# Patient Record
Sex: Male | Born: 1950 | Race: Black or African American | Hispanic: No | Marital: Married | Smoking: Never smoker
Health system: Southern US, Community
[De-identification: ages and names within clinical notes are randomized; demographics above are authoritative.]

## PROBLEM LIST (undated history)

## (undated) DIAGNOSIS — E079 Disorder of thyroid, unspecified: Secondary | ICD-10-CM

## (undated) DIAGNOSIS — E039 Hypothyroidism, unspecified: Secondary | ICD-10-CM

## (undated) DIAGNOSIS — H409 Unspecified glaucoma: Secondary | ICD-10-CM

## (undated) DIAGNOSIS — I499 Cardiac arrhythmia, unspecified: Secondary | ICD-10-CM

## (undated) DIAGNOSIS — H548 Legal blindness, as defined in USA: Secondary | ICD-10-CM

## (undated) DIAGNOSIS — I10 Essential (primary) hypertension: Secondary | ICD-10-CM

## (undated) DIAGNOSIS — H5410 Blindness, one eye, low vision other eye, unspecified eyes: Secondary | ICD-10-CM

## (undated) DIAGNOSIS — E119 Type 2 diabetes mellitus without complications: Secondary | ICD-10-CM

## (undated) DIAGNOSIS — E538 Deficiency of other specified B group vitamins: Secondary | ICD-10-CM

## (undated) DIAGNOSIS — K219 Gastro-esophageal reflux disease without esophagitis: Secondary | ICD-10-CM

## (undated) DIAGNOSIS — M1990 Unspecified osteoarthritis, unspecified site: Secondary | ICD-10-CM

## (undated) DIAGNOSIS — G4733 Obstructive sleep apnea (adult) (pediatric): Secondary | ICD-10-CM

## (undated) DIAGNOSIS — E7800 Pure hypercholesterolemia, unspecified: Secondary | ICD-10-CM

## (undated) HISTORY — PX: NOSE SURGERY: SHX723

---

## 2001-01-01 ENCOUNTER — Ambulatory Visit (HOSPITAL_BASED_OUTPATIENT_CLINIC_OR_DEPARTMENT_OTHER): Admission: RE | Admit: 2001-01-01 | Discharge: 2001-01-01 | Payer: Self-pay | Admitting: Pulmonary Disease

## 2001-08-21 ENCOUNTER — Encounter: Admission: RE | Admit: 2001-08-21 | Discharge: 2001-11-19 | Payer: Self-pay | Admitting: Internal Medicine

## 2004-03-28 ENCOUNTER — Ambulatory Visit: Payer: Self-pay | Admitting: Internal Medicine

## 2004-06-27 ENCOUNTER — Ambulatory Visit: Payer: Self-pay | Admitting: Internal Medicine

## 2004-07-08 ENCOUNTER — Ambulatory Visit: Payer: Self-pay | Admitting: Internal Medicine

## 2004-10-10 ENCOUNTER — Ambulatory Visit: Payer: Self-pay | Admitting: Internal Medicine

## 2004-10-12 ENCOUNTER — Ambulatory Visit: Payer: Self-pay | Admitting: Internal Medicine

## 2005-01-10 ENCOUNTER — Ambulatory Visit: Payer: Self-pay | Admitting: Internal Medicine

## 2005-01-17 ENCOUNTER — Ambulatory Visit: Payer: Self-pay | Admitting: Internal Medicine

## 2005-04-18 ENCOUNTER — Ambulatory Visit: Payer: Self-pay | Admitting: Internal Medicine

## 2005-08-03 ENCOUNTER — Ambulatory Visit: Payer: Self-pay | Admitting: Internal Medicine

## 2005-08-04 ENCOUNTER — Ambulatory Visit: Payer: Self-pay | Admitting: Internal Medicine

## 2005-10-31 ENCOUNTER — Ambulatory Visit: Payer: Self-pay | Admitting: Internal Medicine

## 2005-12-07 ENCOUNTER — Ambulatory Visit: Payer: Self-pay | Admitting: Pulmonary Disease

## 2006-02-05 ENCOUNTER — Emergency Department (HOSPITAL_COMMUNITY): Admission: EM | Admit: 2006-02-05 | Discharge: 2006-02-05 | Payer: Self-pay | Admitting: Emergency Medicine

## 2006-02-26 ENCOUNTER — Ambulatory Visit: Payer: Self-pay | Admitting: Internal Medicine

## 2006-05-28 ENCOUNTER — Ambulatory Visit (HOSPITAL_COMMUNITY): Admission: RE | Admit: 2006-05-28 | Discharge: 2006-05-29 | Payer: Self-pay | Admitting: Otolaryngology

## 2006-06-05 ENCOUNTER — Ambulatory Visit: Payer: Self-pay | Admitting: Internal Medicine

## 2006-06-05 LAB — CONVERTED CEMR LAB
ALT: 15 units/L (ref 0–40)
AST: 18 units/L (ref 0–37)
Albumin: 3.1 g/dL — ABNORMAL LOW (ref 3.5–5.2)
Alkaline Phosphatase: 48 units/L (ref 39–117)
GFR calc Af Amer: 81 mL/min
GFR calc non Af Amer: 67 mL/min
Glucose, Bld: 111 mg/dL — ABNORMAL HIGH (ref 70–99)
HDL: 35.2 mg/dL — ABNORMAL LOW (ref >39.0)
Sodium: 142 meq/L (ref 135–145)
Total Bilirubin: 0.4 mg/dL (ref 0.3–1.2)
Total CHOL/HDL Ratio: 3.9
Triglycerides: 82 mg/dL (ref 0–149)
VLDL: 16 mg/dL (ref 0–40)

## 2006-06-07 ENCOUNTER — Ambulatory Visit: Payer: Self-pay | Admitting: Internal Medicine

## 2006-07-19 ENCOUNTER — Ambulatory Visit: Payer: Self-pay | Admitting: Pulmonary Disease

## 2006-09-05 ENCOUNTER — Ambulatory Visit: Payer: Self-pay | Admitting: Internal Medicine

## 2006-09-05 LAB — CONVERTED CEMR LAB
Albumin: 3.1 g/dL — ABNORMAL LOW (ref 3.5–5.2)
Bilirubin, Direct: 0.1 mg/dL (ref 0.0–0.3)
CO2: 32 meq/L (ref 19–32)
Chloride: 106 meq/L (ref 96–112)
Cholesterol: 170 mg/dL (ref 0–200)
Creatinine, Ser: 1 mg/dL (ref 0.4–1.5)
GFR calc Af Amer: 100 mL/min
Glucose, Bld: 97 mg/dL (ref 70–99)
Hgb A1c MFr Bld: 5.9 % (ref 4.6–6.0)
LDL Cholesterol: 120 mg/dL — ABNORMAL HIGH (ref 0–99)
PSA: 1.34 ng/mL (ref 0.10–4.00)
Potassium: 3.8 meq/L (ref 3.5–5.1)
Sodium: 142 meq/L (ref 135–145)
Total Bilirubin: 0.9 mg/dL (ref 0.3–1.2)
Total CHOL/HDL Ratio: 4.6
Triglycerides: 67 mg/dL (ref 0–149)
VLDL: 13 mg/dL (ref 0–40)
Vit D, 1,25-Dihydroxy: 17 — ABNORMAL LOW (ref 20–57)

## 2006-09-18 ENCOUNTER — Ambulatory Visit: Payer: Self-pay | Admitting: Pulmonary Disease

## 2007-03-05 ENCOUNTER — Telehealth: Payer: Self-pay | Admitting: Internal Medicine

## 2007-03-06 ENCOUNTER — Encounter: Payer: Self-pay | Admitting: Internal Medicine

## 2007-03-28 ENCOUNTER — Encounter: Payer: Self-pay | Admitting: Internal Medicine

## 2007-03-28 DIAGNOSIS — E785 Hyperlipidemia, unspecified: Secondary | ICD-10-CM | POA: Insufficient documentation

## 2007-03-28 DIAGNOSIS — G4733 Obstructive sleep apnea (adult) (pediatric): Secondary | ICD-10-CM | POA: Insufficient documentation

## 2007-03-28 DIAGNOSIS — E039 Hypothyroidism, unspecified: Secondary | ICD-10-CM | POA: Insufficient documentation

## 2007-03-28 DIAGNOSIS — I10 Essential (primary) hypertension: Secondary | ICD-10-CM | POA: Insufficient documentation

## 2007-03-28 DIAGNOSIS — G4730 Sleep apnea, unspecified: Secondary | ICD-10-CM | POA: Insufficient documentation

## 2007-03-28 DIAGNOSIS — E119 Type 2 diabetes mellitus without complications: Secondary | ICD-10-CM | POA: Insufficient documentation

## 2007-09-20 ENCOUNTER — Telehealth: Payer: Self-pay | Admitting: Internal Medicine

## 2007-09-23 ENCOUNTER — Telehealth: Payer: Self-pay | Admitting: Internal Medicine

## 2007-11-14 ENCOUNTER — Encounter (INDEPENDENT_AMBULATORY_CARE_PROVIDER_SITE_OTHER): Payer: Self-pay | Admitting: *Deleted

## 2007-11-14 ENCOUNTER — Telehealth (INDEPENDENT_AMBULATORY_CARE_PROVIDER_SITE_OTHER): Payer: Self-pay | Admitting: *Deleted

## 2008-02-26 ENCOUNTER — Encounter: Admission: RE | Admit: 2008-02-26 | Discharge: 2008-02-26 | Payer: Self-pay | Admitting: Family Medicine

## 2008-02-27 ENCOUNTER — Encounter: Admission: RE | Admit: 2008-02-27 | Discharge: 2008-02-27 | Payer: Self-pay | Admitting: Family Medicine

## 2008-09-18 ENCOUNTER — Encounter: Payer: Self-pay | Admitting: Internal Medicine

## 2010-09-23 NOTE — Assessment & Plan Note (Signed)
[facility]                               PULMONARY OFFICE NOTE   NAME:Bass, Scott LEHNEN                      MRN:          [id number]  DATE:12/07/2005                            DOB:          May 28, 1950    SUBJECTIVE:  Scott Bass is sent back by Dr. Posey Rea for further  evaluation of his obstructive sleep apnea.  I initially saw him in 2002  after he was diagnosed with moderate to severe obstructive sleep apnea with  a respiratory disturbance index of 35 events per hour.  The patient was  started on CPAP and asked to return in four weeks for further evaluation.  The patient was never seen again, and now comes back with ongoing symptoms.  The patient was not able to tolerate CPAP secondary to the mask being on his  face.  He states that it was confining for him and interfered with his body  movement during sleep.  He also has been having a lot of nasal congestion  which prevented him from using the CPAP mask on a consistent basis.  This is  also a major problem during the day.  The patient continues to have  significant daytime sleepiness, and his weight has actually gone up since  the last visit.   PHYSICAL EXAMINATION:  GENERAL:  Obese, black male in no acute distress.  VITAL SIGNS:  Blood pressure 110/74, pulse 56, temperature 97.7, weight 233  pounds, O2 saturation on room air is 100%.  HEENT:  Nares reveal near total obstruction secondary to increased  turbinates and septal deviation.  Oropharynx shows elongation of soft palate  and uvula.   IMPRESSION:  Moderate to severe obstructive sleep apnea in 2002 which  currently is untreated.  The patient has gained weight, and therefore,  likely has worsening of his sleep apnea.  I tried to explain to him his best  treatment options are weight loss coupled with CPAP.  He does have  obstructive nares and does not want to use a full face mask which is too  confining for him.  Perhaps if we can  improve his nasal airway, he would do  well with nasal pillows and lower pressure requirements.  This would also  help his symptoms of chronic nasal congestion during the day.  The patient  is agreeable to this approach.   PLAN:  1.  Will refer to [facility] for consideration of nasal surgery.  2.  Once this heals, I would reinitiate CPAP with nasal pillows.  3.  Work on weight loss.                                   Barbaraann Share, MD, FCCP   [facility]/MedQ  DD:  12/07/2005  DT:  12/07/2005  Job #:  [id number]   cc:   Sonda Primes, MD  [facility]

## 2010-09-23 NOTE — Op Note (Signed)
Scott Bass, Scott Bass               ACCOUNT NO.:  [id number]   MEDICAL RECORD NO.:  [id number]          PATIENT TYPE:  OIB   LOCATION:  2550                         FACILITY:  [facility]   PHYSICIAN:  Antony Contras, MD     DATE OF BIRTH:  1950/09/05   DATE OF PROCEDURE:  05/28/2006  DATE OF DISCHARGE:                               OPERATIVE REPORT   PREOPERATIVE DIAGNOSIS:  1. Septal deviation.  2. Turbinate hypertrophy.  3. Obstructive sleep apnea.   POSTOPERATIVE DIAGNOSES:  1. Septal deviation.  2. Turbinate hypertrophy.  3. Obstructive sleep apnea.   PROCEDURE:  1. Septoplasty.  2. Bilateral turbinate submucous resection.   SURGEON:  Antony Contras, MD   ANESTHESIA:  General endotracheal anesthesia.   COMPLICATIONS:  None.   INDICATIONS:  The patient is a 60 year old African-American male with a  5-year history of obstructive sleep apnea.  He was tried on CPAP  initially, but had a difficult time tolerating the mask.  His nose  congests every night.  In the office, he is found to have turbinate  enlargement and deviation of his nasal septum significantly to the left  side.  In order to help him tolerate CPAP therapy, he presents to the  operating room for nasal surgery.   FINDINGS:  The septum deviates significantly to the left side,  obstructing the left nasal passage.  Turbinates are enlarged  bilaterally.   DESCRIPTION OF PROCEDURE:  The patient was identified in the holding  room and informed consent having been obtained, the patient was removed  to the operative suite and put on the operating table in the supine  position.  Anesthesia was induced and the patient was intubated by the  anesthesia team using a GlideScope.  The patient was given intravenous  antibiotics during the case.  The eyes were taped closed and the face  was prepped and draped in the sterile fashion.  Afrin-soaked pledgets  were placed on both sides of the nose for several minutes.  The  pledgets  were then removed.  Nasal hairs were cut.  The septum was then injected  with 1% lidocaine with 1:100,000 of epinephrine on both sides.  A  cheilion incision was then made on the left side using a 15 blade  scalpel through the perichondrium.  A subperichondrial flap was then  elevated using a caudal and then [employer].  This was elevated  back beyond the bony cartilaginous junction.  The cartilage was then  divided using a Freer elevator anteriorly, leaving about a 1.5-cm caudal  strut.  The dorsal strut was left at about 1.5 cm as well.  The Freer  elevator was then used to elevate the soft tissues off the opposite side  of the cartilage and the bony cartilaginous junction was then divided.  The portion of cartilage was then removed.  The subperiosteal flaps were  then elevated posteriorly on both sides and the posterior bone was then  removed in a piecemeal fashion.  Inferior cartilage was removed using a  Takahashi forceps and the inferior bony septum was also removed in  a  piecemeal fashion.  An additional segment of cartilage anteriorly was  removed due to continued deviation, leaving about a 1-cm strut caudally.  This allowed the septum to sit in the midline well.  A large hole was  made in the left septal flap during removal of cartilage and bone.  A  couple of small holes were made on the opposite side that were not  directly across from the large hole.  The inferior turbinates were then  injected with 1% lidocaine with 1:100,000 of epinephrine on both sides.  A 15-blade scalpel was used to make a stab incision at the anterior head  of the turbinate.  The soft tissues were then elevated off the  underlying bone using a Therapist, nutritional on both sides.  The 0-degree  sinus blade was then used on the microdebrider to remove submucosal  tissue on both sides, reducing the turbinates effectively.  The mucosa  was then laid back down on the turbinates.  The nose was then  suctioned  out and Doyle splints coated in bacitracin ointment were placed in both  nasal passages.  The cheilion incision was closed with 4-0 chromic  suture in a simple interrupted fashion.  The stents were then placed  into proper position and secured with a through-and-through 2-0 chromic  mattress stitch.  The nose and throat were then suctioned out and the  patient was then turned back to Anesthesia for wake-up.  He was  extubated and removed to the recovery room in stable condition.      Antony Contras, MD  Electronically Signed     DDB/MEDQ  D:  05/28/2006  T:  05/28/2006  Job:  [id number]

## 2011-01-10 ENCOUNTER — Encounter: Payer: Self-pay | Admitting: Internal Medicine

## 2011-01-13 ENCOUNTER — Ambulatory Visit: Payer: Self-pay | Admitting: Cardiovascular Disease

## 2011-02-02 ENCOUNTER — Ambulatory Visit: Payer: Self-pay | Admitting: Internal Medicine

## 2011-03-01 ENCOUNTER — Ambulatory Visit (HOSPITAL_COMMUNITY): Admission: RE | Admit: 2011-03-01 | Payer: Self-pay | Source: Ambulatory Visit | Admitting: Ophthalmology

## 2013-01-27 ENCOUNTER — Other Ambulatory Visit: Payer: Self-pay | Admitting: Family Medicine

## 2013-01-27 DIAGNOSIS — Z1211 Encounter for screening for malignant neoplasm of colon: Secondary | ICD-10-CM

## 2013-01-31 ENCOUNTER — Other Ambulatory Visit: Payer: Self-pay

## 2013-02-12 ENCOUNTER — Ambulatory Visit
Admission: RE | Admit: 2013-02-12 | Discharge: 2013-02-12 | Disposition: A | Discharge status: Home / Self Care | Payer: BC Managed Care – PPO | Source: Ambulatory Visit | Attending: Family Medicine | Admitting: Family Medicine

## 2013-02-12 ENCOUNTER — Other Ambulatory Visit: Payer: Self-pay | Admitting: Family Medicine

## 2013-02-12 DIAGNOSIS — Z1211 Encounter for screening for malignant neoplasm of colon: Secondary | ICD-10-CM

## 2015-02-16 ENCOUNTER — Emergency Department (HOSPITAL_COMMUNITY): Payer: BLUE CROSS/BLUE SHIELD

## 2015-02-16 ENCOUNTER — Encounter (HOSPITAL_COMMUNITY): Payer: Self-pay | Admitting: Emergency Medicine

## 2015-02-16 ENCOUNTER — Inpatient Hospital Stay (HOSPITAL_COMMUNITY)
Admission: EM | Admit: 2015-02-16 | Discharge: 2015-02-18 | DRG: 440 | Disposition: A | Discharge status: Home / Self Care | Payer: BLUE CROSS/BLUE SHIELD | Attending: Internal Medicine | Admitting: Internal Medicine

## 2015-02-16 DIAGNOSIS — H548 Legal blindness, as defined in USA: Secondary | ICD-10-CM | POA: Diagnosis present

## 2015-02-16 DIAGNOSIS — D649 Anemia, unspecified: Secondary | ICD-10-CM | POA: Diagnosis present

## 2015-02-16 DIAGNOSIS — K8500 Idiopathic acute pancreatitis without necrosis or infection: Secondary | ICD-10-CM | POA: Diagnosis not present

## 2015-02-16 DIAGNOSIS — I10 Essential (primary) hypertension: Secondary | ICD-10-CM | POA: Diagnosis present

## 2015-02-16 DIAGNOSIS — R1013 Epigastric pain: Secondary | ICD-10-CM | POA: Diagnosis not present

## 2015-02-16 DIAGNOSIS — Z9119 Patient's noncompliance with other medical treatment and regimen: Secondary | ICD-10-CM

## 2015-02-16 DIAGNOSIS — K859 Acute pancreatitis without necrosis or infection, unspecified: Secondary | ICD-10-CM | POA: Diagnosis present

## 2015-02-16 DIAGNOSIS — G4733 Obstructive sleep apnea (adult) (pediatric): Secondary | ICD-10-CM | POA: Diagnosis present

## 2015-02-16 DIAGNOSIS — E119 Type 2 diabetes mellitus without complications: Secondary | ICD-10-CM

## 2015-02-16 DIAGNOSIS — E039 Hypothyroidism, unspecified: Secondary | ICD-10-CM | POA: Diagnosis present

## 2015-02-16 HISTORY — DX: Cardiac arrhythmia, unspecified: I49.9

## 2015-02-16 HISTORY — DX: Essential (primary) hypertension: I10

## 2015-02-16 HISTORY — DX: Legal blindness, as defined in USA: H54.8

## 2015-02-16 HISTORY — DX: Disorder of thyroid, unspecified: E07.9

## 2015-02-16 LAB — CBC
HCT: 36.7 % — ABNORMAL LOW (ref 39.0–52.0)
HEMOGLOBIN: 12.2 g/dL — AB (ref 13.0–17.0)
MCH: 28.8 pg (ref 26.0–34.0)
MCHC: 33.2 g/dL (ref 30.0–36.0)
MCV: 86.8 fL (ref 78.0–100.0)
PLATELETS: 299 10*3/uL (ref 150–400)
RBC: 4.23 MIL/uL (ref 4.22–5.81)
RDW: 14.4 % (ref 11.5–15.5)
WBC: 12.6 10*3/uL — AB (ref 4.0–10.5)

## 2015-02-16 LAB — COMPREHENSIVE METABOLIC PANEL
ALK PHOS: 57 U/L (ref 38–126)
ALT: 20 U/L (ref 17–63)
ANION GAP: 11 (ref 5–15)
AST: 26 U/L (ref 15–41)
Albumin: 3.3 g/dL — ABNORMAL LOW (ref 3.5–5.0)
BILIRUBIN TOTAL: 0.9 mg/dL (ref 0.3–1.2)
BUN: 8 mg/dL (ref 6–20)
CALCIUM: 9 mg/dL (ref 8.9–10.3)
CO2: 25 mmol/L (ref 22–32)
Chloride: 99 mmol/L — ABNORMAL LOW (ref 101–111)
Creatinine, Ser: 1.06 mg/dL (ref 0.61–1.24)
Glucose, Bld: 138 mg/dL — ABNORMAL HIGH (ref 65–99)
Potassium: 3.2 mmol/L — ABNORMAL LOW (ref 3.5–5.1)
SODIUM: 135 mmol/L (ref 135–145)
TOTAL PROTEIN: 7.7 g/dL (ref 6.5–8.1)

## 2015-02-16 LAB — LIPASE, BLOOD: Lipase: 80 U/L — ABNORMAL HIGH (ref 22–51)

## 2015-02-16 MED ORDER — MORPHINE SULFATE (PF) 4 MG/ML IV SOLN
4.0000 mg | Freq: Once | INTRAVENOUS | Status: AC
  Administered 2015-02-17: 4 mg via INTRAVENOUS
  Filled 2015-02-16: qty 1

## 2015-02-16 MED ORDER — ONDANSETRON HCL 4 MG/2ML IJ SOLN
4.0000 mg | Freq: Once | INTRAMUSCULAR | Status: AC
  Administered 2015-02-17: 4 mg via INTRAVENOUS
  Filled 2015-02-16: qty 2

## 2015-02-16 MED ORDER — SODIUM CHLORIDE 0.9 % IV BOLUS (SEPSIS)
1000.0000 mL | Freq: Once | INTRAVENOUS | Status: AC
  Administered 2015-02-17: 1000 mL via INTRAVENOUS

## 2015-02-16 NOTE — ED Notes (Signed)
Pt reports mid abdominal pain x 3 days. Sent here to r/o pancreatitis. No bm in 3 days. Denies nv.

## 2015-02-16 NOTE — ED Provider Notes (Signed)
CSN: [id number]     Arrival date & time 02/16/15  1911 History   First MD Initiated Contact with Patient 02/16/15 2305     Chief Complaint  Patient presents with  . Abdominal Pain     (Consider location/radiation/quality/duration/timing/severity/associated sxs/prior Treatment) HPI Comments: Patient is a 64 year old male with a past medical history of thyroid disease, hypertension, and diabetes who presents with abdominal pain that started 3 days ago. The pain is located in the epigastrium and LLQ and does not radiate. The pain is described as aching and severe. The pain started gradually and progressively worsened since the onset. No alleviating/aggravating factors. The patient has tried nothing for symptoms without relief. Associated symptoms include constipation. He reports not having a bowel movement in 3 days. Patient denies fever, headache, NVD, chest pain, SOB, dysuria. No history of abdominal surgery.      Past Medical History  Diagnosis Date  . Thyroid disease   . Hypertension   . Sleep apnea   . Irregular heartbeat   . Diabetes mellitus without complication (HCC)   . Legally blind    Past Surgical History  Procedure Laterality Date  . Nose surgery     No family history on file. Social History  Substance Use Topics  . Smoking status: Never Smoker   . Smokeless tobacco: None  . Alcohol Use: No    Review of Systems  Gastrointestinal: Positive for abdominal pain and constipation.  All other systems reviewed and are negative.     Allergies  Review of patient's allergies indicates no known allergies.  Home Medications   Prior to Admission medications   Not on File   BP 163/88 mmHg  Pulse 96  Temp(Src) 99 F (37.2 C) (Oral)  Resp 18  Ht  (1.702 m)  Wt 220 lb 9 oz (100.046 kg)  BMI 34.54 kg/m2  SpO2 97% Physical Exam  Constitutional: He is oriented to person, place, and time. He appears well-developed and well-nourished. No distress.  HENT:  Head:  Normocephalic and atraumatic.  Eyes: Conjunctivae and EOM are normal.  Neck: Normal range of motion.  Cardiovascular: Normal rate and regular rhythm.  Exam reveals no gallop and no friction rub.   No murmur heard. Pulmonary/Chest: Effort normal and breath sounds normal. He has no wheezes. He has no rales. He exhibits no tenderness.  Abdominal: Soft. He exhibits no distension. There is tenderness. There is no rebound.  Epigastric and LLQ tenderness to palpation. No other focal tenderness or peritoneal signs.   Musculoskeletal: Normal range of motion.  Neurological: He is alert and oriented to person, place, and time. Coordination normal.  Speech is goal-oriented. Moves limbs without ataxia.   Skin: Skin is warm and dry.  Psychiatric: He has a normal mood and affect. His behavior is normal.  Nursing note and vitals reviewed.   ED Course  Procedures (including critical care time) Labs Review Labs Reviewed  LIPASE, BLOOD - Abnormal; Notable for the following:    Lipase 80 (*)    All other components within normal limits  COMPREHENSIVE METABOLIC PANEL - Abnormal; Notable for the following:    Potassium 3.2 (*)    Chloride 99 (*)    Glucose, Bld 138 (*)    Albumin 3.3 (*)    All other components within normal limits  CBC - Abnormal; Notable for the following:    WBC 12.6 (*)    Hemoglobin 12.2 (*)    HCT 36.7 (*)    All  other components within normal limits  URINALYSIS, ROUTINE W REFLEX MICROSCOPIC (NOT AT [facility]) - Abnormal; Notable for the following:    Ketones, ur 40 (*)    All other components within normal limits    Imaging Review Ct Abdomen Pelvis W Contrast  02/17/2015  CLINICAL DATA:  64 year old male with left lower quadrant and epigastric pain. Initial encounter. EXAM: CT ABDOMEN AND PELVIS WITH CONTRAST TECHNIQUE: Multidetector CT imaging of the abdomen and pelvis was performed using the standard protocol following bolus administration of intravenous contrast. CONTRAST:   100 mL Omnipaque 300 COMPARISON:  Barium enema 02/12/2013. FINDINGS: No pericardial or pleural effusion. Minor lower lobe atelectasis or scarring. Degenerative changes in the spine. No acute osseous abnormality identified. No pelvic free fluid. Negative rectum. Early IV contrast excretion at the ureterovesical junctions, otherwise unremarkable urinary bladder. Decompressed sigmoid and left colon. Inflammation from the lesser sac abutting the splenic flexure which otherwise appears negative. Mildly gas and stool distended more proximal colon. Negative appendix. No dilated small bowel. Small gastric hiatal hernia. Largely decompressed stomach and duodenum. Decreased density in the liver in keeping with steatosis. Otherwise negative liver, gallbladder, spleen, adrenal glands. Renal enhancement and contrast excretion within normal limits. Major arterial structures are patent. Minimal iliac artery atherosclerosis. Portal venous system including the splenic vein is patent. No abdominal free air or free fluid. Inflammation surrounding the pancreas, more so the tail and body. Underlying fatty infiltration of the pancreas. Thickening of the left para renal space Foss she a and lateral [employer] show. No fluid collection. IMPRESSION: 1. Acute pancreatitis primarily affecting the body and tail with no complicating features at this time. 2. No other acute process in the abdomen or pelvis. Hepatic steatosis. Electronically Signed   By: Odessa Fleming M.D.   On: 02/17/2015 02:27   I have personally reviewed and evaluated these images and lab results as part of my medical decision-making.   EKG Interpretation None      MDM   Final diagnoses:  Idiopathic acute pancreatitis    11:05 PM Patient's labs show elevated lipase. Remaining labs stable. Vitals stable and patient afebrile.   3:36 AM Patient's CT shows pancreatitis without other acute changes. Patient's pain controlled with morphine. Patient will be admitted for  bowel rest and observation.     Emilia Beck, PA-C 02/17/15 5462  Linwood Dibbles, MD 02/18/15 2207

## 2015-02-17 ENCOUNTER — Encounter (HOSPITAL_COMMUNITY): Payer: Self-pay | Admitting: Internal Medicine

## 2015-02-17 ENCOUNTER — Observation Stay (HOSPITAL_COMMUNITY): Payer: BLUE CROSS/BLUE SHIELD

## 2015-02-17 ENCOUNTER — Inpatient Hospital Stay (HOSPITAL_COMMUNITY): Payer: BLUE CROSS/BLUE SHIELD

## 2015-02-17 DIAGNOSIS — H548 Legal blindness, as defined in USA: Secondary | ICD-10-CM | POA: Diagnosis present

## 2015-02-17 DIAGNOSIS — K8500 Idiopathic acute pancreatitis without necrosis or infection: Secondary | ICD-10-CM | POA: Diagnosis not present

## 2015-02-17 DIAGNOSIS — E119 Type 2 diabetes mellitus without complications: Secondary | ICD-10-CM

## 2015-02-17 DIAGNOSIS — E039 Hypothyroidism, unspecified: Secondary | ICD-10-CM

## 2015-02-17 DIAGNOSIS — D649 Anemia, unspecified: Secondary | ICD-10-CM | POA: Diagnosis present

## 2015-02-17 DIAGNOSIS — R1013 Epigastric pain: Secondary | ICD-10-CM | POA: Diagnosis present

## 2015-02-17 DIAGNOSIS — I10 Essential (primary) hypertension: Secondary | ICD-10-CM | POA: Diagnosis present

## 2015-02-17 DIAGNOSIS — Z9119 Patient's noncompliance with other medical treatment and regimen: Secondary | ICD-10-CM | POA: Diagnosis not present

## 2015-02-17 DIAGNOSIS — G4733 Obstructive sleep apnea (adult) (pediatric): Secondary | ICD-10-CM | POA: Diagnosis present

## 2015-02-17 DIAGNOSIS — K859 Acute pancreatitis without necrosis or infection, unspecified: Secondary | ICD-10-CM | POA: Diagnosis present

## 2015-02-17 LAB — COMPREHENSIVE METABOLIC PANEL
ALK PHOS: 48 U/L (ref 38–126)
ALT: 18 U/L (ref 17–63)
ANION GAP: 14 (ref 5–15)
AST: 22 U/L (ref 15–41)
Albumin: 2.7 g/dL — ABNORMAL LOW (ref 3.5–5.0)
BILIRUBIN TOTAL: 0.8 mg/dL (ref 0.3–1.2)
BUN: 7 mg/dL (ref 6–20)
CALCIUM: 8.8 mg/dL — AB (ref 8.9–10.3)
CO2: 26 mmol/L (ref 22–32)
CREATININE: 1.07 mg/dL (ref 0.61–1.24)
Chloride: 99 mmol/L — ABNORMAL LOW (ref 101–111)
GFR calc non Af Amer: >60 mL/min (ref >60)
GLUCOSE: 132 mg/dL — AB (ref 65–99)
Potassium: 3.4 mmol/L — ABNORMAL LOW (ref 3.5–5.1)
Sodium: 139 mmol/L (ref 135–145)
TOTAL PROTEIN: 7 g/dL (ref 6.5–8.1)

## 2015-02-17 LAB — URINALYSIS, ROUTINE W REFLEX MICROSCOPIC
Bilirubin Urine: NEGATIVE
Glucose, UA: NEGATIVE mg/dL
Hgb urine dipstick: NEGATIVE
KETONES UR: 40 mg/dL — AB
LEUKOCYTES UA: NEGATIVE
Nitrite: NEGATIVE
PROTEIN: NEGATIVE mg/dL
Specific Gravity, Urine: 1.018 (ref 1.005–1.030)
UROBILINOGEN UA: 1 mg/dL (ref 0.0–1.0)
pH: 5.5 (ref 5.0–8.0)

## 2015-02-17 LAB — CBC WITH DIFFERENTIAL/PLATELET
BASOS ABS: 0 10*3/uL (ref 0.0–0.1)
BASOS PCT: 0 %
EOS ABS: 0.1 10*3/uL (ref 0.0–0.7)
Eosinophils Relative: 1 %
HEMATOCRIT: 33.5 % — AB (ref 39.0–52.0)
Hemoglobin: 11.4 g/dL — ABNORMAL LOW (ref 13.0–17.0)
Lymphocytes Relative: 13 %
Lymphs Abs: 1.6 10*3/uL (ref 0.7–4.0)
MCH: 29.8 pg (ref 26.0–34.0)
MCHC: 34 g/dL (ref 30.0–36.0)
MCV: 87.7 fL (ref 78.0–100.0)
MONO ABS: 1.6 10*3/uL — AB (ref 0.1–1.0)
Monocytes Relative: 12 %
NEUTROS ABS: 9.6 10*3/uL — AB (ref 1.7–7.7)
NEUTROS PCT: 74 %
PLATELETS: 248 10*3/uL (ref 150–400)
RBC: 3.82 MIL/uL — ABNORMAL LOW (ref 4.22–5.81)
RDW: 14.7 % (ref 11.5–15.5)
WBC: 12.9 10*3/uL — ABNORMAL HIGH (ref 4.0–10.5)

## 2015-02-17 LAB — GLUCOSE, CAPILLARY
Glucose-Capillary: 112 mg/dL — ABNORMAL HIGH (ref 65–99)
Glucose-Capillary: 125 mg/dL — ABNORMAL HIGH (ref 65–99)
Glucose-Capillary: 124 mg/dL — ABNORMAL HIGH (ref 65–99)
Glucose-Capillary: 116 mg/dL — ABNORMAL HIGH (ref 65–99)

## 2015-02-17 LAB — LIPASE, BLOOD: LIPASE: 53 U/L — AB (ref 22–51)

## 2015-02-17 LAB — LIPID PANEL
CHOLESTEROL: 171 mg/dL (ref 0–200)
HDL: 49 mg/dL (ref >40)
LDL Cholesterol: 109 mg/dL — ABNORMAL HIGH (ref 0–99)
TRIGLYCERIDES: 65 mg/dL (ref <150)
Total CHOL/HDL Ratio: 3.5 RATIO
VLDL: 13 mg/dL (ref 0–40)

## 2015-02-17 MED ORDER — LEVOTHYROXINE SODIUM 150 MCG PO TABS
150.0000 ug | ORAL_TABLET | Freq: Every day | ORAL | Status: DC
  Administered 2015-02-17 – 2015-02-18 (×2): 150 ug via ORAL
  Filled 2015-02-17 (×2): qty 1

## 2015-02-17 MED ORDER — MORPHINE SULFATE (PF) 2 MG/ML IV SOLN
1.0000 mg | INTRAVENOUS | Status: DC | PRN
  Administered 2015-02-17: 1 mg via INTRAVENOUS
  Filled 2015-02-17: qty 1

## 2015-02-17 MED ORDER — ASPIRIN EC 81 MG PO TBEC
81.0000 mg | DELAYED_RELEASE_TABLET | Freq: Every day | ORAL | Status: DC
  Administered 2015-02-17 – 2015-02-18 (×2): 81 mg via ORAL
  Filled 2015-02-17 (×3): qty 1

## 2015-02-17 MED ORDER — LATANOPROST 0.005 % OP SOLN
1.0000 [drp] | Freq: Every day | OPHTHALMIC | Status: DC
  Administered 2015-02-17: 1 [drp] via OPHTHALMIC
  Filled 2015-02-17: qty 2.5

## 2015-02-17 MED ORDER — PANTOPRAZOLE SODIUM 40 MG PO TBEC
40.0000 mg | DELAYED_RELEASE_TABLET | Freq: Every day | ORAL | Status: DC
  Administered 2015-02-17 – 2015-02-18 (×2): 40 mg via ORAL
  Filled 2015-02-17 (×2): qty 1

## 2015-02-17 MED ORDER — CETYLPYRIDINIUM CHLORIDE 0.05 % MT LIQD
7.0000 mL | Freq: Two times a day (BID) | OROMUCOSAL | Status: DC
  Administered 2015-02-17 – 2015-02-18 (×2): 7 mL via OROMUCOSAL

## 2015-02-17 MED ORDER — POTASSIUM CHLORIDE IN NACL 20-0.9 MEQ/L-% IV SOLN
INTRAVENOUS | Status: DC
  Administered 2015-02-17 – 2015-02-18 (×4): via INTRAVENOUS
  Filled 2015-02-17 (×6): qty 1000

## 2015-02-17 MED ORDER — ENOXAPARIN SODIUM 40 MG/0.4ML ~~LOC~~ SOLN
40.0000 mg | SUBCUTANEOUS | Status: DC
  Administered 2015-02-17 – 2015-02-18 (×2): 40 mg via SUBCUTANEOUS
  Filled 2015-02-17 (×2): qty 0.4

## 2015-02-17 MED ORDER — ONDANSETRON HCL 4 MG/2ML IJ SOLN: 4.0000 mg | Freq: Four times a day (QID) | INTRAMUSCULAR | Status: DC | PRN

## 2015-02-17 MED ORDER — ACETAMINOPHEN 325 MG PO TABS: 650.0000 mg | ORAL_TABLET | Freq: Four times a day (QID) | ORAL | Status: DC | PRN

## 2015-02-17 MED ORDER — ACETAMINOPHEN 650 MG RE SUPP: 650.0000 mg | Freq: Four times a day (QID) | RECTAL | Status: DC | PRN

## 2015-02-17 MED ORDER — BRIMONIDINE TARTRATE 0.2 % OP SOLN
1.0000 [drp] | Freq: Every day | OPHTHALMIC | Status: DC
  Administered 2015-02-17: 1 [drp] via OPHTHALMIC
  Filled 2015-02-17: qty 5

## 2015-02-17 MED ORDER — INSULIN ASPART 100 UNIT/ML ~~LOC~~ SOLN: 0.0000 [IU] | Freq: Three times a day (TID) | SUBCUTANEOUS | Status: DC

## 2015-02-17 MED ORDER — SODIUM CHLORIDE 0.9 % IV SOLN
INTRAVENOUS | Status: DC
  Administered 2015-02-17: 07:00:00 via INTRAVENOUS

## 2015-02-17 MED ORDER — LOSARTAN POTASSIUM 50 MG PO TABS
100.0000 mg | ORAL_TABLET | Freq: Every day | ORAL | Status: DC
Start: 2015-02-17 — End: 2015-02-18
  Administered 2015-02-17 – 2015-02-18 (×2): 100 mg via ORAL
  Filled 2015-02-17 (×2): qty 2

## 2015-02-17 MED ORDER — CHLORHEXIDINE GLUCONATE 0.12 % MT SOLN
15.0000 mL | Freq: Two times a day (BID) | OROMUCOSAL | Status: DC
  Administered 2015-02-17 – 2015-02-18 (×3): 15 mL via OROMUCOSAL
  Filled 2015-02-17 (×3): qty 15

## 2015-02-17 MED ORDER — ONDANSETRON HCL 4 MG PO TABS: 4.0000 mg | ORAL_TABLET | Freq: Four times a day (QID) | ORAL | Status: DC | PRN

## 2015-02-17 MED ORDER — IOHEXOL 300 MG/ML  SOLN
100.0000 mL | Freq: Once | INTRAMUSCULAR | Status: AC | PRN
  Administered 2015-02-17: 100 mL via INTRAVENOUS

## 2015-02-17 MED ORDER — METOPROLOL TARTRATE 25 MG PO TABS
25.0000 mg | ORAL_TABLET | Freq: Every day | ORAL | Status: DC
  Administered 2015-02-17 – 2015-02-18 (×2): 25 mg via ORAL
  Filled 2015-02-17 (×2): qty 1

## 2015-02-17 NOTE — ED Notes (Signed)
Patient states that he is still unable to provide sample for urinalysis.

## 2015-02-17 NOTE — Progress Notes (Signed)
Patient arrived on unit via stretcher with nurse tech. Patient alert and oriented x4. Patient oriented to unit, staff and room. Patient's wife at bedside. Skin assessment completed with two RN's, old scabs noted. Patient rates pain 5/10. MD notified. Safety Fall Prevention Plan was given, discussed and signed by patient. Orders have been reviewed and implemented. Call light has been placed within reach. RN will continue to monitor the patient .   Rivka Barbara BSN, RN  Phone Number: [phone]

## 2015-02-17 NOTE — H&P (Signed)
[facility] History and Physical  FABION JEMMOTT PPJ:[id number] DOB: 04-Jun-1950 DOA: 02/16/2015  Referring physician: Ms.Kaitlyn. PCP: Willow Ora, MD  Specialists: None.  Chief Complaint: Abdominal pain.  HPI: Scott Bass is a 64 y.o. male medical history of hypertension, diabetes mellitus, sleep apnea, hypothyroidism and legally blind presents to the ER because of abdominal pain. Patient has been experiencing abdominal pain over the last 3-4 days. Denies any associated nausea vomiting or diarrhea. Pain was initially mostly in the epigastric area but has become more diffuse. Yesterday patient did not eat anything because of the pain. Pain is more of a dull constant pain nonradiating. Labs in the ER shows mildly elevated lipase with CT abdomen and pelvis showing features concerning for pancreatitis. Patient denies any alcohol abuse and has not had any recent medication changes. She has been admitted for acute pancreatitis.   Review of Systems: As presented in the history of presenting illness, rest negative.  Past Medical History  Diagnosis Date  . Thyroid disease   . Hypertension   . Sleep apnea   . Irregular heartbeat   . Diabetes mellitus without complication (HCC)   . Legally blind    Past Surgical History  Procedure Laterality Date  . Nose surgery     Social History:  reports that he has never smoked. He does not have any smokeless tobacco history on file. He reports that he does not drink alcohol or use illicit drugs. Where does patient live home. Can patient participate in ADLs? Yes.  No Known Allergies  Family History:  Family History  Problem Relation Age of Onset  . Diabetes Mellitus II Sister       Prior to Admission medications   Medication Sig Start Date End Date Taking? Authorizing Provider  aspirin 81 MG tablet Take 81 mg by mouth daily.   Yes Historical Provider, MD  brimonidine (ALPHAGAN) 0.15 % ophthalmic solution Place 1 drop into both eyes  at bedtime. 01/17/15  Yes Historical Provider, MD  levothyroxine (SYNTHROID, LEVOTHROID) 150 MCG tablet Take 150 mcg by mouth daily. 12/10/14  Yes Historical Provider, MD  losartan-hydrochlorothiazide (HYZAAR) 100-12.5 MG tablet Take 1 tablet by mouth daily. 01/18/15  Yes Historical Provider, MD  LUMIGAN 0.01 % SOLN Place 1 drop into both eyes at bedtime. 01/18/15  Yes Historical Provider, MD  metoprolol tartrate (LOPRESSOR) 25 MG tablet Take 25 mg by mouth daily. 01/18/15  Yes Historical Provider, MD  omeprazole (PRILOSEC) 40 MG capsule Take 40 mg by mouth daily. 02/24/14  Yes Historical Provider, MD    Physical Exam: Filed Vitals:   02/16/15 1936 02/16/15 2325 02/17/15 0344 02/17/15 0451  BP: 163/88 149/79 152/76 158/73  Pulse: 96 86 81 96  Temp: 99 F (37.2 C) 99.3 F (37.4 C)  98 F (36.7 C)  TempSrc: Oral Oral  Oral  Resp: 18 16 16 18   Height: 5\' 7"  (1.702 m)   5\' 7"  (1.702 m)  Weight: 100.046 kg (220 lb 9 oz)   98.5 kg (217 lb 2.5 oz)  SpO2: 97% 99% 95% 94%     General:  Moderately built and nourished.  Eyes: Anicteric no pallor.  ENT: No discharge from the ears eyes nose and mouth.  Neck: No mass felt.  Cardiovascular: S1 and S2 heard.  Respiratory: No rhonchi or crepitations.  Abdomen: Soft nontender bowel sounds present. No guarding or rigidity.  Skin: No rash.  Musculoskeletal: No edema.  Psychiatric: Appears normal.  Neurologic: Alert awake oriented to time place  and person. Moves all extremities.  Labs on Admission:  Basic Metabolic Panel:  Recent Labs Lab 02/16/15 1938  NA 135  K 3.2*  CL 99*  CO2 25  GLUCOSE 138*  BUN 8  CREATININE 1.06  CALCIUM 9.0   Liver Function Tests:  Recent Labs Lab 02/16/15 1938  AST 26  ALT 20  ALKPHOS 57  BILITOT 0.9  PROT 7.7  ALBUMIN 3.3*    Recent Labs Lab 02/16/15 1938  LIPASE 80*   No results for input(s): AMMONIA in the last 168 hours. CBC:  Recent Labs Lab 02/16/15 1938  WBC 12.6*  HGB  12.2*  HCT 36.7*  MCV 86.8  PLT 299   Cardiac Enzymes: No results for input(s): CKTOTAL, CKMB, CKMBINDEX, TROPONINI in the last 168 hours.  BNP (last 3 results) No results for input(s): BNP in the last 8760 hours.  ProBNP (last 3 results) No results for input(s): PROBNP in the last 8760 hours.  CBG: No results for input(s): GLUCAP in the last 168 hours.  Radiological Exams on Admission: Dg Chest 2 View  02/17/2015  CLINICAL DATA:  Abdominal pain. History of hypertension and diabetes. EXAM: CHEST  2 VIEW COMPARISON:  05/22/2006 FINDINGS: Shallow inspiration with atelectasis in the lung bases. Normal heart size and pulmonary vascularity. No focal airspace disease or consolidation in the lungs. No blunting of costophrenic angles. No pneumothorax. Mediastinal contours appear intact. Degenerative changes in the spine and shoulders. IMPRESSION: Shallow inspiration with atelectasis in the lung bases. Electronically Signed   By: Burman Nieves M.D.   On: 02/17/2015 04:12   Ct Abdomen Pelvis W Contrast  02/17/2015  CLINICAL DATA:  64 year old male with left lower quadrant and epigastric pain. Initial encounter. EXAM: CT ABDOMEN AND PELVIS WITH CONTRAST TECHNIQUE: Multidetector CT imaging of the abdomen and pelvis was performed using the standard protocol following bolus administration of intravenous contrast. CONTRAST:  100 mL Omnipaque 300 COMPARISON:  Barium enema 02/12/2013. FINDINGS: No pericardial or pleural effusion. Minor lower lobe atelectasis or scarring. Degenerative changes in the spine. No acute osseous abnormality identified. No pelvic free fluid. Negative rectum. Early IV contrast excretion at the ureterovesical junctions, otherwise unremarkable urinary bladder. Decompressed sigmoid and left colon. Inflammation from the lesser sac abutting the splenic flexure which otherwise appears negative. Mildly gas and stool distended more proximal colon. Negative appendix. No dilated small bowel.  Small gastric hiatal hernia. Largely decompressed stomach and duodenum. Decreased density in the liver in keeping with steatosis. Otherwise negative liver, gallbladder, spleen, adrenal glands. Renal enhancement and contrast excretion within normal limits. Major arterial structures are patent. Minimal iliac artery atherosclerosis. Portal venous system including the splenic vein is patent. No abdominal free air or free fluid. Inflammation surrounding the pancreas, more so the tail and body. Underlying fatty infiltration of the pancreas. Thickening of the left para renal space Foss she a and lateral [employer] show. No fluid collection. IMPRESSION: 1. Acute pancreatitis primarily affecting the body and tail with no complicating features at this time. 2. No other acute process in the abdomen or pelvis. Hepatic steatosis. Electronically Signed   By: Odessa Fleming M.D.   On: 02/17/2015 02:27     Assessment/Plan Principal Problem:   Pancreatitis, acute Active Problems:   Hypothyroidism   Pancreatitis   Diabetes mellitus type 2, controlled (HCC)   Essential hypertension   Acute pancreatitis   1. Acute pancreatitis - cause not clear. Will check lipid panel for triglycerides. CT scan does not show any definite  gallstones. Patient does not drink alcohol. Will hold off hydrochlorothiazide for now as possible cause. I have place patient nothing by mouth except medications. Continue with hydration. Pain relieving medications. 2. Hypertension - continue ARB. Holding HCTZ due to possible cause for pancreatitis. 3. Diabetes mellitus type 2 - [facility]. Patient is on sliding scale coverage. 4. Hypothyroidism on Synthroid. 5. OSA - patient states he is noncompliant with his sleep apnea. 6. Normocytic normochromic anemia - will need further workup as outpatient.  I have personally reviewed patient's chest x-ray.   DVT Prophylaxis Lovenox.  Code Status: Full code.  Family Communication: Patient's  wife at the bedside.  Disposition Plan: Admit to inpatient.    Bonnell Placzek N. [facility] Pager [phone].  If 7PM-7AM, please contact night-coverage [email or web address] Password [email or web address] 02/17/2015, 6:11 AM

## 2015-02-17 NOTE — ED Notes (Signed)
Patient transported to X-ray 

## 2015-02-17 NOTE — Progress Notes (Signed)
Utilization review completed. Giabella Duhart, RN, BSN. 

## 2015-02-17 NOTE — Progress Notes (Signed)
Patient seen and examined  64 year old male with a history of hypertension, diabetes, sleep apnea, hypothyroidism admitted with abdominal pain CT scan confirms pancreatitis Minimal abdominal pain this morning will advance to clear liquids Right upper quadrant ultrasound to rule out gallstones Triglycerides okay Replete potassium

## 2015-02-18 LAB — CBC
HEMATOCRIT: 33.9 % — AB (ref 39.0–52.0)
Hemoglobin: 11.3 g/dL — ABNORMAL LOW (ref 13.0–17.0)
MCH: 29.5 pg (ref 26.0–34.0)
MCHC: 33.3 g/dL (ref 30.0–36.0)
MCV: 88.5 fL (ref 78.0–100.0)
PLATELETS: 246 10*3/uL (ref 150–400)
RBC: 3.83 MIL/uL — AB (ref 4.22–5.81)
RDW: 14.8 % (ref 11.5–15.5)
WBC: 10.3 10*3/uL (ref 4.0–10.5)

## 2015-02-18 LAB — COMPREHENSIVE METABOLIC PANEL
ALBUMIN: 2.5 g/dL — AB (ref 3.5–5.0)
ALK PHOS: 49 U/L (ref 38–126)
ALT: 15 U/L — ABNORMAL LOW (ref 17–63)
AST: 21 U/L (ref 15–41)
Anion gap: 12 (ref 5–15)
BILIRUBIN TOTAL: 1.1 mg/dL (ref 0.3–1.2)
BUN: 7 mg/dL (ref 6–20)
CO2: 24 mmol/L (ref 22–32)
CREATININE: 1.05 mg/dL (ref 0.61–1.24)
Calcium: 8.5 mg/dL — ABNORMAL LOW (ref 8.9–10.3)
Chloride: 104 mmol/L (ref 101–111)
GFR calc Af Amer: >60 mL/min (ref >60)
GFR calc non Af Amer: >60 mL/min (ref >60)
Glucose, Bld: 108 mg/dL — ABNORMAL HIGH (ref 65–99)
POTASSIUM: 3.6 mmol/L (ref 3.5–5.1)
Sodium: 140 mmol/L (ref 135–145)
Total Protein: 6.8 g/dL (ref 6.5–8.1)

## 2015-02-18 LAB — LIPASE, BLOOD: LIPASE: 30 U/L (ref 22–51)

## 2015-02-18 LAB — GLUCOSE, CAPILLARY
GLUCOSE-CAPILLARY: 101 mg/dL — AB (ref 65–99)
Glucose-Capillary: 112 mg/dL — ABNORMAL HIGH (ref 65–99)

## 2015-02-18 MED ORDER — LOSARTAN POTASSIUM 100 MG PO TABS: 100.0000 mg | ORAL_TABLET | Freq: Every day | ORAL | Status: DC

## 2015-02-18 NOTE — Care Management Note (Signed)
Case Management Note  Patient Details  Name: Scott Bass MRN: [id number] Date of Birth: 03/24/1951  Subjective/Objective:      CM following for progression and d/c planning.              Action/Plan: No d/c needs identified, pt will d/c to home.  Expected Discharge Date:      02/18/2015            Expected Discharge Plan:  Home/Self Care  In-House Referral:  NA  Discharge planning Services  NA  Post Acute Care Choice:  NA Choice offered to:  NA  DME Arranged:  N/A DME Agency:  NA  HH Arranged:  NA HH Agency:  NA  Status of Service:  Completed, signed off  Medicare Important Message Given:    Date Medicare IM Given:    Medicare IM give by:    Date Additional Medicare IM Given:    Additional Medicare Important Message give by:     If discussed at Long Length of Stay Meetings, dates discussed:    Additional Comments:  Starlyn Skeans, RN 02/18/2015, 11:54 AM

## 2015-02-18 NOTE — Progress Notes (Signed)
Patient Discharge:  Disposition: Pt discharged home to family  Education: Pt educated on medications and all discharge instructions. Pt verbalized understanding.   IV: Removed  Telemetry:  N/A  Follow-up appointments:N/A  Prescriptions: Scripts sent to pt's pharmacy  Transportation: Pt took taxi home  Belongings:All belongings taken with pt.

## 2015-02-18 NOTE — Discharge Summary (Signed)
Physician Discharge Summary  Scott Bass MRN: [id number] DOB/AGE: 12-25-1950 64 y.o.  PCP: Leamon Arnt, MD   Admit date: 02/16/2015 Discharge date: 02/18/2015  Discharge Diagnoses:   Principal Problem:   Pancreatitis, acute Active Problems:   Hypothyroidism   Pancreatitis   Diabetes mellitus type 2, controlled ([place])   Essential hypertension   Acute pancreatitis    Follow-up recommendations Follow-up with PCP in 3-5 days , including all  additional recommended appointments as below Follow-up CBC, CMP in 3-5 days      Medication List    STOP taking these medications        losartan-hydrochlorothiazide 100-12.5 MG tablet  Commonly known as:  HYZAAR      TAKE these medications        aspirin 81 MG tablet  Take 81 mg by mouth daily.     brimonidine 0.15 % ophthalmic solution  Commonly known as:  ALPHAGAN  Place 1 drop into both eyes at bedtime.     levothyroxine 150 MCG tablet  Commonly known as:  SYNTHROID, LEVOTHROID  Take 150 mcg by mouth daily.     losartan 100 MG tablet  Commonly known as:  COZAAR  Take 1 tablet (100 mg total) by mouth daily.     LUMIGAN 0.01 % Soln  Generic drug:  bimatoprost  Place 1 drop into both eyes at bedtime.     metoprolol tartrate 25 MG tablet  Commonly known as:  LOPRESSOR  Take 25 mg by mouth daily.     omeprazole 40 MG capsule  Commonly known as:  PRILOSEC  Take 40 mg by mouth daily.         Discharge Condition: Stable   Home Disposition:    Consults:  None    Significant Diagnostic Studies:  Dg Chest 2 View  02/17/2015  CLINICAL DATA:  Abdominal pain. History of hypertension and diabetes. EXAM: CHEST  2 VIEW COMPARISON:  05/22/2006 FINDINGS: Shallow inspiration with atelectasis in the lung bases. Normal heart size and pulmonary vascularity. No focal airspace disease or consolidation in the lungs. No blunting of costophrenic angles. No pneumothorax. Mediastinal contours appear intact.  Degenerative changes in the spine and shoulders. IMPRESSION: Shallow inspiration with atelectasis in the lung bases. Electronically Signed   By: Lucienne Capers M.D.   On: 02/17/2015 04:12   Ct Abdomen Pelvis W Contrast  02/17/2015  CLINICAL DATA:  64 year old male with left left lower quadrant and epigastric pain. Initial encounter. EXAM: CT ABDOMEN AND PELVIS WITH CONTRAST TECHNIQUE: Multidetector CT imaging of the abdomen and pelvis was performed using the standard protocol following bolus administration of intravenous contrast. CONTRAST:  100 mL Omnipaque 300 COMPARISON:  Barium enema 02/12/2013. FINDINGS: No pericardial or pleural effusion. Minor lower lobe atelectasis or scarring. Degenerative changes in the spine. No acute osseous abnormality identified. No pelvic free fluid. Negative rectum. Early IV contrast excretion at the ureterovesical junctions, otherwise unremarkable urinary bladder. Decompressed sigmoid and left colon. Inflammation from the lesser sac abutting the splenic flexure which otherwise appears negative. Mildly gas and stool distended more proximal colon. Negative appendix. No dilated small bowel. Small gastric hiatal hernia. Largely decompressed stomach and duodenum. Decreased density in the liver in keeping with steatosis. Otherwise negative liver, gallbladder, spleen, adrenal glands. Renal enhancement and contrast excretion within normal limits. Major arterial structures are patent. Minimal iliac artery atherosclerosis. Portal venous system including the splenic vein is patent. No abdominal free air or free fluid. Inflammation surrounding the pancreas, more so the tail and  body. Underlying fatty infiltration of the pancreas. Thickening of the left para renal space Foss she a and lateral [employer] show. No fluid collection. IMPRESSION: 1. Acute pancreatitis primarily affecting the body and tail with no complicating features at this time. 2. No other acute process in the abdomen or pelvis.  Hepatic steatosis. Electronically Signed   By: Genevie Ann M.D.   On: 02/17/2015 02:27   US Abdomen Limited Ruq  02/17/2015  CLINICAL DATA:  Acute pancreatitis.  Mid abdominal pain for 3 days. EXAM: US ABDOMEN LIMITED - RIGHT UPPER QUADRANT COMPARISON:  CT abdomen and pelvis 02/17/2015 FINDINGS: Gallbladder: Gallbladder is somewhat contracted. This is likely physiologic. No gallstones or wall thickening visualized. No sonographic Murphy sign noted. Common bile duct: Diameter: 5.6 mm, normal Liver: Incomplete visualization due to overlying rib shadowing. No focal lesion identified. Within normal limits in parenchymal echogenicity. IMPRESSION: No evidence of cholelithiasis or cholecystitis. No bile duct dilatation. Electronically Signed   By: Lucienne Capers M.D.   On: 02/17/2015 21:10        Filed Weights   02/16/15 1936 02/17/15 0451 02/17/15 2151  Weight: 100.046 kg (220 lb 9 oz) 98.5 kg (217 lb 2.5 oz) 99.9 kg (220 lb 3.8 oz)     Microbiology: No results found for this or any previous visit (from the past 240 hour(s)).     Blood Culture No results found for: SDES, SPECREQUEST, CULT, REPTSTATUS    Labs: Results for orders placed or performed during the hospital encounter of 02/16/15 (from the past 48 hour(s))  Lipase, blood     Status: Abnormal   Collection Time: 02/16/15  7:38 PM  Result Value Ref Range   Lipase 80 (H) 22 - 51 U/L  Comprehensive metabolic panel     Status: Abnormal   Collection Time: 02/16/15  7:38 PM  Result Value Ref Range   Sodium 135 135 - 145 mmol/L   Potassium 3.2 (L) 3.5 - 5.1 mmol/L   Chloride 99 (L) 101 - 111 mmol/L   CO2 25 22 - 32 mmol/L   Glucose, Bld 138 (H) 65 - 99 mg/dL   BUN 8 6 - 20 mg/dL   Creatinine, Ser 1.06 0.61 - 1.24 mg/dL   Calcium 9.0 8.9 - 10.3 mg/dL   Total Protein 7.7 6.5 - 8.1 g/dL   Albumin 3.3 (L) 3.5 - 5.0 g/dL   AST 26 15 - 41 U/L   ALT 20 17 - 63 U/L   Alkaline Phosphatase 57 38 - 126 U/L   Total Bilirubin 0.9 0.3 -  1.2 mg/dL   GFR calc non Af Amer >60 >60 mL/min   GFR calc Af Amer >60 >60 mL/min    Comment: (NOTE) The eGFR has been calculated using the CKD EPI equation. This calculation has not been validated in all clinical situations. eGFR's persistently <60 mL/min signify possible Chronic Kidney Disease.    Anion gap 11 5 - 15  CBC     Status: Abnormal   Collection Time: 02/16/15  7:38 PM  Result Value Ref Range   WBC 12.6 (H) 4.0 - 10.5 K/uL   RBC 4.23 4.22 - 5.81 MIL/uL   Hemoglobin 12.2 (L) 13.0 - 17.0 g/dL   HCT 36.7 (L) 39.0 - 52.0 %   MCV 86.8 78.0 - 100.0 fL   MCH 28.8 26.0 - 34.0 pg   MCHC 33.2 30.0 - 36.0 g/dL   RDW 14.4 11.5 - 15.5 %   Platelets 299 150 - 400  K/uL  Urinalysis, Routine w reflex microscopic (not at [facility])     Status: Abnormal   Collection Time: 02/17/15 12:21 AM  Result Value Ref Range   Color, Urine YELLOW YELLOW   APPearance CLEAR CLEAR   Specific Gravity, Urine 1.018 1.005 - 1.030   pH 5.5 5.0 - 8.0   Glucose, UA NEGATIVE NEGATIVE mg/dL   Hgb urine dipstick NEGATIVE NEGATIVE   Bilirubin Urine NEGATIVE NEGATIVE   Ketones, ur 40 (A) NEGATIVE mg/dL   Protein, ur NEGATIVE NEGATIVE mg/dL   Urobilinogen, UA 1.0 0.0 - 1.0 mg/dL   Nitrite NEGATIVE NEGATIVE   Leukocytes, UA NEGATIVE NEGATIVE    Comment: MICROSCOPIC NOT DONE ON URINES WITH NEGATIVE PROTEIN, BLOOD, LEUKOCYTES, NITRITE, OR GLUCOSE <1000 mg/dL.  Glucose, capillary     Status: Abnormal   Collection Time: 02/17/15  6:27 AM  Result Value Ref Range   Glucose-Capillary 112 (H) 65 - 99 mg/dL  Comprehensive metabolic panel     Status: Abnormal   Collection Time: 02/17/15  8:16 AM  Result Value Ref Range   Sodium 139 135 - 145 mmol/L   Potassium 3.4 (L) 3.5 - 5.1 mmol/L   Chloride 99 (L) 101 - 111 mmol/L   CO2 26 22 - 32 mmol/L   Glucose, Bld 132 (H) 65 - 99 mg/dL   BUN 7 6 - 20 mg/dL   Creatinine, Ser 1.07 0.61 - 1.24 mg/dL   Calcium 8.8 (L) 8.9 - 10.3 mg/dL   Total Protein 7.0 6.5 - 8.1 g/dL    Albumin 2.7 (L) 3.5 - 5.0 g/dL   AST 22 15 - 41 U/L   ALT 18 17 - 63 U/L   Alkaline Phosphatase 48 38 - 126 U/L   Total Bilirubin 0.8 0.3 - 1.2 mg/dL   GFR calc non Af Amer >60 >60 mL/min   GFR calc Af Amer >60 >60 mL/min    Comment: (NOTE) The eGFR has been calculated using the CKD EPI equation. This calculation has not been validated in all clinical situations. eGFR's persistently <60 mL/min signify possible Chronic Kidney Disease.    Anion gap 14 5 - 15  CBC WITH DIFFERENTIAL     Status: Abnormal   Collection Time: 02/17/15  8:16 AM  Result Value Ref Range   WBC 12.9 (H) 4.0 - 10.5 K/uL   RBC 3.82 (L) 4.22 - 5.81 MIL/uL   Hemoglobin 11.4 (L) 13.0 - 17.0 g/dL   HCT 33.5 (L) 39.0 - 52.0 %   MCV 87.7 78.0 - 100.0 fL   MCH 29.8 26.0 - 34.0 pg   MCHC 34.0 30.0 - 36.0 g/dL   RDW 14.7 11.5 - 15.5 %   Platelets 248 150 - 400 K/uL   Neutrophils Relative % 74 %   Neutro Abs 9.6 (H) 1.7 - 7.7 K/uL   Lymphocytes Relative 13 %   Lymphs Abs 1.6 0.7 - 4.0 K/uL   Monocytes Relative 12 %   Monocytes Absolute 1.6 (H) 0.1 - 1.0 K/uL   Eosinophils Relative 1 %   Eosinophils Absolute 0.1 0.0 - 0.7 K/uL   Basophils Relative 0 %   Basophils Absolute 0.0 0.0 - 0.1 K/uL  Lipase, blood     Status: Abnormal   Collection Time: 02/17/15  8:16 AM  Result Value Ref Range   Lipase 53 (H) 22 - 51 U/L  Lipid panel     Status: Abnormal   Collection Time: 02/17/15  8:16 AM  Result Value Ref Range  Cholesterol 171 0 - 200 mg/dL   Triglycerides 65 <150 mg/dL   HDL 49 >40 mg/dL   Total CHOL/HDL Ratio 3.5 RATIO   VLDL 13 0 - 40 mg/dL   LDL Cholesterol 109 (H) 0 - 99 mg/dL    Comment:        Total Cholesterol/HDL:CHD Risk Coronary Heart Disease Risk Table                     Men   Women  1/2 Average Risk   3.4   3.3  Average Risk       5.0   4.4  2 X Average Risk   9.6   7.1  3 X Average Risk  23.4   11.0        Use the calculated Patient Ratio above and the CHD Risk Table to determine the  patient's CHD Risk.        ATP III CLASSIFICATION (LDL):  <100     mg/dL   Optimal  100-129  mg/dL   Near or Above                    Optimal  130-159  mg/dL   Borderline  160-189  mg/dL   High  >190     mg/dL   Very High   Glucose, capillary     Status: Abnormal   Collection Time: 02/17/15 11:40 AM  Result Value Ref Range   Glucose-Capillary 125 (H) 65 - 99 mg/dL  Glucose, capillary     Status: Abnormal   Collection Time: 02/17/15  5:21 PM  Result Value Ref Range   Glucose-Capillary 124 (H) 65 - 99 mg/dL  Glucose, capillary     Status: Abnormal   Collection Time: 02/17/15  9:56 PM  Result Value Ref Range   Glucose-Capillary 116 (H) 65 - 99 mg/dL  Comprehensive metabolic panel     Status: Abnormal   Collection Time: 02/18/15  6:45 AM  Result Value Ref Range   Sodium 140 135 - 145 mmol/L   Potassium 3.6 3.5 - 5.1 mmol/L   Chloride 104 101 - 111 mmol/L   CO2 24 22 - 32 mmol/L   Glucose, Bld 108 (H) 65 - 99 mg/dL   BUN 7 6 - 20 mg/dL   Creatinine, Ser 1.05 0.61 - 1.24 mg/dL   Calcium 8.5 (L) 8.9 - 10.3 mg/dL   Total Protein 6.8 6.5 - 8.1 g/dL   Albumin 2.5 (L) 3.5 - 5.0 g/dL   AST 21 15 - 41 U/L   ALT 15 (L) 17 - 63 U/L   Alkaline Phosphatase 49 38 - 126 U/L   Total Bilirubin 1.1 0.3 - 1.2 mg/dL   GFR calc non Af Amer >60 >60 mL/min   GFR calc Af Amer >60 >60 mL/min    Comment: (NOTE) The eGFR has been calculated using the CKD EPI equation. This calculation has not been validated in all clinical situations. eGFR's persistently <60 mL/min signify possible Chronic Kidney Disease.    Anion gap 12 5 - 15  Lipase, blood     Status: None   Collection Time: 02/18/15  6:45 AM  Result Value Ref Range   Lipase 30 22 - 51 U/L  CBC     Status: Abnormal   Collection Time: 02/18/15  6:45 AM  Result Value Ref Range   WBC 10.3 4.0 - 10.5 K/uL   RBC 3.83 (L) 4.22 - 5.81 MIL/uL   Hemoglobin  11.3 (L) 13.0 - 17.0 g/dL   HCT 33.9 (L) 39.0 - 52.0 %   MCV 88.5 78.0 - 100.0 fL   MCH  29.5 26.0 - 34.0 pg   MCHC 33.3 30.0 - 36.0 g/dL   RDW 14.8 11.5 - 15.5 %   Platelets 246 150 - 400 K/uL  Glucose, capillary     Status: Abnormal   Collection Time: 02/18/15  8:07 AM  Result Value Ref Range   Glucose-Capillary 112 (H) 65 - 99 mg/dL  Glucose, capillary     Status: Abnormal   Collection Time: 02/18/15 12:07 PM  Result Value Ref Range   Glucose-Capillary 101 (H) 65 - 99 mg/dL     Lipid Panel     Component Value Date/Time   CHOL 171 02/17/2015 0816   TRIG 65 02/17/2015 0816   HDL 49 02/17/2015 0816   CHOLHDL 3.5 02/17/2015 0816   VLDL 13 02/17/2015 0816   LDLCALC 109* 02/17/2015 0816     Lab Results  Component Value Date   HGBA1C 5.9 09/05/2006   HGBA1C 5.9 06/05/2006     Lab Results  Component Value Date   LDLCALC 109* 02/17/2015   CREATININE 1.05 02/18/2015     HPI :Scott Bass is a 64 y.o. male medical history of hypertension, diabetes mellitus, sleep apnea, hypothyroidism and legally blind presents to the ER because of abdominal pain. Patient has been experiencing abdominal pain over the last 3-4 days. Denies any associated nausea vomiting or diarrhea. Pain was initially mostly in the epigastric area but has become more diffuse. Yesterday patient did not eat anything because of the pain. Pain is more of a dull constant pain nonradiating. Labs in the ER shows mildly elevated lipase with CT abdomen and pelvis showing features concerning for pancreatitis. Patient denies any alcohol abuse and has not had any recent medication changes. She has been admitted for acute pancreatitis.    HOSPITAL COURSE:    Acute pancreatitis - cause not clear. Could be secondary to hydrochlorothiazide, triglyceride level was 65, right upper quadrant ultrasound was negative. CT scan does not show any definite gallstones. Patient does not drink alcohol. Will hold off hydrochlorothiazide for now as possible cause. Repeat lipase is normal, patient denies any significant pain,  tolerating soft diet, status post aggressive IV fluids, now would like to go home. Patient advised to stop HCTZ  Hypertension - continue ARB. Stop HCTZ due to possible cause for pancreatitis.  Diabetes mellitus type 2 -   would advise PCP to check hemoglobin A1c, CBG control during this admission  Hypothyroidism on Synthroid.  OSA - patient states he is noncompliant with his sleep apnea.  Normocytic normochromic anemia - will need further workup as outpatient.   Discharge Exam:    Blood pressure 159/89, pulse 95, temperature 98.7 F (37.1 C), temperature source Oral, resp. rate 18, height _0  (1.702 m), weight 99.9 kg (220 lb 3.8 oz), SpO2 98 %.   General: Moderately built and nourished.  Eyes: Anicteric no pallor.  ENT: No discharge from the ears eyes nose and mouth.  Neck: No mass felt.  Cardiovascular: S1 and S2 heard.  Respiratory: No rhonchi or crepitations.  Abdomen: Soft nontender bowel sounds present. No guarding or rigidity.  Skin: No rash.  Musculoskeletal: No edema.  Psychiatric: Appears normal.  Neurologic: Alert awake oriented to time place and person. Moves all extremities.       Discharge Instructions    Diet - low sodium heart healthy  Complete by:  As directed      Increase activity slowly    Complete by:  As directed              Signed: Wylie Coon 02/18/2015, 12:31 PM        Time spent >45 mins

## 2015-04-24 ENCOUNTER — Emergency Department (HOSPITAL_COMMUNITY)
Admission: EM | Admit: 2015-04-24 | Discharge: 2015-04-24 | Disposition: A | Discharge status: Home / Self Care | Payer: BLUE CROSS/BLUE SHIELD | Attending: Emergency Medicine | Admitting: Emergency Medicine

## 2015-04-24 ENCOUNTER — Encounter (HOSPITAL_COMMUNITY): Payer: Self-pay | Admitting: Emergency Medicine

## 2015-04-24 DIAGNOSIS — K122 Cellulitis and abscess of mouth: Secondary | ICD-10-CM | POA: Diagnosis not present

## 2015-04-24 DIAGNOSIS — Z8669 Personal history of other diseases of the nervous system and sense organs: Secondary | ICD-10-CM | POA: Diagnosis not present

## 2015-04-24 DIAGNOSIS — E119 Type 2 diabetes mellitus without complications: Secondary | ICD-10-CM | POA: Insufficient documentation

## 2015-04-24 DIAGNOSIS — Z79899 Other long term (current) drug therapy: Secondary | ICD-10-CM | POA: Insufficient documentation

## 2015-04-24 DIAGNOSIS — H548 Legal blindness, as defined in USA: Secondary | ICD-10-CM | POA: Diagnosis not present

## 2015-04-24 DIAGNOSIS — E079 Disorder of thyroid, unspecified: Secondary | ICD-10-CM | POA: Insufficient documentation

## 2015-04-24 DIAGNOSIS — I10 Essential (primary) hypertension: Secondary | ICD-10-CM | POA: Diagnosis not present

## 2015-04-24 DIAGNOSIS — J029 Acute pharyngitis, unspecified: Secondary | ICD-10-CM | POA: Diagnosis present

## 2015-04-24 DIAGNOSIS — Z7982 Long term (current) use of aspirin: Secondary | ICD-10-CM | POA: Insufficient documentation

## 2015-04-24 MED ORDER — DEXAMETHASONE SODIUM PHOSPHATE 10 MG/ML IJ SOLN
10.0000 mg | Freq: Once | INTRAMUSCULAR | Status: AC
Start: 2015-04-24 — End: 2015-04-24
  Administered 2015-04-24: 10 mg via INTRAMUSCULAR
  Filled 2015-04-24: qty 1

## 2015-04-24 NOTE — ED Provider Notes (Signed)
CSN: [id number]     Arrival date & time 04/24/15  0114 History   By signing my name below, I, [name], attest that this documentation has been prepared under the direction and in the presence of Tomasita Crumble, MD . Electronically Signed: [name], Scribe. 04/24/2015. 3:26 AM.    Chief Complaint  Patient presents with  . Sore Throat     The history is provided by the patient. No language interpreter was used.    HPI Comments:  Scott Bass is a 64 y.o. male who presents to the Emergency Department complaining of a right sided lump in his throat with associated moderate pain, which he first noticed ~0030 this AM. He reports a h/o similar symptom when diagnosed with sinusitis. He states he feels as though something is stuck in his throat. Pt denies rhinorrhea, fever, and recent sick contacts. He has no cough or dysphagia, he denies feeling ill recently.  No alleviating factors noted.  Past Medical History  Diagnosis Date  . Thyroid disease   . Hypertension   . Sleep apnea   . Irregular heartbeat   . Diabetes mellitus without complication (HCC)   . Legally blind    Past Surgical History  Procedure Laterality Date  . Nose surgery     Family History  Problem Relation Age of Onset  . Diabetes Mellitus II Sister    Social History  Substance Use Topics  . Smoking status: Never Smoker   . Smokeless tobacco: None  . Alcohol Use: No    Review of Systems  10 systems reviewed and all are negative for acute change except as noted in the HPI.  Allergies  Review of patient's allergies indicates no known allergies.  Home Medications   Prior to Admission medications   Medication Sig Start Date End Date Taking? Authorizing Provider  aspirin 81 MG tablet Take 81 mg by mouth daily.   Yes Historical Provider, MD  brimonidine (ALPHAGAN) 0.15 % ophthalmic solution Place 1 drop into both eyes at bedtime. 01/17/15  Yes Historical Provider, MD  levothyroxine (SYNTHROID,  LEVOTHROID) 150 MCG tablet Take 150 mcg by mouth daily. 12/10/14  Yes Historical Provider, MD  losartan (COZAAR) 100 MG tablet Take 1 tablet (100 mg total) by mouth daily. 02/18/15  Yes Richarda Overlie, MD  LUMIGAN 0.01 % SOLN Place 1 drop into both eyes at bedtime. 01/18/15  Yes Historical Provider, MD  metoprolol tartrate (LOPRESSOR) 25 MG tablet Take 25 mg by mouth daily. 01/18/15  Yes Historical Provider, MD  omeprazole (PRILOSEC) 40 MG capsule Take 40 mg by mouth daily. 02/24/14  Yes Historical Provider, MD   BP 166/101 mmHg  Pulse 108  Temp(Src) 98.4 F (36.9 C) (Oral)  Resp 18  SpO2 100% Physical Exam  Constitutional: He is oriented to person, place, and time. Vital signs are normal. He appears well-developed and well-nourished.  Non-toxic appearance. He does not appear ill. No distress.  HENT:  Head: Normocephalic and atraumatic.  Nose: Nose normal.  Mouth/Throat: Oropharynx is clear and moist. No oropharyngeal exudate.  Swollen uvula  Eyes: Conjunctivae and EOM are normal. Pupils are equal, round, and reactive to light. No scleral icterus.  Neck: Normal range of motion. Neck supple. No tracheal deviation, no edema, no erythema and normal range of motion present. No thyroid mass and no thyromegaly present.  Cardiovascular: Normal rate, regular rhythm, S1 normal, S2 normal, normal heart sounds, intact distal pulses and normal pulses.  Exam reveals no gallop and no friction  rub.   No murmur heard. Pulmonary/Chest: Effort normal and breath sounds normal. No respiratory distress. He has no wheezes. He has no rhonchi. He has no rales.  Abdominal: Soft. Normal appearance and bowel sounds are normal. He exhibits no distension, no ascites and no mass. There is no hepatosplenomegaly. There is no tenderness. There is no rebound, no guarding and no CVA tenderness.  Musculoskeletal: Normal range of motion. He exhibits no edema or tenderness.  Lymphadenopathy:    He has no cervical adenopathy.   Neurological: He is alert and oriented to person, place, and time. He has normal strength. No cranial nerve deficit or sensory deficit.  Skin: Skin is warm, dry and intact. No petechiae and no rash noted. He is not diaphoretic. No erythema. No pallor.  Psychiatric: He has a normal mood and affect. His behavior is normal. Judgment normal.  Nursing note and vitals reviewed.   ED Course  Procedures   DIAGNOSTIC STUDIES:  Oxygen Saturation is 100% on RA, normal by my interpretation.    COORDINATION OF CARE:  3:17 AM Discussed treatment plan with pt at bedside and pt agreed to plan.   MDM   Final diagnoses:  None    Patient presents to the ED for feelings of a mass in his throat. He denies dysphagia.  On exam he has a swollen uvula, consistent with uvulitis.  He was treated with IM decadron and advised to fu with PCP within 3 days.  History is not consistent with bacterial source of the patients uvulitis.  He demonstrates good understanding.  He appears well and in NAD.  VS remain within his normal limits and he is safe for DC.  I personally performed the services described in this documentation, which was scribed in my presence. The recorded information has been reviewed and is accurate.      Tomasita Crumble, MD 04/24/15 [phone]

## 2015-04-24 NOTE — ED Notes (Signed)
Per EMS, pt from home with throat discomfort. Pt describes it as, "mucuos lump, that I can't cough up". Positive for nausea, denies vomiting.

## 2015-04-24 NOTE — ED Notes (Signed)
Pt stated he is calling a cab to pick him up; will discharge to the lobby

## 2015-04-24 NOTE — Discharge Instructions (Signed)
Uvulitis Mr. Braden, take motrin as needed for your sore throat.  See your primary care doctor within 3 days for close follow up.  If symptoms worsen, come back to the ED immediately.  Thank you. Uvulitis is infection or inflammation of the uvula. The uvula is the small, finger-like piece of tissue that hangs down at the back of your throat. CAUSES This condition may be caused by:  An infection in the mouth or throat. This is the most common cause.  Trauma to the uvula. Causes of trauma include burning your mouth and heavy snoring.  Fluid build-up (edema). Edema can be triggered be an allergic reaction. Uvulitis that is caused by edema is called Quincke disease.  Inhaling irritants, such as chemical agents, smoke, or steam. SYMPTOMS Symptoms of this condition depend on the cause.  Symptoms of uvulitis that is caused by infection include:  Red, swollen uvula.  Sore throat.  Fever.  Headache.  Swollen neck glands. Symptoms of uvulitis that is caused by trauma, edema, or irritation include:  Red, swollen uvula.  Sore throat.  Trouble swallowing.  Choking or gagging.  Trouble breathing. DIAGNOSIS This condition is diagnosed with a physical exam. You also may have tests, such as a throat culture and blood tests. TREATMENT Treatment for this condition depends on the cause. Treatment may involve:  Antibiotic medicine. Antibiotics may be prescribed if a bacterial infection is the cause.  Steroid medicine. Steroids may be given if edema is the cause.  Surgery to remove part of the uvula (partial uvulectomy). HOME CARE INSTRUCTIONS  Rest as much as possible until your condition improves.  Drink enough fluid to keep your urine clear or pale yellow.  Take over-the-counter and prescription medicines only as told by your health care provider.  If you were prescribed an antibiotic medicine, take it as told by your health care provider. Do not stop taking the antibiotic even  if you start to feel better.  Use a cool-mist humidifier to ease irritation in your throat.  While your throat is sore:  Eat soft foods or drink liquids, such as soup.  Gargle with a salt-water mixture 3-4 times per day or as needed. To make a salt-water mixture, completely dissolve -1 tsp of salt in 1 cup of warm water.  Keep all follow-up visits as told by your health care provider. This is important. SEEK MEDICAL CARE IF:  You have a fever.  You have trouble eating.  Your symptoms do not get better.  Your symptoms come back after treatment. SEEK IMMEDIATE MEDICAL CARE IF:  You have trouble breathing.  You have trouble swallowing.   This information is not intended to replace advice given to you by your health care provider. Make sure you discuss any questions you have with your health care provider.   Document Released: 12/03/2003 Document Revised: 01/13/2015 Document Reviewed: 07/15/2014 Elsevier Interactive Patient Education [employer]

## 2015-05-19 ENCOUNTER — Encounter (HOSPITAL_COMMUNITY): Payer: Self-pay | Admitting: *Deleted

## 2015-05-19 ENCOUNTER — Emergency Department (HOSPITAL_COMMUNITY)
Admission: EM | Admit: 2015-05-19 | Discharge: 2015-05-19 | Disposition: A | Discharge status: Home / Self Care | Payer: BLUE CROSS/BLUE SHIELD | Attending: Emergency Medicine | Admitting: Emergency Medicine

## 2015-05-19 DIAGNOSIS — I10 Essential (primary) hypertension: Secondary | ICD-10-CM | POA: Diagnosis not present

## 2015-05-19 DIAGNOSIS — E079 Disorder of thyroid, unspecified: Secondary | ICD-10-CM | POA: Insufficient documentation

## 2015-05-19 DIAGNOSIS — K625 Hemorrhage of anus and rectum: Secondary | ICD-10-CM | POA: Diagnosis present

## 2015-05-19 DIAGNOSIS — Z79899 Other long term (current) drug therapy: Secondary | ICD-10-CM | POA: Insufficient documentation

## 2015-05-19 DIAGNOSIS — Z8669 Personal history of other diseases of the nervous system and sense organs: Secondary | ICD-10-CM | POA: Insufficient documentation

## 2015-05-19 DIAGNOSIS — E119 Type 2 diabetes mellitus without complications: Secondary | ICD-10-CM | POA: Insufficient documentation

## 2015-05-19 DIAGNOSIS — Z7982 Long term (current) use of aspirin: Secondary | ICD-10-CM | POA: Diagnosis not present

## 2015-05-19 DIAGNOSIS — E669 Obesity, unspecified: Secondary | ICD-10-CM | POA: Insufficient documentation

## 2015-05-19 LAB — COMPREHENSIVE METABOLIC PANEL
ALBUMIN: 3 g/dL — AB (ref 3.5–5.0)
ALT: 19 U/L (ref 17–63)
ANION GAP: 8 (ref 5–15)
AST: 26 U/L (ref 15–41)
Alkaline Phosphatase: 54 U/L (ref 38–126)
BUN: 13 mg/dL (ref 6–20)
CHLORIDE: 108 mmol/L (ref 101–111)
CO2: 25 mmol/L (ref 22–32)
Calcium: 9 mg/dL (ref 8.9–10.3)
Creatinine, Ser: 1.13 mg/dL (ref 0.61–1.24)
GFR calc Af Amer: >60 mL/min (ref >60)
Glucose, Bld: 144 mg/dL — ABNORMAL HIGH (ref 65–99)
POTASSIUM: 4.1 mmol/L (ref 3.5–5.1)
Sodium: 141 mmol/L (ref 135–145)
TOTAL PROTEIN: 6.5 g/dL (ref 6.5–8.1)
Total Bilirubin: 0.3 mg/dL (ref 0.3–1.2)

## 2015-05-19 LAB — CBC WITH DIFFERENTIAL/PLATELET
BASOS ABS: 0 10*3/uL (ref 0.0–0.1)
Basophils Relative: 1 %
Eosinophils Absolute: 0.2 10*3/uL (ref 0.0–0.7)
Eosinophils Relative: 2 %
HEMATOCRIT: 34.6 % — AB (ref 39.0–52.0)
Hemoglobin: 11.2 g/dL — ABNORMAL LOW (ref 13.0–17.0)
LYMPHS PCT: 34 %
Lymphs Abs: 2.7 10*3/uL (ref 0.7–4.0)
MCH: 29.2 pg (ref 26.0–34.0)
MCHC: 32.4 g/dL (ref 30.0–36.0)
MCV: 90.3 fL (ref 78.0–100.0)
Monocytes Absolute: 0.6 10*3/uL (ref 0.1–1.0)
Monocytes Relative: 7 %
NEUTROS ABS: 4.6 10*3/uL (ref 1.7–7.7)
Neutrophils Relative %: 56 %
Platelets: 253 10*3/uL (ref 150–400)
RBC: 3.83 MIL/uL — AB (ref 4.22–5.81)
RDW: 15.8 % — ABNORMAL HIGH (ref 11.5–15.5)
WBC: 8.1 10*3/uL (ref 4.0–10.5)

## 2015-05-19 LAB — POC OCCULT BLOOD, ED: FECAL OCCULT BLD: POSITIVE — AB

## 2015-05-19 NOTE — ED Notes (Signed)
Pt arrives with c/o rectal bleeding and lower abdominal pain. Pt states the blood is dark red in nature and denies constipation.

## 2015-05-19 NOTE — ED Notes (Signed)
Pt is in stable condition upon d/c and ambulates from ED. 

## 2015-05-19 NOTE — ED Provider Notes (Addendum)
CSN: [id number]     Arrival date & time 05/19/15  0859 History   First MD Initiated Contact with Patient 05/19/15 [phone]     Chief Complaint  Patient presents with  . Rectal Bleeding     (Consider location/radiation/quality/duration/timing/severity/associated sxs/prior Treatment) HPI Patient reports 2 episodes of rectal bleeding within the past 3 days. He reports that with a bowel movement he saw dark red blood. This occurred again today. He reports some mild rectal discomfort but does not endorse significant pain. Eyes she's had constipation or straining. Patient reports he has had rectal bleeding in the past and was told it was hemorrhoids. He does report having had colonoscopy in the past although he does not think so within the past couple of years. He has not had colon cancer or required any interventional procedures. No lightheadedness dizziness or chest pain. Patient takes a daily aspirin but not other anticoagulants. Past Medical History  Diagnosis Date  . Thyroid disease   . Hypertension   . Sleep apnea   . Irregular heartbeat   . Diabetes mellitus without complication (HCC)   . Legally blind    Past Surgical History  Procedure Laterality Date  . Nose surgery     Family History  Problem Relation Age of Onset  . Diabetes Mellitus II Sister    Social History  Substance Use Topics  . Smoking status: Never Smoker   . Smokeless tobacco: None  . Alcohol Use: No    Review of Systems  10 Systems reviewed and are negative for acute change except as noted in the HPI.   Allergies  Review of patient's allergies indicates no known allergies.  Home Medications   Prior to Admission medications   Medication Sig Start Date End Date Taking? Authorizing Provider  aspirin 81 MG tablet Take 81 mg by mouth daily.   Yes Historical Provider, MD  brimonidine (ALPHAGAN) 0.15 % ophthalmic solution Place 1 drop into both eyes at bedtime. 01/17/15  Yes Historical Provider, MD   levothyroxine (SYNTHROID, LEVOTHROID) 150 MCG tablet Take 150 mcg by mouth daily. 12/10/14  Yes Historical Provider, MD  losartan (COZAAR) 100 MG tablet Take 1 tablet (100 mg total) by mouth daily. 02/18/15  Yes Richarda Overlie, MD  LUMIGAN 0.01 % SOLN Place 1 drop into both eyes at bedtime. 01/18/15  Yes Historical Provider, MD  metFORMIN (GLUCOPHAGE) 1000 MG tablet Take 1,000 mg by mouth 2 (two) times daily with a meal.   Yes Historical Provider, MD  metoprolol tartrate (LOPRESSOR) 25 MG tablet Take 25 mg by mouth daily. 01/18/15  Yes Historical Provider, MD  omeprazole (PRILOSEC) 40 MG capsule Take 40 mg by mouth daily. 02/24/14  Yes Historical Provider, MD  rosuvastatin (CRESTOR) 20 MG tablet Take 20 mg by mouth daily.   Yes Historical Provider, MD   BP 149/82 mmHg  Pulse 79  Temp(Src) 99.2 F (37.3 C) (Oral)  Resp 17  Ht 5\' 7"  (1.702 m)  Wt 227 lb (102.967 kg)  BMI 35.55 kg/m2  SpO2 100% Physical Exam  Constitutional: He is oriented to person, place, and time.  Patient is moderately obese and somewhat deconditioned. He is alert and nontoxic. No respiratory distress. He is otherwise well appearance.  HENT:  Head: Normocephalic and atraumatic.  Mouth/Throat: Oropharynx is clear and moist.  Eyes:  Patient appears to have some baseline disconjugate gaze and chronic nystagmus  Neck: Neck supple.  Cardiovascular: Normal rate, regular rhythm, normal heart sounds and intact distal pulses.   Pulmonary/Chest:  Effort normal and breath sounds normal.  Abdominal: Soft. Bowel sounds are normal. He exhibits no distension. There is no tenderness.  Genitourinary:  Perianal examination is normal. No large hemorrhoids or fissures present. No blood at the anal opening. Digital examination the vault is nearly clear with just trace brownish stool present. Prostate palpation is nontender but appears somewhat enlarged.  Musculoskeletal: Normal range of motion. He exhibits no edema.  Neurological: He is alert  and oriented to person, place, and time. He has normal strength. Coordination normal. GCS eye subscore is 4. GCS verbal subscore is 5. GCS motor subscore is 6.  Skin: Skin is warm, dry and intact.  Psychiatric: He has a normal mood and affect.    ED Course  Procedures (including critical care time) Labs Review Labs Reviewed  CBC WITH DIFFERENTIAL/PLATELET - Abnormal; Notable for the following:    RBC 3.83 (*)    Hemoglobin 11.2 (*)    HCT 34.6 (*)    RDW 15.8 (*)    All other components within normal limits  COMPREHENSIVE METABOLIC PANEL - Abnormal; Notable for the following:    Glucose, Bld 144 (*)    Albumin 3.0 (*)    All other components within normal limits  POC OCCULT BLOOD, ED - Abnormal; Notable for the following:    Fecal Occult Bld POSITIVE (*)    All other components within normal limits  POC OCCULT BLOOD, ED    Imaging Review No results found. I have personally reviewed and evaluated these images and lab results as part of my medical decision-making.   EKG Interpretation None      MDM   Final diagnoses:  Rectal bleeding   Patient presents with report of rectal bleeding with bowel movement. Did examination there is no melena. The stool is normal in appearance. It does test occult positive. Patient however has stable H&H. He also does not have elevated BUN to suggest chronic reabsorption. Patient is well in appearance with nonsurgical abdomen. At this time I feel he is safe to follow-up with his family physician for ongoing observation of periodic rectal bleeding. He is counseled on the necessity to schedule an appointment for reevaluation to determine if repeat colonoscopy is indicated at this time.    Arby Barrette, MD 05/19/15 1222  Arby Barrette, MD 05/19/15 1229

## 2015-05-19 NOTE — Discharge Instructions (Signed)
Precautionary instructions for Gastrointestinal Bleeding Gastrointestinal (GI) bleeding means there is bleeding somewhere along the digestive tract, between the mouth and anus. CAUSES  There are many different problems that can cause GI bleeding. Possible causes include:  Esophagitis. This is inflammation, irritation, or swelling of the esophagus.  Hemorrhoids.These are veins that are full of blood (engorged) in the rectum. They cause pain, inflammation, and may bleed.  Anal fissures.These are areas of painful tearing which may bleed. They are often caused by passing hard stool.  Diverticulosis.These are pouches that form on the colon over time, with age, and may bleed significantly.  Diverticulitis.This is inflammation in areas with diverticulosis. It can cause pain, fever, and bloody stools, although bleeding is rare.  Polyps and cancer. Colon cancer often starts out as precancerous polyps.  Gastritis and ulcers.Bleeding from the upper gastrointestinal tract (near the stomach) may travel through the intestines and produce black, sometimes tarry, often bad smelling stools. In certain cases, if the bleeding is fast enough, the stools may not be black, but red. This condition may be life-threatening. SYMPTOMS   Vomiting bright red blood or material that looks like coffee grounds.  Bloody, black, or tarry stools. DIAGNOSIS  Your caregiver may diagnose your condition by taking your history and performing a physical exam. More tests may be needed, including:  X-rays and other imaging tests.  Esophagogastroduodenoscopy (EGD). This test uses a flexible, lighted tube to look at your esophagus, stomach, and small intestine.  Colonoscopy. This test uses a flexible, lighted tube to look at your colon. TREATMENT  Treatment depends on the cause of your bleeding.   For bleeding from the esophagus, stomach, small intestine, or colon, the caregiver doing your EGD or colonoscopy may be able to  stop the bleeding as part of the procedure.  Inflammation or infection of the colon can be treated with medicines.  Many rectal problems can be treated with creams, suppositories, or warm baths.  Surgery is sometimes needed.  Blood transfusions are sometimes needed if you have lost a lot of blood. If bleeding is slow, you may be allowed to go home. If there is a lot of bleeding, you will need to stay in the hospital for observation. HOME CARE INSTRUCTIONS   Take any medicines exactly as prescribed.  Keep your stools soft by eating foods that are high in fiber. These foods include whole grains, legumes, fruits, and vegetables. Prunes (1 to 3 a day) work well for many people.  Drink enough fluids to keep your urine clear or pale yellow. SEEK IMMEDIATE MEDICAL CARE IF:   Your bleeding increases.  You feel lightheaded, weak, or you faint.  You have severe cramps in your back or abdomen.  You pass large blood clots in your stool.  Your problems are getting worse. MAKE SURE YOU:   Understand these instructions.  Will watch your condition.  Will get help right away if you are not doing well or get worse.   This information is not intended to replace advice given to you by your health care provider. Make sure you discuss any questions you have with your health care provider.   Document Released: 04/21/2000 Document Revised: 04/10/2012 Document Reviewed: 10/12/2014 Elsevier Interactive Patient Education [employer]

## 2015-05-19 NOTE — ED Notes (Signed)
Lab states cmp is hemolyzed, need redraw. CMP reordered.

## 2015-11-22 ENCOUNTER — Ambulatory Visit (INDEPENDENT_AMBULATORY_CARE_PROVIDER_SITE_OTHER): Payer: BLUE CROSS/BLUE SHIELD | Admitting: Physician Assistant

## 2015-11-22 ENCOUNTER — Ambulatory Visit (INDEPENDENT_AMBULATORY_CARE_PROVIDER_SITE_OTHER): Payer: BLUE CROSS/BLUE SHIELD

## 2015-11-22 VITALS — BP 132/80 | HR 88 | Temp 98.1°F | Resp 17 | Ht 67.5 in | Wt 226.0 lb

## 2015-11-22 DIAGNOSIS — R109 Unspecified abdominal pain: Secondary | ICD-10-CM | POA: Diagnosis not present

## 2015-11-22 DIAGNOSIS — M5136 Other intervertebral disc degeneration, lumbar region: Secondary | ICD-10-CM | POA: Diagnosis not present

## 2015-11-22 LAB — COMPREHENSIVE METABOLIC PANEL
ALBUMIN: 3.9 g/dL (ref 3.6–5.1)
ALT: 13 U/L (ref 9–46)
AST: 19 U/L (ref 10–35)
Alkaline Phosphatase: 70 U/L (ref 40–115)
BUN: 16 mg/dL (ref 7–25)
CALCIUM: 9 mg/dL (ref 8.6–10.3)
CHLORIDE: 106 mmol/L (ref 98–110)
CO2: 25 mmol/L (ref 20–31)
Creat: 1.21 mg/dL (ref 0.70–1.25)
Glucose, Bld: 172 mg/dL — ABNORMAL HIGH (ref 65–99)
Potassium: 4.2 mmol/L (ref 3.5–5.3)
Sodium: 140 mmol/L (ref 135–146)
Total Bilirubin: 0.4 mg/dL (ref 0.2–1.2)
Total Protein: 7.6 g/dL (ref 6.1–8.1)

## 2015-11-22 LAB — POC MICROSCOPIC URINALYSIS (UMFC): Mucus: ABSENT

## 2015-11-22 LAB — POCT CBC
GRANULOCYTE PERCENT: 63.2 % (ref 37–80)
HEMATOCRIT: 32.3 % — AB (ref 43.5–53.7)
Hemoglobin: 10.9 g/dL — AB (ref 14.1–18.1)
Lymph, poc: 2.5 (ref 0.6–3.4)
MCH, POC: 28.2 pg (ref 27–31.2)
MCHC: 33.7 g/dL (ref 31.8–35.4)
MCV: 83.7 fL (ref 80–97)
MID (CBC): 0.6 (ref 0–0.9)
MPV: 7.8 fL (ref 0–99.8)
POC GRANULOCYTE: 5.3 (ref 2–6.9)
POC LYMPH %: 29.7 % (ref 10–50)
POC MID %: 7.1 % (ref 0–12)
Platelet Count, POC: 271 10*3/uL (ref 142–424)
RBC: 3.86 M/uL — AB (ref 4.69–6.13)
RDW, POC: 16.7 %
WBC: 8.4 10*3/uL (ref 4.6–10.2)

## 2015-11-22 LAB — POCT URINALYSIS DIP (MANUAL ENTRY)
Blood, UA: NEGATIVE
Glucose, UA: NEGATIVE
LEUKOCYTES UA: NEGATIVE
NITRITE UA: NEGATIVE
PH UA: 5.5
Spec Grav, UA: >=1.03
Urobilinogen, UA: 0.2

## 2015-11-22 MED ORDER — PREDNISONE 10 MG (21) PO TBPK: ORAL_TABLET | ORAL | Status: DC

## 2015-11-22 MED ORDER — MELOXICAM 7.5 MG PO TABS: 7.5000 mg | ORAL_TABLET | Freq: Every day | ORAL | Status: DC

## 2015-11-22 NOTE — Progress Notes (Signed)
[facility] [address], [place] [address] [id number]  Date:  11/22/2015   Name:  Scott Bass   DOB:  20-Apr-1951   MRN:  [id number]  PCP:  Willow Ora, MD    Chief Complaint: Back Pain   History of Present Illness:  This is a 65 y.o. male with PMH HLD, HTN, DM2, OSA, hx pancreatitis, uveitis who is presenting with low back pain x 1 month. Pain is mostly located to right low back but radiates across back and bilaterally into groin. At one point he was having pain into left testicle but not having any longer. Pain is gradually getting worse. No radiation into legs but he does state that his left leg feels weak. Getting some leg cramping. No paresthesias. Denies dysuria, incontinence, hematuria. Pain is worse when he goes from sitting to standing position. Pt has chronic right sided lower back pain. 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 Never had an injury to his back. He does not usually have radiation of the pain. Has been occ taking tylenol extra strength for arthritis. Does not help so he has stopped taking. He has been using a cane occ for ambulation when the pain is bad. He has seen a spine specialist before, Dr. Newell Coral. He only saw once. He was told he was not a surgical candidate at that time and was given some prednisone. He has never done PT. Had MRI in 2009 -- showed multilevel degenerative disease and spinal stenosis at L3-L4 and L5-L6.    3 months ago A1C 6.7. PCP Dr. Mardelle Matte. No problems with kidneys. Normal CMP 6 months ago. Review of Systems:  Review of Systems See HPI  Patient Active Problem List   Diagnosis Date Noted  . Pancreatitis 02/17/2015  . Diabetes mellitus type 2, controlled (HCC) 02/17/2015  . Essential hypertension 02/17/2015  . Hypothyroidism 03/28/2007  . HYPERLIPIDEMIA 03/28/2007  . OBSTRUCTIVE SLEEP APNEA 03/28/2007    Prior to Admission medications   Medication Sig  Start Date End Date Taking? Authorizing Provider  aspirin 81 MG tablet Take 81 mg by mouth daily.   Yes Historical Provider, MD  brimonidine (ALPHAGAN) 0.15 % ophthalmic solution Place 1 drop into both eyes at bedtime. 01/17/15  Yes Historical Provider, MD  levothyroxine (SYNTHROID, LEVOTHROID) 150 MCG tablet Take 150 mcg by mouth daily. 12/10/14  Yes Historical Provider, MD  losartan (COZAAR) 100 MG tablet Take 1 tablet (100 mg total) by mouth daily. 02/18/15  Yes Richarda Overlie, MD  LUMIGAN 0.01 % SOLN Place 1 drop into both eyes at bedtime. 01/18/15  Yes Historical Provider, MD  metFORMIN (GLUCOPHAGE) 1000 MG tablet Take 1,000 mg by mouth 2 (two) times daily with a meal.   Yes Historical Provider, MD  metoprolol tartrate (LOPRESSOR) 25 MG tablet Take 25 mg by mouth daily. 01/18/15  Yes Historical Provider, MD  omeprazole (PRILOSEC) 40 MG capsule Take 40 mg by mouth daily. 02/24/14  Yes Historical Provider, MD  rosuvastatin (CRESTOR) 20 MG tablet Take 20 mg by mouth daily.   Yes Historical Provider, MD    No Known Allergies  Past Surgical History  Procedure Laterality Date  . Nose surgery      Social History  Substance Use Topics  . Smoking status: Never Smoker   . Smokeless tobacco: None  . Alcohol Use: No    Family History  Problem Relation Age of Onset  .  Diabetes Mellitus II Sister     Medication list has been reviewed and updated.  Physical Examination:  Physical Exam  Constitutional: He is oriented to person, place, and time. He appears well-developed and well-nourished. No distress.  HENT:  Head: Normocephalic and atraumatic.  Right Ear: Hearing normal.  Left Ear: Hearing normal.  Nose: Nose normal.  Eyes: Conjunctivae and lids are normal. Right eye exhibits no discharge. Left eye exhibits no discharge. No scleral icterus.  Cardiovascular: Normal rate, regular rhythm, normal heart sounds and normal pulses.   No murmur heard. Pulmonary/Chest: Effort normal and breath  sounds normal. No respiratory distress. He has no wheezes. He has no rhonchi. He has no rales.  Abdominal: Soft. Normal appearance. There is no tenderness. There is no CVA tenderness.  Musculoskeletal:       Lumbar back: He exhibits decreased range of motion (decreased in all planes) and tenderness (only tenderness mild, near right flank). He exhibits no bony tenderness and no swelling.  Straight leg raise negative bilaterally  Neurological: He is alert and oriented to person, place, and time. He has normal strength. No sensory deficit. Gait (antalgic, using cane) abnormal.  Reflex Scores:      Patellar reflexes are 1+ on the right side and 1+ on the left side. Skin: Skin is warm, dry and intact. No lesion and no rash noted.  Psychiatric: He has a normal mood and affect. His speech is normal and behavior is normal. Thought content normal.   BP 132/80 mmHg  Pulse 88  Temp(Src) 98.1 F (36.7 C) (Oral)  Resp 17  Ht 5' 7.5" (1.715 m)  Wt 226 lb (102.513 kg)  BMI 34.85 kg/m2  SpO2 92%  Results for orders placed or performed in visit on 11/22/15  POCT Microscopic Urinalysis ([facility])  Result Value Ref Range   WBC,UR,HPF,POC None None WBC/hpf   RBC,UR,HPF,POC None None RBC/hpf   Bacteria None None, Too numerous to count   Mucus Absent Absent   Epithelial Cells, UR Per Microscopy None None, Too numerous to count cells/hpf  POCT urinalysis dipstick  Result Value Ref Range   Color, UA yellow yellow   Clarity, UA clear clear   Glucose, UA negative negative   Bilirubin, UA small (A) negative   Ketones, POC UA trace (5) (A) negative   Spec Grav, UA >=1.030    Blood, UA negative negative   pH, UA 5.5    Protein Ur, POC =30 (A) negative   Urobilinogen, UA 0.2    Nitrite, UA Negative Negative   Leukocytes, UA Negative Negative  POCT CBC  Result Value Ref Range   WBC 8.4 4.6 - 10.2 K/uL   Lymph, poc 2.5 0.6 - 3.4   POC LYMPH PERCENT 29.7 10 - 50 %L   MID (cbc) 0.6 0 - 0.9   POC MID % 7.1  0 - 12 %M   POC Granulocyte 5.3 2 - 6.9   Granulocyte percent 63.2 37 - 80 %G   RBC 3.86 (A) 4.69 - 6.13 M/uL   Hemoglobin 10.9 (A) 14.1 - 18.1 g/dL   HCT, POC 16.1 (A) 09.6 - 53.7 %   MCV 83.7 80 - 97 fL   MCH, POC 28.2 27 - 31.2 pg   MCHC 33.7 31.8 - 35.4 g/dL   RDW, POC 04.5 %   Platelet Count, POC 271 142 - 424 K/uL   MPV 7.8 0 - 99.8 fL   Dg Lumbar Spine Complete  11/22/2015  CLINICAL DATA:  Back  pain.  Initial evaluation. EXAM: LUMBAR SPINE - COMPLETE 4+ VIEW COMPARISON:  CT 10/12/ 2016. FINDINGS: Degenerative changes lumbar spine. No acute bony abnormality identified. Normal alignment mineralization. IMPRESSION: Diffuse multilevel degenerative change. No acute abnormality identified. Electronically Signed   By: Maisie Fus  Register   On: 11/22/2015 09:56   Assessment and Plan:  1. Lumbar degenerative disc disease 2. Right flank pain Radiograph with multilevel degenerative changes. Spinal stenosis on MRI in 2009. UA negative, CBC wnl, CMP pending. Neuro exam normal. SLR negative. Treat with prednisone taper. Once taper complete, may take mobic daily for pain, tylenol prn. Referred to ortho -- possibly need another MRI or could consider PT if not a surgical candidate. Return precautions discussed. - predniSONE (STERAPRED UNI-PAK 21 TAB) 10 MG (21) TBPK tablet; Take prednisone taper by mouth as directed: 6 tabs x5x4x3x2x1  Dispense: 21 tablet; Refill: 0 - meloxicam (MOBIC) 7.5 MG tablet; Take 1 tablet (7.5 mg total) by mouth daily.  Dispense: 30 tablet; Refill: 0 - Ambulatory referral to Orthopedic Surgery - POCT Microscopic Urinalysis ([facility]) - POCT urinalysis dipstick - POCT CBC - Comprehensive metabolic panel - DG Lumbar Spine Complete; Future   [place] Miners. Dyke Brackett, MHS [facility]  11/22/2015

## 2015-11-22 NOTE — Patient Instructions (Signed)
Take prednisone as directed - 6 tabs, then 5, 4, 3, 2, 1 After finishing prednisone, can take mobic once a day in the morning. Do not use with other products containing ibuprofen, naprosyn or aspirin. You may use tylenol with this medication. You will get a phone call to make appt with ortho. They may order another MRI or get you set up with PT. Heat, gentle massage and gentle stretching can help. Remain active, as inactivity can cause more pain. Don't do anything too strenuous though. If you develop new numbness, weakness or incontinence of urine or bowel, return to clinic or go to the emergency room ASAP.

## 2016-02-11 DIAGNOSIS — Z0271 Encounter for disability determination: Secondary | ICD-10-CM

## 2016-06-30 ENCOUNTER — Ambulatory Visit (INDEPENDENT_AMBULATORY_CARE_PROVIDER_SITE_OTHER): Payer: Medicare HMO | Admitting: Pulmonary Disease

## 2016-06-30 ENCOUNTER — Encounter: Payer: Self-pay | Admitting: Pulmonary Disease

## 2016-06-30 ENCOUNTER — Institutional Professional Consult (permissible substitution): Payer: BC Managed Care – PPO | Admitting: Pulmonary Disease

## 2016-06-30 VITALS — BP 142/96 | HR 75 | Ht 67.5 in | Wt 224.4 lb

## 2016-06-30 DIAGNOSIS — G4733 Obstructive sleep apnea (adult) (pediatric): Secondary | ICD-10-CM | POA: Diagnosis not present

## 2016-06-30 NOTE — Patient Instructions (Signed)
Will arrange for home sleep study Will call to arrange for follow up after sleep study reviewed  

## 2016-06-30 NOTE — Progress Notes (Signed)
   Subjective:    Patient ID: Scott Bass, male    DOB: Aug 15, 1950, 66 y.o.   MRN: [id number]  HPI    Review of Systems  Constitutional: Negative for fever and unexpected weight change.  HENT: Positive for congestion, sore throat and trouble swallowing. Negative for dental problem, ear pain, nosebleeds, postnasal drip, rhinorrhea, sinus pressure and sneezing.   Eyes: Negative for redness and itching.  Respiratory: Negative for cough, chest tightness, shortness of breath and wheezing.   Cardiovascular: Positive for palpitations. Negative for leg swelling.  Gastrointestinal: Negative for nausea and vomiting.       Acid heartburn / Indigestion  Genitourinary: Negative for dysuria.  Musculoskeletal: Negative for joint swelling.  Skin: Positive for rash.  Neurological: Positive for headaches.  Hematological: Does not bruise/bleed easily.  Psychiatric/Behavioral: Negative for dysphoric mood. The patient is not nervous/anxious.        Objective:   Physical Exam        Assessment & Plan:

## 2016-06-30 NOTE — Progress Notes (Signed)
Past Surgical History He  has a past surgical history that includes Nose surgery.  No Known Allergies  Family History His family history includes Diabetes Mellitus II in his sister.  Social History He  reports that he has never smoked. He has never used smokeless tobacco. He reports that he does not drink alcohol or use drugs.  Review of systems Constitutional: Negative for fever and unexpected weight change.  HENT: Positive for congestion, sore throat and trouble swallowing. Negative for dental problem, ear pain, nosebleeds, postnasal drip, rhinorrhea, sinus pressure and sneezing.   Eyes: Negative for redness and itching.  Respiratory: Negative for cough, chest tightness, shortness of breath and wheezing.   Cardiovascular: Positive for palpitations. Negative for leg swelling.  Gastrointestinal: Negative for nausea and vomiting.       Acid heartburn / Indigestion  Genitourinary: Negative for dysuria.  Musculoskeletal: Negative for joint swelling.  Skin: Positive for rash.  Neurological: Positive for headaches.  Hematological: Does not bruise/bleed easily.  Psychiatric/Behavioral: Negative for dysphoric mood. The patient is not nervous/anxious.     Current Outpatient Prescriptions on File Prior to Visit  Medication Sig  . aspirin 81 MG tablet Take 81 mg by mouth daily.  . brimonidine (ALPHAGAN) 0.15 % ophthalmic solution Place 1 drop into both eyes at bedtime.  Marland Kitchen levothyroxine (SYNTHROID, LEVOTHROID) 150 MCG tablet Take 150 mcg by mouth daily.  Marland Kitchen losartan (COZAAR) 100 MG tablet Take 1 tablet (100 mg total) by mouth daily.  Marland Kitchen LUMIGAN 0.01 % SOLN Place 1 drop into both eyes at bedtime.  . metFORMIN (GLUCOPHAGE) 1000 MG tablet Take 1,000 mg by mouth 2 (two) times daily with a meal.  . rosuvastatin (CRESTOR) 20 MG tablet Take 20 mg by mouth daily.   No current facility-administered medications on file prior to visit.     Chief Complaint  Patient presents with  . SLEEP CONSULT   Referred by Arvilla Meres. Sleep study about 10+ years ago with [facility]. Previous CPAP , pressure 9. Will need new maching. Epworth Score: 11    Past medical history He  has a past medical history of Diabetes mellitus without complication (HCC); Hypertension; Irregular heartbeat; Legally blind; Sleep apnea; and Thyroid disease.  Vital signs BP (!) 142/96 (BP Location: Left Arm, Cuff Size: Normal)   Pulse 75   Ht 5' 7.5" (1.715 m)   Wt 224 lb 6.4 oz (101.8 kg)   SpO2 99%   BMI 34.63 kg/m   History of Present Illness Scott Bass is a 66 y.o. male for evaluation of sleep problems.  He had sleep study about 10 yrs ago and was found to have sleep apnea.  He was started on CPAP.  This helped initially, but then he didn't feel like the pressure setting was correct.  He stopped using CPAP several years ago.  His sleep has been getting progressively worse.  He is snoring, and wakes up hearing himself snore.  His mouth gets dry.  He is a restless sleeper.  He has trouble staying awake during the day when he is sitting quiet.  He gets leg cramps occasionally at night.  He goes to sleep at 10 pm.  He falls asleep after 25 minutes.  He wakes up some times to use the bathroom.  He gets out of bed at 7 am.  He feels tired in the morning.  He denies morning headache.  He does not use anything to help him fall sleep.  He drinks coffee in the morning.  He denies sleep walking, sleep talking, bruxism, or nightmares.  There is no history of restless legs.  He denies sleep hallucinations, sleep paralysis, or cataplexy.  The Epworth score is 11 out of 24.   Physical Exam:  General - No distress Eyes - nystagmus ENT - No sinus tenderness, no oral exudate, no LAN, no thyromegaly, TM clear, pupils equal/reactive, MP 3, enlarged tongue Cardiac - s1s2 regular, no murmur, pulses symmetric Chest - No wheeze/rales/dullness, good air entry, normal respiratory excursion Back - No focal tenderness Abd - Soft,  non-tender, no organomegaly, + bowel sounds Ext - No edema Neuro - Normal strength, cranial nerves intact Skin - No rashes Psych - Normal mood, and behavior  Discussion: He has snoring, sleep disruption, apnea, and daytime sleepiness.  He has hx of DM and HTN.  He has prior hx of OSA.  His BMI is > 35.  I am concerned he still has sleep apnea.  We discussed how sleep apnea can affect various health problems, including risks for hypertension, cardiovascular disease, and diabetes.  We also discussed how sleep disruption can increase risks for accidents, such as while driving.  Weight loss as a means of improving sleep apnea was also reviewed.  Additional treatment options discussed were CPAP therapy, oral appliance, and surgical intervention.  Assessment/plan:  Obstructive sleep apnea. - will arrange for home sleep study - advised him to keep open mind about different tx options, and that there have been significant improvements with CPAP machines and masks since his previous set up   Patient Instructions  Will arrange for home sleep study Will call to arrange for follow up after sleep study reviewed    Coralyn Helling, M.D. Pager [phone] 06/30/2016, 2:14 PM

## 2016-07-24 DIAGNOSIS — G4733 Obstructive sleep apnea (adult) (pediatric): Secondary | ICD-10-CM | POA: Diagnosis not present

## 2016-07-28 ENCOUNTER — Telehealth: Payer: Self-pay | Admitting: Pulmonary Disease

## 2016-07-28 DIAGNOSIS — G4733 Obstructive sleep apnea (adult) (pediatric): Secondary | ICD-10-CM

## 2016-07-28 NOTE — Telephone Encounter (Signed)
HST 07/24/16 >> AHI 32.2, SaO2 low 75%   Will have my nurse inform pt that sleep study shows severe sleep apnea.  Options are 1) CPAP now, 2) ROV first.  If He is agreeable to CPAP, then please send order for auto CPAP range 5 to 15 cm H2O with heated humidity and mask of choice.  Have download sent 1 month after starting CPAP and set up ROV 2 months after starting CPAP.  ROV can be with me or NP.

## 2016-07-31 DIAGNOSIS — G4733 Obstructive sleep apnea (adult) (pediatric): Secondary | ICD-10-CM | POA: Diagnosis not present

## 2016-08-02 ENCOUNTER — Other Ambulatory Visit: Payer: Self-pay | Admitting: *Deleted

## 2016-08-02 DIAGNOSIS — G4733 Obstructive sleep apnea (adult) (pediatric): Secondary | ICD-10-CM

## 2016-08-08 NOTE — Telephone Encounter (Signed)
Left message for pt to call back  °

## 2016-08-08 NOTE — Telephone Encounter (Signed)
Patient returning call - he can be reached at [phone] -pr

## 2016-08-08 NOTE — Telephone Encounter (Signed)
Spoke with pt and informed him of his sleep study results per VS.  Pt agreed to the order being placed for the cpap machine. The order was placed as well as a follow up  Appointment. Nothing further is needed at this time.

## 2016-10-25 ENCOUNTER — Ambulatory Visit: Payer: Medicare HMO | Admitting: Pulmonary Disease

## 2016-12-13 IMAGING — CT CT ABD-PELV W/ CM
2 of 5 series · 16 of 46 positions shown, 18 images · IV contrast (omnipaque)
Comparison: Barium enema 02/12/2013.

CLINICAL DATA: 63-year-old male with left lower quadrant and
epigastric pain. Initial encounter.

EXAM:
CT ABDOMEN AND PELVIS WITH CONTRAST
TECHNIQUE: Multidetector CT imaging of the abdomen and pelvis was performed
using the standard protocol following bolus administration of
intravenous contrast.
CONTRAST:  100 mL Omnipaque 300

[Series 3: a/p w/ 5mm · axial · 0.88mm/px · z∈[-549,-124]mm · 13 of 97 slices shown, 15 images]
[im 6/97  soft-tissue]
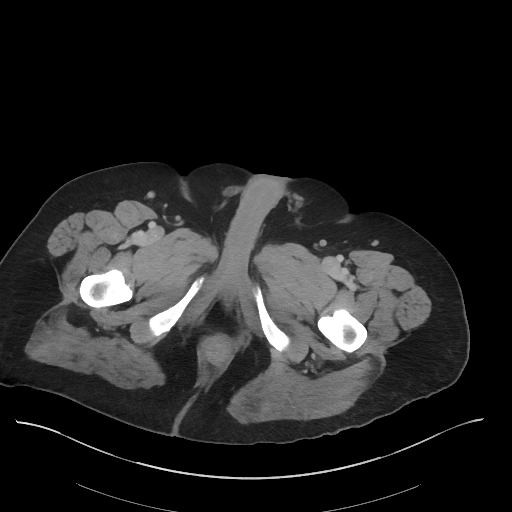
[im 6/97  bone]
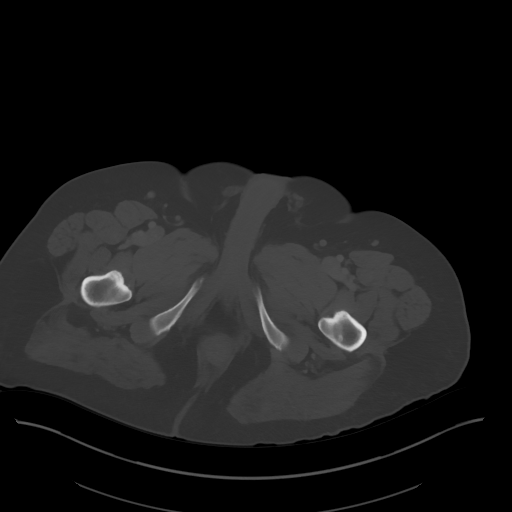
[im 16/97  soft-tissue]
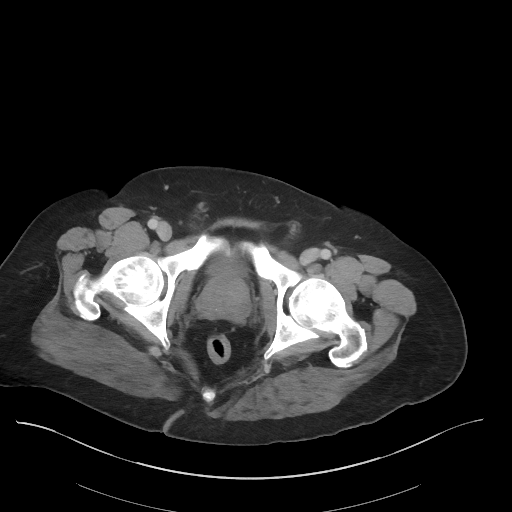
[im 21/97  soft-tissue]
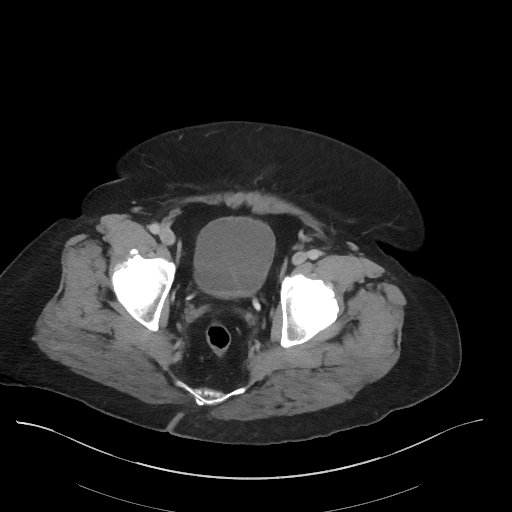
[im 26/97  soft-tissue]
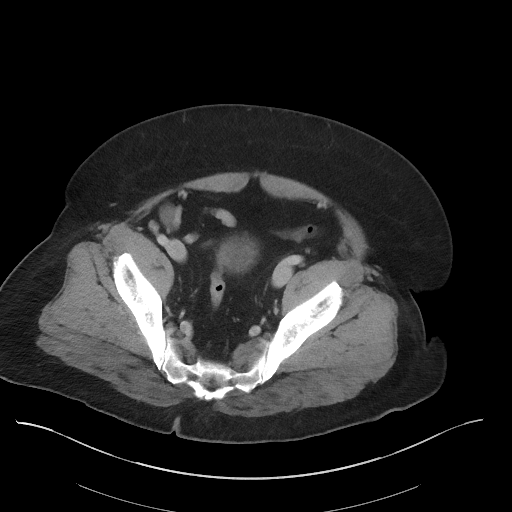
[im 36/97  soft-tissue]
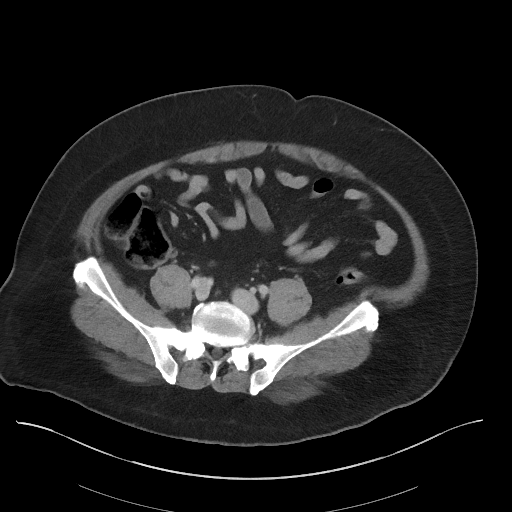
[im 41/97  soft-tissue]
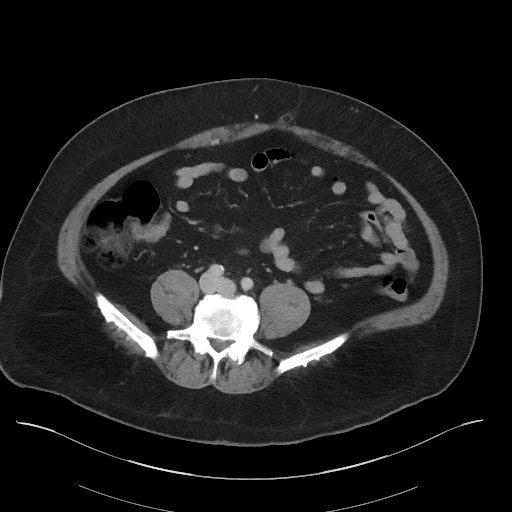
[im 51/97  soft-tissue]
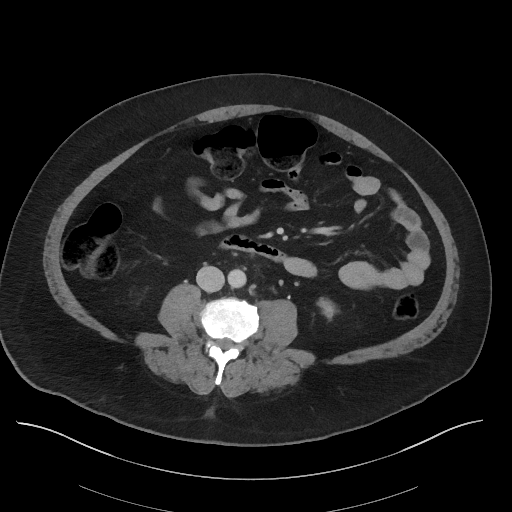
[im 56/97  soft-tissue]
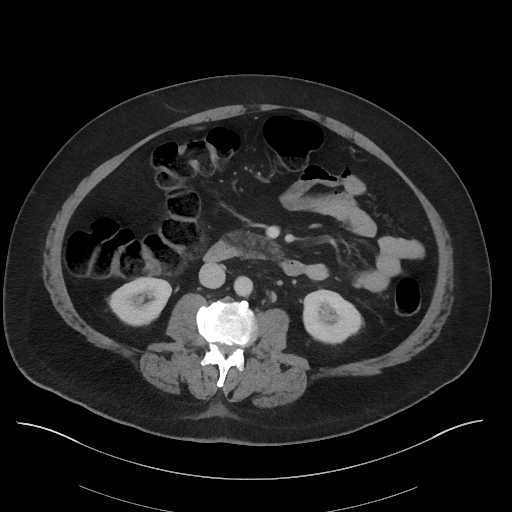
[im 61/97  soft-tissue]
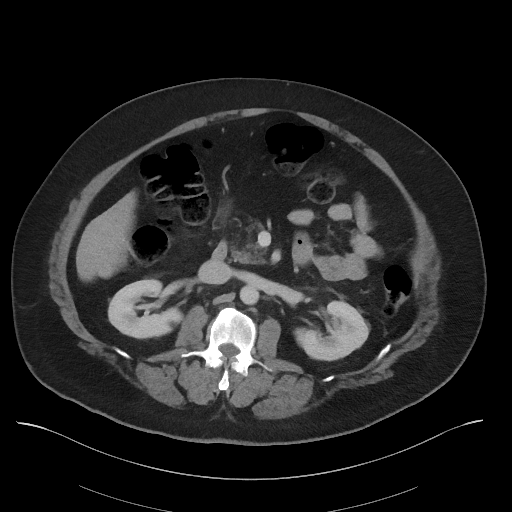
[im 61/97  bone]
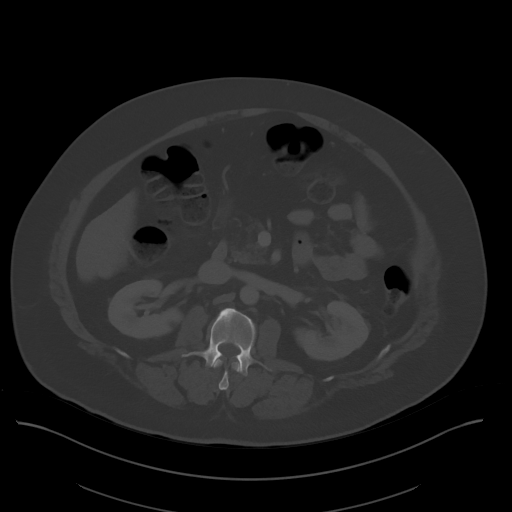
[im 71/97  soft-tissue]
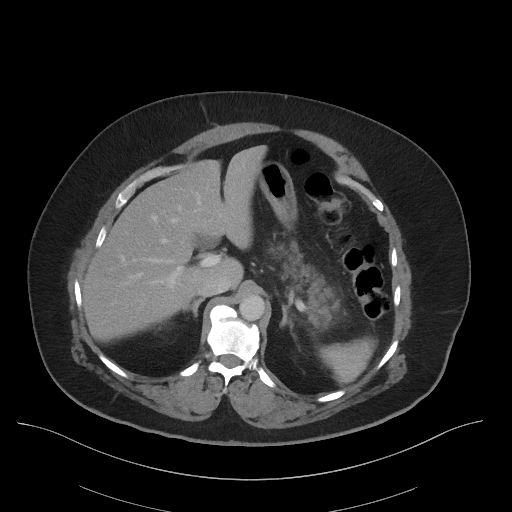
[im 76/97  soft-tissue]
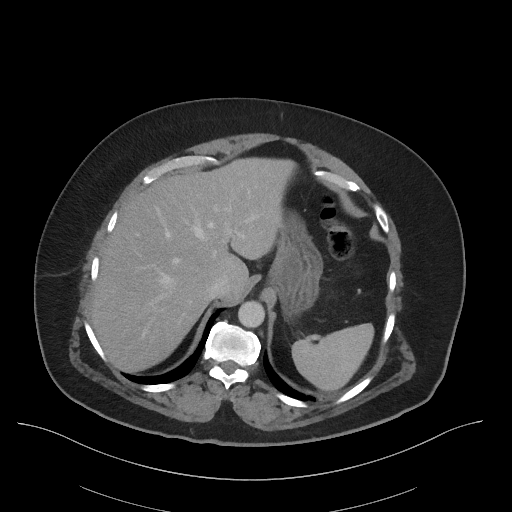
[im 81/97  soft-tissue]
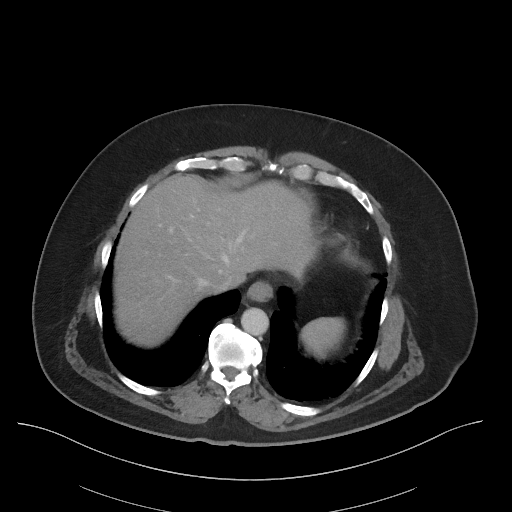
[im 91/97  soft-tissue]
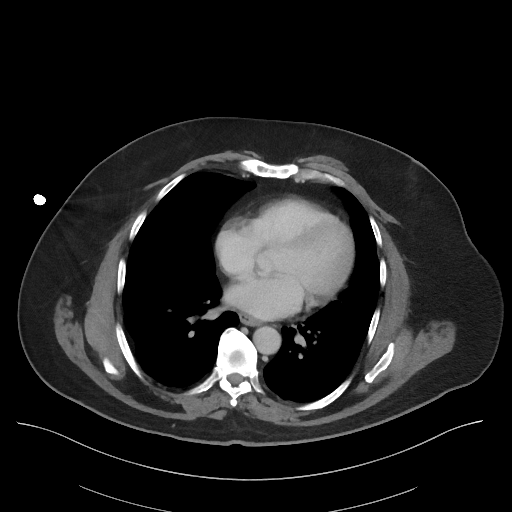

[Series 6: a/p w/ cor · coronal · 0.86mm/px · 3 of 151 slices shown]
[im 51/151  soft-tissue]
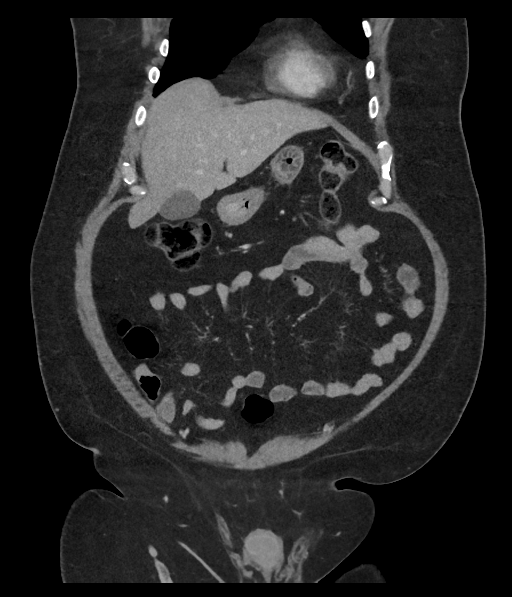
[im 67/151  soft-tissue]
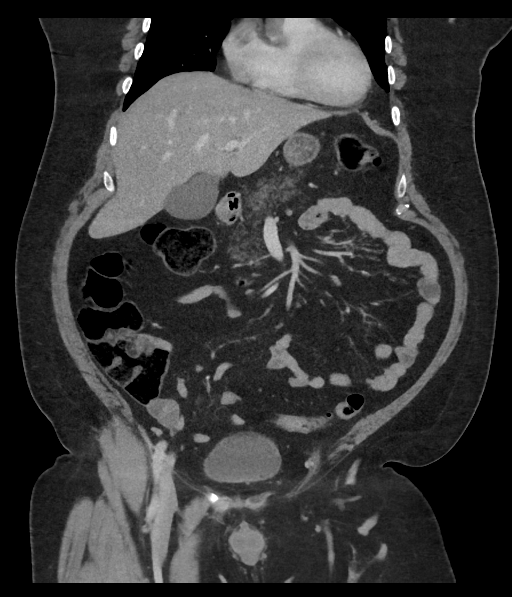
[im 84/151  soft-tissue]
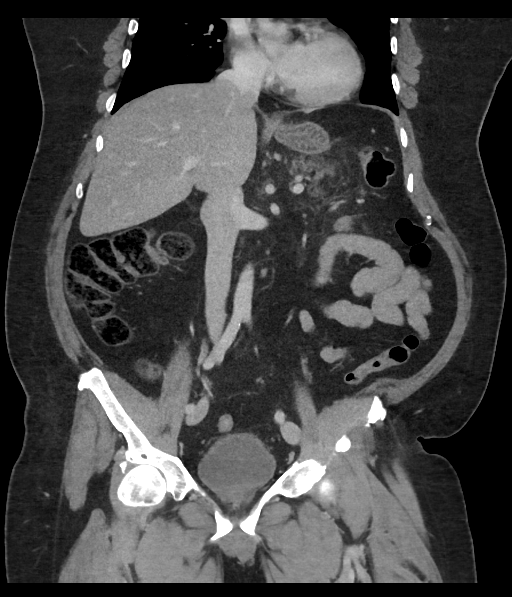

[16 of 46 positions shown; findings below may reference images not displayed]

FINDINGS: No pericardial or pleural effusion. Minor lower lobe atelectasis or
scarring.

Degenerative changes in the spine. No acute osseous abnormality
identified.

No pelvic free fluid. Negative rectum. Early IV contrast excretion
at the ureterovesical junctions, otherwise unremarkable urinary
bladder.

Decompressed sigmoid and left colon. Inflammation from the lesser
sac abutting the splenic flexure which otherwise appears negative.
Mildly gas and stool distended more proximal colon. Negative
appendix. No dilated small bowel. Small gastric hiatal hernia.
Largely decompressed stomach and duodenum.

Decreased density in the liver in keeping with steatosis. Otherwise
negative liver, gallbladder, spleen, adrenal glands. Renal
enhancement and contrast excretion within normal limits. Major
arterial structures are patent. Minimal iliac artery
atherosclerosis. Portal venous system including the splenic vein is
patent.

No abdominal free air or free fluid. Inflammation surrounding the
pancreas, more so the tail and body. Underlying fatty infiltration
of the pancreas. Thickening of the left para renal space Foss she a
and lateral Conal Foss show. No fluid collection.
IMPRESSION: 1. Acute pancreatitis primarily affecting the body and tail with no
complicating features at this time.
2. No other acute process in the abdomen or pelvis. Hepatic
steatosis.

## 2016-12-13 IMAGING — CR DG CHEST 2V
2 series · 2 of 2 positions shown · non-contrast
Comparison: 05/22/2006

CLINICAL DATA: Abdominal pain. History of hypertension and
diabetes.

EXAM:
CHEST  2 VIEW

[chest pa]
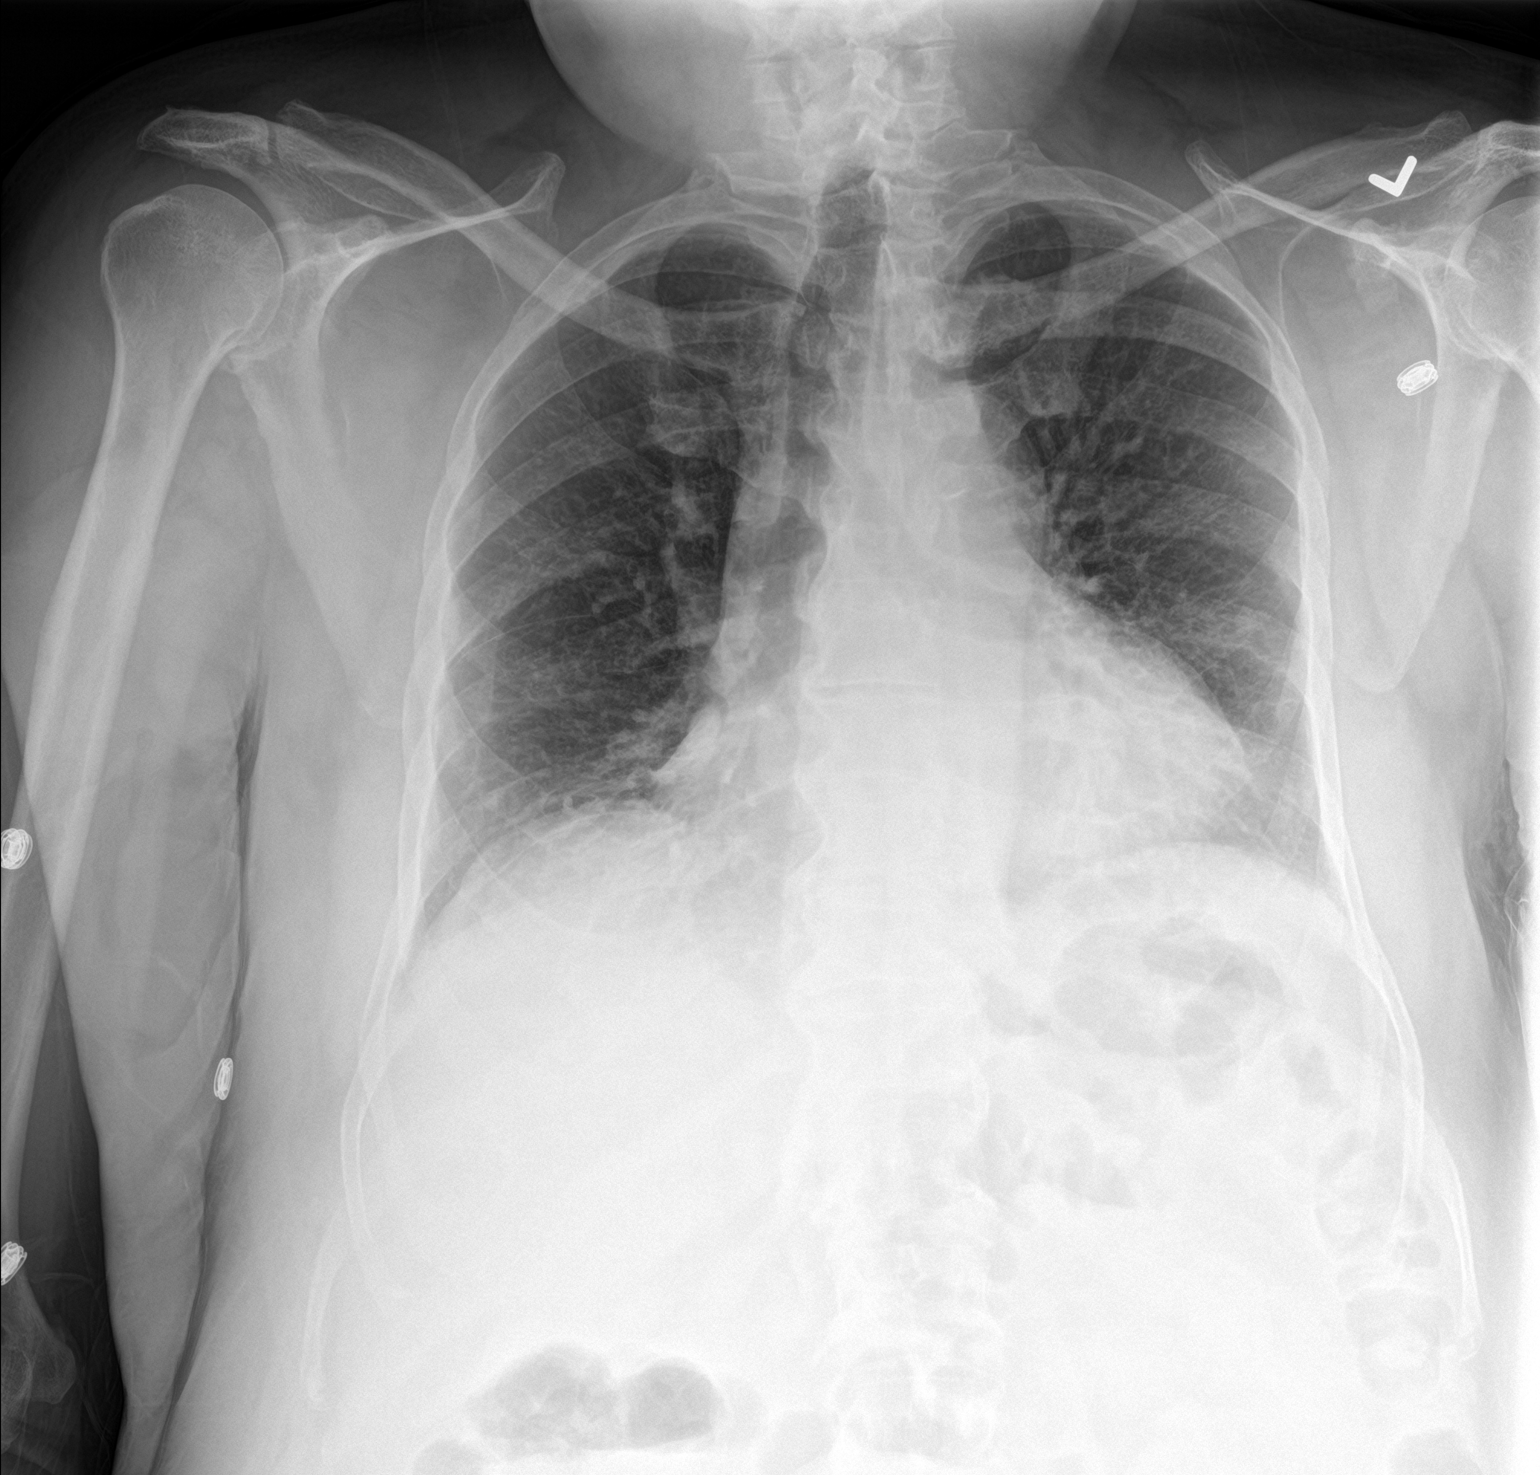

[chest lat]
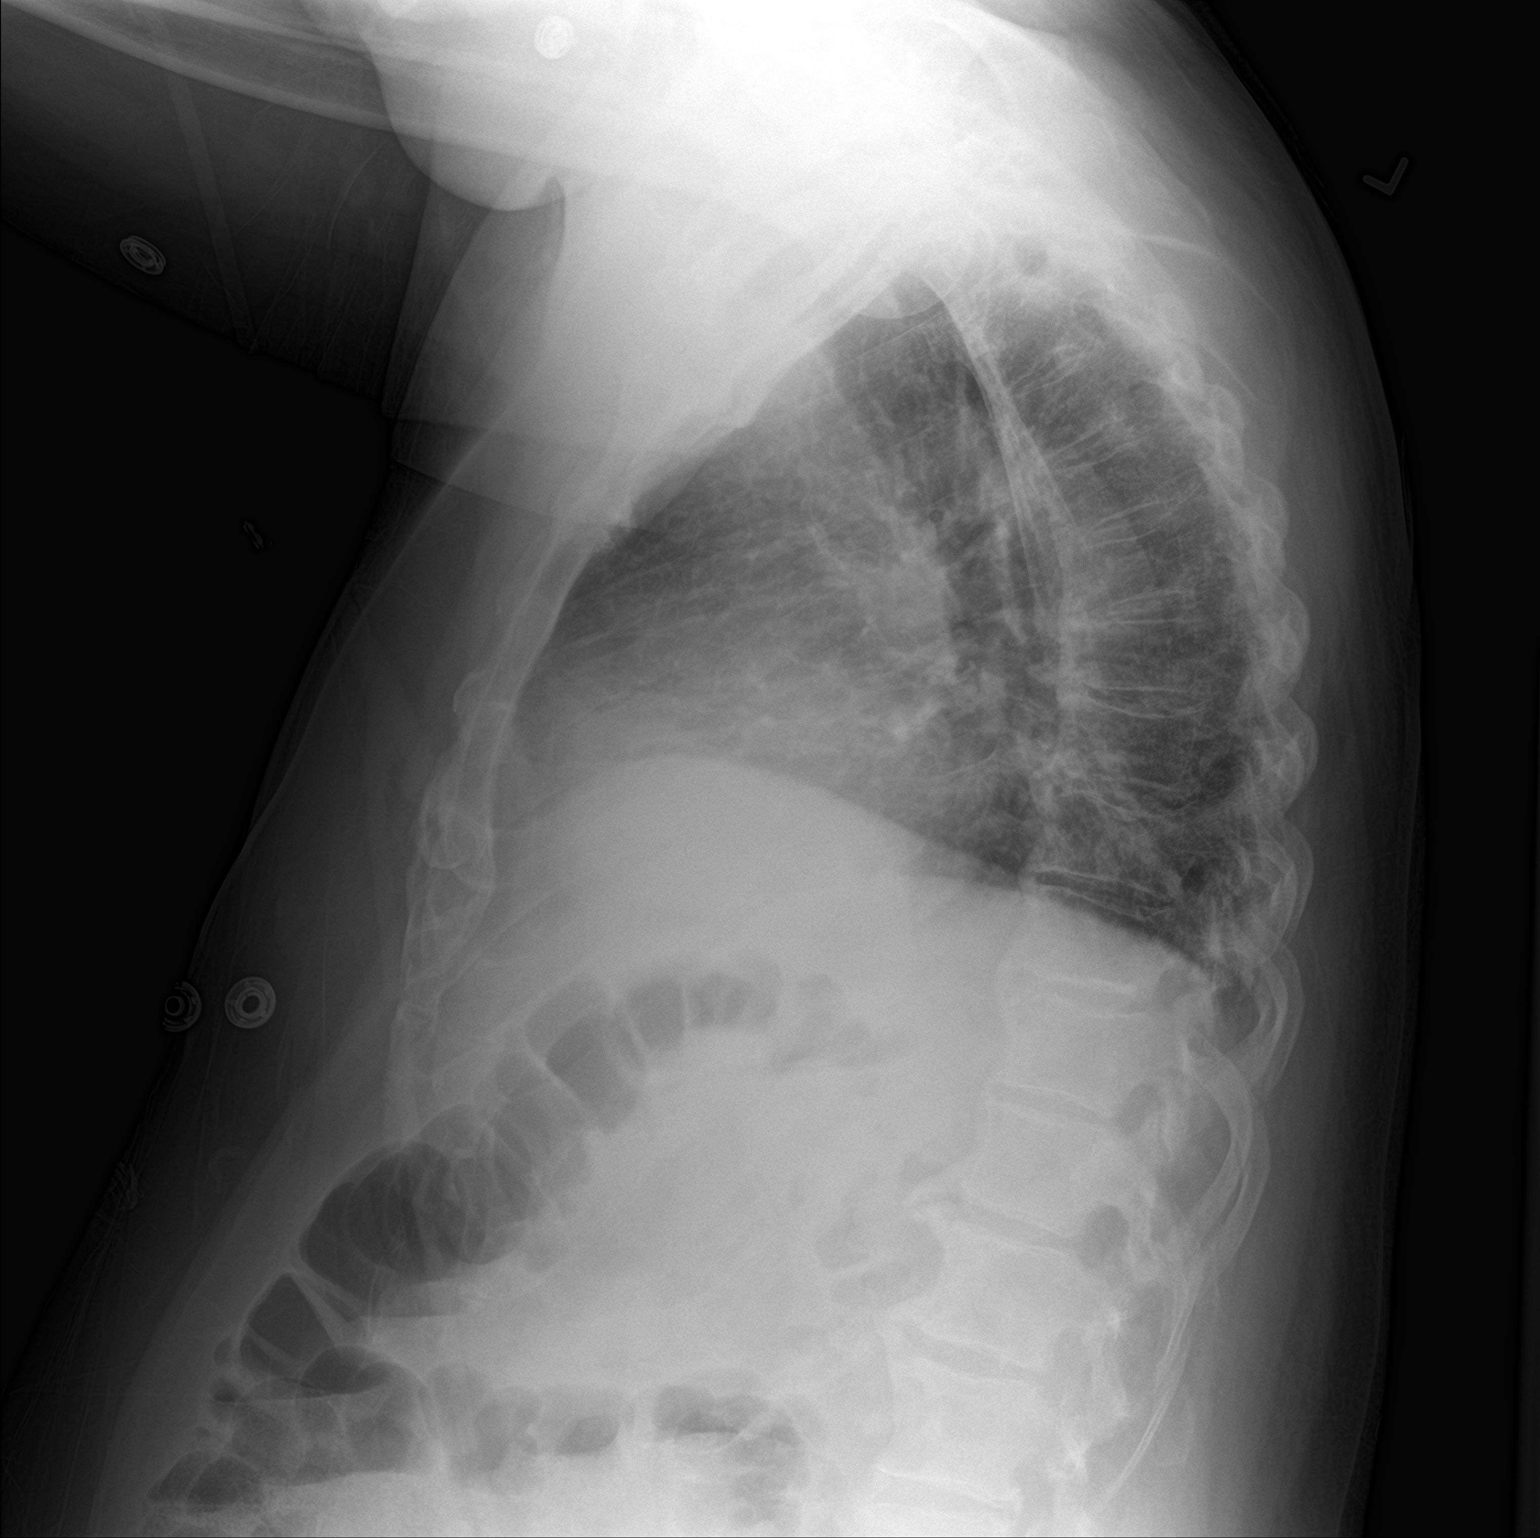

[2 of 2 positions shown; findings below may reference images not displayed]

FINDINGS: Shallow inspiration with atelectasis in the lung bases. Normal heart
size and pulmonary vascularity. No focal airspace disease or
consolidation in the lungs. No blunting of costophrenic angles. No
pneumothorax. Mediastinal contours appear intact. Degenerative
changes in the spine and shoulders.
IMPRESSION: Shallow inspiration with atelectasis in the lung bases.

## 2017-01-19 ENCOUNTER — Encounter: Payer: Self-pay | Admitting: Family Medicine

## 2017-04-12 ENCOUNTER — Encounter: Payer: Self-pay | Admitting: Pulmonary Disease

## 2017-04-12 ENCOUNTER — Ambulatory Visit: Payer: Medicare HMO | Admitting: Pulmonary Disease

## 2017-04-12 VITALS — BP 128/62 | HR 64 | Ht 67.0 in | Wt 228.6 lb

## 2017-04-12 DIAGNOSIS — Z9989 Dependence on other enabling machines and devices: Secondary | ICD-10-CM

## 2017-04-12 DIAGNOSIS — G4733 Obstructive sleep apnea (adult) (pediatric): Secondary | ICD-10-CM | POA: Diagnosis not present

## 2017-04-12 NOTE — Patient Instructions (Signed)
Follow up in 1 year.

## 2017-04-12 NOTE — Progress Notes (Signed)
Current Outpatient Medications on File Prior to Visit  Medication Sig  . aspirin 81 MG tablet Take 81 mg by mouth daily.  . brimonidine (ALPHAGAN) 0.15 % ophthalmic solution Place 1 drop into both eyes at bedtime.  Marland Kitchen levothyroxine (SYNTHROID, LEVOTHROID) 150 MCG tablet Take 150 mcg by mouth daily.  Marland Kitchen losartan (COZAAR) 100 MG tablet Take 1 tablet (100 mg total) by mouth daily.  Marland Kitchen LUMIGAN 0.01 % SOLN Place 1 drop into both eyes at bedtime.  . metFORMIN (GLUCOPHAGE) 1000 MG tablet Take 1,000 mg by mouth 2 (two) times daily with a meal.  . metoprolol (LOPRESSOR) 50 MG tablet Take 50 mg by mouth 2 (two) times daily.  . rosuvastatin (CRESTOR) 20 MG tablet Take 20 mg by mouth daily.   No current facility-administered medications on file prior to visit.      Chief Complaint  Patient presents with  . Follow-up    cpap follow up, no complaints     Sleep tests HST 07/24/16 >> AHI 32.2, SaO2 low 75% Auto CPAP 03/13/17 to 04/11/17 >> used on 30 of 30 nights with average 7 hrs 49 min.  Average AHI 0.9 with median CPAP 10 and 95 th percentile CPAP 14 cm H2O.  Past medical history DM, HTN, Blind, Hypothyroidism, HLD  Past surgical history, Family history, Social history, Allergies all reviewed.  Vital Signs BP 128/62 (BP Location: Left Arm, Cuff Size: Normal)   Pulse 64   Ht 5\' 7"  (1.702 m)   Wt 228 lb 9.6 oz (103.7 kg)   SpO2 99%   BMI 35.80 kg/m   History of Present Illness Scott Bass is a 66 y.o. male with obstructive sleep apnea.   Since his last visit he had a home sleep study.  This showed severe sleep apnea.  He was started on CPAP.  No issues with mask fit.  He is sleeping better, and feels more alert during the day.   Physical Exam  General - pleasant Eyes - blind ENT - no sinus tenderness, no oral exudate, no LAN Cardiac - regular, no murmur Chest - no wheeze, rales Abd - soft, non tender Ext - no edema Skin - no rashes Neuro - normal strength Psych - normal  mood    Assessment/Plan  Obstructive sleep apnea. - sleep study reviewed with him - he is compliant with therapy and reports benefit from CPAP - continue auto CPAP   Patient Instructions  Follow up in 1 year   Coralyn Helling, MD [facility] Pager:  [phone] 04/12/2017, 3:16 PM

## 2017-08-11 ENCOUNTER — Other Ambulatory Visit: Payer: Self-pay | Admitting: Family Medicine

## 2017-12-17 DIAGNOSIS — R9439 Abnormal result of other cardiovascular function study: Secondary | ICD-10-CM

## 2017-12-17 DIAGNOSIS — I493 Ventricular premature depolarization: Secondary | ICD-10-CM

## 2017-12-17 NOTE — Progress Notes (Signed)
12/07/2017: Glucose 151, Creatinine 1.2, Potassium 4.4, BMP otherwise normal. RBC 3.72, hgb 10, Hct 32.3, MCHC 31, CBC othewise normal.

## 2017-12-17 NOTE — H&P (Signed)
Scott Bass 12/07/2017 10:00 AM Location: [facility] Patient #: [id number] DOB: 06/18/1950 Married / Language: English / Race: Black or African American Male   History of Present Illness (Ashton H. Kelley FNP-C; 12/07/2017 10:45 AM) Patient words: Last O/V 11/27/2017; F/U Nuc results.  The patient is a 67 year old male who presents for a follow-up for Chest pain. Patient has a past medical history of hypertension, hyperlipidemia, OSA on CPAP, and type 2 diabetes. He was last seen by us in 2013 with normal nuclear stress test. 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  Underwent echocardiogram on 11/20/2017 revealing mild decrease in LVEF at 49%, otherwise no significant changes compared to 2012. 24 hour Holter monitor revealed PVC burden of 16%. Due to increased frequency of PVCs and symptoms underwent Lexiscan nuclear stress testing and now presents to discuss results.  Symptoms remain unchanged. He is on high-dose beta blocker.  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 No family history of heart disease.   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 Stress EKG is non diagnostic due to pharmacologic stress with Lexiscan. PVC persisted. Stress symptoms included chest pressure. 2. Left ventricular cavity is noted to be enlarged on the rest and stress studies at 172 mL. This is an abnormal myocardial perfusion imaging study demonstrating a mixture of scar plus ischemia in the basal inferior, mid inferior and apical inferior myocardial wall(s). Gated SPECT imaging demonstrates  hypokinesis of the basal inferior, mid inferior and apical inferior myocardial wall(s). The left ventricular ejection fraction was calculated to be 41%. 3. This is an intermediate risk study, clinical correlation recommended. Holter Monitor 24 hours 11/05/2017: Total PVC burden 16%. Total 17,000 PVC. 178 couplets, 13,000 V- Bigeminy. Rare PAC. No symptoms reported. Essential hypertension, benign (I10)  Type 2 diabetes mellitus with diabetic neuropathy, without long-term current use of insulin (E11.40)  Pure hypercholesterolemia (E78.00)  Atypical chest pain (R07.89)  EKG 11/02/2017: Sinus rhythm 65 bpm. Ventricular bigeminy. No ischemic changes. Dyspnea on exertion (R06.09)  Echocardiogram 11/20/2017: Frequent PVC's seen. Left ventricle cavity is normal in size. Mild concentric hypertrophy of the left ventricle. Mild decrease in global wall motion. Normal diastolic filling pattern. Calculated EF 49%. Left atrial cavity is mildly dilated. Mild (Grade I) mitral regurgitation. Inadequate tricuspid regurgitation jet to estimate pulmonary artery pressure. IVC is dilated with blunted respiratory response. This may suggest elevated right heart pressure. Compared to previous study on 02/15/2011, LVEF is mildly reduced. Laboratory examination (Z01.89)   Allergies (April Garrison; 12/07/2017 9:09 AM) hydroCHLOROthiazide *DIURETICS*  pancreatitis  Family History (April Garrison; 12/07/2017 9:09 AM) Mother  Deceased. unsure; Maybe DM Father  unsure Sister 2  1 passed; 1 living has breast cancer  Social History (April Garrison; 12/07/2017 9:09 AM) Current tobacco use  Never smoker. Alcohol Use  Occasional alcohol use. Marital status  Married. Living Situation  Lives with spouse. Number of Children  0.  Past Surgical History (April Garrison; 12/07/2017 9:09 AM) Rhinoplasty 2006.   Medication History (April Garrison; 12/07/2017 9:18 AM) Metoprolol Tartrate (100MG  Tablet, 1 and 1/2 Oral daily)  Active. metFORMIN HCl (1000MG  Tablet, 1 Oral daily) Active. Levothyroxine Sodium (<MEASUREMGeralyn Corwin1 Oral daily) Active. Rosuvastatin Calcium (10MG  Tablet,  1 Oral daily) Active. Losartan Potassium (100MG  Tablet, 1 Oral daily) Active. Brimonidine Tartrate (0.2% Solution, Ophthalmic daily) Active. Latanoprost (0.005% Solution, Ophthalmic daily) Active. Aspirin EC (81MG  Tablet DR, 1 Oral daily) Active. Medications Reconciled (Pt brought medications)  Diagnostic Studies History (April Garrison; 17-Dec-2017 9:15 AM) Sleep study 03/22/11 Sleep apnea fitted with CPAP at 9 cm water.  Echocardiogram  11/20/2017: Frequent PVC's seen. Left ventricle cavity is normal in size. Mild concentric hypertrophy of the left ventricle. Mild decrease in global wall motion. Normal diastolic filling pattern. Calculated EF 49%. Left atrial cavity is mildly dilated. Mild (Grade I) mitral regurgitation. Inadequate tricuspid regurgitation jet to estimate pulmonary artery pressure. IVC is dilated with blunted respiratory response. This may suggest elevated right heart pressure. Compared to previous study on 02/15/2011, LVEF is mildly reduced. Holter Monitor  24 hours 11/05/2017: Total PVC burden 16%. Total 17,000 PVC. 178 couplets, 13,000 V- Bigeminy. Rare PAC. No symptoms reported. Nuclear stress test  11/30/2017: 1.The resting electrocardiogram demonstrated normal sinus rhythm, normal resting conduction and frequent PVC in bigeminal pattern. Stress EKG is non diagnostic due to pharmacologic stress with Lexiscan. PVC persisted. Stress symptoms included chest pressure. 2. Left ventricular cavity is noted to be enlarged on the rest and stress studies at 172 mL. This is an abnormal myocardial perfusion imaging study demonstrating a mixture of scar plus ischemia in the basal inferior, mid inferior and apical inferior myocardial wall(s). Gated SPECT imaging demonstrates hypokinesis of the basal inferior, mid inferior  and apical inferior myocardial wall(s). The left ventricular ejection fraction was calculated to be 41%. 3. This is an intermediate risk study, clinical correlation recommended.    Review of Systems Suzy Bouchard, FNP-C; 12-17-17 10:45 AM) General Not Present- Appetite Loss and Weight Gain. HEENT Present- Visual Loss (chronic). Respiratory Not Present- Chronic Cough and Wakes up from Sleep Wheezing or Short of Breath. Cardiovascular Present- Chest Pain, Claudications, Difficulty Breathing On Exertion and Paroxysmal Nocturnal Dyspnea. Not Present- Difficulty Breathing Lying Down. Gastrointestinal Not Present- Black, Tarry Stool and Difficulty Swallowing. Musculoskeletal Not Present- Decreased Range of Motion and Muscle Atrophy. Neurological Not Present- Attention Deficit. Psychiatric Not Present- Personality Changes and Suicidal Ideation. Endocrine Not Present- Cold Intolerance and Heat Intolerance. Hematology Not Present- Abnormal Bleeding. All other systems negative  Vitals (April Garrison; 12-17-17 9:16 AM) 17-Dec-2017 9:13 AM Weight: 222 lb Height: 67in Body Surface Area: 2.11 m Body Mass Index: 34.77 kg/m  Pulse: 71 (Regular)  P.OX: 98% (Room air) BP: 157/75 (Sitting, Left Arm, Standard)       Physical Exam Suzy Bouchard, FNP-C; December 17, 2017 10:45 AM) General Mental Status-Alert. General Appearance-Cooperative, Appears stated age, Not in acute distress. Build & Nutrition-Well built and Morbidly obese.  Head and Neck Neck -Note: Short neck and difficult to evaluate JVD.  Thyroid Gland Characteristics - no palpable nodules, no palpable enlargement.  Cardiovascular Cardiovascular examination reveals -normal heart sounds, regular rate and rhythm with no murmurs. Inspection Jugular vein - Right - No Distention.  Abdomen Inspection Contour - Obese and Pannus present. Palpation/Percussion Normal exam - Non Tender and No  hepatosplenomegaly. Auscultation Normal exam - Bowel sounds normal.  Peripheral Vascular Lower Extremity Inspection - Bilateral - Inspection Normal. Palpation - Edema - Bilateral - 1+ Pitting edema. Femoral pulse - Bilateral - 2+, No Bruits. Popliteal pulse - Left - 1+. Right - 2+. Bilateral - Feeble(Pulsus difficult to feel due to patient's bodily habitus.). Dorsalis pedis pulse - Bilateral - Normal. Posterior tibial pulse - Bilateral - Normal. Carotid  arteries - Bilateral-No Carotid bruit.  Neurologic Neurologic evaluation reveals -alert and oriented x 3 with no impairment of recent or remote memory. Motor-Grossly intact without any focal deficits.  Musculoskeletal Global Assessment Left Lower Extremity - normal range of motion without pain. Right Lower Extremity - normal range of motion without pain.   Results Suzy Bouchard FNP-C; 12/07/2017 10:40 AM) Procedures  Name Value Date Myocardial perfusion imaging, tomographic (SPECT) (including attenuation correction, qualitative or quantitative wall motion, ejection fraction by first pass or gated technique, additional quantification, when performed); multiple studies, Comments: Lexiscan myoview stress test 11/30/2017: 1.The resting electrocardiogram demonstrated normal sinus rhythm, normal resting conduction and frequent PVC in bigeminal pattern. Stress EKG is non diagnostic due to pharmacologic stress with Lexiscan. PVC persisted. Stress symptoms included chest pressure. 2. Left ventricular cavity is noted to be enlarged on the rest and stress studies at 172 mL. This is an abnormal myocardial perfusion imaging study demonstrating a mixture of scar plus ischemia in the basal inferior, mid inferior and apical inferior myocardial wall(s).  Gated SPECT imaging demonstrates hypokinesis of the basal inferior, mid inferior and apical inferior myocardial wall(s). The left ventricular ejection fraction was calculated to be 41%.  3.  This is an intermediate risk study, clinical correlation recommended.  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 Stress EKG is non diagnostic due to pharmacologic stress with Lexiscan. PVC persisted. Stress symptoms included chest pressure. 2. Left ventricular cavity is noted to be enlarged on the rest and stress studies at 172 mL. This is an abnormal myocardial perfusion imaging study demonstrating a mixture of scar plus ischemia in the basal inferior, mid inferior and apical inferior myocardial wall(s). Gated SPECT imaging demonstrates hypokinesis of the basal inferior, mid inferior and apical inferior myocardial wall(s). The left ventricular ejection fraction was calculated to be 41%. 3. This is an intermediate risk study, clinical correlation recommended.  Holter Monitor 24 hours 11/05/2017: Total PVC burden 16%. Total 17,000 PVC. 178 couplets, 13,000 V- Bigeminy. Rare PAC. No symptoms reported. Impression: EKG 04/08/2012 NSR @ 60/min. Normal intervals. No ischemia. normal EKG. Atypical chest pain (R07.89) Story: EKG 11/02/2017: Sinus rhythm 65 bpm. Ventricular bigeminy. No ischemic changes. Current Plans METABOLIC PANEL, BASIC (81191) CBC & PLATELETS (AUTO) (47829) Dyspnea on exertion (R06.09) Story: Echocardiogram 11/20/2017: Frequent PVC's seen. Left ventricle cavity is normal in size. Mild concentric hypertrophy of the left ventricle. Mild decrease in global wall motion. Normal diastolic filling pattern. Calculated EF 49%. Left atrial cavity is mildly dilated. Mild (Grade I) mitral regurgitation. Inadequate tricuspid regurgitation jet to estimate pulmonary artery pressure. IVC is dilated with blunted  respiratory response. This may suggest elevated right heart pressure. Compared to previous study on 02/15/2011, LVEF is mildly reduced. Type 2 diabetes mellitus with diabetic neuropathy, without long-term current use of insulin (E11.40) Laboratory examination (Z01.89)  Note:. Recommendation:  Patient presents for follow-up after testing. Due to frequent PVCs and systolic dysfunction noted on recent echocardiogram, underwent Lexiscan nuclear stress testing that was considered intermediate risk. Continues to be symptomatic. I'm concerned about these findings and feel he needs further evaluation with coronary angiogram. Schedule for cardiac catheterization, and possible angioplasty. We discussed regarding risks, benefits, alternatives to this including stress testing, CTA and continued medical therapy. Patient wants to proceed. Understands <  1-2% risk of death, stroke, MI, urgent CABG, bleeding, infection, renal failure but not limited to these. If coronary angiogram as unyielding, may need EP evaluation for PVC ablation. He is on appropriate medical therapy, no changes were made to medications today. We'll see him back after the procedure for follow-up.  *I have discussed this case with Dr. Jacinto Halim and he personally examined the patient and participated in formulating the plan.*  CC: Dr. Tracey Harries (PCP)    Signed by Suzy Bouchard, FNP-C (12/07/2017 10:46 AM)

## 2017-12-18 ENCOUNTER — Encounter (HOSPITAL_COMMUNITY)
Admission: RE | Disposition: A | Discharge status: Home / Self Care | Payer: Self-pay | Source: Ambulatory Visit | Attending: Cardiology

## 2017-12-18 ENCOUNTER — Encounter (HOSPITAL_COMMUNITY): Payer: Self-pay | Admitting: Cardiology

## 2017-12-18 ENCOUNTER — Ambulatory Visit (HOSPITAL_COMMUNITY)
Admission: RE | Admit: 2017-12-18 | Discharge: 2017-12-19 | Disposition: A | Discharge status: Home / Self Care | Payer: Medicare HMO | Source: Ambulatory Visit | Attending: Cardiology | Admitting: Cardiology

## 2017-12-18 ENCOUNTER — Other Ambulatory Visit: Payer: Self-pay

## 2017-12-18 DIAGNOSIS — R0609 Other forms of dyspnea: Secondary | ICD-10-CM | POA: Insufficient documentation

## 2017-12-18 DIAGNOSIS — I10 Essential (primary) hypertension: Secondary | ICD-10-CM | POA: Diagnosis not present

## 2017-12-18 DIAGNOSIS — E039 Hypothyroidism, unspecified: Secondary | ICD-10-CM | POA: Diagnosis not present

## 2017-12-18 DIAGNOSIS — R0789 Other chest pain: Secondary | ICD-10-CM | POA: Insufficient documentation

## 2017-12-18 DIAGNOSIS — Z79890 Hormone replacement therapy (postmenopausal): Secondary | ICD-10-CM | POA: Insufficient documentation

## 2017-12-18 DIAGNOSIS — I493 Ventricular premature depolarization: Secondary | ICD-10-CM | POA: Diagnosis not present

## 2017-12-18 DIAGNOSIS — Z7984 Long term (current) use of oral hypoglycemic drugs: Secondary | ICD-10-CM | POA: Insufficient documentation

## 2017-12-18 DIAGNOSIS — I340 Nonrheumatic mitral (valve) insufficiency: Secondary | ICD-10-CM | POA: Insufficient documentation

## 2017-12-18 DIAGNOSIS — Z6834 Body mass index (BMI) 34.0-34.9, adult: Secondary | ICD-10-CM | POA: Diagnosis not present

## 2017-12-18 DIAGNOSIS — G4733 Obstructive sleep apnea (adult) (pediatric): Secondary | ICD-10-CM | POA: Diagnosis not present

## 2017-12-18 DIAGNOSIS — R9439 Abnormal result of other cardiovascular function study: Secondary | ICD-10-CM | POA: Diagnosis present

## 2017-12-18 DIAGNOSIS — Z9889 Other specified postprocedural states: Secondary | ICD-10-CM | POA: Insufficient documentation

## 2017-12-18 DIAGNOSIS — E1140 Type 2 diabetes mellitus with diabetic neuropathy, unspecified: Secondary | ICD-10-CM | POA: Insufficient documentation

## 2017-12-18 DIAGNOSIS — E785 Hyperlipidemia, unspecified: Secondary | ICD-10-CM | POA: Insufficient documentation

## 2017-12-18 DIAGNOSIS — Z888 Allergy status to other drugs, medicaments and biological substances status: Secondary | ICD-10-CM | POA: Diagnosis not present

## 2017-12-18 DIAGNOSIS — Z7982 Long term (current) use of aspirin: Secondary | ICD-10-CM | POA: Insufficient documentation

## 2017-12-18 HISTORY — DX: Obstructive sleep apnea (adult) (pediatric): G47.33

## 2017-12-18 HISTORY — DX: Unspecified glaucoma: H40.9

## 2017-12-18 HISTORY — DX: Hypothyroidism, unspecified: E03.9

## 2017-12-18 HISTORY — DX: Pure hypercholesterolemia, unspecified: E78.00

## 2017-12-18 HISTORY — DX: Unspecified osteoarthritis, unspecified site: M19.90

## 2017-12-18 HISTORY — DX: Type 2 diabetes mellitus without complications: E11.9

## 2017-12-18 HISTORY — PX: LEFT HEART CATH AND CORONARY ANGIOGRAPHY: CATH118249

## 2017-12-18 HISTORY — DX: Gastro-esophageal reflux disease without esophagitis: K21.9

## 2017-12-18 HISTORY — DX: Deficiency of other specified B group vitamins: E53.8

## 2017-12-18 LAB — GLUCOSE, CAPILLARY
GLUCOSE-CAPILLARY: 106 mg/dL — AB (ref 70–99)
GLUCOSE-CAPILLARY: 82 mg/dL (ref 70–99)
Glucose-Capillary: 112 mg/dL — ABNORMAL HIGH (ref 70–99)
Glucose-Capillary: 126 mg/dL — ABNORMAL HIGH (ref 70–99)

## 2017-12-18 SURGERY — LEFT HEART CATH AND CORONARY ANGIOGRAPHY
Anesthesia: LOCAL

## 2017-12-18 MED ORDER — SODIUM CHLORIDE 0.9% FLUSH: 3.0000 mL | Freq: Two times a day (BID) | INTRAVENOUS | Status: DC

## 2017-12-18 MED ORDER — LIDOCAINE HCL (PF) 1 % IJ SOLN
INTRAMUSCULAR | Status: DC | PRN
  Administered 2017-12-18: 2 mL

## 2017-12-18 MED ORDER — FENTANYL CITRATE (PF) 100 MCG/2ML IJ SOLN
INTRAMUSCULAR | Status: DC | PRN
  Administered 2017-12-18: 50 ug via INTRAVENOUS

## 2017-12-18 MED ORDER — ROSUVASTATIN CALCIUM 10 MG PO TABS
10.0000 mg | ORAL_TABLET | Freq: Every day | ORAL | Status: DC
  Administered 2017-12-18: 17:00:00 10 mg via ORAL
  Filled 2017-12-18: qty 1

## 2017-12-18 MED ORDER — BRIMONIDINE TARTRATE 0.2 % OP SOLN
1.0000 [drp] | Freq: Two times a day (BID) | OPHTHALMIC | Status: DC
  Administered 2017-12-18: 20:00:00 1 [drp] via OPHTHALMIC
  Filled 2017-12-18: qty 5

## 2017-12-18 MED ORDER — LABETALOL HCL 5 MG/ML IV SOLN
10.0000 mg | Freq: Four times a day (QID) | INTRAVENOUS | Status: DC | PRN
  Administered 2017-12-18: 17:00:00 10 mg via INTRAVENOUS
  Filled 2017-12-18: qty 4

## 2017-12-18 MED ORDER — ASPIRIN 81 MG PO CHEW: 81.0000 mg | CHEWABLE_TABLET | ORAL | Status: DC

## 2017-12-18 MED ORDER — SODIUM CHLORIDE 0.9 % IV SOLN
INTRAVENOUS | Status: DC
  Administered 2017-12-18: 11:00:00 via INTRAVENOUS

## 2017-12-18 MED ORDER — SODIUM CHLORIDE 0.9 % IV SOLN: 250.0000 mL | INTRAVENOUS | Status: DC | PRN

## 2017-12-18 MED ORDER — MIDAZOLAM HCL 2 MG/2ML IJ SOLN
INTRAMUSCULAR | Status: DC | PRN
  Administered 2017-12-18: 1 mg via INTRAVENOUS

## 2017-12-18 MED ORDER — HEPARIN (PORCINE) IN NACL 1000-0.9 UT/500ML-% IV SOLN
INTRAVENOUS | Status: AC
  Filled 2017-12-18: qty 500

## 2017-12-18 MED ORDER — ONDANSETRON HCL 4 MG/2ML IJ SOLN: 4.0000 mg | Freq: Four times a day (QID) | INTRAMUSCULAR | Status: DC | PRN

## 2017-12-18 MED ORDER — HEPARIN (PORCINE) IN NACL 1000-0.9 UT/500ML-% IV SOLN
INTRAVENOUS | Status: DC | PRN
  Administered 2017-12-18 (×2): 500 mL

## 2017-12-18 MED ORDER — INSULIN ASPART 100 UNIT/ML ~~LOC~~ SOLN
0.0000 [IU] | Freq: Three times a day (TID) | SUBCUTANEOUS | Status: DC
  Administered 2017-12-19: 07:00:00 2 [IU] via SUBCUTANEOUS

## 2017-12-18 MED ORDER — ROSUVASTATIN CALCIUM 20 MG PO CPSP: 10.0000 mg | ORAL_CAPSULE | Freq: Every day | ORAL | Status: DC

## 2017-12-18 MED ORDER — LOSARTAN POTASSIUM 50 MG PO TABS
100.0000 mg | ORAL_TABLET | Freq: Every day | ORAL | Status: DC
  Filled 2017-12-18: qty 2

## 2017-12-18 MED ORDER — LATANOPROST 0.005 % OP SOLN
1.0000 [drp] | Freq: Every day | OPHTHALMIC | Status: DC
  Administered 2017-12-18: 1 [drp] via OPHTHALMIC
  Filled 2017-12-18: qty 2.5

## 2017-12-18 MED ORDER — IOPAMIDOL (ISOVUE-370) INJECTION 76%
INTRAVENOUS | Status: AC
  Filled 2017-12-18: qty 100

## 2017-12-18 MED ORDER — FENTANYL CITRATE (PF) 100 MCG/2ML IJ SOLN
INTRAMUSCULAR | Status: AC
  Filled 2017-12-18: qty 2

## 2017-12-18 MED ORDER — METOPROLOL TARTRATE 50 MG PO TABS
100.0000 mg | ORAL_TABLET | Freq: Every day | ORAL | Status: DC
  Filled 2017-12-18: qty 2

## 2017-12-18 MED ORDER — HEPARIN SODIUM (PORCINE) 1000 UNIT/ML IJ SOLN
INTRAMUSCULAR | Status: DC | PRN
  Administered 2017-12-18: 5000 [IU] via INTRAVENOUS

## 2017-12-18 MED ORDER — VERAPAMIL HCL 2.5 MG/ML IV SOLN
INTRAVENOUS | Status: AC
  Filled 2017-12-18: qty 2

## 2017-12-18 MED ORDER — LEVOTHYROXINE SODIUM 150 MCG PO TABS
150.0000 ug | ORAL_TABLET | Freq: Every day | ORAL | Status: DC
  Administered 2017-12-19: 150 ug via ORAL
  Filled 2017-12-18: qty 1
  Filled 2017-12-18: qty 2

## 2017-12-18 MED ORDER — HEPARIN SODIUM (PORCINE) 1000 UNIT/ML IJ SOLN
INTRAMUSCULAR | Status: AC
  Filled 2017-12-18: qty 1

## 2017-12-18 MED ORDER — LABETALOL HCL 5 MG/ML IV SOLN: 10.0000 mg | Freq: Four times a day (QID) | INTRAVENOUS | Status: DC | PRN

## 2017-12-18 MED ORDER — SODIUM CHLORIDE 0.9% FLUSH: 3.0000 mL | INTRAVENOUS | Status: DC | PRN

## 2017-12-18 MED ORDER — MIDAZOLAM HCL 2 MG/2ML IJ SOLN
INTRAMUSCULAR | Status: AC
  Filled 2017-12-18: qty 2

## 2017-12-18 MED ORDER — ACETAMINOPHEN 325 MG PO TABS: 650.0000 mg | ORAL_TABLET | ORAL | Status: DC | PRN

## 2017-12-18 MED ORDER — SODIUM CHLORIDE 0.9 % IV SOLN
INTRAVENOUS | Status: AC
  Administered 2017-12-18: 13:00:00 via INTRAVENOUS

## 2017-12-18 MED ORDER — VERAPAMIL HCL 2.5 MG/ML IV SOLN
INTRAVENOUS | Status: DC | PRN
  Administered 2017-12-18: 5 mL via INTRA_ARTERIAL

## 2017-12-18 MED ORDER — METOPROLOL TARTRATE 25 MG PO TABS
50.0000 mg | ORAL_TABLET | Freq: Every day | ORAL | Status: DC
  Administered 2017-12-18: 19:00:00 50 mg via ORAL
  Filled 2017-12-18: qty 2

## 2017-12-18 MED ORDER — LIDOCAINE HCL (PF) 1 % IJ SOLN
INTRAMUSCULAR | Status: AC
  Filled 2017-12-18: qty 30

## 2017-12-18 MED ORDER — IOPAMIDOL (ISOVUE-370) INJECTION 76%
INTRAVENOUS | Status: DC | PRN
  Administered 2017-12-18: 55 mL via INTRA_ARTERIAL

## 2017-12-18 SURGICAL SUPPLY — 10 items
CATH 5FR JL3.5 JR4 ANG PIG MP (CATHETERS) ×2 IMPLANT
CATH LAUNCHER 6FR EBU 3 (CATHETERS) ×2 IMPLANT
DEVICE RAD COMP TR BAND LRG (VASCULAR PRODUCTS) ×2 IMPLANT
GLIDESHEATH SLEND A-KIT 6F 22G (SHEATH) ×2 IMPLANT
GUIDEWIRE INQWIRE 1.5J.035X260 (WIRE) ×1 IMPLANT
INQWIRE 1.5J .035X260CM (WIRE) ×2
KIT HEART LEFT (KITS) ×2 IMPLANT
PACK CARDIAC CATHETERIZATION (CUSTOM PROCEDURE TRAY) ×2 IMPLANT
TRANSDUCER W/STOPCOCK (MISCELLANEOUS) ×2 IMPLANT
TUBING CIL FLEX 10 FLL-RA (TUBING) ×2 IMPLANT

## 2017-12-18 NOTE — Progress Notes (Signed)
Patient setup on CPAP and tolerating well at this time. Will call if any assistance needed

## 2017-12-18 NOTE — Progress Notes (Signed)
Patient is visually impaired and uses public transport "[employer]" for commuting. Patient's wife Bonita Quin is also visually impaired and is not here with him today. I do not think it is safe for patient to use public transport to go home today. We will keep the patient here overnight, and discharge him tomorrow through [facility] service. I have educated patient and his wife regarding early signs of bleeding. I have asked them to look for warning signs that may prompt emergency call.  Elder Negus, MD [facility] Pager: [phone] Office: [phone] If no answer Cell [phone]

## 2017-12-18 NOTE — Interval H&P Note (Signed)
History and Physical Interval Note:  12/18/2017 11:19 AM  Scott Bass  has presented today for surgery, with the diagnosis of AST PVC  The various methods of treatment have been discussed with the patient and family. After consideration of risks, benefits and other options for treatment, the patient has consented to  Procedure(s): LEFT HEART CATH AND CORONARY ANGIOGRAPHY (N/A) as a surgical intervention .  The patient's history has been reviewed, patient examined, no change in status, stable for surgery.  I have reviewed the patient's chart and labs.  Questions were answered to the patient's satisfaction.    2016/2017 Appropriate Use Criteria for Coronary Revascularization Clinical Presentation: Diabetes Mellitus? Symptom Status? S/P CABG? Antianginal Therapy (# of long-acting drugs)? Results of Non-invasive testing? FFR/iFR results in all diseased vessels? Patient undergoing renal transplant? Patient undergoing percutaneous valve procedure (TAVR, MitraClip, Others)? Symptom Status:  Ischemic Symptoms  Non-invasive Testing:  Intermediate Risk  If no or indeterminate stress test, FFR/iFR results in all diseased vessels:  N/A  Diabetes Mellitus:  Yes  S/P CABG:  No  Antianginal therapy (number of long-acting drugs):  >=2  Patient undergoing renal transplant:  No  Patient undergoing percutaneous valve procedure:  No    newline 1 Vessel Disease PCI CABG  No proximal LAD involvement, No proximal left dominant LCX involvement A (8); Indication 2 M (6); Indication 2   Proximal left dominant LCX involvement A (8); Indication 5 A (8); Indication 5   Proximal LAD involvement A (8); Indication 5 A (8); Indication 5   newline 2 Vessel Disease  No proximal LAD involvement A (8); Indication 8 A (7); Indication 8   Proximal LAD involvement A (8); Indication 14 A (9); Indication 14   newline 3 Vessel Disease  Low disease complexity (e.g., focal stenoses, SYNTAX <=22) A (7); Indication 19 A (9);  Indication 19   Intermediate or high disease complexity (e.g., SYNTAX >=23) M (6); Indication 23 A (9); Indication 23   newline Left Main Disease  Isolated LMCA disease: ostial or midshaft A (7); Indication 24 A (9); Indication 24   Isolated LMCA disease: bifurcation involvement M (6); Indication 25 A (9); Indication 25   LMCA ostial or midshaft, concurrent low disease burden multivessel disease (e.g., 1-2 additional focal stenoses, SYNTAX <=22) A (7); Indication 26 A (9); Indication 26   LMCA ostial or midshaft, concurrent intermediate or high disease burden multivessel disease (e.g., 1-2 additional bifurcation stenoses, long stenoses, SYNTAX >=23) M (4); Indication 27 A (9); Indication 27   LMCA bifurcation involvement, concurrent low disease burden multivessel disease (e.g., 1-2 additional focal stenoses, SYNTAX <=22) M (6); Indication 28 A (9); Indication 28   LMCA bifurcation involvement, concurrent intermediate or high disease burden multivessel disease (e.g., 1-2 additional bifurcation stenoses, long stenoses, SYNTAX >=23) R (3); Indication 29 A (9); Indication 29      Shaia Porath J Zhoey Blackstock

## 2017-12-18 NOTE — Progress Notes (Signed)
[employer] BAND REMOVAL  LOCATION:    right radial  DEFLATED PER PROTOCOL:    Yes.    TIME BAND OFF / DRESSING APPLIED:    1600   SITE UPON ARRIVAL:    Level 0  SITE AFTER BAND REMOVAL:    Level 0  CIRCULATION SENSATION AND MOVEMENT:    Within Normal Limits   Yes.     COMMENTS:

## 2017-12-19 DIAGNOSIS — R9439 Abnormal result of other cardiovascular function study: Secondary | ICD-10-CM | POA: Diagnosis not present

## 2017-12-19 LAB — GLUCOSE, CAPILLARY: Glucose-Capillary: 128 mg/dL — ABNORMAL HIGH (ref 70–99)

## 2017-12-19 NOTE — Care Management Note (Signed)
Case Management Note  Patient Details  Name: Scott Bass MRN: [id number] Date of Birth: 08-15-1950  Subjective/Objective:   From home with wife, both are visually impaired, he had a normal cath.  He is for dc today and he states he rides the [employer]  Which he can call and activate himself, he does not need NCM to do this for him.  He will activate his transport when he is ready to go.                 Action/Plan: DC when ready.  Expected Discharge Date:  12/19/17               Expected Discharge Plan:  Home/Self Care  In-House Referral:     Discharge planning Services  CM Consult  Post Acute Care Choice:    Choice offered to:     DME Arranged:    DME Agency:     HH Arranged:    HH Agency:     Status of Service:  Completed, signed off  If discussed at [employer] of Stay Meetings, dates discussed:    Additional Comments:  Leone Haven, RN 12/19/2017, 8:31 AM

## 2018-01-23 ENCOUNTER — Institutional Professional Consult (permissible substitution): Payer: Medicare HMO | Admitting: Internal Medicine

## 2018-04-24 ENCOUNTER — Ambulatory Visit: Payer: Medicare HMO | Admitting: Pulmonary Disease

## 2018-05-17 ENCOUNTER — Ambulatory Visit: Payer: Medicare HMO | Admitting: Pulmonary Disease

## 2018-05-17 ENCOUNTER — Encounter: Payer: Self-pay | Admitting: Pulmonary Disease

## 2018-05-17 VITALS — BP 138/72 | HR 75 | Ht 65.0 in | Wt 229.0 lb

## 2018-05-17 DIAGNOSIS — G4733 Obstructive sleep apnea (adult) (pediatric): Secondary | ICD-10-CM

## 2018-05-17 DIAGNOSIS — E669 Obesity, unspecified: Secondary | ICD-10-CM

## 2018-05-17 DIAGNOSIS — F458 Other somatoform disorders: Secondary | ICD-10-CM

## 2018-05-17 DIAGNOSIS — G4730 Sleep apnea, unspecified: Secondary | ICD-10-CM | POA: Diagnosis not present

## 2018-05-17 DIAGNOSIS — Z9989 Dependence on other enabling machines and devices: Secondary | ICD-10-CM

## 2018-05-17 NOTE — Progress Notes (Signed)
[facility]  Chief Complaint  Patient presents with  . Follow-up    Pt is doing well overall with cpap machine.    Constitutional:  BP 138/72 (BP Location: Left Arm, Cuff Size: Normal)   Pulse 75   Ht 5\' 5"  (1.651 m)   Wt 229 lb (103.9 kg)   SpO2 98%   BMI 38.11 kg/m   Past Medical History:  DM, HTN, Blind, Hypothyroidism, HLD  Brief Summary:  Scott Bass is a 68 y.o. male with obstructive sleep apnea.  He has been doing well with CPAP.  Has full face mask.  No issues with mask fit.  Denies sinus congestion, sore throat, or dry mouth.  He does get bloating and belching in the morning.  Physical Exam:   Appearance - legally blind  ENMT - clear nasal mucosa, midline nasal  septum, no oral exudates, no LAN, trachea midline  Respiratory - normal chest wall, normal respiratory effort, no accessory muscle use, no wheeze/rales  CV - s1s2 regular rate and rhythm, no murmurs, no peripheral edema, radial pulses symmetric  GI - soft, non tender, no masses  Lymph - no adenopathy noted in neck and axillary areas  MSK - normal gait  Ext - no cyanosis, clubbing, or joint inflammation noted  Skin - no rashes, lesions, or ulcers  Neuro - normal strength, oriented x 3  Psych - normal mood and affect   Assessment/Plan:   Obstructive sleep apnea. - he is having aerophagia - will change to auto CPAP 5 - 12 cm H2O - he is compliant with CPAP and reports benefit  Obesity. - discussed importance of weight loss   Patient Instructions  Will change your auto CPAP to 5-12 cm H2O  Follow up in 1 year    Coralyn Helling, MD [facility] Pager: [phone] 05/17/2018, 10:19 AM  Flow Sheet    Sleep tests:  HST 07/24/16 >> AHI 32.2, SaO2 low 75% Auto 04/16/18 to 05/15/18 >> used on 30 of 30 nights with average 7 hrs 19 min.  Average AHI 0.6 with median CPAP 11 and 95 th percentile CPAP 14 cm H2O  Cardiac tests:    LHC 12/18/17 >> EF 49%, no significant CAD, frequent PVCs  Medications:   Allergies as of 05/17/2018      Reactions   Hctz [hydrochlorothiazide] Other (See Comments)   Pancreatitis       Medication List       Accurate as of May 17, 2018 10:19 AM. Always use your most recent med list.        aspirin 81 MG tablet Take 81 mg by mouth daily.   brimonidine 0.2 % ophthalmic solution Commonly known as:  ALPHAGAN Place 1 drop into both eyes 2 (two) times daily.   latanoprost 0.005 % ophthalmic solution Commonly known as:  XALATAN Place 1 drop into both eyes at bedtime.   levothyroxine 150 MCG tablet Commonly known as:  SYNTHROID, LEVOTHROID Take 150 mcg by mouth daily.   losartan 100 MG tablet Commonly known as:  COZAAR Take 1 tablet (100 mg total) by mouth daily.   metFORMIN 1000 MG tablet Commonly known as:  GLUCOPHAGE Take 1,000 mg by mouth daily with breakfast.   metoprolol tartrate 100 MG tablet Commonly known as:  LOPRESSOR Take 50-100 mg by mouth See admin instructions. Takes 100 mg in the morning, and 50 mg in the evening   Rosuvastatin Calcium 20 MG Cpsp Take 10 mg  by mouth daily.   spironolactone 12.5 mg Tabs tablet Commonly known as:  ALDACTONE Take 12.5 mg by mouth daily.       Past Surgical History:  He  has a past surgical history that includes Nose surgery and LEFT HEART CATH AND CORONARY ANGIOGRAPHY (N/A, 12/18/2017).  Family History:  His family history includes Diabetes Mellitus II in his sister.  Social History:  He  reports that he has never smoked. He has never used smokeless tobacco. He reports current alcohol use. He reports that he does not use drugs.

## 2018-05-17 NOTE — Patient Instructions (Signed)
Will change your auto CPAP to 5-12 cm H2O  Follow up in 1 year

## 2018-07-15 ENCOUNTER — Encounter: Payer: Self-pay | Admitting: Cardiology

## 2018-07-15 ENCOUNTER — Ambulatory Visit: Payer: Medicare HMO | Admitting: Cardiology

## 2018-07-15 VITALS — BP 161/74 | HR 48 | Ht 66.0 in | Wt 227.4 lb

## 2018-07-15 DIAGNOSIS — I428 Other cardiomyopathies: Secondary | ICD-10-CM | POA: Insufficient documentation

## 2018-07-15 DIAGNOSIS — I739 Peripheral vascular disease, unspecified: Secondary | ICD-10-CM | POA: Diagnosis not present

## 2018-07-15 DIAGNOSIS — I493 Ventricular premature depolarization: Secondary | ICD-10-CM

## 2018-07-15 DIAGNOSIS — I10 Essential (primary) hypertension: Secondary | ICD-10-CM | POA: Diagnosis not present

## 2018-07-15 DIAGNOSIS — J8410 Pulmonary fibrosis, unspecified: Secondary | ICD-10-CM | POA: Insufficient documentation

## 2018-07-15 MED ORDER — HYDRALAZINE HCL 25 MG PO TABS: 25.0000 mg | ORAL_TABLET | Freq: Three times a day (TID) | ORAL | 2 refills | Status: DC

## 2018-07-15 NOTE — Progress Notes (Signed)
Subjective:   Scott Bass, male    DOB: [birth year], 68 y.o.   MRN: [id number]  Tracey Harries, MD:  Chief Complaint  Patient presents with  . Hypertension  . Follow-up    HPI: Scott Bass  is a 68 y.o. male  With  hypertension, hyperlipidemia, OSA on CPAP, and type 2 diabetes. 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 Underwent echocardiogram on 11/20/2017 revealing mild decrease in LVEF at 49%, otherwise no significant changes compared to 2012. 24 hour Holter monitor revealed PVC burden of 16%. Lexiscan nuclear stress test was considered high risk; therefore, underwent coronary angiogram on 12/18/2017 revealing normal coronary arteries and mildly reduced LVEF.  Patient was last seen 8 weeks ago and due to worsening claudication symptoms, lower extremity arterial duplex was ordered; however, was not performed as he is concerned about cost. EP referral was also placed for continued frequent PVC's; however, he has not done this either.   Tolerating medications well. Symptoms of chest pain have improved, his main complaint is pain in his legs with walking that he describes as heaviness. Leg swelling has improved. Dyspnea on exertion is stable.   Continues to work for [employer] in [employer] and [employer] and [employer] supplies. No family history of heart disease.   Past Medical History:  Diagnosis Date  . Arthritis    "lower back" (12/18/2017)  . GERD (gastroesophageal reflux disease)   . Glaucoma, both eyes   . High cholesterol   . Hypertension   . Hypothyroidism   . Irregular heartbeat   . Legally blind    "both eyes; can see some out of left eye"  . OSA (obstructive sleep apnea)   . Thyroid disease   . Type II diabetes mellitus (HCC)   . Vitamin B12 deficiency     Past Surgical History:  Procedure Laterality Date  . LEFT HEART CATH AND CORONARY ANGIOGRAPHY N/A 12/18/2017   Procedure: LEFT HEART CATH  AND CORONARY ANGIOGRAPHY;  Surgeon: Elder Negus, MD;  Location: [facility];  Service: Cardiovascular;  Laterality: N/A;  . NOSE SURGERY     "was crooked; they straightened it out" (12/18/2017)    Family History  Problem Relation Age of Onset  . Diabetes Mellitus II Sister   . Breast cancer Sister     Social History   Socioeconomic History  . Marital status: Married    Spouse name: Not on file  . Number of children: 0  . Years of education: Not on file  . Highest education level: Not on file  Occupational History  . Occupation: retired  Engineer, production  . Financial resource strain: Not on file  . Food insecurity:    Worry: Not on file    Inability: Not on file  . Transportation needs:    Medical: Not on file    Non-medical: Not on file  Tobacco Use  . Smoking status: Never Smoker  . Smokeless tobacco: Never Used  Substance and Sexual Activity  . Alcohol use: Yes    Comment: 12/18/2017 "might have a 6 pack of beer/year; if that"  . Drug use: Never  . Sexual activity: Not Currently  Lifestyle  . Physical activity:    Days per week: Not on file    Minutes per session: Not on file  . Stress: Not on file  Relationships  . Social connections:    Talks on phone: Not  on file    Gets together: Not on file    Attends religious service: Not on file    Active member of club or organization: Not on file    Attends meetings of clubs or organizations: Not on file    Relationship status: Not on file  . Intimate partner violence:    Fear of current or ex partner: Not on file    Emotionally abused: Not on file    Physically abused: Not on file    Forced sexual activity: Not on file  Other Topics Concern  . Not on file  Social History Narrative  . Not on file    Current Meds  Medication Sig  . aspirin 81 MG tablet Take 81 mg by mouth daily.  . brimonidine (ALPHAGAN) 0.2 % ophthalmic solution Place 1 drop into both eyes 2 (two) times daily.   Marland Kitchen latanoprost  (XALATAN) 0.005 % ophthalmic solution Place 1 drop into both eyes at bedtime.  Marland Kitchen levothyroxine (SYNTHROID, LEVOTHROID) 150 MCG tablet Take 150 mcg by mouth daily.  Marland Kitchen losartan (COZAAR) 100 MG tablet Take 1 tablet (100 mg total) by mouth daily.  . metFORMIN (GLUCOPHAGE) 1000 MG tablet Take 1,500 mg by mouth daily with breakfast.   . metoprolol tartrate (LOPRESSOR) 100 MG tablet Take 75 mg by mouth 2 (two) times daily.   . Rosuvastatin Calcium 20 MG CPSP Take 10 mg by mouth daily.   Marland Kitchen spironolactone (ALDACTONE) 12.5 mg TABS tablet Take 12.5 mg by mouth daily.     Review of Systems  Constitution: Negative for decreased appetite, malaise/fatigue, weight gain and weight loss.  Eyes: Negative for visual disturbance.  Cardiovascular: Positive for claudication, dyspnea on exertion and leg swelling. Negative for chest pain, orthopnea, palpitations and syncope.  Respiratory: Negative for hemoptysis and wheezing.   Endocrine: Negative for cold intolerance and heat intolerance.  Hematologic/Lymphatic: Does not bruise/bleed easily.  Skin: Negative for nail changes.  Musculoskeletal: Negative for muscle weakness and myalgias.  Gastrointestinal: Negative for abdominal pain, change in bowel habit, nausea and vomiting.  Neurological: Negative for difficulty with concentration, dizziness, focal weakness and headaches.  Psychiatric/Behavioral: Negative for altered mental status and suicidal ideas.  All other systems reviewed and are negative.      Objective:     Blood pressure (!) 161/74, pulse (!) 48, height 5\' 6"  (1.676 m), weight 227 lb 6.4 oz (103.1 kg), SpO2 100 %.  Cardiac studies:  EKG 07/15/2018: Normal sinus rhythm at 76 bpm, left atrial enlargement, normal axis, LVH. No evidence of ischemia.   Lexiscan myoview stress test 11/30/2017: 1.The resting electrocardiogram demonstrated normal sinus rhythm, normal resting conduction and frequent PVC in bigeminal pattern. Stress EKG is non diagnostic  due to pharmacologic stress with Lexiscan. PVC persisted. Stress symptoms included chest pressure. 2. Left ventricular cavity is noted to be enlarged on the rest and stress studies at 172 mL. This is an abnormal myocardial perfusion imaging study demonstrating a mixture of scar plus ischemia in the basal inferior, mid inferior and apical inferior myocardial wall(s). Gated SPECT imaging demonstrates hypokinesis of the basal inferior, mid inferior and apical inferior myocardial wall(s). The left ventricular ejection fraction was calculated to be 41%. 3. This is an intermediate risk study, clinical correlation recommended. Holter Monitor 24 hours 11/05/2017: Total PVC burden 16%. Total 17,000 PVC. 178 couplets, 13,000 V- Bigeminy. Rare PAC. No symptoms reported.  Echocardiogram 11/20/2017: Frequent PVC's seen. Left ventricle cavity is normal in size. Mild concentric hypertrophy of the left  ventricle. Mild decrease in global wall motion. Normal diastolic filling pattern. Calculated EF 49%. Left atrial cavity is mildly dilated. Mild (Grade I) mitral regurgitation. Inadequate tricuspid regurgitation jet to estimate pulmonary artery pressure. IVC is dilated with blunted respiratory response. This may suggest elevated right heart pressure. Compared to previous study on 02/15/2011, LVEF is mildly reduced.  Cath 12/18/17: Codominant circulation with no significant coronary artery disease. Mildly elevated LVEDP.  Frequent symptomatic PVC's of nonischemic etiology. Consider EP referral for further management given PVC burden 16% on high dose beta blocker (metoprolol tartarate 100-50 mg bid) and mildly reduced LVEF 49%  Physical Exam  Constitutional: He is oriented to person, place, and time. Vital signs are normal. He appears well-developed and well-nourished.  HENT:  Head: Normocephalic and atraumatic.  Neck: Normal range of motion.  Cardiovascular: Normal rate, regular rhythm, normal heart sounds and intact distal  pulses. Frequent extrasystoles are present.  Pulmonary/Chest: Effort normal and breath sounds normal. No accessory muscle usage. No respiratory distress.  Abdominal: Soft. Bowel sounds are normal.  Musculoskeletal: Normal range of motion.  Neurological: He is alert and oriented to person, place, and time.  Skin: Skin is warm and dry.  Vitals reviewed.       Assessment & Recommendations:   1. Frequent PVCs Improved on EKG today. Options include antiarrhythmic therapy vs. PVC ablation; however, patient does not wish to have either done at this time. Will continue with aggressive blood pressure control and high dose beta blocker. Could also consider addition of diltiazem if needed.   2. Nonischemic cardiomyopathy (HCC) Likely related to frequent PVC's.  On losartan. He would benefit from Bidil. Symptoms have been stable.   3. Essential hypertension Continues to be uncontrolled, will start him on hydralazine 25mg  daily.   4. Claudication (HCC) Does not want to have lower extremity duplex due to concern on cost; although, I have discussed that insurance will likely cover this. He also has uncontrolled diabetes. Has normal pulses. Question if related to possible diabetic neuropathy or neurogenic claudication. Encouraged to discuss with is PCP. Will continue with close monitoring.   Plan: I will see him back in 6-8 weeks for follow up on PVC's and HTN.    Altamese Clare, MSN, APRN, FNP-C [facility] Office: [phone] Fax: [phone]

## 2018-07-17 NOTE — Progress Notes (Signed)
Yours

## 2018-09-10 ENCOUNTER — Ambulatory Visit: Payer: Medicare HMO | Admitting: Cardiology

## 2018-09-23 ENCOUNTER — Other Ambulatory Visit: Payer: Self-pay | Admitting: Cardiology

## 2018-09-25 ENCOUNTER — Other Ambulatory Visit: Payer: Self-pay | Admitting: Cardiology

## 2018-09-25 NOTE — Telephone Encounter (Signed)
Please fill

## 2018-10-07 ENCOUNTER — Ambulatory Visit: Payer: Medicare HMO | Admitting: Cardiology

## 2018-10-07 ENCOUNTER — Telehealth: Payer: Self-pay

## 2018-10-07 ENCOUNTER — Encounter: Payer: Self-pay | Admitting: Cardiology

## 2018-10-07 ENCOUNTER — Other Ambulatory Visit: Payer: Self-pay

## 2018-10-07 VITALS — BP 144/83 | HR 50 | Ht 67.0 in | Wt 224.0 lb

## 2018-10-07 DIAGNOSIS — I10 Essential (primary) hypertension: Secondary | ICD-10-CM

## 2018-10-07 DIAGNOSIS — I493 Ventricular premature depolarization: Secondary | ICD-10-CM | POA: Diagnosis not present

## 2018-10-07 DIAGNOSIS — I739 Peripheral vascular disease, unspecified: Secondary | ICD-10-CM | POA: Diagnosis not present

## 2018-10-07 DIAGNOSIS — I428 Other cardiomyopathies: Secondary | ICD-10-CM

## 2018-10-07 NOTE — Progress Notes (Signed)
Primary Physician:  Tracey Harries, MD   Patient ID: Scott Bass, male    DOB: April 11, 1951, 68 y.o.   MRN: [id number]  Subjective:    Chief Complaint  Patient presents with  . Hypertension  . Cardiomyopathy  . Follow-up    HPI: Scott Bass  is a 68 y.o. male  with hypertension, hyperlipidemia, OSA on CPAP, and type 2 diabetes. 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 Underwent echocardiogram on 11/20/2017 revealing mild decrease in LVEF at 49%, otherwise no significant changes compared to 2012. 24 hour Holter monitor revealed PVC burden of 16%. Lexiscan nuclear stress test was considered high risk; therefore, underwent coronary angiogram on 12/18/2017 revealing normal coronary arteries and mildly reduced LVEF.  Patient was last seen 2 months ago and due to worsening claudication symptoms, lower extremity arterial duplex was ordered; however, was not performed as he is concerned about cost. I had also previously referred to EP for frequent PVC's; however, patient did not wish to see them or to also try antiarrhythmic therapy. Aggressive medical management was recommended.   Tolerating medications well. Symptoms of claudication have improved and seems to attribute this to increased hydration; however, he has also been off of work recently in view of COVID restrictions. He denies any chest pain. Has chronic dyspnea on exertion that is unchanged and minimal leg edema. States that blood pressure has improved by recent check at PCP office.  Continues to work for [employer] in [employer] and [employer] supplies. No family history of heart disease.  Past Medical History:  Diagnosis Date  . Arthritis    "lower back" (12/18/2017)  . GERD (gastroesophageal reflux disease)   . Glaucoma, both eyes   . High cholesterol   . Hypertension   . Hypothyroidism   . Irregular heartbeat   . Legally blind    "both eyes;  can see some out of left eye"  . OSA (obstructive sleep apnea)   . Thyroid disease   . Type II diabetes mellitus (HCC)   . Vitamin B12 deficiency     Past Surgical History:  Procedure Laterality Date  . LEFT HEART CATH AND CORONARY ANGIOGRAPHY N/A 12/18/2017   Procedure: LEFT HEART CATH AND CORONARY ANGIOGRAPHY;  Surgeon: Elder Negus, MD;  Location: [facility];  Service: Cardiovascular;  Laterality: N/A;  . NOSE SURGERY     "was crooked; they straightened it out" (12/18/2017)    Social History   Socioeconomic History  . Marital status: Married    Spouse name: Not on file  . Number of children: 0  . Years of education: Not on file  . Highest education level: Not on file  Occupational History  . Occupation: retired  Engineer, production  . Financial resource strain: Not on file  . Food insecurity:    Worry: Not on file    Inability: Not on file  . Transportation needs:    Medical: Not on file    Non-medical: Not on file  Tobacco Use  . Smoking status: Never Smoker  . Smokeless tobacco: Never Used  Substance and Sexual Activity  . Alcohol use: Yes    Comment: occ  . Drug use: Never  . Sexual activity: Not Currently  Lifestyle  . Physical activity:    Days per week: Not on file    Minutes per session: Not on file  . Stress: Not on file  Relationships  .  Social connections:    Talks on phone: Not on file    Gets together: Not on file    Attends religious service: Not on file    Active member of club or organization: Not on file    Attends meetings of clubs or organizations: Not on file    Relationship status: Not on file  . Intimate partner violence:    Fear of current or ex partner: Not on file    Emotionally abused: Not on file    Physically abused: Not on file    Forced sexual activity: Not on file  Other Topics Concern  . Not on file  Social History Narrative  . Not on file    Review of Systems  Constitution: Negative for decreased appetite,  malaise/fatigue, weight gain and weight loss.  Eyes: Negative for visual disturbance.  Cardiovascular: Positive for claudication (improved), dyspnea on exertion and leg swelling. Negative for chest pain, orthopnea, palpitations and syncope.  Respiratory: Negative for hemoptysis and wheezing.   Endocrine: Negative for cold intolerance and heat intolerance.  Hematologic/Lymphatic: Does not bruise/bleed easily.  Skin: Negative for nail changes.  Musculoskeletal: Negative for muscle weakness and myalgias.  Gastrointestinal: Negative for abdominal pain, change in bowel habit, nausea and vomiting.  Neurological: Negative for difficulty with concentration, dizziness, focal weakness and headaches.  Psychiatric/Behavioral: Negative for altered mental status and suicidal ideas.  All other systems reviewed and are negative.     Objective:  Blood pressure (!) 144/83, pulse (!) 50, height  (1.702 m), weight 224 lb (101.6 kg), SpO2 97 %. Body mass index is 35.08 kg/m.    Physical Exam  Constitutional: He is oriented to person, place, and time. Vital signs are normal. He appears well-developed and well-nourished.  Moderately obese  HENT:  Head: Normocephalic and atraumatic.  Neck: Normal range of motion.  Cardiovascular: Normal rate, regular rhythm, normal heart sounds and intact distal pulses. Frequent extrasystoles are present.  1+ bilateral pitting edema  Pulmonary/Chest: Effort normal and breath sounds normal. No accessory muscle usage. No respiratory distress.  Abdominal: Soft. Bowel sounds are normal.  Musculoskeletal: Normal range of motion.  Neurological: He is alert and oriented to person, place, and time.  Skin: Skin is warm and dry.  Vitals reviewed.  Radiology: No results found.  Laboratory examination:    CMP Latest Ref Rng & Units 11/22/2015 05/19/2015 02/18/2015  Glucose 65 - 99 mg/dL 161(W) 960(A) 540(J)  BUN 7 - 25 mg/dL Creatinine 0.70 - 1.25 mg/dL 8.11 9.14  7.82  Sodium 135 - 146 mmol/L 140 141 140  Potassium 3.5 - 5.3 mmol/L 4.2 4.1 3.6  Chloride 98 - 110 mmol/L 106 108 104  CO2 20 - 31 mmol/L Calcium 8.6 - 10.3 mg/dL 9.0 9.0 9.5(A)  Total Protein 6.1 - 8.1 g/dL 7.6 6.5 6.8  Total Bilirubin 0.2 - 1.2 mg/dL 0.4 0.3 1.1  Alkaline Phos 40 - 115 U/L 70 54 49  AST 10 - 35 U/L ALT 9 - 46 U/L 13 19 15(L)   CBC Latest Ref Rng & Units 11/22/2015 05/19/2015 02/18/2015  WBC 4.6 - 10.2 K/uL 8.4 8.1 10.3  Hemoglobin 14.1 - 18.1 g/dL 10.9(A) 11.2(L) 11.3(L)  Hematocrit 43.5 - 53.7 % 32.3(A) 34.6(L) 33.9(L)  Platelets 150 - 400 K/uL - 253 246   Lipid Panel     Component Value Date/Time   CHOL 171 02/17/2015 0816   TRIG 65 02/17/2015 0816  HDL 49 02/17/2015 0816   CHOLHDL 3.5 02/17/2015 0816   VLDL 13 02/17/2015 0816   LDLCALC 109 (H) 02/17/2015 0816   HEMOGLOBIN A1C Lab Results  Component Value Date   HGBA1C 5.9 09/05/2006   TSH No results for input(s): TSH in the last 8760 hours.  PRN Meds:. There are no discontinued medications. Current Meds  Medication Sig  . aspirin 81 MG tablet Take 81 mg by mouth daily.  . brimonidine (ALPHAGAN) 0.2 % ophthalmic solution Place 1 drop into both eyes 2 (two) times daily.   . hydrALAZINE (APRESOLINE) 25 MG tablet Take 1 tablet (25 mg total) by mouth 3 (three) times daily.  Marland Kitchen latanoprost (XALATAN) 0.005 % ophthalmic solution Place 1 drop into both eyes at bedtime.  Marland Kitchen levothyroxine (SYNTHROID, LEVOTHROID) 150 MCG tablet Take 150 mcg by mouth daily.  Marland Kitchen losartan (COZAAR) 100 MG tablet Take 1 tablet (100 mg total) by mouth daily.  . metFORMIN (GLUCOPHAGE) 1000 MG tablet Take 1,000 mg by mouth 2 (two) times a day.   . metoprolol tartrate (LOPRESSOR) 100 MG tablet Take 75 mg by mouth 2 (two) times daily.   . Rosuvastatin Calcium 20 MG CPSP Take 20 mg by mouth daily.   Marland Kitchen spironolactone (ALDACTONE) 25 MG tablet TAKE 1 TABLET BY MOUTH DAILY IN THE MORNING    Cardiac Studies:    Lexiscan myoview stress test 11/30/2017: 1.The resting electrocardiogram demonstrated normal sinus rhythm, normal resting conduction and frequent PVC in bigeminal pattern. Stress EKG is non diagnostic due to pharmacologic stress with Lexiscan. PVC persisted. Stress symptoms included chest pressure. 2. Left ventricular cavity is noted to be enlarged on the rest and stress studies at 172 mL. This is an abnormal myocardial perfusion imaging study demonstrating a mixture of scar plus ischemia in the basal inferior, mid inferior and apical inferior myocardial wall(s). Gated SPECT imaging demonstrates hypokinesis of the basal inferior, mid inferior and apical inferior myocardial wall(s). The left ventricular ejection fraction was calculated to be 41%. 3. This is an intermediate risk study, clinical correlation recommended.   Holter Monitor 24 hours 11/05/2017: Total PVC burden 16%. Total 17,000 PVC. 178 couplets, 13,000 V- Bigeminy. Rare PAC. No symptoms reported.  Echocardiogram 11/20/2017: Frequent PVC's seen. Left ventricle cavity is normal in size. Mild concentric hypertrophy of the left ventricle. Mild decrease in global wall motion. Normal diastolic filling pattern. Calculated EF 49%. Left atrial cavity is mildly dilated. Mild (Grade I) mitral regurgitation. Inadequate tricuspid regurgitation jet to estimate pulmonary artery pressure. IVC is dilated with blunted respiratory response. This may suggest elevated right heart pressure. Compared to previous study on 02/15/2011, LVEF is mildly reduced.  Cath 12/18/17: Codominant circulation with no significant coronary artery disease. Mildly elevated LVEDP.  Frequent symptomatic PVC's of nonischemic etiology. Consider EP referral for further management given PVC burden 16% on high dose beta blocker (metoprolol tartarate 100-50 mg bid) and mildly reduced LVEF 49%  Assessment:   Frequent PVCs - Plan: Ambulatory referral to Cardiac Electrophysiology   Nonischemic cardiomyopathy [facility])  Essential hypertension  Claudication (HCC)  EKG 07/15/2018: Normal sinus rhythm at 76 bpm, left atrial enlargement, normal axis, LVH. No evidence of ischemia.   Recommendations:   Despite aggressive medical management and better control of his blood pressure, he continues to have frequent PVC's. I had previously place referral to EP; however, patient did not go. I would like for patient to be seen by them for recommendations on antiarrhythmic therapy vs. PVC ablation vs. Monitoring. He does have  mildly depressed LVEF with normal coronary angiogram that is likely attributed by his frequent PVC's. Had slightly elevated PVC burden on previous holter monitor. I have discussed this with the patient, and he is now willing to be evaluated by them. Will again place referral.  Hypertension has improved, will continue with current medications for now. No evidence of acute decompensated heart failure. Previous complaints of claudication have improved recently. Potentially also related to diabetic neuropathy. Will hold off on lower extremity duplex for now. No evidence of ischemic limb. I will see him back in 3 months for follow up.   Toniann Fail, MSN, APRN, FNP-C [facility] [place] Office: [phone] Fax: [phone]

## 2018-10-13 ENCOUNTER — Other Ambulatory Visit: Payer: Self-pay | Admitting: Cardiology

## 2018-10-13 DIAGNOSIS — I10 Essential (primary) hypertension: Secondary | ICD-10-CM

## 2018-10-14 ENCOUNTER — Telehealth: Payer: Self-pay | Admitting: *Deleted

## 2018-10-14 NOTE — Telephone Encounter (Signed)
Calling patient today to discuss upcoming appointment.  We are currently trying to limit exposure to the virus that causes COVID-19 by seeing patients at home rather than in the office. We would like to schedule this appointment as a Virtual Appointment VIA Smartphone or Laptop. Unable to reach patient.  LVMTCB  

## 2018-10-15 NOTE — Telephone Encounter (Signed)

## 2018-10-17 ENCOUNTER — Encounter: Payer: Self-pay | Admitting: Cardiology

## 2018-10-17 ENCOUNTER — Telehealth (INDEPENDENT_AMBULATORY_CARE_PROVIDER_SITE_OTHER): Payer: Medicare HMO | Admitting: Cardiology

## 2018-10-17 ENCOUNTER — Other Ambulatory Visit: Payer: Self-pay

## 2018-10-17 DIAGNOSIS — I493 Ventricular premature depolarization: Secondary | ICD-10-CM | POA: Diagnosis not present

## 2018-10-17 NOTE — Progress Notes (Signed)
Virtual Visit via Telephone Note   This visit type was conducted due to national recommendations for restrictions regarding the COVID-19 Pandemic (e.g. social distancing) in an effort to limit this patient's exposure and mitigate transmission in our community.  Due to his co-morbid illnesses, this patient is at least at moderate risk for complications without adequate follow up.  This format is felt to be most appropriate for this patient at this time.  The patient did not have access to video technology/had technical difficulties with video requiring transitioning to audio format only (telephone).  All issues noted in this document were discussed and addressed.  No physical exam could be performed with this format.  Please refer to the patient's chart for his  consent to telehealth for [facility].   Date:  10/17/2018   ID:  Scott Bass, DOB 17-Nov-1950, MRN [id number]  Patient Location: Home Provider Location: Home  PCP:  Tracey Harries, MD  Cardiologist:  No primary care provider on file.  Electrophysiologist:  None   Evaluation Performed:  Consultation - Scott Bass was referred by Donnelly Stager for the evaluation of PVCs.  Chief Complaint:  PVCs  History of Present Illness:    Scott Bass is a 68 y.o. male with he has a history significant for hypertension, hyperlipidemia, OSA on CPAP, and type 2 diabetes.  He has a vision issue from his diabetes.  He underwent echo 11/20/2017 that showed an ejection fraction of 49%.  He wore a 24-hour Holter monitor that showed a PVC burden of 16%.  Coronary angiography showed normal coronary arteries.  He was having claudication issues but did not receive an arterial duplex due to his concern over cost.  Today, denies symptoms of chest pain, shortness of breath, orthopnea, PND, lower extremity edema, claudication, dizziness, presyncope, syncope, bleeding, or neurologic sequela. The patient is tolerating medications without difficulties.  He  is continued to have palpitations despite his current medications.  The patient does not have symptoms concerning for COVID-19 infection (fever, chills, cough, or new shortness of breath).    Past Medical History:  Diagnosis Date  . Arthritis    "lower back" (12/18/2017)  . GERD (gastroesophageal reflux disease)   . Glaucoma, both eyes   . High cholesterol   . Hypertension   . Hypothyroidism   . Irregular heartbeat   . Legally blind    "both eyes; can see some out of left eye"  . OSA (obstructive sleep apnea)   . Thyroid disease   . Type II diabetes mellitus (HCC)   . Vitamin B12 deficiency    Past Surgical History:  Procedure Laterality Date  . LEFT HEART CATH AND CORONARY ANGIOGRAPHY N/A 12/18/2017   Procedure: LEFT HEART CATH AND CORONARY ANGIOGRAPHY;  Surgeon: Elder Negus, MD;  Location: [facility];  Service: Cardiovascular;  Laterality: N/A;  . NOSE SURGERY     "was crooked; they straightened it out" (12/18/2017)     Current Meds  Medication Sig  . aspirin 81 MG tablet Take 81 mg by mouth daily.  . brimonidine (ALPHAGAN) 0.2 % ophthalmic solution Place 1 drop into both eyes 2 (two) times daily.   . hydrALAZINE (APRESOLINE) 25 MG tablet TAKE 1 TABLET(25 MG) BY MOUTH THREE TIMES DAILY  . latanoprost (XALATAN) 0.005 % ophthalmic solution Place 1 drop into both eyes at bedtime.  Marland Kitchen levothyroxine (SYNTHROID, LEVOTHROID) 150 MCG tablet Take 150 mcg by mouth daily.  Marland Kitchen losartan (COZAAR) 100 MG tablet Take  1 tablet (100 mg total) by mouth daily.  . metFORMIN (GLUCOPHAGE) 1000 MG tablet Take 1,000 mg by mouth 2 (two) times a day.   . metoprolol tartrate (LOPRESSOR) 100 MG tablet Take 75 mg by mouth 2 (two) times daily.   . Rosuvastatin Calcium 20 MG CPSP Take 20 mg by mouth daily.   Marland Kitchen spironolactone (ALDACTONE) 25 MG tablet TAKE 1 TABLET BY MOUTH DAILY IN THE MORNING     Allergies:   Hctz [hydrochlorothiazide]   Social History   Tobacco Use  . Smoking status:  Never Smoker  . Smokeless tobacco: Never Used  Substance Use Topics  . Alcohol use: Yes    Comment: occ  . Drug use: Never     Family Hx: The patient's family history includes Breast cancer in his sister; Diabetes Mellitus II in his sister.  ROS:   Please see the history of present illness.     All other systems reviewed and are negative.   Prior CV studies:   The following studies were reviewed today:  LHC 12/18/17 Codominant circulation with no significant coronary artery disease. Mildly elevated LVEDP.  Labs/Other Tests and Data Reviewed:    EKG:  An ECG dated 07/15/2018 was personally reviewed today and demonstrated:  Sinus rhythm  Recent Labs: No results found for requested labs within last 8760 hours.   Recent Lipid Panel Lab Results  Component Value Date/Time   CHOL 171 02/17/2015 08:16 AM   TRIG 65 02/17/2015 08:16 AM   HDL 49 02/17/2015 08:16 AM   CHOLHDL 3.5 02/17/2015 08:16 AM   LDLCALC 109 (H) 02/17/2015 08:16 AM    Wt Readings from Last 3 Encounters:  10/07/18 224 lb (101.6 kg)  07/15/18 227 lb 6.4 oz (103.1 kg)  03/12/18 228 lb 5 oz (103.6 kg)     Objective:    Vital Signs:  There were no vitals taken for this visit.   GEN:  no acute distress PSYCH:  normal affect  ASSESSMENT & PLAN:    1. PVCs: Cardiac monitor shows a burden of 16% per report.  Unfortunately, I do not have access to this report.  Ronita Hargreaves work to get ECGs that showed PVCs as well as the Holter monitor report from Dr. Glennis Brink office.  The patient would be amenable to either medications or ablation. 2. Nonischemic cardiomyopathy: Normal coronary arteries.  Could be a PVC induced cardiomyopathy based on his elevated burden.  Maleka Contino potentially improve with therapy of PVCs.  COVID-19 Education: The signs and symptoms of COVID-19 were discussed with the patient and how to seek care for testing (follow up with PCP or arrange E-visit).  The importance of social distancing was discussed  today.  Time:   Today, I have spent 11 minutes with the patient with telehealth technology discussing the above problems.     Medication Adjustments/Labs and Tests Ordered: Current medicines are reviewed at length with the patient today.  Concerns regarding medicines are outlined above.   Tests Ordered: No orders of the defined types were placed in this encounter.   Medication Changes: No orders of the defined types were placed in this encounter.   Disposition:  Follow up Upon receipt of outside records  Signed, Siegfried Vieth Meredith Leeds, MD  10/17/2018 11:37 AM    [place]

## 2018-10-28 ENCOUNTER — Telehealth: Payer: Self-pay | Admitting: Cardiology

## 2018-10-28 NOTE — Telephone Encounter (Signed)
-----   Message from Adrian Prows, MD sent at 10/28/2018  4:43 AM EDT ----- Regarding: RE: EP referral I would not send the referral. I would probably repeat echo and Holter again ----- Message ----- From: Miquel Dunn, NP Sent: 10/23/2018   6:30 PM EDT To: Adrian Prows, MD Subject: RE: EP referral                                I have already had [facility] send them to him. Patient kept canceling his appt and just now went to see them ----- Message ----- From: Adrian Prows, MD Sent: 10/23/2018   6:27 PM EDT To: Constance Haw, MD, # Subject: EP referral                                    The studies are very old, I would repeat his echo and 24 hour Holter prior to considering EP eval. I will inform Dr. Melodye Ped who was requesting strips from Holter and EKG for PVC burden.   I can upload into Epic prior Holter from 2019, but probably more appropriate to repeat  [place]

## 2018-11-07 NOTE — Telephone Encounter (Signed)
Called pt left vm awaiting call back

## 2018-11-12 ENCOUNTER — Other Ambulatory Visit: Payer: Self-pay | Admitting: Cardiology

## 2018-11-12 DIAGNOSIS — I493 Ventricular premature depolarization: Secondary | ICD-10-CM

## 2018-11-12 DIAGNOSIS — I428 Other cardiomyopathies: Secondary | ICD-10-CM

## 2018-11-12 NOTE — Progress Notes (Signed)
Orders placed for echo and 48 hour holter monitor

## 2018-11-22 ENCOUNTER — Ambulatory Visit (INDEPENDENT_AMBULATORY_CARE_PROVIDER_SITE_OTHER): Payer: Medicare HMO

## 2018-11-22 ENCOUNTER — Ambulatory Visit: Payer: Medicare HMO

## 2018-11-22 ENCOUNTER — Other Ambulatory Visit: Payer: Self-pay

## 2018-11-22 DIAGNOSIS — I493 Ventricular premature depolarization: Secondary | ICD-10-CM

## 2018-11-22 DIAGNOSIS — I428 Other cardiomyopathies: Secondary | ICD-10-CM | POA: Diagnosis not present

## 2018-11-25 DIAGNOSIS — I493 Ventricular premature depolarization: Secondary | ICD-10-CM | POA: Diagnosis not present

## 2018-12-05 ENCOUNTER — Other Ambulatory Visit: Payer: Self-pay

## 2018-12-25 ENCOUNTER — Other Ambulatory Visit: Payer: Self-pay | Admitting: Cardiology

## 2019-01-08 ENCOUNTER — Ambulatory Visit: Payer: Medicare HMO | Admitting: Cardiology

## 2019-01-14 ENCOUNTER — Ambulatory Visit: Payer: Medicare HMO | Admitting: Cardiology

## 2019-01-20 ENCOUNTER — Ambulatory Visit: Payer: Medicare HMO | Admitting: Cardiology

## 2019-01-22 ENCOUNTER — Other Ambulatory Visit: Payer: Self-pay

## 2019-01-22 DIAGNOSIS — I10 Essential (primary) hypertension: Secondary | ICD-10-CM

## 2019-01-22 MED ORDER — HYDRALAZINE HCL 25 MG PO TABS: ORAL_TABLET | ORAL | 2 refills | Status: DC

## 2019-01-28 ENCOUNTER — Telehealth: Payer: Self-pay

## 2019-02-03 ENCOUNTER — Other Ambulatory Visit: Payer: Self-pay

## 2019-02-03 ENCOUNTER — Telehealth (INDEPENDENT_AMBULATORY_CARE_PROVIDER_SITE_OTHER): Payer: Medicare HMO | Admitting: Cardiology

## 2019-02-03 ENCOUNTER — Encounter: Payer: Self-pay | Admitting: Cardiology

## 2019-02-03 VITALS — Ht 67.0 in | Wt 225.0 lb

## 2019-02-03 DIAGNOSIS — I493 Ventricular premature depolarization: Secondary | ICD-10-CM | POA: Diagnosis not present

## 2019-02-03 DIAGNOSIS — I428 Other cardiomyopathies: Secondary | ICD-10-CM

## 2019-02-03 DIAGNOSIS — I739 Peripheral vascular disease, unspecified: Secondary | ICD-10-CM | POA: Diagnosis not present

## 2019-02-03 DIAGNOSIS — I10 Essential (primary) hypertension: Secondary | ICD-10-CM | POA: Diagnosis not present

## 2019-02-03 NOTE — Progress Notes (Signed)
Primary Physician:  Bernerd Limbo, MD   Patient ID: Scott Bass, male    DOB: [birth year], 68 y.o.   MRN: [id number]  Subjective:   This visit type was conducted due to national recommendations for restrictions regarding the COVID-19 Pandemic (e.g. social distancing).  This format is felt to be most appropriate for this patient at this time.  All issues noted in this document were discussed and addressed.  No physical exam was performed (except for noted visual exam findings with Telehealth visits).  The patient has consented to conduct a Telehealth visit and understands insurance will be billed.   I discussed the limitations of evaluation and management by telemedicine and the availability of in person appointments. The patient expressed understanding and agreed to proceed.  Virtual Visit via Video Note is as below  I connected with Scott Bass, on 02/04/19 at 1430 by telephone and verified that I am speaking with the correct person using two identifiers. Unable to perform video visit as patient did not have equipment.    I have discussed with the patient regarding the safety during COVID Pandemic and steps and precautions including social distancing with the patient.    Chief Complaint  Patient presents with  . Other    PVC's  . Results    HPI: Scott Bass  is a 68 y.o. male  with hypertension, hyperlipidemia, OSA on CPAP, and type 2 diabetes. 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 Underwent echocardiogram on 11/20/2017 revealing mild decrease in LVEF at 49%, otherwise no significant changes compared to 2012. 24 hour Holter monitor revealed PVC burden of 16%. Lexiscan nuclear stress test was considered high risk; therefore, underwent coronary angiogram on 12/18/2017 revealing normal coronary arteries and mildly reduced LVEF.  Patient was last seen 3 months ago and due to continued PVC's, was referred to EP. Repeating holter monitor and  echocardiogram was recommended, in which he recently had performed and is here to discuss results.  Patient reports that he is doing well. He denies any chest pain. Has chronic dyspnea on exertion that is unchanged and minimal leg edema. He does continue to report pain in his legs with walking. I had previously ordered LE arterial duplex, but this was not performed as patient was concerned about the cost.  He has not checked his blood pressure recently and did not have a way to check today. States that he is due for follow up with PCP.  Continues to work for [employer] in [employer] and [employer] supplies. No family history of heart disease.  Past Medical History:  Diagnosis Date  . Arthritis    "lower back" (12/18/2017)  . GERD (gastroesophageal reflux disease)   . Glaucoma, both eyes   . High cholesterol   . Hypertension   . Hypothyroidism   . Irregular heartbeat   . Legally blind    "both eyes; can see some out of left eye"  . OSA (obstructive sleep apnea)   . Thyroid disease   . Type II diabetes mellitus ([place])   . Vitamin B12 deficiency     Past Surgical History:  Procedure Laterality Date  . LEFT HEART CATH AND CORONARY ANGIOGRAPHY N/A 12/18/2017   Procedure: LEFT HEART CATH AND CORONARY ANGIOGRAPHY;  Surgeon: Nigel Mormon, MD;  Location: [facility];  Service: Cardiovascular;  Laterality: N/A;  . NOSE SURGERY     "was crooked; they straightened it  out" (12/18/2017)    Social History   Socioeconomic History  . Marital status: Married    Spouse name: Not on file  . Number of children: 0  . Years of education: Not on file  . Highest education level: Not on file  Occupational History  . Occupation: retired  Engineer, production  . Financial resource strain: Not on file  . Food insecurity    Worry: Not on file    Inability: Not on file  . Transportation needs    Medical: Not on file    Non-medical: Not on file  Tobacco  Use  . Smoking status: Never Smoker  . Smokeless tobacco: Never Used  Substance and Sexual Activity  . Alcohol use: Yes    Comment: occ  . Drug use: Never  . Sexual activity: Not Currently  Lifestyle  . Physical activity    Days per week: Not on file    Minutes per session: Not on file  . Stress: Not on file  Relationships  . Social Musician on phone: Not on file    Gets together: Not on file    Attends religious service: Not on file    Active member of club or organization: Not on file    Attends meetings of clubs or organizations: Not on file    Relationship status: Not on file  . Intimate partner violence    Fear of current or ex partner: Not on file    Emotionally abused: Not on file    Physically abused: Not on file    Forced sexual activity: Not on file  Other Topics Concern  . Not on file  Social History Narrative  . Not on file    Review of Systems  Constitution: Negative for decreased appetite, malaise/fatigue, weight gain and weight loss.  Eyes: Negative for visual disturbance.  Cardiovascular: Positive for chest pain (on occasion), claudication (improved), dyspnea on exertion and leg swelling. Negative for orthopnea, palpitations and syncope.  Respiratory: Negative for hemoptysis and wheezing.   Endocrine: Negative for cold intolerance and heat intolerance.  Hematologic/Lymphatic: Does not bruise/bleed easily.  Skin: Negative for nail changes.  Musculoskeletal: Negative for muscle weakness and myalgias.  Gastrointestinal: Negative for abdominal pain, change in bowel habit, nausea and vomiting.  Neurological: Negative for difficulty with concentration, dizziness, focal weakness and headaches.  Psychiatric/Behavioral: Negative for altered mental status and suicidal ideas.  All other systems reviewed and are negative.     Objective:  Height 5\' 7"  (1.702 m), weight 225 lb (102.1 kg). Body mass index is 35.24 kg/m.    Physical exam not performed or  limited due to virtual visit.   Please see exam details from prior visit is as below.   Physical Exam  Constitutional: He is oriented to person, place, and time. Vital signs are normal. He appears well-developed and well-nourished.  Moderately obese  HENT:  Head: Normocephalic and atraumatic.  Neck: Normal range of motion.  Cardiovascular: Normal rate, regular rhythm, normal heart sounds and intact distal pulses. Frequent extrasystoles are present.  1+ bilateral pitting edema  Pulmonary/Chest: Effort normal and breath sounds normal. No accessory muscle usage. No respiratory distress.  Abdominal: Soft. Bowel sounds are normal.  Musculoskeletal: Normal range of motion.  Neurological: He is alert and oriented to person, place, and time.  Skin: Skin is warm and dry.  Vitals reviewed.  Radiology: No results found.  Laboratory examination:    CMP Latest Ref Rng & Units 11/22/2015 05/19/2015  02/18/2015  Glucose 65 - 99 mg/dL 025(E) 527(P) 824(M)  BUN 7 - 25 mg/dL 16 13 7   Creatinine 0.70 - 1.25 mg/dL 3.53 6.14  Sodium 135 - 146 mmol/L 140 141 140  Potassium 3.5 - 5.3 mmol/L 4.2 4.1 3.6  Chloride 98 - 110 mmol/L 106 108 104  CO2 20 - 31 mmol/L 25 25 24   Calcium 8.6 - 10.3 mg/dL 9.0 9.0 4.31)  Total Protein 6.1 - 8.1 g/dL 7.6 6.5 6.8  Total Bilirubin 0.2 - 1.2 mg/dL 0.4 0.3 1.1  Alkaline Phos 40 - 115 U/L 70 54 49  AST 10 - 35 U/L 19 26 21   ALT 9 - 46 U/L 13 19 15(L)   CBC Latest Ref Rng & Units 11/22/2015 05/19/2015 02/18/2015  WBC 4.6 - 10.2 K/uL 8.4 8.1 10.3  Hemoglobin 14.1 - 18.1 g/dL 10.9(A) 11.2(L) 11.3(L)  Hematocrit 43.5 - 53.7 % 32.3(A) 34.6(L) 33.9(L)  Platelets 150 - 400 K/uL - 253 246   Lipid Panel     Component Value Date/Time   CHOL 171 02/17/2015 0816   TRIG 65 02/17/2015 0816   HDL 49 02/17/2015 0816   CHOLHDL 3.5 02/17/2015 0816   VLDL 13 02/17/2015 0816   LDLCALC 109 (H) 02/17/2015 0816   HEMOGLOBIN A1C Lab Results  Component Value Date   HGBA1C  5.9 09/05/2006   TSH No results for input(s): TSH in the last 8760 hours.  PRN Meds:. There are no discontinued medications. Current Meds  Medication Sig  . aspirin 81 MG tablet Take 81 mg by mouth daily.  . brimonidine (ALPHAGAN) 0.2 % ophthalmic solution Place 1 drop into both eyes 2 (two) times daily.   . hydrALAZINE (APRESOLINE) 25 MG tablet TAKE 1 TABLET(25 MG) BY MOUTH THREE TIMES DAILY  . latanoprost (XALATAN) 0.005 % ophthalmic solution Place 1 drop into both eyes at bedtime.  04/19/2015 levothyroxine (SYNTHROID, LEVOTHROID) 150 MCG tablet Take 150 mcg by mouth daily.  04/19/2015 losartan (COZAAR) 100 MG tablet Take 1 tablet (100 mg total) by mouth daily.  . metFORMIN (GLUCOPHAGE) 1000 MG tablet Take 1,000 mg by mouth 2 (two) times a day.   . Metoprolol Tartrate 75 MG TABS Take 75 mg by mouth 2 (two) times daily.   . Rosuvastatin Calcium 20 MG CPSP Take 20 mg by mouth daily.   09/07/2006 spironolactone (ALDACTONE) 25 MG tablet TAKE 1 TABLET BY MOUTH DAILY IN THE MORNING    Cardiac Studies:   48 hour holter 11/22/2018: Normal sinus rhythm with frequent PVC's (19.2%) including occasional PVC couplets and occasional episodes of PVC's in bigeminal and trigeminal pattern. No symptoms reported. No A fib.   Echocardiogram 11/22/2018: Left ventricle cavity is normal in size. Moderate concentric hypertrophy of the left ventricle. Mildly depressed LV systolic function with EF 40-45%. Mild global hypokinesis.  Indeterminate diastolic filling pattern.  Left atrial cavity is mildly dilated. Trileaflet aortic valve. Trace aortic regurgitation. Mild (Grade I) mitral regurgitation. Mild pulmonic regurgitation. IVC is dilated with a respiratory response of >50%. This suggests elevated right heart pressure. Compared to previous study on 11/20/2017, EF is mildly reduced.   Lexiscan myoview stress test 11/30/2017: 1.The resting electrocardiogram demonstrated normal sinus rhythm, normal resting conduction and frequent PVC  in bigeminal pattern. Stress EKG is non diagnostic due to pharmacologic stress with Lexiscan. PVC persisted. Stress symptoms included chest pressure. 2. Left ventricular cavity is noted to be enlarged on the rest and stress studies at 172 mL. This is an abnormal myocardial perfusion  imaging study demonstrating a mixture of scar plus ischemia in the basal inferior, mid inferior and apical inferior myocardial wall(s). Gated SPECT imaging demonstrates hypokinesis of the basal inferior, mid inferior and apical inferior myocardial wall(s). The left ventricular ejection fraction was calculated to be 41%. 3. This is an intermediate risk study, clinical correlation recommended.   Cath 12/18/17: Codominant circulation with no significant coronary artery disease. Mildly elevated LVEDP.  Frequent symptomatic PVC's of nonischemic etiology. Consider EP referral for further management given PVC burden 16% on high dose beta blocker (metoprolol tartarate 100-50 mg bid) and mildly reduced LVEF 49%  Assessment:   Frequent PVCs - Plan: Ambulatory referral to Cardiac Electrophysiology  Nonischemic cardiomyopathy (HCC) - Plan: Ambulatory referral to Cardiac Electrophysiology  Essential hypertension  Claudication [facility](HCC)  EKG 07/15/2018: Normal sinus rhythm at 76 bpm, left atrial enlargement, normal axis, LVH. No evidence of ischemia.   Recommendations:   I have discussed recently obtained echocardiogram as well as Holter monitor results.  He continues to have depressed LVEF 40 to 45%.  Holter monitor reveals 19% PVC burden, increased from previous Holter monitor showing 16% PVC burden.  He was previously evaluated by Dr. Elberta Fortisamnitz, will place referral back to him for reevaluation and further discussion of antiarrhythmic therapy versus PVC ablation.  Suspect his EF may improve with therapy of PVCs.  Symptomatically he has remained stable.    He does continue to have claudication symptoms, I had previously ordered lower  extremity arterial duplex, but this was not performed.  He has difficulty with making appointments due to strenuous work schedule.  I will plan to reevaluate his claudication symptoms at his next office visit.  No changes were made to medications today.  He did not have a way to check his blood pressure as he was at work.  He is also due for follow-up with his PCP, encouraged him to do this for follow-up on his diabetes.  I will see him back in 3 months for follow-up.  Toniann FailAshton Haynes Kelley, MSN, APRN, FNP-C [facility] [place] Office: [phone] Fax: [phone]

## 2019-02-05 ENCOUNTER — Other Ambulatory Visit: Payer: Self-pay

## 2019-02-05 DIAGNOSIS — I10 Essential (primary) hypertension: Secondary | ICD-10-CM

## 2019-02-05 MED ORDER — HYDRALAZINE HCL 25 MG PO TABS: ORAL_TABLET | ORAL | 2 refills | Status: DC

## 2019-02-05 MED ORDER — SPIRONOLACTONE 25 MG PO TABS: 25.0000 mg | ORAL_TABLET | Freq: Once | ORAL | 3 refills | Status: DC

## 2019-02-06 ENCOUNTER — Other Ambulatory Visit: Payer: Self-pay

## 2019-02-06 MED ORDER — SPIRONOLACTONE 25 MG PO TABS: 25.0000 mg | ORAL_TABLET | Freq: Every day | ORAL | 1 refills | Status: DC

## 2019-02-11 ENCOUNTER — Other Ambulatory Visit: Payer: Self-pay

## 2019-02-13 ENCOUNTER — Ambulatory Visit: Payer: Medicare HMO | Admitting: Cardiology

## 2019-03-20 DIAGNOSIS — E1159 Type 2 diabetes mellitus with other circulatory complications: Secondary | ICD-10-CM | POA: Diagnosis not present

## 2019-03-20 DIAGNOSIS — E1169 Type 2 diabetes mellitus with other specified complication: Secondary | ICD-10-CM | POA: Diagnosis not present

## 2019-03-20 DIAGNOSIS — E039 Hypothyroidism, unspecified: Secondary | ICD-10-CM | POA: Diagnosis not present

## 2019-03-20 DIAGNOSIS — Z23 Encounter for immunization: Secondary | ICD-10-CM | POA: Diagnosis not present

## 2019-04-10 ENCOUNTER — Other Ambulatory Visit: Payer: Self-pay

## 2019-04-10 DIAGNOSIS — I10 Essential (primary) hypertension: Secondary | ICD-10-CM

## 2019-04-10 MED ORDER — HYDRALAZINE HCL 25 MG PO TABS: ORAL_TABLET | ORAL | 2 refills | Status: DC

## 2019-04-17 ENCOUNTER — Other Ambulatory Visit: Payer: Self-pay

## 2019-04-17 NOTE — Patient Outreach (Signed)
  [facility]) Care Management Chronic Special Needs Program    04/17/2019  Name: Scott Bass, DOB: 07-Jun-1950  MRN: [id number]   [name] is enrolled in a chronic special needs plan for Diabetes. Telephone call to client to complete health risk assessment / initial assessment.  Unable to reach patient. HIPAA compliant voice message left with call back phone number.   PLAN:  RNCM will attempt 2nd telephone outreach call to client within 1 month.   Quinn Plowman RN,BSN,CCM [facility] [phone] '

## 2019-04-21 ENCOUNTER — Ambulatory Visit: Payer: Self-pay

## 2019-04-21 ENCOUNTER — Other Ambulatory Visit: Payer: Self-pay

## 2019-04-21 NOTE — Patient Outreach (Signed)
  [facility]) Care Management Chronic Special Needs Program    04/21/2019  Name: Scott Bass, DOB: July 06, 1950  MRN: [id number]  Return call received from client stating he does not have the Health team advantage CSNP health plan.  Client reports there was a mistake made with his broker and he does not have the CSNP health plan.  RNCM contacted HTA conceirge to verify client's coverage.  Received update from HTA conceirge, Hyman Bower that clients coverage ends 05/08/19. Initial assessment/ Health risk assessment not completed as clients Health team advantage/CSNP coverage ending.   PLAN:  No further outreach needed to  Client by this RNCM at this time.   Quinn Plowman RN,BSN,CCM [facility] [phone]

## 2019-04-30 ENCOUNTER — Encounter: Payer: Self-pay | Admitting: Cardiology

## 2019-05-14 ENCOUNTER — Other Ambulatory Visit: Payer: Self-pay

## 2019-05-14 NOTE — Patient Outreach (Signed)
  [facility]) Care Management Chronic Special Needs Program    05/14/2019  Name: Scott Bass, DOB: 1950-07-26  MRN: [id number]   RNCM received notification that client has dis-enrolled from the [employer] C-SNP plan therefore RNCM will no longer be able to follow as client's C-SNP Chronic Care Management Coordinator.   PLAN;  Close case and send case closure letter to primary care provider.   George Ina RN,BSN,CCM Chronic Care Management Coordinator [facility] Care Management [phone]

## 2019-05-28 ENCOUNTER — Other Ambulatory Visit: Payer: Self-pay | Admitting: Cardiology

## 2019-05-28 DIAGNOSIS — Q1381 Rieger's anomaly: Secondary | ICD-10-CM | POA: Diagnosis not present

## 2019-05-28 DIAGNOSIS — H25813 Combined forms of age-related cataract, bilateral: Secondary | ICD-10-CM | POA: Diagnosis not present

## 2019-05-28 DIAGNOSIS — H4089 Other specified glaucoma: Secondary | ICD-10-CM | POA: Diagnosis not present

## 2019-05-30 ENCOUNTER — Ambulatory Visit: Payer: Medicare HMO | Admitting: Cardiology

## 2019-06-03 ENCOUNTER — Encounter: Payer: Self-pay | Admitting: Cardiology

## 2019-06-03 ENCOUNTER — Other Ambulatory Visit: Payer: Self-pay

## 2019-06-03 ENCOUNTER — Ambulatory Visit (INDEPENDENT_AMBULATORY_CARE_PROVIDER_SITE_OTHER): Payer: Medicare HMO | Admitting: Cardiology

## 2019-06-03 VITALS — BP 144/82 | HR 82 | Temp 98.7°F | Ht 67.0 in | Wt 238.0 lb

## 2019-06-03 DIAGNOSIS — I493 Ventricular premature depolarization: Secondary | ICD-10-CM

## 2019-06-03 DIAGNOSIS — I10 Essential (primary) hypertension: Secondary | ICD-10-CM | POA: Diagnosis not present

## 2019-06-03 DIAGNOSIS — I739 Peripheral vascular disease, unspecified: Secondary | ICD-10-CM | POA: Diagnosis not present

## 2019-06-03 DIAGNOSIS — I428 Other cardiomyopathies: Secondary | ICD-10-CM | POA: Diagnosis not present

## 2019-06-03 MED ORDER — HYDRALAZINE HCL 50 MG PO TABS: 50.0000 mg | ORAL_TABLET | Freq: Three times a day (TID) | ORAL | 2 refills | Status: DC

## 2019-06-03 NOTE — Progress Notes (Signed)
Primary Physician:  Tracey Harries, MD   Patient ID: Scott Bass, male    DOB: [birth year], 69 y.o.   MRN: [id number]  Subjective:     Chief Complaint  Patient presents with  . Cardiomyopathy  . PVC  . Follow-up    43mo    HPI: Scott Bass  is a 69 y.o. male  with hypertension, hyperlipidemia, OSA on CPAP,  type 2 diabetes, frequent PVC's, and nonischemic cardiomyopathy. 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  Lexiscan nuclear stress test was considered high risk; therefore, underwent coronary angiogram on 12/18/2017 revealing normal coronary arteries and mildly reduced LVEF.  Patient was last seen in Sept 2020. Holter monitor was repeated at that time  revealed increased PVC burden of 19% despite being on high dose beta blocker. Echocardiogram also showed worsening LVEF of 40-45%. I had again referred him back to EP; however, patient later cancelled his appointment as he did not feel that he needed to go.   Patient reports that he is doing well. He denies any chest pain. Has chronic dyspnea on exertion that is unchanged and minimal leg edema. He does continue to report pain and fatigue in his legs with walking. I had previously ordered LE arterial duplex, but this was not performed as patient was concerned about the cost. He does not feel that his symptoms are severe enough to warrant evaluation.  Continues to work for [employer] in [employer] and [employer] and [employer] supplies. No family history of heart disease.  Past Medical History:  Diagnosis Date  . Arthritis    "lower back" (12/18/2017)  . GERD (gastroesophageal reflux disease)   . Glaucoma, both eyes   . High cholesterol   . Hypertension   . Hypothyroidism   . Irregular heartbeat   . Legally blind    "both eyes; can see some out of left eye"  . OSA (obstructive sleep apnea)   . Thyroid disease   . Type II diabetes mellitus (HCC)   . Vitamin  B12 deficiency     Past Surgical History:  Procedure Laterality Date  . LEFT HEART CATH AND CORONARY ANGIOGRAPHY N/A 12/18/2017   Procedure: LEFT HEART CATH AND CORONARY ANGIOGRAPHY;  Surgeon: Elder Negus, MD;  Location: [facility];  Service: Cardiovascular;  Laterality: N/A;  . NOSE SURGERY     "was crooked; they straightened it out" (12/18/2017)    Social History   Socioeconomic History  . Marital status: Married    Spouse name: Not on file  . Number of children: 0  . Years of education: Not on file  . Highest education level: Not on file  Occupational History  . Occupation: retired  Tobacco Use  . Smoking status: Never Smoker  . Smokeless tobacco: Never Used  Substance and Sexual Activity  . Alcohol use: Yes    Comment: occ  . Drug use: Never  . Sexual activity: Not Currently  Other Topics Concern  . Not on file  Social History Narrative  . Not on file   Social Determinants of Health   Financial Resource Strain:   . Difficulty of Paying Living Expenses: Not on file  Food Insecurity:   . Worried About Programme researcher, broadcasting/film/video in the Last Year: Not on file  . Ran Out of Food in the Last Year: Not on file  Transportation Needs:   . Lack of Transportation (Medical): Not on file  .  Lack of Transportation (Non-Medical): Not on file  Physical Activity:   . Days of Exercise per Week: Not on file  . Minutes of Exercise per Session: Not on file  Stress:   . Feeling of Stress : Not on file  Social Connections:   . Frequency of Communication with Friends and Family: Not on file  . Frequency of Social Gatherings with Friends and Family: Not on file  . Attends Religious Services: Not on file  . Active Member of Clubs or Organizations: Not on file  . Attends Archivist Meetings: Not on file  . Marital Status: Not on file  Intimate Partner Violence:   . Fear of Current or Ex-Partner: Not on file  . Emotionally Abused: Not on file  . Physically Abused:  Not on file  . Sexually Abused: Not on file    Review of Systems  Constitution: Negative for decreased appetite, malaise/fatigue, weight gain and weight loss.  Eyes: Negative for visual disturbance.  Cardiovascular: Positive for claudication (improved), dyspnea on exertion and leg swelling. Negative for chest pain, orthopnea, palpitations and syncope.  Respiratory: Negative for hemoptysis and wheezing.   Endocrine: Negative for cold intolerance and heat intolerance.  Hematologic/Lymphatic: Does not bruise/bleed easily.  Skin: Negative for nail changes.  Musculoskeletal: Negative for muscle weakness and myalgias.  Gastrointestinal: Negative for abdominal pain, change in bowel habit, nausea and vomiting.  Neurological: Negative for difficulty with concentration, dizziness, focal weakness and headaches.  Psychiatric/Behavioral: Negative for altered mental status and suicidal ideas.  All other systems reviewed and are negative.     Objective:  Blood pressure (!) 144/82, pulse 82, temperature 98.7 F (37.1 C), height 5\' 7"  (1.702 m), weight 238 lb (108 kg), SpO2 100 %. Body mass index is 37.28 kg/m.     Physical Exam  Constitutional: He is oriented to person, place, and time. Vital signs are normal. He appears well-developed and well-nourished.  Moderately obese  HENT:  Head: Normocephalic and atraumatic.  Cardiovascular: Normal rate, regular rhythm, normal heart sounds and intact distal pulses. Frequent extrasystoles are present.  1+ bilateral pitting edema  Pulmonary/Chest: Effort normal and breath sounds normal. No accessory muscle usage. No respiratory distress.  Abdominal: Soft. Bowel sounds are normal.  Musculoskeletal:        General: Normal range of motion.     Cervical back: Normal range of motion.  Neurological: He is alert and oriented to person, place, and time.  Skin: Skin is warm and dry.  Vitals reviewed.  Radiology: No results found.  Laboratory examination:     CMP Latest Ref Rng & Units 11/22/2015 05/19/2015 02/18/2015  Glucose 65 - 99 mg/dL 172(H) 144(H) 108(H)  BUN 7 - 25 mg/dL 16 13 7   Creatinine 0.70 - 1.25 mg/dL 1.21 1.13 1.05  Sodium 135 - 146 mmol/L 140 141 140  Potassium 3.5 - 5.3 mmol/L 4.2 4.1 3.6  Chloride 98 - 110 mmol/L 106 108 104  CO2 20 - 31 mmol/L 25 25 24   Calcium 8.6 - 10.3 mg/dL 9.0 9.0 8.5(L)  Total Protein 6.1 - 8.1 g/dL 7.6 6.5 6.8  Total Bilirubin 0.2 - 1.2 mg/dL 0.4 0.3 1.1  Alkaline Phos 40 - 115 U/L 70 54 49  AST 10 - 35 U/L 19 26 21   ALT 9 - 46 U/L 13 19 15(L)   CBC Latest Ref Rng & Units 11/22/2015 05/19/2015 02/18/2015  WBC 4.6 - 10.2 K/uL 8.4 8.1 10.3  Hemoglobin 14.1 - 18.1 g/dL 10.9(A) 11.2(L) 11.3(L)  Hematocrit 43.5 - 53.7 % 32.3(A) 34.6(L) 33.9(L)  Platelets 150 - 400 K/uL - 253 246   Lipid Panel     Component Value Date/Time   CHOL 171 02/17/2015 0816   TRIG 65 02/17/2015 0816   HDL 49 02/17/2015 0816   CHOLHDL 3.5 02/17/2015 0816   VLDL 13 02/17/2015 0816   LDLCALC 109 (H) 02/17/2015 0816   HEMOGLOBIN A1C Lab Results  Component Value Date   HGBA1C 5.9 09/05/2006   TSH No results for input(s): TSH in the last 8760 hours.  PRN Meds:. Medications Discontinued During This Encounter  Medication Reason  . hydrALAZINE (APRESOLINE) 25 MG tablet Dose change   Current Meds  Medication Sig  . aspirin 81 MG tablet Take 81 mg by mouth daily.  . brimonidine (ALPHAGAN) 0.2 % ophthalmic solution Place 1 drop into both eyes 2 (two) times daily.   Marland Kitchen latanoprost (XALATAN) 0.005 % ophthalmic solution Place 1 drop into both eyes at bedtime.  Marland Kitchen levothyroxine (SYNTHROID, LEVOTHROID) 150 MCG tablet Take 150 mcg by mouth daily.  Marland Kitchen losartan (COZAAR) 100 MG tablet Take 1 tablet (100 mg total) by mouth daily.  . metFORMIN (GLUCOPHAGE) 1000 MG tablet Take 1,000 mg by mouth 2 (two) times a day.   . Metoprolol Tartrate 75 MG TABS Take 75 mg by mouth 2 (two) times daily.   . Rosuvastatin Calcium 20 MG CPSP Take  20 mg by mouth daily.   Marland Kitchen spironolactone (ALDACTONE) 25 MG tablet TAKE 1 TABLET BY MOUTH DAILY IN THE MORNING  . [DISCONTINUED] hydrALAZINE (APRESOLINE) 25 MG tablet TAKE 1 TABLET(25 MG) BY MOUTH THREE TIMES DAILY    Cardiac Studies:   48 hour holter 11/22/2018: Normal sinus rhythm with frequent PVC's (19.2%) including occasional PVC couplets and occasional episodes of PVC's in bigeminal and trigeminal pattern. No symptoms reported. No A fib.   Echocardiogram 11/22/2018: Left ventricle cavity is normal in size. Moderate concentric hypertrophy of the left ventricle. Mildly depressed LV systolic function with EF 40-45%. Mild global hypokinesis.  Indeterminate diastolic filling pattern.  Left atrial cavity is mildly dilated. Trileaflet aortic valve. Trace aortic regurgitation. Mild (Grade I) mitral regurgitation. Mild pulmonic regurgitation. IVC is dilated with a respiratory response of >50%. This suggests elevated right heart pressure. Compared to previous study on 11/20/2017, EF is mildly reduced.   Lexiscan myoview stress test 11/30/2017: 1.The resting electrocardiogram demonstrated normal sinus rhythm, normal resting conduction and frequent PVC in bigeminal pattern. Stress EKG is non diagnostic due to pharmacologic stress with Lexiscan. PVC persisted. Stress symptoms included chest pressure. 2. Left ventricular cavity is noted to be enlarged on the rest and stress studies at 172 mL. This is an abnormal myocardial perfusion imaging study demonstrating a mixture of scar plus ischemia in the basal inferior, mid inferior and apical inferior myocardial wall(s). Gated SPECT imaging demonstrates hypokinesis of the basal inferior, mid inferior and apical inferior myocardial wall(s). The left ventricular ejection fraction was calculated to be 41%. 3. This is an intermediate risk study, clinical correlation recommended.   Cath 12/18/17: Codominant circulation with no significant coronary artery disease.  Mildly elevated LVEDP.  Frequent symptomatic PVC's of nonischemic etiology. Consider EP referral for further management given PVC burden 16% on high dose beta blocker (metoprolol tartarate 100-50 mg bid) and mildly reduced LVEF 49%  Assessment:   Frequent PVCs - Plan: EKG 12-Lead  Nonischemic cardiomyopathy (HCC)  Essential hypertension  Claudication (HCC)  EKG 06/03/2019: Normal sinus rhythm at 84 bpm, normal axis, LVH. Diffuse  nonspecific T wave abnormality.    Recommendations:   Patient is doing well since last seen by me in September 2020.  He unfortunately did not keep his appointment with Dr. Elberta Fortis for evaluation of frequent PVCs.  I was concerned about his elevated PVC burden and cardiomyopathy.  Patient does not feel like he needs to be evaluated by them as he is symptomatically doing well.  I have again stressed to him that his cardiomyopathy will likely not improve without treatment for PVCs.  He is on high-dose beta-blocker and has continued to have PVCs; however, does not have PVCs noted on EKG today. No clinical evidence of heart failure.  His dyspnea on exertion has been stable.  As he is not willing to be reevaluated by EP at this point, will need to closely monitor his LVEF.  We will plan to repeat echocardiogram after his next office visit.  His blood pressure is uncontrolled which could also be contributing to depressed EF.  I have increased his hydralazine to 50 mg 3 times daily.  He is on losartan.  Could consider Entresto therapy; however, cost has been an issue in the past.  Could also consider BiDil.  I have reviewed his labs that were performed in November with PCP, kidney function is stable.  Cholesterol has improved since last check in January 2020.  Continue with statin therapy.  This is being managed by his PCP.  He has mentioned in the past some leg fatigue with walking, suggestive of symptoms of claudication.  I recommended lower extremity duplex several times in  the past for further evaluation; however, patient did not have this performed on multiple occasions.  I have again discussed indications for having this done given his symptoms, the patient continues to defer for now as he states he does not feel that his symptoms are bad enough to have further testing done.  No evidence of ischemic limb or ulcerations.  We will continue to monitor.  We will have him follow-up in 2 months follow-up on hypertension, but encouraged him to contact me sooner if needed.   Toniann Fail, MSN, APRN, FNP-C [facility] Office: [phone] Fax: [phone]

## 2019-06-29 ENCOUNTER — Other Ambulatory Visit: Payer: Self-pay | Admitting: Cardiology

## 2019-06-29 DIAGNOSIS — I10 Essential (primary) hypertension: Secondary | ICD-10-CM

## 2019-07-01 ENCOUNTER — Other Ambulatory Visit: Payer: Self-pay | Admitting: Cardiology

## 2019-07-01 ENCOUNTER — Telehealth: Payer: Self-pay

## 2019-07-01 DIAGNOSIS — I428 Other cardiomyopathies: Secondary | ICD-10-CM

## 2019-07-01 MED ORDER — HYDRALAZINE HCL 50 MG PO TABS: 50.0000 mg | ORAL_TABLET | Freq: Three times a day (TID) | ORAL | 2 refills | Status: DC

## 2019-07-01 NOTE — Telephone Encounter (Signed)
Patient called and is requesting a refill on Metoprolol, but he couldn't remember if you said decreased or if it was increased to 2 tablets daily and is not sure. Is Metoprolol 75mg  BID the correct dosage.

## 2019-07-02 NOTE — Telephone Encounter (Signed)
Metoprolol 75 mg BID is correct

## 2019-07-02 NOTE — Telephone Encounter (Signed)
Called and left a message on VM about directions on how to take Metoprolol.

## 2019-07-04 NOTE — Telephone Encounter (Signed)
2nd attempt to contact patient : LMAM.

## 2019-08-11 ENCOUNTER — Ambulatory Visit: Payer: Medicare HMO | Admitting: Cardiology

## 2019-08-28 ENCOUNTER — Other Ambulatory Visit: Payer: Self-pay | Admitting: Cardiology

## 2019-09-08 NOTE — Progress Notes (Signed)
Primary Physician/Referring:  Bernerd Limbo, MD  Patient ID: Scott Bass, male    DOB: [birth year], 69 y.o.   MRN: [id number]  Chief Complaint  Patient presents with  . Hypertension  . Cardiomyopathy  . PVC's  . Follow-up    2 month   HPI:    Scott Bass  is a 69 y.o. 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 I had again referred him to EP; however, patient later cancelled his appointment as he did not feel that he needed to go.   Patient reports that he is doing well. He denies any chest pain. Has chronic dyspnea on exertion that is unchanged and minimal leg edema.   Continues to work for [employer] in [employer] and [employer] and [employer] supplies. No family history of heart disease.   Past Medical History:  Diagnosis Date  . Arthritis    "lower back" (12/18/2017)  . GERD (gastroesophageal reflux disease)   . Glaucoma, both eyes   . High cholesterol   . Hypertension   . Hypothyroidism   . Irregular heartbeat   . Legally blind    "both eyes; can see some out of left eye"  . OSA (obstructive sleep apnea)   . Thyroid disease   . Type II diabetes mellitus ([place])   . Vitamin B12 deficiency    Past Surgical History:  Procedure Laterality Date  . LEFT HEART CATH AND CORONARY ANGIOGRAPHY N/A 12/18/2017   Procedure: LEFT HEART CATH AND CORONARY ANGIOGRAPHY;  Surgeon: Nigel Mormon, MD;  Location: [place] [facility];  Service: Cardiovascular;  Laterality: N/A;  . NOSE SURGERY     "was crooked; they straightened it out" (12/18/2017)   Family History  Problem Relation Age of Onset  . Diabetes Mellitus II Sister   . Breast cancer Sister     Social History   Tobacco Use  . Smoking status: Never Smoker  . Smokeless tobacco: Never Used  Substance  Use Topics  . Alcohol use: Yes    Comment: occ   Marital Status: Married  ROS  Review of Systems  Eyes: Positive for vision loss in left eye and vision loss in right eye.  Cardiovascular: Positive for dyspnea on exertion (mild and stable). Negative for chest pain and leg swelling.  Gastrointestinal: Negative for melena.   Objective  Blood pressure (!) 143/82, pulse 84, temperature 98.4 F (36.9 C), height 5' 6.5" (1.689 m), weight 234 lb 4.8 oz (106.3 kg), SpO2 96 %.  Vitals with BMI 09/10/2019 06/03/2019 06/03/2019  Height 5' 6.5" - -  Weight 234 lbs 5 oz - -  BMI 50.03 - -  Systolic 704 888 916  Diastolic 82 82 85  Pulse 84 - 82     Physical Exam  Constitutional: He appears well-developed and well-nourished. No distress.  Moderately obese   Neck: No JVD present.  Cardiovascular: Normal rate, regular rhythm and intact distal pulses. Exam reveals no gallop.  No murmur heard. Frequent extrasystoles are present.  1+ bilateral pitting edema   Pulmonary/Chest: Effort normal and breath sounds normal. No accessory muscle usage. No respiratory distress.  Abdominal: Soft. Bowel sounds are normal.   Laboratory examination:   External labs:   03/20/2019: Glucose 97, BUN 14, Creatinine, Serum 1.02, eGFR If African American 88, BUN/Creatinine Ratio 14,  Sodium 139, Potassium 4.2, Alkaline Phosphatase 65, AST 30, ALT 23, Rest of CMP normal.  Lipids: Cholesterol, Total 159, Triglycerides 69, HDL 4, VLDL 13, LDL 97.  HBA1C 6.6%.  TSH 3.120  Vitamin B-12 253   09/10/2017: Hematocrit 32.7, WBC 7.0, RBC 3.92, hemoglobin 10.7, platelets 314, MCV 83.5, RDW 18.2. Rest of CBC normal  Medications and allergies   Allergies  Allergen Reactions  . Hctz [Hydrochlorothiazide] Other (See Comments)    Pancreatitis      Current Outpatient Medications  Medication Instructions  . aspirin 81 mg, Oral, Daily  . brimonidine (ALPHAGAN) 0.2 % ophthalmic solution 1 drop, Both Eyes, 3 times daily  .  hydrALAZINE (APRESOLINE) 50 mg, Oral, 3 times daily  . latanoprost (XALATAN) 0.005 % ophthalmic solution 1 drop, Both Eyes, Daily at bedtime  . levothyroxine (SYNTHROID) 150 mcg, Oral, Daily  . losartan (COZAAR) 100 mg, Oral, Daily  . metFORMIN (GLUCOPHAGE) 500 mg, Oral, 2 times daily with meals  . Metoprolol Tartrate 75 mg, Oral, 2 times daily  . Rosuvastatin Calcium 20 mg, Oral, Daily  . spironolactone (ALDACTONE) 25 MG tablet TAKE 1 TABLET BY MOUTH DAILY IN THE MORNING   Radiology:   No results found.  Cardiac Studies:   Home sleep study 07/24/2016: Severe obstructive sleep apnea with an AHI of 32.2 and SA 02 low of 75%.  Lexiscan myoview stress test 11/30/2017: 1.The resting electrocardiogram demonstrated normal sinus rhythm, normal resting conduction and frequent PVC in bigeminal pattern. Stress EKG is non diagnostic due to pharmacologic stress with Lexiscan. PVC persisted. Stress symptoms included chest pressure. 2. Left ventricular cavity is noted to be enlarged on the rest and stress studies at 172 mL. This is an abnormal myocardial perfusion imaging study demonstrating a mixture of scar plus ischemia in the basal inferior, mid inferior and apical inferior myocardial wall(s). Gated SPECT imaging demonstrates hypokinesis of the basal inferior, mid inferior and apical inferior myocardial wall(s). The left ventricular ejection fraction was calculated to be 41%. 3. This is an intermediate risk study, clinical correlation recommended.   Coronary angiogram 12/18/17: Codominant circulation with no significant coronary artery disease. Mildly elevated LVEDP.  Frequent symptomatic PVC's of nonischemic etiology. Consider EP referral for further management given PVC burden 16% on high dose beta blocker (metoprolol tartarate 100-50 mg bid) and mildly reduced LVEF 49%  Echocardiogram 11/22/2018: Left ventricle cavity is normal in size. Moderate concentric hypertrophy of the left ventricle. Mildly  depressed LV systolic function with EF 40-45%. Mild global hypokinesis.  Indeterminate diastolic filling pattern.  Left atrial cavity is mildly dilated. Trileaflet aortic valve. Trace aortic regurgitation. Mild (Grade I) mitral regurgitation. Mild pulmonic regurgitation. IVC is dilated with a respiratory response of >50%. This suggests elevated right heart pressure. Compared to previous study on 11/20/2017, EF is mildly reduced.   48 hour holter 11/22/2018: Normal sinus rhythm with frequent PVC's (19.2%) including occasional PVC couplets and occasional episodes of PVC's in bigeminal and trigeminal pattern. No symptoms reported. No A fib.   EKG    06/03/2019: Normal sinus rhythm at 84 bpm, normal axis, LVH. Diffuse nonspecific T wave abnormality.    Assessment     ICD-10-CM   1. Frequent PVCs  I49.3   2. Nonischemic cardiomyopathy (HCC)  I42.8   3. Essential hypertension  I10   4. Claudication ([place])  I73.9      No orders of the defined types were placed in this encounter.   There are no discontinued medications.  Recommendations:  Scott Bass  is a 69 y.o.  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 I had again referred him to EP; however, patient later cancelled his appointment as he did not feel that he needed to go.  His blood pressure is elevated today, I have added amlodipine 5 mg daily.  He is intolerant to HCTZ and currently on Aldactone, had developed pancreatitis on HCTZ.  Lipids are well controlled, today there is no clinical evidence of heart failure, he is tolerating relatively high dose of beta-blocker, he did not want to increase metoprolol 200 mg twice daily due to potential for low heart rate.  During exam he did not have any ectopics.  I would like to repeat echocardiogram to  see if his LVEF is improved further.  He is now on appropriate medical therapy.  I will see him back in 2 months for follow-up of hypertension and cardiomyopathy.  Weight loss was discussed with the patient.  Essentially eats 3 meals a day outside as his wife is also blind.  Advised him at the cafeteria to eat half his food.   Adrian Prows, MD, [facility] 09/10/2019, 1:20 PM [facility] Pager: [phone] Office: [phone]

## 2019-09-10 ENCOUNTER — Ambulatory Visit: Payer: Medicare HMO | Admitting: Cardiology

## 2019-09-10 ENCOUNTER — Encounter: Payer: Self-pay | Admitting: Cardiology

## 2019-09-10 ENCOUNTER — Other Ambulatory Visit: Payer: Self-pay

## 2019-09-10 VITALS — BP 143/82 | HR 84 | Temp 98.4°F | Ht 66.5 in | Wt 234.3 lb

## 2019-09-10 DIAGNOSIS — I493 Ventricular premature depolarization: Secondary | ICD-10-CM

## 2019-09-10 DIAGNOSIS — I739 Peripheral vascular disease, unspecified: Secondary | ICD-10-CM | POA: Diagnosis not present

## 2019-09-10 DIAGNOSIS — I428 Other cardiomyopathies: Secondary | ICD-10-CM

## 2019-09-10 DIAGNOSIS — I10 Essential (primary) hypertension: Secondary | ICD-10-CM

## 2019-09-10 MED ORDER — AMLODIPINE BESYLATE 5 MG PO TABS: 5.0000 mg | ORAL_TABLET | Freq: Every day | ORAL | 2 refills | Status: DC

## 2019-09-17 ENCOUNTER — Other Ambulatory Visit: Payer: Medicare HMO

## 2019-09-17 DIAGNOSIS — E1159 Type 2 diabetes mellitus with other circulatory complications: Secondary | ICD-10-CM | POA: Diagnosis not present

## 2019-09-17 DIAGNOSIS — I152 Hypertension secondary to endocrine disorders: Secondary | ICD-10-CM | POA: Diagnosis not present

## 2019-09-17 DIAGNOSIS — Z0000 Encounter for general adult medical examination without abnormal findings: Secondary | ICD-10-CM | POA: Diagnosis not present

## 2019-09-17 DIAGNOSIS — E1169 Type 2 diabetes mellitus with other specified complication: Secondary | ICD-10-CM | POA: Diagnosis not present

## 2019-09-17 DIAGNOSIS — Z6835 Body mass index (BMI) 35.0-35.9, adult: Secondary | ICD-10-CM | POA: Diagnosis not present

## 2019-09-17 DIAGNOSIS — E782 Mixed hyperlipidemia: Secondary | ICD-10-CM | POA: Diagnosis not present

## 2019-09-18 ENCOUNTER — Other Ambulatory Visit: Payer: Medicare HMO

## 2019-09-23 ENCOUNTER — Ambulatory Visit: Payer: Medicare HMO

## 2019-09-23 ENCOUNTER — Other Ambulatory Visit: Payer: Self-pay

## 2019-09-23 DIAGNOSIS — I10 Essential (primary) hypertension: Secondary | ICD-10-CM | POA: Diagnosis not present

## 2019-09-23 DIAGNOSIS — I428 Other cardiomyopathies: Secondary | ICD-10-CM | POA: Diagnosis not present

## 2019-10-07 NOTE — Progress Notes (Signed)
LVEF improved to normal from mild LV dysfunction.  Discuss on OV

## 2019-11-13 ENCOUNTER — Ambulatory Visit: Payer: Medicare HMO | Admitting: Cardiology

## 2019-11-13 ENCOUNTER — Encounter: Payer: Self-pay | Admitting: Cardiology

## 2019-11-13 ENCOUNTER — Other Ambulatory Visit: Payer: Self-pay

## 2019-11-13 VITALS — BP 130/73 | HR 74 | Resp 14 | Ht 66.5 in | Wt 234.0 lb

## 2019-11-13 DIAGNOSIS — I428 Other cardiomyopathies: Secondary | ICD-10-CM

## 2019-11-13 DIAGNOSIS — I493 Ventricular premature depolarization: Secondary | ICD-10-CM

## 2019-11-13 DIAGNOSIS — I10 Essential (primary) hypertension: Secondary | ICD-10-CM

## 2019-11-13 NOTE — Progress Notes (Signed)
Primary Physician/Referring:  Bernerd Limbo, MD  Patient ID: Scott Bass, male    DOB: 1951/01/28, 69 y.o.   MRN: [id number]  Chief Complaint  Patient presents with  . Cardiomyopathy  . Hypertension   HPI:    Scott Bass  is a 69 y.o. 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 I had again referred him to EP; however, patient later cancelled his appointment as he did not feel that he needed to go.   Patient reports that he is doing well. He denies any chest pain. Has chronic dyspnea on exertion that is unchanged and minimal leg edema.  He continues to have palpitations frequently.  Past Medical History:  Diagnosis Date  . Arthritis    "lower back" (12/18/2017)  . GERD (gastroesophageal reflux disease)   . Glaucoma, both eyes   . High cholesterol   . Hypertension   . Hypothyroidism   . Irregular heartbeat   . Legally blind    "both eyes; can see some out of left eye"  . OSA (obstructive sleep apnea)   . Thyroid disease   . Type II diabetes mellitus ([place])   . Vitamin B12 deficiency    Past Surgical History:  Procedure Laterality Date  . LEFT HEART CATH AND CORONARY ANGIOGRAPHY N/A 12/18/2017   Procedure: LEFT HEART CATH AND CORONARY ANGIOGRAPHY;  Surgeon: Nigel Mormon, MD;  Location: [facility];  Service: Cardiovascular;  Laterality: N/A;  . NOSE SURGERY     "was crooked; they straightened it out" (12/18/2017)   Family History  Problem Relation Age of Onset  . Diabetes Mellitus II Sister   . Breast cancer Sister     Social History   Tobacco Use  . Smoking status: Never Smoker  . Smokeless tobacco: Never Used  Substance Use Topics  . Alcohol use: Yes    Comment: occ   Marital Status: Married  ROS  Review of Systems  Eyes: Positive for vision loss in  left eye and vision loss in right eye.  Cardiovascular: Positive for dyspnea on exertion (mild and stable) and palpitations. Negative for chest pain and leg swelling.  Gastrointestinal: Negative for melena.   Objective  Blood pressure 130/73, pulse 74, resp. rate 14, height 5' 6.5" (1.689 m), weight 234 lb (106.1 kg), SpO2 100 %.  Vitals with BMI 11/13/2019 09/10/2019 06/03/2019  Height 5' 6.5" 5' 6.5" -  Weight 234 lbs 234 lbs 5 oz -  BMI 25.75 05.18 -  Systolic 335 825 189  Diastolic 73 82 82  Pulse 74 84 -     Physical Exam Constitutional:      General: He is not in acute distress.    Appearance: He is well-developed.     Comments: Moderately obese   Neck:     Vascular: No JVD.  Cardiovascular:     Rate and Rhythm: Normal rate and regular rhythm.     Pulses: Normal pulses and intact distal pulses.     Heart sounds: No murmur heard.  No gallop.      Comments: 1+ bilateral pitting edema  Pulmonary:     Effort: Pulmonary effort is normal. No accessory muscle usage or respiratory distress.     Breath sounds: Normal breath sounds.  Abdominal:     General: Bowel sounds are normal.  Palpations: Abdomen is soft.    Laboratory examination:   External labs:   03/20/2019: Glucose 97, BUN 14, Creatinine, Serum 1.02, eGFR If African American 88, BUN/Creatinine Ratio 14, Sodium 139, Potassium 4.2, Alkaline Phosphatase 65, AST 30, ALT 23, Rest of CMP normal.  Lipids: Cholesterol, Total 159, Triglycerides 69, HDL 4, VLDL 13, LDL 97.  HBA1C 6.6%.  TSH 3.120  Vitamin B-12 253   09/10/2017: Hematocrit 32.7, WBC 7.0, RBC 3.92, hemoglobin 10.7, platelets 314, MCV 83.5, RDW 18.2. Rest of CBC normal  Medications and allergies   Allergies  Allergen Reactions  . Hctz [Hydrochlorothiazide] Other (See Comments)    Pancreatitis      Current Outpatient Medications  Medication Instructions  . amLODipine (NORVASC) 5 mg, Oral, Daily  . aspirin 81 mg, Oral, Daily  . brimonidine  (ALPHAGAN) 0.2 % ophthalmic solution 1 drop, Both Eyes, 3 times daily  . hydrALAZINE (APRESOLINE) 50 mg, Oral, 3 times daily  . latanoprost (XALATAN) 0.005 % ophthalmic solution 1 drop, Both Eyes, Daily at bedtime  . levothyroxine (SYNTHROID) 150 mcg, Oral, Daily  . metFORMIN (GLUCOPHAGE) 500 mg, Oral, 2 times daily with meals  . Metoprolol Tartrate 75 mg, Oral, 2 times daily  . Rosuvastatin Calcium 20 mg, Oral, Daily  . spironolactone (ALDACTONE) 25 MG tablet TAKE 1 TABLET BY MOUTH DAILY IN THE MORNING   Radiology:   No results found.  Cardiac Studies:   Home sleep study 07/24/2016: Severe obstructive sleep apnea with an AHI of 32.2 and SA 02 low of 75%.  Lexiscan myoview stress test 11/30/2017: 1.The resting electrocardiogram demonstrated normal sinus rhythm, normal resting conduction and frequent PVC in bigeminal pattern. Stress EKG is non diagnostic due to pharmacologic stress with Lexiscan. PVC persisted. Stress symptoms included chest pressure. 2. Left ventricular cavity is noted to be enlarged on the rest and stress studies at 172 mL. This is an abnormal myocardial perfusion imaging study demonstrating a mixture of scar plus ischemia in the basal inferior, mid inferior and apical inferior myocardial wall(s). Gated SPECT imaging demonstrates hypokinesis of the basal inferior, mid inferior and apical inferior myocardial wall(s). The left ventricular ejection fraction was calculated to be 41%. 3. This is an intermediate risk study, clinical correlation recommended.   Coronary angiogram 12/18/17: Codominant circulation with no significant coronary artery disease. Mildly elevated LVEDP.  Frequent symptomatic PVC's of nonischemic etiology. Consider EP referral for further management given PVC burden 16% on high dose beta blocker (metoprolol tartarate 100-50 mg bid) and mildly reduced LVEF 49%  Echocardiogram 11/22/2018: Left ventricle cavity is normal in size. Moderate concentric hypertrophy  of the left ventricle. Mildly depressed LV systolic function with EF 40-45%. Mild global hypokinesis.  Indeterminate diastolic filling pattern.  Left atrial cavity is mildly dilated. Trileaflet aortic valve. Trace aortic regurgitation. Mild (Grade I) mitral regurgitation. Mild pulmonic regurgitation. IVC is dilated with a respiratory response of >50%. This suggests elevated right heart pressure. Compared to previous study on 11/20/2017, EF is mildly reduced.   48 hour holter 11/22/2018: Normal sinus rhythm with frequent PVC's (19.2%) including occasional PVC couplets and occasional episodes of PVC's in bigeminal and trigeminal pattern. No symptoms reported. No A fib.   Echocardiogram 11/05/2019: 1. Left ventricle cavity is normal in size. Moderate concentric hypertrophy of the left ventricle. Normal global wall motion. Normal LV systolic function with EF 55-60%. Doppler evidence of grade I (impaired) diastolic dysfunction, normal LAP. 2. No significant valvular abnormalities. Normal right atrial pressure. 3. Compared to previous study on  11/22/2018, LVEF has improved from 40-45% to  55-60%.  EKG  EKG 11/13/2019: Normal sinus rhythm with rate of 72 bpm, normal axis.  No evidence of ischemia, normal EKG. compared to 06/03/2019, diffuse nonspecific T abnormality not present, LVH not present.   Assessment     ICD-10-CM   1. Nonischemic cardiomyopathy (HCC)  I42.8 EKG 12-Lead  2. Frequent PVCs  I49.3   3. Essential hypertension  I10      No orders of the defined types were placed in this encounter.   There are no discontinued medications.  Recommendations:   FREDDY KINNE  is a 69 y.o.  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 I had again referred him to EP; however, patient later cancelled his  appointment as he did not feel that he needed to go.  I reviewed the results of the echocardiogram, LVEF improved.  His blood pressure is also now well controlled.  Diabetes mellitus.  Today the EKG reveals normal sinus rhythm without ST-T abnormality.  Continue present medications, I will see him back in 1 year or sooner if problems.   Adrian Prows, MD, [facility] 11/13/2019, 2:35 PM Office: [phone]

## 2019-11-26 DIAGNOSIS — H25813 Combined forms of age-related cataract, bilateral: Secondary | ICD-10-CM | POA: Diagnosis not present

## 2019-11-26 DIAGNOSIS — H4089 Other specified glaucoma: Secondary | ICD-10-CM | POA: Diagnosis not present

## 2019-11-26 DIAGNOSIS — Q1381 Rieger's anomaly: Secondary | ICD-10-CM | POA: Diagnosis not present

## 2019-12-05 DIAGNOSIS — I152 Hypertension secondary to endocrine disorders: Secondary | ICD-10-CM | POA: Diagnosis not present

## 2019-12-05 DIAGNOSIS — E1159 Type 2 diabetes mellitus with other circulatory complications: Secondary | ICD-10-CM | POA: Diagnosis not present

## 2019-12-05 DIAGNOSIS — M5442 Lumbago with sciatica, left side: Secondary | ICD-10-CM | POA: Diagnosis not present

## 2019-12-05 DIAGNOSIS — M25562 Pain in left knee: Secondary | ICD-10-CM | POA: Diagnosis not present

## 2019-12-05 DIAGNOSIS — E119 Type 2 diabetes mellitus without complications: Secondary | ICD-10-CM | POA: Diagnosis not present

## 2019-12-08 DIAGNOSIS — M25562 Pain in left knee: Secondary | ICD-10-CM | POA: Diagnosis not present

## 2019-12-08 DIAGNOSIS — M4720 Other spondylosis with radiculopathy, site unspecified: Secondary | ICD-10-CM | POA: Diagnosis not present

## 2019-12-12 ENCOUNTER — Other Ambulatory Visit: Payer: Self-pay | Admitting: Cardiology

## 2019-12-12 DIAGNOSIS — I10 Essential (primary) hypertension: Secondary | ICD-10-CM

## 2019-12-12 DIAGNOSIS — L84 Corns and callosities: Secondary | ICD-10-CM | POA: Diagnosis not present

## 2019-12-12 DIAGNOSIS — E1151 Type 2 diabetes mellitus with diabetic peripheral angiopathy without gangrene: Secondary | ICD-10-CM | POA: Diagnosis not present

## 2019-12-12 DIAGNOSIS — I739 Peripheral vascular disease, unspecified: Secondary | ICD-10-CM | POA: Diagnosis not present

## 2019-12-12 DIAGNOSIS — L603 Nail dystrophy: Secondary | ICD-10-CM | POA: Diagnosis not present

## 2020-01-25 ENCOUNTER — Other Ambulatory Visit: Payer: Self-pay | Admitting: Cardiology

## 2020-02-24 ENCOUNTER — Other Ambulatory Visit: Payer: Self-pay | Admitting: Cardiology

## 2020-02-24 DIAGNOSIS — I428 Other cardiomyopathies: Secondary | ICD-10-CM

## 2020-04-24 ENCOUNTER — Other Ambulatory Visit: Payer: Self-pay | Admitting: Cardiology

## 2020-11-08 ENCOUNTER — Other Ambulatory Visit: Payer: Self-pay | Admitting: Cardiology

## 2020-11-08 DIAGNOSIS — I428 Other cardiomyopathies: Secondary | ICD-10-CM

## 2020-11-12 ENCOUNTER — Ambulatory Visit: Payer: Medicare HMO | Admitting: Cardiology

## 2020-11-27 ENCOUNTER — Other Ambulatory Visit: Payer: Self-pay | Admitting: Cardiology

## 2020-12-17 ENCOUNTER — Other Ambulatory Visit: Payer: Self-pay

## 2020-12-17 ENCOUNTER — Encounter: Payer: Self-pay | Admitting: Cardiology

## 2020-12-17 ENCOUNTER — Ambulatory Visit: Payer: Medicare HMO | Admitting: Cardiology

## 2020-12-17 VITALS — BP 127/74 | HR 83 | Temp 97.9°F | Resp 17 | Ht 66.5 in | Wt 233.6 lb

## 2020-12-17 DIAGNOSIS — I10 Essential (primary) hypertension: Secondary | ICD-10-CM

## 2020-12-17 DIAGNOSIS — E7800 Pure hypercholesterolemia, unspecified: Secondary | ICD-10-CM

## 2020-12-17 DIAGNOSIS — I493 Ventricular premature depolarization: Secondary | ICD-10-CM

## 2020-12-17 DIAGNOSIS — E1165 Type 2 diabetes mellitus with hyperglycemia: Secondary | ICD-10-CM

## 2020-12-17 MED ORDER — EMPAGLIFLOZIN 25 MG PO TABS: 25.0000 mg | ORAL_TABLET | Freq: Every day | ORAL | 0 refills | Status: DC

## 2020-12-17 NOTE — Progress Notes (Signed)
Primary Physician/Referring:  Bernerd Limbo, MD  Patient ID: Scott Bass, male    DOB: April 03, 1951, 70 y.o.   MRN: [id number]  Chief Complaint  Patient presents with   Cardiomyopathy   Hypertension    1 year   HPI:    Scott Bass  is a 70 y.o. 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  However since being on the metoprolol, LVEF has normalized in 2021.  No further PVCs.  He now presents for annual visit.   Patient reports that he is doing well. He denies any chest pain. Has chronic dyspnea on exertion that is unchanged and minimal leg edema.  He continues to have palpitations rarely  Past Medical History:  Diagnosis Date   Arthritis    "lower back" (12/18/2017)   GERD (gastroesophageal reflux disease)    Glaucoma, both eyes    High cholesterol    Hypertension    Hypothyroidism    Irregular heartbeat    Legally blind    "both eyes; can see some out of left eye"   OSA (obstructive sleep apnea)    Thyroid disease    Type II diabetes mellitus ([place])    Vitamin B12 deficiency    Past Surgical History:  Procedure Laterality Date   LEFT HEART CATH AND CORONARY ANGIOGRAPHY N/A 12/18/2017   Procedure: LEFT HEART CATH AND CORONARY ANGIOGRAPHY;  Surgeon: Nigel Mormon, MD;  Location: [facility];  Service: Cardiovascular;  Laterality: N/A;   NOSE SURGERY     "was crooked; they straightened it out" (12/18/2017)   Family History  Problem Relation Age of Onset   Diabetes Mellitus II Mother 23   Diabetes Mellitus II Sister    Breast cancer Sister    Heart failure Sister 104    Social History   Tobacco Use   Smoking status: Never   Smokeless tobacco: Never  Substance Use Topics   Alcohol use: Yes    Comment: occ   Marital Status: Married  ROS  Review of Systems   Constitutional: Positive for weight gain.  Eyes:  Positive for vision loss in left eye and vision loss in right eye.  Cardiovascular:  Positive for dyspnea on exertion (mild and stable) and palpitations (rare). Negative for chest pain and leg swelling.  Gastrointestinal:  Negative for melena.  Objective  Blood pressure 127/74, pulse 83, temperature 97.9 F (36.6 C), temperature source Temporal, resp. rate 17, height 5' 6.5" (1.689 m), weight 233 lb 9.6 oz (106 kg), SpO2 100 %.  Vitals with BMI 12/17/2020 11/13/2019 09/10/2019  Height 5' 6.5" 5' 6.5" 5' 6.5"  Weight 233 lbs 10 oz 234 lbs 234 lbs 5 oz  BMI 37.14 29.19 16.60  Systolic 600 459 977  Diastolic 74 73 82  Pulse 83 74 84     Physical Exam Constitutional:      General: He is not in acute distress.    Appearance: He is well-developed.     Comments: Moderately obese   Neck:     Vascular: No carotid bruit or JVD.  Cardiovascular:     Rate and Rhythm: Normal rate and regular rhythm.     Pulses: Normal pulses and intact distal pulses.     Heart sounds: No murmur heard.   No gallop.  Pulmonary:     Effort: Pulmonary  effort is normal. No accessory muscle usage or respiratory distress.     Breath sounds: Normal breath sounds.  Abdominal:     General: Bowel sounds are normal.     Palpations: Abdomen is soft.  Musculoskeletal:        General: Normal range of motion.     Right lower leg: No edema.     Left lower leg: No edema.   Laboratory examination:   External labs:    Labs 05/04/2020:  TSH normal at 3.120.  A1c 9.2%.  Hb 11.2/HCT 35.5, platelets 305.  Total cholesterol 204, triglycerides 225, HDL 42, LDL 122.  Serum glucose 3 1 6  mg, BUN 15, creatinine 1.12, EGFR 67/77 mL, potassium 4.3, CMP otherwise normal.  03/20/2019: Glucose 97, BUN 14, Creatinine, Serum 1.02, eGFR If African American 88, BUN/Creatinine Ratio 14, Sodium 139, Potassium 4.2, Alkaline Phosphatase 65, AST 30, ALT 23, Rest of CMP normal.  Lipids:  Cholesterol, Total 159, Triglycerides 69, HDL 4, VLDL 13, LDL 97.  HBA1C 6.6%.  TSH 3.120  Vitamin B-12 253   09/10/2017: Hematocrit 32.7, WBC 7.0, RBC 3.92, hemoglobin 10.7, platelets 314, MCV 83.5, RDW 18.2. Rest of CBC normal  Medications and allergies   Allergies  Allergen Reactions   Hctz [Hydrochlorothiazide] Other (See Comments)    Pancreatitis      Current Outpatient Medications  Medication Instructions   amLODipine (NORVASC) 5 MG tablet TAKE 1 TABLET(5 MG) BY MOUTH DAILY   aspirin 81 mg, Oral, Daily   brimonidine (ALPHAGAN) 0.2 % ophthalmic solution 1 drop, Both Eyes, 3 times daily   hydrALAZINE (APRESOLINE) 50 MG tablet TAKE 1 TABLET(50 MG) BY MOUTH THREE TIMES DAILY   latanoprost (XALATAN) 0.005 % ophthalmic solution 1 drop, Both Eyes, Daily at bedtime   levothyroxine (SYNTHROID) 150 mcg, Oral, Daily   losartan (COZAAR) 100 MG tablet 1 tablet, Oral, Daily   metFORMIN (GLUCOPHAGE) 500 mg, Oral, 2 times daily with meals   Metoprolol Tartrate 75 mg, Oral, 2 times daily   Rosuvastatin Calcium 20 mg, Oral, Daily   spironolactone (ALDACTONE) 25 MG tablet TAKE 1 TABLET BY MOUTH DAILY IN THE MORNING   Radiology:   No results found.  Cardiac Studies:   Home sleep study 07/24/2016: Severe obstructive sleep apnea with an AHI of 32.2 and SA 02 low of 75%.  Lexiscan myoview stress test 11/30/2017: 1.The resting electrocardiogram demonstrated normal sinus rhythm, normal resting conduction and frequent PVC in bigeminal pattern. Stress EKG is non diagnostic due to pharmacologic stress with Lexiscan. PVC persisted. Stress symptoms included chest pressure. 2. Left ventricular cavity is noted to be enlarged on the rest and stress studies at 172 mL. This is an abnormal myocardial perfusion imaging study demonstrating a mixture of scar plus ischemia in the basal inferior, mid inferior and apical inferior myocardial wall(s). Gated SPECT imaging demonstrates hypokinesis of the basal  inferior, mid inferior and apical inferior myocardial wall(s). The left ventricular ejection fraction was calculated to be 41%. 3. This is an intermediate risk study, clinical correlation recommended.   Coronary angiogram 12/18/17: Codominant circulation with no significant coronary artery disease. Mildly elevated LVEDP.   Frequent symptomatic PVC's of nonischemic etiology. Consider EP referral for further management given PVC burden 16% on high dose beta blocker (metoprolol tartarate 100-50 mg bid) and mildly reduced LVEF 49%  Echocardiogram 11/22/2018: Left ventricle cavity is normal in size. Moderate concentric hypertrophy of the left ventricle. Mildly depressed LV systolic function with EF 40-45%. Mild global hypokinesis.  Indeterminate diastolic  filling pattern.  Left atrial cavity is mildly dilated. Trileaflet aortic valve. Trace aortic regurgitation. Mild (Grade I) mitral regurgitation. Mild pulmonic regurgitation. IVC is dilated with a respiratory response of >50%. This suggests elevated right heart pressure. Compared to previous study on 11/20/2017, EF is mildly reduced.   48 hour holter 11/22/2018: Normal sinus rhythm with frequent PVC's (19.2%) including occasional PVC couplets and occasional episodes of PVC's in bigeminal and trigeminal pattern. No symptoms reported. No A fib.   Echocardiogram 11/05/2019: 1. Left ventricle cavity is normal in size. Moderate concentric hypertrophy of the left ventricle. Normal global wall motion. Normal LV systolic function with EF 55-60%. Doppler evidence of grade I (impaired) diastolic dysfunction, normal LAP. 2. No significant valvular abnormalities. Normal right atrial pressure. 3. Compared to previous study on 11/22/2018, LVEF has improved from 40-45% to  55-60%.  EKG  EKG 12/09/2020: Normal sinus rhythm at rate of 79 bpm, borderline left atrial abnormality, normal axis.  LVH.  Nonspecific diffuse T abnormality, consider LVH with repolarization  abnormality.  No change from 06/03/2019, however T waves are upright on 11/13/2019.   Assessment     ICD-10-CM   1. Essential hypertension  I10 EKG 12-Lead    2. Frequent PVCs  I49.3     3. Hypercholesteremia  E78.00     4. Type 2 diabetes mellitus with hyperglycemia, without long-term current use of insulin (HCC)  E11.65        No orders of the defined types were placed in this encounter.   Medications Discontinued During This Encounter  Medication Reason   spironolactone (ALDACTONE) 25 MG tablet Error    Recommendations:   Scott Bass  is a 70 y.o.  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  However since being on the metoprolol, LVEF has normalized in 2021.  No further PVCs.  He now presents for annual visit.  From cardiac standpoint he remains stable without any clinical evidence of heart failure, blood pressure is also well controlled.  I am very concerned about his uncontrolled diabetes and potential for progression of complications from the same.  I reviewed his external labs, patient has gained weight and has been eating out mostly and also his lipids are now uncontrolled.  I will start the patient on Jardiance 25 mg daily, 3 weeks worth of samples and 2 weeks worth of coupon given to the patient and have advised him strongly to make an appointment to see Dr. Netta Corrigan for management of his diabetes.  Otherwise stable from cardiac standpoint, I do not need to see him back unless cardiac issues were to arise.    Scott Prows, MD, [facility] 12/17/2020, 11:05 AM Office: [phone]

## 2020-12-28 ENCOUNTER — Other Ambulatory Visit: Payer: Self-pay | Admitting: Cardiology

## 2020-12-28 DIAGNOSIS — I10 Essential (primary) hypertension: Secondary | ICD-10-CM

## 2021-01-31 ENCOUNTER — Other Ambulatory Visit: Payer: Self-pay | Admitting: Cardiology

## 2021-12-09 ENCOUNTER — Other Ambulatory Visit: Payer: Self-pay | Admitting: Cardiology

## 2021-12-09 DIAGNOSIS — I428 Other cardiomyopathies: Secondary | ICD-10-CM

## 2022-05-11 ENCOUNTER — Ambulatory Visit: Payer: HMO | Admitting: Gastroenterology

## 2022-06-15 ENCOUNTER — Institutional Professional Consult (permissible substitution) (HOSPITAL_BASED_OUTPATIENT_CLINIC_OR_DEPARTMENT_OTHER): Payer: HMO | Admitting: Pulmonary Disease

## 2022-09-01 ENCOUNTER — Encounter: Payer: Self-pay | Admitting: Gastroenterology

## 2022-09-08 ENCOUNTER — Emergency Department (HOSPITAL_COMMUNITY)
Admission: EM | Admit: 2022-09-08 | Discharge: 2022-09-08 | Disposition: A | Discharge status: Home / Self Care | Payer: Medicare PPO | Attending: Emergency Medicine | Admitting: Emergency Medicine

## 2022-09-08 ENCOUNTER — Emergency Department (HOSPITAL_COMMUNITY): Payer: Medicare PPO

## 2022-09-08 ENCOUNTER — Encounter (HOSPITAL_COMMUNITY): Payer: Self-pay | Admitting: Emergency Medicine

## 2022-09-08 ENCOUNTER — Other Ambulatory Visit: Payer: Self-pay

## 2022-09-08 DIAGNOSIS — E039 Hypothyroidism, unspecified: Secondary | ICD-10-CM | POA: Diagnosis not present

## 2022-09-08 DIAGNOSIS — D649 Anemia, unspecified: Secondary | ICD-10-CM | POA: Diagnosis not present

## 2022-09-08 DIAGNOSIS — R0602 Shortness of breath: Secondary | ICD-10-CM | POA: Diagnosis not present

## 2022-09-08 DIAGNOSIS — R110 Nausea: Secondary | ICD-10-CM | POA: Diagnosis not present

## 2022-09-08 DIAGNOSIS — R7989 Other specified abnormal findings of blood chemistry: Secondary | ICD-10-CM | POA: Diagnosis not present

## 2022-09-08 DIAGNOSIS — Z79899 Other long term (current) drug therapy: Secondary | ICD-10-CM | POA: Diagnosis not present

## 2022-09-08 DIAGNOSIS — R109 Unspecified abdominal pain: Secondary | ICD-10-CM | POA: Diagnosis not present

## 2022-09-08 DIAGNOSIS — R079 Chest pain, unspecified: Secondary | ICD-10-CM | POA: Diagnosis present

## 2022-09-08 DIAGNOSIS — Z7982 Long term (current) use of aspirin: Secondary | ICD-10-CM | POA: Insufficient documentation

## 2022-09-08 HISTORY — DX: Blindness, one eye, low vision other eye, unspecified eyes: H54.10

## 2022-09-08 LAB — CBC
HCT: 22.9 % — ABNORMAL LOW (ref 39.0–52.0)
Hemoglobin: 7.2 g/dL — ABNORMAL LOW (ref 13.0–17.0)
MCH: 28 pg (ref 26.0–34.0)
MCHC: 31.4 g/dL (ref 30.0–36.0)
MCV: 89.1 fL (ref 80.0–100.0)
Platelets: 234 10*3/uL (ref 150–400)
RBC: 2.57 MIL/uL — ABNORMAL LOW (ref 4.22–5.81)
RDW: 18.5 % — ABNORMAL HIGH (ref 11.5–15.5)
WBC: 6 10*3/uL (ref 4.0–10.5)
nRBC: 0.3 % — ABNORMAL HIGH (ref 0.0–0.2)

## 2022-09-08 LAB — PROTIME-INR
INR: 1.1 (ref 0.8–1.2)
Prothrombin Time: 14.4 seconds (ref 11.4–15.2)

## 2022-09-08 LAB — BASIC METABOLIC PANEL
Anion gap: 10 (ref 5–15)
BUN: 13 mg/dL (ref 8–23)
CO2: 21 mmol/L — ABNORMAL LOW (ref 22–32)
Calcium: 11 mg/dL — ABNORMAL HIGH (ref 8.9–10.3)
Chloride: 105 mmol/L (ref 98–111)
Creatinine, Ser: 1.46 mg/dL — ABNORMAL HIGH (ref 0.61–1.24)
GFR, Estimated: 51 mL/min — ABNORMAL LOW (ref >60)
Glucose, Bld: 196 mg/dL — ABNORMAL HIGH (ref 70–99)
Potassium: 3.9 mmol/L (ref 3.5–5.1)
Sodium: 136 mmol/L (ref 135–145)

## 2022-09-08 LAB — ABO/RH: ABO/RH(D): B POS

## 2022-09-08 LAB — TYPE AND SCREEN
ABO/RH(D): B POS
Antibody Screen: NEGATIVE
Unit division: 0

## 2022-09-08 LAB — BPAM RBC
Blood Product Expiration Date: 202405312359
ISSUE DATE / TIME: 202405031426

## 2022-09-08 LAB — TROPONIN I (HIGH SENSITIVITY)
Troponin I (High Sensitivity): 17 ng/L (ref <18)
Troponin I (High Sensitivity): 19 ng/L — ABNORMAL HIGH (ref <18)

## 2022-09-08 LAB — MAGNESIUM: Magnesium: 1.2 mg/dL — ABNORMAL LOW (ref 1.7–2.4)

## 2022-09-08 LAB — PREPARE RBC (CROSSMATCH)

## 2022-09-08 LAB — POC OCCULT BLOOD, ED: Fecal Occult Bld: NEGATIVE

## 2022-09-08 MED ORDER — SODIUM CHLORIDE 0.9% IV SOLUTION: Freq: Once | INTRAVENOUS | Status: DC

## 2022-09-08 MED ORDER — SODIUM CHLORIDE 0.9 % IV BOLUS
500.0000 mL | Freq: Once | INTRAVENOUS | Status: AC
  Administered 2022-09-08: 500 mL via INTRAVENOUS

## 2022-09-08 MED ORDER — MAGNESIUM SULFATE 2 GM/50ML IV SOLN
2.0000 g | Freq: Once | INTRAVENOUS | Status: AC
  Administered 2022-09-08: 2 g via INTRAVENOUS
  Filled 2022-09-08: qty 50

## 2022-09-08 NOTE — ED Triage Notes (Signed)
Pt. Stated, They called me to tell me to come here cause my hemoglobin was low. [facility] called me to tell me to come.

## 2022-09-08 NOTE — ED Notes (Addendum)
RN to bedside. MD notified this RN that he saw a bug on the patient. This RN captured a bedbug and notified the charge RN. Pt admitted to this RN that he has been dealing with the situation at home. RN educated patient to notify staff the next time when he first arrives to the ED d/t decon procedures that need to take place. RN spoke to charge RN d/t patient having a visitor currently at the bedside and charge RN stated that the visitor needs to leave, shower at home, and then may return later (at which point the patient will have been decontaminated as well). Pt is not wanting his significant other to leave and is stating he will have to leave himself as well to get her situated and then return. Pt is thinking over the situation at this time. MD Charm Barges notified, currently awaiting cbc results to confirm patient's H/H levels.   Pt currently refusing more blood work at this time.

## 2022-09-08 NOTE — ED Notes (Signed)
Patient showered and changed into a clean gown and hospital socks. Clothing double bagged and placed at bedside.

## 2022-09-08 NOTE — ED Provider Notes (Signed)
[place] EMERGENCY DEPARTMENT AT [facility] Provider Note   CSN: [id number] Arrival date & time: 09/08/22  1018     History {Add pertinent medical, surgical, social history, OB history to HPI:1} Chief Complaint  Patient presents with   abnormal labs   Chest Pain    GAVRIL RADICH is a 72 y.o. male.  He has a history of blindness, irregular heartbeat, cardiomyopathy, hypothyroidism.  He takes a daily aspirin.  He is here with a complaint of having some blood work done by his PCP and was called that his hemoglobin is low.  He has not seen any bleeding.  He has had some dark stools on and off but he takes iron and thought it might be related to that.  He does have some chest discomfort and shortness of breath especially with exertion.  Also complains of abdominal pain.  Denies any vomiting or diarrhea but has been nauseous.  No fevers or chills.  The history is provided by the patient.  Chest Pain Pain location:  Substernal area Pain quality: pressure   Pain radiates to:  Does not radiate Pain severity:  Mild Onset quality:  Gradual Timing:  Intermittent Progression:  Unchanged Relieved by:  None tried Worsened by:  Nothing Ineffective treatments:  None tried Associated symptoms: abdominal pain, nausea and shortness of breath   Associated symptoms: no cough, no diaphoresis, no dysphagia, no fever and no vomiting   Risk factors: high cholesterol and hypertension   Risk factors: no smoking        Home Medications Prior to Admission medications   Medication Sig Start Date End Date Taking? Authorizing Provider  amLODipine (NORVASC) 5 MG tablet TAKE 1 TABLET(5 MG) BY MOUTH DAILY 12/28/20   Yates Decamp, MD  aspirin 81 MG tablet Take 81 mg by mouth daily.    [provider]  brimonidine (ALPHAGAN) 0.2 % ophthalmic solution Place 1 drop into both eyes 3 (three) times daily.  01/17/15   [provider]  empagliflozin (JARDIANCE) 25 MG TABS tablet Take 1  tablet (25 mg total) by mouth daily before breakfast. 12/17/20   Yates Decamp, MD  hydrALAZINE (APRESOLINE) 50 MG tablet TAKE 1 TABLET(50 MG) BY MOUTH THREE TIMES DAILY 12/09/21   Yates Decamp, MD  latanoprost (XALATAN) 0.005 % ophthalmic solution Place 1 drop into both eyes at bedtime.    [provider]  levothyroxine (SYNTHROID, LEVOTHROID) 150 MCG tablet Take 150 mcg by mouth daily. 12/10/14   [provider]  losartan (COZAAR) 100 MG tablet Take 1 tablet by mouth daily. 09/14/20   [provider]  metFORMIN (GLUCOPHAGE) 1000 MG tablet Take 500 mg by mouth 2 (two) times daily with a meal.     [provider]  Metoprolol Tartrate 75 MG TABS Take 75 mg by mouth 2 (two) times daily.     [provider]  Rosuvastatin Calcium 20 MG CPSP Take 20 mg by mouth daily.     [provider]  spironolactone (ALDACTONE) 25 MG tablet TAKE 1 TABLET BY MOUTH DAILY IN THE MORNING 01/31/21   Yates Decamp, MD      Allergies    Hctz [hydrochlorothiazide]    Review of Systems   Review of Systems  Constitutional:  Negative for diaphoresis and fever.  HENT:  Negative for trouble swallowing.   Eyes:  Positive for visual disturbance.  Respiratory:  Positive for shortness of breath. Negative for cough.   Cardiovascular:  Positive for chest pain.  Gastrointestinal:  Positive for abdominal pain and nausea. Negative for vomiting.    Physical Exam Updated Vital Signs BP (!) 156/86 (BP Location: Right Arm)   Pulse (!) 131   Temp 99.2 F (37.3 C) (Oral)   Resp 20   Ht 5' 6.5" (1.689 m)   Wt 96.2 kg   SpO2 96%   BMI 33.71 kg/m  Physical Exam Vitals and nursing note reviewed.  Constitutional:      General: He is not in acute distress.    Appearance: He is well-developed.  HENT:     Head: Normocephalic and atraumatic.  Eyes:     Conjunctiva/sclera: Conjunctivae normal.  Cardiovascular:     Rate and Rhythm: Tachycardia present. Rhythm irregular.     Heart sounds:  Normal heart sounds. No murmur heard. Pulmonary:     Effort: Pulmonary effort is normal. No respiratory distress.     Breath sounds: Normal breath sounds.  Abdominal:     Palpations: Abdomen is soft.     Tenderness: There is no abdominal tenderness.  Musculoskeletal:        General: No swelling. Normal range of motion.     Cervical back: Neck supple.     Right lower leg: No tenderness. No edema.     Left lower leg: No tenderness. No edema.  Skin:    General: Skin is warm and dry.     Capillary Refill: Capillary refill takes less than 2 seconds.  Neurological:     General: No focal deficit present.     Mental Status: He is alert.     ED Results / Procedures / Treatments   Labs (all labs ordered are listed, but only abnormal results are displayed) Labs Reviewed  BASIC METABOLIC PANEL  CBC  TROPONIN I (HIGH SENSITIVITY)    EKG EKG Interpretation  Date/Time:  Friday Sep 08 2022 10:38:09 EDT Ventricular Rate:  116 PR Interval:  200 QRS Duration: 106 QT Interval:  342 QTC Calculation: 475 R Axis:   -18 Text Interpretation: Sinus tachycardia with Premature atrial complexes Left ventricular hypertrophy with repolarization abnormality ( R in aVL ) Abnormal ECG When compared with ECG of 22-May-2006 10:51, PREVIOUS ECG IS PRESENT Rate faster no STEMI Confirmed by Glynn Octave [phone]) on 09/08/2022 11:11:29 AM  Radiology DG Chest 2 View  Result Date: 09/08/2022 CLINICAL DATA:  Left-sided chest pain. EXAM: CHEST - 2 VIEW COMPARISON:  Chest radiograph 02/17/2015 FINDINGS: The heart is mildly enlarged. The upper mediastinal contours are normal. There is ill-defined opacity in the right upper lobe measuring up to approximately 2.5 cm, new since the prior study. There is no other focal airspace opacity. There is no pulmonary edema. There is no pleural effusion or pneumothorax There is no acute osseous abnormality. IMPRESSION: 1. 2.5 cm opacity in the right upper lobe may be artifactual;  however, recommend CT of the chest to exclude a parenchymal mass. 2. Mild cardiomegaly. Electronically Signed   By: Lesia Hausen M.D.   On: 09/08/2022 11:18    Procedures Procedures  {Document cardiac monitor, telemetry assessment procedure when appropriate:1}  Medications Ordered in ED Medications  sodium chloride 0.9 % bolus 500 mL (has no administration in time range)    ED Course/ Medical Decision Making/ A&P Clinical Course as of 09/08/22 1127  Fri Sep 08, 2022  1118 Chest x-ray interpreted by me as no acute infiltrate.  Does have possible mass right upper lobe awaiting radiology reading. [MB]    Clinical Course User Index [MB]  Terrilee Files, MD   {   Click here for ABCD2, HEART and other calculatorsREFRESH Note before signing :1}                          Medical Decision Making Amount and/or Complexity of Data Reviewed Labs: ordered. Radiology: ordered.   This patient complains of ***; this involves an extensive number of treatment Options and is a complaint that carries with it a high risk of complications and morbidity. The differential includes ***  I ordered, reviewed and interpreted labs, which included *** I ordered medication *** and reviewed PMP when indicated. I ordered imaging studies which included *** and I independently    visualized and interpreted imaging which showed *** Additional history obtained from *** Previous records obtained and reviewed *** I consulted *** and discussed lab and imaging findings and discussed disposition.  Cardiac monitoring reviewed, *** Social determinants considered, *** Critical Interventions: ***  After the interventions stated above, I reevaluated the patient and found *** Admission and further testing considered, ***   {Document critical care time when appropriate:1} {Document review of labs and clinical decision tools ie heart score, Chads2Vasc2 etc:1}  {Document your independent review of radiology images,  and any outside records:1} {Document your discussion with family members, caretakers, and with consultants:1} {Document social determinants of health affecting pt's care:1} {Document your decision making why or why not admission, treatments were needed:1} Final Clinical Impression(s) / ED Diagnoses Final diagnoses:  None    Rx / DC Orders ED Discharge Orders     None

## 2022-09-08 NOTE — ED Notes (Signed)
Patient updated on his H/H results and educated that staying and getting a work-up is highly recommended. Pt is agreeable and patient's significant other was able to find a ride home and is leaving now. Will start the decontamination process now and then continue with patient's medical care. MD notified

## 2022-09-08 NOTE — ED Notes (Signed)
Pt given paper scrubs, sandals and sent home with his belongings.

## 2022-09-08 NOTE — ED Provider Notes (Signed)
Blood pressure (!) 171/84, pulse 95, temperature 98.2 F (36.8 C), temperature source Oral, resp. rate 18, height 5' 6.5" (1.689 m), weight 96.2 kg, SpO2 100 %.  Assuming care from Dr. Charm Barges.  In short, Scott Bass is a 72 y.o. male with a chief complaint of abnormal labs and Chest Pain .  Refer to the original H&P for additional details.  The current plan of care is to follow up after PRBC transfusion and d/c.  PRBC completed. Plan for d/c.     Maia Plan, MD 09/10/22 1126

## 2022-09-08 NOTE — Discharge Instructions (Signed)
You were seen in the emergency department for low blood count shortness of breath chest pain.  Your red blood cell count was low and you were given a transfusion of 1 unit of red blood cells.  There was no evidence of heart attack or pneumonia.  Please continue regular medications and follow-up with your primary care doctor.  Return to the emergency department if any worsening or concerning symptoms.

## 2022-09-09 LAB — BPAM RBC: Unit Type and Rh: 7300

## 2022-09-09 LAB — TYPE AND SCREEN

## 2022-09-22 ENCOUNTER — Ambulatory Visit
Admission: RE | Admit: 2022-09-22 | Discharge: 2022-09-22 | Disposition: A | Discharge status: Home / Self Care | Payer: Medicare PPO | Source: Ambulatory Visit | Attending: Geriatric Medicine | Admitting: Geriatric Medicine

## 2022-09-22 ENCOUNTER — Other Ambulatory Visit: Payer: Self-pay | Admitting: Geriatric Medicine

## 2022-09-22 DIAGNOSIS — M25552 Pain in left hip: Secondary | ICD-10-CM

## 2022-09-25 ENCOUNTER — Other Ambulatory Visit: Payer: Self-pay

## 2022-09-25 ENCOUNTER — Emergency Department (HOSPITAL_COMMUNITY): Payer: Medicare PPO

## 2022-09-25 ENCOUNTER — Encounter (HOSPITAL_COMMUNITY): Payer: Self-pay

## 2022-09-25 ENCOUNTER — Inpatient Hospital Stay (HOSPITAL_COMMUNITY)
Admission: EM | Admit: 2022-09-25 | Discharge: 2022-10-01 | DRG: 841 | Disposition: A | Discharge status: Home Health | Payer: Medicare PPO | Attending: Internal Medicine | Admitting: Internal Medicine

## 2022-09-25 ENCOUNTER — Ambulatory Visit: Payer: Medicare PPO | Admitting: Physician Assistant

## 2022-09-25 DIAGNOSIS — H409 Unspecified glaucoma: Secondary | ICD-10-CM | POA: Diagnosis present

## 2022-09-25 DIAGNOSIS — I129 Hypertensive chronic kidney disease with stage 1 through stage 4 chronic kidney disease, or unspecified chronic kidney disease: Secondary | ICD-10-CM | POA: Diagnosis present

## 2022-09-25 DIAGNOSIS — D649 Anemia, unspecified: Secondary | ICD-10-CM | POA: Diagnosis not present

## 2022-09-25 DIAGNOSIS — K219 Gastro-esophageal reflux disease without esophagitis: Secondary | ICD-10-CM | POA: Diagnosis present

## 2022-09-25 DIAGNOSIS — N179 Acute kidney failure, unspecified: Principal | ICD-10-CM | POA: Diagnosis present

## 2022-09-25 DIAGNOSIS — E039 Hypothyroidism, unspecified: Secondary | ICD-10-CM | POA: Diagnosis present

## 2022-09-25 DIAGNOSIS — Z833 Family history of diabetes mellitus: Secondary | ICD-10-CM

## 2022-09-25 DIAGNOSIS — Z79890 Hormone replacement therapy (postmenopausal): Secondary | ICD-10-CM | POA: Diagnosis not present

## 2022-09-25 DIAGNOSIS — E669 Obesity, unspecified: Secondary | ICD-10-CM | POA: Diagnosis present

## 2022-09-25 DIAGNOSIS — E872 Acidosis, unspecified: Secondary | ICD-10-CM | POA: Diagnosis present

## 2022-09-25 DIAGNOSIS — Z888 Allergy status to other drugs, medicaments and biological substances status: Secondary | ICD-10-CM | POA: Diagnosis not present

## 2022-09-25 DIAGNOSIS — Z7984 Long term (current) use of oral hypoglycemic drugs: Secondary | ICD-10-CM

## 2022-09-25 DIAGNOSIS — D509 Iron deficiency anemia, unspecified: Secondary | ICD-10-CM | POA: Diagnosis present

## 2022-09-25 DIAGNOSIS — H548 Legal blindness, as defined in USA: Secondary | ICD-10-CM | POA: Diagnosis present

## 2022-09-25 DIAGNOSIS — Z79899 Other long term (current) drug therapy: Secondary | ICD-10-CM

## 2022-09-25 DIAGNOSIS — E118 Type 2 diabetes mellitus with unspecified complications: Secondary | ICD-10-CM

## 2022-09-25 DIAGNOSIS — G8929 Other chronic pain: Secondary | ICD-10-CM | POA: Diagnosis present

## 2022-09-25 DIAGNOSIS — E7800 Pure hypercholesterolemia, unspecified: Secondary | ICD-10-CM | POA: Diagnosis present

## 2022-09-25 DIAGNOSIS — Z8249 Family history of ischemic heart disease and other diseases of the circulatory system: Secondary | ICD-10-CM

## 2022-09-25 DIAGNOSIS — Z7982 Long term (current) use of aspirin: Secondary | ICD-10-CM

## 2022-09-25 DIAGNOSIS — Z803 Family history of malignant neoplasm of breast: Secondary | ICD-10-CM

## 2022-09-25 DIAGNOSIS — R079 Chest pain, unspecified: Secondary | ICD-10-CM | POA: Diagnosis not present

## 2022-09-25 DIAGNOSIS — C9000 Multiple myeloma not having achieved remission: Principal | ICD-10-CM | POA: Diagnosis present

## 2022-09-25 DIAGNOSIS — E1122 Type 2 diabetes mellitus with diabetic chronic kidney disease: Secondary | ICD-10-CM | POA: Diagnosis present

## 2022-09-25 DIAGNOSIS — E860 Dehydration: Secondary | ICD-10-CM | POA: Diagnosis present

## 2022-09-25 DIAGNOSIS — E876 Hypokalemia: Secondary | ICD-10-CM | POA: Diagnosis present

## 2022-09-25 DIAGNOSIS — N1832 Chronic kidney disease, stage 3b: Secondary | ICD-10-CM | POA: Diagnosis present

## 2022-09-25 DIAGNOSIS — Z6832 Body mass index (BMI) 32.0-32.9, adult: Secondary | ICD-10-CM | POA: Diagnosis not present

## 2022-09-25 DIAGNOSIS — G4733 Obstructive sleep apnea (adult) (pediatric): Secondary | ICD-10-CM | POA: Diagnosis present

## 2022-09-25 DIAGNOSIS — I10 Essential (primary) hypertension: Secondary | ICD-10-CM | POA: Diagnosis not present

## 2022-09-25 LAB — BASIC METABOLIC PANEL
Anion gap: 11 (ref 5–15)
Anion gap: 11 (ref 5–15)
BUN: 41 mg/dL — ABNORMAL HIGH (ref 8–23)
BUN: 37 mg/dL — ABNORMAL HIGH (ref 8–23)
CO2: 21 mmol/L — ABNORMAL LOW (ref 22–32)
CO2: 20 mmol/L — ABNORMAL LOW (ref 22–32)
Calcium: >15 mg/dL (ref 8.9–10.3)
Calcium: 14.2 mg/dL (ref 8.9–10.3)
Chloride: 105 mmol/L (ref 98–111)
Chloride: 108 mmol/L (ref 98–111)
Creatinine, Ser: 4.14 mg/dL — ABNORMAL HIGH (ref 0.61–1.24)
Creatinine, Ser: 3.42 mg/dL — ABNORMAL HIGH (ref 0.61–1.24)
GFR, Estimated: 15 mL/min — ABNORMAL LOW (ref >60)
GFR, Estimated: 18 mL/min — ABNORMAL LOW (ref >60)
Glucose, Bld: 148 mg/dL — ABNORMAL HIGH (ref 70–99)
Glucose, Bld: 129 mg/dL — ABNORMAL HIGH (ref 70–99)
Potassium: 3.4 mmol/L — ABNORMAL LOW (ref 3.5–5.1)
Potassium: 3.9 mmol/L (ref 3.5–5.1)
Sodium: 137 mmol/L (ref 135–145)
Sodium: 139 mmol/L (ref 135–145)

## 2022-09-25 LAB — URINALYSIS, ROUTINE W REFLEX MICROSCOPIC
Bilirubin Urine: NEGATIVE
Glucose, UA: NEGATIVE mg/dL
Ketones, ur: NEGATIVE mg/dL
Leukocytes,Ua: NEGATIVE
Nitrite: NEGATIVE
Protein, ur: NEGATIVE mg/dL
Specific Gravity, Urine: 1.005 (ref 1.005–1.030)
pH: 5 (ref 5.0–8.0)

## 2022-09-25 LAB — CBC WITH DIFFERENTIAL/PLATELET
Abs Immature Granulocytes: 0.08 10*3/uL — ABNORMAL HIGH (ref 0.00–0.07)
Basophils Absolute: 0 10*3/uL (ref 0.0–0.1)
Basophils Relative: 1 %
Eosinophils Absolute: 0.1 10*3/uL (ref 0.0–0.5)
Eosinophils Relative: 1 %
HCT: 25.1 % — ABNORMAL LOW (ref 39.0–52.0)
Hemoglobin: 7.8 g/dL — ABNORMAL LOW (ref 13.0–17.0)
Immature Granulocytes: 1 %
Lymphocytes Relative: 38 %
Lymphs Abs: 2.1 10*3/uL (ref 0.7–4.0)
MCH: 27.8 pg (ref 26.0–34.0)
MCHC: 31.1 g/dL (ref 30.0–36.0)
MCV: 89.3 fL (ref 80.0–100.0)
Monocytes Absolute: 0.8 10*3/uL (ref 0.1–1.0)
Monocytes Relative: 14 %
Neutro Abs: 2.6 10*3/uL (ref 1.7–7.7)
Neutrophils Relative %: 45 %
Platelets: 208 10*3/uL (ref 150–400)
RBC: 2.81 MIL/uL — ABNORMAL LOW (ref 4.22–5.81)
RDW: 17.5 % — ABNORMAL HIGH (ref 11.5–15.5)
WBC: 5.6 10*3/uL (ref 4.0–10.5)
nRBC: 0.5 % — ABNORMAL HIGH (ref 0.0–0.2)

## 2022-09-25 LAB — MAGNESIUM: Magnesium: 1.4 mg/dL — ABNORMAL LOW (ref 1.7–2.4)

## 2022-09-25 LAB — CBG MONITORING, ED: Glucose-Capillary: 125 mg/dL — ABNORMAL HIGH (ref 70–99)

## 2022-09-25 LAB — PHOSPHORUS: Phosphorus: >30 mg/dL — ABNORMAL HIGH (ref 2.5–4.6)

## 2022-09-25 LAB — VITAMIN D 25 HYDROXY (VIT D DEFICIENCY, FRACTURES): Vit D, 25-Hydroxy: 54.04 ng/mL (ref 30–100)

## 2022-09-25 LAB — TSH: TSH: 0.619 u[IU]/mL (ref 0.350–4.500)

## 2022-09-25 MED ORDER — SODIUM CHLORIDE 0.9 % IV BOLUS
1000.0000 mL | Freq: Once | INTRAVENOUS | Status: AC
  Administered 2022-09-25: 1000 mL via INTRAVENOUS

## 2022-09-25 MED ORDER — LEVOTHYROXINE SODIUM 75 MCG PO TABS
150.0000 ug | ORAL_TABLET | Freq: Every day | ORAL | Status: DC
  Administered 2022-09-26 – 2022-10-01 (×7): 150 ug via ORAL
  Filled 2022-09-25 (×7): qty 2

## 2022-09-25 MED ORDER — ASPIRIN 81 MG PO TBEC
81.0000 mg | DELAYED_RELEASE_TABLET | Freq: Every day | ORAL | Status: DC
  Administered 2022-09-26 – 2022-10-01 (×6): 81 mg via ORAL
  Filled 2022-09-25 (×6): qty 1

## 2022-09-25 MED ORDER — ROSUVASTATIN CALCIUM 5 MG PO TABS
10.0000 mg | ORAL_TABLET | Freq: Every day | ORAL | Status: DC
  Administered 2022-09-26 – 2022-10-01 (×6): 10 mg via ORAL
  Filled 2022-09-25 (×6): qty 2

## 2022-09-25 MED ORDER — SODIUM CHLORIDE 0.9 % IV SOLN: INTRAVENOUS | Status: DC

## 2022-09-25 MED ORDER — ACETAMINOPHEN 325 MG PO TABS
650.0000 mg | ORAL_TABLET | Freq: Four times a day (QID) | ORAL | Status: DC | PRN
  Administered 2022-09-28 – 2022-09-30 (×3): 650 mg via ORAL
  Filled 2022-09-25 (×2): qty 2

## 2022-09-25 MED ORDER — OXYCODONE HCL 5 MG PO TABS
5.0000 mg | ORAL_TABLET | Freq: Four times a day (QID) | ORAL | Status: AC | PRN
  Administered 2022-09-29 – 2022-09-30 (×2): 5 mg via ORAL
  Filled 2022-09-25 (×2): qty 1

## 2022-09-25 MED ORDER — INSULIN ASPART 100 UNIT/ML IJ SOLN
0.0000 [IU] | Freq: Three times a day (TID) | INTRAMUSCULAR | Status: DC
  Administered 2022-09-26 (×3): 1 [IU] via SUBCUTANEOUS
  Administered 2022-09-29 – 2022-09-30 (×2): 2 [IU] via SUBCUTANEOUS
  Administered 2022-09-30: 1 [IU] via SUBCUTANEOUS
  Administered 2022-09-30: 2 [IU] via SUBCUTANEOUS

## 2022-09-25 MED ORDER — PROCHLORPERAZINE EDISYLATE 10 MG/2ML IJ SOLN
5.0000 mg | Freq: Four times a day (QID) | INTRAMUSCULAR | Status: DC | PRN
  Administered 2022-09-25: 5 mg via INTRAVENOUS
  Filled 2022-09-25: qty 2

## 2022-09-25 MED ORDER — HYDROMORPHONE HCL 1 MG/ML IJ SOLN
0.5000 mg | INTRAMUSCULAR | Status: DC | PRN
  Administered 2022-10-01: 0.5 mg via INTRAVENOUS
  Filled 2022-09-25: qty 0.5

## 2022-09-25 MED ORDER — CALCITONIN (SALMON) 200 UNIT/ML IJ SOLN
4.0000 [IU]/kg | Freq: Two times a day (BID) | INTRAMUSCULAR | Status: AC
  Administered 2022-09-25 – 2022-09-26 (×2): 350 [IU] via SUBCUTANEOUS
  Filled 2022-09-25 (×2): qty 1.75

## 2022-09-25 MED ORDER — MAGNESIUM SULFATE 2 GM/50ML IV SOLN
2.0000 g | Freq: Once | INTRAVENOUS | Status: AC
  Administered 2022-09-25: 2 g via INTRAVENOUS
  Filled 2022-09-25: qty 50

## 2022-09-25 MED ORDER — INSULIN ASPART 100 UNIT/ML IJ SOLN: 0.0000 [IU] | Freq: Every day | INTRAMUSCULAR | Status: DC

## 2022-09-25 MED ORDER — POTASSIUM CHLORIDE 10 MEQ/100ML IV SOLN
10.0000 meq | INTRAVENOUS | Status: AC
  Administered 2022-09-25 (×3): 10 meq via INTRAVENOUS
  Filled 2022-09-25 (×3): qty 100

## 2022-09-25 MED ORDER — HEPARIN SODIUM (PORCINE) 5000 UNIT/ML IJ SOLN
5000.0000 [IU] | Freq: Three times a day (TID) | INTRAMUSCULAR | Status: DC
  Administered 2022-09-25 – 2022-10-01 (×16): 5000 [IU] via SUBCUTANEOUS
  Filled 2022-09-25 (×17): qty 1

## 2022-09-25 MED ORDER — FUROSEMIDE 10 MG/ML IJ SOLN
40.0000 mg | Freq: Once | INTRAMUSCULAR | Status: AC
  Administered 2022-09-25: 40 mg via INTRAVENOUS
  Filled 2022-09-25: qty 4

## 2022-09-25 MED ORDER — POLYETHYLENE GLYCOL 3350 17 G PO PACK: 17.0000 g | PACK | Freq: Every day | ORAL | Status: DC | PRN

## 2022-09-25 NOTE — H&P (Signed)
History and Physical  Scott Bass ZOX:[id number] DOB: 1950/10/08 DOA: 09/25/2022  Referring physician: Dr. Earlene Plater, resident, EDP  PCP: Tracey Harries, MD  Outpatient Specialists: Cardiology Patient coming from: Home.  Chief Complaint: Abnormal lab results.  HPI: Scott Bass is a 72 y.o. male with medical history significant for type 2 diabetes, hypertension, hyperlipidemia, hypothyroidism, obesity, OSA, who presented to [facility] ED from home with complaints of abnormal lab results.  Sent over by his primary care provider.  Endorses generalized body pain including hip pain and chronic back pain.  Also reports nausea and vomiting.  In the ED, workup revealed hypercalcemia greater than 15 for which nephrology was consulted.  Chest x-ray revealed 2.5 cm opacity in the right upper lobe lung and mild cardiomegaly.    The patient received IV fluid hydration with NS 1 L x 2, 1 dose of IV Lasix 40 mg x 1, subcu calcitonin twice daily TRH, hospitalist service, was asked to admit.  ED Course: Temperature 98.2.  BP 141/71, pulse 96, respiration rate 18, O2 saturation 96% on room air.  Lab studies notable for history of potassium 3.4, serum bicarb 21, BUN 41, creatinine 4.1, calcium 15, magnesium 1.4 GFR 15.  Repeat calcium 13.5.  Phosphorus greater than 30.  Hemoglobin 7.6.  Review of Systems: Review of systems as noted in the HPI. All other systems reviewed and are negative.   Past Medical History:  Diagnosis Date   Arthritis    "lower back" (12/18/2017)   Better eye: moderate vision impairment; lesser eye: blind    GERD (gastroesophageal reflux disease)    Glaucoma, both eyes    High cholesterol    Hypertension    Hypothyroidism    Irregular heartbeat    Legally blind    "both eyes; can see some out of left eye"   OSA (obstructive sleep apnea)    Thyroid disease    Type II diabetes mellitus (HCC)    Vitamin B12 deficiency    Past Surgical History:  Procedure Laterality Date   LEFT  HEART CATH AND CORONARY ANGIOGRAPHY N/A 12/18/2017   Procedure: LEFT HEART CATH AND CORONARY ANGIOGRAPHY;  Surgeon: Elder Negus, MD;  Location: [facility] INVASIVE CV LAB;  Service: Cardiovascular;  Laterality: N/A;   NOSE SURGERY     "was crooked; they straightened it out" (12/18/2017)    Social History:  reports that he has never smoked. He has never used smokeless tobacco. He reports current alcohol use. He reports that he does not use drugs.   Allergies  Allergen Reactions   Hctz [Hydrochlorothiazide] Other (See Comments)    Pancreatitis     Family History  Problem Relation Age of Onset   Diabetes Mellitus II Mother 26   Diabetes Mellitus II Sister    Breast cancer Sister    Heart failure Sister 14      Prior to Admission medications   Medication Sig Start Date End Date Taking? Authorizing Provider  amLODipine (NORVASC) 5 MG tablet TAKE 1 TABLET(5 MG) BY MOUTH DAILY 12/28/20   Yates Decamp, MD  aspirin 81 MG tablet Take 81 mg by mouth daily.    [provider]  brimonidine (ALPHAGAN) 0.2 % ophthalmic solution Place 1 drop into both eyes 3 (three) times daily.  01/17/15   [provider]  empagliflozin (JARDIANCE) 25 MG TABS tablet Take 1 tablet (25 mg total) by mouth daily before breakfast. 12/17/20   Yates Decamp, MD  hydrALAZINE (APRESOLINE) 50 MG tablet TAKE 1 TABLET(50  MG) BY MOUTH THREE TIMES DAILY 12/09/21   Yates Decamp, MD  latanoprost (XALATAN) 0.005 % ophthalmic solution Place 1 drop into both eyes at bedtime.    [provider]  levothyroxine (SYNTHROID, LEVOTHROID) 150 MCG tablet Take 150 mcg by mouth daily. 12/10/14   [provider]  losartan (COZAAR) 100 MG tablet Take 1 tablet by mouth daily. 09/14/20   [provider]  metFORMIN (GLUCOPHAGE) 1000 MG tablet Take 500 mg by mouth 2 (two) times daily with a meal.     [provider]  Metoprolol Tartrate 75 MG TABS Take 75 mg by mouth 2 (two) times daily.     [provider]  Rosuvastatin Calcium 20 MG CPSP Take 20 mg by mouth daily.     [provider]  spironolactone (ALDACTONE) 25 MG tablet TAKE 1 TABLET BY MOUTH DAILY IN THE MORNING 01/31/21   Yates Decamp, MD    Physical Exam: BP (!) 158/84   Pulse (!) 101   Temp 98 F (36.7 C) (Oral)   Resp 12   Ht 5\' 6"  (1.676 m)   Wt 87.6 kg   SpO2 97%   BMI 31.17 kg/m   General: 72 y.o. year-old male well developed well nourished in no acute distress.  Alert and oriented x3. Cardiovascular: Regular rate and rhythm with no rubs or gallops.  No thyromegaly or JVD noted.  No lower extremity edema. 2/4 pulses in all 4 extremities. Respiratory: Clear to auscultation with no wheezes or rales. Good inspiratory effort. Abdomen: Soft nontender nondistended with normal bowel sounds x4 quadrants. Muskuloskeletal: No cyanosis, clubbing or edema noted bilaterally Neuro: CN II-XII intact, strength, sensation, reflexes Skin: No ulcerative lesions noted or rashes Psychiatry: Judgement and insight appear normal. Mood is appropriate for condition and setting          Labs on Admission:  Basic Metabolic Panel: Recent Labs  Lab 09/25/22 1615  NA 137  K 3.4*  CL 105  CO2 21*  GLUCOSE 148*  BUN 41*  CREATININE 4.14*  CALCIUM >15.0*  MG 1.4*   Liver Function Tests: No results for input(s): "AST", "ALT", "ALKPHOS", "BILITOT", "PROT", "ALBUMIN" in the last 168 hours. No results for input(s): "LIPASE", "AMYLASE" in the last 168 hours. No results for input(s): "AMMONIA" in the last 168 hours. CBC: Recent Labs  Lab 09/25/22 1615  WBC 5.6  NEUTROABS 2.6  HGB 7.8*  HCT 25.1*  MCV 89.3  PLT 208   Cardiac Enzymes: No results for input(s): "CKTOTAL", "CKMB", "CKMBINDEX", "TROPONINI" in the last 168 hours.  BNP (last 3 results) No results for input(s): "BNP" in the last 8760 hours.  ProBNP (last 3 results) No results for input(s): "PROBNP" in the last 8760 hours.  CBG: No results for  input(s): "GLUCAP" in the last 168 hours.  Radiological Exams on Admission: No results found.  EKG: I independently viewed the EKG done and my findings are as followed: Sinus tachycardia rate of 121.  Nonspecific ST-T changes.  QTc 426.  Assessment/Plan Present on Admission:  Hypercalcemia  Principal Problem:   Hypercalcemia  Severe hypercalcemia, unclear etiology. Presented with serum calcium greater than 15 Seen by nephrology, appreciate assistance Received 2 L IV fluid bolus in the ED. Started on IV fluid maintenance NS at 150 cc/h by nephrology. Calcitonin and diuretics as needed Check calcium level, PTH, PTH RP, vitamin D level, calcitriol level, urine calcium and creatinine level, SPEP and free light chain.  AKI on CKD 3 A likely  prerenal in the setting of dehydration from severe hypercalcemia Continue to treat underlying conditions Continue IV fluid maintenance Monitor urine output Repeat chemistry panel in the morning  Non anion gap metabolic acidosis AG 12 and serum Bicarb 19 Management per nephrology  Hypokalemia Hypomagnesemia Repleted Repeat levels in the morning  Chronic normocytic anemia Obtain Iron studies  Hyperphosphatemia Management per nephrology   DVT prophylaxis: SQ Hep TID   Code Status: Full   Family Communication: Updated his wife at bedside   Disposition Plan: Admitted to progressive unit   Consults called: Nephrology   Admission status: Inpatient status    Status is: Inpatient The patient requires at least 2 midnights for further evaluation and management of his present condition.   Darlin Drop MD [facility] Pager [phone]  If 7PM-7AM, please contact night-coverage [email or web address] Password [email or web address]  09/25/2022, 11:02 PM

## 2022-09-25 NOTE — Consult Note (Addendum)
[facility] Nephrology Consultation Note  Requesting MD: Dr. Melene Plan Reason for consult: Hypercalcemia.  HPI:  Scott Bass is a 72 y.o. male with past medical history significant for hypertension, hypothyroidism, OSA, type 2 diabetes, chronic back pain who was sent from doctor's office for abnormal lab results seen as a consultation for the evaluation and management of hypercalcemia.  Apparently the patient went to see his doctor when the labs showed hypomagnesemia and hypercalcemia therefore he was sent to the ER.  The patient stated he has generalized body pain including hip pain, chronic back pain and gradual slowing down.  In the ER, the vitals showed blood pressure of 123/71, temperature 98.5, in room air.  The lab consistent with potassium level of 3.4, creatinine level 4.14, BUN 41, calcium level more than 15, magnesium level 1.4, hemoglobin 7.8.  The previous labs from 09/08/2022 with creatinine level of 1.46 and a calcium level was 11 at that time. The home medication include amlodipine, Jardiance, hydralazine, Synthroid, losartan, metformin, metoprolol, statin and Aldactone.  Patient denies intake of vitamin D, Tums or any calcium supplement. After discussion with the ER physician patient received about 2 L of IV fluid, furosemide and magnesium sulfate. The chest x-ray showed 2.5 cm opacity in the right upper lobe lung and mild cardiomegaly.  The patient is complaining of urgency to pass urine.  He is legally blind.  He thinks that he is confused recently.  His wife was at the bedside.  He denies headache, dizziness, nausea, vomiting, chest pain, shortness of breath. He notes that he is at [facility] and this is May 2024.  PMHx:   Past Medical History:  Diagnosis Date   Arthritis    "lower back" (12/18/2017)   Better eye: moderate vision impairment; lesser eye: blind    GERD (gastroesophageal reflux disease)    Glaucoma, both eyes    High cholesterol     Hypertension    Hypothyroidism    Irregular heartbeat    Legally blind    "both eyes; can see some out of left eye"   OSA (obstructive sleep apnea)    Thyroid disease    Type II diabetes mellitus (HCC)    Vitamin B12 deficiency     Past Surgical History:  Procedure Laterality Date   LEFT HEART CATH AND CORONARY ANGIOGRAPHY N/A 12/18/2017   Procedure: LEFT HEART CATH AND CORONARY ANGIOGRAPHY;  Surgeon: Elder Negus, MD;  Location: [facility] INVASIVE CV LAB;  Service: Cardiovascular;  Laterality: N/A;   NOSE SURGERY     "was crooked; they straightened it out" (12/18/2017)    Family Hx:  Family History  Problem Relation Age of Onset   Diabetes Mellitus II Mother 94   Diabetes Mellitus II Sister    Breast cancer Sister    Heart failure Sister 49    Social History:  reports that he has never smoked. He has never used smokeless tobacco. He reports current alcohol use. He reports that he does not use drugs.  Allergies:  Allergies  Allergen Reactions   Hctz [Hydrochlorothiazide] Other (See Comments)    Pancreatitis     Medications: Prior to Admission medications   Medication Sig Start Date End Date Taking? Authorizing Provider  amLODipine (NORVASC) 5 MG tablet TAKE 1 TABLET(5 MG) BY MOUTH DAILY 12/28/20   Yates Decamp, MD  aspirin 81 MG tablet Take 81 mg by mouth daily.    [provider]  brimonidine (ALPHAGAN) 0.2 % ophthalmic solution Place 1  drop into both eyes 3 (three) times daily.  01/17/15   [provider]  empagliflozin (JARDIANCE) 25 MG TABS tablet Take 1 tablet (25 mg total) by mouth daily before breakfast. 12/17/20   Yates Decamp, MD  hydrALAZINE (APRESOLINE) 50 MG tablet TAKE 1 TABLET(50 MG) BY MOUTH THREE TIMES DAILY 12/09/21   Yates Decamp, MD  latanoprost (XALATAN) 0.005 % ophthalmic solution Place 1 drop into both eyes at bedtime.    [provider]  levothyroxine (SYNTHROID, LEVOTHROID) 150 MCG tablet Take 150 mcg by mouth daily. 12/10/14    [provider]  losartan (COZAAR) 100 MG tablet Take 1 tablet by mouth daily. 09/14/20   [provider]  metFORMIN (GLUCOPHAGE) 1000 MG tablet Take 500 mg by mouth 2 (two) times daily with a meal.     [provider]  Metoprolol Tartrate 75 MG TABS Take 75 mg by mouth 2 (two) times daily.     [provider]  Rosuvastatin Calcium 20 MG CPSP Take 20 mg by mouth daily.     [provider]  spironolactone (ALDACTONE) 25 MG tablet TAKE 1 TABLET BY MOUTH DAILY IN THE MORNING 01/31/21   Yates Decamp, MD    I have reviewed the patient's current medications.  Labs: Renal Panel: Recent Labs  Lab 09/25/22 1615  NA 137  K 3.4*  CL 105  CO2 21*  GLUCOSE 148*  BUN 41*  CREATININE 4.14*  CALCIUM >15.0*  MG 1.4*     CBC:    Latest Ref Rng & Units 09/25/2022    4:15 PM 09/08/2022   10:40 AM 11/22/2015    9:27 AM  CBC  WBC 4.0 - 10.5 K/uL 5.6  6.0  8.4   Hemoglobin 13.0 - 17.0 g/dL 7.8  7.2  82.9   Hematocrit 39.0 - 52.0 % 25.1  22.9  32.3   Platelets 150 - 400 K/uL 208  234       Anemia Panel:  Recent Labs    09/08/22 1040 09/25/22 1615  HGB 7.2* 7.8*  MCV 89.1 89.3    No results for input(s): "AST", "ALT", "ALKPHOS", "BILITOT", "PROT", "ALBUMIN" in the last 168 hours.  Lab Results  Component Value Date   HGBA1C 5.9 09/05/2006    ROS:  Pertinent items noted in HPI and remainder of comprehensive ROS otherwise negative.  Physical Exam: Vitals:   09/25/22 1834 09/25/22 2001  BP: (!) 159/84 (!) 163/79  Pulse: (!) 110 (!) 107  Resp: 18 16  Temp: 98.1 F (36.7 C) 98 F (36.7 C)  SpO2: 100% 95%     General exam: Appears calm and comfortable  Respiratory system: Clear to auscultation. Respiratory effort normal. No wheezing or crackle Cardiovascular system: S1 & S2 heard, RRR.  No pedal edema. Gastrointestinal system: Abdomen is nondistended, soft and nontender. Normal bowel sounds heard. Central nervous system: Alert and  oriented to hospital, May 2024 and name. No focal neurological deficits. Extremities: No edema. Skin: No rashes, lesions or ulcers Psychiatry: Judgement and insight appear normal. Mood & affect appropriate.   Assessment/Plan:  # Severe hypercalcemia: The calcium level is >15.  From the history and the medication list no intake of vitamin D or oral calcium supplement noted.  He already received 2 L of IV fluid in the ER, I will start maintenance IV fluid NS at 150 cc an hour. We will try to manage hypercalcemia medically with IV fluid, calcitonin and diuretics if needed.  Try to avoid dialysis if  possible in this elderly male. We will start the workup of hypercalcemia, may need to rule out underlying malignancy because the chest x-ray showing right lung opacity. Check labs including PTH, PTH RP, vitamin D level, calcitriol level, urine calcium and creatinine level, SPEP and free light chain.  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  Checking urine, bladder scan.  I will insert Foley catheter for strict ins and out.  He is having difficulty urinating in the urinal.  Order kidney ultrasound to rule out obstruction. Recommend to hold Jardiance, losartan, Aldactone and metformin. Continue fluid and management of hypercalcemia as above.  Repeat lab in the morning.  # Hypokalemia: Repleted potassium chloride.  Monitor lab.  # Hypomagnesemia: Got magnesium, follow labs.  # Anemia: Further workup per primary team, recommend checking iron, stool blood test etc.  # Hypertension: Blood pressure acceptable on arrival.  Can resume Norvasc, hydralazine, metoprolol and monitor BP.  Thank you for the consult.  We will follow with you.   Chelsey Kimberley Jaynie Collins 09/25/2022, 8:26 PM  [facility].

## 2022-09-25 NOTE — ED Notes (Signed)
Shift report received, assumed care of patient at this time.  

## 2022-09-25 NOTE — ED Provider Notes (Signed)
[place] EMERGENCY DEPARTMENT AT [facility] Provider Note   CSN: [id number] Arrival date & time: 09/25/22  1534     History  Chief Complaint  Patient presents with   Diarrhea   Urinary Frequency    Scott Bass is a 72 y.o. male.  HPI 72 year old male history of hypertension, hypothyroidism, OSA, type 2 diabetes presenting for hypercalcemia.  Patient states he went to see his doctor and had hypomagnesemia and hypercalcemia.  Patient states over the last week or so he has been confused, has chronic back and left hip pain.  He has not hit his head or fallen.  He reports no change in urine output but has had some incontinence.  He does feel that he empties his bladder.  Has not had any fevers or chills.  No chest pain or difficulty breathing.  He denies any history of prior hypercalcemia.  Denies any change in medications.  Per chart review he appears to be taking amlodipine, Jardiance, hydralazine, losartan [place], Synthroid, metformin, metoprolol, spironolactone.     Home Medications Prior to Admission medications   Medication Sig Start Date End Date Taking? Authorizing Provider  amLODipine (NORVASC) 5 MG tablet TAKE 1 TABLET(5 MG) BY MOUTH DAILY 12/28/20   Yates Decamp, MD  aspirin 81 MG tablet Take 81 mg by mouth daily.    [provider]  brimonidine (ALPHAGAN) 0.2 % ophthalmic solution Place 1 drop into both eyes 3 (three) times daily.  01/17/15   [provider]  empagliflozin (JARDIANCE) 25 MG TABS tablet Take 1 tablet (25 mg total) by mouth daily before breakfast. 12/17/20   Yates Decamp, MD  hydrALAZINE (APRESOLINE) 50 MG tablet TAKE 1 TABLET(50 MG) BY MOUTH THREE TIMES DAILY 12/09/21   Yates Decamp, MD  latanoprost (XALATAN) 0.005 % ophthalmic solution Place 1 drop into both eyes at bedtime.    [provider]  levothyroxine (SYNTHROID, LEVOTHROID) 150 MCG tablet Take 150 mcg by mouth daily. 12/10/14   [provider]  losartan (COZAAR)  100 MG tablet Take 1 tablet by mouth daily. 09/14/20   [provider]  metFORMIN (GLUCOPHAGE) 1000 MG tablet Take 500 mg by mouth 2 (two) times daily with a meal.     [provider]  Metoprolol Tartrate 75 MG TABS Take 75 mg by mouth 2 (two) times daily.     [provider]  Rosuvastatin Calcium 20 MG CPSP Take 20 mg by mouth daily.     [provider]  spironolactone (ALDACTONE) 25 MG tablet TAKE 1 TABLET BY MOUTH DAILY IN THE MORNING 01/31/21   Yates Decamp, MD      Allergies    Hctz [hydrochlorothiazide]    Review of Systems   Review of Systems  Musculoskeletal:  Positive for back pain.  Psychiatric/Behavioral:  Positive for confusion.   All other systems reviewed and are negative.   Physical Exam Updated Vital Signs BP (!) 158/84   Pulse (!) 101   Temp 98 F (36.7 C) (Oral)   Resp 12   Ht 5\' 6"  (1.676 m)   Wt 87.6 kg   SpO2 97%   BMI 31.17 kg/m  Physical Exam Vitals and nursing note reviewed.  Constitutional:      General: He is not in acute distress.    Appearance: He is well-developed.  HENT:     Head: Normocephalic and atraumatic.     Nose: Nose normal.     Mouth/Throat:     Mouth: Mucous membranes  are moist.     Pharynx: Oropharynx is clear.  Eyes:     Extraocular Movements: Extraocular movements intact.     Conjunctiva/sclera: Conjunctivae normal.  Cardiovascular:     Rate and Rhythm: Normal rate and regular rhythm.     Heart sounds: No murmur heard. Pulmonary:     Effort: Pulmonary effort is normal. No respiratory distress.     Breath sounds: Normal breath sounds.  Abdominal:     Palpations: Abdomen is soft.     Tenderness: There is no abdominal tenderness. There is no guarding or rebound.  Musculoskeletal:        General: No swelling.     Cervical back: Neck supple.     Right lower leg: No edema.     Left lower leg: No edema.  Skin:    General: Skin is warm and dry.     Capillary Refill: Capillary refill takes  less than 2 seconds.  Neurological:     General: No focal deficit present.     Mental Status: He is alert and oriented to person, place, and time.     Cranial Nerves: No cranial nerve deficit.     Sensory: No sensory deficit.     Motor: No weakness.     Comments: Patient awake and alert.  He is somewhat confused on questioning but is oriented to person, place, time, situation.  Psychiatric:        Mood and Affect: Mood normal.     ED Results / Procedures / Treatments   Labs (all labs ordered are listed, but only abnormal results are displayed) Labs Reviewed  BASIC METABOLIC PANEL - Abnormal; Notable for the following components:      Result Value   Potassium 3.4 (*)    CO2 21 (*)    Glucose, Bld 148 (*)    BUN 41 (*)    Creatinine, Ser 4.14 (*)    Calcium >15.0 (*)    GFR, Estimated 15 (*)    All other components within normal limits  CBC WITH DIFFERENTIAL/PLATELET - Abnormal; Notable for the following components:   RBC 2.81 (*)    Hemoglobin 7.8 (*)    HCT 25.1 (*)    RDW 17.5 (*)    nRBC 0.5 (*)    Abs Immature Granulocytes 0.08 (*)    All other components within normal limits  MAGNESIUM - Abnormal; Notable for the following components:   Magnesium 1.4 (*)    All other components within normal limits  URINALYSIS, ROUTINE W REFLEX MICROSCOPIC - Abnormal; Notable for the following components:   Color, Urine STRAW (*)    Hgb urine dipstick MODERATE (*)    Bacteria, UA RARE (*)    All other components within normal limits  TSH  CALCIUM, IONIZED  CALCIUM, URINE, RANDOM  CREATININE, URINE, RANDOM  VITAMIN D 25 HYDROXY (VIT D DEFICIENCY, FRACTURES)  PARATHYROID HORMONE, INTACT (NO CA)  CALCITRIOL (1,25 DI-OH VIT D)  PROTEIN ELECTROPHORESIS, SERUM  KAPPA/LAMBDA LIGHT CHAINS  RENAL FUNCTION PANEL  BASIC METABOLIC PANEL  PHOSPHORUS  PTH-RELATED PEPTIDE  CBC  MAGNESIUM    EKG EKG Interpretation  Date/Time:  Monday Sep 25 2022 16:00:07 EDT Ventricular Rate:   121 PR Interval:  184 QRS Duration: 102 QT Interval:  300 QTC Calculation: 426 R Axis:   -3 Text Interpretation: Sinus tachycardia with Premature atrial complexes Left ventricular hypertrophy with repolarization abnormality ( R in aVL ) Abnormal ECG No significant change since last tracing Confirmed by Adela Lank,  Jesusita Oka [phone]) on 09/25/2022 6:51:20 PM  Radiology No results found.  Procedures Procedures    Medications Ordered in ED Medications  potassium chloride 10 mEq in 100 mL IVPB (10 mEq Intravenous New Bag/Given 09/25/22 2214)  0.9 %  sodium chloride infusion ( Intravenous New Bag/Given 09/25/22 2110)  calcitonin (MIACALCIN) injection 350 Units (350 Units Subcutaneous Given 09/25/22 2215)  heparin injection 5,000 Units (has no administration in time range)  sodium chloride 0.9 % bolus 1,000 mL (0 mLs Intravenous Stopped 09/25/22 2227)  magnesium sulfate IVPB 2 g 50 mL (0 g Intravenous Stopped 09/25/22 2219)  sodium chloride 0.9 % bolus 1,000 mL (0 mLs Intravenous Stopped 09/25/22 2227)  furosemide (LASIX) injection 40 mg (40 mg Intravenous Given 09/25/22 1956)    ED Course/ Medical Decision Making/ A&P Clinical Course as of 09/25/22 2257  Mon Sep 25, 2022  1955 EKG sinus tach rate 121, 2 inversions in high lateral leads, nonspecific ST/T wave abnormality in the precordial leads. [JD]    Clinical Course User Index [JD] Fulton Reek, MD                             Medical Decision Making Amount and/or Complexity of Data Reviewed Labs: ordered.  Risk Prescription drug management. Decision regarding hospitalization.   72 year old male presenting for hypercalcemia.  Vital signs reviewed.  On exam patient is alert and oriented but does get easily confused on questioning.  Has had chronic back pain recently no other new symptoms.  No medication changes or obvious cause for hypercalcemia.  His lab work is notable for hypomagnesemia, hypokalemia, severe hypercalcemia, AKI with  creatinine of 4.1.  I suspect the hypercalcemia is causing his mental status changes.  He is no focal deficits or signs of CVA.  CBC notable for significant anemia.  Patient ordered IV fluids, magnesium.  Nephrology was consulted for evaluation.  They evaluated the patient.  They recommend Lasix, calcitonin.  They do not recommend dialysis at this time and plan for medical management of his hypercalcemia.  I discussed the patient with the medicine service and he was admitted for further management.        Final Clinical Impression(s) / ED Diagnoses Final diagnoses:  AKI (acute kidney injury) (HCC)  Hypercalcemia    Rx / DC Orders ED Discharge Orders     None         Fulton Reek, MD 09/25/22 2257    Melene Plan, DO 09/25/22 2304

## 2022-09-25 NOTE — ED Notes (Signed)
EDP and CN notified of hypercalcemia

## 2022-09-25 NOTE — ED Provider Triage Note (Signed)
Emergency Medicine Provider Triage Evaluation Note  DEMETRIUS BRANDS , a 72 y.o. male  was evaluated in triage.  Patient complains of L calcium?  He reports that he was seen by primary and had x-rays of his back, hip and chest and was told that his calcium levels were too high.  He was sent here for further evaluation.  He does not believe they took labs.   Patient potentially had x-rays that showed calcium deposits and no hypercalcemia?  He says labs were not drawn.  Will order basic labs.  Heart rate 129 so we will also add EKG  Review of Systems  Positive:  Negative:   Physical Exam  BP 123/71 (BP Location: Right Arm)   Pulse (!) 129   Temp 98.5 F (36.9 C) (Oral)   Resp 16   SpO2 98%  Gen:   Awake, no distress   Resp:  Normal effort  MSK:   Moves extremities without difficulty  Other:  RRR  Medical Decision Making  Medically screening exam initiated at 4:09 PM.  Appropriate orders placed.  JEVEN MINERD was informed that the remainder of the evaluation will be completed by another provider, this initial triage assessment does not replace that evaluation, and the importance of remaining in the ED until their evaluation is complete.     Saddie Benders, PA-C 09/25/22 1610

## 2022-09-25 NOTE — ED Notes (Signed)
Patient's wife in room with patient, both patient and wife are visually impaired. Provided patient and wife with meal and beverage.

## 2022-09-25 NOTE — ED Triage Notes (Signed)
Pt reports he was Sent by dr, did x rays of hips, back, chest; pt reports we was told calcium was too low, sent to ed for further eval

## 2022-09-26 ENCOUNTER — Inpatient Hospital Stay (HOSPITAL_COMMUNITY): Payer: Medicare PPO

## 2022-09-26 LAB — GLUCOSE, CAPILLARY
Glucose-Capillary: 127 mg/dL — ABNORMAL HIGH (ref 70–99)
Glucose-Capillary: 141 mg/dL — ABNORMAL HIGH (ref 70–99)

## 2022-09-26 LAB — RENAL FUNCTION PANEL
Albumin: 3 g/dL — ABNORMAL LOW (ref 3.5–5.0)
Anion gap: 12 (ref 5–15)
BUN: 34 mg/dL — ABNORMAL HIGH (ref 8–23)
CO2: 19 mmol/L — ABNORMAL LOW (ref 22–32)
Calcium: 13.5 mg/dL (ref 8.9–10.3)
Chloride: 107 mmol/L (ref 98–111)
Creatinine, Ser: 3.24 mg/dL — ABNORMAL HIGH (ref 0.61–1.24)
GFR, Estimated: 20 mL/min — ABNORMAL LOW (ref >60)
Glucose, Bld: 154 mg/dL — ABNORMAL HIGH (ref 70–99)
Phosphorus: >30 mg/dL — ABNORMAL HIGH (ref 2.5–4.6)
Potassium: 3.5 mmol/L (ref 3.5–5.1)
Sodium: 138 mmol/L (ref 135–145)

## 2022-09-26 LAB — CBC
HCT: 23.9 % — ABNORMAL LOW (ref 39.0–52.0)
Hemoglobin: 7.6 g/dL — ABNORMAL LOW (ref 13.0–17.0)
MCH: 27.9 pg (ref 26.0–34.0)
MCHC: 31.8 g/dL (ref 30.0–36.0)
MCV: 87.9 fL (ref 80.0–100.0)
Platelets: 192 10*3/uL (ref 150–400)
RBC: 2.72 MIL/uL — ABNORMAL LOW (ref 4.22–5.81)
RDW: 17.6 % — ABNORMAL HIGH (ref 11.5–15.5)
WBC: 5.8 10*3/uL (ref 4.0–10.5)
nRBC: 0.5 % — ABNORMAL HIGH (ref 0.0–0.2)

## 2022-09-26 LAB — CBG MONITORING, ED
Glucose-Capillary: 150 mg/dL — ABNORMAL HIGH (ref 70–99)
Glucose-Capillary: 136 mg/dL — ABNORMAL HIGH (ref 70–99)

## 2022-09-26 LAB — IRON AND TIBC
Iron: 50 ug/dL (ref 45–182)
Saturation Ratios: 14 % — ABNORMAL LOW (ref 17.9–39.5)
TIBC: 357 ug/dL (ref 250–450)
UIBC: 307 ug/dL

## 2022-09-26 LAB — MAGNESIUM: Magnesium: 1.7 mg/dL (ref 1.7–2.4)

## 2022-09-26 LAB — HEMOGLOBIN A1C
Hgb A1c MFr Bld: 7.3 % — ABNORMAL HIGH (ref 4.8–5.6)
Mean Plasma Glucose: 162.81 mg/dL

## 2022-09-26 LAB — FERRITIN: Ferritin: 287 ng/mL (ref 24–336)

## 2022-09-26 MED ORDER — LATANOPROST 0.005 % OP SOLN
1.0000 [drp] | Freq: Every day | OPHTHALMIC | Status: DC
  Administered 2022-09-26 – 2022-09-30 (×5): 1 [drp] via OPHTHALMIC
  Filled 2022-09-26: qty 2.5

## 2022-09-26 MED ORDER — CALCITONIN (SALMON) 200 UNIT/ML IJ SOLN
4.0000 [IU]/kg | Freq: Two times a day (BID) | INTRAMUSCULAR | Status: AC
  Administered 2022-09-27 (×2): 350 [IU] via SUBCUTANEOUS
  Filled 2022-09-26 (×2): qty 1.75

## 2022-09-26 MED ORDER — SODIUM CHLORIDE 0.9 % IV SOLN
250.0000 mg | Freq: Once | INTRAVENOUS | Status: AC
  Administered 2022-09-26: 250 mg via INTRAVENOUS
  Filled 2022-09-26: qty 20

## 2022-09-26 MED ORDER — CHLORHEXIDINE GLUCONATE CLOTH 2 % EX PADS
6.0000 | MEDICATED_PAD | Freq: Every day | CUTANEOUS | Status: DC
  Administered 2022-09-26 – 2022-10-01 (×5): 6 via TOPICAL

## 2022-09-26 MED ORDER — BRIMONIDINE TARTRATE 0.15 % OP SOLN
1.0000 [drp] | Freq: Three times a day (TID) | OPHTHALMIC | Status: DC
  Administered 2022-09-26 – 2022-10-01 (×15): 1 [drp] via OPHTHALMIC
  Filled 2022-09-26: qty 5

## 2022-09-26 NOTE — Progress Notes (Signed)
PROGRESS NOTE    Scott Bass  GNF:[id number] DOB: Dec 05, 1950 DOA: 09/25/2022 PCP: Tracey Harries, MD    Brief Narrative:  72 year old with history of hypertension, hypothyroidism, sleep apnea, diabetes type 2, chronic back pain who was sent from doctor's office for abnormal lab mainly hypercalcemia.  Patient does complain of generalized body pain, hip pain and chronic back pain.  On arrival, creatinine level of 4.14 with recent known creatinine of 1.4, calcium more than 15.  Magnesium 1.4.  Hemoglobin 7.8.  Admitted with nephrology consultation.   Assessment & Plan:   Severe hypercalcemia with calcium more than 15.  Hypercalcemia of malignancy. No particular offending medications found. Resuscitative aggressively, continue on maintenance IV fluid with normal saline 150 mill per hour.  Vitamin D level is normal. PTH, PTH related protein, urine calcium and creatinine level, SPEP, free light chain pending. Bisphosphonates, contraindicated Currently receiving calcitonin.  Followed by nephrology. Patient probably has been bony metastatic disease or multiple myeloma causing hypercalcemia.  AKI on CKD stage IIIa: Due to above.  Ongoing use of ARB and Jardiance. Foley catheter for in and out monitoring. Renal ultrasound without hydronephrosis.  Continue fluid management.  Hypokalemia, hypomagnesemia: Replaced.  Monitor.  Essential hypertension: Blood pressure stable on Norvasc and hydralazine.  Abnormal chest x-ray: CT scan of the chest without contrast was done that shows bony destructive lesion on the second rib and innumerable lytic lesions on other ribs.  Radiologically consistent with multiple myeloma. May benefit with biopsies, will allow medical stabilization before proceeding for biopsy. Will send for skeletal survey.   DVT prophylaxis: heparin injection 5,000 Units Start: 09/25/22 2300   Code Status: Full code Family Communication: Wife at the bedside Disposition Plan:  Status is: Inpatient Remains inpatient appropriate because: Severe electrolyte abnormalities     Consultants:  Nephrology  Procedures:  None  Antimicrobials:  None   Subjective: Patient seen in the morning rounds.  He was in the emergency room.  Wife was at the bedside.  Both of them are visually impaired.  Patient does appreciate webbing of fingers.  Denies any complaints except uncomfortable night.  Denies any chest pain or pain in the localized area of the bones.  Objective: Vitals:   09/26/22 1145 09/26/22 1208 09/26/22 1239 09/26/22 1551  BP: (!) 158/79  (!) 161/81 (!) 154/86  Pulse: 95  (!) 102 98  Resp: 17  18 20   Temp:  97.7 F (36.5 C) 97.7 F (36.5 C) 98 F (36.7 C)  TempSrc:  Oral Oral Oral  SpO2: 99%  97% 97%  Weight:      Height:        Intake/Output Summary (Last 24 hours) at 09/26/2022 1621 Last data filed at 09/26/2022 1035 Gross per 24 hour  Intake 2520 ml  Output 3650 ml  Net -1130 ml   Filed Weights   09/25/22 2100 09/25/22 2114  Weight: 87.6 kg 87.6 kg    Examination:  General exam: Appears calm and comfortable.  Not in any obvious distress. Patient is alert oriented.  He does not have any focal neurological deficits. Respiratory system: Clear to auscultation. Respiratory effort normal. Cardiovascular system: S1 & S2 heard, RRR.  Gastrointestinal system: Soft.  Nontender.  Bowel sound present. Central nervous system: Alert and oriented. No focal neurological deficits. Extremities: Symmetric 5 x 5 power.  Generalized weakness.    Data Reviewed: I have personally reviewed following labs and imaging studies  CBC: Recent Labs  Lab 09/25/22 1615 09/26/22 0217  WBC 5.6  5.8  NEUTROABS 2.6  --   HGB 7.8* 7.6*  HCT 25.1* 23.9*  MCV 89.3 87.9  PLT 208 192   Basic Metabolic Panel: Recent Labs  Lab 09/25/22 1615 09/25/22 2204 09/26/22 0217  NA 137 139 138  K 3.4* 3.9 3.5  CL 105 108 107  CO2 21* 20* 19*  GLUCOSE 148* 129* 154*   BUN 41* 37* 34*  CREATININE 4.14* 3.42* 3.24*  CALCIUM >15.0* 14.2* 13.5*  MG 1.4*  --  1.7  PHOS  --  >30.0* >30.0*   GFR: Estimated Creatinine Clearance: 21.7 mL/min (A) (by C-G formula based on SCr of 3.24 mg/dL (H)). Liver Function Tests: Recent Labs  Lab 09/26/22 0217  ALBUMIN 3.0*   No results for input(s): "LIPASE", "AMYLASE" in the last 168 hours. No results for input(s): "AMMONIA" in the last 168 hours. Coagulation Profile: No results for input(s): "INR", "PROTIME" in the last 168 hours. Cardiac Enzymes: No results for input(s): "CKTOTAL", "CKMB", "CKMBINDEX", "TROPONINI" in the last 168 hours. BNP (last 3 results) No results for input(s): "PROBNP" in the last 8760 hours. HbA1C: Recent Labs    09/26/22 0217  HGBA1C 7.3*   CBG: Recent Labs  Lab 09/25/22 2319 09/26/22 0719 09/26/22 1203 09/26/22 1550  GLUCAP 125* 150* 136* 127*   Lipid Profile: No results for input(s): "CHOL", "HDL", "LDLCALC", "TRIG", "CHOLHDL", "LDLDIRECT" in the last 72 hours. Thyroid Function Tests: Recent Labs    09/25/22 1954  TSH 0.619   Anemia Panel: Recent Labs    09/26/22 0524  FERRITIN 287  TIBC 357  IRON 50   Sepsis Labs: No results for input(s): "PROCALCITON", "LATICACIDVEN" in the last 168 hours.  No results found for this or any previous visit (from the past 240 hour(s)).       Radiology Studies: CT CHEST WO CONTRAST  Result Date: 09/26/2022 CLINICAL DATA:  Abnormal xray - lung opacity/opacities EXAM: CT CHEST WITHOUT CONTRAST TECHNIQUE: Multidetector CT imaging of the chest was performed following the standard protocol without IV contrast. RADIATION DOSE REDUCTION: This exam was performed according to the departmental dose-optimization program which includes automated exposure control, adjustment of the mA and/or kV according to patient size and/or use of iterative reconstruction technique. COMPARISON:  Chest x-ray 09/08/2022 FINDINGS: Cardiovascular: Heart is  normal size. Aorta is normal caliber. Aortic atherosclerosis. Mediastinum/Nodes: No mediastinal, hilar, or axillary adenopathy. Trachea and esophagus are unremarkable. Thyroid unremarkable. Lungs/Pleura: No confluent airspace opacities or effusions. No pulmonary nodules or masses. Upper Abdomen: No acute findings Musculoskeletal: Chest wall soft tissues are unremarkable. The density seen on prior chest x-ray reflects and anterior right 2nd rib lesion with extension into the adjacent soft tissues and protrusion into the right upper lung. This measures 4 cm in greatest diameter. There are innumerable lytic lesions throughout the entire visible osseous structures occluding thoracic spine, all ribs, scapulae bilaterally, proximal humeri, clavicles and sternum. IMPRESSION: Abnormality seen on chest x-ray reflects bone destruction and soft tissue mass extending from the anterior right 2nd rib. Innumerable diffuse lytic lesions throughout the visualized bony skeleton. Findings are compatible with metastases or myeloma. Recommend clinical correlation. Aortic Atherosclerosis (ICD10-I70.0). Electronically Signed   By: Charlett Nose M.D.   On: 09/26/2022 09:15   US RENAL  Result Date: 09/26/2022 CLINICAL DATA:  Acute kidney injury EXAM: RENAL / URINARY TRACT ULTRASOUND COMPLETE COMPARISON:  None Available. FINDINGS: Right Kidney: Renal measurements: 10.6 x 5.1 x 5.8 cm = volume: 162 mL. Echogenicity within normal limits. No mass or hydronephrosis  visualized. Left Kidney: Renal measurements: 10.4 x 6.3 x 6.7 cm = volume: 228 mL. Echogenicity within normal limits. No mass or hydronephrosis visualized. Bladder: Foley catheter balloon is seen within a decompressed bladder lumen. Other: None. IMPRESSION: 1. Normal renal sonogram. Electronically Signed   By: Helyn Numbers M.D.   On: 09/26/2022 00:07        Scheduled Meds:  aspirin EC  81 mg Oral Daily   brimonidine  1 drop Both Eyes TID   [START ON 09/27/2022] calcitonin   4 Units/kg Subcutaneous BID   Chlorhexidine Gluconate Cloth  6 each Topical Daily   heparin  5,000 Units Subcutaneous Q8H   insulin aspart  0-5 Units Subcutaneous QHS   insulin aspart  0-9 Units Subcutaneous TID WC   latanoprost  1 drop Both Eyes QHS   levothyroxine  150 mcg Oral Daily   rosuvastatin  10 mg Oral Daily   Continuous Infusions:  sodium chloride 150 mL/hr at 09/26/22 1304   ferric gluconate (FERRLECIT) IVPB 250 mg (09/26/22 1524)     LOS: 1 day    Time spent: 40 minutes    Dorcas Carrow, MD [facility] Pager [phone]

## 2022-09-26 NOTE — Plan of Care (Signed)

## 2022-09-26 NOTE — TOC Initial Note (Signed)
Transition of Care [facility]) - Initial/Assessment Note    Patient Details  Name: Scott Bass MRN: [id number] Date of Birth: Jun 25, 1950  [facility] of [facility]) CM/SW Contact:    Gordy Clement, RN Phone Number: 09/26/2022, 2:48 PM  Clinical Narrative:     Patient presented to ED from Providers office for hypomagnesemia and  hypercalcemia. Following fluids/electrolytes/ labs. Incidental finding of lung opacities.   From Home with Wife.   TOC will continue to follow patient for any additional discharge needs                  Expected Discharge Plan: Home/Self Care Barriers to Discharge: Continued Medical Work up   Patient Goals and CMS Choice            Expected Discharge Plan and Services       Living arrangements for the past 2 months: Single Family Home                 DME Arranged: N/A DME Agency: NA       HH Arranged: NA HH Agency: NA        Prior Living Arrangements/Services Living arrangements for the past 2 months: Single Family Home Lives with:: Spouse            Care giver support system in place?: Yes (comment)      Activities of Daily Living      Permission Sought/Granted                  Emotional Assessment           Psych Involvement: No (comment)  Admission diagnosis:  Hypercalcemia [E83.52] AKI (acute kidney injury) (HCC) [N17.9] Patient Active Problem List   Diagnosis Date Noted   Hypercalcemia 09/25/2022   Claudication (HCC) 07/15/2018   Nonischemic cardiomyopathy (HCC) 07/15/2018   Abnormal stress test 12/17/2017   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 [place] [address] Phone: [phone] Fax: [phone]  [facility] - [place], [place] - [address] [place] [address] Phone: [phone] Fax: [phone]     Social Determinants of Health (SDOH) Social History: SDOH Screenings   Tobacco Use: Low Risk  (09/25/2022)   SDOH Interventions:     Readmission Risk Interventions     No data to display

## 2022-09-26 NOTE — ED Notes (Signed)
..ED TO INPATIENT HANDOFF REPORT  ED Nurse Name and Phone #: [phone]  S Name/Age/Gender Scott Bass 72 y.o. male Room/Bed: 002C/002C  Code Status   Code Status: Full Code  Home/SNF/Other Home Patient oriented to: self, place, time, and situation Is this baseline? Yes   Triage Complete: Triage complete  Chief Complaint Hypercalcemia [E83.52]  Triage Note Pt reports he was Sent by dr, did x rays of hips, back, chest; pt reports we was told calcium was too low, sent to ed for further eval   Allergies Allergies  Allergen Reactions   Hctz [Hydrochlorothiazide] Other (See Comments)    Pancreatitis     Level of Care/Admitting Diagnosis ED Disposition     ED Disposition  Admit   Condition  --   Comment  Hospital Area: [facility] [[id number]]  Level of Care: Progressive [102]  Admit to Progressive based on following criteria: NEUROLOGICAL AND NEUROSURGICAL complex patients with significant risk of instability, who do not meet ICU criteria, yet require close observation or frequent assessment (< / = every 2 - 4 hours) with medical / nursing intervention.  Admit to Progressive based on following criteria: NEPHROLOGY stable condition requiring close monitoring for AKI, requiring Hemodialysis or Peritoneal Dialysis either from expected electrolyte imbalance, acidosis, or fluid overload that can be managed by NIPPV or high flow oxygen.  May admit patient to [facility] or [facility] if equivalent level of care is available:: No  Covid Evaluation: Asymptomatic - no recent exposure (last 10 days) testing not required  Diagnosis: Hypercalcemia [275.42.ICD-9-CM]  Admitting Physician: Darlin Drop [[id number]]  Attending Physician: Darlin Drop [[id number]]  Certification:: I certify this patient will need inpatient services for at least 2 midnights  Estimated Length of Stay: 2          B Medical/Surgery History Past Medical History:  Diagnosis Date   Arthritis     "lower back" (12/18/2017)   Better eye: moderate vision impairment; lesser eye: blind    GERD (gastroesophageal reflux disease)    Glaucoma, both eyes    High cholesterol    Hypertension    Hypothyroidism    Irregular heartbeat    Legally blind    "both eyes; can see some out of left eye"   OSA (obstructive sleep apnea)    Thyroid disease    Type II diabetes mellitus (HCC)    Vitamin B12 deficiency    Past Surgical History:  Procedure Laterality Date   LEFT HEART CATH AND CORONARY ANGIOGRAPHY N/A 12/18/2017   Procedure: LEFT HEART CATH AND CORONARY ANGIOGRAPHY;  Surgeon: Elder Negus, MD;  Location: [facility] INVASIVE [facility];  Service: Cardiovascular;  Laterality: N/A;   NOSE SURGERY     "was crooked; they straightened it out" (12/18/2017)     A IV Location/Drains/Wounds Patient Lines/Drains/Airways Status     Active Line/Drains/Airways     Name Placement date Placement time Site Days   Peripheral IV 09/25/22 20 G Anterior;Right Forearm 09/25/22  1954  Forearm  1   Urethral Catheter msw 16 Fr. 09/25/22  2035  --  1            Intake/Output Last 24 hours  Intake/Output Summary (Last 24 hours) at 09/26/2022 1139 Last data filed at 09/26/2022 1035 Gross per 24 hour  Intake 2520 ml  Output 3650 ml  Net -1130 ml    Labs/Imaging Results for orders placed or performed during the hospital encounter of 09/25/22 (from the past 48  hour(s))  Basic metabolic panel     Status: Abnormal   Collection Time: 09/25/22  4:15 PM  Result Value Ref Range   Sodium 137 135 - 145 mmol/L   Potassium 3.4 (L) 3.5 - 5.1 mmol/L   Chloride 105 98 - 111 mmol/L   CO2 21 (L) 22 - 32 mmol/L   Glucose, Bld 148 (H) 70 - 99 mg/dL    Comment: Glucose reference range applies only to samples taken after fasting for at least 8 hours.   BUN 41 (H) 8 - 23 mg/dL   Creatinine, Ser 4.09 (H) 0.61 - 1.24 mg/dL   Calcium >81.1 (HH) 8.9 - 10.3 mg/dL    Comment: CRITICAL RESULT CALLED TO, READ BACK BY AND  VERIFIED WITH HANNIE VAN, RN @ 1815 09/25/22 BY SEKDAHL   GFR, Estimated 15 (L) >60 mL/min    Comment: (NOTE) Calculated using the CKD-EPI Creatinine Equation (2021)    Anion gap 11 5 - 15    Comment: Performed at [facility], [address] [place], [place] [address]  CBC with Differential     Status: Abnormal   Collection Time: 09/25/22  4:15 PM  Result Value Ref Range   WBC 5.6 4.0 - 10.5 K/uL   RBC 2.81 (L) 4.22 - 5.81 MIL/uL   Hemoglobin 7.8 (L) 13.0 - 17.0 g/dL   HCT 29.5 (L) 62.1 - 30.8 %   MCV 89.3 80.0 - 100.0 fL   MCH 27.8 26.0 - 34.0 pg   MCHC 31.1 30.0 - 36.0 g/dL   RDW 65.7 (H) 84.6 - 96.2 %   Platelets 208 150 - 400 K/uL   nRBC 0.5 (H) 0.0 - 0.2 %   Neutrophils Relative % 45 %   Neutro Abs 2.6 1.7 - 7.7 K/uL   Lymphocytes Relative 38 %   Lymphs Abs 2.1 0.7 - 4.0 K/uL   Monocytes Relative 14 %   Monocytes Absolute 0.8 0.1 - 1.0 K/uL   Eosinophils Relative 1 %   Eosinophils Absolute 0.1 0.0 - 0.5 K/uL   Basophils Relative 1 %   Basophils Absolute 0.0 0.0 - 0.1 K/uL   Immature Granulocytes 1 %   Abs Immature Granulocytes 0.08 (H) 0.00 - 0.07 K/uL    Comment: Performed at [facility], [address], [place], [place] [address]  Magnesium     Status: Abnormal   Collection Time: 09/25/22  4:15 PM  Result Value Ref Range   Magnesium 1.4 (L) 1.7 - 2.4 mg/dL    Comment: Performed at [facility], [address] [place], [place] [address]  TSH     Status: None   Collection Time: 09/25/22  7:54 PM  Result Value Ref Range   TSH 0.619 0.350 - 4.500 uIU/mL    Comment: Performed by a 3rd Generation assay with a functional sensitivity of <=0.01 uIU/mL. Performed at [facility], [address] [place], [place] [address]   Urinalysis, Routine w reflex microscopic -Urine, Clean Catch     Status: Abnormal   Collection Time: 09/25/22  8:39 PM  Result Value Ref Range   Color, Urine STRAW (A) YELLOW   APPearance CLEAR CLEAR   Specific Gravity,  Urine 1.005 1.005 - 1.030   pH 5.0 5.0 - 8.0   Glucose, UA NEGATIVE NEGATIVE mg/dL   Hgb urine dipstick MODERATE (A) NEGATIVE   Bilirubin Urine NEGATIVE NEGATIVE   Ketones, ur NEGATIVE NEGATIVE mg/dL   Protein, ur NEGATIVE NEGATIVE  mg/dL   Nitrite NEGATIVE NEGATIVE   Leukocytes,Ua NEGATIVE NEGATIVE   RBC / HPF 0-5 0 - 5 RBC/hpf   WBC, UA 0-5 0 - 5 WBC/hpf   Bacteria, UA RARE (A) NONE SEEN   Squamous Epithelial / HPF 0-5 0 - 5 /HPF   Mucus PRESENT     Comment: Performed at [facility], [address] [place], [place] [address]  VITAMIN D 25 Hydroxy (Vit-D Deficiency, Fractures)     Status: None   Collection Time: 09/25/22 10:04 PM  Result Value Ref Range   Vit D, 25-Hydroxy 54.04 30 - 100 ng/mL    Comment: (NOTE) Vitamin D deficiency has been defined by the Institute of Medicine  and an Endocrine Society practice guideline as a level of serum 25-OH  vitamin D less than 20 ng/mL (1,2). The Endocrine Society went on to  further define vitamin D insufficiency as a level between 21 and 29  ng/mL (2).  1. IOM (Institute of Medicine). 2010. Dietary reference intakes for  calcium and D. [place]: [employer]. 2. Holick MF, Binkley , Bischoff-Ferrari HA, et al. Evaluation,  treatment, and prevention of vitamin D deficiency: an Endocrine  Society clinical practice guideline, JCEM. 2011 Jul; 96(7): 1911-30.  Performed at [facility], [address], [place], [place] [address]   Basic metabolic panel     Status: Abnormal   Collection Time: 09/25/22 10:04 PM  Result Value Ref Range   Sodium 139 135 - 145 mmol/L   Potassium 3.9 3.5 - 5.1 mmol/L   Chloride 108 98 - 111 mmol/L   CO2 20 (L) 22 - 32 mmol/L   Glucose, Bld 129 (H) 70 - 99 mg/dL    Comment: Glucose reference range applies only to samples taken after fasting for at least 8 hours.   BUN 37 (H) 8 - 23 mg/dL   Creatinine, Ser 0.98 (H) 0.61 - 1.24 mg/dL   Calcium 11.9 (HH) 8.9 - 10.3 mg/dL     Comment: CRITICAL RESULT CALLED TO, READ BACK BY AND VERIFIED WITH SHANNON WALKER RN 09/25/22 2314 M KOROLESKI   GFR, Estimated 18 (L) >60 mL/min    Comment: (NOTE) Calculated using the CKD-EPI Creatinine Equation (2021)    Anion gap 11 5 - 15    Comment: Performed at [facility], [address], [place], [place] [address]  Phosphorus     Status: Abnormal   Collection Time: 09/25/22 10:04 PM  Result Value Ref Range   Phosphorus >30.0 (H) 2.5 - 4.6 mg/dL    Comment: RESULTS CONFIRMED BY MANUAL DILUTION Performed at [facility], [address] [place], [place] [address]   CBG monitoring, ED     Status: Abnormal   Collection Time: 09/25/22 11:19 PM  Result Value Ref Range   Glucose-Capillary 125 (H) 70 - 99 mg/dL    Comment: Glucose reference range applies only to samples taken after fasting for at least 8 hours.   Comment 1 Notify RN    Comment 2 Document in Chart   Renal function panel     Status: Abnormal   Collection Time: 09/26/22  2:17 AM  Result Value Ref Range   Sodium 138 135 - 145 mmol/L   Potassium 3.5 3.5 - 5.1 mmol/L   Chloride 107 98 - 111 mmol/L   CO2 19 (L) 22 - 32 mmol/L   Glucose, Bld 154 (H) 70 - 99 mg/dL    Comment: Glucose  reference range applies only to samples taken after fasting for at least 8 hours.   BUN 34 (H) 8 - 23 mg/dL   Creatinine, Ser 1.61 (H) 0.61 - 1.24 mg/dL   Calcium 09.6 (HH) 8.9 - 10.3 mg/dL    Comment: CRITICAL RESULT CALLED TO, READ BACK BY AND VERIFIED WITH SHANNON WALKER RN 09/26/22 0301 M KOROLESKI   Phosphorus >30.0 (H) 2.5 - 4.6 mg/dL    Comment: RESULT CONFIRMED BY MANUAL DILUTION   Albumin 3.0 (L) 3.5 - 5.0 g/dL   GFR, Estimated 20 (L) >60 mL/min    Comment: (NOTE) Calculated using the CKD-EPI Creatinine Equation (2021)    Anion gap 12 5 - 15    Comment: Performed at [facility], [address], [place], [place] [address]  CBC     Status: Abnormal   Collection Time: 09/26/22  2:17 AM  Result Value Ref  Range   WBC 5.8 4.0 - 10.5 K/uL   RBC 2.72 (L) 4.22 - 5.81 MIL/uL   Hemoglobin 7.6 (L) 13.0 - 17.0 g/dL   HCT 98.1 (L) 19.1 - 47.8 %   MCV 87.9 80.0 - 100.0 fL   MCH 27.9 26.0 - 34.0 pg   MCHC 31.8 30.0 - 36.0 g/dL   RDW 29.5 (H) 62.1 - 30.8 %   Platelets 192 150 - 400 K/uL   nRBC 0.5 (H) 0.0 - 0.2 %    Comment: Performed at [facility], [address] [place], [place] [address]  Magnesium     Status: None   Collection Time: 09/26/22  2:17 AM  Result Value Ref Range   Magnesium 1.7 1.7 - 2.4 mg/dL    Comment: Performed at [facility], [address], [place], [place] [address]  Hemoglobin A1c     Status: Abnormal   Collection Time: 09/26/22  2:17 AM  Result Value Ref Range   Hgb A1c MFr Bld 7.3 (H) 4.8 - 5.6 %    Comment: (NOTE) Pre diabetes:          5.7%-6.4%  Diabetes:              >6.4%  Glycemic control for   <7.0% adults with diabetes    Mean Plasma Glucose 162.81 mg/dL    Comment: Performed at [facility], [address], [place], [place] [address]  Iron and TIBC     Status: Abnormal   Collection Time: 09/26/22  5:24 AM  Result Value Ref Range   Iron 50 45 - 182 ug/dL   TIBC 324 401 - 027 ug/dL   Saturation Ratios 14 (L) 17.9 - 39.5 %   UIBC 307 ug/dL    Comment: Performed at [facility], [address], [place], [place] [address]  Ferritin     Status: None   Collection Time: 09/26/22  5:24 AM  Result Value Ref Range   Ferritin 287 24 - 336 ng/mL    Comment: Performed at [facility], [address] [place], [place] [address]  CBG monitoring, ED     Status: Abnormal   Collection Time: 09/26/22  7:19 AM  Result Value Ref Range   Glucose-Capillary 150 (H) 70 - 99 mg/dL    Comment: Glucose reference range applies only to samples taken after fasting for at least 8 hours.   CT CHEST WO CONTRAST  Result Date: 09/26/2022 CLINICAL DATA:  Abnormal xray - lung opacity/opacities EXAM: CT CHEST WITHOUT CONTRAST TECHNIQUE:  Multidetector CT imaging of the chest was performed following the standard protocol without IV contrast. RADIATION DOSE REDUCTION: This exam was performed according to the departmental dose-optimization program which includes automated exposure control, adjustment of the mA and/or kV according to patient size and/or use of iterative reconstruction technique. COMPARISON:  Chest x-ray 09/08/2022 FINDINGS: Cardiovascular: Heart is normal size. Aorta is normal caliber. Aortic atherosclerosis. Mediastinum/Nodes: No mediastinal, hilar, or axillary adenopathy. Trachea and esophagus are unremarkable. Thyroid unremarkable. Lungs/Pleura: No confluent airspace opacities or effusions. No pulmonary nodules or masses. Upper Abdomen: No acute findings Musculoskeletal: Chest wall soft tissues are unremarkable. The density seen on prior chest x-ray reflects and anterior right 2nd rib lesion with extension into the adjacent soft tissues and protrusion into the right upper lung. This measures 4 cm in greatest diameter. There are innumerable lytic lesions throughout the entire visible osseous structures occluding thoracic spine, all ribs, scapulae bilaterally, proximal humeri, clavicles and sternum. IMPRESSION: Abnormality seen on chest x-ray reflects bone destruction and soft tissue mass extending from the anterior right 2nd rib. Innumerable diffuse lytic lesions throughout the visualized bony skeleton. Findings are compatible with metastases or myeloma. Recommend clinical correlation. Aortic Atherosclerosis (ICD10-I70.0). Electronically Signed   By: Charlett Nose M.D.   On: 09/26/2022 09:15   US RENAL  Result Date: 09/26/2022 CLINICAL DATA:  Acute kidney injury EXAM: RENAL / URINARY TRACT ULTRASOUND COMPLETE COMPARISON:  None Available. FINDINGS: Right Kidney: Renal measurements: 10.6 x 5.1 x 5.8 cm = volume: 162 mL. Echogenicity within normal limits. No mass or hydronephrosis visualized. Left Kidney: Renal measurements: 10.4 x  6.3 x 6.7 cm = volume: 228 mL. Echogenicity within normal limits. No mass or hydronephrosis visualized. Bladder: Foley catheter balloon is seen within a decompressed bladder lumen. Other: None. IMPRESSION: 1. Normal renal sonogram. Electronically Signed   By: Helyn Numbers M.D.   On: 09/26/2022 00:07    Pending Labs Unresulted Labs (From admission, onward)     Start     Ordered   09/25/22 2230  PTH-related peptide  Once,   R        09/25/22 2230   09/25/22 2053  Protein electrophoresis, serum  Once,   URGENT        09/25/22 2052   09/25/22 2053  Kappa/lambda light chains  Once,   URGENT        09/25/22 2052   09/25/22 2052  Parathyroid hormone, intact (no Ca)  Once,   URGENT        09/25/22 2052   09/25/22 2052  Calcitriol (1,25 di-OH Vit D)  Once,   URGENT        09/25/22 2052   09/25/22 2051  Calcium, urine, random  Once,   URGENT        09/25/22 2052   09/25/22 2051  Creatinine, urine, random  Once,   URGENT        09/25/22 2052   09/25/22 1907  Calcium, ionized  ONCE - STAT,   STAT        09/25/22 1906            Vitals/Pain Today's Vitals   09/26/22 0630 09/26/22 0700 09/26/22 0721 09/26/22 1035  BP: 135/70 132/69  (!) 150/88  Pulse: 94 88  (!) 108  Resp: 17 16  17   Temp:   98.1 F (36.7 C)   TempSrc:   Oral   SpO2: 93% 95%  98%  Weight:      Height:  PainSc:        Isolation Precautions No active isolations  Medications Medications  0.9 %  sodium chloride infusion ( Intravenous New Bag/Given 09/26/22 0400)  calcitonin (MIACALCIN) injection 350 Units (350 Units Subcutaneous Given 09/25/22 2215)  heparin injection 5,000 Units (5,000 Units Subcutaneous Given 09/26/22 0539)  acetaminophen (TYLENOL) tablet 650 mg (has no administration in time range)  oxyCODONE (Oxy IR/ROXICODONE) immediate release tablet 5 mg (has no administration in time range)  HYDROmorphone (DILAUDID) injection 0.5 mg (has no administration in time range)  polyethylene glycol (MIRALAX /  GLYCOLAX) packet 17 g (has no administration in time range)  aspirin EC tablet 81 mg (has no administration in time range)  levothyroxine (SYNTHROID) tablet 150 mcg (150 mcg Oral Given 09/26/22 0539)  rosuvastatin (CRESTOR) tablet 10 mg (has no administration in time range)  insulin aspart (novoLOG) injection 0-9 Units (1 Units Subcutaneous Given 09/26/22 0724)  insulin aspart (novoLOG) injection 0-5 Units ( Subcutaneous Not Given 09/25/22 2320)  prochlorperazine (COMPAZINE) injection 5 mg (5 mg Intravenous Given 09/25/22 2336)  latanoprost (XALATAN) 0.005 % ophthalmic solution 1 drop (has no administration in time range)  brimonidine (ALPHAGAN) 0.15 % ophthalmic solution 1 drop (has no administration in time range)  sodium chloride 0.9 % bolus 1,000 mL (0 mLs Intravenous Stopped 09/25/22 2227)  magnesium sulfate IVPB 2 g 50 mL (0 g Intravenous Stopped 09/25/22 2219)  sodium chloride 0.9 % bolus 1,000 mL (0 mLs Intravenous Stopped 09/25/22 2227)  furosemide (LASIX) injection 40 mg (40 mg Intravenous Given 09/25/22 1956)  potassium chloride 10 mEq in 100 mL IVPB (0 mEq Intravenous Stopped 09/26/22 0534)    Mobility walks     Focused Assessments Cardiac Assessment Handoff:  Cardiac Rhythm: Sinus tachycardia No results found for: "CKTOTAL", "CKMB", "CKMBINDEX", "TROPONINI" No results found for: "DDIMER" Does the Patient currently have chest pain? No   , Neuro Assessment Handoff:  Swallow screen pass?  Na Cardiac Rhythm: Sinus tachycardia       Neuro Assessment:   Neuro Checks:      Has TPA been given? No If patient is a Neuro Trauma and patient is going to OR before floor call report to 4N Charge nurse: [phone] or [phone]   R Recommendations: See Admitting Provider Note  Report given to:   Additional Notes: na

## 2022-09-26 NOTE — Progress Notes (Signed)
[facility] Progress Note   72 y.o. male with HTN, hypothyroidism, OSA, DM, chronic back pain who was sent from doctor's office for abnormal lab results seen as a consultation for the evaluation and management of hypercalcemia.  The patient stated he has generalized body pain including hip pain, chronic back pain. Cr level 4.14, BUN 41, calcium level more than 15, magnesium level 1.4, hemoglobin 7.8.  The previous labs from 09/08/2022 with creatinine level of 1.46 and a calcium level was 11 at that time.  The home medication include amlodipine, Jardiance, hydralazine, Synthroid, losartan, metformin, metoprolol, statin and Aldactone.  Patient denies intake of vitamin D, Tums or any calcium supplement.  The chest x-ray showed 2.5 cm opacity in the right upper lobe lung and mild cardiomegaly.  He is legally blind; wife was at the bedside.      Assessment/ Plan:   1) Severe hypercalcemia: The calcium level is >15.  From the history and the medication list no intake of vitamin D or oral calcium supplement noted.  He already received 2 L of IV fluid in the ER, Continue maintenance IV fluid NS at 150 cc an hour -> he has good U OP. Vit D level NL.  We will try to manage hypercalcemia medically with IV fluid, calcitonin and diuretics if needed.  Try to avoid dialysis if possible in this elderly male.  We will start the workup of hypercalcemia, may need to rule out underlying malignancy because the chest x-ray showing right lung opacity.  Check labs including PTH, PTH RP, urine calcium and creatinine level, SPEP and free light chain.   Will given calcitonin again tomorrow; if Ca not decreasing trend tomorrow then will consider zometa in the next 24-48hrs.  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  Checking urine, bladder scan.  I will insert Foley catheter for strict ins and out.  He is having difficulty urinating in the  urinal.  Order kidney ultrasound to rule out obstruction. Recommend to hold Jardiance, losartan, Aldactone and metformin. Continue fluid and management of hypercalcemia as above.  Slight improvement and will trend.  3) Hypokalemia: Repleted potassium chloride.  Monitor lab.   4) Hypomagnesemia: Got magnesium, follow labs.   5) Anemia: Further workup per primary team, recommend checking stool blood test etc. Iron deficient and will replete.   6) Hypertension: Blood pressure acceptable on arrival.  Can resume Norvasc, hydralazine, metoprolol and monitor BP. Subjective:   C/o pain in the left leg; denies f/c/n/v/sob/cp   Objective:   BP (!) 161/81 (BP Location: Left Arm)   Pulse (!) 102   Temp 97.7 F (36.5 C) (Oral)   Resp 18   Ht 5\' 6"  (1.676 m)   Wt 87.6 kg   SpO2 97%   BMI 31.17 kg/m   Intake/Output Summary (Last 24 hours) at 09/26/2022 1420 Last data filed at 09/26/2022 1035 Gross per 24 hour  Intake 2520 ml  Output 3650 ml  Net -1130 ml   Weight change:   Physical Exam: General exam: Appears comfortable in NAD Respiratory system: Clear to auscultation Cardiovascular system: S1 & S2 heard, RRR.  No pedal edema. Gastrointestinal system: Abdomen is SNDNT +BS Extremities: No edema. Skin: No rashes, lesions or ulcers  Imaging: CT CHEST WO CONTRAST  Result Date: 09/26/2022 CLINICAL DATA:  Abnormal xray - lung opacity/opacities EXAM: CT CHEST WITHOUT CONTRAST TECHNIQUE: Multidetector CT imaging of the chest was performed following the  standard protocol without IV contrast. RADIATION DOSE REDUCTION: This exam was performed according to the departmental dose-optimization program which includes automated exposure control, adjustment of the mA and/or kV according to patient size and/or use of iterative reconstruction technique. COMPARISON:  Chest x-ray 09/08/2022 FINDINGS: Cardiovascular: Heart is normal size. Aorta is normal caliber. Aortic atherosclerosis. Mediastinum/Nodes: No  mediastinal, hilar, or axillary adenopathy. Trachea and esophagus are unremarkable. Thyroid unremarkable. Lungs/Pleura: No confluent airspace opacities or effusions. No pulmonary nodules or masses. Upper Abdomen: No acute findings Musculoskeletal: Chest wall soft tissues are unremarkable. The density seen on prior chest x-ray reflects and anterior right 2nd rib lesion with extension into the adjacent soft tissues and protrusion into the right upper lung. This measures 4 cm in greatest diameter. There are innumerable lytic lesions throughout the entire visible osseous structures occluding thoracic spine, all ribs, scapulae bilaterally, proximal humeri, clavicles and sternum. IMPRESSION: Abnormality seen on chest x-ray reflects bone destruction and soft tissue mass extending from the anterior right 2nd rib. Innumerable diffuse lytic lesions throughout the visualized bony skeleton. Findings are compatible with metastases or myeloma. Recommend clinical correlation. Aortic Atherosclerosis (ICD10-I70.0). Electronically Signed   By: Charlett Nose M.D.   On: 09/26/2022 09:15   US RENAL  Result Date: 09/26/2022 CLINICAL DATA:  Acute kidney injury EXAM: RENAL / URINARY TRACT ULTRASOUND COMPLETE COMPARISON:  None Available. FINDINGS: Right Kidney: Renal measurements: 10.6 x 5.1 x 5.8 cm = volume: 162 mL. Echogenicity within normal limits. No mass or hydronephrosis visualized. Left Kidney: Renal measurements: 10.4 x 6.3 x 6.7 cm = volume: 228 mL. Echogenicity within normal limits. No mass or hydronephrosis visualized. Bladder: Foley catheter balloon is seen within a decompressed bladder lumen. Other: None. IMPRESSION: 1. Normal renal sonogram. Electronically Signed   By: Helyn Numbers M.D.   On: 09/26/2022 00:07    Labs: BMET Recent Labs  Lab 09/25/22 1615 09/25/22 2204 09/26/22 0217  NA 137 139 138  K 3.4* 3.9 3.5  CL 105 108 107  CO2 21* 20* 19*  GLUCOSE 148* 129* 154*  BUN 41* 37* 34*  CREATININE 4.14*  3.42* 3.24*  CALCIUM >15.0* 14.2* 13.5*  PHOS  --  >30.0* >30.0*   CBC Recent Labs  Lab 09/25/22 1615 09/26/22 0217  WBC 5.6 5.8  NEUTROABS 2.6  --   HGB 7.8* 7.6*  HCT 25.1* 23.9*  MCV 89.3 87.9  PLT 208 192    Medications:     aspirin EC  81 mg Oral Daily   brimonidine  1 drop Both Eyes TID   Chlorhexidine Gluconate Cloth  6 each Topical Daily   heparin  5,000 Units Subcutaneous Q8H   insulin aspart  0-5 Units Subcutaneous QHS   insulin aspart  0-9 Units Subcutaneous TID WC   latanoprost  1 drop Both Eyes QHS   levothyroxine  150 mcg Oral Daily   rosuvastatin  10 mg Oral Daily      Paulene Floor, MD 09/26/2022, 2:20 PM

## 2022-09-27 DIAGNOSIS — N179 Acute kidney failure, unspecified: Secondary | ICD-10-CM

## 2022-09-27 DIAGNOSIS — D649 Anemia, unspecified: Secondary | ICD-10-CM | POA: Diagnosis not present

## 2022-09-27 DIAGNOSIS — I10 Essential (primary) hypertension: Secondary | ICD-10-CM

## 2022-09-27 LAB — GLUCOSE, CAPILLARY
Glucose-Capillary: 98 mg/dL (ref 70–99)
Glucose-Capillary: 110 mg/dL — ABNORMAL HIGH (ref 70–99)
Glucose-Capillary: 113 mg/dL — ABNORMAL HIGH (ref 70–99)
Glucose-Capillary: 155 mg/dL — ABNORMAL HIGH (ref 70–99)

## 2022-09-27 LAB — CALCITRIOL (1,25 DI-OH VIT D): Vit D, 1,25-Dihydroxy: 9.1 pg/mL — ABNORMAL LOW (ref 24.8–81.5)

## 2022-09-27 LAB — CALCIUM, IONIZED: Calcium, Ionized, Serum: 8.1 mg/dL — ABNORMAL HIGH (ref 4.5–5.6)

## 2022-09-27 LAB — BASIC METABOLIC PANEL
Anion gap: 6 (ref 5–15)
BUN: 21 mg/dL (ref 8–23)
CO2: 18 mmol/L — ABNORMAL LOW (ref 22–32)
Calcium: 10.4 mg/dL — ABNORMAL HIGH (ref 8.9–10.3)
Chloride: 111 mmol/L (ref 98–111)
Creatinine, Ser: 2.24 mg/dL — ABNORMAL HIGH (ref 0.61–1.24)
GFR, Estimated: 31 mL/min — ABNORMAL LOW (ref >60)
Glucose, Bld: 110 mg/dL — ABNORMAL HIGH (ref 70–99)
Potassium: 3.1 mmol/L — ABNORMAL LOW (ref 3.5–5.1)
Sodium: 135 mmol/L (ref 135–145)

## 2022-09-27 LAB — KAPPA/LAMBDA LIGHT CHAINS
Kappa free light chain: 183.9 mg/L — ABNORMAL HIGH (ref 3.3–19.4)
Kappa, lambda light chain ratio: 20.43 — ABNORMAL HIGH (ref 0.26–1.65)
Lambda free light chains: 9 mg/L (ref 5.7–26.3)

## 2022-09-27 LAB — PARATHYROID HORMONE, INTACT (NO CA): PTH: 8 pg/mL — ABNORMAL LOW (ref 15–65)

## 2022-09-27 MED ORDER — POTASSIUM CHLORIDE CRYS ER 20 MEQ PO TBCR
40.0000 meq | EXTENDED_RELEASE_TABLET | Freq: Once | ORAL | Status: AC
  Administered 2022-09-27: 40 meq via ORAL
  Filled 2022-09-27: qty 2

## 2022-09-27 NOTE — Progress Notes (Addendum)
[facility] Progress Note   72 y.o. male with HTN, hypothyroidism, OSA, DM, chronic back pain who was sent from doctor's office for abnormal lab results seen as a consultation for the evaluation and management of hypercalcemia.  The patient stated he has generalized body pain including hip pain, chronic back pain. Cr level 4.14, BUN 41, calcium level more than 15, magnesium level 1.4, hemoglobin 7.8.  The previous labs from 09/08/2022 with creatinine level of 1.46 and a calcium level was 11 at that time.  The home medication include amlodipine, Jardiance, hydralazine, Synthroid, losartan, metformin, metoprolol, statin and Aldactone.  Patient denies intake of vitamin D, Tums or any calcium supplement.  The chest x-ray showed 2.5 cm opacity in the right upper lobe lung and mild cardiomegaly.  He is legally blind; wife was at the bedside.      Assessment/ Plan:   1) Severe hypercalcemia: The calcium level is >15.  From the history and the medication list no intake of vitamin D or oral calcium supplement noted.  He already received 2 L of IV fluid in the ER, Continue maintenance IV fluid NS at 150 cc an hour -> he has good U OP. Vit D level NL.  We will try to manage hypercalcemia medically with IV fluid, calcitonin and diuretics if needed.    Looking like malignancy as cause of the hypercalcemia which is usually the case when the Ca is this high. Chest x-ray showing right lung opacity. CT shows bone destruction and soft tissue mass extending from the anterior right 2nd rib. Innumerable diffuse lytic lesions throughout the visualized bony skeleton.    Check labs including PTH, PTHrP,  SPEP and free light chain.   Will add zometa tomorrow as he's getting last doses of calcitonin today.  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  Checking urine, bladder scan.  I will insert Foley catheter for strict ins and out.  He  is having difficulty urinating in the urinal.  Order kidney ultrasound to rule out obstruction.  Recommend holding Jardiance, losartan, Aldactone and metformin. Continue fluid and management of hypercalcemia as above.  Continued improvement and will trend.  3) Hypokalemia: Repleted potassium chloride.  Monitor lab.   4) Hypomagnesemia: Got magnesium, follow labs.   5) Anemia: Further workup per primary team, recommend checking stool blood test etc. Iron deficient and will replete.   6) Hypertension: Blood pressure acceptable on arrival.  Can resume Norvasc, hydralazine, metoprolol and monitor BP. Subjective:   C/o pain in the left leg; denies f/c/n/v/sob/cp   Objective:   BP (!) 144/80 (BP Location: Left Wrist)   Pulse 97   Temp 98.5 F (36.9 C) (Oral)   Resp 17   Ht 5\' 6"  (1.676 m)   Wt 87.6 kg   SpO2 96%   BMI 31.17 kg/m  No intake or output data in the 24 hours ending 09/27/22 1302  Weight change:   Physical Exam: General exam: Appears comfortable in NAD Respiratory system: Clear to auscultation Cardiovascular system: S1 & S2 heard, RRR.  No pedal edema. Gastrointestinal system: Abdomen is SNDNT +BS Extremities: No edema. Skin: No rashes, lesions or ulcers  Imaging: CT CHEST WO CONTRAST  Result Date: 09/26/2022 CLINICAL DATA:  Abnormal xray - lung opacity/opacities EXAM: CT CHEST WITHOUT CONTRAST TECHNIQUE: Multidetector CT imaging of the chest was performed following the standard protocol without IV contrast. RADIATION DOSE REDUCTION: This exam was performed according  to the departmental dose-optimization program which includes automated exposure control, adjustment of the mA and/or kV according to patient size and/or use of iterative reconstruction technique. COMPARISON:  Chest x-ray 09/08/2022 FINDINGS: Cardiovascular: Heart is normal size. Aorta is normal caliber. Aortic atherosclerosis. Mediastinum/Nodes: No mediastinal, hilar, or axillary adenopathy. Trachea and  esophagus are unremarkable. Thyroid unremarkable. Lungs/Pleura: No confluent airspace opacities or effusions. No pulmonary nodules or masses. Upper Abdomen: No acute findings Musculoskeletal: Chest wall soft tissues are unremarkable. The density seen on prior chest x-ray reflects and anterior right 2nd rib lesion with extension into the adjacent soft tissues and protrusion into the right upper lung. This measures 4 cm in greatest diameter. There are innumerable lytic lesions throughout the entire visible osseous structures occluding thoracic spine, all ribs, scapulae bilaterally, proximal humeri, clavicles and sternum. IMPRESSION: Abnormality seen on chest x-ray reflects bone destruction and soft tissue mass extending from the anterior right 2nd rib. Innumerable diffuse lytic lesions throughout the visualized bony skeleton. Findings are compatible with metastases or myeloma. Recommend clinical correlation. Aortic Atherosclerosis (ICD10-I70.0). Electronically Signed   By: Charlett Nose M.D.   On: 09/26/2022 09:15   US RENAL  Result Date: 09/26/2022 CLINICAL DATA:  Acute kidney injury EXAM: RENAL / URINARY TRACT ULTRASOUND COMPLETE COMPARISON:  None Available. FINDINGS: Right Kidney: Renal measurements: 10.6 x 5.1 x 5.8 cm = volume: 162 mL. Echogenicity within normal limits. No mass or hydronephrosis visualized. Left Kidney: Renal measurements: 10.4 x 6.3 x 6.7 cm = volume: 228 mL. Echogenicity within normal limits. No mass or hydronephrosis visualized. Bladder: Foley catheter balloon is seen within a decompressed bladder lumen. Other: None. IMPRESSION: 1. Normal renal sonogram. Electronically Signed   By: Helyn Numbers M.D.   On: 09/26/2022 00:07    Labs: BMET Recent Labs  Lab 09/25/22 1615 09/25/22 2204 09/26/22 0217 09/27/22 0358  NA 137 139 138 135  K 3.4* 3.9 3.5 3.1*  CL 105 108 107 111  CO2 21* 20* 19* 18*  GLUCOSE 148* 129* 154* 110*  BUN 41* 37* 34* 21  CREATININE 4.14* 3.42* 3.24* 2.24*   CALCIUM >15.0* 14.2* 13.5* 10.4*  PHOS  --  >30.0* >30.0*  --    CBC Recent Labs  Lab 09/25/22 1615 09/26/22 0217  WBC 5.6 5.8  NEUTROABS 2.6  --   HGB 7.8* 7.6*  HCT 25.1* 23.9*  MCV 89.3 87.9  PLT 208 192    Medications:     aspirin EC  81 mg Oral Daily   brimonidine  1 drop Both Eyes TID   calcitonin  4 Units/kg Subcutaneous BID   Chlorhexidine Gluconate Cloth  6 each Topical Daily   heparin  5,000 Units Subcutaneous Q8H   insulin aspart  0-5 Units Subcutaneous QHS   insulin aspart  0-9 Units Subcutaneous TID WC   latanoprost  1 drop Both Eyes QHS   levothyroxine  150 mcg Oral Daily   rosuvastatin  10 mg Oral Daily      Paulene Floor, MD 09/27/2022, 1:02 PM

## 2022-09-27 NOTE — Evaluation (Addendum)
Occupational Therapy Evaluation Patient Details Name: Scott Bass MRN: [id number] DOB: March 14, 1951 Today's Date: 09/27/2022   History of Present Illness Scott Bass is a 72 y.o. male who presents with hypercalcemia, generalized body pain including hip and chronic back pain and n/v. CHest x-ray revealed 2.5cm opacity in R upper LOB lung and mild cardiomeglaly. 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 Pt requires moderate assistance for LB dressing. He requires completion of ALD while seated 2/2 instability and loss of balance while standing. Pt currently requires moderate assistance with stand-pivot transfer from recliner to EOB. Pt noted to have 2x posterior loss of balance during transfer, HR up to 122bpm SpO2 >98% RA. Pt functioning below baseline secondary to instability, decreased activity tolerance, and decreased cardiopulmonary function. Patient will benefit from continued inpatient follow up therapy, <3 hours/day Will continue to follow acutely.       Recommendations for follow up therapy are one component of a multi-disciplinary discharge planning process, led by the attending physician.  Recommendations may be updated based on patient status, additional functional criteria and insurance authorization.   Assistance Recommended at Discharge Intermittent Supervision/Assistance  Patient can return home with the following A lot of help with walking and/or transfers;A lot of help with bathing/dressing/bathroom;Assistance with cooking/housework;Assistance with feeding    Functional Status Assessment  Patient has had a recent decline in their functional status and demonstrates the ability to make significant  improvements in function in a reasonable and predictable amount of time.  Equipment Recommendations  Tub/shower seat;BSC/3in1    Recommendations for Other Services       Precautions / Restrictions Precautions Precautions: Fall Precaution Comments: legally blind Restrictions Weight Bearing Restrictions: No      Mobility Bed Mobility Overal bed mobility: Needs Assistance Bed Mobility: Sit to Supine       Sit to supine: Min guard   General bed mobility comments: vc for directions, pt then able to self adjust    Transfers Overall transfer level: Needs assistance Equipment used: 1 person hand held assist Transfers: Sit to/from Stand, Bed to chair/wheelchair/BSC Sit to Stand: Mod assist Stand pivot transfers: Mod assist         General transfer comment: demonstrated safe use of RW, pt requiring min vc following for safe use of RW. During stand pivot transfer pt with 2x posterior loss of balance requiring assistance to correct. Pt with instability throughout and unable to safely progress away from the recliner/bed      Balance Overall balance assessment: Needs assistance Sitting-balance support: Feet supported, No upper extremity supported Sitting balance-Leahy Scale: Good Sitting balance - Comments: good statically, requires unilateral UE support for dynamic balance   Standing balance support: Reliant on assistive device for balance, During functional activity, Bilateral upper extremity supported Standing balance-Leahy Scale: Poor Standing balance comment: heavy reliance on BUE support                           ADL either performed or assessed with clinical judgement   ADL Overall ADL's : Needs assistance/impaired Eating/Feeding: Independent   Grooming: Set up;Sitting   Upper Body Bathing: Set up;Sitting   Lower Body Bathing: Minimal assistance;Sit to/from stand   Upper Body Dressing : Set  up;Sitting   Lower Body Dressing: Moderate assistance;Sit  to/from stand   Toilet Transfer: Moderate assistance;Stand-pivot Toilet Transfer Details (indicate cue type and reason): simulated from recliner to bed Toileting- Clothing Manipulation and Hygiene: Moderate assistance;Sit to/from stand       Functional mobility during ADLs: Moderate assistance;Rolling walker (2 wheels) General ADL Comments: pt limited by pain in LLE, instability, decreased activity tolerance, posterior loss of balance x2 with stand pivot transfer     Vision Baseline Vision/History: 2 Legally blind Ability to See in Adequate Light: 3 Highly impaired Patient Visual Report: No change from baseline Vision Assessment?:  (vision is at baseline, pt reports he can see up close, but distance is poor)     Perception     Praxis      Pertinent Vitals/Pain Pain Assessment Pain Assessment: 0-10 Pain Score: 5  Pain Location: LLE Pain Descriptors / Indicators: Sore, Constant, Grimacing Pain Intervention(s): Monitored during session     Hand Dominance Right   Extremity/Trunk Assessment Upper Extremity Assessment Upper Extremity Assessment: Overall WFL for tasks assessed   Lower Extremity Assessment Lower Extremity Assessment: Defer to PT evaluation LLE Deficits / Details: generalized weakness due to onset of L LE pain with manual resistance, pain also limited ability to WB on L LE   Cervical / Trunk Assessment Cervical / Trunk Assessment: Normal   Communication Communication Communication: No difficulties   Cognition Arousal/Alertness: Awake/alert Behavior During Therapy: WFL for tasks assessed/performed Overall Cognitive Status: Within Functional Limits for tasks assessed                                       General Comments  HR elevated with functional mobility HR 122bpm with stand-pivot transfer, SpO2 >98% on RA    Exercises     Shoulder Instructions      Home Living Family/patient expects to be discharged to:: Private residence Living  Arrangements: Spouse/significant other Available Help at Discharge: Family;Available 24 hours/day (wife and pt retired) Type of Home: House Home Access: Level entry     Home Layout: One level     Bathroom Shower/Tub: Chief Strategy Officer: Standard     Home Equipment: Medical laboratory scientific officer - quad          Prior Functioning/Environment Prior Level of Function : Independent/Modified Independent             Mobility Comments: indep, uses cane occasionally, reports difficulty getting down and up off commode due to low surface height ADLs Comments: pt reports independence, doesn't drive anymore and doesn't help as much with the cooking        OT Problem List: Decreased activity tolerance;Impaired balance (sitting and/or standing);Decreased safety awareness;Impaired vision/perception;Decreased knowledge of use of DME or AE      OT Treatment/Interventions: Self-care/ADL training;Therapeutic exercise;Energy conservation;DME and/or AE instruction;Therapeutic activities;Patient/family education;Balance training    OT Goals(Current goals can be found in the care plan section) Acute Rehab OT Goals Patient Stated Goal: to get back to baseline OT Goal Formulation: With patient Time For Goal Achievement: 10/11/22 Potential to Achieve Goals: Good ADL Goals Pt Will Perform Grooming: with modified independence;standing Pt Will Perform Lower Body Dressing: with modified independence;sit to/from stand Pt Will Transfer to Toilet: with modified independence;ambulating Additional ADL Goal #1: Pt will demonstrate independence with 3 fall prevention strategies.  OT Frequency: Min 2X/week    Co-evaluation  AM-PAC OT "6 Clicks" Daily Activity     Outcome Measure Help from another person eating meals?: A Little Help from another person taking care of personal grooming?: A Little Help from another person toileting, which includes using toliet, bedpan, or urinal?: A Lot Help  from another person bathing (including washing, rinsing, drying)?: A Little Help from another person to put on and taking off regular upper body clothing?: A Little Help from another person to put on and taking off regular lower body clothing?: A Lot 6 Click Score: 16   End of Session Equipment Utilized During Treatment: Gait belt;Rolling walker (2 wheels) Nurse Communication: Mobility status  Activity Tolerance: Patient tolerated treatment well Patient left: in bed;with call bell/phone within reach;with bed alarm set;with family/visitor present  OT Visit Diagnosis: Other abnormalities of gait and mobility (R26.89);Muscle weakness (generalized) (M62.81)                Time: 2440-1027 OT Time Calculation (min): 39 min Charges:  OT General Charges $OT Visit: 1 Visit OT Evaluation $OT Eval Moderate Complexity: 1 Mod OT Treatments $Self Care/Home Management : 23-37 mins  Rosey Bath OTR/L Acute Rehabilitation Services Office: [phone]   Rebeca Alert 09/27/2022, 3:32 PM

## 2022-09-27 NOTE — Evaluation (Signed)
Physical Therapy Evaluation Patient Details Name: Scott Bass MRN: [id number] DOB: Mar 02, 1951 Today's Date: 09/27/2022  History of Present Illness  Scott Bass is a 72 y.o. male who presents with hypercalcemia, generalized body pain including hip and chronic back pain and n/v. CHest x-ray revealed 2.5cm opacity in R upper LOB lung and mild cardiomeglaly. 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 Aware pt undergoing further medical test however pt mobility currently most limited by L LE pain. Pt is indep with use of SPC PTA. Pt now requiring modA and use of RW to stand up and transfer to chair due to intense L LE pain. Suspect once pain managed pt with progress well enough to be able to return home with spouse with use of RW and HH services. Acute PT to cont to follow and re-assess d/c recommendations as pt continues medical work up.       Recommendations for follow up therapy are one component of a multi-disciplinary discharge planning process, led by the attending physician.  Recommendations may be updated based on patient status, additional functional criteria and insurance authorization.  Follow Up Recommendations       Assistance Recommended at Discharge Frequent or constant Supervision/Assistance  Patient can return home with the following  A lot of help with walking and/or transfers;A lot of help with bathing/dressing/bathroom;Assist for transportation    Equipment Recommendations Rolling walker (2 wheels);BSC/3in1  Recommendations for Other Services       Functional Status Assessment Patient has had a recent decline in their functional status and demonstrates the ability to make significant improvements in function in a reasonable and predictable amount of time.     Precautions / Restrictions Precautions Precautions:  Fall Precaution Comments: legally blind Restrictions Weight Bearing Restrictions: No      Mobility  Bed Mobility Overal bed mobility: Needs Assistance Bed Mobility: Rolling, Sidelying to Sit Rolling: Min assist Sidelying to sit: Min assist, HOB elevated       General bed mobility comments: verbal directional cues, tactile cues to bring L UE across to use bedrail to complete rolling to the R. minA for trunk elevation due to onset of pain with movement    Transfers Overall transfer level: Needs assistance Equipment used: 1 person hand held assist Transfers: Sit to/from Stand, Bed to chair/wheelchair/BSC Sit to Stand: Mod assist   Step pivot transfers: Mod assist       General transfer comment: pt unable to use cane this date and no walker in room. Pt with limited abiltiy to WB on L LE. Pt provided with L HHA and modA to power up with increased weight bearing on R LE, pt completed step to gait pattern to take 5 steps to chair using R UE on arm rest of the chair. Pt wiht increased L LE pain. pt to benefit from walker and will ensure to use one next session    Ambulation/Gait               General Gait Details: unable due to L LE pain and inability to [employer]  Stairs            Wheelchair Mobility    Modified Rankin (Stroke Patients Only)       Balance Overall balance assessment: Needs assistance Sitting-balance support: Feet supported, No upper extremity supported Sitting balance-Leahy Scale: Good Sitting balance - Comments: good statically, requires unilateral  UE support for dynamic balance   Standing balance support: Reliant on assistive device for balance, During functional activity, Bilateral upper extremity supported Standing balance-Leahy Scale: Poor Standing balance comment: due to L LE pain dependent on external assist                             Pertinent Vitals/Pain Pain Assessment Pain Assessment: 0-10 Pain Score: 5  Pain Location:  L LE-calf, behind knee, up into thigh/hip Pain Descriptors / Indicators: Sore, Constant Pain Intervention(s): Limited activity within patient's tolerance    Home Living Family/patient expects to be discharged to:: Private residence Living Arrangements: Spouse/significant other Available Help at Discharge: Family;Available 24 hours/day (wife and pt retired) Type of Home: House Home Access: Level entry       Home Layout: One level Home Equipment: Cane - quad      Prior Function               Mobility Comments: indep, uses cane occasionally, reports difficulty getting down and up off commode due to low surface height ADLs Comments: pt reports independence, doesn't drive anymore and doesn't help as much with the cooking     Hand Dominance   Dominant Hand: Right    Extremity/Trunk Assessment   Upper Extremity Assessment Upper Extremity Assessment: Overall WFL for tasks assessed    Lower Extremity Assessment Lower Extremity Assessment: LLE deficits/detail LLE Deficits / Details: generalized weakness due to onset of L LE pain with manual resistance, pain also limited ability to WB on L LE    Cervical / Trunk Assessment Cervical / Trunk Assessment: Normal  Communication   Communication: No difficulties  Cognition Arousal/Alertness: Awake/alert Behavior During Therapy: WFL for tasks assessed/performed Overall Cognitive Status: Within Functional Limits for tasks assessed                                          General Comments General comments (skin integrity, edema, etc.): HR increased to 127bpm with transfer to chair, mild SOB noted however aware pt wiht increased pain. SpO2 >95% on RA    Exercises     Assessment/Plan    PT Assessment Patient needs continued PT services  PT Problem List Decreased strength;Decreased range of motion;Decreased activity tolerance;Decreased mobility;Decreased balance;Decreased coordination;Pain       PT Treatment  Interventions DME instruction;Gait training;Functional mobility training;Therapeutic activities;Therapeutic exercise;Balance training;Neuromuscular re-education    PT Goals (Current goals can be found in the Care Plan section)  Acute Rehab PT Goals Patient Stated Goal: stop the pain and go home PT Goal Formulation: With patient Time For Goal Achievement: 10/10/22 Potential to Achieve Goals: Good    Frequency Min 3X/week     Co-evaluation               AM-PAC PT "6 Clicks" Mobility  Outcome Measure Help needed turning from your back to your side while in a flat bed without using bedrails?: A Little Help needed moving from lying on your back to sitting on the side of a flat bed without using bedrails?: A Little Help needed moving to and from a bed to a chair (including a wheelchair)?: A Lot Help needed standing up from a chair using your arms (e.g., wheelchair or bedside chair)?: A Lot Help needed to walk in hospital room?: A Lot Help needed climbing 3-5 steps with  a railing? : Total 6 Click Score: 13    End of Session Equipment Utilized During Treatment: Gait belt Activity Tolerance: Patient limited by pain Patient left: in chair;with chair alarm set;with call bell/phone within reach;with nursing/sitter in room Nurse Communication: Mobility status PT Visit Diagnosis: Unsteadiness on feet (R26.81);Muscle weakness (generalized) (M62.81);Difficulty in walking, not elsewhere classified (R26.2);Pain Pain - Right/Left: Left Pain - part of body: Leg    Time: 8295-6213 PT Time Calculation (min) (ACUTE ONLY): 28 min   Charges:   PT Evaluation $PT Eval Moderate Complexity: 1 Mod PT Treatments $Therapeutic Activity: 8-22 mins        Lewis Shock, PT, DPT Acute Rehabilitation Services Secure chat preferred Office #: [phone]   Iona Hansen 09/27/2022, 10:48 AM

## 2022-09-27 NOTE — TOC CM/SW Note (Addendum)
Transition of Care [facility]) - Inpatient Brief Assessment   Patient Details  Name: KAMARII MONIGOLD MRN: [id number] Date of Birth: Oct 18, 1950  [facility]) CM/SW Contact:    Gordy Clement, RN Phone Number: 09/27/2022, 11:20 AM   Clinical Narrative:  CM met with patient. He will DC back to home with Wife  HH recommended and patient choice is for [facility]. Patient also recommended RW and BSC that has been ordered through [employer].   Patient states he will need a cab ride home (Both he and Wife are blind )  TOC will continue to follow patient for any additional discharge needs       Transition of Care Asessment: Insurance and Status: Insurance coverage has been reviewed Patient has primary care physician: Yes Home environment has been reviewed: Lives with Wife / Safe for DC back to home Prior level of function:: fairly independent Prior/Current Home Services: No current home services Social Determinants of Health Reivew: SDOH reviewed no interventions necessary Readmission risk has been reviewed: Yes (17%) Transition of care needs: transition of care needs identified, TOC will continue to follow (DME and HH to be ordered)

## 2022-09-27 NOTE — Progress Notes (Addendum)
PROGRESS NOTE        PATIENT DETAILS Name: Scott Bass Age: 72 y.o. Sex: male Date of Birth: [birth year] Admit Date: 09/25/2022 Admitting Physician Darlin Drop, DO ZOX:WRUEAV, Onalee Hua, MD  Brief Summary: Patient is a 72 y.o.  male with history of DM-2, HTN, hypothyroidism, chronic back pain who was sent from his PCPs office for evaluation of severe hypercalcemia/AKI.  Significant events: 5/20>> admit to [facility]  Significant studies: 5/20>> renal ultrasound: No hydronephrosis 5/21>> CT chest: Bony destruction/soft tissue mass extending from anterior second rib-innumerable diffuse lytic lesions throughout the visualized bony skeletons.  Significant microbiology data: None  Procedures: None  Consults: Nephrology  Subjective: Lying comfortably in bed-denies any chest pain or shortness of breath.  Objective: Vitals: Blood pressure (!) 155/69, pulse 97, temperature 98.5 F (36.9 C), temperature source Oral, resp. rate 17, height 5\' 6"  (1.676 m), weight 87.6 kg, SpO2 96 %.   Exam: Gen Exam:Alert awake-not in any distress HEENT:atraumatic, normocephalic Chest: B/L clear to auscultation anteriorly CVS:S1S2 regular Abdomen:soft non tender, non distended Extremities:no edema Neurology: Non focal Skin: no rash  Pertinent Labs/Radiology:    Latest Ref Rng & Units 09/26/2022    2:17 AM 09/25/2022    4:15 PM 09/08/2022   10:40 AM  CBC  WBC 4.0 - 10.5 K/uL 5.8  5.6  6.0   Hemoglobin 13.0 - 17.0 g/dL 7.6  7.8  7.2   Hematocrit 39.0 - 52.0 % 23.9  25.1  22.9   Platelets 150 - 400 K/uL 192  208  234     Lab Results  Component Value Date   NA 135 09/27/2022   K 3.1 (L) 09/27/2022   CL 111 09/27/2022   CO2 18 (L) 09/27/2022      Assessment/Plan: Severe hypercalcemia Likely hypercalcemia of malignancy-high suspicion for multiple myeloma Calcium levels improving with IVF/calcitonin PTH/PTHrP/1, 25 vitamin D3, SPEP levels all pending Suspect will  need dose of bisphosphonate-once renal function improves further  AKI on CKD stage IIIb Likely due to hypercalcemia-possible underlying multiple myeloma Avoid nephrotoxic agents Renal function gradually improving  Hypokalemia Replete/recheck  Normocytic anemia Probably due to combination of AKI/multiple myeloma  Presumed multiple myeloma Numerous lytic lesions on CT chest Awaiting workup Oncology consultation once workup resulted.    Addendum Discussed with oncology-Dr. Gudena-CT-guided bone marrow biopsy recommended.  HTN BP stable Norvasc/hydralazine  HLD Statin  DM-2 (A1c 7.3 on 5/21) CBGs able on SSI Resume oral hypoglycemics on discharge  Hypothyroidism Continue levothyroxine TSH stable  Obesity: Estimated body mass index is 31.17 kg/m as calculated from the following:   Height as of this encounter: 5\' 6"  (1.676 m).   Weight as of this encounter: 87.6 kg.   Code status:   Code Status: Full Code   DVT Prophylaxis: heparin injection 5,000 Units Start: 09/25/22 2300   Family Communication: None at bedside  Disposition Plan: Status is: Inpatient Remains inpatient appropriate because: Severity of illness   Planned Discharge Destination:Home   Diet: Diet Order             Diet heart healthy/carb modified Fluid consistency: Thin  Diet effective now                     Antimicrobial agents: Anti-infectives (From admission, onward)    None        MEDICATIONS: Scheduled Meds:  aspirin EC  81 mg Oral Daily   brimonidine  1 drop Both Eyes TID   calcitonin  4 Units/kg Subcutaneous BID   Chlorhexidine Gluconate Cloth  6 each Topical Daily   heparin  5,000 Units Subcutaneous Q8H   insulin aspart  0-5 Units Subcutaneous QHS   insulin aspart  0-9 Units Subcutaneous TID WC   latanoprost  1 drop Both Eyes QHS   levothyroxine  150 mcg Oral Daily   potassium chloride  40 mEq Oral Once   rosuvastatin  10 mg Oral Daily   Continuous  Infusions:  sodium chloride 150 mL/hr at 09/26/22 1304   PRN Meds:.acetaminophen, HYDROmorphone (DILAUDID) injection, oxyCODONE, polyethylene glycol, prochlorperazine   I have personally reviewed following labs and imaging studies  LABORATORY DATA: CBC: Recent Labs  Lab 09/25/22 1615 09/26/22 0217  WBC 5.6 5.8  NEUTROABS 2.6  --   HGB 7.8* 7.6*  HCT 25.1* 23.9*  MCV 89.3 87.9  PLT 208 192    Basic Metabolic Panel: Recent Labs  Lab 09/25/22 1615 09/25/22 2204 09/26/22 0217 09/27/22 0358  NA 137 139 138 135  K 3.4* 3.9 3.5 3.1*  CL 105 108 107 111  CO2 21* 20* 19* 18*  GLUCOSE 148* 129* 154* 110*  BUN 41* 37* 34* 21  CREATININE 4.14* 3.42* 3.24* 2.24*  CALCIUM >15.0* 14.2* 13.5* 10.4*  MG 1.4*  --  1.7  --   PHOS  --  >30.0* >30.0*  --     GFR: Estimated Creatinine Clearance: 31.4 mL/min (A) (by C-G formula based on SCr of 2.24 mg/dL (H)).  Liver Function Tests: Recent Labs  Lab 09/26/22 0217  ALBUMIN 3.0*   No results for input(s): "LIPASE", "AMYLASE" in the last 168 hours. No results for input(s): "AMMONIA" in the last 168 hours.  Coagulation Profile: No results for input(s): "INR", "PROTIME" in the last 168 hours.  Cardiac Enzymes: No results for input(s): "CKTOTAL", "CKMB", "CKMBINDEX", "TROPONINI" in the last 168 hours.  BNP (last 3 results) No results for input(s): "PROBNP" in the last 8760 hours.  Lipid Profile: No results for input(s): "CHOL", "HDL", "LDLCALC", "TRIG", "CHOLHDL", "LDLDIRECT" in the last 72 hours.  Thyroid Function Tests: Recent Labs    09/25/22 1954  TSH 0.619    Anemia Panel: Recent Labs    09/26/22 0524  FERRITIN 287  TIBC 357  IRON 50    Urine analysis:    Component Value Date/Time   COLORURINE STRAW (A) 09/25/2022 2039   APPEARANCEUR CLEAR 09/25/2022 2039   LABSPEC 1.005 09/25/2022 2039   PHURINE 5.0 09/25/2022 2039   GLUCOSEU NEGATIVE 09/25/2022 2039   HGBUR MODERATE (A) 09/25/2022 2039   BILIRUBINUR  NEGATIVE 09/25/2022 2039   BILIRUBINUR small (A) 11/22/2015 0929   KETONESUR NEGATIVE 09/25/2022 2039   PROTEINUR NEGATIVE 09/25/2022 2039   UROBILINOGEN 0.2 11/22/2015 0929   UROBILINOGEN 1.0 02/17/2015 0021   NITRITE NEGATIVE 09/25/2022 2039   LEUKOCYTESUR NEGATIVE 09/25/2022 2039    Sepsis Labs: Lactic Acid, Venous No results found for: "LATICACIDVEN"  MICROBIOLOGY: No results found for this or any previous visit (from the past 240 hour(s)).  RADIOLOGY STUDIES/RESULTS: CT CHEST WO CONTRAST  Result Date: 09/26/2022 CLINICAL DATA:  Abnormal xray - lung opacity/opacities EXAM: CT CHEST WITHOUT CONTRAST TECHNIQUE: Multidetector CT imaging of the chest was performed following the standard protocol without IV contrast. RADIATION DOSE REDUCTION: This exam was performed according to the departmental dose-optimization program which includes automated exposure control, adjustment of the mA and/or kV  according to patient size and/or use of iterative reconstruction technique. COMPARISON:  Chest x-ray 09/08/2022 FINDINGS: Cardiovascular: Heart is normal size. Aorta is normal caliber. Aortic atherosclerosis. Mediastinum/Nodes: No mediastinal, hilar, or axillary adenopathy. Trachea and esophagus are unremarkable. Thyroid unremarkable. Lungs/Pleura: No confluent airspace opacities or effusions. No pulmonary nodules or masses. Upper Abdomen: No acute findings Musculoskeletal: Chest wall soft tissues are unremarkable. The density seen on prior chest x-ray reflects and anterior right 2nd rib lesion with extension into the adjacent soft tissues and protrusion into the right upper lung. This measures 4 cm in greatest diameter. There are innumerable lytic lesions throughout the entire visible osseous structures occluding thoracic spine, all ribs, scapulae bilaterally, proximal humeri, clavicles and sternum. IMPRESSION: Abnormality seen on chest x-ray reflects bone destruction and soft tissue mass extending from the  anterior right 2nd rib. Innumerable diffuse lytic lesions throughout the visualized bony skeleton. Findings are compatible with metastases or myeloma. Recommend clinical correlation. Aortic Atherosclerosis (ICD10-I70.0). Electronically Signed   By: Charlett Nose M.D.   On: 09/26/2022 09:15   US RENAL  Result Date: 09/26/2022 CLINICAL DATA:  Acute kidney injury EXAM: RENAL / URINARY TRACT ULTRASOUND COMPLETE COMPARISON:  None Available. FINDINGS: Right Kidney: Renal measurements: 10.6 x 5.1 x 5.8 cm = volume: 162 mL. Echogenicity within normal limits. No mass or hydronephrosis visualized. Left Kidney: Renal measurements: 10.4 x 6.3 x 6.7 cm = volume: 228 mL. Echogenicity within normal limits. No mass or hydronephrosis visualized. Bladder: Foley catheter balloon is seen within a decompressed bladder lumen. Other: None. IMPRESSION: 1. Normal renal sonogram. Electronically Signed   By: Helyn Numbers M.D.   On: 09/26/2022 00:07     LOS: 2 days   Jeoffrey Massed, MD  [facility]    To contact the attending provider between 7A-7P or the covering provider during after hours 7P-7A, please log into the web site [email or web address] and access using universal [place] password for that web site. If you do not have the password, please call the hospital operator.  09/27/2022, 8:48 AM

## 2022-09-28 ENCOUNTER — Encounter (HOSPITAL_COMMUNITY): Payer: Self-pay | Admitting: Internal Medicine

## 2022-09-28 DIAGNOSIS — D649 Anemia, unspecified: Secondary | ICD-10-CM | POA: Diagnosis not present

## 2022-09-28 DIAGNOSIS — N179 Acute kidney failure, unspecified: Secondary | ICD-10-CM | POA: Diagnosis not present

## 2022-09-28 DIAGNOSIS — I10 Essential (primary) hypertension: Secondary | ICD-10-CM | POA: Diagnosis not present

## 2022-09-28 LAB — PROTEIN ELECTROPHORESIS, SERUM
A/G Ratio: 0.6 — ABNORMAL LOW (ref 0.7–1.7)
Albumin ELP: 3.6 g/dL (ref 2.9–4.4)
Alpha-1-Globulin: 0.4 g/dL (ref 0.0–0.4)
Alpha-2-Globulin: 1.1 g/dL — ABNORMAL HIGH (ref 0.4–1.0)
Beta Globulin: 1.3 g/dL (ref 0.7–1.3)
Gamma Globulin: 3.1 g/dL — ABNORMAL HIGH (ref 0.4–1.8)
Globulin, Total: 5.8 g/dL — ABNORMAL HIGH (ref 2.2–3.9)
M-Spike, %: 2.8 g/dL — ABNORMAL HIGH
Total Protein ELP: 9.4 g/dL — ABNORMAL HIGH (ref 6.0–8.5)

## 2022-09-28 LAB — BASIC METABOLIC PANEL
Anion gap: 10 (ref 5–15)
BUN: 15 mg/dL (ref 8–23)
CO2: 19 mmol/L — ABNORMAL LOW (ref 22–32)
Calcium: 10 mg/dL (ref 8.9–10.3)
Chloride: 112 mmol/L — ABNORMAL HIGH (ref 98–111)
Creatinine, Ser: 1.71 mg/dL — ABNORMAL HIGH (ref 0.61–1.24)
GFR, Estimated: 42 mL/min — ABNORMAL LOW (ref >60)
Glucose, Bld: 114 mg/dL — ABNORMAL HIGH (ref 70–99)
Potassium: 3.1 mmol/L — ABNORMAL LOW (ref 3.5–5.1)
Sodium: 141 mmol/L (ref 135–145)

## 2022-09-28 LAB — GLUCOSE, CAPILLARY
Glucose-Capillary: 109 mg/dL — ABNORMAL HIGH (ref 70–99)
Glucose-Capillary: 110 mg/dL — ABNORMAL HIGH (ref 70–99)
Glucose-Capillary: 120 mg/dL — ABNORMAL HIGH (ref 70–99)
Glucose-Capillary: 119 mg/dL — ABNORMAL HIGH (ref 70–99)

## 2022-09-28 LAB — CBC
HCT: 19.4 % — ABNORMAL LOW (ref 39.0–52.0)
Hemoglobin: 6.1 g/dL — CL (ref 13.0–17.0)
MCH: 27.1 pg (ref 26.0–34.0)
MCHC: 31.4 g/dL (ref 30.0–36.0)
MCV: 86.2 fL (ref 80.0–100.0)
Platelets: 149 10*3/uL — ABNORMAL LOW (ref 150–400)
RBC: 2.25 MIL/uL — ABNORMAL LOW (ref 4.22–5.81)
RDW: 17.3 % — ABNORMAL HIGH (ref 11.5–15.5)
WBC: 5.1 10*3/uL (ref 4.0–10.5)
nRBC: 1.4 % — ABNORMAL HIGH (ref 0.0–0.2)

## 2022-09-28 LAB — TYPE AND SCREEN

## 2022-09-28 LAB — BPAM RBC
Blood Product Expiration Date: 202405302359
ISSUE DATE / TIME: 202405230751

## 2022-09-28 LAB — PREPARE RBC (CROSSMATCH)

## 2022-09-28 MED ORDER — SODIUM CHLORIDE 0.9% IV SOLUTION: Freq: Once | INTRAVENOUS | Status: DC

## 2022-09-28 MED ORDER — FUROSEMIDE 10 MG/ML IJ SOLN
40.0000 mg | Freq: Once | INTRAMUSCULAR | Status: AC
  Administered 2022-09-28: 40 mg via INTRAVENOUS
  Filled 2022-09-28: qty 4

## 2022-09-28 MED ORDER — ZOLEDRONIC ACID 4 MG/100ML IV SOLN
4.0000 mg | Freq: Once | INTRAVENOUS | Status: AC
  Administered 2022-09-28: 4 mg via INTRAVENOUS
  Filled 2022-09-28: qty 100

## 2022-09-28 MED ORDER — ACETAMINOPHEN 325 MG PO TABS
650.0000 mg | ORAL_TABLET | Freq: Once | ORAL | Status: DC
  Filled 2022-09-28: qty 2

## 2022-09-28 MED ORDER — TAMSULOSIN HCL 0.4 MG PO CAPS
0.4000 mg | ORAL_CAPSULE | Freq: Every day | ORAL | Status: DC
  Administered 2022-09-28 – 2022-10-01 (×4): 0.4 mg via ORAL
  Filled 2022-09-28 (×4): qty 1

## 2022-09-28 MED ORDER — DIPHENHYDRAMINE HCL 25 MG PO CAPS
25.0000 mg | ORAL_CAPSULE | Freq: Once | ORAL | Status: AC
  Administered 2022-09-28: 25 mg via ORAL
  Filled 2022-09-28: qty 1

## 2022-09-28 NOTE — Progress Notes (Signed)
PROGRESS NOTE        PATIENT DETAILS Name: Scott Bass Age: 72 y.o. Sex: male Date of Birth: [birth year] Admit Date: 09/25/2022 Admitting Physician Darlin Drop, DO ZOX:WRUEAV, Onalee Hua, MD  Brief Summary: Patient is a 72 y.o.  male with history of DM-2, HTN, hypothyroidism, chronic back pain who was sent from his PCPs office for evaluation of severe hypercalcemia/AKI.  Significant events: 5/20>> admit to [facility]  Significant studies: 5/20>> renal ultrasound: No hydronephrosis 5/21>> CT chest: Bony destruction/soft tissue mass extending from anterior second rib-innumerable diffuse lytic lesions throughout the visualized bony skeletons.  Significant microbiology data: None  Procedures: None  Consults: Nephrology Oncology-Dr. Pamelia Hoit  Subjective: No major issues overnight-lying comfortably in bed.  Objective: Vitals: Blood pressure (!) 159/81, pulse 98, temperature 98.2 F (36.8 C), temperature source Oral, resp. rate (!) 22, height 5\' 6"  (1.676 m), weight 90.1 kg, SpO2 96 %.   Exam: Gen Exam:Alert awake-not in any distress HEENT:atraumatic, normocephalic Chest: B/L clear to auscultation anteriorly CVS:S1S2 regular Abdomen:soft non tender, non distended Extremities:no edema Neurology: Non focal Skin: no rash  Pertinent Labs/Radiology:    Latest Ref Rng & Units 09/28/2022    4:11 AM 09/26/2022    2:17 AM 09/25/2022    4:15 PM  CBC  WBC 4.0 - 10.5 K/uL 5.1  5.8  5.6   Hemoglobin 13.0 - 17.0 g/dL 6.1  7.6  7.8   Hematocrit 39.0 - 52.0 % 19.4  23.9  25.1   Platelets 150 - 400 K/uL 149  192  208     Lab Results  Component Value Date   NA 141 09/28/2022   K 3.1 (L) 09/28/2022   CL 112 (H) 09/28/2022   CO2 19 (L) 09/28/2022      Assessment/Plan: Severe hypercalcemia Likely hypercalcemia of malignancy-high suspicion for multiple myeloma Significant improvement in calcium levels with IVF/calcitonin Zometa x 1 today  PTH/1, 25 vitamin  D3 levels all appropriately suppressed PTH related peptide level still pending SPEP pending.   IR consulted for bone marrow biopsy  AKI on CKD stage IIIb Likely due to hypercalcemia-possible underlying multiple myeloma Avoid nephrotoxic agents Thankfully renal function improving with treatment of underlying hypercalcemia.  Hypokalemia Replete/recheck.  Normocytic anemia Probably due to combination of AKI/multiple myeloma-no evidence of blood loss Significant drop in hemoglobin overnight Transfused 2 units of PRBC today.  Presumed multiple myeloma Numerous lytic lesions on CT chest Hematology consulted 5/22-subsequently-IR consulted for bone marrow biopsy  HTN BP stable Norvasc/hydralazine  HLD Statin  DM-2 (A1c 7.3 on 5/21) CBGs able on SSI Resume oral hypoglycemics on discharge  Recent Labs    09/27/22 1606 09/27/22 1946 09/28/22 0833  GLUCAP 113* 155* 109*     Hypothyroidism Continue levothyroxine TSH stable  Obesity: Estimated body mass index is 32.06 kg/m as calculated from the following:   Height as of this encounter: 5\' 6"  (1.676 m).   Weight as of this encounter: 90.1 kg.   Code status:   Code Status: Full Code   DVT Prophylaxis: heparin injection 5,000 Units Start: 09/25/22 2300   Family Communication: Spouse at bedside on 5/22  Disposition Plan: Status is: Inpatient Remains inpatient appropriate because: Severity of illness   Planned Discharge Destination:Home   Diet: Diet Order             Diet NPO time specified  Diet  effective now                     Antimicrobial agents: Anti-infectives (From admission, onward)    None        MEDICATIONS: Scheduled Meds:  sodium chloride   Intravenous Once   acetaminophen  650 mg Oral Once   aspirin EC  81 mg Oral Daily   brimonidine  1 drop Both Eyes TID   Chlorhexidine Gluconate Cloth  6 each Topical Daily   heparin  5,000 Units Subcutaneous Q8H   insulin aspart  0-5 Units  Subcutaneous QHS   insulin aspart  0-9 Units Subcutaneous TID WC   latanoprost  1 drop Both Eyes QHS   levothyroxine  150 mcg Oral Daily   rosuvastatin  10 mg Oral Daily   zoledronic acid (ZOMETA) IVPB  4 mg Intravenous Once   Continuous Infusions:  sodium chloride 150 mL/hr at 09/26/22 1304   PRN Meds:.acetaminophen, HYDROmorphone (DILAUDID) injection, oxyCODONE, polyethylene glycol, prochlorperazine   I have personally reviewed following labs and imaging studies  LABORATORY DATA: CBC: Recent Labs  Lab 09/25/22 1615 09/26/22 0217 09/28/22 0411  WBC 5.6 5.8 5.1  NEUTROABS 2.6  --   --   HGB 7.8* 7.6* 6.1*  HCT 25.1* 23.9* 19.4*  MCV 89.3 87.9 86.2  PLT 208 192 149*     Basic Metabolic Panel: Recent Labs  Lab 09/25/22 1615 09/25/22 2204 09/26/22 0217 09/27/22 0358 09/28/22 0411  NA 137 139 138 135 141  K 3.4* 3.9 3.5 3.1* 3.1*  CL 105 108 107 111 112*  CO2 21* 20* 19* 18* 19*  GLUCOSE 148* 129* 154* 110* 114*  BUN 41* 37* 34* 21 15  CREATININE 4.14* 3.42* 3.24* 2.24* 1.71*  CALCIUM >15.0* 14.2* 13.5* 10.4* 10.0  MG 1.4*  --  1.7  --   --   PHOS  --  >30.0* >30.0*  --   --      GFR: Estimated Creatinine Clearance: 41.6 mL/min (A) (by C-G formula based on SCr of 1.71 mg/dL (H)).  Liver Function Tests: Recent Labs  Lab 09/26/22 0217  ALBUMIN 3.0*    No results for input(s): "LIPASE", "AMYLASE" in the last 168 hours. No results for input(s): "AMMONIA" in the last 168 hours.  Coagulation Profile: No results for input(s): "INR", "PROTIME" in the last 168 hours.  Cardiac Enzymes: No results for input(s): "CKTOTAL", "CKMB", "CKMBINDEX", "TROPONINI" in the last 168 hours.  BNP (last 3 results) No results for input(s): "PROBNP" in the last 8760 hours.  Lipid Profile: No results for input(s): "CHOL", "HDL", "LDLCALC", "TRIG", "CHOLHDL", "LDLDIRECT" in the last 72 hours.  Thyroid Function Tests: Recent Labs    09/25/22 1954  TSH 0.619     Anemia  Panel: Recent Labs    09/26/22 0524  FERRITIN 287  TIBC 357  IRON 50     Urine analysis:    Component Value Date/Time   COLORURINE STRAW (A) 09/25/2022 2039   APPEARANCEUR CLEAR 09/25/2022 2039   LABSPEC 1.005 09/25/2022 2039   PHURINE 5.0 09/25/2022 2039   GLUCOSEU NEGATIVE 09/25/2022 2039   HGBUR MODERATE (A) 09/25/2022 2039   BILIRUBINUR NEGATIVE 09/25/2022 2039   BILIRUBINUR small (A) 11/22/2015 0929   KETONESUR NEGATIVE 09/25/2022 2039   PROTEINUR NEGATIVE 09/25/2022 2039   UROBILINOGEN 0.2 11/22/2015 0929   UROBILINOGEN 1.0 02/17/2015 0021   NITRITE NEGATIVE 09/25/2022 2039   LEUKOCYTESUR NEGATIVE 09/25/2022 2039    Sepsis Labs: Lactic Acid, Venous No  results found for: "LATICACIDVEN"  MICROBIOLOGY: No results found for this or any previous visit (from the past 240 hour(s)).  RADIOLOGY STUDIES/RESULTS: No results found.   LOS: 3 days   Jeoffrey Massed, MD  [facility]    To contact the attending provider between 7A-7P or the covering provider during after hours 7P-7A, please log into the web site [email or web address] and access using universal [place] password for that web site. If you do not have the password, please call the hospital operator.  09/28/2022, 11:19 AM

## 2022-09-28 NOTE — Progress Notes (Signed)
Physical Therapy Treatment Patient Details Name: Scott Bass MRN: [id number] DOB: [birth year] Today's Date: 09/28/2022   History of Present Illness Scott Bass is a 72 y.o. male who presents with hypercalcemia, generalized body pain including hip and chronic back pain and n/v. CHest x-ray revealed 2.5cm opacity in R upper LOB lung and mild cardiomeglaly. 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 Pt was able to complete sit<>stand with cues for bil UE use to power up and min assist to steady rise. Assist to guide RW and pt able to sequence/initiate steps to move chair>EOB. Once seated and pt took therapeutic rest pt completed seated LE strengthening and functional repeat sit<>stands for strengthening. EOS pt returned to supine and repositioned in chair position. Will continue to progress pt as able during stay.    Recommendations for follow up therapy are one component of a multi-disciplinary discharge planning process, led by the attending physician.  Recommendations may be updated based on patient status, additional functional criteria and insurance authorization.  Follow Up Recommendations       Assistance Recommended at Discharge Frequent or constant Supervision/Assistance  Patient can return home with the following A lot of help with walking and/or transfers;A lot of help with bathing/dressing/bathroom;Assist for transportation   Equipment Recommendations  Rolling walker (2 wheels);BSC/3in1    Recommendations for Other Services       Precautions / Restrictions Precautions Precautions: Fall Precaution Comments: legally blind Restrictions Weight Bearing Restrictions: No     Mobility  Bed Mobility Overal bed mobility: Needs Assistance Bed Mobility: Sit to Supine        Sit to supine: Min assist   General bed mobility comments: Cues to initiate raising LE's into bed with assist to bring Lt fully onto bed.Pt using bed rail to control lowering trunk. Pt placed in trendelenburg and able to use bil UE's to boost superiorly in bed to reposition.    Transfers Overall transfer level: Needs assistance Equipment used: 1 person hand held assist Transfers: Sit to/from Stand, Bed to chair/wheelchair/BSC Sit to Stand: Min assist   Step pivot transfers: Min assist       General transfer comment: Sit>stand from recliner with min assist and cues for technique/hand placement. Min assist to guide RW with turn, pt able to initiate steps to move chair>EOB. repeated sit<>stands for LE strengthening completed from EOB wtih bil UE use and cues to reach back for controlled lowering.    Ambulation/Gait                   Stairs             Wheelchair Mobility    Modified Rankin (Stroke Patients Only)       Balance Overall balance assessment: Needs assistance Sitting-balance support: Feet supported, No upper extremity supported Sitting balance-Leahy Scale: Good Sitting balance - Comments: good statically, requires unilateral UE support for dynamic balance   Standing balance support: Reliant on assistive device for balance, During functional activity, Bilateral upper extremity supported Standing balance-Leahy Scale: Poor Standing balance comment: due to L LE pain dependent on external assist                            Cognition Arousal/Alertness: Awake/alert Behavior During Therapy: [facility] for  tasks assessed/performed Overall Cognitive Status: Within Functional Limits for tasks assessed                                          Exercises General Exercises - Lower Extremity Long Arc Quad: AROM, Both, 10 reps Hip Flexion/Marching: AROM, Both, 10 reps Other Exercises Other Exercises: 5 reps sit<>stand with bil UE use for  power up and cues for reach back to sit.    General Comments        Pertinent Vitals/Pain Pain Assessment Pain Assessment: Faces Faces Pain Scale: Hurts a little bit Pain Location: Lt groin Pain Descriptors / Indicators: Sore, Constant Pain Intervention(s): Limited activity within patient's tolerance, Monitored during session, Repositioned    Home Living                          Prior Function            PT Goals (current goals can now be found in the care plan section) Acute Rehab PT Goals Patient Stated Goal: stop the pain and go home PT Goal Formulation: With patient Time For Goal Achievement: 10/10/22 Potential to Achieve Goals: Good Progress towards PT goals: Progressing toward goals    Frequency    Min 3X/week      PT Plan Current plan remains appropriate    Co-evaluation              AM-PAC PT "6 Clicks" Mobility   Outcome Measure  Help needed turning from your back to your side while in a flat bed without using bedrails?: A Little Help needed moving from lying on your back to sitting on the side of a flat bed without using bedrails?: A Little Help needed moving to and from a bed to a chair (including a wheelchair)?: A Lot Help needed standing up from a chair using your arms (e.g., wheelchair or bedside chair)?: A Lot Help needed to walk in hospital room?: A Lot Help needed climbing 3-5 steps with a railing? : Total 6 Click Score: 13    End of Session Equipment Utilized During Treatment: Gait belt Activity Tolerance: Patient limited by pain Patient left: in chair;with chair alarm set;with call bell/phone within reach;with nursing/sitter in room Nurse Communication: Mobility status PT Visit Diagnosis: Unsteadiness on feet (R26.81);Muscle weakness (generalized) (M62.81);Difficulty in walking, not elsewhere classified (R26.2);Pain Pain - Right/Left: Left Pain - part of body: Leg     Time: 1404-1430 PT Time Calculation (min) (ACUTE  ONLY): 26 min  Charges:  $Therapeutic Exercise: 8-22 mins $Therapeutic Activity: 8-22 mins                     [name], DPT Acute Rehabilitation Services Office [phone]  09/28/22 4:37 PM

## 2022-09-28 NOTE — Consult Note (Signed)
Chief Complaint: Patient was seen in consultation today for lytic lesions, hypercalcemia  Referring Physician(s): Dr. Pamelia Hoit  Supervising Physician: Pernell Dupre  Patient Status: [facility] - In-pt  History of Present Illness: Scott Bass is a 72 y.o. legally blind male with HTN, hypothyroidism, OSA, DM, chronic back pain who was sent from doctor's office for hypercalcemia. The patient stated he has generalized body pain including hip pain, chronic back pain. His calcium level more than 15.  CT imaging of the chest shows multiple lytic lesions.  His kappa free light chain levels were significantly elevated.  IR consulted for bone marrow biopsy.   Past Medical History:  Diagnosis Date   Arthritis    "lower back" (12/18/2017)   Better eye: moderate vision impairment; lesser eye: blind    GERD (gastroesophageal reflux disease)    Glaucoma, both eyes    High cholesterol    Hypertension    Hypothyroidism    Irregular heartbeat    Legally blind    "both eyes; can see some out of left eye"   OSA (obstructive sleep apnea)    Thyroid disease    Type II diabetes mellitus (HCC)    Vitamin B12 deficiency     Past Surgical History:  Procedure Laterality Date   LEFT HEART CATH AND CORONARY ANGIOGRAPHY N/A 12/18/2017   Procedure: LEFT HEART CATH AND CORONARY ANGIOGRAPHY;  Surgeon: Elder Negus, MD;  Location: [facility] INVASIVE [facility];  Service: Cardiovascular;  Laterality: N/A;   NOSE SURGERY     "was crooked; they straightened it out" (12/18/2017)    Allergies: Hctz [hydrochlorothiazide]  Medications: Prior to Admission medications   Medication Sig Start Date End Date Taking? Authorizing Provider  amLODipine (NORVASC) 5 MG tablet TAKE 1 TABLET(5 MG) BY MOUTH DAILY Patient taking differently: Take 5 mg by mouth daily. 12/28/20  Yes Yates Decamp, MD  aspirin 81 MG tablet Take 81 mg by mouth daily.   Yes [provider]  brimonidine (ALPHAGAN) 0.2 % ophthalmic solution  Place 1 drop into both eyes 3 (three) times daily.  01/17/15  Yes [provider]  empagliflozin (JARDIANCE) 25 MG TABS tablet Take 1 tablet (25 mg total) by mouth daily before breakfast. 12/17/20  Yes Yates Decamp, MD  ferrous sulfate 325 (65 FE) MG tablet Take 325 mg by mouth every other day. 07/18/22  Yes [provider]  glimepiride (AMARYL) 1 MG tablet Take 1 mg by mouth every morning. 07/27/22  Yes [provider]  hydrALAZINE (APRESOLINE) 50 MG tablet TAKE 1 TABLET(50 MG) BY MOUTH THREE TIMES DAILY Patient taking differently: Take 50 mg by mouth 3 (three) times daily. 12/09/21  Yes Yates Decamp, MD  levothyroxine (SYNTHROID, LEVOTHROID) 150 MCG tablet Take 150 mcg by mouth daily. 12/10/14  Yes [provider]  losartan (COZAAR) 100 MG tablet Take 1 tablet by mouth daily. 09/14/20  Yes [provider]  Magnesium Oxide 400 MG CAPS Take 1 capsule by mouth daily. 09/19/22  Yes [provider]  metFORMIN (GLUCOPHAGE) 1000 MG tablet Take 500 mg by mouth 2 (two) times daily with a meal.    Yes [provider]  Metoprolol Tartrate 75 MG TABS Take 75 mg by mouth 2 (two) times daily.    Yes [provider]  oxyCODONE (OXY IR/ROXICODONE) 5 MG immediate release tablet Take 2.5 mg by mouth 4 (four) times daily as needed for moderate pain, severe pain or breakthrough pain. 09/20/22  Yes [provider]  Rosuvastatin  Calcium 20 MG CPSP Take 20 mg by mouth daily.    Yes [provider]  spironolactone (ALDACTONE) 25 MG tablet TAKE 1 TABLET BY MOUTH DAILY IN THE MORNING Patient taking differently: Take 25 mg by mouth in the morning. 01/31/21  Yes Yates Decamp, MD  tamsulosin (FLOMAX) 0.4 MG CAPS capsule Take 0.4 mg by mouth daily. 08/22/22  Yes [provider]  latanoprost (XALATAN) 0.005 % ophthalmic solution Place 1 drop into both eyes at bedtime. Patient not taking: Reported on 09/26/2022    [provider]      Family History  Problem Relation Age of Onset   Diabetes Mellitus II Mother 65   Diabetes Mellitus II Sister    Breast cancer Sister    Heart failure Sister 44    Social History   Socioeconomic History   Marital status: Married    Spouse name: Not on file   Number of children: 0   Years of education: Not on file   Highest education level: Not on file  Occupational History   Occupation: retired  Tobacco Use   Smoking status: Never   Smokeless tobacco: Never  Vaping Use   Vaping Use: Never used  Substance and Sexual Activity   Alcohol use: Yes    Comment: occ   Drug use: Never   Sexual activity: Not Currently  Other Topics Concern   Not on file  Social History Narrative   Not on file   Social Determinants of Health   Financial Resource Strain: Not on file  Food Insecurity: Not on file  Transportation Needs: Not on file  Physical Activity: Not on file  Stress: Not on file  Social Connections: Not on file     Review of Systems: A 12 point ROS discussed and pertinent positives are indicated in the HPI above.  All other systems are negative.  Review of Systems  Constitutional:  Positive for fatigue. Negative for fever.  Respiratory:  Negative for cough and shortness of breath.   Cardiovascular:  Negative for chest pain.  Gastrointestinal:  Negative for abdominal pain, nausea and vomiting.  Musculoskeletal:  Positive for arthralgias and back pain.  Psychiatric/Behavioral:  Negative for behavioral problems and confusion.     Vital Signs: BP (!) 149/67   Pulse 75   Temp 98.2 F (36.8 C) (Oral)   Resp 18   Ht 5\' 6"  (1.676 m)   Wt 198 lb 10.2 oz (90.1 kg)   SpO2 98%   BMI 32.06 kg/m   Physical Exam Vitals and nursing note reviewed.  Constitutional:      General: He is not in acute distress.    Appearance: Normal appearance. He is not ill-appearing.  HENT:     Mouth/Throat:     Mouth: Mucous membranes are moist.     Pharynx: Oropharynx is clear.   Cardiovascular:     Rate and Rhythm: Normal rate and regular rhythm.  Pulmonary:     Effort: Pulmonary effort is normal.     Breath sounds: Normal breath sounds.  Abdominal:     General: Abdomen is flat. There is no distension.     Palpations: Abdomen is soft.  Skin:    General: Skin is warm and dry.  Neurological:     General: No focal deficit present.     Mental Status: He is alert and oriented to person, place, and time. Mental status is at baseline.  Psychiatric:        Mood and Affect: Mood  normal.        Behavior: Behavior normal.        Thought Content: Thought content normal.        Judgment: Judgment normal.      MD Evaluation Airway: WNL Heart: WNL Abdomen: WNL Chest/ Lungs: WNL ASA  Classification: 3 Mallampati/Airway Score: Two   Imaging: CT CHEST WO CONTRAST  Result Date: 09/26/2022 CLINICAL DATA:  Abnormal xray - lung opacity/opacities EXAM: CT CHEST WITHOUT CONTRAST TECHNIQUE: Multidetector CT imaging of the chest was performed following the standard protocol without IV contrast. RADIATION DOSE REDUCTION: This exam was performed according to the departmental dose-optimization program which includes automated exposure control, adjustment of the mA and/or kV according to patient size and/or use of iterative reconstruction technique. COMPARISON:  Chest x-ray 09/08/2022 FINDINGS: Cardiovascular: Heart is normal size. Aorta is normal caliber. Aortic atherosclerosis. Mediastinum/Nodes: No mediastinal, hilar, or axillary adenopathy. Trachea and esophagus are unremarkable. Thyroid unremarkable. Lungs/Pleura: No confluent airspace opacities or effusions. No pulmonary nodules or masses. Upper Abdomen: No acute findings Musculoskeletal: Chest wall soft tissues are unremarkable. The density seen on prior chest x-ray reflects and anterior right 2nd rib lesion with extension into the adjacent soft tissues and protrusion into the right upper lung. This measures 4 cm in greatest  diameter. There are innumerable lytic lesions throughout the entire visible osseous structures occluding thoracic spine, all ribs, scapulae bilaterally, proximal humeri, clavicles and sternum. IMPRESSION: Abnormality seen on chest x-ray reflects bone destruction and soft tissue mass extending from the anterior right 2nd rib. Innumerable diffuse lytic lesions throughout the visualized bony skeleton. Findings are compatible with metastases or myeloma. Recommend clinical correlation. Aortic Atherosclerosis (ICD10-I70.0). Electronically Signed   By: Charlett Nose M.D.   On: 09/26/2022 09:15   DG HIP UNILAT WITH PELVIS 2-3 VIEWS LEFT  Result Date: 09/26/2022 CLINICAL DATA:  Left hip pain EXAM: DG HIP (WITH OR WITHOUT PELVIS) 2-3V LEFT COMPARISON:  None Available. FINDINGS: Normal alignment. No acute fracture or dislocation. Sacroiliac and hip joint spaces are preserved. Soft tissues are unremarkable. IMPRESSION: 1. Negative. Electronically Signed   By: Helyn Numbers M.D.   On: 09/26/2022 00:08   US RENAL  Result Date: 09/26/2022 CLINICAL DATA:  Acute kidney injury EXAM: RENAL / URINARY TRACT ULTRASOUND COMPLETE COMPARISON:  None Available. FINDINGS: Right Kidney: Renal measurements: 10.6 x 5.1 x 5.8 cm = volume: 162 mL. Echogenicity within normal limits. No mass or hydronephrosis visualized. Left Kidney: Renal measurements: 10.4 x 6.3 x 6.7 cm = volume: 228 mL. Echogenicity within normal limits. No mass or hydronephrosis visualized. Bladder: Foley catheter balloon is seen within a decompressed bladder lumen. Other: None. IMPRESSION: 1. Normal renal sonogram. Electronically Signed   By: Helyn Numbers M.D.   On: 09/26/2022 00:07   DG Chest 2 View  Result Date: 09/08/2022 CLINICAL DATA:  Left-sided chest pain. EXAM: CHEST - 2 VIEW COMPARISON:  Chest radiograph 02/17/2015 FINDINGS: The heart is mildly enlarged. The upper mediastinal contours are normal. There is ill-defined opacity in the right upper lobe  measuring up to approximately 2.5 cm, new since the prior study. There is no other focal airspace opacity. There is no pulmonary edema. There is no pleural effusion or pneumothorax There is no acute osseous abnormality. IMPRESSION: 1. 2.5 cm opacity in the right upper lobe may be artifactual; however, recommend CT of the chest to exclude a parenchymal mass. 2. Mild cardiomegaly. Electronically Signed   By: Lesia Hausen M.D.   On: 09/08/2022 11:18  Labs:  CBC: Recent Labs    09/08/22 1040 09/25/22 1615 09/26/22 0217 09/28/22 0411  WBC 6.0 5.6 5.8 5.1  HGB 7.2* 7.8* 7.6* 6.1*  HCT 22.9* 25.1* 23.9* 19.4*  PLT 234 208 192 149*    COAGS: Recent Labs    09/08/22 1310  INR 1.1    BMP: Recent Labs    09/25/22 2204 09/26/22 0217 09/27/22 0358 09/28/22 0411  NA 139 138 135 141  K 3.9 3.5 3.1* 3.1*  CL 108 107 111 112*  CO2 20* 19* 18* 19*  GLUCOSE 129* 154* 110* 114*  BUN 37* 34* 21 15  CALCIUM 14.2* 13.5* 10.4* 10.0  CREATININE 3.42* 3.24* 2.24* 1.71*  GFRNONAA 18* 20* 31* 42*    LIVER FUNCTION TESTS: Recent Labs    09/26/22 0217  ALBUMIN 3.0*    TUMOR MARKERS: No results for input(s): "AFPTM", "CEA", "CA199", "CHROMGRNA" in the last 8760 hours.  Assessment and Plan: Hypercalcemia Patient admitted with lab abnormalities also found to have lytic bone lesions.  IR consulted for bone marrow biopsy at the request for Dr. Pamelia Hoit.  NPO p MN for procedure in IR as schedule allows.   Risks and benefits was discussed with the patient and/or patient's family including, but not limited to bleeding, infection, damage to adjacent structures or low yield requiring additional tests.  All of the questions were answered and there is agreement to proceed.  Consent signed and in chart.   Thank you for this interesting consult.  I greatly enjoyed meeting DEWEL GAVILANES and look forward to participating in their care.  A copy of this report was sent to the requesting provider  on this date.  Electronically Signed: Hoyt Koch, PA 09/28/2022, 3:12 PM   I spent a total of 40 Minutes    in face to face in clinical consultation, greater than 50% of which was counseling/coordinating care for hypercalcemia, lytic bone lesions

## 2022-09-28 NOTE — Progress Notes (Addendum)
Interventional Radiology Brief Note:  IR consulted for lytic lesion biopsy.  Available imaging reviewed by Dr. Loreta Ave.  Patient has had a CT Chest showing lytic lesions.  Recommends CT Abdomen Pelvis to screen for additional targets. Ordering service made aware.   Addendum 9:23: Per Dr. Jerral Ralph and Dr. Pamelia Hoit, proceed with bone marrow biopsy/aspiration.  Will set patient up for this as schedule allows.  Formal consult to follow.   Loyce Dys, MS RD PA-C 8:59 AM

## 2022-09-28 NOTE — Progress Notes (Signed)
Mobility Specialist Progress Note   09/28/22 1104  Mobility  Activity Transferred from bed to chair  Level of Assistance Minimal assist, patient does 75% or more  Assistive Device Front wheel walker  Distance Ambulated (ft) 2 ft  Activity Response Tolerated well  Mobility Referral Yes  $Mobility charge 1 Mobility  Mobility Specialist Start Time (ACUTE ONLY) 1104  Mobility Specialist Stop Time (ACUTE ONLY) 1130  Mobility Specialist Time Calculation (min) (ACUTE ONLY) 26 min   Pre Mobility: 93 HR, 98% SpO2 During Mobility: 104 HR, 98% SpO2 Post Mobility: 101 HR, 151/69, 97% SpO2 on RA  Received in bed asleep but easily aroused, c/o LLE pain but stating it's tolerable. Session limited by pt's Hgb levels and only mobilized to chair today. Pt requiring minA to get EOB d/t trunk weakness. Able to stand w/ minG + increased time, then pivoted to chair w/o fault. Expressed pain in LLE was still present but no worse. Left w/ call bell in reach and chair alarm on.   Scott Bass Mobility Specialist Please contact via SecureChat or  Rehab office at [phone]

## 2022-09-28 NOTE — Progress Notes (Signed)
[facility] Progress Note   72 y.o. male with HTN, hypothyroidism, OSA, DM, chronic back pain who was sent from doctor's office for abnormal lab results seen as a consultation for the evaluation and management of hypercalcemia.  The patient stated he has generalized body pain including hip pain, chronic back pain. Cr level 4.14, BUN 41, calcium level more than 15, magnesium level 1.4, hemoglobin 7.8.  The previous labs from 09/08/2022 with creatinine level of 1.46 and a calcium level was 11 at that time.  The home medication include amlodipine, Jardiance, hydralazine, Synthroid, losartan, metformin, metoprolol, statin and Aldactone.  Patient denies intake of vitamin D, Tums or any calcium supplement.  The chest x-ray showed 2.5 cm opacity in the right upper lobe lung and mild cardiomegaly.  He is legally blind; wife was at the bedside.      Assessment/ Plan:   1) Severe hypercalcemia: The calcium level is >15.  From the history and the medication list no intake of vitamin D or oral calcium supplement noted.  He already received 2 L of IV fluid in the ER, Continue maintenance IV fluid NS at 150 cc an hour -> he has good U OP. Vit D level NL.  Patient has responded nicely to medical management of  hypercalcemia with IV fluid, calcitonin. Dr. Jerral Ralph to add Zometa which is reasonable.    Looking like malignancy as cause of the hypercalcemia which usually the case when the Ca is this high. Chest x-ray showing right lung opacity. CT shows bone destruction and soft tissue mass extending from the anterior right 2nd rib. Innumerable diffuse lytic lesions throughout the visualized bony skeleton.   PTH 8 which is nl response to hypercalcemia; PTHrP and SPEP pending. K light chain very high.   Signing off at this time; please reconsult as needed.  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  Checking urine, bladder scan.  I  will insert Foley catheter for strict ins and out.  He is having difficulty urinating in the urinal.  Order kidney ultrasound to rule out obstruction.  Initially holding Jardiance, losartan, Aldactone and metformin; can stagger and add back on as renal function is improving . Continue fluid and management of hypercalcemia as above.  Continued improvement and will trend. Renal function is improving making myeloma kidney unlikely.  3) Hypokalemia: Replete; certainly a component of intracellular shift as well with the hco3 therapy.   4) Hypomagnesemia: Got magnesium, follow labs.   5) Anemia: Further workup per primary team, recommend checking stool blood test etc. Iron deficient and will replete.   6) Hypertension: Blood pressure acceptable on arrival.  Can resume Norvasc, hydralazine, metoprolol and monitor BP. Subjective:   C/o pain in the left leg; denies f/c/n/v/sob/cp   Objective:   BP (!) 157/73 (BP Location: Left Arm)   Pulse 99   Temp 98.7 F (37.1 C) (Oral)   Resp 20   Ht 5\' 6"  (1.676 m)   Wt 90.1 kg   SpO2 94%   BMI 32.06 kg/m   Intake/Output Summary (Last 24 hours) at 09/28/2022 1610 Last data filed at 09/27/2022 2100 Gross per 24 hour  Intake --  Output 900 ml  Net -900 ml    Weight change:   Physical Exam: General exam: Appears comfortable in NAD Respiratory system: Clear to auscultation Cardiovascular system: S1 & S2 heard, RRR.  No pedal edema. Gastrointestinal system: Abdomen is SNDNT +BS Extremities:  No edema. Skin: No rashes, lesions or ulcers  Imaging: CT CHEST WO CONTRAST  Result Date: 09/26/2022 CLINICAL DATA:  Abnormal xray - lung opacity/opacities EXAM: CT CHEST WITHOUT CONTRAST TECHNIQUE: Multidetector CT imaging of the chest was performed following the standard protocol without IV contrast. RADIATION DOSE REDUCTION: This exam was performed according to the departmental dose-optimization program which includes automated exposure control, adjustment  of the mA and/or kV according to patient size and/or use of iterative reconstruction technique. COMPARISON:  Chest x-ray 09/08/2022 FINDINGS: Cardiovascular: Heart is normal size. Aorta is normal caliber. Aortic atherosclerosis. Mediastinum/Nodes: No mediastinal, hilar, or axillary adenopathy. Trachea and esophagus are unremarkable. Thyroid unremarkable. Lungs/Pleura: No confluent airspace opacities or effusions. No pulmonary nodules or masses. Upper Abdomen: No acute findings Musculoskeletal: Chest wall soft tissues are unremarkable. The density seen on prior chest x-ray reflects and anterior right 2nd rib lesion with extension into the adjacent soft tissues and protrusion into the right upper lung. This measures 4 cm in greatest diameter. There are innumerable lytic lesions throughout the entire visible osseous structures occluding thoracic spine, all ribs, scapulae bilaterally, proximal humeri, clavicles and sternum. IMPRESSION: Abnormality seen on chest x-ray reflects bone destruction and soft tissue mass extending from the anterior right 2nd rib. Innumerable diffuse lytic lesions throughout the visualized bony skeleton. Findings are compatible with metastases or myeloma. Recommend clinical correlation. Aortic Atherosclerosis (ICD10-I70.0). Electronically Signed   By: Charlett Nose M.D.   On: 09/26/2022 09:15    Labs: BMET Recent Labs  Lab 09/25/22 1615 09/25/22 2204 09/26/22 0217 09/27/22 0358 09/28/22 0411  NA 137 139 138 135 141  K 3.4* 3.9 3.5 3.1* 3.1*  CL 105 108 107 111 112*  CO2 21* 20* 19* 18* 19*  GLUCOSE 148* 129* 154* 110* 114*  BUN 41* 37* 34* 21 15  CREATININE 4.14* 3.42* 3.24* 2.24* 1.71*  CALCIUM >15.0* 14.2* 13.5* 10.4* 10.0  PHOS  --  >30.0* >30.0*  --   --    CBC Recent Labs  Lab 09/25/22 1615 09/26/22 0217 09/28/22 0411  WBC 5.6 5.8 5.1  NEUTROABS 2.6  --   --   HGB 7.8* 7.6* 6.1*  HCT 25.1* 23.9* 19.4*  MCV 89.3 87.9 86.2  PLT 208 192 149*    Medications:      sodium chloride   Intravenous Once   acetaminophen  650 mg Oral Once   aspirin EC  81 mg Oral Daily   brimonidine  1 drop Both Eyes TID   Chlorhexidine Gluconate Cloth  6 each Topical Daily   diphenhydrAMINE  25 mg Oral Once   furosemide  40 mg Intravenous Once   heparin  5,000 Units Subcutaneous Q8H   insulin aspart  0-5 Units Subcutaneous QHS   insulin aspart  0-9 Units Subcutaneous TID WC   latanoprost  1 drop Both Eyes QHS   levothyroxine  150 mcg Oral Daily   rosuvastatin  10 mg Oral Daily      Paulene Floor, MD 09/28/2022, 7:08 AM

## 2022-09-29 ENCOUNTER — Inpatient Hospital Stay (HOSPITAL_COMMUNITY): Payer: Medicare PPO

## 2022-09-29 DIAGNOSIS — N179 Acute kidney failure, unspecified: Secondary | ICD-10-CM | POA: Diagnosis not present

## 2022-09-29 DIAGNOSIS — C9000 Multiple myeloma not having achieved remission: Secondary | ICD-10-CM | POA: Diagnosis not present

## 2022-09-29 LAB — TYPE AND SCREEN
ABO/RH(D): B POS
Antibody Screen: NEGATIVE
Unit division: 0

## 2022-09-29 LAB — GLUCOSE, CAPILLARY
Glucose-Capillary: 125 mg/dL — ABNORMAL HIGH (ref 70–99)
Glucose-Capillary: 171 mg/dL — ABNORMAL HIGH (ref 70–99)
Glucose-Capillary: 121 mg/dL — ABNORMAL HIGH (ref 70–99)
Glucose-Capillary: 112 mg/dL — ABNORMAL HIGH (ref 70–99)

## 2022-09-29 LAB — CBC WITH DIFFERENTIAL/PLATELET
Abs Immature Granulocytes: 0.1 10*3/uL — ABNORMAL HIGH (ref 0.00–0.07)
Basophils Absolute: 0 10*3/uL (ref 0.0–0.1)
Basophils Relative: 0 %
Eosinophils Absolute: 0.1 10*3/uL (ref 0.0–0.5)
Eosinophils Relative: 1 %
HCT: 24.6 % — ABNORMAL LOW (ref 39.0–52.0)
Hemoglobin: 8.2 g/dL — ABNORMAL LOW (ref 13.0–17.0)
Lymphocytes Relative: 30 %
Lymphs Abs: 1.7 10*3/uL (ref 0.7–4.0)
MCH: 28.3 pg (ref 26.0–34.0)
MCHC: 33.3 g/dL (ref 30.0–36.0)
MCV: 84.8 fL (ref 80.0–100.0)
Monocytes Absolute: 0.5 10*3/uL (ref 0.1–1.0)
Monocytes Relative: 9 %
Myelocytes: 2 %
Neutro Abs: 3.4 10*3/uL (ref 1.7–7.7)
Neutrophils Relative %: 58 %
Platelets: 157 10*3/uL (ref 150–400)
RBC: 2.9 MIL/uL — ABNORMAL LOW (ref 4.22–5.81)
RDW: 16.5 % — ABNORMAL HIGH (ref 11.5–15.5)
WBC: 5.8 10*3/uL (ref 4.0–10.5)
nRBC: 1.9 % — ABNORMAL HIGH (ref 0.0–0.2)
nRBC: 3 /100 WBC — ABNORMAL HIGH

## 2022-09-29 LAB — BASIC METABOLIC PANEL
Anion gap: 11 (ref 5–15)
BUN: 15 mg/dL (ref 8–23)
CO2: 19 mmol/L — ABNORMAL LOW (ref 22–32)
Calcium: 10.5 mg/dL — ABNORMAL HIGH (ref 8.9–10.3)
Chloride: 107 mmol/L (ref 98–111)
Creatinine, Ser: 1.6 mg/dL — ABNORMAL HIGH (ref 0.61–1.24)
GFR, Estimated: 46 mL/min — ABNORMAL LOW (ref >60)
Glucose, Bld: 119 mg/dL — ABNORMAL HIGH (ref 70–99)
Potassium: 2.9 mmol/L — ABNORMAL LOW (ref 3.5–5.1)
Sodium: 137 mmol/L (ref 135–145)

## 2022-09-29 LAB — BPAM RBC: Unit Type and Rh: 1700

## 2022-09-29 MED ORDER — MIDAZOLAM HCL 2 MG/2ML IJ SOLN
INTRAMUSCULAR | Status: AC | PRN
  Administered 2022-09-29: 1 mg via INTRAVENOUS

## 2022-09-29 MED ORDER — FENTANYL CITRATE (PF) 100 MCG/2ML IJ SOLN
INTRAMUSCULAR | Status: AC | PRN
  Administered 2022-09-29: 25 ug via INTRAVENOUS

## 2022-09-29 MED ORDER — MIDAZOLAM HCL 2 MG/2ML IJ SOLN
INTRAMUSCULAR | Status: AC
  Filled 2022-09-29: qty 2

## 2022-09-29 MED ORDER — FENTANYL CITRATE (PF) 100 MCG/2ML IJ SOLN
INTRAMUSCULAR | Status: AC
  Filled 2022-09-29: qty 2

## 2022-09-29 MED ORDER — POTASSIUM CHLORIDE CRYS ER 20 MEQ PO TBCR
40.0000 meq | EXTENDED_RELEASE_TABLET | ORAL | Status: AC
  Administered 2022-09-29 (×2): 40 meq via ORAL
  Filled 2022-09-29 (×2): qty 2

## 2022-09-29 MED ORDER — LIDOCAINE HCL (PF) 1 % IJ SOLN
10.0000 mL | Freq: Once | INTRAMUSCULAR | Status: AC
  Administered 2022-09-29: 10 mL

## 2022-09-29 NOTE — Progress Notes (Signed)
Occupational Therapy Treatment Patient Details Name: Scott Bass MRN: [id number] DOB: 05-14-1950 Today's Date: 09/29/2022   History of present illness 72 y.o. male who presents with hypercalcemia, generalized body pain including hip and chronic back pain and n/v. CHest x-ray revealed 2.5cm opacity in R upper LOB lung and mild cardiomeglaly. 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 Pt reports Pain initially in chest from sternum to lateral under arm pit area. Pt reports its moves and it is constant. Pt with standing reports pain to LLE. Pt returned to supine due to increased pain. RN notified and providing medications. Recommendations remain appropriate for skilled inpatient follow up therapy, <3 hours/day.    Recommendations for follow up therapy are one component of a multi-disciplinary discharge planning process, led by the attending physician.  Recommendations may be updated based on patient status, additional functional criteria and insurance authorization.    Assistance Recommended at Discharge Intermittent Supervision/Assistance  Patient can return home with the following  A lot of help with walking and/or transfers;A lot of help with bathing/dressing/bathroom;Assistance with cooking/housework;Assistance with feeding   Equipment Recommendations  Tub/shower seat;BSC/3in1    Recommendations for Other Services      Precautions / Restrictions Precautions Precautions: Fall Precaution Comments: legally blind       Mobility Bed Mobility Overal bed mobility: Needs Assistance Bed Mobility: Rolling, Supine to Sit, Sit to Supine Rolling: Min assist Sidelying to sit: Min assist, HOB elevated   Sit to supine: Max assist   General  bed mobility comments: pt with 1 step commands provided prior to movement. pt with hand over hand guide initially. pt needs (A) to slide bil LE toward EOB. pt pushign up off R elbow. pt static sitting min guard. pt needs (A) to place bil LE back on bed surface and then to straighten trunk on the bed surface    Transfers Overall transfer level: Needs assistance Equipment used: Rolling walker (2 wheels) Transfers: Sit to/from Stand Sit to Stand: Min assist, From elevated surface           General transfer comment: pt was able to power up from bed surface with keeping weight off the LLE due to discomfort. pt asked to side step toward [facility]. pt with poor UB strength on RW and needing increased help of MOD to support in RW.     Balance Overall balance assessment: Needs assistance Sitting-balance support: Bilateral upper extremity supported, Feet supported Sitting balance-Leahy Scale: Good     Standing balance support: Bilateral upper extremity supported, During functional activity, Reliant on assistive device for balance Standing balance-Leahy Scale: Poor                             ADL either performed or assessed with clinical judgement   ADL Overall ADL's : Needs assistance/impaired Eating/Feeding: Set up Eating/Feeding Details (indicate cue type and reason): eating apple sauce with spillage noted on arrival                                        Extremity/Trunk Assessment Upper Extremity Assessment Upper Extremity Assessment: Overall WFL for tasks assessed   Lower  Extremity Assessment Lower Extremity Assessment: Generalized weakness        Vision   Additional Comments: pt sees light and shadows. pt could tell OT was wearing grey top and purple pants. wife present has complete blindness   Perception     Praxis      Cognition Arousal/Alertness: Awake/alert Behavior During Therapy: WFL for tasks assessed/performed Overall Cognitive Status:  Within Functional Limits for tasks assessed                                          Exercises      Shoulder Instructions       General Comments HR 122 with activity, RA    Pertinent Vitals/ Pain       Pain Assessment Pain Assessment: 0-10 Pain Score: 7  Pain Location: L side Pain Descriptors / Indicators: Sore, Constant Pain Intervention(s): Monitored during session, Repositioned, Patient requesting pain meds-RN notified, Limited activity within patient's tolerance  Home Living                                          Prior Functioning/Environment              Frequency  Min 2X/week        Progress Toward Goals  OT Goals(current goals can now be found in the care plan section)  Progress towards OT goals: Progressing toward goals  Acute Rehab OT Goals Patient Stated Goal: to get some pain medication. OT immediately notified RN OT Goal Formulation: With patient Time For Goal Achievement: 10/11/22 Potential to Achieve Goals: Good ADL Goals Pt Will Perform Grooming: with modified independence;standing Pt Will Perform Lower Body Dressing: with modified independence;sit to/from stand Pt Will Transfer to Toilet: with modified independence;ambulating Additional ADL Goal #1: Pt will demonstrate independence with 3 fall prevention strategies.  Plan Discharge plan remains appropriate    Co-evaluation                 AM-PAC OT "6 Clicks" Daily Activity     Outcome Measure   Help from another person eating meals?: A Little Help from another person taking care of personal grooming?: A Little Help from another person toileting, which includes using toliet, bedpan, or urinal?: A Lot Help from another person bathing (including washing, rinsing, drying)?: A Little Help from another person to put on and taking off regular upper body clothing?: A Little Help from another person to put on and taking off regular lower body  clothing?: A Lot 6 Click Score: 16    End of Session Equipment Utilized During Treatment: Gait belt;Rolling walker (2 wheels)  OT Visit Diagnosis: Other abnormalities of gait and mobility (R26.89);Muscle weakness (generalized) (M62.81)   Activity Tolerance Patient tolerated treatment well;Patient limited by pain   Patient Left in bed;with call bell/phone within reach;with bed alarm set;with family/visitor present   Nurse Communication Mobility status;Precautions        Time: 4098-1191 OT Time Calculation (min): 14 min  Charges: OT General Charges $OT Visit: 1 Visit OT Treatments $Self Care/Home Management : 8-22 mins   Brynn, OTR/L  Acute Rehabilitation Services Office: [phone] .   Mateo Flow 09/29/2022, 11:35 AM

## 2022-09-29 NOTE — TOC Progression Note (Signed)
Transition of Care [facility]) - Progression Note    Patient Details  Name: Scott Bass MRN: [id number] Date of Birth: 1951-01-15  Transition of Care [facility]) CM/SW Contact  Gordy Clement, RN Phone Number: 09/29/2022, 3:33 PM  Clinical Narrative:     Patient to return to home with Wife  RW and BSC have already been delivered by [employer].  Shower chair requested as well today. [facility] arranged for Home Health.  Patient MAY need cab voucher for ride home - TBD   TOC will continue to follow patient for any additional discharge needs      Expected Discharge Plan: Home/Self Care Barriers to Discharge: Continued Medical Work up  Expected Discharge Plan and Services       Living arrangements for the past 2 months: Single Family Home                 DME Arranged: N/A DME Agency: NA       HH Arranged: NA HH Agency: NA         Social Determinants of Health (SDOH) Interventions SDOH Screenings   Tobacco Use: Low Risk  (09/28/2022)    Readmission Risk Interventions     No data to display

## 2022-09-29 NOTE — Procedures (Signed)
Interventional Radiology Procedure Note  Procedure: CT guided aspirate and core biopsy of right iliac bone Complications: None Recommendations: - Bedrest supine x 1 hrs - Hydrocodone PRN  Pain - Follow biopsy results  Signed,  Kiyani Jernigan K. Ethelmae Ringel, MD   

## 2022-09-29 NOTE — Plan of Care (Signed)

## 2022-09-29 NOTE — Progress Notes (Addendum)
PROGRESS NOTE        PATIENT DETAILS Name: Scott Bass Age: 72 y.o. Sex: male Date of Birth: June 30, 1950 Admit Date: 09/25/2022 Admitting Physician Darlin Drop, DO GEX:[id number], Onalee Hua, MD  Brief Summary: Patient is a 72 y.o.  male with history of DM-2, HTN, hypothyroidism, chronic back pain who was sent from his PCPs office for evaluation of severe hypercalcemia/AKI.  Significant events: 5/20>> admit to [facility]  Significant studies: 5/20>> renal ultrasound: No hydronephrosis 5/21>> CT chest: Bony destruction/soft tissue mass extending from anterior second rib-innumerable diffuse lytic lesions throughout the visualized bony skeletons.  Significant microbiology data: None  Procedures: 5/24>> bone marrow biopsy  Consults: Nephrology Oncology-Dr. Pamelia Hoit  Subjective: Lying comfortably in bed-no major issues overnight.  Objective: Vitals: Blood pressure (!) 152/76, pulse 100, temperature 97.7 F (36.5 C), temperature source Oral, resp. rate 19, height 5\' 6"  (1.676 m), weight 90.1 kg, SpO2 94 %.   Exam: Gen Exam:Alert awake-not in any distress HEENT:atraumatic, normocephalic Chest: B/L clear to auscultation anteriorly CVS:S1S2 regular Abdomen:soft non tender, non distended Extremities:no edema Neurology: Non focal Skin: no rash  Pertinent Labs/Radiology:    Latest Ref Rng & Units 09/29/2022    4:12 AM 09/28/2022    4:11 AM 09/26/2022    2:17 AM  CBC  WBC 4.0 - 10.5 K/uL 5.8  5.1  5.8   Hemoglobin 13.0 - 17.0 g/dL 8.2  6.1  7.6   Hematocrit 39.0 - 52.0 % 24.6  19.4  23.9   Platelets 150 - 400 K/uL 157  149  192     Lab Results  Component Value Date   NA 137 09/29/2022   K 2.9 (L) 09/29/2022   CL 107 09/29/2022   CO2 19 (L) 09/29/2022      Assessment/Plan: Severe hypercalcemia Likely hypercalcemia of malignancy-high suspicion for multiple myeloma Significant improvement in calcium levels with IVF/calcitonin-and 1 dose of Zometa on  5/23 PTH/1, 25 vitamin D3 levels all appropriately suppressed PTH related peptide level still pending SPEP confirms M spike Underwent bone marrow biopsy today Continue to follow electrolytes.   AKI on CKD stage IIIb Likely due to hypercalcemia-possible underlying multiple myeloma Avoid nephrotoxic agents Thankfully renal function improving with treatment of underlying hypercalcemia.  Hypokalemia Replete/recheck.  Normocytic anemia Probably due to combination of AKI/multiple myeloma-no evidence of blood loss S/p 1 unit of PRBC yesterday-CBC now stable.  Presumed multiple myeloma Numerous lytic lesions on CT chest See above.  HTN BP stable Norvasc/hydralazine  HLD Statin  DM-2 (A1c 7.3 on 5/21) CBGs able on SSI Resume oral hypoglycemics on discharge  Recent Labs    09/28/22 1621 09/28/22 2205 09/29/22 0956  GLUCAP 120* 119* 125*      Hypothyroidism Continue levothyroxine TSH stable  Obesity: Estimated body mass index is 32.06 kg/m as calculated from the following:   Height as of this encounter: 5\' 6"  (1.676 m).   Weight as of this encounter: 90.1 kg.   Code status:   Code Status: Full Code   DVT Prophylaxis: heparin injection 5,000 Units Start: 09/25/22 2300   Family Communication: Spouse at bedside on 5/22  Disposition Plan: Status is: Inpatient Remains inpatient appropriate because: Severity of illness   Planned Discharge Destination:Home   Diet: Diet Order             Diet heart healthy/carb modified Room service appropriate? Yes  with Assist; Fluid consistency: Thin  Diet effective now                     Antimicrobial agents: Anti-infectives (From admission, onward)    None        MEDICATIONS: Scheduled Meds:  sodium chloride   Intravenous Once   acetaminophen  650 mg Oral Once   aspirin EC  81 mg Oral Daily   brimonidine  1 drop Both Eyes TID   Chlorhexidine Gluconate Cloth  6 each Topical Daily   heparin  5,000  Units Subcutaneous Q8H   insulin aspart  0-5 Units Subcutaneous QHS   insulin aspart  0-9 Units Subcutaneous TID WC   latanoprost  1 drop Both Eyes QHS   levothyroxine  150 mcg Oral Daily   potassium chloride  40 mEq Oral Q4H   rosuvastatin  10 mg Oral Daily   tamsulosin  0.4 mg Oral Daily   Continuous Infusions:  sodium chloride Stopped (09/29/22 0800)   PRN Meds:.acetaminophen, HYDROmorphone (DILAUDID) injection, oxyCODONE, polyethylene glycol, prochlorperazine   I have personally reviewed following labs and imaging studies  LABORATORY DATA: CBC: Recent Labs  Lab 09/25/22 1615 09/26/22 0217 09/28/22 0411 09/29/22 0412  WBC 5.6 5.8 5.1 5.8  NEUTROABS 2.6  --   --  3.4  HGB 7.8* 7.6* 6.1* 8.2*  HCT 25.1* 23.9* 19.4* 24.6*  MCV 89.3 87.9 86.2 84.8  PLT 208 192 149* 157     Basic Metabolic Panel: Recent Labs  Lab 09/25/22 1615 09/25/22 2204 09/26/22 0217 09/27/22 0358 09/28/22 0411 09/29/22 0412  NA 137 139 138 135 141 137  K 3.4* 3.9 3.5 3.1* 3.1* 2.9*  CL 105 108 107 111 112* 107  CO2 21* 20* 19* 18* 19* 19*  GLUCOSE 148* 129* 154* 110* 114* 119*  BUN 41* 37* 34* 21 15 15   CREATININE 4.14* 3.42* 3.24* 2.24* 1.71* 1.60*  CALCIUM >15.0* 14.2* 13.5* 10.4* 10.0 10.5*  MG 1.4*  --  1.7  --   --   --   PHOS  --  >30.0* >30.0*  --   --   --      GFR: Estimated Creatinine Clearance: 44.5 mL/min (A) (by C-G formula based on SCr of 1.6 mg/dL (H)).  Liver Function Tests: Recent Labs  Lab 09/26/22 0217  ALBUMIN 3.0*    No results for input(s): "LIPASE", "AMYLASE" in the last 168 hours. No results for input(s): "AMMONIA" in the last 168 hours.  Coagulation Profile: No results for input(s): "INR", "PROTIME" in the last 168 hours.  Cardiac Enzymes: No results for input(s): "CKTOTAL", "CKMB", "CKMBINDEX", "TROPONINI" in the last 168 hours.  BNP (last 3 results) No results for input(s): "PROBNP" in the last 8760 hours.  Lipid Profile: No results for  input(s): "CHOL", "HDL", "LDLCALC", "TRIG", "CHOLHDL", "LDLDIRECT" in the last 72 hours.  Thyroid Function Tests: No results for input(s): "TSH", "T4TOTAL", "FREET4", "T3FREE", "THYROIDAB" in the last 72 hours.   Anemia Panel: No results for input(s): "VITAMINB12", "FOLATE", "FERRITIN", "TIBC", "IRON", "RETICCTPCT" in the last 72 hours.   Urine analysis:    Component Value Date/Time   COLORURINE STRAW (A) 09/25/2022 2039   APPEARANCEUR CLEAR 09/25/2022 2039   LABSPEC 1.005 09/25/2022 2039   PHURINE 5.0 09/25/2022 2039   GLUCOSEU NEGATIVE 09/25/2022 2039   HGBUR MODERATE (A) 09/25/2022 2039   BILIRUBINUR NEGATIVE 09/25/2022 2039   BILIRUBINUR small (A) 11/22/2015 0929   KETONESUR NEGATIVE 09/25/2022 2039   PROTEINUR NEGATIVE 09/25/2022  2039   UROBILINOGEN 0.2 11/22/2015 0929   UROBILINOGEN 1.0 02/17/2015 0021   NITRITE NEGATIVE 09/25/2022 2039   LEUKOCYTESUR NEGATIVE 09/25/2022 2039    Sepsis Labs: Lactic Acid, Venous No results found for: "LATICACIDVEN"  MICROBIOLOGY: No results found for this or any previous visit (from the past 240 hour(s)).  RADIOLOGY STUDIES/RESULTS: CT BONE MARROW BIOPSY & ASPIRATION  Result Date: 09/29/2022 INDICATION: 72 year old male with multifocal lytic lesions and hypercalcemia as well as elevated kappa light change. He presents for bone marrow biopsy. EXAM: CT GUIDED BONE MARROW ASPIRATION AND CORE BIOPSY Interventional Radiologist:  Sterling Big, MD MEDICATIONS: None. ANESTHESIA/SEDATION: Moderate (conscious) sedation was employed during this procedure. A total of 1 milligrams versed and 25 micrograms fentanyl were administered intravenously. The patient's level of consciousness and vital signs were monitored continuously by radiology nursing throughout the procedure under my direct supervision. Total monitored sedation time: 14 minutes FLUOROSCOPY: None. COMPLICATIONS: None immediate. Estimated blood loss: <25 mL PROCEDURE: Informed written  consent was obtained from the patient after a thorough discussion of the procedural risks, benefits and alternatives. All questions were addressed. Maximal Sterile Barrier Technique was utilized including caps, mask, sterile gowns, sterile gloves, sterile drape, hand hygiene and skin antiseptic. A timeout was performed prior to the initiation of the procedure. The patient was positioned prone and non-contrast localization CT was performed of the pelvis to demonstrate the iliac marrow spaces. Maximal barrier sterile technique utilized including caps, mask, sterile gowns, sterile gloves, large sterile drape, hand hygiene, and betadine prep. Under sterile conditions and local anesthesia, an 11 gauge coaxial bone biopsy needle was advanced into the right iliac marrow space. Needle position was confirmed with CT imaging. Initially, bone marrow aspiration was performed. Next, the 11 gauge outer cannula was utilized to obtain a right iliac bone marrow core biopsy. Needle was removed. Hemostasis was obtained with compression. The patient tolerated the procedure well. Samples were prepared with the cytotechnologist. IMPRESSION: CT-guided bone marrow aspiration and core biopsy. Electronically Signed   By: Malachy Moan M.D.   On: 09/29/2022 10:49     LOS: 4 days   Jeoffrey Massed, MD  [facility]    To contact the attending provider between 7A-7P or the covering provider during after hours 7P-7A, please log into the web site [email or web address] and access using universal [place] password for that web site. If you do not have the password, please call the hospital operator.  09/29/2022, 11:44 AM

## 2022-09-29 NOTE — Care Management Important Message (Signed)
Important Message  Patient Details  Name: RIELEY JAECKS MRN: [id number] Date of Birth: [birth year]   Medicare Important Message Given:  Yes     Eller Sweis Stefan Church 09/29/2022, 3:41 PM

## 2022-09-30 ENCOUNTER — Inpatient Hospital Stay (HOSPITAL_COMMUNITY): Payer: Medicare PPO

## 2022-09-30 DIAGNOSIS — C9000 Multiple myeloma not having achieved remission: Secondary | ICD-10-CM | POA: Diagnosis not present

## 2022-09-30 DIAGNOSIS — R079 Chest pain, unspecified: Secondary | ICD-10-CM

## 2022-09-30 DIAGNOSIS — N179 Acute kidney failure, unspecified: Secondary | ICD-10-CM | POA: Diagnosis not present

## 2022-09-30 LAB — ECHOCARDIOGRAM COMPLETE
Height: 66 in
S' Lateral: 3.2 cm
Weight: 3178.15 oz

## 2022-09-30 LAB — GLUCOSE, CAPILLARY
Glucose-Capillary: 121 mg/dL — ABNORMAL HIGH (ref 70–99)
Glucose-Capillary: 173 mg/dL — ABNORMAL HIGH (ref 70–99)
Glucose-Capillary: 145 mg/dL — ABNORMAL HIGH (ref 70–99)
Glucose-Capillary: 144 mg/dL — ABNORMAL HIGH (ref 70–99)

## 2022-09-30 LAB — BASIC METABOLIC PANEL
Anion gap: 11 (ref 5–15)
BUN: 16 mg/dL (ref 8–23)
CO2: 19 mmol/L — ABNORMAL LOW (ref 22–32)
Calcium: 9.8 mg/dL (ref 8.9–10.3)
Chloride: 102 mmol/L (ref 98–111)
Creatinine, Ser: 1.55 mg/dL — ABNORMAL HIGH (ref 0.61–1.24)
GFR, Estimated: 48 mL/min — ABNORMAL LOW (ref >60)
Glucose, Bld: 116 mg/dL — ABNORMAL HIGH (ref 70–99)
Potassium: 2.9 mmol/L — ABNORMAL LOW (ref 3.5–5.1)
Sodium: 132 mmol/L — ABNORMAL LOW (ref 135–145)

## 2022-09-30 LAB — PTH-RELATED PEPTIDE: PTH-related peptide: <2 pmol/L

## 2022-09-30 LAB — TROPONIN I (HIGH SENSITIVITY)
Troponin I (High Sensitivity): 76 ng/L — ABNORMAL HIGH (ref <18)
Troponin I (High Sensitivity): 80 ng/L — ABNORMAL HIGH (ref <18)

## 2022-09-30 LAB — MAGNESIUM: Magnesium: 1.1 mg/dL — ABNORMAL LOW (ref 1.7–2.4)

## 2022-09-30 MED ORDER — POTASSIUM CHLORIDE CRYS ER 20 MEQ PO TBCR
40.0000 meq | EXTENDED_RELEASE_TABLET | Freq: Four times a day (QID) | ORAL | Status: AC
  Administered 2022-09-30 (×3): 40 meq via ORAL
  Filled 2022-09-30 (×3): qty 2

## 2022-09-30 MED ORDER — MAGNESIUM SULFATE 4 GM/100ML IV SOLN
4.0000 g | Freq: Once | INTRAVENOUS | Status: AC
  Administered 2022-09-30: 4 g via INTRAVENOUS
  Filled 2022-09-30: qty 100

## 2022-09-30 MED ORDER — PERFLUTREN LIPID MICROSPHERE
1.0000 mL | INTRAVENOUS | Status: AC | PRN
  Administered 2022-09-30: 2 mL via INTRAVENOUS

## 2022-09-30 MED ORDER — LIDOCAINE 5 % EX PTCH
1.0000 | MEDICATED_PATCH | CUTANEOUS | Status: DC
  Administered 2022-09-30 – 2022-10-01 (×2): 1 via TRANSDERMAL
  Filled 2022-09-30 (×2): qty 1

## 2022-09-30 NOTE — Progress Notes (Signed)
  Echocardiogram 2D Echocardiogram has been performed.  Scott Bass 09/30/2022, 12:38 PM

## 2022-09-30 NOTE — Progress Notes (Signed)
PROGRESS NOTE        PATIENT DETAILS Name: Scott Bass Age: 72 y.o. Sex: male Date of Birth: 1950-07-17 Admit Date: 09/25/2022 Admitting Physician Darlin Drop, DO WUJ:[id number], Onalee Hua, MD  Brief Summary: Patient is a 71 y.o.  male with history of DM-2, HTN, hypothyroidism, chronic back pain who was sent from his PCPs office for evaluation of severe hypercalcemia/AKI/anemia-further evaluation now consistent with presumed multiple myeloma-see below for further details..  Significant events: 5/20>> admit to [facility]  Significant studies: 5/20>> renal ultrasound: No hydronephrosis 5/21>> CT chest: Bony destruction/soft tissue mass extending from anterior second rib-innumerable diffuse lytic lesions throughout the visualized bony skeletons.  Significant microbiology data: None  Procedures: 5/24>> bone marrow biopsy  Consults: Nephrology Oncology-Dr. Pamelia Hoit  Subjective: Some mild left-sided-but easily reproducible chest pain today.  Objective: Vitals: Blood pressure (!) 153/79, pulse (!) 113, temperature 98.2 F (36.8 C), temperature source Oral, resp. rate (!) 24, height 5\' 6"  (1.676 m), weight 90.1 kg, SpO2 94 %.   Exam: Gen Exam:Alert awake-not in any distress HEENT:atraumatic, normocephalic Chest: B/L clear to auscultation anteriorly CVS:S1S2 regular Abdomen:soft non tender, non distended Extremities:no edema Neurology: Non focal Skin: no rash  Pertinent Labs/Radiology:    Latest Ref Rng & Units 09/29/2022    4:12 AM 09/28/2022    4:11 AM 09/26/2022    2:17 AM  CBC  WBC 4.0 - 10.5 K/uL 5.8  5.1  5.8   Hemoglobin 13.0 - 17.0 g/dL 8.2  6.1  7.6   Hematocrit 39.0 - 52.0 % 24.6  19.4  23.9   Platelets 150 - 400 K/uL 157  149  192     Lab Results  Component Value Date   NA 132 (L) 09/30/2022   K 2.9 (L) 09/30/2022   CL 102 09/30/2022   CO2 19 (L) 09/30/2022      Assessment/Plan: Severe hypercalcemia Secondary to multiple myeloma   Improved with IVF/IV calcitonin-and 1 dose of Zometa l  Multiple myeloma SPEP positive for M spike Bone marrow biopsy pending Discussed with hematology-Dr. Oletha Cruel follow-up being arranged for next week.  AKI on CKD stage IIIb Likely due to hypercalcemia-possible underlying multiple myeloma Avoid nephrotoxic agents Thankfully renal function improving with treatment of underlying hypercalcemia.  Hypokalemia Hypomagnesemia Replete/recheck.  Atypical chest pain Likely musculoskeletal EKG nonacute Troponins x 2 Echo Tylenol/Lidoderm transdermally Follow  Normocytic anemia Probably due to combination of AKI/multiple myeloma-no evidence of blood loss S/p 1 unit of PRBC 5/23-hemoglobin now stable.  HTN BP stable Norvasc/hydralazine  HLD Statin  DM-2 (A1c 7.3 on 5/21) CBGs able on SSI Resume oral hypoglycemics on discharge  Recent Labs    09/29/22 1548 09/29/22 2133 09/30/22 0756  GLUCAP 121* 112* 121*      Hypothyroidism Continue levothyroxine TSH stable  Obesity: Estimated body mass index is 32.06 kg/m as calculated from the following:   Height as of this encounter: 5\' 6"  (1.676 m).   Weight as of this encounter: 90.1 kg.   Code status:   Code Status: Full Code   DVT Prophylaxis: heparin injection 5,000 Units Start: 09/25/22 2300   Family Communication: Spouse at bedside on 5/22  Disposition Plan: Status is: Inpatient Remains inpatient appropriate because: Severity of illness   Planned Discharge Destination:Home   Diet: Diet Order             Diet heart  healthy/carb modified Room service appropriate? Yes with Assist; Fluid consistency: Thin  Diet effective now                     Antimicrobial agents: Anti-infectives (From admission, onward)    None        MEDICATIONS: Scheduled Meds:  sodium chloride   Intravenous Once   acetaminophen  650 mg Oral Once   aspirin EC  81 mg Oral Daily   brimonidine  1 drop Both  Eyes TID   Chlorhexidine Gluconate Cloth  6 each Topical Daily   heparin  5,000 Units Subcutaneous Q8H   insulin aspart  0-5 Units Subcutaneous QHS   insulin aspart  0-9 Units Subcutaneous TID WC   latanoprost  1 drop Both Eyes QHS   levothyroxine  150 mcg Oral Daily   lidocaine  1 patch Transdermal Q24H   potassium chloride  40 mEq Oral Q6H   rosuvastatin  10 mg Oral Daily   tamsulosin  0.4 mg Oral Daily   Continuous Infusions:  magnesium sulfate bolus IVPB     PRN Meds:.acetaminophen, HYDROmorphone (DILAUDID) injection, polyethylene glycol, prochlorperazine   I have personally reviewed following labs and imaging studies  LABORATORY DATA: CBC: Recent Labs  Lab 09/25/22 1615 09/26/22 0217 09/28/22 0411 09/29/22 0412  WBC 5.6 5.8 5.1 5.8  NEUTROABS 2.6  --   --  3.4  HGB 7.8* 7.6* 6.1* 8.2*  HCT 25.1* 23.9* 19.4* 24.6*  MCV 89.3 87.9 86.2 84.8  PLT 208 192 149* 157     Basic Metabolic Panel: Recent Labs  Lab 09/25/22 1615 09/25/22 2204 09/26/22 0217 09/27/22 0358 09/28/22 0411 09/29/22 0412 09/30/22 0230 09/30/22 0733  NA 137 139 138 135 141 137 132*  --   K 3.4* 3.9 3.5 3.1* 3.1* 2.9* 2.9*  --   CL 105 108 107 111 112* 107 102  --   CO2 21* 20* 19* 18* 19* 19* 19*  --   GLUCOSE 148* 129* 154* 110* 114* 119* 116*  --   BUN 41* 37* 34* 21 15 15 16   --   CREATININE 4.14* 3.42* 3.24* 2.24* 1.71* 1.60* 1.55*  --   CALCIUM >15.0* 14.2* 13.5* 10.4* 10.0 10.5* 9.8  --   MG 1.4*  --  1.7  --   --   --   --  1.1*  PHOS  --  >30.0* >30.0*  --   --   --   --   --      GFR: Estimated Creatinine Clearance: 45.9 mL/min (A) (by C-G formula based on SCr of 1.55 mg/dL (H)).  Liver Function Tests: Recent Labs  Lab 09/26/22 0217  ALBUMIN 3.0*    No results for input(s): "LIPASE", "AMYLASE" in the last 168 hours. No results for input(s): "AMMONIA" in the last 168 hours.  Coagulation Profile: No results for input(s): "INR", "PROTIME" in the last 168  hours.  Cardiac Enzymes: No results for input(s): "CKTOTAL", "CKMB", "CKMBINDEX", "TROPONINI" in the last 168 hours.  BNP (last 3 results) No results for input(s): "PROBNP" in the last 8760 hours.  Lipid Profile: No results for input(s): "CHOL", "HDL", "LDLCALC", "TRIG", "CHOLHDL", "LDLDIRECT" in the last 72 hours.  Thyroid Function Tests: No results for input(s): "TSH", "T4TOTAL", "FREET4", "T3FREE", "THYROIDAB" in the last 72 hours.   Anemia Panel: No results for input(s): "VITAMINB12", "FOLATE", "FERRITIN", "TIBC", "IRON", "RETICCTPCT" in the last 72 hours.   Urine analysis:    Component Value Date/Time  COLORURINE STRAW (A) 09/25/2022 2039   APPEARANCEUR CLEAR 09/25/2022 2039   LABSPEC 1.005 09/25/2022 2039   PHURINE 5.0 09/25/2022 2039   GLUCOSEU NEGATIVE 09/25/2022 2039   HGBUR MODERATE (A) 09/25/2022 2039   BILIRUBINUR NEGATIVE 09/25/2022 2039   BILIRUBINUR small (A) 11/22/2015 0929   KETONESUR NEGATIVE 09/25/2022 2039   PROTEINUR NEGATIVE 09/25/2022 2039   UROBILINOGEN 0.2 11/22/2015 0929   UROBILINOGEN 1.0 02/17/2015 0021   NITRITE NEGATIVE 09/25/2022 2039   LEUKOCYTESUR NEGATIVE 09/25/2022 2039    Sepsis Labs: Lactic Acid, Venous No results found for: "LATICACIDVEN"  MICROBIOLOGY: No results found for this or any previous visit (from the past 240 hour(s)).  RADIOLOGY STUDIES/RESULTS: CT BONE MARROW BIOPSY & ASPIRATION  Result Date: 09/29/2022 INDICATION: 72 year old male with multifocal lytic lesions and hypercalcemia as well as elevated kappa light change. He presents for bone marrow biopsy. EXAM: CT GUIDED BONE MARROW ASPIRATION AND CORE BIOPSY Interventional Radiologist:  Sterling Big, MD MEDICATIONS: None. ANESTHESIA/SEDATION: Moderate (conscious) sedation was employed during this procedure. A total of 1 milligrams versed and 25 micrograms fentanyl were administered intravenously. The patient's level of consciousness and vital signs were monitored  continuously by radiology nursing throughout the procedure under my direct supervision. Total monitored sedation time: 14 minutes FLUOROSCOPY: None. COMPLICATIONS: None immediate. Estimated blood loss: <25 mL PROCEDURE: Informed written consent was obtained from the patient after a thorough discussion of the procedural risks, benefits and alternatives. All questions were addressed. Maximal Sterile Barrier Technique was utilized including caps, mask, sterile gowns, sterile gloves, sterile drape, hand hygiene and skin antiseptic. A timeout was performed prior to the initiation of the procedure. The patient was positioned prone and non-contrast localization CT was performed of the pelvis to demonstrate the iliac marrow spaces. Maximal barrier sterile technique utilized including caps, mask, sterile gowns, sterile gloves, large sterile drape, hand hygiene, and betadine prep. Under sterile conditions and local anesthesia, an 11 gauge coaxial bone biopsy needle was advanced into the right iliac marrow space. Needle position was confirmed with CT imaging. Initially, bone marrow aspiration was performed. Next, the 11 gauge outer cannula was utilized to obtain a right iliac bone marrow core biopsy. Needle was removed. Hemostasis was obtained with compression. The patient tolerated the procedure well. Samples were prepared with the cytotechnologist. IMPRESSION: CT-guided bone marrow aspiration and core biopsy. Electronically Signed   By: Malachy Moan M.D.   On: 09/29/2022 10:49     LOS: 5 days   Jeoffrey Massed, MD  [facility]    To contact the attending provider between 7A-7P or the covering provider during after hours 7P-7A, please log into the web site [email or web address] and access using universal [place] password for that web site. If you do not have the password, please call the hospital operator.  09/30/2022, 11:09 AM

## 2022-09-30 NOTE — Plan of Care (Signed)

## 2022-10-01 DIAGNOSIS — E118 Type 2 diabetes mellitus with unspecified complications: Secondary | ICD-10-CM | POA: Diagnosis not present

## 2022-10-01 DIAGNOSIS — N179 Acute kidney failure, unspecified: Secondary | ICD-10-CM | POA: Diagnosis not present

## 2022-10-01 LAB — CBC
HCT: 24.4 % — ABNORMAL LOW (ref 39.0–52.0)
Hemoglobin: 8.1 g/dL — ABNORMAL LOW (ref 13.0–17.0)
MCH: 28.2 pg (ref 26.0–34.0)
MCHC: 33.2 g/dL (ref 30.0–36.0)
MCV: 85 fL (ref 80.0–100.0)
Platelets: 123 10*3/uL — ABNORMAL LOW (ref 150–400)
RBC: 2.87 MIL/uL — ABNORMAL LOW (ref 4.22–5.81)
RDW: 16.5 % — ABNORMAL HIGH (ref 11.5–15.5)
WBC: 6.6 10*3/uL (ref 4.0–10.5)
nRBC: 1.1 % — ABNORMAL HIGH (ref 0.0–0.2)

## 2022-10-01 LAB — RENAL FUNCTION PANEL
Albumin: 2.2 g/dL — ABNORMAL LOW (ref 3.5–5.0)
Anion gap: 8 (ref 5–15)
BUN: 16 mg/dL (ref 8–23)
CO2: 18 mmol/L — ABNORMAL LOW (ref 22–32)
Calcium: 8.3 mg/dL — ABNORMAL LOW (ref 8.9–10.3)
Chloride: 103 mmol/L (ref 98–111)
Creatinine, Ser: 1.47 mg/dL — ABNORMAL HIGH (ref 0.61–1.24)
GFR, Estimated: 51 mL/min — ABNORMAL LOW (ref >60)
Glucose, Bld: 118 mg/dL — ABNORMAL HIGH (ref 70–99)
Phosphorus: >30 mg/dL — ABNORMAL HIGH (ref 2.5–4.6)
Potassium: 3.7 mmol/L (ref 3.5–5.1)
Sodium: 129 mmol/L — ABNORMAL LOW (ref 135–145)

## 2022-10-01 LAB — MAGNESIUM: Magnesium: 2.1 mg/dL (ref 1.7–2.4)

## 2022-10-01 LAB — GLUCOSE, CAPILLARY: Glucose-Capillary: 112 mg/dL — ABNORMAL HIGH (ref 70–99)

## 2022-10-01 MED ORDER — SEVELAMER CARBONATE 800 MG PO TABS: 800.0000 mg | ORAL_TABLET | Freq: Three times a day (TID) | ORAL | Status: DC

## 2022-10-01 MED ORDER — SEVELAMER CARBONATE 800 MG PO TABS: 800.0000 mg | ORAL_TABLET | Freq: Three times a day (TID) | ORAL | 0 refills | Status: AC

## 2022-10-01 NOTE — Discharge Summary (Signed)
PATIENT DETAILS Name: Scott Bass Age: 72 y.o. Sex: male Date of Birth: [birth year] MRN: [id number]. Admitting Physician: Darlin Drop, DO WUJ:[id number], Onalee Hua, MD  Admit Date: 09/25/2022 Discharge date: 10/01/2022  Recommendations for Outpatient Follow-up:  Follow up with PCP in 1-2 weeks Please obtain CMP/CBC in one week Please ensure follow-up with oncology-see below.  Admitted From:  Home  Disposition: Home health   Discharge Condition: good  CODE STATUS:   Code Status: Full Code   Diet recommendation:  Diet Order             Diet - low sodium heart healthy           Diet Carb Modified           Diet heart healthy/carb modified Room service appropriate? Yes with Assist; Fluid consistency: Thin  Diet effective now                    Brief Summary: Patient is a 72 y.o.  male with history of DM-2, HTN, hypothyroidism, chronic back pain who was sent from his PCPs office for evaluation of severe hypercalcemia/AKI/anemia-further evaluation now consistent with presumed multiple myeloma-see below for further details..   Significant events: 5/20>> admit to [facility]   Significant studies: 5/20>> renal ultrasound: No hydronephrosis 5/20>> SPEP: M spike present. 5/20>> Kappa/lambda ratio: 20.43 5/21>> CT chest: Bony destruction/soft tissue mass extending from anterior second rib-innumerable diffuse lytic lesions throughout the visualized bony skeletons.   Significant microbiology data: None   Procedures: 5/24>> bone marrow biopsy   Consults: Nephrology Oncology-Dr. Hamilton Hospital Course: Severe hypercalcemia Secondary to multiple myeloma  Improved with IVF/IV calcitonin-and 1 dose of Zometa  Calcium levels have essentially normalized.   Multiple myeloma SPEP positive for M spike Bone marrow biopsy done-results pending.  Case was discussed very closely with Dr. Pamelia Hoit over the phone-he reviewed the chart-no specific recommendations-outpatient  follow-up with oncology arranged for 5/30.  Hopefully by then his biopsy results would have been obtained.   AKI on CKD stage IIIb Likely due to hypercalcemia-possible underlying multiple myeloma Avoid nephrotoxic agents Thankfully renal function improving with treatment of underlying hypercalcemia.   Hypokalemia Hypomagnesemia Repleted/normalized  Hyperphosphatemia Likely due to bone turnover/osteoclastic activity Discussed with nephrology-Dr. Doristine Mango start binders (sevelamer considered but apparently not covered by insurance-hence switch to PhosLo-please continue to monitor calcium levels closely. Please recheck phosphorus and calcium levels at next appointment with oncology and adjust binder doses accordingly.   Atypical chest pain Likely musculoskeletal EKG nonacute Troponins x 2 minimally elevated Echo with stable EF Treated with Tylenol/Lidoderm patch-chest pain has essentially resolved.   Normocytic anemia Probably due to combination of AKI/multiple myeloma-no evidence of blood loss S/p 1 unit of PRBC 5/23-hemoglobin now stable.   HTN BP stable but being up Resume metoprolol on discharge Continue Norvasc/hydralazine Holding losartan for now.   HLD Statin   DM-2 (A1c 7.3 on 5/21) CBGs able on SSI Resume oral hypoglycemics on discharge  Debility/deconditioning PT/OT eval-Home health recommended.  Obesity: Estimated body mass index is 32.06 kg/m as calculated from the following:   Height as of this encounter: 5\' 6"  (1.676 m).   Weight as of this encounter: 90.1 kg.      Discharge Diagnoses:  Principal Problem:   Hypercalcemia   Discharge Instructions:  Activity:  As tolerated    Discharge Instructions     Call MD for:  difficulty breathing, headache or visual disturbances   Complete by: As directed  Call MD for:  persistant nausea and vomiting   Complete by: As directed    Call MD for:  redness, tenderness, or signs of infection (pain,  swelling, redness, odor or green/yellow discharge around incision site)   Complete by: As directed    Diet - low sodium heart healthy   Complete by: As directed    Diet Carb Modified   Complete by: As directed    Discharge instructions   Complete by: As directed    Follow with Primary MD  Tracey Harries, MD in 1-2 weeks  Follow-up with the cancer center 10/05/2022 at 9 AM.  Please get a complete blood count and chemistry panel checked by your Primary MD at your next visit, and again as instructed by your Primary MD.  Get Medicines reviewed and adjusted: Please take all your medications with you for your next visit with your Primary MD  Laboratory/radiological data: Please request your Primary MD to go over all hospital tests and procedure/radiological results at the follow up, please ask your Primary MD to get all Hospital records sent to his/her office.  In some cases, they will be blood work, cultures and biopsy results pending at the time of your discharge. Please request that your primary care M.D. follows up on these results.  Also Note the following: If you experience worsening of your admission symptoms, develop shortness of breath, life threatening emergency, suicidal or homicidal thoughts you must seek medical attention immediately by calling 911 or calling your MD immediately  if symptoms less severe.  You must read complete instructions/literature along with all the possible adverse reactions/side effects for all the Medicines you take and that have been prescribed to you. Take any new Medicines after you have completely understood and accpet all the possible adverse reactions/side effects.   Do not drive when taking Pain medications or sleeping medications (Benzodaizepines)  Do not take more than prescribed Pain, Sleep and Anxiety Medications. It is not advisable to combine anxiety,sleep and pain medications without talking with your primary care practitioner  Special  Instructions: If you have smoked or chewed Tobacco  in the last 2 yrs please stop smoking, stop any regular Alcohol  and or any Recreational drug use.  Wear Seat belts while driving.  Please note: You were cared for by a hospitalist during your hospital stay. Once you are discharged, your primary care physician will handle any further medical issues. Please note that NO REFILLS for any discharge medications will be authorized once you are discharged, as it is imperative that you return to your primary care physician (or establish a relationship with a primary care physician if you do not have one) for your post hospital discharge needs so that they can reassess your need for medications and monitor your lab values.   Increase activity slowly   Complete by: As directed    No dressing needed   Complete by: As directed       Allergies as of 10/01/2022       Reactions   Hctz [hydrochlorothiazide] Other (See Comments)   Pancreatitis         Medication List     STOP taking these medications    losartan 100 MG tablet Commonly known as: COZAAR   spironolactone 25 MG tablet Commonly known as: ALDACTONE       TAKE these medications    amLODipine 5 MG tablet Commonly known as: NORVASC TAKE 1 TABLET(5 MG) BY MOUTH DAILY What changed: See the new instructions.  aspirin 81 MG tablet Take 81 mg by mouth daily.   brimonidine 0.2 % ophthalmic solution Commonly known as: ALPHAGAN Place 1 drop into both eyes 3 (three) times daily.   empagliflozin 25 MG Tabs tablet Commonly known as: Jardiance Take 1 tablet (25 mg total) by mouth daily before breakfast.   ferrous sulfate 325 (65 FE) MG tablet Take 325 mg by mouth every other day.   glimepiride 1 MG tablet Commonly known as: AMARYL Take 1 mg by mouth every morning.   hydrALAZINE 50 MG tablet Commonly known as: APRESOLINE TAKE 1 TABLET(50 MG) BY MOUTH THREE TIMES DAILY What changed: See the new instructions.   latanoprost  0.005 % ophthalmic solution Commonly known as: XALATAN Place 1 drop into both eyes at bedtime.   levothyroxine 150 MCG tablet Commonly known as: SYNTHROID Take 150 mcg by mouth daily.   Magnesium Oxide 400 MG Caps Take 1 capsule by mouth daily.   metFORMIN 1000 MG tablet Commonly known as: GLUCOPHAGE Take 500 mg by mouth 2 (two) times daily with a meal.   Metoprolol Tartrate 75 MG Tabs Take 75 mg by mouth 2 (two) times daily.   oxyCODONE 5 MG immediate release tablet Commonly known as: Oxy IR/ROXICODONE Take 2.5 mg by mouth 4 (four) times daily as needed for moderate pain, severe pain or breakthrough pain.   Rosuvastatin Calcium 20 MG Cpsp Take 20 mg by mouth daily.   sevelamer carbonate 800 MG tablet Commonly known as: RENVELA Take 1 tablet (800 mg total) by mouth 3 (three) times daily with meals.   tamsulosin 0.4 MG Caps capsule Commonly known as: FLOMAX Take 0.4 mg by mouth daily.               Durable Medical Equipment  (From admission, onward)           Start     Ordered   09/29/22 1556  For home use only DME Shower stool  Once        09/29/22 1555              Discharge Care Instructions  (From admission, onward)           Start     Ordered   10/01/22 0000  No dressing needed        10/01/22 1022            Follow-up Information     [facility] Follow up.   Specialty: Home Health Services Why: Frances Furbish will contact you within 48 hours of DC to schedule initial home visit for Nursing; Aide;PT and OT Contact information: [address] [place] [address] [phone]         [facility] Follow up.   Why: [employer] has provided you ith the rolling walker and bedside commode Contact information: [address] [place]  [place]  [address]        Johney Maine, MD Follow up on 10/05/2022.   Specialties: Hematology, Oncology Why: appt at 9 am Contact information: [address]  [place] [address] [phone]                Allergies  Allergen Reactions   Hctz [Hydrochlorothiazide] Other (See Comments)    Pancreatitis      Other Procedures/Studies: ECHOCARDIOGRAM COMPLETE  Result Date: 09/30/2022    ECHOCARDIOGRAM REPORT   Patient Name:   CALLEN RAWLING Date of Exam: 09/30/2022 Medical Rec #:  [id number]  Height:       66.0 in Accession #:    [id number]      Weight:       198.6 lb Date of Birth:  1950-06-17       BSA:          1.994 m Patient Age:    71 years        BP:           153/79 mmHg Patient Gender: M               HR:           107 bpm. Exam Location:  Inpatient Procedure: 2D Echo, Color Doppler, Cardiac Doppler and Intracardiac            Opacification Agent Indications:    Chest pain  History:        Patient has prior history of Echocardiogram examinations, most                 recent 09/23/2019. Arrythmias:Tachycardia, Signs/Symptoms:Chest                 Pain; Risk Factors:Diabetes, Hypertension and Sleep Apnea.  Sonographer:    Milda Smart Referring Phys: Jeoffrey Massed, M  Sonographer Comments: Suboptimal apical window. IMPRESSIONS  1. Left ventricular ejection fraction, by estimation, is 60 to 65%. The left ventricle has normal function. The left ventricle has no regional wall motion abnormalities. There is moderate left ventricular hypertrophy. Left ventricular diastolic parameters are indeterminate.  2. Right ventricular systolic function is normal. The right ventricular size is normal. Tricuspid regurgitation signal is inadequate for assessing PA pressure.  3. The mitral valve is grossly normal. No evidence of mitral valve regurgitation. No evidence of mitral stenosis.  4. The aortic valve is tricuspid. There is mild calcification of the aortic valve. There is mild thickening of the aortic valve. Aortic valve regurgitation is not visualized. Aortic valve sclerosis is present, with no evidence of aortic valve stenosis.  5. The  inferior vena cava is normal in size with greater than 50% respiratory variability, suggesting right atrial pressure of 3 mmHg.  6. Cannot exclude a small PFO. FINDINGS  Left Ventricle: Left ventricular ejection fraction, by estimation, is 60 to 65%. The left ventricle has normal function. The left ventricle has no regional wall motion abnormalities. Definity contrast agent was given IV to delineate the left ventricular  endocardial borders. The left ventricular internal cavity size was normal in size. There is moderate left ventricular hypertrophy. Left ventricular diastolic parameters are indeterminate. Right Ventricle: The right ventricular size is normal. No increase in right ventricular wall thickness. Right ventricular systolic function is normal. Tricuspid regurgitation signal is inadequate for assessing PA pressure. Left Atrium: Left atrial size was normal in size. Right Atrium: Right atrial size was normal in size. Pericardium: Trivial pericardial effusion is present. The pericardial effusion is circumferential. Mitral Valve: The mitral valve is grossly normal. No evidence of mitral valve regurgitation. No evidence of mitral valve stenosis. Tricuspid Valve: The tricuspid valve is normal in structure. Tricuspid valve regurgitation is not demonstrated. No evidence of tricuspid stenosis. Aortic Valve: The aortic valve is tricuspid. There is mild calcification of the aortic valve. There is mild thickening of the aortic valve. Aortic valve regurgitation is not visualized. Aortic valve sclerosis is present, with no evidence of aortic valve stenosis. Pulmonic Valve: The pulmonic valve was normal in structure. Pulmonic valve regurgitation is mild. No evidence of pulmonic stenosis. Aorta: The aortic  root and ascending aorta are structurally normal, with no evidence of dilitation. Venous: The inferior vena cava is normal in size with greater than 50% respiratory variability, suggesting right atrial pressure of 3  mmHg. IAS/Shunts: Cannot exclude a small PFO.  LEFT VENTRICLE PLAX 2D LVIDd:         4.30 cm LVIDs:         3.20 cm LV PW:         1.10 cm LV IVS:        1.00 cm LVOT diam:     2.20 cm LV SV:         56 LV SV Index:   28 LVOT Area:     3.80 cm  RIGHT VENTRICLE             IVC RV S prime:     13.50 cm/s  IVC diam: 1.50 cm TAPSE (M-mode): 1.4 cm LEFT ATRIUM             Index        RIGHT ATRIUM          Index LA diam:        3.70 cm 1.86 cm/m   RA Area:     7.86 cm LA Vol (A2C):   58.2 ml 29.19 ml/m  RA Volume:   12.20 ml 6.12 ml/m LA Vol (A4C):   48.1 ml 24.12 ml/m LA Biplane Vol: 52.8 ml 26.48 ml/m  AORTIC VALVE             PULMONIC VALVE LVOT Vmax:   104.00 cm/s PR End Diast Vel: 7.29 msec LVOT Vmean:  75.700 cm/s LVOT VTI:    0.148 m  AORTA Ao Root diam: 3.00 cm Ao Asc diam:  2.90 cm  SHUNTS Systemic VTI:  0.15 m Systemic Diam: 2.20 cm Riley Lam MD Electronically signed by Riley Lam MD Signature Date/Time: 09/30/2022/12:56:37 PM    Final    CT BONE MARROW BIOPSY & ASPIRATION  Result Date: 09/29/2022 INDICATION: 72 year old male with multifocal lytic lesions and hypercalcemia as well as elevated kappa light change. He presents for bone marrow biopsy. EXAM: CT GUIDED BONE MARROW ASPIRATION AND CORE BIOPSY Interventional Radiologist:  Sterling Big, MD MEDICATIONS: None. ANESTHESIA/SEDATION: Moderate (conscious) sedation was employed during this procedure. A total of 1 milligrams versed and 25 micrograms fentanyl were administered intravenously. The patient's level of consciousness and vital signs were monitored continuously by radiology nursing throughout the procedure under my direct supervision. Total monitored sedation time: 14 minutes FLUOROSCOPY: None. COMPLICATIONS: None immediate. Estimated blood loss: <25 mL PROCEDURE: Informed written consent was obtained from the patient after a thorough discussion of the procedural risks, benefits and alternatives. All questions were  addressed. Maximal Sterile Barrier Technique was utilized including caps, mask, sterile gowns, sterile gloves, sterile drape, hand hygiene and skin antiseptic. A timeout was performed prior to the initiation of the procedure. The patient was positioned prone and non-contrast localization CT was performed of the pelvis to demonstrate the iliac marrow spaces. Maximal barrier sterile technique utilized including caps, mask, sterile gowns, sterile gloves, large sterile drape, hand hygiene, and betadine prep. Under sterile conditions and local anesthesia, an 11 gauge coaxial bone biopsy needle was advanced into the right iliac marrow space. Needle position was confirmed with CT imaging. Initially, bone marrow aspiration was performed. Next, the 11 gauge outer cannula was utilized to obtain a right iliac bone marrow core biopsy. Needle was removed. Hemostasis was obtained with compression. The patient  tolerated the procedure well. Samples were prepared with the cytotechnologist. IMPRESSION: CT-guided bone marrow aspiration and core biopsy. Electronically Signed   By: Malachy Moan M.D.   On: 09/29/2022 10:49   CT CHEST WO CONTRAST  Result Date: 09/26/2022 CLINICAL DATA:  Abnormal xray - lung opacity/opacities EXAM: CT CHEST WITHOUT CONTRAST TECHNIQUE: Multidetector CT imaging of the chest was performed following the standard protocol without IV contrast. RADIATION DOSE REDUCTION: This exam was performed according to the departmental dose-optimization program which includes automated exposure control, adjustment of the mA and/or kV according to patient size and/or use of iterative reconstruction technique. COMPARISON:  Chest x-ray 09/08/2022 FINDINGS: Cardiovascular: Heart is normal size. Aorta is normal caliber. Aortic atherosclerosis. Mediastinum/Nodes: No mediastinal, hilar, or axillary adenopathy. Trachea and esophagus are unremarkable. Thyroid unremarkable. Lungs/Pleura: No confluent airspace opacities or  effusions. No pulmonary nodules or masses. Upper Abdomen: No acute findings Musculoskeletal: Chest wall soft tissues are unremarkable. The density seen on prior chest x-ray reflects and anterior right 2nd rib lesion with extension into the adjacent soft tissues and protrusion into the right upper lung. This measures 4 cm in greatest diameter. There are innumerable lytic lesions throughout the entire visible osseous structures occluding thoracic spine, all ribs, scapulae bilaterally, proximal humeri, clavicles and sternum. IMPRESSION: Abnormality seen on chest x-ray reflects bone destruction and soft tissue mass extending from the anterior right 2nd rib. Innumerable diffuse lytic lesions throughout the visualized bony skeleton. Findings are compatible with metastases or myeloma. Recommend clinical correlation. Aortic Atherosclerosis (ICD10-I70.0). Electronically Signed   By: Charlett Nose M.D.   On: 09/26/2022 09:15   DG HIP UNILAT WITH PELVIS 2-3 VIEWS LEFT  Result Date: 09/26/2022 CLINICAL DATA:  Left hip pain EXAM: DG HIP (WITH OR WITHOUT PELVIS) 2-3V LEFT COMPARISON:  None Available. FINDINGS: Normal alignment. No acute fracture or dislocation. Sacroiliac and hip joint spaces are preserved. Soft tissues are unremarkable. IMPRESSION: 1. Negative. Electronically Signed   By: Helyn Numbers M.D.   On: 09/26/2022 00:08   US RENAL  Result Date: 09/26/2022 CLINICAL DATA:  Acute kidney injury EXAM: RENAL / URINARY TRACT ULTRASOUND COMPLETE COMPARISON:  None Available. FINDINGS: Right Kidney: Renal measurements: 10.6 x 5.1 x 5.8 cm = volume: 162 mL. Echogenicity within normal limits. No mass or hydronephrosis visualized. Left Kidney: Renal measurements: 10.4 x 6.3 x 6.7 cm = volume: 228 mL. Echogenicity within normal limits. No mass or hydronephrosis visualized. Bladder: Foley catheter balloon is seen within a decompressed bladder lumen. Other: None. IMPRESSION: 1. Normal renal sonogram. Electronically Signed    By: Helyn Numbers M.D.   On: 09/26/2022 00:07   DG Chest 2 View  Result Date: 09/08/2022 CLINICAL DATA:  Left-sided chest pain. EXAM: CHEST - 2 VIEW COMPARISON:  Chest radiograph 02/17/2015 FINDINGS: The heart is mildly enlarged. The upper mediastinal contours are normal. There is ill-defined opacity in the right upper lobe measuring up to approximately 2.5 cm, new since the prior study. There is no other focal airspace opacity. There is no pulmonary edema. There is no pleural effusion or pneumothorax There is no acute osseous abnormality. IMPRESSION: 1. 2.5 cm opacity in the right upper lobe may be artifactual; however, recommend CT of the chest to exclude a parenchymal mass. 2. Mild cardiomegaly. Electronically Signed   By: Lesia Hausen M.D.   On: 09/08/2022 11:18     TODAY-DAY OF DISCHARGE:  Subjective:   Ahmar Sieg today has no headache,no chest abdominal pain,no new weakness tingling or numbness, feels much  better wants to go home today.   Objective:   Blood pressure 137/74, pulse (!) 106, temperature 98.3 F (36.8 C), temperature source Oral, resp. rate 16, height 5\' 6"  (1.676 m), weight 90.1 kg, SpO2 (!) 75 %.  Intake/Output Summary (Last 24 hours) at 10/01/2022 1022 Last data filed at 10/01/2022 0400 Gross per 24 hour  Intake --  Output 1200 ml  Net -1200 ml   Filed Weights   09/25/22 2100 09/25/22 2114 09/28/22 0300  Weight: 87.6 kg 87.6 kg 90.1 kg    Exam: Awake Alert, Oriented *3, No new F.N deficits, Normal affect [place].AT,PERRAL Supple Neck,No JVD, No cervical lymphadenopathy appriciated.  Symmetrical Chest wall movement, Good air movement bilaterally, CTAB RRR,No Gallops,Rubs or new Murmurs, No Parasternal Heave +ve B.Sounds, Abd Soft, Non tender, No organomegaly appriciated, No rebound -guarding or rigidity. No Cyanosis, Clubbing or edema, No new Rash or bruise   PERTINENT RADIOLOGIC STUDIES: ECHOCARDIOGRAM COMPLETE  Result Date: 09/30/2022    ECHOCARDIOGRAM  REPORT   Patient Name:   YEELENG SMET Date of Exam: 09/30/2022 Medical Rec #:  [id number]       Height:       66.0 in Accession #:    [id number]      Weight:       198.6 lb Date of Birth:  Apr 26, 1951       BSA:          1.994 m Patient Age:    71 years        BP:           153/79 mmHg Patient Gender: M               HR:           107 bpm. Exam Location:  Inpatient Procedure: 2D Echo, Color Doppler, Cardiac Doppler and Intracardiac            Opacification Agent Indications:    Chest pain  History:        Patient has prior history of Echocardiogram examinations, most                 recent 09/23/2019. Arrythmias:Tachycardia, Signs/Symptoms:Chest                 Pain; Risk Factors:Diabetes, Hypertension and Sleep Apnea.  Sonographer:    Milda Smart Referring Phys: Jeoffrey Massed, M  Sonographer Comments: Suboptimal apical window. IMPRESSIONS  1. Left ventricular ejection fraction, by estimation, is 60 to 65%. The left ventricle has normal function. The left ventricle has no regional wall motion abnormalities. There is moderate left ventricular hypertrophy. Left ventricular diastolic parameters are indeterminate.  2. Right ventricular systolic function is normal. The right ventricular size is normal. Tricuspid regurgitation signal is inadequate for assessing PA pressure.  3. The mitral valve is grossly normal. No evidence of mitral valve regurgitation. No evidence of mitral stenosis.  4. The aortic valve is tricuspid. There is mild calcification of the aortic valve. There is mild thickening of the aortic valve. Aortic valve regurgitation is not visualized. Aortic valve sclerosis is present, with no evidence of aortic valve stenosis.  5. The inferior vena cava is normal in size with greater than 50% respiratory variability, suggesting right atrial pressure of 3 mmHg.  6. Cannot exclude a small PFO. FINDINGS  Left Ventricle: Left ventricular ejection fraction, by estimation, is 60 to 65%. The left ventricle has  normal function. The left ventricle has no regional wall motion abnormalities. Definity contrast agent  was given IV to delineate the left ventricular  endocardial borders. The left ventricular internal cavity size was normal in size. There is moderate left ventricular hypertrophy. Left ventricular diastolic parameters are indeterminate. Right Ventricle: The right ventricular size is normal. No increase in right ventricular wall thickness. Right ventricular systolic function is normal. Tricuspid regurgitation signal is inadequate for assessing PA pressure. Left Atrium: Left atrial size was normal in size. Right Atrium: Right atrial size was normal in size. Pericardium: Trivial pericardial effusion is present. The pericardial effusion is circumferential. Mitral Valve: The mitral valve is grossly normal. No evidence of mitral valve regurgitation. No evidence of mitral valve stenosis. Tricuspid Valve: The tricuspid valve is normal in structure. Tricuspid valve regurgitation is not demonstrated. No evidence of tricuspid stenosis. Aortic Valve: The aortic valve is tricuspid. There is mild calcification of the aortic valve. There is mild thickening of the aortic valve. Aortic valve regurgitation is not visualized. Aortic valve sclerosis is present, with no evidence of aortic valve stenosis. Pulmonic Valve: The pulmonic valve was normal in structure. Pulmonic valve regurgitation is mild. No evidence of pulmonic stenosis. Aorta: The aortic root and ascending aorta are structurally normal, with no evidence of dilitation. Venous: The inferior vena cava is normal in size with greater than 50% respiratory variability, suggesting right atrial pressure of 3 mmHg. IAS/Shunts: Cannot exclude a small PFO.  LEFT VENTRICLE PLAX 2D LVIDd:         4.30 cm LVIDs:         3.20 cm LV PW:         1.10 cm LV IVS:        1.00 cm LVOT diam:     2.20 cm LV SV:         56 LV SV Index:   28 LVOT Area:     3.80 cm  RIGHT VENTRICLE             IVC  RV S prime:     13.50 cm/s  IVC diam: 1.50 cm TAPSE (M-mode): 1.4 cm LEFT ATRIUM             Index        RIGHT ATRIUM          Index LA diam:        3.70 cm 1.86 cm/m   RA Area:     7.86 cm LA Vol (A2C):   58.2 ml 29.19 ml/m  RA Volume:   12.20 ml 6.12 ml/m LA Vol (A4C):   48.1 ml 24.12 ml/m LA Biplane Vol: 52.8 ml 26.48 ml/m  AORTIC VALVE             PULMONIC VALVE LVOT Vmax:   104.00 cm/s PR End Diast Vel: 7.29 msec LVOT Vmean:  75.700 cm/s LVOT VTI:    0.148 m  AORTA Ao Root diam: 3.00 cm Ao Asc diam:  2.90 cm  SHUNTS Systemic VTI:  0.15 m Systemic Diam: 2.20 cm Riley Lam MD Electronically signed by Riley Lam MD Signature Date/Time: 09/30/2022/12:56:37 PM    Final      PERTINENT LAB RESULTS: CBC: Recent Labs    09/29/22 0412 10/01/22 0423  WBC 5.8 6.6  HGB 8.2* 8.1*  HCT 24.6* 24.4*  PLT 157 123*   CMET CMP     Component Value Date/Time   NA 129 (L) 10/01/2022 0423   K 3.7 10/01/2022 0423   CL 103 10/01/2022 0423   CO2 18 (L) 10/01/2022 0423   GLUCOSE  118 (H) 10/01/2022 0423   BUN 16 10/01/2022 0423   CREATININE 1.47 (H) 10/01/2022 0423   CREATININE 1.21 11/22/2015 0917   CALCIUM 8.3 (L) 10/01/2022 0423   PROT 7.6 11/22/2015 0917   ALBUMIN 2.2 (L) 10/01/2022 0423   AST 19 11/22/2015 0917   ALT 13 11/22/2015 0917   ALKPHOS 70 11/22/2015 0917   BILITOT 0.4 11/22/2015 0917   GFRNONAA 51 (L) 10/01/2022 0423   GFRAA >60 05/19/2015 1049    GFR Estimated Creatinine Clearance: 48.4 mL/min (A) (by C-G formula based on SCr of 1.47 mg/dL (H)). No results for input(s): "LIPASE", "AMYLASE" in the last 72 hours. No results for input(s): "CKTOTAL", "CKMB", "CKMBINDEX", "TROPONINI" in the last 72 hours. Invalid input(s): "POCBNP" No results for input(s): "DDIMER" in the last 72 hours. No results for input(s): "HGBA1C" in the last 72 hours. No results for input(s): "CHOL", "HDL", "LDLCALC", "TRIG", "CHOLHDL", "LDLDIRECT" in the last 72 hours. No results for  input(s): "TSH", "T4TOTAL", "T3FREE", "THYROIDAB" in the last 72 hours.  Invalid input(s): "FREET3" No results for input(s): "VITAMINB12", "FOLATE", "FERRITIN", "TIBC", "IRON", "RETICCTPCT" in the last 72 hours. Coags: No results for input(s): "INR" in the last 72 hours.  Invalid input(s): "PT" Microbiology: No results found for this or any previous visit (from the past 240 hour(s)).  FURTHER DISCHARGE INSTRUCTIONS:  Get Medicines reviewed and adjusted: Please take all your medications with you for your next visit with your Primary MD  Laboratory/radiological data: Please request your Primary MD to go over all hospital tests and procedure/radiological results at the follow up, please ask your Primary MD to get all Hospital records sent to his/her office.  In some cases, they will be blood work, cultures and biopsy results pending at the time of your discharge. Please request that your primary care M.D. goes through all the records of your hospital data and follows up on these results.  Also Note the following: If you experience worsening of your admission symptoms, develop shortness of breath, life threatening emergency, suicidal or homicidal thoughts you must seek medical attention immediately by calling 911 or calling your MD immediately  if symptoms less severe.  You must read complete instructions/literature along with all the possible adverse reactions/side effects for all the Medicines you take and that have been prescribed to you. Take any new Medicines after you have completely understood and accpet all the possible adverse reactions/side effects.   Do not drive when taking Pain medications or sleeping medications (Benzodaizepines)  Do not take more than prescribed Pain, Sleep and Anxiety Medications. It is not advisable to combine anxiety,sleep and pain medications without talking with your primary care practitioner  Special Instructions: If you have smoked or chewed Tobacco   in the last 2 yrs please stop smoking, stop any regular Alcohol  and or any Recreational drug use.  Wear Seat belts while driving.  Please note: You were cared for by a hospitalist during your hospital stay. Once you are discharged, your primary care physician will handle any further medical issues. Please note that NO REFILLS for any discharge medications will be authorized once you are discharged, as it is imperative that you return to your primary care physician (or establish a relationship with a primary care physician if you do not have one) for your post hospital discharge needs so that they can reassess your need for medications and monitor your lab values.  Total Time spent coordinating discharge including counseling, education and face to face time equals greater  than 30 minutes.  SignedJeoffrey Massed 10/01/2022 10:22 AM

## 2022-10-04 LAB — SURGICAL PATHOLOGY

## 2022-10-05 ENCOUNTER — Inpatient Hospital Stay: Payer: Medicare PPO

## 2022-10-05 ENCOUNTER — Inpatient Hospital Stay: Payer: Medicare PPO | Attending: Hematology | Admitting: Hematology

## 2022-10-05 VITALS — BP 102/58 | HR 102 | Temp 98.1°F

## 2022-10-05 DIAGNOSIS — I10 Essential (primary) hypertension: Secondary | ICD-10-CM

## 2022-10-05 DIAGNOSIS — M25552 Pain in left hip: Secondary | ICD-10-CM | POA: Insufficient documentation

## 2022-10-05 DIAGNOSIS — E119 Type 2 diabetes mellitus without complications: Secondary | ICD-10-CM | POA: Insufficient documentation

## 2022-10-05 DIAGNOSIS — R109 Unspecified abdominal pain: Secondary | ICD-10-CM | POA: Diagnosis not present

## 2022-10-05 DIAGNOSIS — H547 Unspecified visual loss: Secondary | ICD-10-CM | POA: Diagnosis not present

## 2022-10-05 DIAGNOSIS — Z8249 Family history of ischemic heart disease and other diseases of the circulatory system: Secondary | ICD-10-CM

## 2022-10-05 DIAGNOSIS — R42 Dizziness and giddiness: Secondary | ICD-10-CM | POA: Diagnosis not present

## 2022-10-05 DIAGNOSIS — C9000 Multiple myeloma not having achieved remission: Secondary | ICD-10-CM | POA: Diagnosis present

## 2022-10-05 DIAGNOSIS — Z7189 Other specified counseling: Secondary | ICD-10-CM

## 2022-10-05 DIAGNOSIS — Z833 Family history of diabetes mellitus: Secondary | ICD-10-CM | POA: Diagnosis not present

## 2022-10-05 DIAGNOSIS — I700 Atherosclerosis of aorta: Secondary | ICD-10-CM | POA: Insufficient documentation

## 2022-10-05 DIAGNOSIS — E039 Hypothyroidism, unspecified: Secondary | ICD-10-CM | POA: Diagnosis not present

## 2022-10-05 DIAGNOSIS — I499 Cardiac arrhythmia, unspecified: Secondary | ICD-10-CM | POA: Insufficient documentation

## 2022-10-05 DIAGNOSIS — I429 Cardiomyopathy, unspecified: Secondary | ICD-10-CM | POA: Diagnosis not present

## 2022-10-05 DIAGNOSIS — G4733 Obstructive sleep apnea (adult) (pediatric): Secondary | ICD-10-CM | POA: Insufficient documentation

## 2022-10-05 DIAGNOSIS — Z79899 Other long term (current) drug therapy: Secondary | ICD-10-CM | POA: Diagnosis not present

## 2022-10-05 DIAGNOSIS — Z803 Family history of malignant neoplasm of breast: Secondary | ICD-10-CM | POA: Insufficient documentation

## 2022-10-05 DIAGNOSIS — R300 Dysuria: Secondary | ICD-10-CM

## 2022-10-05 DIAGNOSIS — R5383 Other fatigue: Secondary | ICD-10-CM | POA: Diagnosis not present

## 2022-10-05 LAB — HIV ANTIBODY (ROUTINE TESTING W REFLEX): HIV Screen 4th Generation wRfx: NONREACTIVE

## 2022-10-05 LAB — CMP (CANCER CENTER ONLY)
ALT: 30 U/L (ref 0–44)
AST: 64 U/L — ABNORMAL HIGH (ref 15–41)
Albumin: 3.3 g/dL — ABNORMAL LOW (ref 3.5–5.0)
Alkaline Phosphatase: 69 U/L (ref 38–126)
Anion gap: 11 (ref 5–15)
BUN: 49 mg/dL — ABNORMAL HIGH (ref 8–23)
CO2: 18 mmol/L — ABNORMAL LOW (ref 22–32)
Calcium: 7.9 mg/dL — ABNORMAL LOW (ref 8.9–10.3)
Chloride: 104 mmol/L (ref 98–111)
Creatinine: 3.39 mg/dL — ABNORMAL HIGH (ref 0.61–1.24)
GFR, Estimated: 19 mL/min — ABNORMAL LOW (ref >60)
Glucose, Bld: 162 mg/dL — ABNORMAL HIGH (ref 70–99)
Potassium: 3.8 mmol/L (ref 3.5–5.1)
Sodium: 133 mmol/L — ABNORMAL LOW (ref 135–145)
Total Bilirubin: 0.5 mg/dL (ref 0.3–1.2)
Total Protein: 11.1 g/dL — ABNORMAL HIGH (ref 6.5–8.1)

## 2022-10-05 LAB — CBC WITH DIFFERENTIAL (CANCER CENTER ONLY)
Abs Immature Granulocytes: 0.24 10*3/uL — ABNORMAL HIGH (ref 0.00–0.07)
Basophils Absolute: 0 10*3/uL (ref 0.0–0.1)
Basophils Relative: 1 %
Eosinophils Absolute: 0.1 10*3/uL (ref 0.0–0.5)
Eosinophils Relative: 1 %
HCT: 24.5 % — ABNORMAL LOW (ref 39.0–52.0)
Hemoglobin: 8.1 g/dL — ABNORMAL LOW (ref 13.0–17.0)
Immature Granulocytes: 4 %
Lymphocytes Relative: 43 %
Lymphs Abs: 2.9 10*3/uL (ref 0.7–4.0)
MCH: 28.5 pg (ref 26.0–34.0)
MCHC: 33.1 g/dL (ref 30.0–36.0)
MCV: 86.3 fL (ref 80.0–100.0)
Monocytes Absolute: 0.8 10*3/uL (ref 0.1–1.0)
Monocytes Relative: 12 %
Neutro Abs: 2.6 10*3/uL (ref 1.7–7.7)
Neutrophils Relative %: 39 %
Platelet Count: 131 10*3/uL — ABNORMAL LOW (ref 150–400)
RBC: 2.84 MIL/uL — ABNORMAL LOW (ref 4.22–5.81)
RDW: 17.4 % — ABNORMAL HIGH (ref 11.5–15.5)
Smear Review: NORMAL
WBC Count: 6.5 10*3/uL (ref 4.0–10.5)
nRBC: 0.5 % — ABNORMAL HIGH (ref 0.0–0.2)

## 2022-10-05 LAB — TYPE AND SCREEN
ABO/RH(D): B POS
Antibody Screen: NEGATIVE

## 2022-10-05 LAB — PHOSPHORUS: Phosphorus: 2.5 mg/dL (ref 2.5–4.6)

## 2022-10-05 LAB — LACTATE DEHYDROGENASE: LDH: 509 U/L — ABNORMAL HIGH (ref 98–192)

## 2022-10-05 LAB — MAGNESIUM: Magnesium: 2.5 mg/dL — ABNORMAL HIGH (ref 1.7–2.4)

## 2022-10-05 MED ORDER — POLYETHYLENE GLYCOL 3350 17 G PO PACK
17.0000 g | PACK | Freq: Every day | ORAL | 0 refills | Status: DC
Start: 2022-10-05 — End: 2023-01-26

## 2022-10-05 MED ORDER — ONDANSETRON HCL 8 MG PO TABS
8.0000 mg | ORAL_TABLET | Freq: Three times a day (TID) | ORAL | 0 refills | Status: DC | PRN
Start: 2022-10-05 — End: 2022-12-29

## 2022-10-05 NOTE — Progress Notes (Signed)
HEMATOLOGY/ONCOLOGY CONSULTATION NOTE  Date of Service: 10/05/2022  Patient Care Team: Tracey Harries, MD as PCP - General (Family Medicine)  CHIEF COMPLAINTS/PURPOSE OF CONSULTATION:  Evaluation and management of multiple myeloma.  HISTORY OF PRESENTING ILLNESS:  Scott Bass is a wonderful 72 y.o. male who has been referred to [place] by Tracey Harries, MD for evaluation and management of multiple myeloma.  Patient was in ED for symptomatic anemia on 09/08/2022. Patient presented to the ED and was hospitalized on 09/25/2022 for acute renal failure.   Today, he presents in a wheelchair and is accompanied by his wife. He reports that he is intermittently nauseous. He does not take anything to manage nausea. He denies any acid reflux symptoms.  He does have mild abdominal pain. He has had one bowel movement this week and does endorse a cycle of constipation and diarrhea. He reports recent poor p.o intake due to loss of appetite over the last month. He reports a weight loss of 5-6 pounds. He denies any leg swelling, radiation exposure, or neuropathy.  He reports that receiving blood transfusions during his hospitalization did improve energy levels. He was given Oxycodone at the hospital which he has run out of. He currently uses Tylenol for pain management.   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 Patient's wife is also blind. He reports that he is able to manage daily activities with his wife at home.   Patient suffered a fall from a chair after it broke. He reports worsened fatigue which began one month ago. He denies any syncope or lightheadedness/dizziness.  He complains of chest wall pain as well as left hip pain in the groin and laterally. This pain has improved.   His DM2 is fairly well-controlled. He reports that he needs a Metformin refill. He has self-discontinued Jardiance 1-2 months ago as he thought that it would affect blood sugars levels. His blood sugars at home have  been fairly stable with its highest level at 145.   His wife reports that patient has frequent urination with a burning sensation. Patient's wife reports that his confusion has improved by 80%. Patient reports that he is occasionally confused. She reports that patient may need assistance with bedside commode. Patient reports that he hospital has initiated home care services. Patient reports that he does not have a nephrologist.  MEDICAL HISTORY:  Past Medical History:  Diagnosis Date   Arthritis    "lower back" (12/18/2017)   Better eye: moderate vision impairment; lesser eye: blind    GERD (gastroesophageal reflux disease)    Glaucoma, both eyes    High cholesterol    Hypertension    Hypothyroidism    Irregular heartbeat    Legally blind    "both eyes; can see some out of left eye"   OSA (obstructive sleep apnea)    Thyroid disease    Type II diabetes mellitus (HCC)    Vitamin B12 deficiency     SURGICAL HISTORY: Past Surgical History:  Procedure Laterality Date   LEFT HEART CATH AND CORONARY ANGIOGRAPHY N/A 12/18/2017   Procedure: LEFT HEART CATH AND CORONARY ANGIOGRAPHY;  Surgeon: Elder Negus, MD;  Location: [facility];  Service: Cardiovascular;  Laterality: N/A;   NOSE SURGERY     "was crooked; they straightened it out" (12/18/2017)    SOCIAL HISTORY: Social History   Socioeconomic History   Marital status: Married    Spouse name: Not on file   Number of children: 0  Years of education: Not on file   Highest education level: Not on file  Occupational History   Occupation: retired  Tobacco Use   Smoking status: Never   Smokeless tobacco: Never  Vaping Use   Vaping Use: Never used  Substance and Sexual Activity   Alcohol use: Yes    Comment: occ   Drug use: Never   Sexual activity: Not Currently  Other Topics Concern   Not on file  Social History Narrative   Not on file   Social Determinants of Health   Financial Resource Strain: Not on  file  Food Insecurity: Not on file  Transportation Needs: Not on file  Physical Activity: Not on file  Stress: Not on file  Social Connections: Not on file  Intimate Partner Violence: Not on file    FAMILY HISTORY: Family History  Problem Relation Age of Onset   Diabetes Mellitus II Mother 46   Diabetes Mellitus II Sister    Breast cancer Sister    Heart failure Sister 23    ALLERGIES:  is allergic to hctz [hydrochlorothiazide].  MEDICATIONS:  Current Outpatient Medications  Medication Sig Dispense Refill   amLODipine (NORVASC) 5 MG tablet TAKE 1 TABLET(5 MG) BY MOUTH DAILY (Patient taking differently: Take 5 mg by mouth daily.) 30 tablet 11   aspirin 81 MG tablet Take 81 mg by mouth daily.     brimonidine (ALPHAGAN) 0.2 % ophthalmic solution Place 1 drop into both eyes 3 (three) times daily.   0   empagliflozin (JARDIANCE) 25 MG TABS tablet Take 1 tablet (25 mg total) by mouth daily before breakfast. 30 tablet 0   ferrous sulfate 325 (65 FE) MG tablet Take 325 mg by mouth every other day.     glimepiride (AMARYL) 1 MG tablet Take 1 mg by mouth every morning.     hydrALAZINE (APRESOLINE) 50 MG tablet TAKE 1 TABLET(50 MG) BY MOUTH THREE TIMES DAILY (Patient taking differently: Take 50 mg by mouth 3 (three) times daily.) 270 tablet 2   latanoprost (XALATAN) 0.005 % ophthalmic solution Place 1 drop into both eyes at bedtime. (Patient not taking: Reported on 09/26/2022)     levothyroxine (SYNTHROID, LEVOTHROID) 150 MCG tablet Take 150 mcg by mouth daily.  0   Magnesium Oxide 400 MG CAPS Take 1 capsule by mouth daily.     metFORMIN (GLUCOPHAGE) 1000 MG tablet Take 500 mg by mouth 2 (two) times daily with a meal.      Metoprolol Tartrate 75 MG TABS Take 75 mg by mouth 2 (two) times daily.      oxyCODONE (OXY IR/ROXICODONE) 5 MG immediate release tablet Take 2.5 mg by mouth 4 (four) times daily as needed for moderate pain, severe pain or breakthrough pain.     Rosuvastatin Calcium 20 MG  CPSP Take 20 mg by mouth daily.      sevelamer carbonate (RENVELA) 800 MG tablet Take 1 tablet (800 mg total) by mouth 3 (three) times daily with meals. 90 tablet 0   tamsulosin (FLOMAX) 0.4 MG CAPS capsule Take 0.4 mg by mouth daily.     No current facility-administered medications for this visit.    REVIEW OF SYSTEMS:    10 Point review of Systems was done is negative except as noted above.  PHYSICAL EXAMINATION: ECOG PERFORMANCE STATUS: 3 - Symptomatic, >50% confined to bed  . Vitals:   10/05/22 0935  BP: (!) 102/58  Pulse: (!) 102  Temp: 98.1 F (36.7 C)  SpO2: 100%  Filed Weights   .There is no height or weight on file to calculate BMI.  GENERAL:alert, in no acute distress and comfortable SKIN: no acute rashes, no significant lesions EYES: conjunctiva are pink and non-injected, sclera anicteric OROPHARYNX: MMM, no exudates, no oropharyngeal erythema or ulceration NECK: supple, no JVD LYMPH:  no palpable lymphadenopathy in the cervical, axillary or inguinal regions LUNGS: clear to auscultation b/l with normal respiratory effort HEART: regular rate & rhythm ABDOMEN:  normoactive bowel sounds , non tender, not distended. Extremity: no pedal edema PSYCH: alert & oriented x 3 with fluent speech NEURO: no focal motor/sensory deficits  LABORATORY DATA:  I have reviewed the data as listed .    Latest Ref Rng & Units 10/05/2022   10:17 AM 10/01/2022    4:23 AM 09/29/2022    4:12 AM  CBC  WBC 4.0 - 10.5 K/uL 6.5  6.6  5.8   Hemoglobin 13.0 - 17.0 g/dL 8.1  8.1  8.2   Hematocrit 39.0 - 52.0 % 24.5  24.4  24.6   Platelets 150 - 400 K/uL 131  123  157    .    Latest Ref Rng & Units 10/05/2022   10:17 AM 10/01/2022    4:23 AM 09/30/2022    2:30 AM  CMP  Glucose 70 - 99 mg/dL 865  784  696   BUN 8 - 23 mg/dL 49  16  16   Creatinine 0.61 - 1.24 mg/dL 2.95  2.84  1.32   Sodium 135 - 145 mmol/L 133  129  132   Potassium 3.5 - 5.1 mmol/L 3.8  3.7  2.9   Chloride 98 -  111 mmol/L 104  103  102   CO2 22 - 32 mmol/L 18  18  19    Calcium 8.9 - 10.3 mg/dL 7.9  8.3  9.8   Total Protein 6.5 - 8.1 g/dL 44.0     Total Bilirubin 0.3 - 1.2 mg/dL 0.5     Alkaline Phos 38 - 126 U/L 69     AST 15 - 41 U/L 64     ALT 0 - 44 U/L 30        Bone Marrow Biopsy 09/29/2022:    RADIOGRAPHIC STUDIES: I have personally reviewed the radiological images as listed and agreed with the findings in the report. ECHOCARDIOGRAM COMPLETE  Result Date: 09/30/2022    ECHOCARDIOGRAM REPORT   Patient Name:   SIGURD VILLARI Date of Exam: 09/30/2022 Medical Rec #:  [id number]       Height:       66.0 in Accession #:    [id number]      Weight:       198.6 lb Date of Birth:  Aug 25, 1950       BSA:          1.994 m Patient Age:    71 years        BP:           153/79 mmHg Patient Gender: M               HR:           107 bpm. Exam Location:  Inpatient Procedure: 2D Echo, Color Doppler, Cardiac Doppler and Intracardiac            Opacification Agent Indications:    Chest pain  History:        Patient has prior history of Echocardiogram examinations, most  recent 09/23/2019. Arrythmias:Tachycardia, Signs/Symptoms:Chest                 Pain; Risk Factors:Diabetes, Hypertension and Sleep Apnea.  Sonographer:    Milda Smart Referring Phys: Jeoffrey Massed, M  Sonographer Comments: Suboptimal apical window. IMPRESSIONS  1. Left ventricular ejection fraction, by estimation, is 60 to 65%. The left ventricle has normal function. The left ventricle has no regional wall motion abnormalities. There is moderate left ventricular hypertrophy. Left ventricular diastolic parameters are indeterminate.  2. Right ventricular systolic function is normal. The right ventricular size is normal. Tricuspid regurgitation signal is inadequate for assessing PA pressure.  3. The mitral valve is grossly normal. No evidence of mitral valve regurgitation. No evidence of mitral stenosis.  4. The aortic valve is  tricuspid. There is mild calcification of the aortic valve. There is mild thickening of the aortic valve. Aortic valve regurgitation is not visualized. Aortic valve sclerosis is present, with no evidence of aortic valve stenosis.  5. The inferior vena cava is normal in size with greater than 50% respiratory variability, suggesting right atrial pressure of 3 mmHg.  6. Cannot exclude a small PFO. FINDINGS  Left Ventricle: Left ventricular ejection fraction, by estimation, is 60 to 65%. The left ventricle has normal function. The left ventricle has no regional wall motion abnormalities. Definity contrast agent was given IV to delineate the left ventricular  endocardial borders. The left ventricular internal cavity size was normal in size. There is moderate left ventricular hypertrophy. Left ventricular diastolic parameters are indeterminate. Right Ventricle: The right ventricular size is normal. No increase in right ventricular wall thickness. Right ventricular systolic function is normal. Tricuspid regurgitation signal is inadequate for assessing PA pressure. Left Atrium: Left atrial size was normal in size. Right Atrium: Right atrial size was normal in size. Pericardium: Trivial pericardial effusion is present. The pericardial effusion is circumferential. Mitral Valve: The mitral valve is grossly normal. No evidence of mitral valve regurgitation. No evidence of mitral valve stenosis. Tricuspid Valve: The tricuspid valve is normal in structure. Tricuspid valve regurgitation is not demonstrated. No evidence of tricuspid stenosis. Aortic Valve: The aortic valve is tricuspid. There is mild calcification of the aortic valve. There is mild thickening of the aortic valve. Aortic valve regurgitation is not visualized. Aortic valve sclerosis is present, with no evidence of aortic valve stenosis. Pulmonic Valve: The pulmonic valve was normal in structure. Pulmonic valve regurgitation is mild. No evidence of pulmonic stenosis.  Aorta: The aortic root and ascending aorta are structurally normal, with no evidence of dilitation. Venous: The inferior vena cava is normal in size with greater than 50% respiratory variability, suggesting right atrial pressure of 3 mmHg. IAS/Shunts: Cannot exclude a small PFO.  LEFT VENTRICLE PLAX 2D LVIDd:         4.30 cm LVIDs:         3.20 cm LV PW:         1.10 cm LV IVS:        1.00 cm LVOT diam:     2.20 cm LV SV:         56 LV SV Index:   28 LVOT Area:     3.80 cm  RIGHT VENTRICLE             IVC RV S prime:     13.50 cm/s  IVC diam: 1.50 cm TAPSE (M-mode): 1.4 cm LEFT ATRIUM             Index  RIGHT ATRIUM          Index LA diam:        3.70 cm 1.86 cm/m   RA Area:     7.86 cm LA Vol (A2C):   58.2 ml 29.19 ml/m  RA Volume:   12.20 ml 6.12 ml/m LA Vol (A4C):   48.1 ml 24.12 ml/m LA Biplane Vol: 52.8 ml 26.48 ml/m  AORTIC VALVE             PULMONIC VALVE LVOT Vmax:   104.00 cm/s PR End Diast Vel: 7.29 msec LVOT Vmean:  75.700 cm/s LVOT VTI:    0.148 m  AORTA Ao Root diam: 3.00 cm Ao Asc diam:  2.90 cm  SHUNTS Systemic VTI:  0.15 m Systemic Diam: 2.20 cm Riley Lam MD Electronically signed by Riley Lam MD Signature Date/Time: 09/30/2022/12:56:37 PM    Final    CT BONE MARROW BIOPSY & ASPIRATION  Result Date: 09/29/2022 INDICATION: 72 year old male with multifocal lytic lesions and hypercalcemia as well as elevated kappa light change. He presents for bone marrow biopsy. EXAM: CT GUIDED BONE MARROW ASPIRATION AND CORE BIOPSY Interventional Radiologist:  Sterling Big, MD MEDICATIONS: None. ANESTHESIA/SEDATION: Moderate (conscious) sedation was employed during this procedure. A total of 1 milligrams versed and 25 micrograms fentanyl were administered intravenously. The patient's level of consciousness and vital signs were monitored continuously by radiology nursing throughout the procedure under my direct supervision. Total monitored sedation time: 14 minutes  FLUOROSCOPY: None. COMPLICATIONS: None immediate. Estimated blood loss: <25 mL PROCEDURE: Informed written consent was obtained from the patient after a thorough discussion of the procedural risks, benefits and alternatives. All questions were addressed. Maximal Sterile Barrier Technique was utilized including caps, mask, sterile gowns, sterile gloves, sterile drape, hand hygiene and skin antiseptic. A timeout was performed prior to the initiation of the procedure. The patient was positioned prone and non-contrast localization CT was performed of the pelvis to demonstrate the iliac marrow spaces. Maximal barrier sterile technique utilized including caps, mask, sterile gowns, sterile gloves, large sterile drape, hand hygiene, and betadine prep. Under sterile conditions and local anesthesia, an 11 gauge coaxial bone biopsy needle was advanced into the right iliac marrow space. Needle position was confirmed with CT imaging. Initially, bone marrow aspiration was performed. Next, the 11 gauge outer cannula was utilized to obtain a right iliac bone marrow core biopsy. Needle was removed. Hemostasis was obtained with compression. The patient tolerated the procedure well. Samples were prepared with the cytotechnologist. IMPRESSION: CT-guided bone marrow aspiration and core biopsy. Electronically Signed   By: Malachy Moan M.D.   On: 09/29/2022 10:49   CT CHEST WO CONTRAST  Result Date: 09/26/2022 CLINICAL DATA:  Abnormal xray - lung opacity/opacities EXAM: CT CHEST WITHOUT CONTRAST TECHNIQUE: Multidetector CT imaging of the chest was performed following the standard protocol without IV contrast. RADIATION DOSE REDUCTION: This exam was performed according to the departmental dose-optimization program which includes automated exposure control, adjustment of the mA and/or kV according to patient size and/or use of iterative reconstruction technique. COMPARISON:  Chest x-ray 09/08/2022 FINDINGS: Cardiovascular: Heart is  normal size. Aorta is normal caliber. Aortic atherosclerosis. Mediastinum/Nodes: No mediastinal, hilar, or axillary adenopathy. Trachea and esophagus are unremarkable. Thyroid unremarkable. Lungs/Pleura: No confluent airspace opacities or effusions. No pulmonary nodules or masses. Upper Abdomen: No acute findings Musculoskeletal: Chest wall soft tissues are unremarkable. The density seen on prior chest x-ray reflects and anterior right 2nd rib lesion with extension into the adjacent  soft tissues and protrusion into the right upper lung. This measures 4 cm in greatest diameter. There are innumerable lytic lesions throughout the entire visible osseous structures occluding thoracic spine, all ribs, scapulae bilaterally, proximal humeri, clavicles and sternum. IMPRESSION: Abnormality seen on chest x-ray reflects bone destruction and soft tissue mass extending from the anterior right 2nd rib. Innumerable diffuse lytic lesions throughout the visualized bony skeleton. Findings are compatible with metastases or myeloma. Recommend clinical correlation. Aortic Atherosclerosis (ICD10-I70.0). Electronically Signed   By: Charlett Nose M.D.   On: 09/26/2022 09:15   DG HIP UNILAT WITH PELVIS 2-3 VIEWS LEFT  Result Date: 09/26/2022 CLINICAL DATA:  Left hip pain EXAM: DG HIP (WITH OR WITHOUT PELVIS) 2-3V LEFT COMPARISON:  None Available. FINDINGS: Normal alignment. No acute fracture or dislocation. Sacroiliac and hip joint spaces are preserved. Soft tissues are unremarkable. IMPRESSION: 1. Negative. Electronically Signed   By: Helyn Numbers M.D.   On: 09/26/2022 00:08   US RENAL  Result Date: 09/26/2022 CLINICAL DATA:  Acute kidney injury EXAM: RENAL / URINARY TRACT ULTRASOUND COMPLETE COMPARISON:  None Available. FINDINGS: Right Kidney: Renal measurements: 10.6 x 5.1 x 5.8 cm = volume: 162 mL. Echogenicity within normal limits. No mass or hydronephrosis visualized. Left Kidney: Renal measurements: 10.4 x 6.3 x 6.7 cm =  volume: 228 mL. Echogenicity within normal limits. No mass or hydronephrosis visualized. Bladder: Foley catheter balloon is seen within a decompressed bladder lumen. Other: None. IMPRESSION: 1. Normal renal sonogram. Electronically Signed   By: Helyn Numbers M.D.   On: 09/26/2022 00:07   DG Chest 2 View  Result Date: 09/08/2022 CLINICAL DATA:  Left-sided chest pain. EXAM: CHEST - 2 VIEW COMPARISON:  Chest radiograph 02/17/2015 FINDINGS: The heart is mildly enlarged. The upper mediastinal contours are normal. There is ill-defined opacity in the right upper lobe measuring up to approximately 2.5 cm, new since the prior study. There is no other focal airspace opacity. There is no pulmonary edema. There is no pleural effusion or pneumothorax There is no acute osseous abnormality. IMPRESSION: 1. 2.5 cm opacity in the right upper lobe may be artifactual; however, recommend CT of the chest to exclude a parenchymal mass. 2. Mild cardiomegaly. Electronically Signed   By: Lesia Hausen M.D.   On: 09/08/2022 11:18    ASSESSMENT & PLAN:   72 y.o. male with:  Newly diagnosed Multiple Myeloma Irregular heartbeat cardiomyopathy Hypothyroidism OSA Type 2 DM Hypertension   PLAN:  -Discussed lab results from 10/01/2022 in detail with patient. CBC showed WBC of 6.6K, hemoglobin of 8.1, and platelets of 123K. -Hemoglobin previously 6.1 on 09/28/2022, improved to 8.1 on 10/01/2022 -informed patient of the cause of his recent hospitalized -bone marrow biopsy 09/29/2022 confirmed that a large part of the bone marrow 90%is replaced by plasma cells which produced bone tumors, likely causing bone and hip pain. -Patient's phosphorous levels greater than 30, concerning for metastatic calcification -Patient had renal US on 09/25/2022 -SPEP showed monoclonal protein--2.8/dL M spike -K/L significantly elevated at 184 with K/L ratio of 20.4  -90% of BM involvement by plasma cell neoplasm -calcium levels currently  normalized -hypercalcemia was likely the cause for patient's confusion -discussed results of CT chest which showed destructive soft tissue mass extending from the anterior right 2nd rib and spots in several other areas of the bone -informed patient of function of plasma cells -informed patent that multiple myeloma in elderly over age 14-65 from genetic changes in Bone marrow, in people with chemical exposures -  discussed details of multiple myeloma, bone tumor, anemia, high calcium, and affecting kidneys -disease is not curable but is fairly treatable with targeted therapies -discussed that treatment does involve steroids which may require adjustment of blood sugar medications by PCP, Dr. Everlene Other -informed patient that history of medical issues does have a bearing on treatment -Discussed details of combination of targeted treatments which would involve Daratumumab and Dexamethasone. May add weekly Velcade injections depending on patient's response.  -discussed goal to knock back disease by 90%. -treatment will involve monthly Xgeva bone strengthening medication to strengthen bones -Discussed option of local radiation to shrink tumors to prevent any fractures.  -informed patient that he may require blood transfusions for low blood counts or IV fluids -answered all of patient's and his wife's questions in detail regarding details of disease -will connect patient with social worker for transportation logistics -will set up patient for an education session with chemotherapy nurses to discuss details of treatment -will order Myeloma fish panel to risk stratify -will order urine sample to rule out urinary infection due to burning sensation with urination -advised patient to follow with Nephology regularly for evaluation and management -will order antiviral reduce risk of certain infections -will order anti nausea medication -discussed options of OTC stool softener, or OTC laxatives such as  Mirolax -Will order medication to improve bowel movements -will order blood tests including genetic testing of the bone marrow -will order whole body PET scan for further evaluation  -discussed option of home care services by nurse to see if patient may need physical therapy or other forms of help -Patient reports that the hospital has initiated home care services  FOLLOW-UP: Labs today PET CT scan within a week Chemo counseling for daratumumab Velcade dexamethasone and Xgeva Plan to start daratumumab/dexamethasone and Xgeva soon as possible MD visit with cycle 1 day 1 of treatment   The total time spent in the appointment was 63 minutes* .  All of the patient's questions were answered with apparent satisfaction. The patient knows to call the clinic with any problems, questions or concerns.   Wyvonnia Lora MD MS AAHIVMS [facility] Hematology/Oncology Physician [facility]  .*Total Encounter Time as defined by the Centers for Medicare and Medicaid Services includes, in addition to the face-to-face time of a patient visit (documented in the note above) non-face-to-face time: obtaining and reviewing outside history, ordering and reviewing medications, tests or procedures, care coordination (communications with other health care professionals or caregivers) and documentation in the medical record.    I,Mitra Faeizi,acting as a Neurosurgeon for Wyvonnia Lora, MD.,have documented all relevant documentation on the behalf of Wyvonnia Lora, MD,as directed by  Wyvonnia Lora, MD while in the presence of Wyvonnia Lora, MD.  .I have reviewed the above documentation for accuracy and completeness, and I agree with the above. Johney Maine MD

## 2022-10-06 LAB — KAPPA/LAMBDA LIGHT CHAINS
Kappa free light chain: 222.1 mg/L — ABNORMAL HIGH (ref 3.3–19.4)
Kappa, lambda light chain ratio: 14.24 — ABNORMAL HIGH (ref 0.26–1.65)
Lambda free light chains: 15.6 mg/L (ref 5.7–26.3)

## 2022-10-06 LAB — MISC LABCORP TEST (SEND OUT): Labcorp test code: 144050

## 2022-10-06 LAB — HCV AB W REFLEX TO QUANT PCR: HCV Ab: NONREACTIVE

## 2022-10-06 LAB — HCV INTERPRETATION

## 2022-10-07 LAB — BETA 2 MICROGLOBULIN, SERUM: Beta-2 Microglobulin: 12.5 mg/L — ABNORMAL HIGH (ref 0.6–2.4)

## 2022-10-09 LAB — MISC LABCORP TEST (SEND OUT)
Labcorp test code: 6718
Labcorp test code: 81950

## 2022-10-09 LAB — PRETREATMENT RBC PHENOTYPE

## 2022-10-10 ENCOUNTER — Encounter (HOSPITAL_COMMUNITY): Payer: Self-pay | Admitting: Internal Medicine

## 2022-10-11 ENCOUNTER — Telehealth: Payer: Self-pay | Admitting: Hematology

## 2022-10-11 ENCOUNTER — Encounter: Payer: Self-pay | Admitting: Hematology

## 2022-10-11 DIAGNOSIS — Z7189 Other specified counseling: Secondary | ICD-10-CM | POA: Insufficient documentation

## 2022-10-11 DIAGNOSIS — C9000 Multiple myeloma not having achieved remission: Secondary | ICD-10-CM | POA: Insufficient documentation

## 2022-10-11 NOTE — Progress Notes (Signed)
START ON PATHWAY REGIMEN - Multiple Myeloma and Other Plasma Cell Dyscrasias     Cycles 1 and 2: A cycle is every 28 days:     Lenalidomide      Dexamethasone      Daratumumab and hyaluronidase-fihj    Cycles 3 through 6: A cycle is every 28 days:     Lenalidomide      Dexamethasone      Daratumumab and hyaluronidase-fihj    Cycles 7 and beyond: A cycle is every 28 days:     Lenalidomide      Dexamethasone      Daratumumab and hyaluronidase-fihj   **Always confirm dose/schedule in your pharmacy ordering system**  Patient Characteristics: Multiple Myeloma, Newly Diagnosed, Transplant Ineligible or Refused, Unknown or Awaiting Test Results Disease Classification: Multiple Myeloma Therapeutic Status: Newly Diagnosed R2-ISS Staging: Awaiting Test Results Is Patient Eligible for Transplant<= Transplant Ineligible or Refused Risk Status: Awaiting Test Results Intent of Therapy: Non-Curative / Palliative Intent, Discussed with Patient

## 2022-10-12 ENCOUNTER — Inpatient Hospital Stay: Payer: Medicare PPO

## 2022-10-12 ENCOUNTER — Telehealth: Payer: Self-pay | Admitting: Hematology

## 2022-10-12 ENCOUNTER — Other Ambulatory Visit: Payer: Self-pay

## 2022-10-12 LAB — MULTIPLE MYELOMA PANEL, SERUM
Albumin SerPl Elph-Mcnc: 3.6 g/dL (ref 2.9–4.4)
Albumin/Glob SerPl: 0.6 — ABNORMAL LOW (ref 0.7–1.7)
Alpha 1: 0.5 g/dL — ABNORMAL HIGH (ref 0.0–0.4)
Alpha2 Glob SerPl Elph-Mcnc: 1.2 g/dL — ABNORMAL HIGH (ref 0.4–1.0)
B-Globulin SerPl Elph-Mcnc: 1.3 g/dL (ref 0.7–1.3)
Gamma Glob SerPl Elph-Mcnc: 3.7 g/dL — ABNORMAL HIGH (ref 0.4–1.8)
Globulin, Total: 6.8 g/dL — ABNORMAL HIGH (ref 2.2–3.9)
IgA: 53 mg/dL — ABNORMAL LOW (ref 61–437)
IgG (Immunoglobin G), Serum: 6064 mg/dL — ABNORMAL HIGH (ref 603–1613)
IgM (Immunoglobulin M), Srm: 11 mg/dL — ABNORMAL LOW (ref 15–143)
M Protein SerPl Elph-Mcnc: 3.2 g/dL — ABNORMAL HIGH
Total Protein ELP: 10.4 g/dL — ABNORMAL HIGH (ref 6.0–8.5)

## 2022-10-16 ENCOUNTER — Encounter (HOSPITAL_COMMUNITY): Payer: Self-pay | Admitting: Internal Medicine

## 2022-10-18 ENCOUNTER — Telehealth: Payer: Self-pay

## 2022-10-18 ENCOUNTER — Inpatient Hospital Stay: Payer: Medicare PPO | Attending: Hematology

## 2022-10-18 NOTE — Telephone Encounter (Signed)
LM for Scott Bass to call back to the office at [phone] tomorrow after 0800 to discuss rescheduling his Chemo Education class that he missed at 4 pm today.

## 2022-10-25 LAB — MISC LABCORP TEST (SEND OUT): Labcorp test code: 6510

## 2022-10-26 ENCOUNTER — Encounter (HOSPITAL_COMMUNITY)
Admission: RE | Admit: 2022-10-26 | Discharge: 2022-10-26 | Disposition: A | Discharge status: Home / Self Care | Payer: Medicare PPO | Source: Ambulatory Visit | Attending: Hematology | Admitting: Hematology

## 2022-10-26 ENCOUNTER — Encounter (HOSPITAL_COMMUNITY): Payer: Self-pay

## 2022-10-26 DIAGNOSIS — C9000 Multiple myeloma not having achieved remission: Secondary | ICD-10-CM | POA: Insufficient documentation

## 2022-10-26 MED ORDER — FLUDEOXYGLUCOSE F - 18 (FDG) INJECTION: 10.0000 | Freq: Once | INTRAVENOUS | Status: DC | PRN

## 2022-11-24 ENCOUNTER — Ambulatory Visit (HOSPITAL_COMMUNITY): Admission: RE | Admit: 2022-11-24 | Payer: Medicare PPO | Source: Ambulatory Visit

## 2022-11-27 ENCOUNTER — Other Ambulatory Visit: Payer: Self-pay

## 2022-11-28 ENCOUNTER — Ambulatory Visit: Payer: HMO | Admitting: Gastroenterology

## 2022-12-04 ENCOUNTER — Emergency Department (HOSPITAL_COMMUNITY): Payer: Medicare PPO

## 2022-12-04 ENCOUNTER — Encounter (HOSPITAL_COMMUNITY): Payer: Self-pay | Admitting: Radiology

## 2022-12-04 ENCOUNTER — Inpatient Hospital Stay (HOSPITAL_COMMUNITY)
Admission: EM | Admit: 2022-12-04 | Discharge: 2022-12-29 | Disposition: A | Discharge status: Skilled Nursing Facility | Payer: Medicare PPO | Source: Home / Self Care | Attending: Family Medicine | Admitting: Family Medicine

## 2022-12-04 DIAGNOSIS — Z66 Do not resuscitate: Secondary | ICD-10-CM | POA: Clinically undetermined

## 2022-12-04 DIAGNOSIS — E870 Hyperosmolality and hypernatremia: Secondary | ICD-10-CM | POA: Diagnosis not present

## 2022-12-04 DIAGNOSIS — Z515 Encounter for palliative care: Secondary | ICD-10-CM | POA: Diagnosis not present

## 2022-12-04 DIAGNOSIS — C9000 Multiple myeloma not having achieved remission: Secondary | ICD-10-CM | POA: Diagnosis present

## 2022-12-04 DIAGNOSIS — M4804 Spinal stenosis, thoracic region: Secondary | ICD-10-CM | POA: Diagnosis present

## 2022-12-04 DIAGNOSIS — F05 Delirium due to known physiological condition: Secondary | ICD-10-CM | POA: Diagnosis not present

## 2022-12-04 DIAGNOSIS — N184 Chronic kidney disease, stage 4 (severe): Secondary | ICD-10-CM | POA: Diagnosis present

## 2022-12-04 DIAGNOSIS — D631 Anemia in chronic kidney disease: Secondary | ICD-10-CM | POA: Diagnosis present

## 2022-12-04 DIAGNOSIS — N179 Acute kidney failure, unspecified: Secondary | ICD-10-CM | POA: Diagnosis present

## 2022-12-04 DIAGNOSIS — R1310 Dysphagia, unspecified: Secondary | ICD-10-CM | POA: Diagnosis not present

## 2022-12-04 DIAGNOSIS — H548 Legal blindness, as defined in USA: Secondary | ICD-10-CM | POA: Diagnosis present

## 2022-12-04 DIAGNOSIS — Z79890 Hormone replacement therapy (postmenopausal): Secondary | ICD-10-CM

## 2022-12-04 DIAGNOSIS — E039 Hypothyroidism, unspecified: Secondary | ICD-10-CM | POA: Diagnosis present

## 2022-12-04 DIAGNOSIS — Y92009 Unspecified place in unspecified non-institutional (private) residence as the place of occurrence of the external cause: Secondary | ICD-10-CM

## 2022-12-04 DIAGNOSIS — F0390 Unspecified dementia without behavioral disturbance: Secondary | ICD-10-CM | POA: Diagnosis present

## 2022-12-04 DIAGNOSIS — Z7189 Other specified counseling: Secondary | ICD-10-CM

## 2022-12-04 DIAGNOSIS — Z79899 Other long term (current) drug therapy: Secondary | ICD-10-CM

## 2022-12-04 DIAGNOSIS — R000 Tachycardia, unspecified: Secondary | ICD-10-CM | POA: Diagnosis present

## 2022-12-04 DIAGNOSIS — E860 Dehydration: Secondary | ICD-10-CM | POA: Diagnosis present

## 2022-12-04 DIAGNOSIS — E8721 Acute metabolic acidosis: Secondary | ICD-10-CM | POA: Diagnosis present

## 2022-12-04 DIAGNOSIS — Z993 Dependence on wheelchair: Secondary | ICD-10-CM

## 2022-12-04 DIAGNOSIS — B370 Candidal stomatitis: Secondary | ICD-10-CM | POA: Diagnosis not present

## 2022-12-04 DIAGNOSIS — E872 Acidosis, unspecified: Principal | ICD-10-CM | POA: Diagnosis present

## 2022-12-04 DIAGNOSIS — Z803 Family history of malignant neoplasm of breast: Secondary | ICD-10-CM

## 2022-12-04 DIAGNOSIS — M8458XA Pathological fracture in neoplastic disease, other specified site, initial encounter for fracture: Secondary | ICD-10-CM | POA: Diagnosis present

## 2022-12-04 DIAGNOSIS — M4802 Spinal stenosis, cervical region: Secondary | ICD-10-CM | POA: Diagnosis present

## 2022-12-04 DIAGNOSIS — E1122 Type 2 diabetes mellitus with diabetic chronic kidney disease: Secondary | ICD-10-CM | POA: Diagnosis present

## 2022-12-04 DIAGNOSIS — N189 Chronic kidney disease, unspecified: Secondary | ICD-10-CM | POA: Diagnosis present

## 2022-12-04 DIAGNOSIS — G4733 Obstructive sleep apnea (adult) (pediatric): Secondary | ICD-10-CM | POA: Diagnosis present

## 2022-12-04 DIAGNOSIS — E7800 Pure hypercholesterolemia, unspecified: Secondary | ICD-10-CM | POA: Diagnosis present

## 2022-12-04 DIAGNOSIS — H409 Unspecified glaucoma: Secondary | ICD-10-CM | POA: Diagnosis present

## 2022-12-04 DIAGNOSIS — G9341 Metabolic encephalopathy: Secondary | ICD-10-CM | POA: Diagnosis present

## 2022-12-04 DIAGNOSIS — E1165 Type 2 diabetes mellitus with hyperglycemia: Secondary | ICD-10-CM | POA: Diagnosis present

## 2022-12-04 DIAGNOSIS — E871 Hypo-osmolality and hyponatremia: Secondary | ICD-10-CM | POA: Diagnosis present

## 2022-12-04 DIAGNOSIS — E8809 Other disorders of plasma-protein metabolism, not elsewhere classified: Secondary | ICD-10-CM | POA: Diagnosis present

## 2022-12-04 DIAGNOSIS — R410 Disorientation, unspecified: Secondary | ICD-10-CM | POA: Diagnosis not present

## 2022-12-04 DIAGNOSIS — K5900 Constipation, unspecified: Secondary | ICD-10-CM | POA: Diagnosis present

## 2022-12-04 DIAGNOSIS — E538 Deficiency of other specified B group vitamins: Secondary | ICD-10-CM | POA: Diagnosis present

## 2022-12-04 DIAGNOSIS — D649 Anemia, unspecified: Secondary | ICD-10-CM | POA: Insufficient documentation

## 2022-12-04 DIAGNOSIS — W19XXXA Unspecified fall, initial encounter: Secondary | ICD-10-CM | POA: Diagnosis present

## 2022-12-04 DIAGNOSIS — N400 Enlarged prostate without lower urinary tract symptoms: Secondary | ICD-10-CM | POA: Diagnosis present

## 2022-12-04 DIAGNOSIS — Z7401 Bed confinement status: Secondary | ICD-10-CM

## 2022-12-04 DIAGNOSIS — R059 Cough, unspecified: Secondary | ICD-10-CM | POA: Diagnosis present

## 2022-12-04 DIAGNOSIS — Z7984 Long term (current) use of oral hypoglycemic drugs: Secondary | ICD-10-CM

## 2022-12-04 DIAGNOSIS — D61818 Other pancytopenia: Secondary | ICD-10-CM | POA: Diagnosis not present

## 2022-12-04 DIAGNOSIS — Z7982 Long term (current) use of aspirin: Secondary | ICD-10-CM

## 2022-12-04 DIAGNOSIS — Z781 Physical restraint status: Secondary | ICD-10-CM

## 2022-12-04 DIAGNOSIS — E440 Moderate protein-calorie malnutrition: Secondary | ICD-10-CM | POA: Diagnosis present

## 2022-12-04 DIAGNOSIS — M1990 Unspecified osteoarthritis, unspecified site: Secondary | ICD-10-CM | POA: Diagnosis present

## 2022-12-04 DIAGNOSIS — D630 Anemia in neoplastic disease: Secondary | ICD-10-CM | POA: Diagnosis present

## 2022-12-04 DIAGNOSIS — Z8249 Family history of ischemic heart disease and other diseases of the circulatory system: Secondary | ICD-10-CM

## 2022-12-04 DIAGNOSIS — E861 Hypovolemia: Secondary | ICD-10-CM | POA: Diagnosis present

## 2022-12-04 DIAGNOSIS — D6189 Other specified aplastic anemias and other bone marrow failure syndromes: Secondary | ICD-10-CM | POA: Diagnosis not present

## 2022-12-04 DIAGNOSIS — N170 Acute kidney failure with tubular necrosis: Secondary | ICD-10-CM | POA: Diagnosis not present

## 2022-12-04 DIAGNOSIS — R4589 Other symptoms and signs involving emotional state: Secondary | ICD-10-CM | POA: Diagnosis present

## 2022-12-04 DIAGNOSIS — E876 Hypokalemia: Secondary | ICD-10-CM | POA: Diagnosis not present

## 2022-12-04 DIAGNOSIS — Z5941 Food insecurity: Secondary | ICD-10-CM

## 2022-12-04 DIAGNOSIS — I129 Hypertensive chronic kidney disease with stage 1 through stage 4 chronic kidney disease, or unspecified chronic kidney disease: Secondary | ICD-10-CM | POA: Diagnosis present

## 2022-12-04 DIAGNOSIS — Z888 Allergy status to other drugs, medicaments and biological substances status: Secondary | ICD-10-CM

## 2022-12-04 DIAGNOSIS — T380X5A Adverse effect of glucocorticoids and synthetic analogues, initial encounter: Secondary | ICD-10-CM | POA: Diagnosis not present

## 2022-12-04 DIAGNOSIS — E875 Hyperkalemia: Secondary | ICD-10-CM | POA: Diagnosis not present

## 2022-12-04 DIAGNOSIS — Z751 Person awaiting admission to adequate facility elsewhere: Secondary | ICD-10-CM

## 2022-12-04 DIAGNOSIS — Z608 Other problems related to social environment: Secondary | ICD-10-CM | POA: Diagnosis present

## 2022-12-04 DIAGNOSIS — Z833 Family history of diabetes mellitus: Secondary | ICD-10-CM

## 2022-12-04 DIAGNOSIS — Z6823 Body mass index (BMI) 23.0-23.9, adult: Secondary | ICD-10-CM

## 2022-12-04 DIAGNOSIS — K219 Gastro-esophageal reflux disease without esophagitis: Secondary | ICD-10-CM | POA: Diagnosis present

## 2022-12-04 DIAGNOSIS — Z1152 Encounter for screening for COVID-19: Secondary | ICD-10-CM

## 2022-12-04 LAB — CBG MONITORING, ED: Glucose-Capillary: 167 mg/dL — ABNORMAL HIGH (ref 70–99)

## 2022-12-04 LAB — CBC WITH DIFFERENTIAL/PLATELET
Abs Immature Granulocytes: 0.11 10*3/uL — ABNORMAL HIGH (ref 0.00–0.07)
Abs Immature Granulocytes: 0.27 10*3/uL — ABNORMAL HIGH (ref 0.00–0.07)
Basophils Absolute: 0 10*3/uL (ref 0.0–0.1)
Basophils Absolute: 0 10*3/uL (ref 0.0–0.1)
Basophils Relative: 0 %
Basophils Relative: 0 %
Eosinophils Absolute: 0 10*3/uL (ref 0.0–0.5)
Eosinophils Absolute: 0.1 10*3/uL (ref 0.0–0.5)
Eosinophils Relative: 1 %
Eosinophils Relative: 1 %
HCT: 11.8 % — ABNORMAL LOW (ref 39.0–52.0)
HCT: 11.7 % — ABNORMAL LOW (ref 39.0–52.0)
Hemoglobin: 3.5 g/dL — CL (ref 13.0–17.0)
Hemoglobin: 3.5 g/dL — CL (ref 13.0–17.0)
Immature Granulocytes: 3 %
Immature Granulocytes: 5 %
Lymphocytes Relative: 24 %
Lymphocytes Relative: 27 %
Lymphs Abs: 1.1 10*3/uL (ref 0.7–4.0)
Lymphs Abs: 1.5 10*3/uL (ref 0.7–4.0)
MCH: 29.4 pg (ref 26.0–34.0)
MCH: 28.9 pg (ref 26.0–34.0)
MCHC: 29.7 g/dL — ABNORMAL LOW (ref 30.0–36.0)
MCHC: 29.9 g/dL — ABNORMAL LOW (ref 30.0–36.0)
MCV: 99.2 fL (ref 80.0–100.0)
MCV: 96.7 fL (ref 80.0–100.0)
Monocytes Absolute: 0.3 10*3/uL (ref 0.1–1.0)
Monocytes Absolute: 0.7 10*3/uL (ref 0.1–1.0)
Monocytes Relative: 8 %
Monocytes Relative: 12 %
Neutro Abs: 2.9 10*3/uL (ref 1.7–7.7)
Neutro Abs: 3.1 10*3/uL (ref 1.7–7.7)
Neutrophils Relative %: 64 %
Neutrophils Relative %: 55 %
Platelets: 131 10*3/uL — ABNORMAL LOW (ref 150–400)
Platelets: 137 10*3/uL — ABNORMAL LOW (ref 150–400)
RBC: 1.19 MIL/uL — ABNORMAL LOW (ref 4.22–5.81)
RBC: 1.21 MIL/uL — ABNORMAL LOW (ref 4.22–5.81)
RDW: 22.3 % — ABNORMAL HIGH (ref 11.5–15.5)
RDW: 21.7 % — ABNORMAL HIGH (ref 11.5–15.5)
WBC: 4.4 10*3/uL (ref 4.0–10.5)
WBC: 5.7 10*3/uL (ref 4.0–10.5)
nRBC: 1.8 % — ABNORMAL HIGH (ref 0.0–0.2)
nRBC: 1.6 % — ABNORMAL HIGH (ref 0.0–0.2)

## 2022-12-04 LAB — COMPREHENSIVE METABOLIC PANEL
ALT: 28 U/L (ref 0–44)
AST: 35 U/L (ref 15–41)
Albumin: 2.6 g/dL — ABNORMAL LOW (ref 3.5–5.0)
Alkaline Phosphatase: 62 U/L (ref 38–126)
BUN: 139 mg/dL — ABNORMAL HIGH (ref 8–23)
CO2: <7 mmol/L — ABNORMAL LOW (ref 22–32)
Calcium: 9.5 mg/dL (ref 8.9–10.3)
Chloride: 104 mmol/L (ref 98–111)
Creatinine, Ser: 11.76 mg/dL — ABNORMAL HIGH (ref 0.61–1.24)
GFR, Estimated: 4 mL/min — ABNORMAL LOW (ref >60)
Glucose, Bld: 243 mg/dL — ABNORMAL HIGH (ref 70–99)
Potassium: 6.5 mmol/L (ref 3.5–5.1)
Sodium: 128 mmol/L — ABNORMAL LOW (ref 135–145)
Total Bilirubin: 1 mg/dL (ref 0.3–1.2)
Total Protein: >12 g/dL — ABNORMAL HIGH (ref 6.5–8.1)

## 2022-12-04 LAB — CULTURE, BLOOD (ROUTINE X 2)
Culture: NO GROWTH
Special Requests: ADEQUATE

## 2022-12-04 LAB — URINALYSIS, W/ REFLEX TO CULTURE (INFECTION SUSPECTED)
Bilirubin Urine: NEGATIVE
Glucose, UA: NEGATIVE mg/dL
Hgb urine dipstick: NEGATIVE
Ketones, ur: NEGATIVE mg/dL
Leukocytes,Ua: NEGATIVE
Nitrite: NEGATIVE
Protein, ur: 30 mg/dL — AB
Specific Gravity, Urine: 1.012 (ref 1.005–1.030)
pH: 5 (ref 5.0–8.0)

## 2022-12-04 LAB — RESP PANEL BY RT-PCR (RSV, FLU A&B, COVID)  RVPGX2
Influenza A by PCR: NEGATIVE
Influenza B by PCR: NEGATIVE
Resp Syncytial Virus by PCR: NEGATIVE
SARS Coronavirus 2 by RT PCR: NEGATIVE

## 2022-12-04 LAB — TYPE AND SCREEN
ABO/RH(D): B POS
Antibody Screen: NEGATIVE
Unit division: 0
Unit division: 0
Unit division: 0

## 2022-12-04 LAB — BPAM RBC
Blood Product Expiration Date: 202408222359
Blood Product Expiration Date: 202408312359
Blood Product Expiration Date: 202408312359
ISSUE DATE / TIME: 202407290949
ISSUE DATE / TIME: 202407291218
ISSUE DATE / TIME: 202407292008
Unit Type and Rh: 7300
Unit Type and Rh: 7300
Unit Type and Rh: 7300

## 2022-12-04 LAB — BASIC METABOLIC PANEL
Anion gap: 12 (ref 5–15)
Anion gap: 14 (ref 5–15)
BUN: 121 mg/dL — ABNORMAL HIGH (ref 8–23)
BUN: 116 mg/dL — ABNORMAL HIGH (ref 8–23)
CO2: 15 mmol/L — ABNORMAL LOW (ref 22–32)
CO2: 16 mmol/L — ABNORMAL LOW (ref 22–32)
Calcium: 9 mg/dL (ref 8.9–10.3)
Calcium: 8.6 mg/dL — ABNORMAL LOW (ref 8.9–10.3)
Chloride: 104 mmol/L (ref 98–111)
Chloride: 101 mmol/L (ref 98–111)
Creatinine, Ser: 11.06 mg/dL — ABNORMAL HIGH (ref 0.61–1.24)
Creatinine, Ser: 10.4 mg/dL — ABNORMAL HIGH (ref 0.61–1.24)
GFR, Estimated: 4 mL/min — ABNORMAL LOW (ref >60)
GFR, Estimated: 5 mL/min — ABNORMAL LOW (ref >60)
Glucose, Bld: 168 mg/dL — ABNORMAL HIGH (ref 70–99)
Glucose, Bld: 197 mg/dL — ABNORMAL HIGH (ref 70–99)
Potassium: 4.7 mmol/L (ref 3.5–5.1)
Potassium: 4.2 mmol/L (ref 3.5–5.1)
Sodium: 131 mmol/L — ABNORMAL LOW (ref 135–145)
Sodium: 131 mmol/L — ABNORMAL LOW (ref 135–145)

## 2022-12-04 LAB — BLOOD GAS, VENOUS
Acid-base deficit: 17.5 mmol/L — ABNORMAL HIGH (ref 0.0–2.0)
Bicarbonate: 8.4 mmol/L — ABNORMAL LOW (ref 20.0–28.0)
O2 Saturation: 78.6 %
Patient temperature: 37
pCO2, Ven: 21 mmHg — ABNORMAL LOW (ref 44–60)
pH, Ven: 7.21 — ABNORMAL LOW (ref 7.25–7.43)
pO2, Ven: 51 mmHg — ABNORMAL HIGH (ref 32–45)

## 2022-12-04 LAB — I-STAT CG4 LACTIC ACID, ED
Lactic Acid, Venous: 6 mmol/L (ref 0.5–1.9)
Lactic Acid, Venous: 2.7 mmol/L (ref 0.5–1.9)

## 2022-12-04 LAB — PREPARE RBC (CROSSMATCH)

## 2022-12-04 LAB — TROPONIN I (HIGH SENSITIVITY)
Troponin I (High Sensitivity): 40 ng/L — ABNORMAL HIGH (ref <18)
Troponin I (High Sensitivity): 47 ng/L — ABNORMAL HIGH (ref <18)

## 2022-12-04 LAB — GLUCOSE, CAPILLARY
Glucose-Capillary: 104 mg/dL — ABNORMAL HIGH (ref 70–99)
Glucose-Capillary: 169 mg/dL — ABNORMAL HIGH (ref 70–99)
Glucose-Capillary: 148 mg/dL — ABNORMAL HIGH (ref 70–99)

## 2022-12-04 LAB — HEMOGLOBIN AND HEMATOCRIT, BLOOD
HCT: 19.4 % — ABNORMAL LOW (ref 39.0–52.0)
Hemoglobin: 6.2 g/dL — CL (ref 13.0–17.0)

## 2022-12-04 LAB — MRSA NEXT GEN BY PCR, NASAL: MRSA by PCR Next Gen: NOT DETECTED

## 2022-12-04 LAB — CK: Total CK: 66 U/L (ref 49–397)

## 2022-12-04 LAB — BETA-HYDROXYBUTYRIC ACID: Beta-Hydroxybutyric Acid: 1.39 mmol/L — ABNORMAL HIGH (ref 0.05–0.27)

## 2022-12-04 LAB — PROTIME-INR
INR: 1.5 — ABNORMAL HIGH (ref 0.8–1.2)
Prothrombin Time: 18.1 seconds — ABNORMAL HIGH (ref 11.4–15.2)

## 2022-12-04 LAB — APTT: aPTT: 23 seconds — ABNORMAL LOW (ref 24–36)

## 2022-12-04 LAB — PROCALCITONIN: Procalcitonin: 2.17 ng/mL

## 2022-12-04 MED ORDER — INSULIN ASPART 100 UNIT/ML IV SOLN
5.0000 [IU] | Freq: Once | INTRAVENOUS | Status: AC
  Administered 2022-12-04: 5 [IU] via INTRAVENOUS
  Filled 2022-12-04: qty 0.05

## 2022-12-04 MED ORDER — FERROUS SULFATE 325 (65 FE) MG PO TABS
325.0000 mg | ORAL_TABLET | ORAL | Status: DC
  Administered 2022-12-08 – 2022-12-28 (×10): 325 mg via ORAL
  Filled 2022-12-04 (×13): qty 1

## 2022-12-04 MED ORDER — SODIUM BICARBONATE 8.4 % IV SOLN
Freq: Once | INTRAVENOUS | Status: AC
  Filled 2022-12-04: qty 150

## 2022-12-04 MED ORDER — BRIMONIDINE TARTRATE 0.2 % OP SOLN
1.0000 [drp] | Freq: Three times a day (TID) | OPHTHALMIC | Status: DC
  Administered 2022-12-04 – 2022-12-29 (×69): 1 [drp] via OPHTHALMIC
  Filled 2022-12-04 (×2): qty 5

## 2022-12-04 MED ORDER — ALUM HYDROXIDE-MAG TRISILICATE 80-20 MG PO CHEW: 2.0000 | CHEWABLE_TABLET | Freq: Three times a day (TID) | ORAL | Status: DC | PRN

## 2022-12-04 MED ORDER — CHLORHEXIDINE GLUCONATE CLOTH 2 % EX PADS
6.0000 | MEDICATED_PAD | Freq: Every day | CUTANEOUS | Status: DC
  Administered 2022-12-04 – 2022-12-05 (×2): 6 via TOPICAL

## 2022-12-04 MED ORDER — HALOPERIDOL LACTATE 5 MG/ML IJ SOLN
2.0000 mg | Freq: Once | INTRAMUSCULAR | Status: AC
  Administered 2022-12-04: 2 mg via INTRAVENOUS
  Filled 2022-12-04: qty 1

## 2022-12-04 MED ORDER — LEVOTHYROXINE SODIUM 150 MCG PO TABS
150.0000 ug | ORAL_TABLET | Freq: Every day | ORAL | Status: DC
  Administered 2022-12-05 – 2022-12-29 (×21): 150 ug via ORAL
  Filled 2022-12-04: qty 1
  Filled 2022-12-04: qty 2
  Filled 2022-12-04: qty 1
  Filled 2022-12-04 (×2): qty 2
  Filled 2022-12-04: qty 1
  Filled 2022-12-04: qty 2
  Filled 2022-12-04 (×2): qty 1
  Filled 2022-12-04: qty 2
  Filled 2022-12-04 (×2): qty 1
  Filled 2022-12-04: qty 2
  Filled 2022-12-04 (×3): qty 1
  Filled 2022-12-04 (×2): qty 2
  Filled 2022-12-04 (×6): qty 1

## 2022-12-04 MED ORDER — TAMSULOSIN HCL 0.4 MG PO CAPS
0.4000 mg | ORAL_CAPSULE | Freq: Every day | ORAL | Status: DC
  Administered 2022-12-05 – 2022-12-29 (×22): 0.4 mg via ORAL
  Filled 2022-12-04 (×24): qty 1

## 2022-12-04 MED ORDER — SODIUM CHLORIDE 0.9 % IV SOLN
2.0000 g | Freq: Once | INTRAVENOUS | Status: AC
  Administered 2022-12-04: 2 g via INTRAVENOUS
  Filled 2022-12-04: qty 12.5

## 2022-12-04 MED ORDER — LACTATED RINGERS IV BOLUS
1000.0000 mL | Freq: Once | INTRAVENOUS | Status: AC
  Administered 2022-12-04: 1000 mL via INTRAVENOUS

## 2022-12-04 MED ORDER — LATANOPROST 0.005 % OP SOLN
1.0000 [drp] | Freq: Every day | OPHTHALMIC | Status: DC
  Administered 2022-12-04 – 2022-12-28 (×23): 1 [drp] via OPHTHALMIC
  Filled 2022-12-04 (×2): qty 2.5

## 2022-12-04 MED ORDER — SODIUM BICARBONATE 8.4 % IV SOLN
50.0000 meq | Freq: Once | INTRAVENOUS | Status: AC
  Administered 2022-12-04: 50 meq via INTRAVENOUS
  Filled 2022-12-04: qty 50

## 2022-12-04 MED ORDER — FUROSEMIDE 10 MG/ML IJ SOLN
40.0000 mg | Freq: Once | INTRAMUSCULAR | Status: AC
  Administered 2022-12-04: 40 mg via INTRAVENOUS
  Filled 2022-12-04: qty 4

## 2022-12-04 MED ORDER — SIMETHICONE 40 MG/0.6ML PO SUSP: 40.0000 mg | Freq: Four times a day (QID) | ORAL | Status: DC | PRN

## 2022-12-04 MED ORDER — CALCIUM GLUCONATE-NACL 1-0.675 GM/50ML-% IV SOLN
1.0000 g | Freq: Once | INTRAVENOUS | Status: AC
  Administered 2022-12-04: 1000 mg via INTRAVENOUS
  Filled 2022-12-04: qty 50

## 2022-12-04 MED ORDER — BOOST / RESOURCE BREEZE PO LIQD CUSTOM
1.0000 | Freq: Three times a day (TID) | ORAL | Status: DC
  Administered 2022-12-05 – 2022-12-07 (×2): 1 via ORAL

## 2022-12-04 MED ORDER — SODIUM CHLORIDE 0.9 % IV SOLN
2.0000 g | INTRAVENOUS | Status: AC
  Administered 2022-12-05 – 2022-12-08 (×4): 2 g via INTRAVENOUS
  Filled 2022-12-04 (×4): qty 20

## 2022-12-04 MED ORDER — POLYETHYLENE GLYCOL 3350 17 G PO PACK
17.0000 g | PACK | Freq: Every day | ORAL | Status: DC
  Administered 2022-12-07 – 2022-12-18 (×10): 17 g via ORAL
  Filled 2022-12-04 (×13): qty 1

## 2022-12-04 MED ORDER — INSULIN ASPART 100 UNIT/ML IJ SOLN
0.0000 [IU] | INTRAMUSCULAR | Status: DC
  Administered 2022-12-04: 2 [IU] via SUBCUTANEOUS
  Administered 2022-12-05: 3 [IU] via SUBCUTANEOUS
  Administered 2022-12-05 (×2): 2 [IU] via SUBCUTANEOUS
  Administered 2022-12-05: 3 [IU] via SUBCUTANEOUS
  Administered 2022-12-05: 2 [IU] via SUBCUTANEOUS
  Administered 2022-12-06: 3 [IU] via SUBCUTANEOUS
  Administered 2022-12-06: 2 [IU] via SUBCUTANEOUS
  Administered 2022-12-06 (×2): 3 [IU] via SUBCUTANEOUS
  Administered 2022-12-06: 2 [IU] via SUBCUTANEOUS
  Administered 2022-12-06 – 2022-12-07 (×2): 3 [IU] via SUBCUTANEOUS
  Administered 2022-12-07 (×4): 2 [IU] via SUBCUTANEOUS
  Administered 2022-12-07 – 2022-12-08 (×3): 3 [IU] via SUBCUTANEOUS
  Administered 2022-12-08: 5 [IU] via SUBCUTANEOUS
  Administered 2022-12-08 – 2022-12-09 (×5): 3 [IU] via SUBCUTANEOUS
  Administered 2022-12-09 (×2): 2 [IU] via SUBCUTANEOUS
  Administered 2022-12-09 (×2): 3 [IU] via SUBCUTANEOUS
  Administered 2022-12-10: 2 [IU] via SUBCUTANEOUS
  Administered 2022-12-10 (×2): 3 [IU] via SUBCUTANEOUS
  Administered 2022-12-10 – 2022-12-11 (×3): 2 [IU] via SUBCUTANEOUS
  Administered 2022-12-11: 5 [IU] via SUBCUTANEOUS
  Administered 2022-12-11 – 2022-12-12 (×2): 2 [IU] via SUBCUTANEOUS
  Administered 2022-12-12: 3 [IU] via SUBCUTANEOUS
  Administered 2022-12-13 (×4): 2 [IU] via SUBCUTANEOUS
  Administered 2022-12-14: 5 [IU] via SUBCUTANEOUS
  Administered 2022-12-14: 2 [IU] via SUBCUTANEOUS
  Administered 2022-12-15 (×2): 5 [IU] via SUBCUTANEOUS
  Administered 2022-12-15: 3 [IU] via SUBCUTANEOUS
  Administered 2022-12-15: 2 [IU] via SUBCUTANEOUS
  Administered 2022-12-15 – 2022-12-16 (×3): 3 [IU] via SUBCUTANEOUS
  Administered 2022-12-16: 5 [IU] via SUBCUTANEOUS
  Administered 2022-12-16: 3 [IU] via SUBCUTANEOUS
  Administered 2022-12-16: 5 [IU] via SUBCUTANEOUS
  Administered 2022-12-16: 3 [IU] via SUBCUTANEOUS
  Administered 2022-12-16: 5 [IU] via SUBCUTANEOUS
  Administered 2022-12-17 (×3): 3 [IU] via SUBCUTANEOUS
  Administered 2022-12-17 (×2): 2 [IU] via SUBCUTANEOUS
  Administered 2022-12-18 (×2): 3 [IU] via SUBCUTANEOUS
  Administered 2022-12-18: 5 [IU] via SUBCUTANEOUS
  Administered 2022-12-18 – 2022-12-20 (×6): 3 [IU] via SUBCUTANEOUS
  Administered 2022-12-20: 5 [IU] via SUBCUTANEOUS
  Administered 2022-12-20: 3 [IU] via SUBCUTANEOUS
  Administered 2022-12-21: 2 [IU] via SUBCUTANEOUS
  Administered 2022-12-21: 8 [IU] via SUBCUTANEOUS
  Administered 2022-12-21 (×2): 3 [IU] via SUBCUTANEOUS
  Administered 2022-12-22: 5 [IU] via SUBCUTANEOUS
  Administered 2022-12-22: 3 [IU] via SUBCUTANEOUS
  Administered 2022-12-22: 2 [IU] via SUBCUTANEOUS
  Administered 2022-12-23: 3 [IU] via SUBCUTANEOUS
  Administered 2022-12-23: 2 [IU] via SUBCUTANEOUS
  Administered 2022-12-23: 5 [IU] via SUBCUTANEOUS
  Administered 2022-12-24 – 2022-12-25 (×3): 2 [IU] via SUBCUTANEOUS

## 2022-12-04 MED ORDER — ONDANSETRON HCL 4 MG PO TABS
8.0000 mg | ORAL_TABLET | Freq: Three times a day (TID) | ORAL | Status: DC | PRN
  Administered 2022-12-25: 8 mg via ORAL
  Filled 2022-12-04: qty 2

## 2022-12-04 MED ORDER — LEVOTHYROXINE SODIUM 50 MCG PO TABS: 150.0000 ug | ORAL_TABLET | Freq: Every day | ORAL | Status: DC

## 2022-12-04 MED ORDER — INSULIN ASPART 100 UNIT/ML IJ SOLN
0.0000 [IU] | INTRAMUSCULAR | Status: DC
  Administered 2022-12-04: 2 [IU] via SUBCUTANEOUS

## 2022-12-04 MED ORDER — SODIUM ZIRCONIUM CYCLOSILICATE 10 G PO PACK
10.0000 g | PACK | Freq: Once | ORAL | Status: AC
  Administered 2022-12-04: 10 g via ORAL
  Filled 2022-12-04: qty 1

## 2022-12-04 MED ORDER — SENNOSIDES 8.8 MG/5ML PO SYRP
15.0000 mL | ORAL_SOLUTION | Freq: Two times a day (BID) | ORAL | Status: DC
  Administered 2022-12-04 – 2022-12-18 (×15): 15 mL via ORAL
  Filled 2022-12-04 (×32): qty 15

## 2022-12-04 MED ORDER — ONDANSETRON HCL 4 MG/2ML IJ SOLN
4.0000 mg | Freq: Three times a day (TID) | INTRAMUSCULAR | Status: DC | PRN
  Administered 2022-12-16 – 2022-12-17 (×2): 4 mg via INTRAVENOUS
  Filled 2022-12-04 (×3): qty 2

## 2022-12-04 MED ORDER — POLYETHYLENE GLYCOL 3350 17 G PO PACK
17.0000 g | PACK | Freq: Every day | ORAL | Status: DC | PRN
  Administered 2022-12-04 – 2022-12-10 (×2): 17 g via ORAL
  Filled 2022-12-04 (×2): qty 1

## 2022-12-04 MED ORDER — LACTATED RINGERS IV SOLN: INTRAVENOUS | Status: DC

## 2022-12-04 MED ORDER — LACTATED RINGERS IV BOLUS (SEPSIS)
1000.0000 mL | Freq: Once | INTRAVENOUS | Status: AC
  Administered 2022-12-04: 1000 mL via INTRAVENOUS

## 2022-12-04 MED ORDER — PHENOL 1.4 % MT LIQD
1.0000 | OROMUCOSAL | Status: DC | PRN
  Administered 2022-12-04: 1 via OROMUCOSAL
  Filled 2022-12-04: qty 177

## 2022-12-04 MED ORDER — VANCOMYCIN HCL IN DEXTROSE 1-5 GM/200ML-% IV SOLN
1000.0000 mg | Freq: Once | INTRAVENOUS | Status: AC
  Administered 2022-12-04: 1000 mg via INTRAVENOUS
  Filled 2022-12-04: qty 200

## 2022-12-04 MED ORDER — ORAL CARE MOUTH RINSE: 15.0000 mL | OROMUCOSAL | Status: DC | PRN

## 2022-12-04 MED ORDER — METRONIDAZOLE 500 MG/100ML IV SOLN
500.0000 mg | Freq: Once | INTRAVENOUS | Status: AC
  Administered 2022-12-04: 500 mg via INTRAVENOUS
  Filled 2022-12-04: qty 100

## 2022-12-04 MED ORDER — DOCUSATE SODIUM 100 MG PO CAPS
100.0000 mg | ORAL_CAPSULE | Freq: Two times a day (BID) | ORAL | Status: DC | PRN
  Administered 2022-12-04: 100 mg via ORAL
  Filled 2022-12-04 (×3): qty 1

## 2022-12-04 MED ORDER — SODIUM BICARBONATE 8.4 % IV SOLN
INTRAVENOUS | Status: DC
  Filled 2022-12-04 (×2): qty 1000
  Filled 2022-12-04: qty 150
  Filled 2022-12-04: qty 1000
  Filled 2022-12-04: qty 150

## 2022-12-04 MED ORDER — DEXTROSE 50 % IV SOLN
1.0000 | Freq: Once | INTRAVENOUS | Status: AC
  Administered 2022-12-04: 50 mL via INTRAVENOUS
  Filled 2022-12-04: qty 50

## 2022-12-04 MED ORDER — HYDROMORPHONE HCL 1 MG/ML IJ SOLN
0.5000 mg | INTRAMUSCULAR | Status: DC | PRN
  Administered 2022-12-04 – 2022-12-28 (×15): 0.5 mg via INTRAVENOUS
  Filled 2022-12-04 (×2): qty 1
  Filled 2022-12-04 (×2): qty 0.5
  Filled 2022-12-04: qty 1
  Filled 2022-12-04 (×2): qty 0.5
  Filled 2022-12-04 (×4): qty 1
  Filled 2022-12-04 (×2): qty 0.5
  Filled 2022-12-04 (×2): qty 1
  Filled 2022-12-04: qty 0.5

## 2022-12-04 MED ORDER — SODIUM CHLORIDE 0.9% IV SOLUTION: Freq: Once | INTRAVENOUS | Status: AC

## 2022-12-04 MED ORDER — HALOPERIDOL LACTATE 5 MG/ML IJ SOLN
2.0000 mg | INTRAMUSCULAR | Status: DC | PRN
  Administered 2022-12-04 – 2022-12-05 (×5): 2 mg via INTRAVENOUS
  Filled 2022-12-04 (×6): qty 1

## 2022-12-04 MED ORDER — BISACODYL 10 MG RE SUPP
10.0000 mg | Freq: Once | RECTAL | Status: AC
  Administered 2022-12-04: 10 mg via RECTAL
  Filled 2022-12-04: qty 1

## 2022-12-04 NOTE — Progress Notes (Signed)
Nephrology Note:   Consulted by ED MD at [facility] for this patient - 42M with CKD, dementia, HTN, DM recent diagnosis of multiple myeloma who presented to the ED this AM after a fall at home.   Hypothermic, tachycardic with normal BP and normal sats on RA.   Labs showing Na 128, K 6.5, Bicarb <7, BUN 139, Cr 11, Ca 9.5, T pro > 12, Hb 3.5, Initial lacate 5.7 > 2.7.  CT showing innumerable lytic lesions, T4 expansile mass, multiple pathologic rib fractures, enlarged prostate.   Receiving broad spectrum abx, medical management for acidosis and hyperkalemia.    Patient reportedly unable to discuss GOC and wife is not currently reachable.  Going to ICU at this time.   No emergent indications for dialysis at this time and with Hb 3.5 not currently safe to do so anyway.    Need further clarification re: GOC and prognosis with MM - pt was scheduled for PET and chemo asap after 5/30 clinic visit but he didn't have scan, didn't attend chemo education class.    Will do full consultation shortly.  Please reach out to me with any urgent concerns in the meantime.   Estill Bakes MD [facility] Pager [phone]

## 2022-12-04 NOTE — Consult Note (Signed)
[facility]  INPATIENT CONSULTATION  Reason for Consultation: AKI on CKD Requesting Provider: Dr Renaye Rakers  HPI: Scott Bass is an 72 y.o. male with CKD, blindness, WC bound, HTN, DM recent diagnosis of multiple myeloma who presented to the ED this AM after a fall at home.  Nephrology is consulted for evaluation and management of AKI on CKD accompanied by hyperkalemia and acidosis.    Pt presented to ED today after a fall at home.  He was hypothermic, tachycardic with normal BP and normal sats on RA.  Labs showing Na 128, K 6.5, Bicarb <7, BUN 139, Cr 11, Ca 9.5, T pro > 12, Hb 3.5, Initial lacate 5.7 > 2.7.  CT showing innumerable lytic lesions, T4 expansile mass, multiple pathologic rib fractures, enlarged prostate.  No hydronephrosis noted.    Receiving broad spectrum abx, isotonic fluids and medical management for acidosis and hyperkalemia.  Repeat labs showing Na 131, K 4.7, Bicarb 15, BUN 121, Cr 11.06, Ca 9, procalcitonin 2.1 (nl).  2u pRBC are currently being transfused.  Bicarb gtt running.   Pt c/o dry mouth and mild abd pain.  Cannot give details about UOP.  A foley is ordered but not yet in place.  Tried to call wife at number in chart - no answer.  Married 40y.   Says he was at the PET scan for his myeloma but was frustrated the IV wasn't going in so he left without the scan; unable to say what if any other plans for myeloma care exist.   PMH: Past Medical History:  Diagnosis Date   Arthritis    "lower back" (12/18/2017)   Better eye: moderate vision impairment; lesser eye: blind    GERD (gastroesophageal reflux disease)    Glaucoma, both eyes    High cholesterol    Hypertension    Hypothyroidism    Irregular heartbeat    Legally blind    "both eyes; can see some out of left eye"   OSA (obstructive sleep apnea)    Thyroid disease    Type II diabetes mellitus (HCC)    Vitamin B12 deficiency    PSH: Past Surgical History:  Procedure Laterality Date   LEFT  HEART CATH AND CORONARY ANGIOGRAPHY N/A 12/18/2017   Procedure: LEFT HEART CATH AND CORONARY ANGIOGRAPHY;  Surgeon: Elder Negus, MD;  Location: [facility];  Service: Cardiovascular;  Laterality: N/A;   NOSE SURGERY     "was crooked; they straightened it out" (12/18/2017)    Past Medical History:  Diagnosis Date   Arthritis    "lower back" (12/18/2017)   Better eye: moderate vision impairment; lesser eye: blind    GERD (gastroesophageal reflux disease)    Glaucoma, both eyes    High cholesterol    Hypertension    Hypothyroidism    Irregular heartbeat    Legally blind    "both eyes; can see some out of left eye"   OSA (obstructive sleep apnea)    Thyroid disease    Type II diabetes mellitus (HCC)    Vitamin B12 deficiency     Medications:  I have reviewed the patient's current medications.  Medications Prior to Admission  Medication Sig Dispense Refill   amLODipine (NORVASC) 5 MG tablet TAKE 1 TABLET(5 MG) BY MOUTH DAILY (Patient taking differently: Take 5 mg by mouth daily.) 30 tablet 11   aspirin 81 MG tablet Take 81 mg by mouth daily.     brimonidine (ALPHAGAN) 0.2 % ophthalmic  solution Place 1 drop into both eyes 3 (three) times daily.   0   empagliflozin (JARDIANCE) 25 MG TABS tablet Take 1 tablet (25 mg total) by mouth daily before breakfast. 30 tablet 0   ferrous sulfate 325 (65 FE) MG tablet Take 325 mg by mouth every other day.     glimepiride (AMARYL) 1 MG tablet Take 1 mg by mouth every morning.     hydrALAZINE (APRESOLINE) 50 MG tablet TAKE 1 TABLET(50 MG) BY MOUTH THREE TIMES DAILY (Patient taking differently: Take 50 mg by mouth 3 (three) times daily.) 270 tablet 2   latanoprost (XALATAN) 0.005 % ophthalmic solution Place 1 drop into both eyes at bedtime. (Patient not taking: Reported on 09/26/2022)     levothyroxine (SYNTHROID, LEVOTHROID) 150 MCG tablet Take 150 mcg by mouth daily.  0   Magnesium Oxide 400 MG CAPS Take 1 capsule by mouth daily.      metFORMIN (GLUCOPHAGE) 1000 MG tablet Take 500 mg by mouth 2 (two) times daily with a meal.      Metoprolol Tartrate 75 MG TABS Take 75 mg by mouth 2 (two) times daily.      ondansetron (ZOFRAN) 8 MG tablet Take 1 tablet (8 mg total) by mouth every 8 (eight) hours as needed for nausea or vomiting. 20 tablet 0   oxyCODONE (OXY IR/ROXICODONE) 5 MG immediate release tablet Take 2.5 mg by mouth 4 (four) times daily as needed for moderate pain, severe pain or breakthrough pain.     polyethylene glycol (MIRALAX) 17 g packet Take 17 g by mouth daily. 14 each 0   Rosuvastatin Calcium 20 MG CPSP Take 20 mg by mouth daily.      tamsulosin (FLOMAX) 0.4 MG CAPS capsule Take 0.4 mg by mouth daily.      ALLERGIES:   Allergies  Allergen Reactions   Hctz [Hydrochlorothiazide] Other (See Comments)    Pancreatitis     FAM HX: Family History  Problem Relation Age of Onset   Diabetes Mellitus II Mother 68   Diabetes Mellitus II Sister    Breast cancer Sister    Heart failure Sister 69    Social History:   reports that he has never smoked. He has never used smokeless tobacco. He reports current alcohol use. He reports that he does not use drugs.  ROS: 12 system ROS neg except per HPI  Blood pressure (!) 159/64, pulse (!) 109, temperature 97.7 F (36.5 C), temperature source Axillary, resp. rate 13, height 5\' 6"  (1.676 m), weight 69 kg, SpO2 99%. PHYSICAL EXAM: Gen:  awake and alert in no distress  Eyes: wave hand acuity ENT: MM dry Neck: JVP flat CV:  tachycardic without rub Abd:  soft, mildly TTP throughout Lungs: clear on RA GU: no foley Extr:  no edema Neuro: oriented to 1924, [place], self and can give some details about med history Skin: warm and dry   Results for orders placed or performed during the hospital encounter of 12/04/22 (from the past 48 hour(s))  Comprehensive metabolic panel     Status: Abnormal   Collection Time: 12/04/22  5:15 AM  Result Value Ref Range   Sodium 128 (L)  135 - 145 mmol/L    Comment: ELECTROLYTES REPEATED TO VERIFY   Potassium 6.5 (HH) 3.5 - 5.1 mmol/L    Comment: ELECTROLYTES REPEATED TO VERIFY CRITICAL RESULT CALLED TO, READ BACK BY AND VERIFIED WITH PERRY,A. RN AT 4098 12/04/22 MULLINS,T    Chloride 104 98 - 111  mmol/L    Comment: ELECTROLYTES REPEATED TO VERIFY   CO2 <7 (L) 22 - 32 mmol/L    Comment: ELECTROLYTES REPEATED TO VERIFY   Glucose, Bld 243 (H) 70 - 99 mg/dL    Comment: Glucose reference range applies only to samples taken after fasting for at least 8 hours.   BUN 139 (H) 8 - 23 mg/dL    Comment: RESULT CONFIRMED BY MANUAL DILUTION   Creatinine, Ser 11.76 (H) 0.61 - 1.24 mg/dL   Calcium 9.5 8.9 - 13.0 mg/dL   Total Protein >86.5 (H) 6.5 - 8.1 g/dL    Comment: REPEATED TO VERIFY   Albumin 2.6 (L) 3.5 - 5.0 g/dL   AST 35 15 - 41 U/L   ALT 28 0 - 44 U/L    Comment: RESULT CONFIRMED BY MANUAL DILUTION   Alkaline Phosphatase 62 38 - 126 U/L   Total Bilirubin 1.0 0.3 - 1.2 mg/dL   GFR, Estimated 4 (L) >60 mL/min    Comment: (NOTE) Calculated using the CKD-EPI Creatinine Equation (2021)    Anion gap NOT CALCULATED 5 - 15    Comment: ELECTROLYTES REPEATED TO VERIFY Performed at [facility], [address], [place], [place] [address]   Troponin I (High Sensitivity)     Status: Abnormal   Collection Time: 12/04/22  5:15 AM  Result Value Ref Range   Troponin I (High Sensitivity) 40 (H) <18 ng/L    Comment: (NOTE) Elevated high sensitivity troponin I (hsTnI) values and significant  changes across serial measurements may suggest ACS but many other  chronic and acute conditions are known to elevate hsTnI results.  Refer to the "Links" section for chest pain algorithms and additional  guidance. Performed at [facility], [address] [place], [place] [address]   CK     Status: None   Collection Time: 12/04/22  5:15 AM  Result Value Ref Range   Total CK 66 49 - 397 U/L     Comment: Performed at [facility], [address] [place], [place] [address]  Blood gas, venous (at [facility])     Status: Abnormal   Collection Time: 12/04/22  5:15 AM  Result Value Ref Range   pH, Ven 7.21 (L) 7.25 - 7.43   pCO2, Ven 21 (L) 44 - 60 mmHg   pO2, Ven 51 (H) 32 - 45 mmHg   Bicarbonate 8.4 (L) 20.0 - 28.0 mmol/L   Acid-base deficit 17.5 (H) 0.0 - 2.0 mmol/L   O2 Saturation 78.6 %   Patient temperature 37.0     Comment: Performed at [facility], [address] [place], [place] [address]  Protime-INR     Status: Abnormal   Collection Time: 12/04/22  5:15 AM  Result Value Ref Range   Prothrombin Time 18.1 (H) 11.4 - 15.2 seconds   INR 1.5 (H) 0.8 - 1.2    Comment: (NOTE) INR goal varies based on device and disease states. Performed at [facility], [address], [place], [place] [address]   APTT     Status: Abnormal   Collection Time: 12/04/22  5:15 AM  Result Value Ref Range   aPTT 23 (L) 24 - 36 seconds    Comment: Performed at [facility], [address] [place], [place] [address]  I-Stat CG4 Lactic Acid     Status: Abnormal   Collection Time: 12/04/22  5:29 AM  Result Value Ref Range  Lactic Acid, Venous 6.0 (HH) 0.5 - 1.9 mmol/L   Comment NOTIFIED PHYSICIAN   CBG monitoring, ED     Status: Abnormal   Collection Time: 12/04/22  5:42 AM  Result Value Ref Range   Glucose-Capillary 167 (H) 70 - 99 mg/dL    Comment: Glucose reference range applies only to samples taken after fasting for at least 8 hours.  Type and screen     Status: None (Preliminary result)   Collection Time: 12/04/22  6:30 AM  Result Value Ref Range   ABO/RH(D) B POS    Antibody Screen NEG    Sample Expiration 12/07/2022,2359    Unit Number [id number]    Blood Component Type RED CELLS,LR    Unit division 00    Status of Unit ISSUED    Transfusion Status OK TO TRANSFUSE    Crossmatch Result Compatible     Unit Number [id number]    Blood Component Type RED CELLS,LR    Unit division 00    Status of Unit ISSUED    Transfusion Status OK TO TRANSFUSE    Crossmatch Result      Compatible Performed at [facility], [address], [place], [place] [address]   Prepare RBC     Status: None   Collection Time: 12/04/22  7:19 AM  Result Value Ref Range   Order Confirmation      ORDER PROCESSED BY BLOOD BANK Performed at [facility], [address], [place], [place] [address]   Resp panel by RT-PCR (RSV, Flu A&B, Covid) Anterior Nasal Swab     Status: None   Collection Time: 12/04/22  8:15 AM   Specimen: Anterior Nasal Swab  Result Value Ref Range   SARS Coronavirus 2 by RT PCR NEGATIVE NEGATIVE    Comment: (NOTE) SARS-CoV-2 target nucleic acids are NOT DETECTED.  The SARS-CoV-2 RNA is generally detectable in upper respiratory specimens during the acute phase of infection. The lowest concentration of SARS-CoV-2 viral copies this assay can detect is 138 copies/mL. A negative result does not preclude SARS-Cov-2 infection and should not be used as the sole basis for treatment or other patient management decisions. A negative result may occur with  improper specimen collection/handling, submission of specimen other than nasopharyngeal swab, presence of viral mutation(s) within the areas targeted by this assay, and inadequate number of viral copies(<138 copies/mL). A negative result must be combined with clinical observations, patient history, and epidemiological information. The expected result is Negative.  Fact Sheet for Patients:  BloggerCourse.com  Fact Sheet for Healthcare Providers:  SeriousBroker.it  This test is no t yet approved or cleared by the [place] FDA and  has been authorized for detection and/or diagnosis of SARS-CoV-2 by FDA under an Emergency Use Authorization (EUA). This  EUA will remain  in effect (meaning this test can be used) for the duration of the COVID-19 declaration under Section 564(b)(1) of the Act, 21 U.S.C.section 360bbb-3(b)(1), unless the authorization is terminated  or revoked sooner.       Influenza A by PCR NEGATIVE NEGATIVE   Influenza B by PCR NEGATIVE NEGATIVE    Comment: (NOTE) The Xpert Xpress SARS-CoV-2/FLU/RSV plus assay is intended as an aid in the diagnosis of influenza from Nasopharyngeal swab specimens and should not be used as a sole basis for treatment. Nasal washings and aspirates are unacceptable for Xpert Xpress SARS-CoV-2/FLU/RSV testing.  Fact Sheet for Patients: BloggerCourse.com  Fact Sheet for Healthcare Providers: SeriousBroker.it  This test is  not yet approved or cleared by the [place] and has been authorized for detection and/or diagnosis of SARS-CoV-2 by FDA under an Emergency Use Authorization (EUA). This EUA will remain in effect (meaning this test can be used) for the duration of the COVID-19 declaration under Section 564(b)(1) of the Act, 21 U.S.C. section 360bbb-3(b)(1), unless the authorization is terminated or revoked.     Resp Syncytial Virus by PCR NEGATIVE NEGATIVE    Comment: (NOTE) Fact Sheet for Patients: BloggerCourse.com  Fact Sheet for Healthcare Providers: SeriousBroker.it  This test is not yet approved or cleared by the [place] FDA and has been authorized for detection and/or diagnosis of SARS-CoV-2 by FDA under an Emergency Use Authorization (EUA). This EUA will remain in effect (meaning this test can be used) for the duration of the COVID-19 declaration under Section 564(b)(1) of the Act, 21 U.S.C. section 360bbb-3(b)(1), unless the authorization is terminated or revoked.  Performed at [facility], [address], [place], [place] [address]    Troponin I (High Sensitivity)     Status: Abnormal   Collection Time: 12/04/22  8:15 AM  Result Value Ref Range   Troponin I (High Sensitivity) 47 (H) <18 ng/L    Comment: (NOTE) Elevated high sensitivity troponin I (hsTnI) values and significant  changes across serial measurements may suggest ACS but many other  chronic and acute conditions are known to elevate hsTnI results.  Refer to the "Links" section for chest pain algorithms and additional  guidance. Performed at [facility], [address], [place], [place] [address]   I-Stat Lactic Acid, ED     Status: Abnormal   Collection Time: 12/04/22  8:19 AM  Result Value Ref Range   Lactic Acid, Venous 2.7 (HH) 0.5 - 1.9 mmol/L   Comment NOTIFIED PHYSICIAN   CBC with Differential     Status: Abnormal   Collection Time: 12/04/22  8:27 AM  Result Value Ref Range   WBC 4.4 4.0 - 10.5 K/uL   RBC 1.19 (L) 4.22 - 5.81 MIL/uL   Hemoglobin 3.5 (LL) 13.0 - 17.0 g/dL    Comment: This critical result has verified and been called to [facility]. RN by Patrina Levering on 07 29 2024 at 0913, and has been read back. RESULTS CONFIRMED VIA RECOLLECT. RESULT CHECKED.   HCT 11.8 (L) 39.0 - 52.0 %   MCV 99.2 80.0 - 100.0 fL   MCH 29.4 26.0 - 34.0 pg   MCHC 29.7 (L) 30.0 - 36.0 g/dL   RDW 32.4 (H) 40.1 - 02.7 %   Platelets 131 (L) 150 - 400 K/uL   nRBC 1.8 (H) 0.0 - 0.2 %   Neutrophils Relative % 64 %   Neutro Abs 2.9 1.7 - 7.7 K/uL   Lymphocytes Relative 24 %   Lymphs Abs 1.1 0.7 - 4.0 K/uL   Monocytes Relative 8 %   Monocytes Absolute 0.3 0.1 - 1.0 K/uL   Eosinophils Relative 1 %   Eosinophils Absolute 0.0 0.0 - 0.5 K/uL   Basophils Relative 0 %   Basophils Absolute 0.0 0.0 - 0.1 K/uL   Immature Granulocytes 3 %   Abs Immature Granulocytes 0.11 (H) 0.00 - 0.07 K/uL   Rouleaux PRESENT    Polychromasia PRESENT     Comment: Performed at [facility], [address], [place], [place] [address]  Urinalysis, w/  Reflex to Culture (Infection Suspected) -Urine, Clean Catch     Status: Abnormal  Collection Time: 12/04/22  8:56 AM  Result Value Ref Range   Specimen Source URINE, CLEAN CATCH    Color, Urine YELLOW YELLOW   APPearance HAZY (A) CLEAR   Specific Gravity, Urine 1.012 1.005 - 1.030   pH 5.0 5.0 - 8.0   Glucose, UA NEGATIVE NEGATIVE mg/dL   Hgb urine dipstick NEGATIVE NEGATIVE   Bilirubin Urine NEGATIVE NEGATIVE   Ketones, ur NEGATIVE NEGATIVE mg/dL   Protein, ur 30 (A) NEGATIVE mg/dL   Nitrite NEGATIVE NEGATIVE   Leukocytes,Ua NEGATIVE NEGATIVE   RBC / HPF 0-5 0 - 5 RBC/hpf   WBC, UA 0-5 0 - 5 WBC/hpf    Comment:        Reflex urine culture not performed if WBC <=10, OR if Squamous epithelial cells >5. If Squamous epithelial cells >5 suggest recollection.    Bacteria, UA RARE (A) NONE SEEN   Squamous Epithelial / HPF 0-5 0 - 5 /HPF   Sperm, UA PRESENT     Comment: Performed at [facility], [address], [place], [place] [address]  CBC with Differential/Platelet     Status: Abnormal   Collection Time: 12/04/22  9:18 AM  Result Value Ref Range   WBC 5.7 4.0 - 10.5 K/uL   RBC 1.21 (L) 4.22 - 5.81 MIL/uL   Hemoglobin 3.5 (LL) 13.0 - 17.0 g/dL    Comment: CRITICAL VALUE NOTED.  VALUE IS CONSISTENT WITH PREVIOUSLY REPORTED AND CALLED VALUE. REPEATED TO VERIFY    HCT 11.7 (L) 39.0 - 52.0 %   MCV 96.7 80.0 - 100.0 fL   MCH 28.9 26.0 - 34.0 pg   MCHC 29.9 (L) 30.0 - 36.0 g/dL   RDW 19.1 (H) 47.8 - 29.5 %   Platelets 137 (L) 150 - 400 K/uL   nRBC 1.6 (H) 0.0 - 0.2 %   Neutrophils Relative % 55 %   Neutro Abs 3.1 1.7 - 7.7 K/uL   Lymphocytes Relative 27 %   Lymphs Abs 1.5 0.7 - 4.0 K/uL   Monocytes Relative 12 %   Monocytes Absolute 0.7 0.1 - 1.0 K/uL   Eosinophils Relative 1 %   Eosinophils Absolute 0.1 0.0 - 0.5 K/uL   Basophils Relative 0 %   Basophils Absolute 0.0 0.0 - 0.1 K/uL   WBC Morphology Mild Left Shift (1-5% metas, occ myelo)    Immature  Granulocytes 5 %   Abs Immature Granulocytes 0.27 (H) 0.00 - 0.07 K/uL   Rouleaux PRESENT    Polychromasia PRESENT     Comment: Performed at [facility], [address], [place], [place] [address]  Basic metabolic panel     Status: Abnormal   Collection Time: 12/04/22  9:18 AM  Result Value Ref Range   Sodium 131 (L) 135 - 145 mmol/L   Potassium 4.7 3.5 - 5.1 mmol/L    Comment: DELTA CHECK NOTED   Chloride 104 98 - 111 mmol/L   CO2 15 (L) 22 - 32 mmol/L   Glucose, Bld 168 (H) 70 - 99 mg/dL    Comment: Glucose reference range applies only to samples taken after fasting for at least 8 hours.   BUN 121 (H) 8 - 23 mg/dL    Comment: RESULT CONFIRMED BY MANUAL DILUTION   Creatinine, Ser 11.06 (H) 0.61 - 1.24 mg/dL   Calcium 9.0 8.9 - 86.5 mg/dL   GFR, Estimated 4 (L) >60 mL/min    Comment: (NOTE) Calculated using the CKD-EPI Creatinine Equation (2021)  Anion gap 12 5 - 15    Comment: Performed at [facility], [address], [place], [place] [address]  Procalcitonin     Status: None   Collection Time: 12/04/22  9:18 AM  Result Value Ref Range   Procalcitonin 2.17 ng/mL    Comment:        Interpretation: PCT > 2 ng/mL: Systemic infection (sepsis) is likely, unless other causes are known. (NOTE)       Sepsis PCT Algorithm           Lower Respiratory Tract                                      Infection PCT Algorithm    ----------------------------     ----------------------------         PCT < 0.25 ng/mL                PCT < 0.10 ng/mL          Strongly encourage             Strongly discourage   discontinuation of antibiotics    initiation of antibiotics    ----------------------------     -----------------------------       PCT 0.25 - 0.50 ng/mL            PCT 0.10 - 0.25 ng/mL               OR       >80% decrease in PCT            Discourage initiation of                                            antibiotics      Encourage  discontinuation           of antibiotics    ----------------------------     -----------------------------         PCT >= 0.50 ng/mL              PCT 0.26 - 0.50 ng/mL               AND       <80% decrease in PCT              Encourage initiation of                                             antibiotics       Encourage continuation           of antibiotics    ----------------------------     -----------------------------        PCT >= 0.50 ng/mL                  PCT > 0.50 ng/mL               AND         increase in PCT                  Strongly encourage  initiation of antibiotics    Strongly encourage escalation           of antibiotics                                     -----------------------------                                           PCT <= 0.25 ng/mL                                                 OR                                        > 80% decrease in PCT                                      Discontinue / Do not initiate                                             antibiotics  Performed at [facility], [address] [place], [place] [address]   MRSA Next Gen by PCR, Nasal     Status: None   Collection Time: 12/04/22 10:57 AM   Specimen: Nasal Mucosa; Nasal Swab  Result Value Ref Range   MRSA by PCR Next Gen NOT DETECTED NOT DETECTED    Comment: (NOTE) The GeneXpert MRSA Assay (FDA approved for NASAL specimens only), is one component of a comprehensive MRSA colonization surveillance program. It is not intended to diagnose MRSA infection nor to guide or monitor treatment for MRSA infections. Test performance is not FDA approved in patients less than 52 years old. Performed at [facility], [address], [place], [place] [address]   Glucose, capillary     Status: Abnormal   Collection Time: 12/04/22 12:01 PM  Result Value Ref Range   Glucose-Capillary 104 (H) 70 - 99 mg/dL     Comment: Glucose reference range applies only to samples taken after fasting for at least 8 hours.   Comment 1 Notify RN    Comment 2 Document in Chart     CT Chest Wo Contrast  Result Date: 12/04/2022 CLINICAL DATA:  Blunt chest trauma. EXAM: CT CHEST, ABDOMEN AND PELVIS WITHOUT CONTRAST TECHNIQUE: Multidetector CT imaging of the chest, abdomen and pelvis was performed following the standard protocol without IV contrast. RADIATION DOSE REDUCTION: This exam was performed according to the departmental dose-optimization program which includes automated exposure control, adjustment of the mA and/or kV according to patient size and/or use of iterative reconstruction technique. COMPARISON:  Chest CT dated 09/26/2022. FINDINGS: CT CHEST FINDINGS Cardiovascular: No thoracic aortic aneurysm. No pericardial effusion. Mediastinum/Nodes: No mass or enlarged lymph nodes within the mediastinum. No hemorrhage or edema is seen within the mediastinum. Esophagus is unremarkable. Trachea is unremarkable. Lungs/Pleura: Mild dependent  atelectasis bilaterally. Lungs appear otherwise clear. No pleural effusion or hemothorax. No pneumothorax. Musculoskeletal: Innumerable lytic lesions throughout the thoracic spine and bilateral ribs, corresponding to patient's known multiple myeloma. Associated large expansile lesion within the anterior second RIGHT rib. Multiple associated pathologic rib fractures bilaterally, of uncertain age but likely chronic. Displaced fracture of the lower RIGHT scapula, also likely pathologic related to underlying lytic lesions (multiple myeloma), of uncertain age. No acute appearing fracture or subluxation within the thoracic spine. Large expansile mass involving the T4 vertebral body with soft tissue component extending into the central canal and LEFT neural foramen, with almost certain mass effect on the thoracic cord and exiting nerve root. CT ABDOMEN PELVIS FINDINGS Hepatobiliary: No focal liver  abnormality is seen. Gallbladder is unremarkable. No perihepatic hematoma. No bile duct dilatation is seen. Pancreas: Partially infiltrated with fat but otherwise unremarkable. Spleen: No splenic injury or perisplenic hematoma. Adrenals/Urinary Tract: Adrenal glands appear normal. No adrenal hemorrhage. No renal injury identified. No renal stone or hydronephrosis. No ureteral or bladder calculi are identified. Bladder is unremarkable. Stomach/Bowel: No dilated large or small bowel loops. No evidence of bowel wall inflammation or bowel wall injury. Appendix is normal. Stomach is unremarkable. Vascular/Lymphatic: No abdominal aortic aneurysm. No periaortic hemorrhage. No enlarged lymph nodes are seen in the abdomen or pelvis. Reproductive: Moderate prostate gland enlargement with some associated mass effect on the bladder base. Other: No free fluid or hemorrhage is seen within the abdomen or pelvis. No free intraperitoneal air. Musculoskeletal: Innumerable lytic lesions throughout the lumbar spine and osseous pelvis, corresponding to patient's known multiple myeloma. Questionable mild compression deformity of the L4 vertebral body, likely pathologic, nonacute and related to the underlying lytic changes of patient's multiple myeloma. No acute-appearing osseous abnormality is seen within the abdomen or pelvis. IMPRESSION: 1. Innumerable lytic lesions throughout the thoracic spine, bilateral ribs, lumbar spine and osseous pelvis, corresponding to patient's known multiple myeloma. 2. Large expansile mass involving the T4 vertebral body with soft tissue component extending into the central canal and LEFT neural foramen, with almost certain mass effect on the thoracic cord and exiting nerve root. Consider MRI of the thoracic spine without and with IV contrast to further evaluate. 3. Multiple associated pathologic rib fractures bilaterally, of uncertain age but likely chronic. 4. Displaced fracture of the lower RIGHT  scapula, of uncertain age but also likely pathologic related to underlying lytic lesions (multiple myeloma). 5. Questionable mild compression deformity of the L4 vertebral body, likely pathologic, nonacute and related to the underlying lytic changes of patient's multiple myeloma. 6. No acute findings within the chest, abdomen or pelvis. No evidence of solid organ injury. No hemorrhage or edema within the mediastinum. No pleural effusion or pneumothorax. No evidence of bowel wall injury. 7. Moderate prostate gland enlargement with some associated mass effect on the bladder base. Electronically Signed   By: Bary Richard M.D.   On: 12/04/2022 08:42   CT ABDOMEN PELVIS WO CONTRAST  Result Date: 12/04/2022 CLINICAL DATA:  Blunt chest trauma. EXAM: CT CHEST, ABDOMEN AND PELVIS WITHOUT CONTRAST TECHNIQUE: Multidetector CT imaging of the chest, abdomen and pelvis was performed following the standard protocol without IV contrast. RADIATION DOSE REDUCTION: This exam was performed according to the departmental dose-optimization program which includes automated exposure control, adjustment of the mA and/or kV according to patient size and/or use of iterative reconstruction technique. COMPARISON:  Chest CT dated 09/26/2022. FINDINGS: CT CHEST FINDINGS Cardiovascular: No thoracic aortic aneurysm. No pericardial effusion. Mediastinum/Nodes: No mass  or enlarged lymph nodes within the mediastinum. No hemorrhage or edema is seen within the mediastinum. Esophagus is unremarkable. Trachea is unremarkable. Lungs/Pleura: Mild dependent atelectasis bilaterally. Lungs appear otherwise clear. No pleural effusion or hemothorax. No pneumothorax. Musculoskeletal: Innumerable lytic lesions throughout the thoracic spine and bilateral ribs, corresponding to patient's known multiple myeloma. Associated large expansile lesion within the anterior second RIGHT rib. Multiple associated pathologic rib fractures bilaterally, of uncertain age but  likely chronic. Displaced fracture of the lower RIGHT scapula, also likely pathologic related to underlying lytic lesions (multiple myeloma), of uncertain age. No acute appearing fracture or subluxation within the thoracic spine. Large expansile mass involving the T4 vertebral body with soft tissue component extending into the central canal and LEFT neural foramen, with almost certain mass effect on the thoracic cord and exiting nerve root. CT ABDOMEN PELVIS FINDINGS Hepatobiliary: No focal liver abnormality is seen. Gallbladder is unremarkable. No perihepatic hematoma. No bile duct dilatation is seen. Pancreas: Partially infiltrated with fat but otherwise unremarkable. Spleen: No splenic injury or perisplenic hematoma. Adrenals/Urinary Tract: Adrenal glands appear normal. No adrenal hemorrhage. No renal injury identified. No renal stone or hydronephrosis. No ureteral or bladder calculi are identified. Bladder is unremarkable. Stomach/Bowel: No dilated large or small bowel loops. No evidence of bowel wall inflammation or bowel wall injury. Appendix is normal. Stomach is unremarkable. Vascular/Lymphatic: No abdominal aortic aneurysm. No periaortic hemorrhage. No enlarged lymph nodes are seen in the abdomen or pelvis. Reproductive: Moderate prostate gland enlargement with some associated mass effect on the bladder base. Other: No free fluid or hemorrhage is seen within the abdomen or pelvis. No free intraperitoneal air. Musculoskeletal: Innumerable lytic lesions throughout the lumbar spine and osseous pelvis, corresponding to patient's known multiple myeloma. Questionable mild compression deformity of the L4 vertebral body, likely pathologic, nonacute and related to the underlying lytic changes of patient's multiple myeloma. No acute-appearing osseous abnormality is seen within the abdomen or pelvis. IMPRESSION: 1. Innumerable lytic lesions throughout the thoracic spine, bilateral ribs, lumbar spine and osseous  pelvis, corresponding to patient's known multiple myeloma. 2. Large expansile mass involving the T4 vertebral body with soft tissue component extending into the central canal and LEFT neural foramen, with almost certain mass effect on the thoracic cord and exiting nerve root. Consider MRI of the thoracic spine without and with IV contrast to further evaluate. 3. Multiple associated pathologic rib fractures bilaterally, of uncertain age but likely chronic. 4. Displaced fracture of the lower RIGHT scapula, of uncertain age but also likely pathologic related to underlying lytic lesions (multiple myeloma). 5. Questionable mild compression deformity of the L4 vertebral body, likely pathologic, nonacute and related to the underlying lytic changes of patient's multiple myeloma. 6. No acute findings within the chest, abdomen or pelvis. No evidence of solid organ injury. No hemorrhage or edema within the mediastinum. No pleural effusion or pneumothorax. No evidence of bowel wall injury. 7. Moderate prostate gland enlargement with some associated mass effect on the bladder base. Electronically Signed   By: Bary Richard M.D.   On: 12/04/2022 08:42   CT L-SPINE NO CHARGE  Result Date: 12/04/2022 CLINICAL DATA:  Fall. EXAM: CT LUMBAR SPINE WITHOUT CONTRAST TECHNIQUE: Multidetector CT imaging of the lumbar spine was performed without intravenous contrast administration. Multiplanar CT image reconstructions were also generated. RADIATION DOSE REDUCTION: This exam was performed according to the departmental dose-optimization program which includes automated exposure control, adjustment of the mA and/or kV according to patient size and/or use of iterative reconstruction  technique. COMPARISON:  Plain film of the lumbar spine dated 11/22/2015. FINDINGS: Segmentation: 5 lumbar type vertebrae. Alignment: Stable.  No evidence of acute vertebral body subluxation. Vertebrae: No fracture line or displaced fracture fragment is seen.  Questionable mild compression of the L4 vertebral body which is likely nonacute. Innumerable lytic lesions throughout the lumbar spine, corresponding to patient's known multiple myeloma. The mild compression of the L4 vertebral body is likely related to the underlying lytic changes. Paraspinal and other soft tissues: Visualized immediate paravertebral soft tissues are unremarkable. Disc levels: Disc desiccations at each level of the lumbar spine, with associated disc space narrowings and osteophyte formation, causing moderate to severe central canal stenoses at the L2-3 through L4-5 levels and mild-to-moderate central canal stenosis at L5-S1. Various degrees of neural foramen narrowing at each level of the lumbar spine as well, with possible associated nerve root impingement. IMPRESSION: 1. No acute fracture or subluxation within the lumbar spine. 2. Innumerable lytic lesions throughout the lumbar spine, corresponding to patient's known multiple myeloma. 3. Questionable interval mild compression of the L4 vertebral body which is likely nonacute and related to the underlying lytic lesions (multiple myeloma). 4. Multilevel degenerative disc disease, causing moderate to severe central canal stenoses at the L2-3 through L4-5 levels and mild-to-moderate central canal stenosis at L5-S1, with possible associated nerve root impingement at 1 or more levels. Electronically Signed   By: Bary Richard M.D.   On: 12/04/2022 08:30   CT Head Wo Contrast  Result Date: 12/04/2022 CLINICAL DATA:  Head trauma, moderate-severe; Neck trauma (Age >= 65y) EXAM: CT HEAD WITHOUT CONTRAST CT CERVICAL SPINE WITHOUT CONTRAST TECHNIQUE: Multidetector CT imaging of the head and cervical spine was performed following the standard protocol without intravenous contrast. Multiplanar CT image reconstructions of the cervical spine were also generated. RADIATION DOSE REDUCTION: This exam was performed according to the departmental dose-optimization  program which includes automated exposure control, adjustment of the mA and/or kV according to patient size and/or use of iterative reconstruction technique. COMPARISON:  None Available. FINDINGS: CT HEAD FINDINGS Brain: No hemorrhage. No extra-axial fluid collection. No hydrocephalus. No CT evidence of an acute cortical infarct. Mild chronic microvascular ischemic change. Vascular: No hyperdense vessel or unexpected calcification. Skull: No acute fracture. There are nonspecific scattered lucent lesions throughout the calvarium, largest which is at the vertex (series 7, image 41). Sinuses/Orbits: No middle ear or mastoid effusion. Polypoid mucosal thickening in the right sphenoid left maxillary sinus. Orbits are unremarkable. Other: None. CT CERVICAL SPINE FINDINGS Alignment: Straightening of the normal cervical lordosis. Skull base and vertebrae: There is a mildly displaced fracture through the right C7 transverse process (series 5, image 30). 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 There are lytic lesions throughout the cervical spine involving every cervical vertebral body. Some of the lesions extend through the posterior cortex, for example at the right posterior C4 endplate (series 7, image 35) in the left aspect of T3 (series 3, image 2). Metastatic lesions are also seen in the bilateral scapula. Soft tissues and spinal canal: There is likely severe spinal canal stenosis at the T3 level secondary to epidural extension of tumor (series 9, image 1). Disc levels:  See above Upper chest: Negative. Other: None IMPRESSION: 1. No acute intracranial abnormality. 2. Mildly displaced fracture through the right C7 transverse process. 3. Multiple lytic lesions throughout the cervical spine, calvarium, and bilateral scapula. Findings could be seen in the setting of diffuse metastatic disease of myeloma. Some of the lesions  extend through the posterior cortex, for example at the right posterior C4  endplate and left aspect of T3, with likely severe spinal canal stenosis at the T3 level secondary to epidural extension of tumor. Recommend MRI of the cervical and thoracic spine with and without contrast for further evaluation. Electronically Signed   By: Lorenza Cambridge M.D.   On: 12/04/2022 08:30   CT Cervical Spine Wo Contrast  Result Date: 12/04/2022 CLINICAL DATA:  Head trauma, moderate-severe; Neck trauma (Age >= 65y) EXAM: CT HEAD WITHOUT CONTRAST CT CERVICAL SPINE WITHOUT CONTRAST TECHNIQUE: Multidetector CT imaging of the head and cervical spine was performed following the standard protocol without intravenous contrast. Multiplanar CT image reconstructions of the cervical spine were also generated. RADIATION DOSE REDUCTION: This exam was performed according to the departmental dose-optimization program which includes automated exposure control, adjustment of the mA and/or kV according to patient size and/or use of iterative reconstruction technique. COMPARISON:  None Available. FINDINGS: CT HEAD FINDINGS Brain: No hemorrhage. No extra-axial fluid collection. No hydrocephalus. No CT evidence of an acute cortical infarct. Mild chronic microvascular ischemic change. Vascular: No hyperdense vessel or unexpected calcification. Skull: No acute fracture. There are nonspecific scattered lucent lesions throughout the calvarium, largest which is at the vertex (series 7, image 41). Sinuses/Orbits: No middle ear or mastoid effusion. Polypoid mucosal thickening in the right sphenoid left maxillary sinus. Orbits are unremarkable. Other: None. CT CERVICAL SPINE FINDINGS Alignment: Straightening of the normal cervical lordosis. Skull base and vertebrae: There is a mildly displaced fracture through the right C7 transverse process (series 5, image 30). 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 There are lytic lesions throughout the cervical spine involving every cervical vertebral body. Some of  the lesions extend through the posterior cortex, for example at the right posterior C4 endplate (series 7, image 35) in the left aspect of T3 (series 3, image 2). Metastatic lesions are also seen in the bilateral scapula. Soft tissues and spinal canal: There is likely severe spinal canal stenosis at the T3 level secondary to epidural extension of tumor (series 9, image 1). Disc levels:  See above Upper chest: Negative. Other: None IMPRESSION: 1. No acute intracranial abnormality. 2. Mildly displaced fracture through the right C7 transverse process. 3. Multiple lytic lesions throughout the cervical spine, calvarium, and bilateral scapula. Findings could be seen in the setting of diffuse metastatic disease of myeloma. Some of the lesions extend through the posterior cortex, for example at the right posterior C4 endplate and left aspect of T3, with likely severe spinal canal stenosis at the T3 level secondary to epidural extension of tumor. Recommend MRI of the cervical and thoracic spine with and without contrast for further evaluation. Electronically Signed   By: Lorenza Cambridge M.D.   On: 12/04/2022 08:30   DG Chest Portable 1 View  Result Date: 12/04/2022 CLINICAL DATA:  Status post fall. EXAM: PORTABLE CHEST 1 VIEW COMPARISON:  PET-CT 10/06/2022 FINDINGS: Heart size and mediastinal contours are unremarkable. No pleural fluid or interstitial edema. No airspace consolidation. 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 right upper lobe lung mass is again seen measuring approximately 4.4 cm on today's study. Again seen are extensive, multifocal lytic bone metastases which involve bilateral ribs. Age indeterminate pathologic fractures involving the lateral aspect of the right fourth rib and left posterior sixth rib noted. IMPRESSION: 1. No acute cardiopulmonary disease. 2. Right upper lobe lung mass. 3. Extensive lytic bone metastases. Age indeterminate pathologic fractures involving the lateral aspect of  the right  fourth rib and left posterior sixth rib. Electronically Signed   By: Signa Kell M.D.   On: 12/04/2022 06:06    Assessment/Plan **AKI on CKD: recent baseline Cr ~1.5-1.7mg /dL > 6/23 3.4 > now 76.2.   Suspect that the untreated myeloma is a major driving factor here for his AKI but certainly appearing very dry and receiving volume resuscitation.  Imaging shows no obstruction.  We'll pursue maximum medical therapy at this point but would consider him a very poor candidate for dialysis in light of his untreated malignancy, comorbid, functional status and would recommend against it.  Called wife to further discuss -- she did not answer at the number listed in the chart.   **Hyperkalemia: improved with med mgmt  **hyponatremia: hypovolemic, improving with fluids.   **Metabolic acidosis: secondary to AKI, improving with IV bicarb.   **Anemia:  Hb 3.5, being transfused 2u pRBC currently.   **Myeloma:  dx 09/2022 --> saw Dr. Candise Che and PET, chemo ordered; pt frustrated by IV issue at PET in early 10/2022 and left scan (he reports), hasn't started chemo --> onc has been consulted inpatient as has palliative. Seems like he missed the window for chemo and is too ill now to proceed.   **Possible sepsis:  on broad spectrum abx per primary.   Will follow but not much else to add, reach out if I can further assist.   Tyler Pita 12/04/2022, 12:55 PM

## 2022-12-04 NOTE — Progress Notes (Signed)
Pt being followed by [facility] for Sepsis protocol. 

## 2022-12-04 NOTE — ED Notes (Signed)
Patient transported to CT 

## 2022-12-04 NOTE — Progress Notes (Signed)
IP PROGRESS NOTE  Subjective:   Mr. Lowe was diagnosed with multiple myeloma in May after presenting with severe anemia and renal failure.  He was seen by Dr. Candise Che and was scheduled to begin treatment with daratumumab/Decadron/Velcade on 10/18/2022.  He did not begin treatment.  Mr. Santis presented to the emergency room today after a fall.  He was noted to have renal failure and severe anemia.  He was admitted for further evaluation. Mr. Tolmie reports not eating or drinking for several days.  He complains of abdominal pain.  No fever.  No difficulty with bowel or bladder function.  Objective: Vital signs in last 24 hours: Blood pressure (!) 167/91, pulse (!) 118, temperature (!) 97.5 F (36.4 C), temperature source Axillary, resp. rate 17, height 5\' 6"  (1.676 m), weight 152 lb 1.9 oz (69 kg), SpO2 99%.  Intake/Output from previous day: No intake/output data recorded.  Physical Exam:  HEENT: The mouth is dry, no thrush Lymph nodes: No cervical or supraclavicular nodes Lungs: Clear anteriorly, distant breath sounds Cardiac: Regular rhythm with premature beats Abdomen: Tender in the right upper abdomen, no hepatosplenomegaly, no mass Extremities: No leg edema, diminished skin turgor   Lab Results: Recent Labs    12/04/22 0827 12/04/22 0918 12/04/22 1635  WBC 4.4 5.7  --   HGB 3.5* 3.5* 6.2*  HCT 11.8* 11.7* 19.4*  PLT 131* 137*  --     BMET Recent Labs    12/04/22 0918 12/04/22 1635  NA 131* 131*  K 4.7 4.2  CL 104 101  CO2 15* 16*  GLUCOSE 168* 197*  BUN 121* 116*  CREATININE 11.06* 10.40*  CALCIUM 9.0 8.6*    No results found for: "CEA1", "CEA", "UJW119", "CA125"  Studies/Results: CT Chest Wo Contrast  Result Date: 12/04/2022 CLINICAL DATA:  Blunt chest trauma. EXAM: CT CHEST, ABDOMEN AND PELVIS WITHOUT CONTRAST TECHNIQUE: Multidetector CT imaging of the chest, abdomen and pelvis was performed following the standard protocol without IV contrast.  RADIATION DOSE REDUCTION: This exam was performed according to the departmental dose-optimization program which includes automated exposure control, adjustment of the mA and/or kV according to patient size and/or use of iterative reconstruction technique. COMPARISON:  Chest CT dated 09/26/2022. FINDINGS: CT CHEST FINDINGS Cardiovascular: No thoracic aortic aneurysm. No pericardial effusion. Mediastinum/Nodes: No mass or enlarged lymph nodes within the mediastinum. No hemorrhage or edema is seen within the mediastinum. Esophagus is unremarkable. Trachea is unremarkable. Lungs/Pleura: Mild dependent atelectasis bilaterally. Lungs appear otherwise clear. No pleural effusion or hemothorax. No pneumothorax. Musculoskeletal: Innumerable lytic lesions throughout the thoracic spine and bilateral ribs, corresponding to patient's known multiple myeloma. Associated large expansile lesion within the anterior second RIGHT rib. Multiple associated pathologic rib fractures bilaterally, of uncertain age but likely chronic. Displaced fracture of the lower RIGHT scapula, also likely pathologic related to underlying lytic lesions (multiple myeloma), of uncertain age. No acute appearing fracture or subluxation within the thoracic spine. Large expansile mass involving the T4 vertebral body with soft tissue component extending into the central canal and LEFT neural foramen, with almost certain mass effect on the thoracic cord and exiting nerve root. CT ABDOMEN PELVIS FINDINGS Hepatobiliary: No focal liver abnormality is seen. Gallbladder is unremarkable. No perihepatic hematoma. No bile duct dilatation is seen. Pancreas: Partially infiltrated with fat but otherwise unremarkable. Spleen: No splenic injury or perisplenic hematoma. Adrenals/Urinary Tract: Adrenal glands appear normal. No adrenal hemorrhage. No renal injury identified. No renal stone or hydronephrosis. No ureteral or bladder calculi are identified.  Bladder is unremarkable.  Stomach/Bowel: No dilated large or small bowel loops. No evidence of bowel wall inflammation or bowel wall injury. Appendix is normal. Stomach is unremarkable. Vascular/Lymphatic: No abdominal aortic aneurysm. No periaortic hemorrhage. No enlarged lymph nodes are seen in the abdomen or pelvis. Reproductive: Moderate prostate gland enlargement with some associated mass effect on the bladder base. Other: No free fluid or hemorrhage is seen within the abdomen or pelvis. No free intraperitoneal air. Musculoskeletal: Innumerable lytic lesions throughout the lumbar spine and osseous pelvis, corresponding to patient's known multiple myeloma. Questionable mild compression deformity of the L4 vertebral body, likely pathologic, nonacute and related to the underlying lytic changes of patient's multiple myeloma. No acute-appearing osseous abnormality is seen within the abdomen or pelvis. IMPRESSION: 1. Innumerable lytic lesions throughout the thoracic spine, bilateral ribs, lumbar spine and osseous pelvis, corresponding to patient's known multiple myeloma. 2. Large expansile mass involving the T4 vertebral body with soft tissue component extending into the central canal and LEFT neural foramen, with almost certain mass effect on the thoracic cord and exiting nerve root. Consider MRI of the thoracic spine without and with IV contrast to further evaluate. 3. Multiple associated pathologic rib fractures bilaterally, of uncertain age but likely chronic. 4. Displaced fracture of the lower RIGHT scapula, of uncertain age but also likely pathologic related to underlying lytic lesions (multiple myeloma). 5. Questionable mild compression deformity of the L4 vertebral body, likely pathologic, nonacute and related to the underlying lytic changes of patient's multiple myeloma. 6. No acute findings within the chest, abdomen or pelvis. No evidence of solid organ injury. No hemorrhage or edema within the mediastinum. No pleural effusion or  pneumothorax. No evidence of bowel wall injury. 7. Moderate prostate gland enlargement with some associated mass effect on the bladder base. Electronically Signed   By: Bary Richard M.D.   On: 12/04/2022 08:42   CT ABDOMEN PELVIS WO CONTRAST  Result Date: 12/04/2022 CLINICAL DATA:  Blunt chest trauma. EXAM: CT CHEST, ABDOMEN AND PELVIS WITHOUT CONTRAST TECHNIQUE: Multidetector CT imaging of the chest, abdomen and pelvis was performed following the standard protocol without IV contrast. RADIATION DOSE REDUCTION: This exam was performed according to the departmental dose-optimization program which includes automated exposure control, adjustment of the mA and/or kV according to patient size and/or use of iterative reconstruction technique. COMPARISON:  Chest CT dated 09/26/2022. FINDINGS: CT CHEST FINDINGS Cardiovascular: No thoracic aortic aneurysm. No pericardial effusion. Mediastinum/Nodes: No mass or enlarged lymph nodes within the mediastinum. No hemorrhage or edema is seen within the mediastinum. Esophagus is unremarkable. Trachea is unremarkable. Lungs/Pleura: Mild dependent atelectasis bilaterally. Lungs appear otherwise clear. No pleural effusion or hemothorax. No pneumothorax. Musculoskeletal: Innumerable lytic lesions throughout the thoracic spine and bilateral ribs, corresponding to patient's known multiple myeloma. Associated large expansile lesion within the anterior second RIGHT rib. Multiple associated pathologic rib fractures bilaterally, of uncertain age but likely chronic. Displaced fracture of the lower RIGHT scapula, also likely pathologic related to underlying lytic lesions (multiple myeloma), of uncertain age. No acute appearing fracture or subluxation within the thoracic spine. Large expansile mass involving the T4 vertebral body with soft tissue component extending into the central canal and LEFT neural foramen, with almost certain mass effect on the thoracic cord and exiting nerve root.  CT ABDOMEN PELVIS FINDINGS Hepatobiliary: No focal liver abnormality is seen. Gallbladder is unremarkable. No perihepatic hematoma. No bile duct dilatation is seen. Pancreas: Partially infiltrated with fat but otherwise unremarkable. Spleen: No splenic injury or perisplenic  hematoma. Adrenals/Urinary Tract: Adrenal glands appear normal. No adrenal hemorrhage. No renal injury identified. No renal stone or hydronephrosis. No ureteral or bladder calculi are identified. Bladder is unremarkable. Stomach/Bowel: No dilated large or small bowel loops. No evidence of bowel wall inflammation or bowel wall injury. Appendix is normal. Stomach is unremarkable. Vascular/Lymphatic: No abdominal aortic aneurysm. No periaortic hemorrhage. No enlarged lymph nodes are seen in the abdomen or pelvis. Reproductive: Moderate prostate gland enlargement with some associated mass effect on the bladder base. Other: No free fluid or hemorrhage is seen within the abdomen or pelvis. No free intraperitoneal air. Musculoskeletal: Innumerable lytic lesions throughout the lumbar spine and osseous pelvis, corresponding to patient's known multiple myeloma. Questionable mild compression deformity of the L4 vertebral body, likely pathologic, nonacute and related to the underlying lytic changes of patient's multiple myeloma. No acute-appearing osseous abnormality is seen within the abdomen or pelvis. IMPRESSION: 1. Innumerable lytic lesions throughout the thoracic spine, bilateral ribs, lumbar spine and osseous pelvis, corresponding to patient's known multiple myeloma. 2. Large expansile mass involving the T4 vertebral body with soft tissue component extending into the central canal and LEFT neural foramen, with almost certain mass effect on the thoracic cord and exiting nerve root. Consider MRI of the thoracic spine without and with IV contrast to further evaluate. 3. Multiple associated pathologic rib fractures bilaterally, of uncertain age but likely  chronic. 4. Displaced fracture of the lower RIGHT scapula, of uncertain age but also likely pathologic related to underlying lytic lesions (multiple myeloma). 5. Questionable mild compression deformity of the L4 vertebral body, likely pathologic, nonacute and related to the underlying lytic changes of patient's multiple myeloma. 6. No acute findings within the chest, abdomen or pelvis. No evidence of solid organ injury. No hemorrhage or edema within the mediastinum. No pleural effusion or pneumothorax. No evidence of bowel wall injury. 7. Moderate prostate gland enlargement with some associated mass effect on the bladder base. Electronically Signed   By: Bary Richard M.D.   On: 12/04/2022 08:42   CT L-SPINE NO CHARGE  Result Date: 12/04/2022 CLINICAL DATA:  Fall. EXAM: CT LUMBAR SPINE WITHOUT CONTRAST TECHNIQUE: Multidetector CT imaging of the lumbar spine was performed without intravenous contrast administration. Multiplanar CT image reconstructions were also generated. RADIATION DOSE REDUCTION: This exam was performed according to the departmental dose-optimization program which includes automated exposure control, adjustment of the mA and/or kV according to patient size and/or use of iterative reconstruction technique. COMPARISON:  Plain film of the lumbar spine dated 11/22/2015. FINDINGS: Segmentation: 5 lumbar type vertebrae. Alignment: Stable.  No evidence of acute vertebral body subluxation. Vertebrae: No fracture line or displaced fracture fragment is seen. Questionable mild compression of the L4 vertebral body which is likely nonacute. Innumerable lytic lesions throughout the lumbar spine, corresponding to patient's known multiple myeloma. The mild compression of the L4 vertebral body is likely related to the underlying lytic changes. Paraspinal and other soft tissues: Visualized immediate paravertebral soft tissues are unremarkable. Disc levels: Disc desiccations at each level of the lumbar spine,  with associated disc space narrowings and osteophyte formation, causing moderate to severe central canal stenoses at the L2-3 through L4-5 levels and mild-to-moderate central canal stenosis at L5-S1. Various degrees of neural foramen narrowing at each level of the lumbar spine as well, with possible associated nerve root impingement. IMPRESSION: 1. No acute fracture or subluxation within the lumbar spine. 2. Innumerable lytic lesions throughout the lumbar spine, corresponding to patient's known multiple myeloma. 3. Questionable interval  mild compression of the L4 vertebral body which is likely nonacute and related to the underlying lytic lesions (multiple myeloma). 4. Multilevel degenerative disc disease, causing moderate to severe central canal stenoses at the L2-3 through L4-5 levels and mild-to-moderate central canal stenosis at L5-S1, with possible associated nerve root impingement at 1 or more levels. Electronically Signed   By: Bary Richard M.D.   On: 12/04/2022 08:30   CT Head Wo Contrast  Result Date: 12/04/2022 CLINICAL DATA:  Head trauma, moderate-severe; Neck trauma (Age >= 65y) EXAM: CT HEAD WITHOUT CONTRAST CT CERVICAL SPINE WITHOUT CONTRAST TECHNIQUE: Multidetector CT imaging of the head and cervical spine was performed following the standard protocol without intravenous contrast. Multiplanar CT image reconstructions of the cervical spine were also generated. RADIATION DOSE REDUCTION: This exam was performed according to the departmental dose-optimization program which includes automated exposure control, adjustment of the mA and/or kV according to patient size and/or use of iterative reconstruction technique. COMPARISON:  None Available. FINDINGS: CT HEAD FINDINGS Brain: No hemorrhage. No extra-axial fluid collection. No hydrocephalus. No CT evidence of an acute cortical infarct. Mild chronic microvascular ischemic change. Vascular: No hyperdense vessel or unexpected calcification. Skull: No  acute fracture. There are nonspecific scattered lucent lesions throughout the calvarium, largest which is at the vertex (series 7, image 41). Sinuses/Orbits: No middle ear or mastoid effusion. Polypoid mucosal thickening in the right sphenoid left maxillary sinus. Orbits are unremarkable. Other: None. CT CERVICAL SPINE FINDINGS Alignment: Straightening of the normal cervical lordosis. Skull base and vertebrae: There is a mildly displaced fracture through the right C7 transverse process (series 5, image 30). 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 There are lytic lesions throughout the cervical spine involving every cervical vertebral body. Some of the lesions extend through the posterior cortex, for example at the right posterior C4 endplate (series 7, image 35) in the left aspect of T3 (series 3, image 2). Metastatic lesions are also seen in the bilateral scapula. Soft tissues and spinal canal: There is likely severe spinal canal stenosis at the T3 level secondary to epidural extension of tumor (series 9, image 1). Disc levels:  See above Upper chest: Negative. Other: None IMPRESSION: 1. No acute intracranial abnormality. 2. Mildly displaced fracture through the right C7 transverse process. 3. Multiple lytic lesions throughout the cervical spine, calvarium, and bilateral scapula. Findings could be seen in the setting of diffuse metastatic disease of myeloma. Some of the lesions extend through the posterior cortex, for example at the right posterior C4 endplate and left aspect of T3, with likely severe spinal canal stenosis at the T3 level secondary to epidural extension of tumor. Recommend MRI of the cervical and thoracic spine with and without contrast for further evaluation. Electronically Signed   By: Lorenza Cambridge M.D.   On: 12/04/2022 08:30   CT Cervical Spine Wo Contrast  Result Date: 12/04/2022 CLINICAL DATA:  Head trauma, moderate-severe; Neck trauma (Age >= 65y) EXAM: CT HEAD  WITHOUT CONTRAST CT CERVICAL SPINE WITHOUT CONTRAST TECHNIQUE: Multidetector CT imaging of the head and cervical spine was performed following the standard protocol without intravenous contrast. Multiplanar CT image reconstructions of the cervical spine were also generated. RADIATION DOSE REDUCTION: This exam was performed according to the departmental dose-optimization program which includes automated exposure control, adjustment of the mA and/or kV according to patient size and/or use of iterative reconstruction technique. COMPARISON:  None Available. FINDINGS: CT HEAD FINDINGS Brain: No hemorrhage. No extra-axial fluid collection. No hydrocephalus. No CT  evidence of an acute cortical infarct. Mild chronic microvascular ischemic change. Vascular: No hyperdense vessel or unexpected calcification. Skull: No acute fracture. There are nonspecific scattered lucent lesions throughout the calvarium, largest which is at the vertex (series 7, image 41). Sinuses/Orbits: No middle ear or mastoid effusion. Polypoid mucosal thickening in the right sphenoid left maxillary sinus. Orbits are unremarkable. Other: None. CT CERVICAL SPINE FINDINGS Alignment: Straightening of the normal cervical lordosis. Skull base and vertebrae: There is a mildly displaced fracture through the right C7 transverse process (series 5, image 30). 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 There are lytic lesions throughout the cervical spine involving every cervical vertebral body. Some of the lesions extend through the posterior cortex, for example at the right posterior C4 endplate (series 7, image 35) in the left aspect of T3 (series 3, image 2). Metastatic lesions are also seen in the bilateral scapula. Soft tissues and spinal canal: There is likely severe spinal canal stenosis at the T3 level secondary to epidural extension of tumor (series 9, image 1). Disc levels:  See above Upper chest: Negative. Other: None IMPRESSION:  1. No acute intracranial abnormality. 2. Mildly displaced fracture through the right C7 transverse process. 3. Multiple lytic lesions throughout the cervical spine, calvarium, and bilateral scapula. Findings could be seen in the setting of diffuse metastatic disease of myeloma. Some of the lesions extend through the posterior cortex, for example at the right posterior C4 endplate and left aspect of T3, with likely severe spinal canal stenosis at the T3 level secondary to epidural extension of tumor. Recommend MRI of the cervical and thoracic spine with and without contrast for further evaluation. Electronically Signed   By: Lorenza Cambridge M.D.   On: 12/04/2022 08:30   DG Chest Portable 1 View  Result Date: 12/04/2022 CLINICAL DATA:  Status post fall. EXAM: PORTABLE CHEST 1 VIEW COMPARISON:  PET-CT 10/06/2022 FINDINGS: Heart size and mediastinal contours are unremarkable. No pleural fluid or interstitial edema. No airspace consolidation. 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 right upper lobe lung mass is again seen measuring approximately 4.4 cm on today's study. Again seen are extensive, multifocal lytic bone metastases which involve bilateral ribs. Age indeterminate pathologic fractures involving the lateral aspect of the right fourth rib and left posterior sixth rib noted. IMPRESSION: 1. No acute cardiopulmonary disease. 2. Right upper lobe lung mass. 3. Extensive lytic bone metastases. Age indeterminate pathologic fractures involving the lateral aspect of the right fourth rib and left posterior sixth rib. Electronically Signed   By: Signa Kell M.D.   On: 12/04/2022 06:06    Medications: I have reviewed the patient's current medications.  Assessment/Plan: Multiple myeloma, IgG kappa, diagnosed May 2024-untreated CTs 12/04/2022-innumerable lytic lesions, large expansile lesion in the anterior right second rib, multiple pathologic rib fractures, expansile T4 (T3 on CT cervical spine) mass with soft  tissue component extending into the central canal and left neural foramen, C7 transverse process fracture 2.  Acute/chronic renal failure 3.  Severe anemia 4.  Mild thrombocytopenia 5.  Diabetes 6.  Hypertension 7.  Glaucoma 8.  OSA  Mr. Arena was recently diagnosed with multiple myeloma.  He has not been treated for myeloma.  He presents today after a fall.  He has severe anemia and acute renal failure.  Anemia is likely secondary to untreated multiple myeloma.  The markedly elevated creatinine may be related to multiple myeloma (though the serum free light chains are not markedly elevated) and dehydration.  Pain is  likely secondary to rib fractures.  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  He does not have neurologic symptoms suggestive of a cord compression syndrome.  We can consider radiation and initiation of palliative Decadron if he develops focal neurologic symptoms.  Mr. Horta has no family member present this evening.  He has been evaluated by the nephrology service and is not felt to be a candidate for dialysis.  It is unclear whether the renal failure is in large part secondary to dehydration in the setting of underlying kidney disease versus myeloma.  The light chains are only mildly elevated which may argue against light chain nephropathy.  Mr. Schweikert says he would like to begin treatment for myeloma.  We will follow the creatinine over the next several days and make a decision on initiating systemic therapy with Velcade/Decadron.  Recommendations: Intravenous hydration Transfuse packed red blood cells Daily CBC and chemistry panel Management of renal failure per nephrology Consider initiating systemic therapy for myeloma with Velcade/Decadron depending on recovery of renal function and his performance status over the next few days.      LOS: 0 days   Thornton Papas, MD   12/04/2022, 7:21 PM

## 2022-12-04 NOTE — Progress Notes (Signed)
Spoke at length w/ wife Bonita Quin Pt has been home from hospitalization since end of may.  When he got home at end of May was able to get up to [facility] w/ walker and walk short distances.  She noted significant decrease in energy Spent most of time on sofa.  Got to point end of June incontinent at times.  Appetite poor w/ limited PO intake for last 3 weeks or so.    Missed PET scan d/t transport issues and  also issue finding Veins for the scan so had to be rescheduled    I updated her on findings thus far and plan as outlined. We are awaiting Onc eval today  F/u labs just sent   Simonne Martinet ACNP-BC [facility] Pulmonary/Critical Care Pager # [phone] OR # [phone] if no answer

## 2022-12-04 NOTE — H&P (Addendum)
NAME:  Scott Bass, MRN:  [id number], DOB:  Sep 19, 1950, LOS: 0 ADMISSION DATE:  12/04/2022, CONSULTATION DATE:  7/29 REFERRING MD:  Rancour, CHIEF COMPLAINT: Acute on chronic renal failure, severe anemia, acute metabolic encephalopathy  History of Present Illness:  This is a 72 year old male patient who is typically wheelchair-bound.  Recently diagnosed with multiple myeloma following history of transfusion requiring anemia.Most recently seen by oncology on 5/30 with plans to initiate dexamethasone and daratumanub.  He has yet to initiate therapy, and has yet to initiate chemotherapy education presented to the emergency room on 7/29.  After EMS was called to the home for a fall.  On arrival he was found to be tachycardic, slightly confused, complaining of severe dry mouth and constipation.  Denied taking NSAIDs.  No fever chills or dyspnea.  Was brought to the emergency room for further evaluation.  On evaluation he was normotensive, heart rate 126, had dry mucous membranes, initial lab data showed sodium 128 potassium of 6.5 BUN of 139 creatinine of 11.76, pH was 7.21 initial lactate was 6, troponin I was unremarkable, initial CBC with a hemoglobin of 3.5.  He was administered IV crystalloid in the emergency room, cultures were sent, and he was empirically treated with antibiotics given the elevated lactate, and blood transfusion was ordered.  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 CT brain was negative CT cervical spine showed mildly displaced fracture at C7, multiple lytic lesions in the cervical spine there was also lesion extending through the posterior cortex of the right posterior C4 endplate and left aspect of T3 felt to be disease involvement of the spinal canal with epidural extension of tumor there were no acute fractures noted there were innumerable lesions throughout the lumbar spine with mild compression of the L4 vertebral body, there innumerable lytic lesions throughout the thoracic spine, bilateral ribs, lumbar spine and osseous pelvis he also had a large expansile mass involving T4 vertebral body with soft tissue component extending into the central canal multiple associated multiple rib fractures felt pathologic.  Started on IV hydration, transfused, nephrology consulted and felt not a candidate for dialysis.  Hematology and palliative care consult pending  Interim History / Subjective:  Reports he feels dry and constipated  Objective   Blood pressure (!) 153/69, pulse (!) 117, temperature 98.1 F (36.7 C), temperature source Oral, resp. rate 19, SpO2 100%.       No intake or output data in the 24 hours ending 12/04/22 1020 There were no vitals filed for this visit.  Examination: General: Chronically ill-appearing 72 year old male patient currently in no acute distress but does report feeling dry and constipated HENT: Mucous membranes are dry sclera nonicteric neck veins flat Lungs: Clear diminished bases Cardiovascular: Mildly tachycardic no murmur rub or gallop Abdomen: Soft nontender Extremities: Warm dry Neuro: Awake, moves all extremities, oriented x 3 but has poor recall GU: Incontinent  Resolved Hospital  Problem list    Assessment & Plan:  Acute metabolic encephalopathy.  Status  post fall secondary to Uremia and acute renal failure.  Likely further exacerbated by metabolic acidosis.  He has had history of hypercalcemia and driving delirium in the past however his calcium is normal Plan Continuing supportive care Hold sedation as able Treating empirically for sepsis but feel like he is not infected   Acute on chronic stage IV renal failure Hospital secondary to mixed picture of dehydration and anemia, further complicated by known underlying multiple myeloma Plan Continue IV hydration, with bicarbonate  transfuse. Holding antihypertensives Renal dose medications Place Foley catheter Strict intake output I spoke with nephrology, we both agree he is not a candidate for dialysis given his advanced malignancy  Anion gap metabolic acidosis.  Mixed picture of lactic acidosis which is cleared with volume, and acute renal failure Plan Serial chemistries F/u lactate   Fluid and electrolyte imbalance: Hyperkalemia, hyponatremia context of acute on chronic renal failure.  Hyperglycemia likely contributing to some of his hyponatremia Plan He is already received urgent treatment in the emergency room for his hyperkalemia including calcium, Lasix, insulin, and D50. Follow-up pending chemistry I think he can tolerate Lokelma if needed  Progressive multiple myeloma with extensive bone involvement. He has yet to initiate therapy.  He is wheelchair-bound, severely debilitated, and now with acute renal failure Plan Will have heme/onc see while he is here. Not sure if things have progressed too far.  I really think at this point we are looking at hospice and palliative care focus Will ask palliative to see as well CT imaging Follow-up lactate suggesting T4 vertebral body soft tissue component extending into the central canal with a left neuro foramen, raising concern for mass effect on thoracic cord.  At this point I am not sure there is anything we can do for him, could consider  follow-up MRI but will wait on heme/ONC eval.  Interim of think we need to consider hospice care  Symptomatic anemia. In the setting of multiple myeloma hemoglobin 3.5.  He is required blood transfusion in the past. Plan Transfuse Serial CBCs Holding anticoagulation Hemoccult stools  Constipation Plan Dulcolax sup   Hypothyroidism  Plan Cont synthroid   Thrombocytopenia Plan Trend   Severe hyperglycemia  Plan Ssi  Ck betahydroxybuteric acid   Best Practice (right click and "Reselect all SmartList Selections" daily)   Diet/type: NPO w/ oral meds DVT prophylaxis: SCD GI prophylaxis: PPI Lines: N/A Foley:  Yes, and it is still needed Code Status:  full code Last date of multidisciplinary goals of care discussion [pending ]  Labs   CBC: Recent Labs  Lab 12/04/22 0827 12/04/22 0918  WBC 4.4 5.7  NEUTROABS 2.9 3.1  HGB 3.5* 3.5*  HCT 11.8* 11.7*  MCV 99.2 96.7  PLT 131* 137*    Basic Metabolic Panel: Recent Labs  Lab 12/04/22 0515 12/04/22 0918  NA 128* 131*  K 6.5* 4.7  CL 104 104  CO2 <7* 15*  GLUCOSE 243* 168*  BUN 139* 121*  CREATININE 11.76* 11.06*  CALCIUM 9.5 9.0   GFR: CrCl cannot be calculated (Unknown ideal weight.). Recent Labs  Lab 12/04/22 0529 12/04/22 0819 12/04/22 0827 12/04/22 0918  WBC  --   --  4.4 5.7  LATICACIDVEN 6.0* 2.7*  --   --     Liver Function Tests: Recent Labs  Lab 12/04/22 0515  AST 35  ALT 28  ALKPHOS 62  BILITOT 1.0  PROT >12.0*  ALBUMIN 2.6*   No results for input(s): "LIPASE", "AMYLASE" in the last 168 hours. No results for input(s): "AMMONIA" in the last 168 hours.  ABG    Component Value Date/Time   HCO3 8.4 (L) 12/04/2022 0515   ACIDBASEDEF 17.5 (H) 12/04/2022 0515   O2SAT 78.6 12/04/2022 0515     Coagulation Profile: Recent Labs  Lab 12/04/22 0515  INR 1.5*    Cardiac Enzymes: Recent Labs  Lab 12/04/22 0515  CKTOTAL 66    HbA1C: Hgb A1c MFr Bld  Date/Time Value Ref  Range Status  09/26/2022 02:17 AM 7.3 (H) 4.8 - 5.6 % Final    Comment:    (NOTE) Pre diabetes:          5.7%-6.4%  Diabetes:              >6.4%  Glycemic control for   <7.0% adults with diabetes   09/05/2006 09:50 AM 5.9 4.6 - 6.0 % Final    Comment:    See lab report for associated comment(s)    CBG: Recent Labs  Lab 12/04/22 0542  GLUCAP 167*    Review of Systems:   Not able   Past Medical History:  He,  has a past medical history of Arthritis, Better eye: moderate vision impairment; lesser eye: blind, GERD (gastroesophageal reflux disease), Glaucoma, both eyes, High cholesterol, Hypertension, Hypothyroidism, Irregular heartbeat, Legally blind, OSA (obstructive sleep apnea), Thyroid disease, Type II diabetes mellitus (HCC), and Vitamin B12 deficiency.   Surgical History:   Past Surgical History:  Procedure Laterality Date   LEFT HEART CATH AND CORONARY ANGIOGRAPHY N/A 12/18/2017   Procedure: LEFT HEART CATH AND CORONARY ANGIOGRAPHY;  Surgeon: Elder Negus, MD;  Location: [facility];  Service: Cardiovascular;  Laterality: N/A;   NOSE SURGERY     "was crooked; they straightened it out" (12/18/2017)     Social History:   reports that he has never smoked. He has never used smokeless tobacco. He reports current alcohol use. He reports that he does not use drugs.   Family History:  His family history includes Breast cancer in his sister; Diabetes Mellitus II in his sister; Diabetes Mellitus II (age of onset: 53) in his mother; Heart failure (age of onset: 65) in his sister.   Allergies Allergies  Allergen Reactions   Hctz [Hydrochlorothiazide] Other (See Comments)    Pancreatitis      Home Medications  Prior to Admission medications   Medication Sig Start Date End Date Taking? Authorizing Provider  amLODipine (NORVASC) 5 MG tablet TAKE 1 TABLET(5 MG) BY MOUTH DAILY Patient taking differently: Take 5 mg by mouth daily. 12/28/20   Yates Decamp, MD   aspirin 81 MG tablet Take 81 mg by mouth daily.    [provider]  brimonidine (ALPHAGAN) 0.2 % ophthalmic solution Place 1 drop into both eyes 3 (three) times daily.  01/17/15   [provider]  empagliflozin (JARDIANCE) 25 MG TABS tablet Take 1 tablet (25 mg total) by mouth daily before breakfast. 12/17/20   Yates Decamp, MD  ferrous sulfate 325 (65 FE) MG tablet Take 325 mg by mouth every other day. 07/18/22   [provider]  glimepiride (AMARYL) 1 MG tablet Take 1 mg by mouth every morning. 07/27/22   [provider]  hydrALAZINE (APRESOLINE) 50 MG tablet TAKE 1 TABLET(50 MG) BY MOUTH THREE TIMES DAILY Patient taking differently: Take 50 mg by mouth 3 (three) times daily. 12/09/21   Yates Decamp, MD  latanoprost (XALATAN) 0.005 % ophthalmic solution Place 1 drop into both eyes at bedtime. Patient not taking: Reported on 09/26/2022    [provider]  levothyroxine (SYNTHROID, LEVOTHROID) 150 MCG tablet Take 150 mcg by mouth daily. 12/10/14   [provider]  Magnesium Oxide 400 MG CAPS Take 1 capsule by mouth daily. 09/19/22   [provider]  metFORMIN (GLUCOPHAGE) 1000 MG tablet Take 500 mg by mouth 2 (two) times daily with a meal.     [provider]  Metoprolol Tartrate 75 MG TABS Take 75 mg by mouth 2 (two) times daily.     [provider]  ondansetron (ZOFRAN) 8 MG tablet Take 1 tablet (8 mg total) by mouth every 8 (eight) hours as needed for nausea or vomiting. 10/05/22   Johney Maine, MD  oxyCODONE (OXY IR/ROXICODONE) 5 MG immediate release tablet Take 2.5 mg by mouth 4 (four) times daily as needed for moderate pain, severe pain or breakthrough pain. 09/20/22   [provider]  polyethylene glycol (MIRALAX) 17 g packet Take 17 g by mouth daily. 10/05/22   Johney Maine, MD  Rosuvastatin Calcium 20 MG CPSP Take 20 mg by mouth daily.     [provider]  tamsulosin (FLOMAX) 0.4 MG CAPS  capsule Take 0.4 mg by mouth daily. 08/22/22   [provider]     Critical care time: 55 min     Simonne Martinet ACNP-BC [facility] Pulmonary/Critical Care Pager # [phone] OR # [phone] if no answer

## 2022-12-04 NOTE — Progress Notes (Signed)
F/u hgb 6.2, PRBC ordered

## 2022-12-04 NOTE — ED Notes (Signed)
Critical lab was reported Hemoglobin of 4.2. As per nurse Ariel will be repeated for confirmation.

## 2022-12-04 NOTE — ED Provider Notes (Signed)
Patient received in handoff from overnight attending.  Briefly this is a 72 year old male with a history of multiple myeloma, blindness, presenting to the ED with concern for multiorgan failure.  Patient presented initially for a fall and weakness.  He is found to be tachycardic, with seemingly elevated lactate level, acute renal failure, elevated BUN, hyperkalemia, anion gap acidosis.  This is thought to be likely sequelae of both severe dehydration as well as multiple myeloma.  The patient had been diagnosed and started on treatment for myeloma earlier this year, but does not appear to have undergone consistent treatment.  Attempts are made to contact the patient's wife overnight to discuss prognosis and advanced directive or CODE STATUS, but she was not reachable by phone numbers provided.  The patient himself appears demented and cannot provide reliable history, complaining only of feeling thirsty.  Patient was also noted to be anemic with hemoglobin 4.4.  Rectal exam was negative for blood per Dr. Manus Gunning  Patient is currently receiving broad-spectrum antibiotics with elevated lactate level.  Patient is receiving IV fluids as well as bicarb infusion.  Patient has been ordered 2 units of blood additionally.  We are awaiting callback from nephrology as well as intensive care.  Patient remains mildly tachycardic but blood pressure has been within normal limits.  Patient also received treatment for hyperkalemia with calcium gluconate, insulin, and fluid boluses  Physical Exam  BP 133/74   Pulse (!) 117   Temp (!) 96.7 F (35.9 C) (Rectal)   Resp 19   SpO2 100%   Physical Exam  Procedures  Procedures  ED Course / MDM   Clinical Course as of 12/04/22 0940  Mon Dec 04, 2022  [id number] Secretary asked to repage Critical Care & Nephrology [MT]  [phone] Lactate 6.0 -> 2.7 [MT]  8119 Mildly displaced C7 TP fracture, may be pathologic with MM bony lesions [MT]  0906 Asking secretary to repage, now 2  hours since original page to critical care and nephrology [MT]  0910 Called back by Dr. Everardo [facility] who will admit [MT]  [phone] Spoke to nephrology - they will review chart and give recommendations, okay to admit to [facility] [MT]    Clinical Course User Index [MT] Terald Sleeper, MD   Medical Decision Making Amount and/or Complexity of Data Reviewed Labs: ordered. Radiology: ordered.  Risk OTC drugs. Prescription drug management. Decision regarding hospitalization.         Terald Sleeper, MD 12/04/22 [phone]

## 2022-12-04 NOTE — Progress Notes (Signed)
F/u  Remains encephalopathic.  Hemodynamics stable MRSA swab neg PCT slightly elevated.  F/u chem improving w/ IVF and bicarb Keeps complaining of abd discomfort and constipation  Plan Cont IV fluids Complete x fusion and f/u serial CBC Strict I&O Dulcolax sup  Spoke to Onc on phone. They may consider treatment here in hospital for his multiple myeloma but I am concerned about his baseline functional status and if this is really even a reasonable expectation.   Scott Bass ACNP-BC [facility] Pulmonary/Critical Care Pager # [phone] OR # [phone] if no answer

## 2022-12-04 NOTE — Progress Notes (Signed)
Pharmacy Antibiotic Note  Scott Bass is a 72 y.o. male admitted on 12/04/2022 with  with CKD, blindness, WC bound, HTN, DM recent diagnosis of multiple myeloma who presented to the ED this AM after a fall at home.  Nephrology consulted for evaluation and management of AKI on CKD accompanied by hyperkalemia and acidosis.  Pharmacy has been consulted for ceftriaxone dosing for possible UTI.   In ED, in addition to vancomycin & Flagyl, patient received cefepime 2 g IV x 1 on 7/29 at 0640.  Based on current est crcl of 5.72ml/min, cefepime dosing would be q24h.  Plan: Ceftriaxone 2 g IV q24h beginning tomorrow 7/30 Monitor clinical progress, C&S, abx deescalation / LOT   Height: 5\' 6"  (167.6 cm) Weight: 69 kg (152 lb 1.9 oz) IBW/kg (Calculated) : 63.8  Temp (24hrs), Avg:97.7 F (36.5 C), Min:96.7 F (35.9 C), Max:98.1 F (36.7 C)  Recent Labs  Lab 12/04/22 0515 12/04/22 0529 12/04/22 0819 12/04/22 0827 12/04/22 0918  WBC  --   --   --  4.4 5.7  CREATININE 11.76*  --   --   --  11.06*  LATICACIDVEN  --  6.0* 2.7*  --   --     Estimated Creatinine Clearance: 5.5 mL/min (A) (by C-G formula based on SCr of 11.06 mg/dL (H)).    Allergies  Allergen Reactions   Hctz [Hydrochlorothiazide] Other (See Comments)    Pancreatitis     Antimicrobials this admission: 7/29 cefepime 2g IV, Flagyl, vancomycin 1g IV x  1 each 7/30 ceftriaxone >>   Microbiology results: 7/29 BCx: sent 7/29 MRSA PCR: not detected  Thank you for allowing pharmacy to be a part of this patient's care.  Selinda Eon, PharmD, BCPS Clinical Pharmacist [name] 12/04/2022 1:45 PM

## 2022-12-04 NOTE — Progress Notes (Signed)
A consult was received from an ED physician for vanc and cefepime per pharmacy dosing.  The patient's profile has been reviewed for ht/wt/allergies/indication/available labs.   A one time order has been placed for vanc 1gm and cefepime 2gm.    Further antibiotics/pharmacy consults should be ordered by admitting physician if indicated.                       Thank you, Arley Phenix RPh 12/04/2022, 5:59 AM

## 2022-12-04 NOTE — ED Provider Notes (Signed)
[name] Provider Note   CSN: [id number] Arrival date & time: 12/04/22  0402     History  Chief Complaint  Patient presents with   Fall    Pt fell at home on carpet while walking with walkers, pt CBG per EMS 318 , tachy 140. Pt visionally impaired , vision in left eye only, wife called, also vision impaired, dementia , pt on metformin, and ASA. Pt denies LOC or hitting head     Scott Bass is a 72 y.o. male.  Patient with hypertension, thyroid disease, multiple myeloma, diabetes, visual impairment presenting after fall.  He is a poor historian.  EMS reports he had a fall going from the living room on his walker and landed on his buttocks.  Did not hit head.  Complains of pain to his left hip as well as "dry mouth".  He fell walking with his walker and his wife was able to get him up.  Tachycardic to the 140s for EMS.  Denies head, neck, back, chest or abdominal pain.  Complains of pain to his left hip and dry mouth. No blood thinner use.  No chest pain or shortness of breath.  No nausea vomiting, cough or fever  The history is provided by the patient.  Fall Pertinent negatives include no chest pain, no abdominal pain, no headaches and no shortness of breath.       Home Medications Prior to Admission medications   Medication Sig Start Date End Date Taking? Authorizing Provider  amLODipine (NORVASC) 5 MG tablet TAKE 1 TABLET(5 MG) BY MOUTH DAILY Patient taking differently: Take 5 mg by mouth daily. 12/28/20   Yates Decamp, MD  aspirin 81 MG tablet Take 81 mg by mouth daily.    [provider]  brimonidine (ALPHAGAN) 0.2 % ophthalmic solution Place 1 drop into both eyes 3 (three) times daily.  01/17/15   [provider]  empagliflozin (JARDIANCE) 25 MG TABS tablet Take 1 tablet (25 mg total) by mouth daily before breakfast. 12/17/20   Yates Decamp, MD  ferrous sulfate 325 (65 FE) MG tablet Take 325 mg by mouth every  other day. 07/18/22   [provider]  glimepiride (AMARYL) 1 MG tablet Take 1 mg by mouth every morning. 07/27/22   [provider]  hydrALAZINE (APRESOLINE) 50 MG tablet TAKE 1 TABLET(50 MG) BY MOUTH THREE TIMES DAILY Patient taking differently: Take 50 mg by mouth 3 (three) times daily. 12/09/21   Yates Decamp, MD  latanoprost (XALATAN) 0.005 % ophthalmic solution Place 1 drop into both eyes at bedtime. Patient not taking: Reported on 09/26/2022    [provider]  levothyroxine (SYNTHROID, LEVOTHROID) 150 MCG tablet Take 150 mcg by mouth daily. 12/10/14   [provider]  Magnesium Oxide 400 MG CAPS Take 1 capsule by mouth daily. 09/19/22   [provider]  metFORMIN (GLUCOPHAGE) 1000 MG tablet Take 500 mg by mouth 2 (two) times daily with a meal.     [provider]  Metoprolol Tartrate 75 MG TABS Take 75 mg by mouth 2 (two) times daily.     [provider]  ondansetron (ZOFRAN) 8 MG tablet Take 1 tablet (8 mg total) by mouth every 8 (eight) hours as needed for nausea or vomiting. 10/05/22   Johney Maine, MD  oxyCODONE (OXY IR/ROXICODONE) 5 MG immediate release tablet Take 2.5 mg by mouth 4 (four) times daily as needed for moderate pain, severe pain  or breakthrough pain. 09/20/22   [provider]  polyethylene glycol (MIRALAX) 17 g packet Take 17 g by mouth daily. 10/05/22   Johney Maine, MD  Rosuvastatin Calcium 20 MG CPSP Take 20 mg by mouth daily.     [provider]  tamsulosin (FLOMAX) 0.4 MG CAPS capsule Take 0.4 mg by mouth daily. 08/22/22   [provider]      Allergies    Hctz [hydrochlorothiazide]    Review of Systems   Review of Systems  Constitutional:  Positive for activity change, appetite change and fatigue. Negative for fever.  HENT:  Negative for congestion.   Respiratory:  Negative for cough, chest tightness and shortness of breath.   Cardiovascular:  Negative for chest pain.   Gastrointestinal:  Negative for abdominal pain, diarrhea, nausea and vomiting.  Genitourinary:  Negative for decreased urine volume and dysuria.  Musculoskeletal:  Positive for arthralgias and myalgias.  Skin:  Negative for rash.  Neurological:  Negative for weakness and headaches.   all other systems are negative except as noted in the HPI and PMH.    Physical Exam Updated Vital Signs BP 126/69 (BP Location: Right Arm)   Pulse (!) 126   Temp (!) 97.3 F (36.3 C) (Axillary)   Resp 19   SpO2 100%  Physical Exam Vitals and nursing note reviewed.  Constitutional:      General: He is not in acute distress.    Appearance: He is well-developed.  HENT:     Head: Normocephalic and atraumatic.     Mouth/Throat:     Mouth: Mucous membranes are dry.     Pharynx: No oropharyngeal exudate.  Eyes:     Conjunctiva/sclera: Conjunctivae normal.     Pupils: Pupils are equal, round, and reactive to light.  Neck:     Comments: No meningismus. Cardiovascular:     Rate and Rhythm: Regular rhythm. Tachycardia present.     Heart sounds: Normal heart sounds. No murmur heard.    Comments: Tachycardic 130s Pulmonary:     Effort: Pulmonary effort is normal. No respiratory distress.     Breath sounds: Normal breath sounds.  Abdominal:     Palpations: Abdomen is soft.     Tenderness: There is no abdominal tenderness. There is no guarding or rebound.  Musculoskeletal:        General: No tenderness. Normal range of motion.     Cervical back: Normal range of motion and neck supple.     Comments: No T or L-spine tenderness, full range of motion hips bilaterally  Skin:    General: Skin is warm.  Neurological:     Mental Status: He is alert.     Cranial Nerves: No cranial nerve deficit.     Motor: No abnormal muscle tone.     Coordination: Coordination normal.     Comments: Oriented to person only.  Moves all extremities equally, 5/5 strength throughout.  Psychiatric:        Behavior: Behavior  normal.     ED Results / Procedures / Treatments   Labs (all labs ordered are listed, but only abnormal results are displayed) Labs Reviewed  COMPREHENSIVE METABOLIC PANEL - Abnormal; Notable for the following components:      Result Value   Sodium 128 (*)    Potassium 6.5 (*)    CO2 <7 (*)    Glucose, Bld 243 (*)    BUN 139 (*)    Creatinine, Ser 11.76 (*)  Total Protein >12.0 (*)    Albumin 2.6 (*)    GFR, Estimated 4 (*)    All other components within normal limits  BLOOD GAS, VENOUS - Abnormal; Notable for the following components:   pH, Ven 7.21 (*)    pCO2, Ven 21 (*)    pO2, Ven 51 (*)    Bicarbonate 8.4 (*)    Acid-base deficit 17.5 (*)    All other components within normal limits  PROTIME-INR - Abnormal; Notable for the following components:   Prothrombin Time 18.1 (*)    INR 1.5 (*)    All other components within normal limits  APTT - Abnormal; Notable for the following components:   aPTT 23 (*)    All other components within normal limits  CBG MONITORING, ED - Abnormal; Notable for the following components:   Glucose-Capillary 167 (*)    All other components within normal limits  I-STAT CG4 LACTIC ACID, ED - Abnormal; Notable for the following components:   Lactic Acid, Venous 6.0 (*)    All other components within normal limits  TROPONIN I (HIGH SENSITIVITY) - Abnormal; Notable for the following components:   Troponin I (High Sensitivity) 40 (*)    All other components within normal limits  RESP PANEL BY RT-PCR (RSV, FLU A&B, COVID)  RVPGX2  CULTURE, BLOOD (ROUTINE X 2)  CULTURE, BLOOD (ROUTINE X 2)  CK  URINALYSIS, ROUTINE W REFLEX MICROSCOPIC  CBC WITH DIFFERENTIAL/PLATELET  URINALYSIS, W/ REFLEX TO CULTURE (INFECTION SUSPECTED)  CBC WITH DIFFERENTIAL/PLATELET  CBC WITH DIFFERENTIAL/PLATELET  I-STAT CG4 LACTIC ACID, ED  POC OCCULT BLOOD, ED  I-STAT CHEM 8, ED  I-STAT CG4 LACTIC ACID, ED  TYPE AND SCREEN  PREPARE RBC (CROSSMATCH)  TROPONIN I  (HIGH SENSITIVITY)    EKG EKG Interpretation Date/Time:  Monday December 04 2022 04:20:40 EDT Ventricular Rate:  120 PR Interval:  168 QRS Duration:  95 QT Interval:  303 QTC Calculation: 429 R Axis:   34  Text Interpretation: Sinus tachycardia Repol abnrm suggests ischemia, anterolateral Nonspecific T wave abnormality Confirmed by Glynn Octave [phone]) on 12/04/2022 5:15:22 AM  Radiology DG Chest Portable 1 View  Result Date: 12/04/2022 CLINICAL DATA:  Status post fall. EXAM: PORTABLE CHEST 1 VIEW COMPARISON:  PET-CT 10/06/2022 FINDINGS: Heart size and mediastinal contours are unremarkable. No pleural fluid or interstitial edema. No airspace consolidation. 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 right upper lobe lung mass is again seen measuring approximately 4.4 cm on today's study. Again seen are extensive, multifocal lytic bone metastases which involve bilateral ribs. Age indeterminate pathologic fractures involving the lateral aspect of the right fourth rib and left posterior sixth rib noted. IMPRESSION: 1. No acute cardiopulmonary disease. 2. Right upper lobe lung mass. 3. Extensive lytic bone metastases. Age indeterminate pathologic fractures involving the lateral aspect of the right fourth rib and left posterior sixth rib. Electronically Signed   By: Signa Kell M.D.   On: 12/04/2022 06:06    Procedures .Critical Care  Performed by: Glynn Octave, MD Authorized by: Glynn Octave, MD   Critical care provider statement:    Critical care time (minutes):  60   Critical care time was exclusive of:  Separately billable procedures and treating other patients   Critical care was necessary to treat or prevent imminent or life-threatening deterioration of the following conditions:  Respiratory failure, shock, metabolic crisis, endocrine crisis and dehydration   Critical care was time spent personally by me on the following activities:  Development of  treatment plan with patient or  surrogate, discussions with consultants, evaluation of patient's response to treatment, examination of patient, ordering and review of laboratory studies, ordering and review of radiographic studies, ordering and performing treatments and interventions, pulse oximetry, re-evaluation of patient's condition, review of old charts, blood draw for specimens and obtaining history from patient or surrogate   I assumed direction of critical care for this patient from another provider in my specialty: no       Medications Ordered in ED Medications  lactated ringers bolus 1,000 mL (has no administration in time range)    ED Course/ Medical Decision Making/ A&P                             Medical Decision Making Amount and/or Complexity of Data Reviewed Independent Historian: EMS Labs: ordered. Decision-making details documented in ED Course. Radiology: ordered and independent interpretation performed. Decision-making details documented in ED Course. ECG/medicine tests: ordered and independent interpretation performed. Decision-making details documented in ED Course.  Risk OTC drugs. Prescription drug management.   6 underlies weakness with fall and back pain.  Tachycardic for EMS complaining of dry mouth.  Blood pressure stable, no fever.  Patient tachycardic on arrival with very dry mucous membranes.  Rectal temperature 96.7.  Tachycardic to the 120s.  Denies any pain but just complains of dry mouth.  He is a poor historian.  Attempted to call patient's wife without answer.  Will initiate IV fluids, infectious workup pursued.  He is started on broad-spectrum antibiotics as well as IV fluids.  Concern for possible sepsis with his hypothermia and tachycardia.  Blood pressure stable.  Lactic acid is 6.0. Workup remarkable for metabolic acidosis, acute renal failure, hyperkalemia of 6.5.  No acute EKG changes.  Creatinine 12.0.  Patient was initiated hyperkalemia protocol including IV calcium,  bicarb, Lokelma, Lasix, dextrose and insulin. Does not appear to be in DKA.  Will need admission due to severe dehydration and electrolyte abnormalities and hyperkalemia and renal failure. Also with hemoglobin 4.2 baseline is around 8.  He denies any black or bloody stools.  Hemoccult is negative.  Emergent blood transfusion ordered as well. Continue IV bicarb infusion.  Nephrology consult pending, critical care consult pending.  Traumatic imaging pending at shift change.  Awaiting callback from critical care as well as nephrology.  Presumed full code at this time.  No family available. Dr. Renaye Rakers to assume care.           Final Clinical Impression(s) / ED Diagnoses Final diagnoses:  Lactic acidosis  Hyperkalemia  Acute renal failure, unspecified acute renal failure type (HCC)  Symptomatic anemia    Rx / DC Orders ED Discharge Orders     None         Oluwademilade Kellett, Jeannett Senior, MD 12/04/22 [phone]

## 2022-12-04 NOTE — ED Notes (Signed)
ED TO INPATIENT HANDOFF REPORT  ED Nurse Name and Phone #: Lona Kettle Name/Age/Gender Scott Bass 72 y.o. male Room/Bed: WA14/WA14  Code Status   Code Status: Full Code  Home/SNF/Other Home Patient oriented to: self and place Is this baseline? No   Triage Complete: Triage complete  Chief Complaint Acute on chronic renal failure (HCC) [N17.9, N18.9]  Triage Note No notes on file   Allergies Allergies  Allergen Reactions   Hctz [Hydrochlorothiazide] Other (See Comments)    Pancreatitis     Level of Care/Admitting Diagnosis ED Disposition     ED Disposition  Admit   Condition  --   Comment  Hospital Area: [facility] [[id number]]  Level of Care: ICU [6]  May admit patient to [facility] or [facility] if equivalent level of care is available:: No  Covid Evaluation: Asymptomatic - no recent exposure (last 10 days) testing not required  Diagnosis: Acute on chronic renal failure [facility]) [[id number]]  Admitting Physician: Luciano Cutter [[id number]]  Attending Physician: Luciano Cutter [phone]  Certification:: I certify this patient will need inpatient services for at least 2 midnights  Estimated Length of Stay: 5          B Medical/Surgery History Past Medical History:  Diagnosis Date   Arthritis    "lower back" (12/18/2017)   Better eye: moderate vision impairment; lesser eye: blind    GERD (gastroesophageal reflux disease)    Glaucoma, both eyes    High cholesterol    Hypertension    Hypothyroidism    Irregular heartbeat    Legally blind    "both eyes; can see some out of left eye"   OSA (obstructive sleep apnea)    Thyroid disease    Type II diabetes mellitus (HCC)    Vitamin B12 deficiency    Past Surgical History:  Procedure Laterality Date   LEFT HEART CATH AND CORONARY ANGIOGRAPHY N/A 12/18/2017   Procedure: LEFT HEART CATH AND CORONARY ANGIOGRAPHY;  Surgeon: Elder Negus, MD;  Location: [facility];  Service:  Cardiovascular;  Laterality: N/A;   NOSE SURGERY     "was crooked; they straightened it out" (12/18/2017)     A IV Location/Drains/Wounds Patient Lines/Drains/Airways Status     Active Line/Drains/Airways     Name Placement date Placement time Site Days   Peripheral IV 12/04/22 20 G Anterior;Right;Upper Arm 12/04/22  0511  Arm  less than 1   Peripheral IV 12/04/22 20 G Anterior;Distal;Left;Upper Arm 12/04/22  0639  Arm  less than 1   Wound / Incision (Open or Dehisced) 09/29/22 Puncture Vertebral column Lower;Mid bone marrow biopsy 09/29/22  0913  Vertebral column  66            Intake/Output Last 24 hours No intake or output data in the 24 hours ending 12/04/22 1012  Labs/Imaging Results for orders placed or performed during the hospital encounter of 12/04/22 (from the past 48 hour(s))  Comprehensive metabolic panel     Status: Abnormal   Collection Time: 12/04/22  5:15 AM  Result Value Ref Range   Sodium 128 (L) 135 - 145 mmol/L    Comment: ELECTROLYTES REPEATED TO VERIFY   Potassium 6.5 (HH) 3.5 - 5.1 mmol/L    Comment: ELECTROLYTES REPEATED TO VERIFY CRITICAL RESULT CALLED TO, READ BACK BY AND VERIFIED WITH PERRY,A. RN AT 5573 12/04/22 MULLINS,T    Chloride 104 98 - 111 mmol/L    Comment: ELECTROLYTES REPEATED TO VERIFY  CO2 <7 (L) 22 - 32 mmol/L    Comment: ELECTROLYTES REPEATED TO VERIFY   Glucose, Bld 243 (H) 70 - 99 mg/dL    Comment: Glucose reference range applies only to samples taken after fasting for at least 8 hours.   BUN 139 (H) 8 - 23 mg/dL    Comment: RESULT CONFIRMED BY MANUAL DILUTION   Creatinine, Ser 11.76 (H) 0.61 - 1.24 mg/dL   Calcium 9.5 8.9 - 95.6 mg/dL   Total Protein >21.3 (H) 6.5 - 8.1 g/dL    Comment: REPEATED TO VERIFY   Albumin 2.6 (L) 3.5 - 5.0 g/dL   AST 35 15 - 41 U/L   ALT 28 0 - 44 U/L    Comment: RESULT CONFIRMED BY MANUAL DILUTION   Alkaline Phosphatase 62 38 - 126 U/L   Total Bilirubin 1.0 0.3 - 1.2 mg/dL   GFR, Estimated  4 (L) >60 mL/min    Comment: (NOTE) Calculated using the CKD-EPI Creatinine Equation (2021)    Anion gap NOT CALCULATED 5 - 15    Comment: ELECTROLYTES REPEATED TO VERIFY Performed at [facility], [address], [place], [place] [address]   Troponin I (High Sensitivity)     Status: Abnormal   Collection Time: 12/04/22  5:15 AM  Result Value Ref Range   Troponin I (High Sensitivity) 40 (H) <18 ng/L    Comment: (NOTE) Elevated high sensitivity troponin I (hsTnI) values and significant  changes across serial measurements may suggest ACS but many other  chronic and acute conditions are known to elevate hsTnI results.  Refer to the "Links" section for chest pain algorithms and additional  guidance. Performed at [facility], [address], [place], [place] [address]   CK     Status: None   Collection Time: 12/04/22  5:15 AM  Result Value Ref Range   Total CK 66 49 - 397 U/L    Comment: Performed at [facility], [address], [place], [place] [address]  Blood gas, venous (at [facility] and [facility])     Status: Abnormal   Collection Time: 12/04/22  5:15 AM  Result Value Ref Range   pH, Ven 7.21 (L) 7.25 - 7.43   pCO2, Ven 21 (L) 44 - 60 mmHg   pO2, Ven 51 (H) 32 - 45 mmHg   Bicarbonate 8.4 (L) 20.0 - 28.0 mmol/L   Acid-base deficit 17.5 (H) 0.0 - 2.0 mmol/L   O2 Saturation 78.6 %   Patient temperature 37.0     Comment: Performed at [facility], [address], [place], [place] [address]  Protime-INR     Status: Abnormal   Collection Time: 12/04/22  5:15 AM  Result Value Ref Range   Prothrombin Time 18.1 (H) 11.4 - 15.2 seconds   INR 1.5 (H) 0.8 - 1.2    Comment: (NOTE) INR goal varies based on device and disease states. Performed at [facility], [address] [place], [place] [address]   APTT     Status: Abnormal   Collection Time: 12/04/22  5:15 AM  Result Value Ref Range   aPTT 23  (L) 24 - 36 seconds    Comment: Performed at [facility], [address], [place], [place] [address]  I-Stat CG4 Lactic Acid     Status: Abnormal   Collection Time: 12/04/22  5:29 AM  Result Value Ref Range   Lactic Acid, Venous 6.0 (HH) 0.5 - 1.9  mmol/L   Comment NOTIFIED PHYSICIAN   CBG monitoring, ED     Status: Abnormal   Collection Time: 12/04/22  5:42 AM  Result Value Ref Range   Glucose-Capillary 167 (H) 70 - 99 mg/dL    Comment: Glucose reference range applies only to samples taken after fasting for at least 8 hours.  Type and screen     Status: None (Preliminary result)   Collection Time: 12/04/22  6:30 AM  Result Value Ref Range   ABO/RH(D) B POS    Antibody Screen NEG    Sample Expiration      12/07/2022,2359 Performed at [facility], [address], [place], [place] [address]    Unit Number [id number]    Blood Component Type RED CELLS,LR    Unit division 00    Status of Unit ISSUED    Transfusion Status OK TO TRANSFUSE    Crossmatch Result Compatible    Unit Number [id number]    Blood Component Type RED CELLS,LR    Unit division 00    Status of Unit ALLOCATED    Transfusion Status OK TO TRANSFUSE    Crossmatch Result Compatible   Prepare RBC     Status: None   Collection Time: 12/04/22  7:19 AM  Result Value Ref Range   Order Confirmation      ORDER PROCESSED BY BLOOD BANK Performed at [facility], [address], [place], [place] [address]   Resp panel by RT-PCR (RSV, Flu A&B, Covid) Anterior Nasal Swab     Status: None   Collection Time: 12/04/22  8:15 AM   Specimen: Anterior Nasal Swab  Result Value Ref Range   SARS Coronavirus 2 by RT PCR NEGATIVE NEGATIVE    Comment: (NOTE) SARS-CoV-2 target nucleic acids are NOT DETECTED.  The SARS-CoV-2 RNA is generally detectable in upper respiratory specimens during the acute phase of infection. The lowest concentration of SARS-CoV-2 viral copies  this assay can detect is 138 copies/mL. A negative result does not preclude SARS-Cov-2 infection and should not be used as the sole basis for treatment or other patient management decisions. A negative result may occur with  improper specimen collection/handling, submission of specimen other than nasopharyngeal swab, presence of viral mutation(s) within the areas targeted by this assay, and inadequate number of viral copies(<138 copies/mL). A negative result must be combined with clinical observations, patient history, and epidemiological information. The expected result is Negative.  Fact Sheet for Patients:  BloggerCourse.com  Fact Sheet for Healthcare Providers:  SeriousBroker.it  This test is no t yet approved or cleared by the [place] FDA and  has been authorized for detection and/or diagnosis of SARS-CoV-2 by FDA under an Emergency Use Authorization (EUA). This EUA will remain  in effect (meaning this test can be used) for the duration of the COVID-19 declaration under Section 564(b)(1) of the Act, 21 U.S.C.section 360bbb-3(b)(1), unless the authorization is terminated  or revoked sooner.       Influenza A by PCR NEGATIVE NEGATIVE   Influenza B by PCR NEGATIVE NEGATIVE    Comment: (NOTE) The Xpert Xpress SARS-CoV-2/FLU/RSV plus assay is intended as an aid in the diagnosis of influenza from Nasopharyngeal swab specimens and should not be used as a sole basis for treatment. Nasal washings and aspirates are unacceptable for Xpert Xpress SARS-CoV-2/FLU/RSV testing.  Fact Sheet for Patients: BloggerCourse.com  Fact Sheet for Healthcare Providers: SeriousBroker.it  This test is not yet approved or cleared by the [place]  FDA and has been authorized for detection and/or diagnosis of SARS-CoV-2 by FDA under an Emergency Use Authorization (EUA). This EUA will  remain in effect (meaning this test can be used) for the duration of the COVID-19 declaration under Section 564(b)(1) of the Act, 21 U.S.C. section 360bbb-3(b)(1), unless the authorization is terminated or revoked.     Resp Syncytial Virus by PCR NEGATIVE NEGATIVE    Comment: (NOTE) Fact Sheet for Patients: BloggerCourse.com  Fact Sheet for Healthcare Providers: SeriousBroker.it  This test is not yet approved or cleared by the [place] FDA and has been authorized for detection and/or diagnosis of SARS-CoV-2 by FDA under an Emergency Use Authorization (EUA). This EUA will remain in effect (meaning this test can be used) for the duration of the COVID-19 declaration under Section 564(b)(1) of the Act, 21 U.S.C. section 360bbb-3(b)(1), unless the authorization is terminated or revoked.  Performed at [facility], [address] [place], [place] [address]   Troponin I (High Sensitivity)     Status: Abnormal   Collection Time: 12/04/22  8:15 AM  Result Value Ref Range   Troponin I (High Sensitivity) 47 (H) <18 ng/L    Comment: (NOTE) Elevated high sensitivity troponin I (hsTnI) values and significant  changes across serial measurements may suggest ACS but many other  chronic and acute conditions are known to elevate hsTnI results.  Refer to the "Links" section for chest pain algorithms and additional  guidance. Performed at [facility], [address] [place], [place] [address]   I-Stat Lactic Acid, ED     Status: Abnormal   Collection Time: 12/04/22  8:19 AM  Result Value Ref Range   Lactic Acid, Venous 2.7 (HH) 0.5 - 1.9 mmol/L   Comment NOTIFIED PHYSICIAN   CBC with Differential     Status: Abnormal   Collection Time: 12/04/22  8:27 AM  Result Value Ref Range   WBC 4.4 4.0 - 10.5 K/uL   RBC 1.19 (L) 4.22 - 5.81 MIL/uL   Hemoglobin 3.5 (LL) 13.0 - 17.0 g/dL    Comment: This  critical result has verified and been called to [facility]. RN by Patrina Levering on 07 29 2024 at 0913, and has been read back. RESULTS CONFIRMED VIA RECOLLECT. RESULT CHECKED.   HCT 11.8 (L) 39.0 - 52.0 %   MCV 99.2 80.0 - 100.0 fL   MCH 29.4 26.0 - 34.0 pg   MCHC 29.7 (L) 30.0 - 36.0 g/dL   RDW 09.8 (H) 11.9 - 14.7 %   Platelets 131 (L) 150 - 400 K/uL   nRBC 1.8 (H) 0.0 - 0.2 %   Neutrophils Relative % 64 %   Neutro Abs 2.9 1.7 - 7.7 K/uL   Lymphocytes Relative 24 %   Lymphs Abs 1.1 0.7 - 4.0 K/uL   Monocytes Relative 8 %   Monocytes Absolute 0.3 0.1 - 1.0 K/uL   Eosinophils Relative 1 %   Eosinophils Absolute 0.0 0.0 - 0.5 K/uL   Basophils Relative 0 %   Basophils Absolute 0.0 0.0 - 0.1 K/uL   Immature Granulocytes 3 %   Abs Immature Granulocytes 0.11 (H) 0.00 - 0.07 K/uL   Rouleaux PRESENT    Polychromasia PRESENT     Comment: Performed at [facility], [address], [place], [place] [address]  CBC with Differential/Platelet     Status: Abnormal   Collection Time: 12/04/22  9:18 AM  Result Value Ref Range   WBC 5.7 4.0 -  10.5 K/uL   RBC 1.21 (L) 4.22 - 5.81 MIL/uL   Hemoglobin 3.5 (LL) 13.0 - 17.0 g/dL    Comment: CRITICAL VALUE NOTED.  VALUE IS CONSISTENT WITH PREVIOUSLY REPORTED AND CALLED VALUE. REPEATED TO VERIFY    HCT 11.7 (L) 39.0 - 52.0 %   MCV 96.7 80.0 - 100.0 fL   MCH 28.9 26.0 - 34.0 pg   MCHC 29.9 (L) 30.0 - 36.0 g/dL   RDW 13.0 (H) 86.5 - 78.4 %   Platelets 137 (L) 150 - 400 K/uL   nRBC 1.6 (H) 0.0 - 0.2 %   Neutrophils Relative % 55 %   Neutro Abs 3.1 1.7 - 7.7 K/uL   Lymphocytes Relative 27 %   Lymphs Abs 1.5 0.7 - 4.0 K/uL   Monocytes Relative 12 %   Monocytes Absolute 0.7 0.1 - 1.0 K/uL   Eosinophils Relative 1 %   Eosinophils Absolute 0.1 0.0 - 0.5 K/uL   Basophils Relative 0 %   Basophils Absolute 0.0 0.0 - 0.1 K/uL   WBC Morphology Mild Left Shift (1-5% metas, occ myelo)    Immature Granulocytes 5 %   Abs Immature  Granulocytes 0.27 (H) 0.00 - 0.07 K/uL   Rouleaux PRESENT    Polychromasia PRESENT     Comment: Performed at [facility], [address] [place], [place] [address]  Basic metabolic panel     Status: Abnormal   Collection Time: 12/04/22  9:18 AM  Result Value Ref Range   Sodium 131 (L) 135 - 145 mmol/L   Potassium 4.7 3.5 - 5.1 mmol/L    Comment: DELTA CHECK NOTED   Chloride 104 98 - 111 mmol/L   CO2 15 (L) 22 - 32 mmol/L   Glucose, Bld 168 (H) 70 - 99 mg/dL    Comment: Glucose reference range applies only to samples taken after fasting for at least 8 hours.   BUN 121 (H) 8 - 23 mg/dL    Comment: RESULT CONFIRMED BY MANUAL DILUTION   Creatinine, Ser 11.06 (H) 0.61 - 1.24 mg/dL   Calcium 9.0 8.9 - 52.8 mg/dL   GFR, Estimated 4 (L) >60 mL/min    Comment: (NOTE) Calculated using the CKD-EPI Creatinine Equation (2021)    Anion gap 12 5 - 15    Comment: Performed at [facility], [address], [place], [place] [address]   CT Chest Wo Contrast  Result Date: 12/04/2022 CLINICAL DATA:  Blunt chest trauma. EXAM: CT CHEST, ABDOMEN AND PELVIS WITHOUT CONTRAST TECHNIQUE: Multidetector CT imaging of the chest, abdomen and pelvis was performed following the standard protocol without IV contrast. RADIATION DOSE REDUCTION: This exam was performed according to the departmental dose-optimization program which includes automated exposure control, adjustment of the mA and/or kV according to patient size and/or use of iterative reconstruction technique. COMPARISON:  Chest CT dated 09/26/2022. FINDINGS: CT CHEST FINDINGS Cardiovascular: No thoracic aortic aneurysm. No pericardial effusion. Mediastinum/Nodes: No mass or enlarged lymph nodes within the mediastinum. No hemorrhage or edema is seen within the mediastinum. Esophagus is unremarkable. Trachea is unremarkable. Lungs/Pleura: Mild dependent atelectasis bilaterally. Lungs appear otherwise clear. No pleural effusion  or hemothorax. No pneumothorax. Musculoskeletal: Innumerable lytic lesions throughout the thoracic spine and bilateral ribs, corresponding to patient's known multiple myeloma. Associated large expansile lesion within the anterior second RIGHT rib. Multiple associated pathologic rib fractures bilaterally, of uncertain age but likely chronic. Displaced fracture of the lower RIGHT scapula, also likely pathologic related to underlying lytic  lesions (multiple myeloma), of uncertain age. No acute appearing fracture or subluxation within the thoracic spine. Large expansile mass involving the T4 vertebral body with soft tissue component extending into the central canal and LEFT neural foramen, with almost certain mass effect on the thoracic cord and exiting nerve root. CT ABDOMEN PELVIS FINDINGS Hepatobiliary: No focal liver abnormality is seen. Gallbladder is unremarkable. No perihepatic hematoma. No bile duct dilatation is seen. Pancreas: Partially infiltrated with fat but otherwise unremarkable. Spleen: No splenic injury or perisplenic hematoma. Adrenals/Urinary Tract: Adrenal glands appear normal. No adrenal hemorrhage. No renal injury identified. No renal stone or hydronephrosis. No ureteral or bladder calculi are identified. Bladder is unremarkable. Stomach/Bowel: No dilated large or small bowel loops. No evidence of bowel wall inflammation or bowel wall injury. Appendix is normal. Stomach is unremarkable. Vascular/Lymphatic: No abdominal aortic aneurysm. No periaortic hemorrhage. No enlarged lymph nodes are seen in the abdomen or pelvis. Reproductive: Moderate prostate gland enlargement with some associated mass effect on the bladder base. Other: No free fluid or hemorrhage is seen within the abdomen or pelvis. No free intraperitoneal air. Musculoskeletal: Innumerable lytic lesions throughout the lumbar spine and osseous pelvis, corresponding to patient's known multiple myeloma. Questionable mild compression  deformity of the L4 vertebral body, likely pathologic, nonacute and related to the underlying lytic changes of patient's multiple myeloma. No acute-appearing osseous abnormality is seen within the abdomen or pelvis. IMPRESSION: 1. Innumerable lytic lesions throughout the thoracic spine, bilateral ribs, lumbar spine and osseous pelvis, corresponding to patient's known multiple myeloma. 2. Large expansile mass involving the T4 vertebral body with soft tissue component extending into the central canal and LEFT neural foramen, with almost certain mass effect on the thoracic cord and exiting nerve root. Consider MRI of the thoracic spine without and with IV contrast to further evaluate. 3. Multiple associated pathologic rib fractures bilaterally, of uncertain age but likely chronic. 4. Displaced fracture of the lower RIGHT scapula, of uncertain age but also likely pathologic related to underlying lytic lesions (multiple myeloma). 5. Questionable mild compression deformity of the L4 vertebral body, likely pathologic, nonacute and related to the underlying lytic changes of patient's multiple myeloma. 6. No acute findings within the chest, abdomen or pelvis. No evidence of solid organ injury. No hemorrhage or edema within the mediastinum. No pleural effusion or pneumothorax. No evidence of bowel wall injury. 7. Moderate prostate gland enlargement with some associated mass effect on the bladder base. Electronically Signed   By: Bary Richard M.D.   On: 12/04/2022 08:42   CT ABDOMEN PELVIS WO CONTRAST  Result Date: 12/04/2022 CLINICAL DATA:  Blunt chest trauma. EXAM: CT CHEST, ABDOMEN AND PELVIS WITHOUT CONTRAST TECHNIQUE: Multidetector CT imaging of the chest, abdomen and pelvis was performed following the standard protocol without IV contrast. RADIATION DOSE REDUCTION: This exam was performed according to the departmental dose-optimization program which includes automated exposure control, adjustment of the mA and/or kV  according to patient size and/or use of iterative reconstruction technique. COMPARISON:  Chest CT dated 09/26/2022. FINDINGS: CT CHEST FINDINGS Cardiovascular: No thoracic aortic aneurysm. No pericardial effusion. Mediastinum/Nodes: No mass or enlarged lymph nodes within the mediastinum. No hemorrhage or edema is seen within the mediastinum. Esophagus is unremarkable. Trachea is unremarkable. Lungs/Pleura: Mild dependent atelectasis bilaterally. Lungs appear otherwise clear. No pleural effusion or hemothorax. No pneumothorax. Musculoskeletal: Innumerable lytic lesions throughout the thoracic spine and bilateral ribs, corresponding to patient's known multiple myeloma. Associated large expansile lesion within the anterior second RIGHT rib. Multiple  associated pathologic rib fractures bilaterally, of uncertain age but likely chronic. Displaced fracture of the lower RIGHT scapula, also likely pathologic related to underlying lytic lesions (multiple myeloma), of uncertain age. No acute appearing fracture or subluxation within the thoracic spine. Large expansile mass involving the T4 vertebral body with soft tissue component extending into the central canal and LEFT neural foramen, with almost certain mass effect on the thoracic cord and exiting nerve root. CT ABDOMEN PELVIS FINDINGS Hepatobiliary: No focal liver abnormality is seen. Gallbladder is unremarkable. No perihepatic hematoma. No bile duct dilatation is seen. Pancreas: Partially infiltrated with fat but otherwise unremarkable. Spleen: No splenic injury or perisplenic hematoma. Adrenals/Urinary Tract: Adrenal glands appear normal. No adrenal hemorrhage. No renal injury identified. No renal stone or hydronephrosis. No ureteral or bladder calculi are identified. Bladder is unremarkable. Stomach/Bowel: No dilated large or small bowel loops. No evidence of bowel wall inflammation or bowel wall injury. Appendix is normal. Stomach is unremarkable. Vascular/Lymphatic:  No abdominal aortic aneurysm. No periaortic hemorrhage. No enlarged lymph nodes are seen in the abdomen or pelvis. Reproductive: Moderate prostate gland enlargement with some associated mass effect on the bladder base. Other: No free fluid or hemorrhage is seen within the abdomen or pelvis. No free intraperitoneal air. Musculoskeletal: Innumerable lytic lesions throughout the lumbar spine and osseous pelvis, corresponding to patient's known multiple myeloma. Questionable mild compression deformity of the L4 vertebral body, likely pathologic, nonacute and related to the underlying lytic changes of patient's multiple myeloma. No acute-appearing osseous abnormality is seen within the abdomen or pelvis. IMPRESSION: 1. Innumerable lytic lesions throughout the thoracic spine, bilateral ribs, lumbar spine and osseous pelvis, corresponding to patient's known multiple myeloma. 2. Large expansile mass involving the T4 vertebral body with soft tissue component extending into the central canal and LEFT neural foramen, with almost certain mass effect on the thoracic cord and exiting nerve root. Consider MRI of the thoracic spine without and with IV contrast to further evaluate. 3. Multiple associated pathologic rib fractures bilaterally, of uncertain age but likely chronic. 4. Displaced fracture of the lower RIGHT scapula, of uncertain age but also likely pathologic related to underlying lytic lesions (multiple myeloma). 5. Questionable mild compression deformity of the L4 vertebral body, likely pathologic, nonacute and related to the underlying lytic changes of patient's multiple myeloma. 6. No acute findings within the chest, abdomen or pelvis. No evidence of solid organ injury. No hemorrhage or edema within the mediastinum. No pleural effusion or pneumothorax. No evidence of bowel wall injury. 7. Moderate prostate gland enlargement with some associated mass effect on the bladder base. Electronically Signed   By: Bary Richard  M.D.   On: 12/04/2022 08:42   CT L-SPINE NO CHARGE  Result Date: 12/04/2022 CLINICAL DATA:  Fall. EXAM: CT LUMBAR SPINE WITHOUT CONTRAST TECHNIQUE: Multidetector CT imaging of the lumbar spine was performed without intravenous contrast administration. Multiplanar CT image reconstructions were also generated. RADIATION DOSE REDUCTION: This exam was performed according to the departmental dose-optimization program which includes automated exposure control, adjustment of the mA and/or kV according to patient size and/or use of iterative reconstruction technique. COMPARISON:  Plain film of the lumbar spine dated 11/22/2015. FINDINGS: Segmentation: 5 lumbar type vertebrae. Alignment: Stable.  No evidence of acute vertebral body subluxation. Vertebrae: No fracture line or displaced fracture fragment is seen. Questionable mild compression of the L4 vertebral body which is likely nonacute. Innumerable lytic lesions throughout the lumbar spine, corresponding to patient's known multiple myeloma. The mild compression of  the L4 vertebral body is likely related to the underlying lytic changes. Paraspinal and other soft tissues: Visualized immediate paravertebral soft tissues are unremarkable. Disc levels: Disc desiccations at each level of the lumbar spine, with associated disc space narrowings and osteophyte formation, causing moderate to severe central canal stenoses at the L2-3 through L4-5 levels and mild-to-moderate central canal stenosis at L5-S1. Various degrees of neural foramen narrowing at each level of the lumbar spine as well, with possible associated nerve root impingement. IMPRESSION: 1. No acute fracture or subluxation within the lumbar spine. 2. Innumerable lytic lesions throughout the lumbar spine, corresponding to patient's known multiple myeloma. 3. Questionable interval mild compression of the L4 vertebral body which is likely nonacute and related to the underlying lytic lesions (multiple myeloma). 4.  Multilevel degenerative disc disease, causing moderate to severe central canal stenoses at the L2-3 through L4-5 levels and mild-to-moderate central canal stenosis at L5-S1, with possible associated nerve root impingement at 1 or more levels. Electronically Signed   By: Bary Richard M.D.   On: 12/04/2022 08:30   CT Head Wo Contrast  Result Date: 12/04/2022 CLINICAL DATA:  Head trauma, moderate-severe; Neck trauma (Age >= 65y) EXAM: CT HEAD WITHOUT CONTRAST CT CERVICAL SPINE WITHOUT CONTRAST TECHNIQUE: Multidetector CT imaging of the head and cervical spine was performed following the standard protocol without intravenous contrast. Multiplanar CT image reconstructions of the cervical spine were also generated. RADIATION DOSE REDUCTION: This exam was performed according to the departmental dose-optimization program which includes automated exposure control, adjustment of the mA and/or kV according to patient size and/or use of iterative reconstruction technique. COMPARISON:  None Available. FINDINGS: CT HEAD FINDINGS Brain: No hemorrhage. No extra-axial fluid collection. No hydrocephalus. No CT evidence of an acute cortical infarct. Mild chronic microvascular ischemic change. Vascular: No hyperdense vessel or unexpected calcification. Skull: No acute fracture. There are nonspecific scattered lucent lesions throughout the calvarium, largest which is at the vertex (series 7, image 41). Sinuses/Orbits: No middle ear or mastoid effusion. Polypoid mucosal thickening in the right sphenoid left maxillary sinus. Orbits are unremarkable. Other: None. CT CERVICAL SPINE FINDINGS Alignment: Straightening of the normal cervical lordosis. Skull base and vertebrae: There is a mildly displaced fracture through the right C7 transverse process (series 5, image 30). 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 There are lytic lesions throughout the cervical spine involving every cervical vertebral body. Some  of the lesions extend through the posterior cortex, for example at the right posterior C4 endplate (series 7, image 35) in the left aspect of T3 (series 3, image 2). Metastatic lesions are also seen in the bilateral scapula. Soft tissues and spinal canal: There is likely severe spinal canal stenosis at the T3 level secondary to epidural extension of tumor (series 9, image 1). Disc levels:  See above Upper chest: Negative. Other: None IMPRESSION: 1. No acute intracranial abnormality. 2. Mildly displaced fracture through the right C7 transverse process. 3. Multiple lytic lesions throughout the cervical spine, calvarium, and bilateral scapula. Findings could be seen in the setting of diffuse metastatic disease of myeloma. Some of the lesions extend through the posterior cortex, for example at the right posterior C4 endplate and left aspect of T3, with likely severe spinal canal stenosis at the T3 level secondary to epidural extension of tumor. Recommend MRI of the cervical and thoracic spine with and without contrast for further evaluation. Electronically Signed   By: Lorenza Cambridge M.D.   On: 12/04/2022 08:30   CT  Cervical Spine Wo Contrast  Result Date: 12/04/2022 CLINICAL DATA:  Head trauma, moderate-severe; Neck trauma (Age >= 65y) EXAM: CT HEAD WITHOUT CONTRAST CT CERVICAL SPINE WITHOUT CONTRAST TECHNIQUE: Multidetector CT imaging of the head and cervical spine was performed following the standard protocol without intravenous contrast. Multiplanar CT image reconstructions of the cervical spine were also generated. RADIATION DOSE REDUCTION: This exam was performed according to the departmental dose-optimization program which includes automated exposure control, adjustment of the mA and/or kV according to patient size and/or use of iterative reconstruction technique. COMPARISON:  None Available. FINDINGS: CT HEAD FINDINGS Brain: No hemorrhage. No extra-axial fluid collection. No hydrocephalus. No CT evidence of  an acute cortical infarct. Mild chronic microvascular ischemic change. Vascular: No hyperdense vessel or unexpected calcification. Skull: No acute fracture. There are nonspecific scattered lucent lesions throughout the calvarium, largest which is at the vertex (series 7, image 41). Sinuses/Orbits: No middle ear or mastoid effusion. Polypoid mucosal thickening in the right sphenoid left maxillary sinus. Orbits are unremarkable. Other: None. CT CERVICAL SPINE FINDINGS Alignment: Straightening of the normal cervical lordosis. Skull base and vertebrae: There is a mildly displaced fracture through the right C7 transverse process (series 5, image 30). 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 There are lytic lesions throughout the cervical spine involving every cervical vertebral body. Some of the lesions extend through the posterior cortex, for example at the right posterior C4 endplate (series 7, image 35) in the left aspect of T3 (series 3, image 2). Metastatic lesions are also seen in the bilateral scapula. Soft tissues and spinal canal: There is likely severe spinal canal stenosis at the T3 level secondary to epidural extension of tumor (series 9, image 1). Disc levels:  See above Upper chest: Negative. Other: None IMPRESSION: 1. No acute intracranial abnormality. 2. Mildly displaced fracture through the right C7 transverse process. 3. Multiple lytic lesions throughout the cervical spine, calvarium, and bilateral scapula. Findings could be seen in the setting of diffuse metastatic disease of myeloma. Some of the lesions extend through the posterior cortex, for example at the right posterior C4 endplate and left aspect of T3, with likely severe spinal canal stenosis at the T3 level secondary to epidural extension of tumor. Recommend MRI of the cervical and thoracic spine with and without contrast for further evaluation. Electronically Signed   By: Lorenza Cambridge M.D.   On: 12/04/2022 08:30    DG Chest Portable 1 View  Result Date: 12/04/2022 CLINICAL DATA:  Status post fall. EXAM: PORTABLE CHEST 1 VIEW COMPARISON:  PET-CT 10/06/2022 FINDINGS: Heart size and mediastinal contours are unremarkable. No pleural fluid or interstitial edema. No airspace consolidation. 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 right upper lobe lung mass is again seen measuring approximately 4.4 cm on today's study. Again seen are extensive, multifocal lytic bone metastases which involve bilateral ribs. Age indeterminate pathologic fractures involving the lateral aspect of the right fourth rib and left posterior sixth rib noted. IMPRESSION: 1. No acute cardiopulmonary disease. 2. Right upper lobe lung mass. 3. Extensive lytic bone metastases. Age indeterminate pathologic fractures involving the lateral aspect of the right fourth rib and left posterior sixth rib. Electronically Signed   By: Signa Kell M.D.   On: 12/04/2022 06:06    Pending Labs Unresulted Labs (From admission, onward)     Start     Ordered   12/05/22 0500  CBC  Tomorrow morning,   R        12/04/22 6578  12/05/22 0500  Magnesium  Tomorrow morning,   R        12/04/22 0951   12/04/22 1200  Basic metabolic panel  Every 6 hours,   R (with TIMED occurrences)      12/04/22 0951   12/04/22 0951  Procalcitonin  Once,   R       References:    Procalcitonin Lower Respiratory Tract Infection AND Sepsis Procalcitonin Algorithm   12/04/22 0951   12/04/22 0625  CBC with Differential/Platelet  Once,   STAT        12/04/22 0625   12/04/22 0557  Blood Culture (routine x 2)  (Septic presentation on arrival (screening labs, nursing and treatment orders for obvious sepsis))  BLOOD CULTURE X 2,   STAT      12/04/22 0557   12/04/22 0557  Urinalysis, w/ Reflex to Culture (Infection Suspected) -Urine, Clean Catch  (Septic presentation on arrival (screening labs, nursing and treatment orders for obvious sepsis))  ONCE - URGENT,   URGENT       Question:   Specimen Source  Answer:  Urine, Clean Catch   12/04/22 0557   12/04/22 0508  CBC with Differential  Once,   STAT        12/04/22 0508            Vitals/Pain Today's Vitals   12/04/22 0947 12/04/22 0956 12/04/22 1000 12/04/22 1010  BP:  131/70 (!) 146/72 (!) 153/69  Pulse:  (!) 117 (!) 120 (!) 117  Resp:  18 (!) 23 19  Temp: 98.1 F (36.7 C) 98.1 F (36.7 C)  98.1 F (36.7 C)  TempSrc: Oral Oral  Oral  SpO2:   100% 100%    Isolation Precautions No active isolations  Medications Medications  sodium bicarbonate 150 mEq in dextrose 5 % 1,150 mL infusion (has no administration in time range)  levothyroxine (SYNTHROID) tablet 150 mcg (has no administration in time range)  ondansetron (ZOFRAN) tablet 8 mg (has no administration in time range)  polyethylene glycol (MIRALAX / GLYCOLAX) packet 17 g (has no administration in time range)  tamsulosin (FLOMAX) capsule 0.4 mg (has no administration in time range)  ferrous sulfate tablet 325 mg (has no administration in time range)  brimonidine (ALPHAGAN) 0.2 % ophthalmic solution 1 drop (has no administration in time range)  latanoprost (XALATAN) 0.005 % ophthalmic solution 1 drop (has no administration in time range)  docusate sodium (COLACE) capsule 100 mg (has no administration in time range)  polyethylene glycol (MIRALAX / GLYCOLAX) packet 17 g (has no administration in time range)  lactated ringers bolus 1,000 mL (0 mLs Intravenous Stopped 12/04/22 0726)  lactated ringers bolus 1,000 mL (0 mLs Intravenous Stopped 12/04/22 0940)  lactated ringers bolus 1,000 mL (0 mLs Intravenous Stopped 12/04/22 0829)  ceFEPIme (MAXIPIME) 2 g in sodium chloride 0.9 % 100 mL IVPB (0 g Intravenous Stopped 12/04/22 0726)  metroNIDAZOLE (FLAGYL) IVPB 500 mg (0 mg Intravenous Stopped 12/04/22 0829)  vancomycin (VANCOCIN) IVPB 1000 mg/200 mL premix (0 mg Intravenous Stopped 12/04/22 0918)  furosemide (LASIX) injection 40 mg (40 mg Intravenous Given 12/04/22  0748)  insulin aspart (novoLOG) injection 5 Units (5 Units Intravenous Given 12/04/22 0748)    And  dextrose 50 % solution 50 mL (50 mLs Intravenous Given 12/04/22 0748)  calcium gluconate 1 g/ 50 mL sodium chloride IVPB (0 mg Intravenous Stopped 12/04/22 0808)  sodium zirconium cyclosilicate (LOKELMA) packet 10 g (10 g Oral Given 12/04/22 0749)  sodium bicarbonate  injection 50 mEq (50 mEq Intravenous Given 12/04/22 0748)  sodium bicarbonate 150 mEq in dextrose 5 % 1,150 mL infusion ( Intravenous Stopped 12/04/22 1000)  0.9 %  sodium chloride infusion (Manually program via Guardrails IV Fluids) ( Intravenous New Bag/Given 12/04/22 0959)    Mobility non-ambulatory     Focused Assessments Anemia, AKI, Hyperkalemia   R Recommendations: See Admitting Provider Note  Report given to:   Additional Notes: .

## 2022-12-04 NOTE — Progress Notes (Signed)
Still multiple complaints Agitated. Confused Plan Added gaviscon  PRN haldol  Cont supportive care I worry w/ the delirium how this will be affected by systemic steroids should be start them   Simonne Martinet ACNP-BC [facility] Pulmonary/Critical Care Pager # [phone] OR # [phone] if no answer

## 2022-12-04 NOTE — ED Notes (Signed)
Multiple warm blankets applied to pt.

## 2022-12-05 DIAGNOSIS — E875 Hyperkalemia: Secondary | ICD-10-CM | POA: Diagnosis not present

## 2022-12-05 DIAGNOSIS — N179 Acute kidney failure, unspecified: Secondary | ICD-10-CM | POA: Diagnosis not present

## 2022-12-05 DIAGNOSIS — D649 Anemia, unspecified: Secondary | ICD-10-CM | POA: Diagnosis not present

## 2022-12-05 DIAGNOSIS — Z515 Encounter for palliative care: Secondary | ICD-10-CM

## 2022-12-05 DIAGNOSIS — Z7189 Other specified counseling: Secondary | ICD-10-CM

## 2022-12-05 DIAGNOSIS — E872 Acidosis, unspecified: Secondary | ICD-10-CM | POA: Diagnosis not present

## 2022-12-05 DIAGNOSIS — C9000 Multiple myeloma not having achieved remission: Secondary | ICD-10-CM | POA: Diagnosis not present

## 2022-12-05 LAB — BLOOD GAS, ARTERIAL
Acid-Base Excess: 10.1 mmol/L — ABNORMAL HIGH (ref 0.0–2.0)
Bicarbonate: 33.1 mmol/L — ABNORMAL HIGH (ref 20.0–28.0)
Drawn by: 25770
FIO2: 21 %
O2 Saturation: 100 %
Patient temperature: 36.7
pCO2 arterial: 36 mmHg (ref 32–48)
pH, Arterial: 7.57 — ABNORMAL HIGH (ref 7.35–7.45)
pO2, Arterial: 88 mmHg (ref 83–108)

## 2022-12-05 LAB — GLUCOSE, CAPILLARY
Glucose-Capillary: 135 mg/dL — ABNORMAL HIGH (ref 70–99)
Glucose-Capillary: 150 mg/dL — ABNORMAL HIGH (ref 70–99)
Glucose-Capillary: 171 mg/dL — ABNORMAL HIGH (ref 70–99)
Glucose-Capillary: 179 mg/dL — ABNORMAL HIGH (ref 70–99)
Glucose-Capillary: 83 mg/dL (ref 70–99)

## 2022-12-05 LAB — PROCALCITONIN: Procalcitonin: 5.01 ng/mL

## 2022-12-05 LAB — BASIC METABOLIC PANEL
Anion gap: 13 (ref 5–15)
BUN: 103 mg/dL — ABNORMAL HIGH (ref 8–23)
CO2: 26 mmol/L (ref 22–32)
Calcium: 7.6 mg/dL — ABNORMAL LOW (ref 8.9–10.3)
Chloride: 97 mmol/L — ABNORMAL LOW (ref 98–111)
Creatinine, Ser: 8.48 mg/dL — ABNORMAL HIGH (ref 0.61–1.24)
GFR, Estimated: 6 mL/min — ABNORMAL LOW (ref >60)
Glucose, Bld: 135 mg/dL — ABNORMAL HIGH (ref 70–99)
Potassium: 4.7 mmol/L (ref 3.5–5.1)
Sodium: 136 mmol/L (ref 135–145)

## 2022-12-05 MED ORDER — LORAZEPAM 2 MG/ML IJ SOLN
2.0000 mg | INTRAMUSCULAR | Status: DC | PRN
  Administered 2022-12-05: 2 mg via INTRAVENOUS
  Filled 2022-12-05: qty 1

## 2022-12-05 MED ORDER — LACTATED RINGERS IV SOLN: INTRAVENOUS | Status: DC

## 2022-12-05 MED ORDER — OLANZAPINE 10 MG IM SOLR
2.5000 mg | Freq: Four times a day (QID) | INTRAMUSCULAR | Status: DC | PRN
  Administered 2022-12-06 (×2): 2.5 mg via INTRAMUSCULAR
  Filled 2022-12-05 (×4): qty 10

## 2022-12-05 MED ORDER — DEXAMETHASONE 6 MG PO TABS
20.0000 mg | ORAL_TABLET | Freq: Every day | ORAL | Status: AC
  Administered 2022-12-05 – 2022-12-08 (×3): 20 mg via ORAL
  Filled 2022-12-05 (×4): qty 1

## 2022-12-05 MED ORDER — HALOPERIDOL LACTATE 5 MG/ML IJ SOLN
2.0000 mg | Freq: Once | INTRAMUSCULAR | Status: AC
  Administered 2022-12-05: 2 mg via INTRAVENOUS

## 2022-12-05 MED ORDER — PANTOPRAZOLE SODIUM 40 MG IV SOLR
40.0000 mg | Freq: Two times a day (BID) | INTRAVENOUS | Status: DC
  Administered 2022-12-05 – 2022-12-08 (×7): 40 mg via INTRAVENOUS
  Filled 2022-12-05 (×7): qty 10

## 2022-12-05 NOTE — Progress Notes (Signed)
More awake Added the decadron as suggested

## 2022-12-05 NOTE — Progress Notes (Signed)
[facility] Progress Note   Subjective:   agitation noted overnight - haldol given then ativan.  This AM he's sleepy.  Oncology planning treatment for his myeloma - decadron and velcade.  UOP 1.15L yesterday.  No family bedside - wife not reachable at 2 phone numbers available in chart   Objective Vitals:   12/05/22 0717 12/05/22 0800 12/05/22 0900 12/05/22 1000  BP:  (!) 163/69 (!) 166/66 (!) 143/105  Pulse:  (!) 101 (!) 111 (!) 125  Resp:  19 16 (!) 22  Temp: (!) 97.4 F (36.3 C)     TempSrc: Axillary     SpO2:  98% 91% 95%  Weight:      Height:       Physical Exam Gen:  awake and alert in no distress  Eyes: wave hand acuity ENT: MMM CV:  tachycardic without rub Abd:  soft, nontender Lungs: clear on RA GU: no foley Extr:  no edema Neuro: oriented to [place], self but otherwise mumbling incoherent answers Skin: warm and dry  Additional Objective Labs: Basic Metabolic Panel: Recent Labs  Lab 12/04/22 1635 12/05/22 0100 12/05/22 0655  NA 131* 132* 132*  K 4.2 3.9 3.9  CL 101 98 96*  CO2 16* 22 22  GLUCOSE 197* 214* 154*  BUN 116* 113* 117*  CREATININE 10.40* 9.52* 9.54*  CALCIUM 8.6* 8.2* 8.2*   Liver Function Tests: Recent Labs  Lab 12/04/22 0515  AST 35  ALT 28  ALKPHOS 62  BILITOT 1.0  PROT >12.0*  ALBUMIN 2.6*   No results for input(s): "LIPASE", "AMYLASE" in the last 168 hours. CBC: Recent Labs  Lab 12/04/22 0827 12/04/22 0918 12/04/22 1635 12/05/22 0100  WBC 4.4 5.7  --  4.1  NEUTROABS 2.9 3.1  --   --   HGB 3.5* 3.5* 6.2* 7.4*  HCT 11.8* 11.7* 19.4* 23.0*  MCV 99.2 96.7  --  90.6  PLT 131* 137*  --  115*   Blood Culture    Component Value Date/Time   SDES  12/04/2022 0630    BLOOD LEFT ANTECUBITAL Performed at [facility], [address], [place], [place] [address]    SPECREQUEST  12/04/2022 0630    BOTTLES DRAWN AEROBIC AND ANAEROBIC Blood Culture adequate volume Performed at [facility], [address], [place], [place] [address]    CULT  12/04/2022 0630    NO GROWTH < 24 HOURS Performed at [facility], [address], [place], [place] [address]    REPTSTATUS PENDING 12/04/2022 0630    Cardiac Enzymes: Recent Labs  Lab 12/04/22 0515  CKTOTAL 66   CBG: Recent Labs  Lab 12/04/22 1633 12/04/22 1943 12/05/22 0005 12/05/22 0456 12/05/22 0722  GLUCAP 169* 148* 179* 171* 135*   Iron Studies: No results for input(s): "IRON", "TIBC", "TRANSFERRIN", "FERRITIN" in the last 72 hours. @lablastinr3 @ Studies/Results: CT Chest Wo Contrast  Result Date: 12/04/2022 CLINICAL DATA:  Blunt chest trauma. EXAM: CT CHEST, ABDOMEN AND PELVIS WITHOUT CONTRAST TECHNIQUE: Multidetector CT imaging of the chest, abdomen and pelvis was performed following the standard protocol without IV contrast. RADIATION DOSE REDUCTION: This exam was performed according to the departmental dose-optimization program which includes automated exposure control, adjustment of the mA and/or kV according to patient size and/or use of iterative reconstruction technique. COMPARISON:  Chest CT dated 09/26/2022. FINDINGS: CT CHEST FINDINGS Cardiovascular: No thoracic aortic aneurysm. No pericardial effusion. Mediastinum/Nodes: No mass or enlarged lymph nodes within the mediastinum.  No hemorrhage or edema is seen within the mediastinum. Esophagus is unremarkable. Trachea is unremarkable. Lungs/Pleura: Mild dependent atelectasis bilaterally. Lungs appear otherwise clear. No pleural effusion or hemothorax. No pneumothorax. Musculoskeletal: Innumerable lytic lesions throughout the thoracic spine and bilateral ribs, corresponding to patient's known multiple myeloma. Associated large expansile lesion within the anterior second RIGHT rib. Multiple associated pathologic rib fractures bilaterally, of uncertain age but likely chronic. Displaced fracture of the lower RIGHT scapula, also likely pathologic  related to underlying lytic lesions (multiple myeloma), of uncertain age. No acute appearing fracture or subluxation within the thoracic spine. Large expansile mass involving the T4 vertebral body with soft tissue component extending into the central canal and LEFT neural foramen, with almost certain mass effect on the thoracic cord and exiting nerve root. CT ABDOMEN PELVIS FINDINGS Hepatobiliary: No focal liver abnormality is seen. Gallbladder is unremarkable. No perihepatic hematoma. No bile duct dilatation is seen. Pancreas: Partially infiltrated with fat but otherwise unremarkable. Spleen: No splenic injury or perisplenic hematoma. Adrenals/Urinary Tract: Adrenal glands appear normal. No adrenal hemorrhage. No renal injury identified. No renal stone or hydronephrosis. No ureteral or bladder calculi are identified. Bladder is unremarkable. Stomach/Bowel: No dilated large or small bowel loops. No evidence of bowel wall inflammation or bowel wall injury. Appendix is normal. Stomach is unremarkable. Vascular/Lymphatic: No abdominal aortic aneurysm. No periaortic hemorrhage. No enlarged lymph nodes are seen in the abdomen or pelvis. Reproductive: Moderate prostate gland enlargement with some associated mass effect on the bladder base. Other: No free fluid or hemorrhage is seen within the abdomen or pelvis. No free intraperitoneal air. Musculoskeletal: Innumerable lytic lesions throughout the lumbar spine and osseous pelvis, corresponding to patient's known multiple myeloma. Questionable mild compression deformity of the L4 vertebral body, likely pathologic, nonacute and related to the underlying lytic changes of patient's multiple myeloma. No acute-appearing osseous abnormality is seen within the abdomen or pelvis. IMPRESSION: 1. Innumerable lytic lesions throughout the thoracic spine, bilateral ribs, lumbar spine and osseous pelvis, corresponding to patient's known multiple myeloma. 2. Large expansile mass involving  the T4 vertebral body with soft tissue component extending into the central canal and LEFT neural foramen, with almost certain mass effect on the thoracic cord and exiting nerve root. Consider MRI of the thoracic spine without and with IV contrast to further evaluate. 3. Multiple associated pathologic rib fractures bilaterally, of uncertain age but likely chronic. 4. Displaced fracture of the lower RIGHT scapula, of uncertain age but also likely pathologic related to underlying lytic lesions (multiple myeloma). 5. Questionable mild compression deformity of the L4 vertebral body, likely pathologic, nonacute and related to the underlying lytic changes of patient's multiple myeloma. 6. No acute findings within the chest, abdomen or pelvis. No evidence of solid organ injury. No hemorrhage or edema within the mediastinum. No pleural effusion or pneumothorax. No evidence of bowel wall injury. 7. Moderate prostate gland enlargement with some associated mass effect on the bladder base. Electronically Signed   By: Bary Richard M.D.   On: 12/04/2022 08:42   CT ABDOMEN PELVIS WO CONTRAST  Result Date: 12/04/2022 CLINICAL DATA:  Blunt chest trauma. EXAM: CT CHEST, ABDOMEN AND PELVIS WITHOUT CONTRAST TECHNIQUE: Multidetector CT imaging of the chest, abdomen and pelvis was performed following the standard protocol without IV contrast. RADIATION DOSE REDUCTION: This exam was performed according to the departmental dose-optimization program which includes automated exposure control, adjustment of the mA and/or kV according to patient size and/or use of iterative reconstruction technique. COMPARISON:  Chest  CT dated 09/26/2022. FINDINGS: CT CHEST FINDINGS Cardiovascular: No thoracic aortic aneurysm. No pericardial effusion. Mediastinum/Nodes: No mass or enlarged lymph nodes within the mediastinum. No hemorrhage or edema is seen within the mediastinum. Esophagus is unremarkable. Trachea is unremarkable. Lungs/Pleura: Mild  dependent atelectasis bilaterally. Lungs appear otherwise clear. No pleural effusion or hemothorax. No pneumothorax. Musculoskeletal: Innumerable lytic lesions throughout the thoracic spine and bilateral ribs, corresponding to patient's known multiple myeloma. Associated large expansile lesion within the anterior second RIGHT rib. Multiple associated pathologic rib fractures bilaterally, of uncertain age but likely chronic. Displaced fracture of the lower RIGHT scapula, also likely pathologic related to underlying lytic lesions (multiple myeloma), of uncertain age. No acute appearing fracture or subluxation within the thoracic spine. Large expansile mass involving the T4 vertebral body with soft tissue component extending into the central canal and LEFT neural foramen, with almost certain mass effect on the thoracic cord and exiting nerve root. CT ABDOMEN PELVIS FINDINGS Hepatobiliary: No focal liver abnormality is seen. Gallbladder is unremarkable. No perihepatic hematoma. No bile duct dilatation is seen. Pancreas: Partially infiltrated with fat but otherwise unremarkable. Spleen: No splenic injury or perisplenic hematoma. Adrenals/Urinary Tract: Adrenal glands appear normal. No adrenal hemorrhage. No renal injury identified. No renal stone or hydronephrosis. No ureteral or bladder calculi are identified. Bladder is unremarkable. Stomach/Bowel: No dilated large or small bowel loops. No evidence of bowel wall inflammation or bowel wall injury. Appendix is normal. Stomach is unremarkable. Vascular/Lymphatic: No abdominal aortic aneurysm. No periaortic hemorrhage. No enlarged lymph nodes are seen in the abdomen or pelvis. Reproductive: Moderate prostate gland enlargement with some associated mass effect on the bladder base. Other: No free fluid or hemorrhage is seen within the abdomen or pelvis. No free intraperitoneal air. Musculoskeletal: Innumerable lytic lesions throughout the lumbar spine and osseous pelvis,  corresponding to patient's known multiple myeloma. Questionable mild compression deformity of the L4 vertebral body, likely pathologic, nonacute and related to the underlying lytic changes of patient's multiple myeloma. No acute-appearing osseous abnormality is seen within the abdomen or pelvis. IMPRESSION: 1. Innumerable lytic lesions throughout the thoracic spine, bilateral ribs, lumbar spine and osseous pelvis, corresponding to patient's known multiple myeloma. 2. Large expansile mass involving the T4 vertebral body with soft tissue component extending into the central canal and LEFT neural foramen, with almost certain mass effect on the thoracic cord and exiting nerve root. Consider MRI of the thoracic spine without and with IV contrast to further evaluate. 3. Multiple associated pathologic rib fractures bilaterally, of uncertain age but likely chronic. 4. Displaced fracture of the lower RIGHT scapula, of uncertain age but also likely pathologic related to underlying lytic lesions (multiple myeloma). 5. Questionable mild compression deformity of the L4 vertebral body, likely pathologic, nonacute and related to the underlying lytic changes of patient's multiple myeloma. 6. No acute findings within the chest, abdomen or pelvis. No evidence of solid organ injury. No hemorrhage or edema within the mediastinum. No pleural effusion or pneumothorax. No evidence of bowel wall injury. 7. Moderate prostate gland enlargement with some associated mass effect on the bladder base. Electronically Signed   By: Bary Richard M.D.   On: 12/04/2022 08:42   CT L-SPINE NO CHARGE  Result Date: 12/04/2022 CLINICAL DATA:  Fall. EXAM: CT LUMBAR SPINE WITHOUT CONTRAST TECHNIQUE: Multidetector CT imaging of the lumbar spine was performed without intravenous contrast administration. Multiplanar CT image reconstructions were also generated. RADIATION DOSE REDUCTION: This exam was performed according to the departmental dose-optimization  program which  includes automated exposure control, adjustment of the mA and/or kV according to patient size and/or use of iterative reconstruction technique. COMPARISON:  Plain film of the lumbar spine dated 11/22/2015. FINDINGS: Segmentation: 5 lumbar type vertebrae. Alignment: Stable.  No evidence of acute vertebral body subluxation. Vertebrae: No fracture line or displaced fracture fragment is seen. Questionable mild compression of the L4 vertebral body which is likely nonacute. Innumerable lytic lesions throughout the lumbar spine, corresponding to patient's known multiple myeloma. The mild compression of the L4 vertebral body is likely related to the underlying lytic changes. Paraspinal and other soft tissues: Visualized immediate paravertebral soft tissues are unremarkable. Disc levels: Disc desiccations at each level of the lumbar spine, with associated disc space narrowings and osteophyte formation, causing moderate to severe central canal stenoses at the L2-3 through L4-5 levels and mild-to-moderate central canal stenosis at L5-S1. Various degrees of neural foramen narrowing at each level of the lumbar spine as well, with possible associated nerve root impingement. IMPRESSION: 1. No acute fracture or subluxation within the lumbar spine. 2. Innumerable lytic lesions throughout the lumbar spine, corresponding to patient's known multiple myeloma. 3. Questionable interval mild compression of the L4 vertebral body which is likely nonacute and related to the underlying lytic lesions (multiple myeloma). 4. Multilevel degenerative disc disease, causing moderate to severe central canal stenoses at the L2-3 through L4-5 levels and mild-to-moderate central canal stenosis at L5-S1, with possible associated nerve root impingement at 1 or more levels. Electronically Signed   By: Bary Richard M.D.   On: 12/04/2022 08:30   CT Head Wo Contrast  Result Date: 12/04/2022 CLINICAL DATA:  Head trauma, moderate-severe; Neck  trauma (Age >= 65y) EXAM: CT HEAD WITHOUT CONTRAST CT CERVICAL SPINE WITHOUT CONTRAST TECHNIQUE: Multidetector CT imaging of the head and cervical spine was performed following the standard protocol without intravenous contrast. Multiplanar CT image reconstructions of the cervical spine were also generated. RADIATION DOSE REDUCTION: This exam was performed according to the departmental dose-optimization program which includes automated exposure control, adjustment of the mA and/or kV according to patient size and/or use of iterative reconstruction technique. COMPARISON:  None Available. FINDINGS: CT HEAD FINDINGS Brain: No hemorrhage. No extra-axial fluid collection. No hydrocephalus. No CT evidence of an acute cortical infarct. Mild chronic microvascular ischemic change. Vascular: No hyperdense vessel or unexpected calcification. Skull: No acute fracture. There are nonspecific scattered lucent lesions throughout the calvarium, largest which is at the vertex (series 7, image 41). Sinuses/Orbits: No middle ear or mastoid effusion. Polypoid mucosal thickening in the right sphenoid left maxillary sinus. Orbits are unremarkable. Other: None. CT CERVICAL SPINE FINDINGS Alignment: Straightening of the normal cervical lordosis. Skull base and vertebrae: There is a mildly displaced fracture through the right C7 transverse process (series 5, image 30). 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 There are lytic lesions throughout the cervical spine involving every cervical vertebral body. Some of the lesions extend through the posterior cortex, for example at the right posterior C4 endplate (series 7, image 35) in the left aspect of T3 (series 3, image 2). Metastatic lesions are also seen in the bilateral scapula. Soft tissues and spinal canal: There is likely severe spinal canal stenosis at the T3 level secondary to epidural extension of tumor (series 9, image 1). Disc levels:  See above Upper chest:  Negative. Other: None IMPRESSION: 1. No acute intracranial abnormality. 2. Mildly displaced fracture through the right C7 transverse process. 3. Multiple lytic lesions throughout the cervical spine, calvarium, and  bilateral scapula. Findings could be seen in the setting of diffuse metastatic disease of myeloma. Some of the lesions extend through the posterior cortex, for example at the right posterior C4 endplate and left aspect of T3, with likely severe spinal canal stenosis at the T3 level secondary to epidural extension of tumor. Recommend MRI of the cervical and thoracic spine with and without contrast for further evaluation. Electronically Signed   By: Lorenza Cambridge M.D.   On: 12/04/2022 08:30   CT Cervical Spine Wo Contrast  Result Date: 12/04/2022 CLINICAL DATA:  Head trauma, moderate-severe; Neck trauma (Age >= 65y) EXAM: CT HEAD WITHOUT CONTRAST CT CERVICAL SPINE WITHOUT CONTRAST TECHNIQUE: Multidetector CT imaging of the head and cervical spine was performed following the standard protocol without intravenous contrast. Multiplanar CT image reconstructions of the cervical spine were also generated. RADIATION DOSE REDUCTION: This exam was performed according to the departmental dose-optimization program which includes automated exposure control, adjustment of the mA and/or kV according to patient size and/or use of iterative reconstruction technique. COMPARISON:  None Available. FINDINGS: CT HEAD FINDINGS Brain: No hemorrhage. No extra-axial fluid collection. No hydrocephalus. No CT evidence of an acute cortical infarct. Mild chronic microvascular ischemic change. Vascular: No hyperdense vessel or unexpected calcification. Skull: No acute fracture. There are nonspecific scattered lucent lesions throughout the calvarium, largest which is at the vertex (series 7, image 41). Sinuses/Orbits: No middle ear or mastoid effusion. Polypoid mucosal thickening in the right sphenoid left maxillary sinus. Orbits are  unremarkable. Other: None. CT CERVICAL SPINE FINDINGS Alignment: Straightening of the normal cervical lordosis. Skull base and vertebrae: There is a mildly displaced fracture through the right C7 transverse process (series 5, image 30). 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 There are lytic lesions throughout the cervical spine involving every cervical vertebral body. Some of the lesions extend through the posterior cortex, for example at the right posterior C4 endplate (series 7, image 35) in the left aspect of T3 (series 3, image 2). Metastatic lesions are also seen in the bilateral scapula. Soft tissues and spinal canal: There is likely severe spinal canal stenosis at the T3 level secondary to epidural extension of tumor (series 9, image 1). Disc levels:  See above Upper chest: Negative. Other: None IMPRESSION: 1. No acute intracranial abnormality. 2. Mildly displaced fracture through the right C7 transverse process. 3. Multiple lytic lesions throughout the cervical spine, calvarium, and bilateral scapula. Findings could be seen in the setting of diffuse metastatic disease of myeloma. Some of the lesions extend through the posterior cortex, for example at the right posterior C4 endplate and left aspect of T3, with likely severe spinal canal stenosis at the T3 level secondary to epidural extension of tumor. Recommend MRI of the cervical and thoracic spine with and without contrast for further evaluation. Electronically Signed   By: Lorenza Cambridge M.D.   On: 12/04/2022 08:30   DG Chest Portable 1 View  Result Date: 12/04/2022 CLINICAL DATA:  Status post fall. EXAM: PORTABLE CHEST 1 VIEW COMPARISON:  PET-CT 10/06/2022 FINDINGS: Heart size and mediastinal contours are unremarkable. No pleural fluid or interstitial edema. No airspace consolidation. 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 right upper lobe lung mass is again seen measuring approximately 4.4 cm on today's study. Again seen  are extensive, multifocal lytic bone metastases which involve bilateral ribs. Age indeterminate pathologic fractures involving the lateral aspect of the right fourth rib and left posterior sixth rib noted. IMPRESSION: 1. No acute cardiopulmonary disease. 2.  Right upper lobe lung mass. 3. Extensive lytic bone metastases. Age indeterminate pathologic fractures involving the lateral aspect of the right fourth rib and left posterior sixth rib. Electronically Signed   By: Signa Kell M.D.   On: 12/04/2022 06:06   Medications:  cefTRIAXone (ROCEPHIN)  IV Stopped (12/05/22 1036)   lactated ringers 125 mL/hr at 12/05/22 1156    brimonidine  1 drop Both Eyes TID   Chlorhexidine Gluconate Cloth  6 each Topical Daily   feeding supplement  1 Container Oral TID BM   ferrous sulfate  325 mg Oral QODAY   insulin aspart  0-15 Units Subcutaneous Q4H   latanoprost  1 drop Both Eyes QHS   levothyroxine  150 mcg Oral Q0600   pantoprazole (PROTONIX) IV  40 mg Intravenous Q12H   polyethylene glycol  17 g Oral Daily   sennosides  15 mL Oral BID   tamsulosin  0.4 mg Oral Daily    Assessment/Plan **AKI on CKD: recent baseline Cr ~1.5-1.7mg /dL > 1/61 3.4 > presented with Cr 11.7.   Suspect that the untreated myeloma is a major driving factor here for his AKI but certainly initially very dry.  Imaging shows no obstruction.  We'll pursue maximum medical therapy at this point but would  not consider him a candidate for dialysis in light of his untreated malignancy, comorbid, functional status (wheel chair bound and nearly bed bound recently) and would recommend against it.  He currently has no indications for dialysis.  Having good UOP and some improvement in labs --> cont max medical care for now.   Avoid nephrotoxins, dose meds for crcl < 10, maintain euvolemia --> on IVF still, continue for today.    **hyponatremia: hypovolemic, improving with fluids.    **Metabolic acidosis: secondary to AKI, improving with IV  bicarb.    **Anemia:  Hb 3.5 initially improved to 7.4 after 2u pRBC.    **Myeloma:  dx 09/2022 --> saw Dr. Candise Che and PET, chemo ordered; pt frustrated by IV issue at PET in early 10/2022 and left scan (he reports), hasn't started chemo --> onc has been consulted inpatient as has palliative. Per report onc planning to proceed with chemo.   **Possible sepsis:  on broad spectrum abx per primary. 1 of 2 blood cultures with staph thought to be contaminant.     Will follow, reach out if I can further assist.     Estill Bakes MD 12/05/2022, 11:56 AM  [facility] Pager: [phone]

## 2022-12-05 NOTE — TOC Progression Note (Signed)
Transition of Care [facility]) - Progression Note    Patient Details  Name: Scott Bass MRN: [id number] Date of Birth: 05/29/1950  Transition of Care [facility]) CM/SW Contact  Geni Bers, RN Phone Number: 12/05/2022, 8:40 AM  Clinical Narrative:     Transition of Care (TOC) Screening Note   Patient Details  Name: Scott Bass Date of Birth: Aug 01, 1950   Transition of Care [facility]) CM/SW Contact:    Geni Bers, RN Phone Number: 12/05/2022, 8:40 AM    Transition of Care Department (TOC) has reviewed patient and no TOC needs have been identified at this time. We will continue to monitor patient advancement through interdisciplinary progression rounds. If new patient transition needs arise, please place a TOC consult.          Expected Discharge Plan and Services                                               Social Determinants of Health (SDOH) Interventions SDOH Screenings   Food Insecurity: No Food Insecurity (10/10/2022)   Received from [facility], [facility]  Transportation Needs: No Transportation Needs (10/10/2022)   Received from [facility], [facility]  Utilities: Not At Risk (10/10/2022)   Received from [facility], [facility]  Financial Resource Strain: Low Risk  (10/10/2022)   Received from [facility], [facility]  Physical Activity: Insufficiently Active (12/23/2020)   Received from [facility], [facility]  Social Connections: Unknown (09/16/2021)   Received from [facility], [facility]  Stress: No Stress Concern Present (12/23/2020)   Received from [facility], [facility]  Tobacco Use: Low Risk  (10/10/2022)   Received [facility], [facility]    Readmission Risk Interventions     No data to display

## 2022-12-05 NOTE — Progress Notes (Addendum)
eLink Physician-Brief Progress Note Patient Name: Scott Bass DOB: 1950/11/13 MRN: [id number]   Date of Service  12/05/2022  HPI/Events of Note  Ongoing agitation refractory to Haldol x 3  eICU Interventions  Added as needed Ativan.   0619 -1 bottle of blood cultures growing strep species.  Likely contamination.  Continue Rocephin.  No change indicated at this time.  Intervention Category Minor Interventions: Agitation / anxiety - evaluation and management  [name] 12/05/2022, 5:03 AM

## 2022-12-05 NOTE — Progress Notes (Signed)
eLink Physician-Brief Progress Note Patient Name: Scott Bass DOB: Jan 16, 1951 MRN: [id number]   Date of Service  12/05/2022  HPI/Events of Note  Patient with agitated delirium.  eICU Interventions  PRN IM Zyprexa ordered.        Migdalia Dk 12/05/2022, 8:26 PM

## 2022-12-05 NOTE — Progress Notes (Addendum)
NAME:  Scott Bass, MRN:  [id number], DOB:  10-May-1950, LOS: 1 ADMISSION DATE:  12/04/2022, CONSULTATION DATE:  7/29 REFERRING MD:  Rancour, CHIEF COMPLAINT: Acute on chronic renal failure, severe anemia, acute metabolic encephalopathy  History of Present Illness:  This is a 72 year old male patient who is typically wheelchair-bound.  Recently diagnosed with multiple myeloma following history of transfusion requiring anemia.Most recently seen by oncology on 5/30 with plans to initiate dexamethasone and daratumanub.  He has yet to initiate therapy, and has yet to initiate chemotherapy education presented to the emergency room on 7/29.  After EMS was called to the home for a fall.  On arrival he was found to be tachycardic, slightly confused, complaining of severe dry mouth and constipation.  Denied taking NSAIDs.  No fever chills or dyspnea.  Was brought to the emergency room for further evaluation.  On evaluation he was normotensive, heart rate 126, had dry mucous membranes, initial lab data showed sodium 128 potassium of 6.5 BUN of 139 creatinine of 11.76, pH was 7.21 initial lactate was 6, troponin I was unremarkable, initial CBC with a hemoglobin of 3.5.  He was administered IV crystalloid in the emergency room, cultures were sent, and he was empirically treated with antibiotics given the elevated lactate, and blood transfusion was ordered.  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 CT brain was negative CT cervical spine showed mildly displaced fracture at C7, multiple lytic lesions in the cervical spine there was also lesion extending through the posterior cortex of the right posterior C4 endplate and left aspect of T3 felt to be disease involvement of the spinal canal with epidural extension of tumor there were no acute fractures noted there were innumerable lesions throughout the lumbar spine with mild compression of the L4 vertebral body, there innumerable lytic lesions throughout the thoracic spine, bilateral ribs, lumbar spine and osseous pelvis he also had a large expansile mass involving T4 vertebral body with soft tissue component extending into the central canal multiple associated multiple rib fractures felt pathologic.  Started on IV hydration, transfused, nephrology consulted and felt not a candidate for dialysis.  Hematology and palliative care consult pending 7/30 seen by onc. Wants to start decadron and Velcade. Got ativan last night, now over sedated but protecting airway. Ativan stopped. Renal fxn resolved. Acidosis resolved. IVF changed to LR   Interim History / Subjective:  Now sedated. ETCO2 30s got ativan last night   Objective   Blood pressure (!) 143/105, pulse (!) 125, temperature (!) 97.4 F (36.3 C), temperature source Axillary, resp. rate (!) 22, height 5\' 6"  (1.676 m), weight 68.2 kg, SpO2 95%.        Intake/Output Summary (Last 24 hours) at 12/05/2022 1055 Last data filed at 12/05/2022 0800 Gross per 24 hour  Intake 3574.73 ml  Output 1150 ml  Net 2424.73 ml   Filed Weights   12/04/22 1030 12/05/22  0500  Weight: 69 kg 68.2 kg    Examination:  General chronically ill 72 year old male laying in bed HENT NCAT no JVD MM a little more moist Pulm clear Card tachy rrr Abd soft.  Denies tenderness today  GU cnc yellow  Neuro was minimally responsive. Got ativan earlier today now moving around in bed. No focal motor def. Sp slurred.   Resolved Hospital Problem list   Anion gap metabolic acidosis  Hyperkalemia  Assessment & Plan:   Acute metabolic encephalopathy and now oversedated 7/30.  Status post fall secondary to Uremia and acute renal failure.  Likely further exacerbated by metabolic acidosis. As of 7/30 element of Benzo effect Plan Continuing supportive care Hold sedation as able PRN haldol ck Qtc  Dc ativan   Acute on chronic stage IV renal failure Hospital secondary to mixed picture of dehydration and anemia, further complicated by known underlying multiple myeloma Plan Keep foley cath  Strict intake output Ensure MAP > 65 Renal adjust meds Change bicarb to LR I spoke with nephrology, we both agree he is not a candidate for dialysis given his advanced malignancy  R/o sepsis. Really no clear source at this point  1 out of 2 GPC Positive blood culture-->prob contaminant  Plan Repeat BC tomorrow  Trend PCT Day 2/5 ceftriaxone may stop early if PCT completely neg and f/u cultures neg   Fluid and electrolyte imbalance:, hyponatremia context of acute on chronic renal failure.  Stable  Plan Trend serial chemistries   Progressive multiple myeloma with extensive bone involvement. He has yet to initiate therapy.  He is wheelchair-bound, severely debilitated, and now with acute renal failure w/ concern for progression of epidural tumor at T3T4 spinal canal. Onc feels like could improve w/ treatment.  Plan Awaiting light chains Start decadron 20mg /d x 4 days when able to take POs Also planning to start  Velcade I consulted palliative care   Symptomatic anemia. In the setting of multiple myeloma hemoglobin 3.5.  He is required blood transfusion in the past. Now s/p 3 units blood on 7/29 Plan Hold AC Am cbc Transfuse for hgb < 7  F/u FOC    Constipation Improved. Had [facility] 7/29 Plan Cont bowel regimen   Hypothyroidism  Plan Cont synthroid   Thrombocytopenia Stable no bleeding Plan Trend   hyperglycemia  Glucose w/in goal  Plan Ssi   Best Practice (right click and "Reselect all SmartList Selections" daily)   Diet/type: NPO w/ oral meds DVT prophylaxis: SCD GI prophylaxis: PPI Lines: N/A Foley:  Yes, and it is still needed Code Status:  full code Last date of multidisciplinary goals of care discussion [pending ] Palliative consulted. Wife updated 7/29, will reach out again later today   Critical care time: NA   Will ask [facility] to assume care as SDU setting   Simonne Martinet ACNP-BC [facility] Pulmonary/Critical Care Pager # [phone] OR # [phone] if no answer

## 2022-12-05 NOTE — Progress Notes (Signed)
Notified Lab that ABG being sent for analysis. 

## 2022-12-05 NOTE — Progress Notes (Signed)
IP PROGRESS NOTE  Subjective:   Scott Bass is lethargic this morning after receiving lorazepam.  Objective: Vital signs in last 24 hours: Blood pressure (!) 150/66, pulse 96, temperature 97.6 F (36.4 C), temperature source Oral, resp. rate 19, height 5\' 6"  (1.676 m), weight 150 lb 5.7 oz (68.2 kg), SpO2 98%.  Intake/Output from previous day: 07/29 0701 - 07/30 0700 In: 3399.6 [I.V.:2362.1; Blood:1037.5] Out: 1150 [Urine:1150]  Physical Exam:  Neurologic: Lethargic, opens eyes with sternal rub, not speaking words, not following commands  Extremities: No leg edema, mild edema of the left arm   Lab Results: Recent Labs    12/04/22 0918 12/04/22 1635 12/05/22 0100  WBC 5.7  --  4.1  HGB 3.5* 6.2* 7.4*  HCT 11.7* 19.4* 23.0*  PLT 137*  --  115*    BMET Recent Labs    12/04/22 1635 12/05/22 0100  NA 131* 132*  K 4.2 3.9  CL 101 98  CO2 16* 22  GLUCOSE 197* 214*  BUN 116* 113*  CREATININE 10.40* 9.52*  CALCIUM 8.6* 8.2*    No results found for: "CEA1", "CEA", "LKG401", "CA125"  Studies/Results: CT Chest Wo Contrast  Result Date: 12/04/2022 CLINICAL DATA:  Blunt chest trauma. EXAM: CT CHEST, ABDOMEN AND PELVIS WITHOUT CONTRAST TECHNIQUE: Multidetector CT imaging of the chest, abdomen and pelvis was performed following the standard protocol without IV contrast. RADIATION DOSE REDUCTION: This exam was performed according to the departmental dose-optimization program which includes automated exposure control, adjustment of the mA and/or kV according to patient size and/or use of iterative reconstruction technique. COMPARISON:  Chest CT dated 09/26/2022. FINDINGS: CT CHEST FINDINGS Cardiovascular: No thoracic aortic aneurysm. No pericardial effusion. Mediastinum/Nodes: No mass or enlarged lymph nodes within the mediastinum. No hemorrhage or edema is seen within the mediastinum. Esophagus is unremarkable. Trachea is unremarkable. Lungs/Pleura: Mild dependent atelectasis  bilaterally. Lungs appear otherwise clear. No pleural effusion or hemothorax. No pneumothorax. Musculoskeletal: Innumerable lytic lesions throughout the thoracic spine and bilateral ribs, corresponding to patient's known multiple myeloma. Associated large expansile lesion within the anterior second RIGHT rib. Multiple associated pathologic rib fractures bilaterally, of uncertain age but likely chronic. Displaced fracture of the lower RIGHT scapula, also likely pathologic related to underlying lytic lesions (multiple myeloma), of uncertain age. No acute appearing fracture or subluxation within the thoracic spine. Large expansile mass involving the T4 vertebral body with soft tissue component extending into the central canal and LEFT neural foramen, with almost certain mass effect on the thoracic cord and exiting nerve root. CT ABDOMEN PELVIS FINDINGS Hepatobiliary: No focal liver abnormality is seen. Gallbladder is unremarkable. No perihepatic hematoma. No bile duct dilatation is seen. Pancreas: Partially infiltrated with fat but otherwise unremarkable. Spleen: No splenic injury or perisplenic hematoma. Adrenals/Urinary Tract: Adrenal glands appear normal. No adrenal hemorrhage. No renal injury identified. No renal stone or hydronephrosis. No ureteral or bladder calculi are identified. Bladder is unremarkable. Stomach/Bowel: No dilated large or small bowel loops. No evidence of bowel wall inflammation or bowel wall injury. Appendix is normal. Stomach is unremarkable. Vascular/Lymphatic: No abdominal aortic aneurysm. No periaortic hemorrhage. No enlarged lymph nodes are seen in the abdomen or pelvis. Reproductive: Moderate prostate gland enlargement with some associated mass effect on the bladder base. Other: No free fluid or hemorrhage is seen within the abdomen or pelvis. No free intraperitoneal air. Musculoskeletal: Innumerable lytic lesions throughout the lumbar spine and osseous pelvis, corresponding to patient's  known multiple myeloma. Questionable mild compression deformity of the L4  vertebral body, likely pathologic, nonacute and related to the underlying lytic changes of patient's multiple myeloma. No acute-appearing osseous abnormality is seen within the abdomen or pelvis. IMPRESSION: 1. Innumerable lytic lesions throughout the thoracic spine, bilateral ribs, lumbar spine and osseous pelvis, corresponding to patient's known multiple myeloma. 2. Large expansile mass involving the T4 vertebral body with soft tissue component extending into the central canal and LEFT neural foramen, with almost certain mass effect on the thoracic cord and exiting nerve root. Consider MRI of the thoracic spine without and with IV contrast to further evaluate. 3. Multiple associated pathologic rib fractures bilaterally, of uncertain age but likely chronic. 4. Displaced fracture of the lower RIGHT scapula, of uncertain age but also likely pathologic related to underlying lytic lesions (multiple myeloma). 5. Questionable mild compression deformity of the L4 vertebral body, likely pathologic, nonacute and related to the underlying lytic changes of patient's multiple myeloma. 6. No acute findings within the chest, abdomen or pelvis. No evidence of solid organ injury. No hemorrhage or edema within the mediastinum. No pleural effusion or pneumothorax. No evidence of bowel wall injury. 7. Moderate prostate gland enlargement with some associated mass effect on the bladder base. Electronically Signed   By: Bary Richard M.D.   On: 12/04/2022 08:42   CT ABDOMEN PELVIS WO CONTRAST  Result Date: 12/04/2022 CLINICAL DATA:  Blunt chest trauma. EXAM: CT CHEST, ABDOMEN AND PELVIS WITHOUT CONTRAST TECHNIQUE: Multidetector CT imaging of the chest, abdomen and pelvis was performed following the standard protocol without IV contrast. RADIATION DOSE REDUCTION: This exam was performed according to the departmental dose-optimization program which includes  automated exposure control, adjustment of the mA and/or kV according to patient size and/or use of iterative reconstruction technique. COMPARISON:  Chest CT dated 09/26/2022. FINDINGS: CT CHEST FINDINGS Cardiovascular: No thoracic aortic aneurysm. No pericardial effusion. Mediastinum/Nodes: No mass or enlarged lymph nodes within the mediastinum. No hemorrhage or edema is seen within the mediastinum. Esophagus is unremarkable. Trachea is unremarkable. Lungs/Pleura: Mild dependent atelectasis bilaterally. Lungs appear otherwise clear. No pleural effusion or hemothorax. No pneumothorax. Musculoskeletal: Innumerable lytic lesions throughout the thoracic spine and bilateral ribs, corresponding to patient's known multiple myeloma. Associated large expansile lesion within the anterior second RIGHT rib. Multiple associated pathologic rib fractures bilaterally, of uncertain age but likely chronic. Displaced fracture of the lower RIGHT scapula, also likely pathologic related to underlying lytic lesions (multiple myeloma), of uncertain age. No acute appearing fracture or subluxation within the thoracic spine. Large expansile mass involving the T4 vertebral body with soft tissue component extending into the central canal and LEFT neural foramen, with almost certain mass effect on the thoracic cord and exiting nerve root. CT ABDOMEN PELVIS FINDINGS Hepatobiliary: No focal liver abnormality is seen. Gallbladder is unremarkable. No perihepatic hematoma. No bile duct dilatation is seen. Pancreas: Partially infiltrated with fat but otherwise unremarkable. Spleen: No splenic injury or perisplenic hematoma. Adrenals/Urinary Tract: Adrenal glands appear normal. No adrenal hemorrhage. No renal injury identified. No renal stone or hydronephrosis. No ureteral or bladder calculi are identified. Bladder is unremarkable. Stomach/Bowel: No dilated large or small bowel loops. No evidence of bowel wall inflammation or bowel wall injury.  Appendix is normal. Stomach is unremarkable. Vascular/Lymphatic: No abdominal aortic aneurysm. No periaortic hemorrhage. No enlarged lymph nodes are seen in the abdomen or pelvis. Reproductive: Moderate prostate gland enlargement with some associated mass effect on the bladder base. Other: No free fluid or hemorrhage is seen within the abdomen or pelvis. No free intraperitoneal  air. Musculoskeletal: Innumerable lytic lesions throughout the lumbar spine and osseous pelvis, corresponding to patient's known multiple myeloma. Questionable mild compression deformity of the L4 vertebral body, likely pathologic, nonacute and related to the underlying lytic changes of patient's multiple myeloma. No acute-appearing osseous abnormality is seen within the abdomen or pelvis. IMPRESSION: 1. Innumerable lytic lesions throughout the thoracic spine, bilateral ribs, lumbar spine and osseous pelvis, corresponding to patient's known multiple myeloma. 2. Large expansile mass involving the T4 vertebral body with soft tissue component extending into the central canal and LEFT neural foramen, with almost certain mass effect on the thoracic cord and exiting nerve root. Consider MRI of the thoracic spine without and with IV contrast to further evaluate. 3. Multiple associated pathologic rib fractures bilaterally, of uncertain age but likely chronic. 4. Displaced fracture of the lower RIGHT scapula, of uncertain age but also likely pathologic related to underlying lytic lesions (multiple myeloma). 5. Questionable mild compression deformity of the L4 vertebral body, likely pathologic, nonacute and related to the underlying lytic changes of patient's multiple myeloma. 6. No acute findings within the chest, abdomen or pelvis. No evidence of solid organ injury. No hemorrhage or edema within the mediastinum. No pleural effusion or pneumothorax. No evidence of bowel wall injury. 7. Moderate prostate gland enlargement with some associated mass  effect on the bladder base. Electronically Signed   By: Bary Richard M.D.   On: 12/04/2022 08:42   CT L-SPINE NO CHARGE  Result Date: 12/04/2022 CLINICAL DATA:  Fall. EXAM: CT LUMBAR SPINE WITHOUT CONTRAST TECHNIQUE: Multidetector CT imaging of the lumbar spine was performed without intravenous contrast administration. Multiplanar CT image reconstructions were also generated. RADIATION DOSE REDUCTION: This exam was performed according to the departmental dose-optimization program which includes automated exposure control, adjustment of the mA and/or kV according to patient size and/or use of iterative reconstruction technique. COMPARISON:  Plain film of the lumbar spine dated 11/22/2015. FINDINGS: Segmentation: 5 lumbar type vertebrae. Alignment: Stable.  No evidence of acute vertebral body subluxation. Vertebrae: No fracture line or displaced fracture fragment is seen. Questionable mild compression of the L4 vertebral body which is likely nonacute. Innumerable lytic lesions throughout the lumbar spine, corresponding to patient's known multiple myeloma. The mild compression of the L4 vertebral body is likely related to the underlying lytic changes. Paraspinal and other soft tissues: Visualized immediate paravertebral soft tissues are unremarkable. Disc levels: Disc desiccations at each level of the lumbar spine, with associated disc space narrowings and osteophyte formation, causing moderate to severe central canal stenoses at the L2-3 through L4-5 levels and mild-to-moderate central canal stenosis at L5-S1. Various degrees of neural foramen narrowing at each level of the lumbar spine as well, with possible associated nerve root impingement. IMPRESSION: 1. No acute fracture or subluxation within the lumbar spine. 2. Innumerable lytic lesions throughout the lumbar spine, corresponding to patient's known multiple myeloma. 3. Questionable interval mild compression of the L4 vertebral body which is likely nonacute  and related to the underlying lytic lesions (multiple myeloma). 4. Multilevel degenerative disc disease, causing moderate to severe central canal stenoses at the L2-3 through L4-5 levels and mild-to-moderate central canal stenosis at L5-S1, with possible associated nerve root impingement at 1 or more levels. Electronically Signed   By: Bary Richard M.D.   On: 12/04/2022 08:30   CT Head Wo Contrast  Result Date: 12/04/2022 CLINICAL DATA:  Head trauma, moderate-severe; Neck trauma (Age >= 65y) EXAM: CT HEAD WITHOUT CONTRAST CT CERVICAL SPINE WITHOUT CONTRAST TECHNIQUE:  Multidetector CT imaging of the head and cervical spine was performed following the standard protocol without intravenous contrast. Multiplanar CT image reconstructions of the cervical spine were also generated. RADIATION DOSE REDUCTION: This exam was performed according to the departmental dose-optimization program which includes automated exposure control, adjustment of the mA and/or kV according to patient size and/or use of iterative reconstruction technique. COMPARISON:  None Available. FINDINGS: CT HEAD FINDINGS Brain: No hemorrhage. No extra-axial fluid collection. No hydrocephalus. No CT evidence of an acute cortical infarct. Mild chronic microvascular ischemic change. Vascular: No hyperdense vessel or unexpected calcification. Skull: No acute fracture. There are nonspecific scattered lucent lesions throughout the calvarium, largest which is at the vertex (series 7, image 41). Sinuses/Orbits: No middle ear or mastoid effusion. Polypoid mucosal thickening in the right sphenoid left maxillary sinus. Orbits are unremarkable. Other: None. CT CERVICAL SPINE FINDINGS Alignment: Straightening of the normal cervical lordosis. Skull base and vertebrae: There is a mildly displaced fracture through the right C7 transverse process (series 5, image 30). 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 There are lytic lesions throughout  the cervical spine involving every cervical vertebral body. Some of the lesions extend through the posterior cortex, for example at the right posterior C4 endplate (series 7, image 35) in the left aspect of T3 (series 3, image 2). Metastatic lesions are also seen in the bilateral scapula. Soft tissues and spinal canal: There is likely severe spinal canal stenosis at the T3 level secondary to epidural extension of tumor (series 9, image 1). Disc levels:  See above Upper chest: Negative. Other: None IMPRESSION: 1. No acute intracranial abnormality. 2. Mildly displaced fracture through the right C7 transverse process. 3. Multiple lytic lesions throughout the cervical spine, calvarium, and bilateral scapula. Findings could be seen in the setting of diffuse metastatic disease of myeloma. Some of the lesions extend through the posterior cortex, for example at the right posterior C4 endplate and left aspect of T3, with likely severe spinal canal stenosis at the T3 level secondary to epidural extension of tumor. Recommend MRI of the cervical and thoracic spine with and without contrast for further evaluation. Electronically Signed   By: Lorenza Cambridge M.D.   On: 12/04/2022 08:30   CT Cervical Spine Wo Contrast  Result Date: 12/04/2022 CLINICAL DATA:  Head trauma, moderate-severe; Neck trauma (Age >= 65y) EXAM: CT HEAD WITHOUT CONTRAST CT CERVICAL SPINE WITHOUT CONTRAST TECHNIQUE: Multidetector CT imaging of the head and cervical spine was performed following the standard protocol without intravenous contrast. Multiplanar CT image reconstructions of the cervical spine were also generated. RADIATION DOSE REDUCTION: This exam was performed according to the departmental dose-optimization program which includes automated exposure control, adjustment of the mA and/or kV according to patient size and/or use of iterative reconstruction technique. COMPARISON:  None Available. FINDINGS: CT HEAD FINDINGS Brain: No hemorrhage. No  extra-axial fluid collection. No hydrocephalus. No CT evidence of an acute cortical infarct. Mild chronic microvascular ischemic change. Vascular: No hyperdense vessel or unexpected calcification. Skull: No acute fracture. There are nonspecific scattered lucent lesions throughout the calvarium, largest which is at the vertex (series 7, image 41). Sinuses/Orbits: No middle ear or mastoid effusion. Polypoid mucosal thickening in the right sphenoid left maxillary sinus. Orbits are unremarkable. Other: None. CT CERVICAL SPINE FINDINGS Alignment: Straightening of the normal cervical lordosis. Skull base and vertebrae: There is a mildly displaced fracture through the right C7 transverse process (series 5, image 30). 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  There are lytic lesions throughout the cervical spine involving every cervical vertebral body. Some of the lesions extend through the posterior cortex, for example at the right posterior C4 endplate (series 7, image 35) in the left aspect of T3 (series 3, image 2). Metastatic lesions are also seen in the bilateral scapula. Soft tissues and spinal canal: There is likely severe spinal canal stenosis at the T3 level secondary to epidural extension of tumor (series 9, image 1). Disc levels:  See above Upper chest: Negative. Other: None IMPRESSION: 1. No acute intracranial abnormality. 2. Mildly displaced fracture through the right C7 transverse process. 3. Multiple lytic lesions throughout the cervical spine, calvarium, and bilateral scapula. Findings could be seen in the setting of diffuse metastatic disease of myeloma. Some of the lesions extend through the posterior cortex, for example at the right posterior C4 endplate and left aspect of T3, with likely severe spinal canal stenosis at the T3 level secondary to epidural extension of tumor. Recommend MRI of the cervical and thoracic spine with and without contrast for further evaluation.  Electronically Signed   By: Lorenza Cambridge M.D.   On: 12/04/2022 08:30   DG Chest Portable 1 View  Result Date: 12/04/2022 CLINICAL DATA:  Status post fall. EXAM: PORTABLE CHEST 1 VIEW COMPARISON:  PET-CT 10/06/2022 FINDINGS: Heart size and mediastinal contours are unremarkable. No pleural fluid or interstitial edema. No airspace consolidation. 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 right upper lobe lung mass is again seen measuring approximately 4.4 cm on today's study. Again seen are extensive, multifocal lytic bone metastases which involve bilateral ribs. Age indeterminate pathologic fractures involving the lateral aspect of the right fourth rib and left posterior sixth rib noted. IMPRESSION: 1. No acute cardiopulmonary disease. 2. Right upper lobe lung mass. 3. Extensive lytic bone metastases. Age indeterminate pathologic fractures involving the lateral aspect of the right fourth rib and left posterior sixth rib. Electronically Signed   By: Signa Kell M.D.   On: 12/04/2022 06:06    Medications: I have reviewed the patient's current medications.  Assessment/Plan: Multiple myeloma, IgG kappa, diagnosed May 2024-untreated CTs 12/04/2022-innumerable lytic lesions, large expansile lesion in the anterior right second rib, multiple pathologic rib fractures, expansile T4 (T3 on CT cervical spine) mass with soft tissue component extending into the central canal and left neural foramen, C7 transverse process fracture 2.  Acute/chronic renal failure 3.  Severe anemia 4.  Mild thrombocytopenia 5.  Diabetes 6.  Hypertension 7.  Glaucoma 8.  OSA  Scott Bass was recently diagnosed with multiple myeloma.  He has not been treated for myeloma.  He presents yesterday after a fall.  He has severe anemia and acute renal failure.  Anemia is likely secondary to untreated multiple myeloma.  The markedly elevated creatinine may be related to multiple myeloma (though the serum free light chains are not markedly  elevated) and dehydration.  Pain is likely secondary to rib fractures.  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  He does not have neurologic symptoms suggestive of a cord compression syndrome.     Scott Bass has no family member present this morning.    The creatinine is better following intravenous hydration.  Repeat serum free light chains are pending.  I recommend beginning pulsed Decadron when he is able to take medications by mouth.  I recommend 20 mg daily for 4 days for palliation of pain, prophylaxis against cord compression, and treatment of the myeloma.  I will discuss Velcade therapy with Scott Bass if he is more alert in the morning.   Recommendations: Intravenous hydration Begin course of pulsed Decadron, 20 mg oral daily for 4 days (can give IV if unable to take Decadron by mouth) Daily CBC and chemistry panel Management of renal failure per nephrology I will see him in the a.m. on 12/06/2022 to discuss Velcade chemotherapy Limit sedating medications as possible      LOS: 1 day   Thornton Papas, MD   12/05/2022, 7:20 AM

## 2022-12-05 NOTE — Plan of Care (Signed)
  Problem: Education: Goal: Knowledge of General Education information will improve Description: Including pain rating scale, medication(s)/side effects and non-pharmacologic comfort measures Outcome: Not Progressing   Problem: Health Behavior/Discharge Planning: Goal: Ability to manage health-related needs will improve Outcome: Not Progressing   Problem: Clinical Measurements: Goal: Diagnostic test results will improve Outcome: Not Progressing   Problem: Nutrition: Goal: Adequate nutrition will be maintained Outcome: Not Progressing   Problem: Coping: Goal: Level of anxiety will decrease Outcome: Not Progressing   Problem: Pain Managment: Goal: General experience of comfort will improve Outcome: Not Progressing

## 2022-12-05 NOTE — Consult Note (Signed)
Consultation Note Date: 12/05/2022   Patient Name: Scott Bass  DOB: 20-Oct-1950  MRN: [id number]  Age / Sex: 72 y.o., male   PCP: Tracey Harries, MD Referring Physician: Luciano Cutter, MD  Reason for Consultation: Establishing goals of care     Chief Complaint/History of Present Illness:   Patient is a 72 year old male with a past medical history of wheelchair-bound status, legally blind, CKD, GERD, OSA, hypertension, diabetes mellitus, and recent diagnosis of multiple myeloma who was admitted on 12/04/2022 after a fall at home.  Upon admission, patient was found to have AKI on CKD, encephalopathy, and multiple electrolyte imbalances.  Patient seen by oncology and nephrology during hospitalization.  Patient was supposed to follow-up with oncology on 612 though missed this appointment and so did not begin therapies for his multiple myeloma.  Palliative medicine team consulted to assist with complex medical decision making.  Reviewed EMR prior to presenting to bedside.  Creatinine trended down to 9.54 from 10.4.  Patient continuing to receive fluids for management.  Per EMR review, oncology planning to discuss Velcade therapy with patient when patient able to participate in conversation. Recommended pulsed steroids for symptoms and cancer management.  Patient was agitated overnight and required IV Haldol 2mg  x 4 doses and IV Ativan 2mg  x1 doses.  Patient also receiving IV Dilaudid 0.5 mg every 4 hours as needed for pain management x 2 doses in past 24 hours. Discussed care with PCCM provider. Patient and wife are both blind so having difficulties with transportation for cancer directed therapies.   Presented to bedside to meet with patient.  Patient sleeping comfortably in bed.  Attempted to awaken patient though remains lethargic after receiving multiple medications for severe agitation overnight.  No family present at bedside during visit.  Unable to reach patient's wife during the day.  Will continue attempts to discuss patient's care with her. Hopeful patient will become more interactive and responsive with current medical interventions to all him to participate at some point as well.   Primary Diagnoses  Present on Admission:  Acute on chronic renal failure (HCC)  Multiple myeloma without remission (HCC)   Palliative Review of Systems: lethargic  Past Medical History:  Diagnosis Date   Arthritis    "lower back" (12/18/2017)   Better eye: moderate vision impairment; lesser eye: blind    GERD (gastroesophageal reflux disease)    Glaucoma, both eyes    High cholesterol    Hypertension    Hypothyroidism    Irregular heartbeat    Legally blind    "both eyes; can see some out of left eye"   OSA (obstructive sleep apnea)    Thyroid disease    Type II diabetes mellitus (HCC)    Vitamin B12 deficiency    Social History   Socioeconomic History   Marital status: Married    Spouse name: Not on file   Number of children: 0   Years of education: Not on file   Highest education level: Not on file  Occupational History   Occupation: retired  Tobacco Use   Smoking status: Never   Smokeless tobacco: Never  Vaping Use   Vaping status: Never Used  Substance and Sexual Activity   Alcohol use: Yes    Comment: occ   Drug use: Never   Sexual activity: Not Currently  Other Topics Concern   Not on file  Social History Narrative   Not on file   Social Determinants of Corporate investment banker  Strain: Low Risk  (10/10/2022)   Received from [facility], [facility]   Overall Financial Resource Strain (CARDIA)    Difficulty of Paying Living Expenses: Not very hard  Food Insecurity: No Food Insecurity (10/10/2022)   Received from [facility], [facility]   Hunger Vital Sign    Worried About Running Out of Food in the Last Year: Never true    Ran Out of Food in the Last Year: Never true  Transportation Needs: No Transportation Needs (10/10/2022)   Received  from [facility], [facility]   PRAPARE - Transportation    Lack of Transportation (Medical): No    Lack of Transportation (Non-Medical): No  Physical Activity: Insufficiently Active (12/23/2020)   Received from [facility], [facility]   Exercise Vital Sign    Days of Exercise per Week: 2 days    Minutes of Exercise per Session: 10 min  Stress: No Stress Concern Present (12/23/2020)   Received from [facility], [facility] - Occupational Stress Questionnaire    Feeling of Stress : Not at all  Social Connections: Unknown (09/16/2021)   Received from [facility], [facility]   Social Network    Social Network: Not on file   Family History  Problem Relation Age of Onset   Diabetes Mellitus II Mother 26   Diabetes Mellitus II Sister    Breast cancer Sister    Heart failure Sister 66   Scheduled Meds:  brimonidine  1 drop Both Eyes TID   Chlorhexidine Gluconate Cloth  6 each Topical Daily   feeding supplement  1 Container Oral TID BM   ferrous sulfate  325 mg Oral QODAY   insulin aspart  0-15 Units Subcutaneous Q4H   latanoprost  1 drop Both Eyes QHS   levothyroxine  150 mcg Oral Q0600   polyethylene glycol  17 g Oral Daily   sennosides  15 mL Oral BID   tamsulosin  0.4 mg Oral Daily   Continuous Infusions:  cefTRIAXone (ROCEPHIN)  IV     sodium bicarbonate 150 mEq in dextrose 5 % 1,150 mL infusion 150 mL/hr at 12/05/22 0800   PRN Meds:.docusate sodium, haloperidol lactate, HYDROmorphone (DILAUDID) injection, LORazepam, ondansetron (ZOFRAN) IV, ondansetron, mouth rinse, phenol, polyethylene glycol, simethicone Allergies  Allergen Reactions   Hctz [Hydrochlorothiazide] Other (See Comments)    Pancreatitis    CBC:    Component Value Date/Time   WBC 4.1 12/05/2022 0100   HGB 7.4 (L) 12/05/2022 0100   HGB 8.1 (L) 10/05/2022 1017   HCT 23.0 (L) 12/05/2022 0100   PLT 115 (L) 12/05/2022 0100   PLT 131 (L) 10/05/2022  1017   MCV 90.6 12/05/2022 0100   MCV 83.7 11/22/2015 0927   NEUTROABS 3.1 12/04/2022 0918   LYMPHSABS 1.5 12/04/2022 0918   MONOABS 0.7 12/04/2022 0918   EOSABS 0.1 12/04/2022 0918   BASOSABS 0.0 12/04/2022 0918   Comprehensive Metabolic Panel:    Component Value Date/Time   NA 132 (L) 12/05/2022 0655   K 3.9 12/05/2022 0655   CL 96 (L) 12/05/2022 0655   CO2 22 12/05/2022 0655   BUN 117 (H) 12/05/2022 0655   CREATININE 9.54 (H) 12/05/2022 0655   CREATININE 3.39 (H) 10/05/2022 1017   CREATININE 1.21 11/22/2015 0917   GLUCOSE 154 (H) 12/05/2022 0655   CALCIUM 8.2 (L) 12/05/2022 0655   AST 35 12/04/2022 0515   AST 64 (H) 10/05/2022 1017   ALT 28 12/04/2022 0515  ALT 30 10/05/2022 1017   ALKPHOS 62 12/04/2022 0515   BILITOT 1.0 12/04/2022 0515   BILITOT 0.5 10/05/2022 1017   PROT >12.0 (H) 12/04/2022 0515   ALBUMIN 2.6 (L) 12/04/2022 0515    Physical Exam: Vital Signs: BP (!) 150/66   Pulse 96   Temp (!) 97.4 F (36.3 C) (Axillary)   Resp 19   Ht 5\' 6"  (1.676 m)   Wt 68.2 kg   SpO2 98%   BMI 24.27 kg/m  SpO2: SpO2: 98 % O2 Device: O2 Device: Room Air O2 Flow Rate:   Intake/output summary:  Intake/Output Summary (Last 24 hours) at 12/05/2022 0848 Last data filed at 12/05/2022 0800 Gross per 24 hour  Intake 3574.73 ml  Output 1150 ml  Net 2424.73 ml   LBM: Last BM Date : 12/04/22 Baseline Weight: Weight: 69 kg Most recent weight: Weight: 68.2 kg  General: NAD, lethargic, chronically ill appearing  Eyes: No drainage noted HENT: Dry mucous membranes Cardiovascular: Tachycardia noted Respiratory: no increased work of breathing noted, not in respiratory distress Abdomen: not distended Neuro: Lethargic          Palliative Performance Scale: 20%              Additional Data Reviewed: Recent Labs    12/04/22 0918 12/04/22 1635 12/05/22 0100 12/05/22 0655  WBC 5.7  --  4.1  --   HGB 3.5* 6.2* 7.4*  --   PLT 137*  --  115*  --   NA 131* 131* 132* 132*   BUN 121* 116* 113* 117*  CREATININE 11.06* 10.40* 9.52* 9.54*    Imaging: CT ABDOMEN PELVIS WO CONTRAST CLINICAL DATA:  Blunt chest trauma.  EXAM: CT CHEST, ABDOMEN AND PELVIS WITHOUT CONTRAST  TECHNIQUE: Multidetector CT imaging of the chest, abdomen and pelvis was performed following the standard protocol without IV contrast.  RADIATION DOSE REDUCTION: This exam was performed according to the departmental dose-optimization program which includes automated exposure control, adjustment of the mA and/or kV according to patient size and/or use of iterative reconstruction technique.  COMPARISON:  Chest CT dated 09/26/2022.  FINDINGS: CT CHEST FINDINGS  Cardiovascular: No thoracic aortic aneurysm. No pericardial effusion.  Mediastinum/Nodes: No mass or enlarged lymph nodes within the mediastinum. No hemorrhage or edema is seen within the mediastinum. Esophagus is unremarkable. Trachea is unremarkable.  Lungs/Pleura: Mild dependent atelectasis bilaterally. Lungs appear otherwise clear. No pleural effusion or hemothorax. No pneumothorax.  Musculoskeletal: Innumerable lytic lesions throughout the thoracic spine and bilateral ribs, corresponding to patient's known multiple myeloma. Associated large expansile lesion within the anterior second RIGHT rib. Multiple associated pathologic rib fractures bilaterally, of uncertain age but likely chronic.  Displaced fracture of the lower RIGHT scapula, also likely pathologic related to underlying lytic lesions (multiple myeloma), of uncertain age.  No acute appearing fracture or subluxation within the thoracic spine.  Large expansile mass involving the T4 vertebral body with soft tissue component extending into the central canal and LEFT neural foramen, with almost certain mass effect on the thoracic cord and exiting nerve root.  CT ABDOMEN PELVIS FINDINGS  Hepatobiliary: No focal liver abnormality is seen. Gallbladder  is unremarkable. No perihepatic hematoma. No bile duct dilatation is seen.  Pancreas: Partially infiltrated with fat but otherwise unremarkable.  Spleen: No splenic injury or perisplenic hematoma.  Adrenals/Urinary Tract: Adrenal glands appear normal. No adrenal hemorrhage. No renal injury identified. No renal stone or hydronephrosis. No ureteral or bladder calculi are identified. Bladder is unremarkable.  Stomach/Bowel:  No dilated large or small bowel loops. No evidence of bowel wall inflammation or bowel wall injury. Appendix is normal. Stomach is unremarkable.  Vascular/Lymphatic: No abdominal aortic aneurysm. No periaortic hemorrhage. No enlarged lymph nodes are seen in the abdomen or pelvis.  Reproductive: Moderate prostate gland enlargement with some associated mass effect on the bladder base.  Other: No free fluid or hemorrhage is seen within the abdomen or pelvis. No free intraperitoneal air.  Musculoskeletal: Innumerable lytic lesions throughout the lumbar spine and osseous pelvis, corresponding to patient's known multiple myeloma. Questionable mild compression deformity of the L4 vertebral body, likely pathologic, nonacute and related to the underlying lytic changes of patient's multiple myeloma.  No acute-appearing osseous abnormality is seen within the abdomen or pelvis.  IMPRESSION: 1. Innumerable lytic lesions throughout the thoracic spine, bilateral ribs, lumbar spine and osseous pelvis, corresponding to patient's known multiple myeloma. 2. Large expansile mass involving the T4 vertebral body with soft tissue component extending into the central canal and LEFT neural foramen, with almost certain mass effect on the thoracic cord and exiting nerve root. Consider MRI of the thoracic spine without and with IV contrast to further evaluate. 3. Multiple associated pathologic rib fractures bilaterally, of uncertain age but likely chronic. 4. Displaced fracture of  the lower RIGHT scapula, of uncertain age but also likely pathologic related to underlying lytic lesions (multiple myeloma). 5. Questionable mild compression deformity of the L4 vertebral body, likely pathologic, nonacute and related to the underlying lytic changes of patient's multiple myeloma. 6. No acute findings within the chest, abdomen or pelvis. No evidence of solid organ injury. No hemorrhage or edema within the mediastinum. No pleural effusion or pneumothorax. No evidence of bowel wall injury. 7. Moderate prostate gland enlargement with some associated mass effect on the bladder base.  Electronically Signed   By: Bary Richard M.D.   On: 12/04/2022 08:42 CT Chest Wo Contrast CLINICAL DATA:  Blunt chest trauma.  EXAM: CT CHEST, ABDOMEN AND PELVIS WITHOUT CONTRAST  TECHNIQUE: Multidetector CT imaging of the chest, abdomen and pelvis was performed following the standard protocol without IV contrast.  RADIATION DOSE REDUCTION: This exam was performed according to the departmental dose-optimization program which includes automated exposure control, adjustment of the mA and/or kV according to patient size and/or use of iterative reconstruction technique.  COMPARISON:  Chest CT dated 09/26/2022.  FINDINGS: CT CHEST FINDINGS  Cardiovascular: No thoracic aortic aneurysm. No pericardial effusion.  Mediastinum/Nodes: No mass or enlarged lymph nodes within the mediastinum. No hemorrhage or edema is seen within the mediastinum. Esophagus is unremarkable. Trachea is unremarkable.  Lungs/Pleura: Mild dependent atelectasis bilaterally. Lungs appear otherwise clear. No pleural effusion or hemothorax. No pneumothorax.  Musculoskeletal: Innumerable lytic lesions throughout the thoracic spine and bilateral ribs, corresponding to patient's known multiple myeloma. Associated large expansile lesion within the anterior second RIGHT rib. Multiple associated pathologic rib  fractures bilaterally, of uncertain age but likely chronic.  Displaced fracture of the lower RIGHT scapula, also likely pathologic related to underlying lytic lesions (multiple myeloma), of uncertain age.  No acute appearing fracture or subluxation within the thoracic spine.  Large expansile mass involving the T4 vertebral body with soft tissue component extending into the central canal and LEFT neural foramen, with almost certain mass effect on the thoracic cord and exiting nerve root.  CT ABDOMEN PELVIS FINDINGS  Hepatobiliary: No focal liver abnormality is seen. Gallbladder is unremarkable. No perihepatic hematoma. No bile duct dilatation is seen.  Pancreas: Partially infiltrated with  fat but otherwise unremarkable.  Spleen: No splenic injury or perisplenic hematoma.  Adrenals/Urinary Tract: Adrenal glands appear normal. No adrenal hemorrhage. No renal injury identified. No renal stone or hydronephrosis. No ureteral or bladder calculi are identified. Bladder is unremarkable.  Stomach/Bowel: No dilated large or small bowel loops. No evidence of bowel wall inflammation or bowel wall injury. Appendix is normal. Stomach is unremarkable.  Vascular/Lymphatic: No abdominal aortic aneurysm. No periaortic hemorrhage. No enlarged lymph nodes are seen in the abdomen or pelvis.  Reproductive: Moderate prostate gland enlargement with some associated mass effect on the bladder base.  Other: No free fluid or hemorrhage is seen within the abdomen or pelvis. No free intraperitoneal air.  Musculoskeletal: Innumerable lytic lesions throughout the lumbar spine and osseous pelvis, corresponding to patient's known multiple myeloma. Questionable mild compression deformity of the L4 vertebral body, likely pathologic, nonacute and related to the underlying lytic changes of patient's multiple myeloma.  No acute-appearing osseous abnormality is seen within the abdomen  or pelvis.  IMPRESSION: 1. Innumerable lytic lesions throughout the thoracic spine, bilateral ribs, lumbar spine and osseous pelvis, corresponding to patient's known multiple myeloma. 2. Large expansile mass involving the T4 vertebral body with soft tissue component extending into the central canal and LEFT neural foramen, with almost certain mass effect on the thoracic cord and exiting nerve root. Consider MRI of the thoracic spine without and with IV contrast to further evaluate. 3. Multiple associated pathologic rib fractures bilaterally, of uncertain age but likely chronic. 4. Displaced fracture of the lower RIGHT scapula, of uncertain age but also likely pathologic related to underlying lytic lesions (multiple myeloma). 5. Questionable mild compression deformity of the L4 vertebral body, likely pathologic, nonacute and related to the underlying lytic changes of patient's multiple myeloma. 6. No acute findings within the chest, abdomen or pelvis. No evidence of solid organ injury. No hemorrhage or edema within the mediastinum. No pleural effusion or pneumothorax. No evidence of bowel wall injury. 7. Moderate prostate gland enlargement with some associated mass effect on the bladder base.  Electronically Signed   By: Bary Richard M.D.   On: 12/04/2022 08:42 CT L-SPINE NO CHARGE CLINICAL DATA:  Fall.  EXAM: CT LUMBAR SPINE WITHOUT CONTRAST  TECHNIQUE: Multidetector CT imaging of the lumbar spine was performed without intravenous contrast administration. Multiplanar CT image reconstructions were also generated.  RADIATION DOSE REDUCTION: This exam was performed according to the departmental dose-optimization program which includes automated exposure control, adjustment of the mA and/or kV according to patient size and/or use of iterative reconstruction technique.  COMPARISON:  Plain film of the lumbar spine dated 11/22/2015.  FINDINGS: Segmentation: 5 lumbar type  vertebrae.  Alignment: Stable.  No evidence of acute vertebral body subluxation.  Vertebrae: No fracture line or displaced fracture fragment is seen. Questionable mild compression of the L4 vertebral body which is likely nonacute.  Innumerable lytic lesions throughout the lumbar spine, corresponding to patient's known multiple myeloma. The mild compression of the L4 vertebral body is likely related to the underlying lytic changes.  Paraspinal and other soft tissues: Visualized immediate paravertebral soft tissues are unremarkable.  Disc levels: Disc desiccations at each level of the lumbar spine, with associated disc space narrowings and osteophyte formation, causing moderate to severe central canal stenoses at the L2-3 through L4-5 levels and mild-to-moderate central canal stenosis at L5-S1. Various degrees of neural foramen narrowing at each level of the lumbar spine as well, with possible associated nerve root impingement.  IMPRESSION: 1. No  acute fracture or subluxation within the lumbar spine. 2. Innumerable lytic lesions throughout the lumbar spine, corresponding to patient's known multiple myeloma. 3. Questionable interval mild compression of the L4 vertebral body which is likely nonacute and related to the underlying lytic lesions (multiple myeloma). 4. Multilevel degenerative disc disease, causing moderate to severe central canal stenoses at the L2-3 through L4-5 levels and mild-to-moderate central canal stenosis at L5-S1, with possible associated nerve root impingement at 1 or more levels.  Electronically Signed   By: Bary Richard M.D.   On: 12/04/2022 08:30 CT Cervical Spine Wo Contrast CLINICAL DATA:  Head trauma, moderate-severe; Neck trauma (Age >= 65y)  EXAM: CT HEAD WITHOUT CONTRAST  CT CERVICAL SPINE WITHOUT CONTRAST  TECHNIQUE: Multidetector CT imaging of the head and cervical spine was performed following the standard protocol without  intravenous contrast. Multiplanar CT image reconstructions of the cervical spine were also generated.  RADIATION DOSE REDUCTION: This exam was performed according to the departmental dose-optimization program which includes automated exposure control, adjustment of the mA and/or kV according to patient size and/or use of iterative reconstruction technique.  COMPARISON:  None Available.  FINDINGS: CT HEAD FINDINGS  Brain: No hemorrhage. No extra-axial fluid collection. No hydrocephalus. No CT evidence of an acute cortical infarct. Mild chronic microvascular ischemic change.  Vascular: No hyperdense vessel or unexpected calcification.  Skull: No acute fracture. There are nonspecific scattered lucent lesions throughout the calvarium, largest which is at the vertex (series 7, image 41).  Sinuses/Orbits: No middle ear or mastoid effusion. Polypoid mucosal thickening in the right sphenoid left maxillary sinus. Orbits are unremarkable.  Other: None.  CT CERVICAL SPINE FINDINGS  Alignment: Straightening of the normal cervical lordosis.  Skull base and vertebrae: There is a mildly displaced fracture through the right C7 transverse process (series 5, image 30). 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  There are lytic lesions throughout the cervical spine involving every cervical vertebral body. Some of the lesions extend through the posterior cortex, for example at the right posterior C4 endplate (series 7, image 35) in the left aspect of T3 (series 3, image 2). Metastatic lesions are also seen in the bilateral scapula.  Soft tissues and spinal canal: There is likely severe spinal canal stenosis at the T3 level secondary to epidural extension of tumor (series 9, image 1).  Disc levels:  See above  Upper chest: Negative.  Other: None  IMPRESSION: 1. No acute intracranial abnormality. 2. Mildly displaced fracture through the right C7  transverse process. 3. Multiple lytic lesions throughout the cervical spine, calvarium, and bilateral scapula. Findings could be seen in the setting of diffuse metastatic disease of myeloma. Some of the lesions extend through the posterior cortex, for example at the right posterior C4 endplate and left aspect of T3, with likely severe spinal canal stenosis at the T3 level secondary to epidural extension of tumor. Recommend MRI of the cervical and thoracic spine with and without contrast for further evaluation.  Electronically Signed   By: Lorenza Cambridge M.D.   On: 12/04/2022 08:30 CT Head Wo Contrast CLINICAL DATA:  Head trauma, moderate-severe; Neck trauma (Age >= 65y)  EXAM: CT HEAD WITHOUT CONTRAST  CT CERVICAL SPINE WITHOUT CONTRAST  TECHNIQUE: Multidetector CT imaging of the head and cervical spine was performed following the standard protocol without intravenous contrast. Multiplanar CT image reconstructions of the cervical spine were also generated.  RADIATION DOSE REDUCTION: This exam was performed according to the departmental dose-optimization program which  includes automated exposure control, adjustment of the mA and/or kV according to patient size and/or use of iterative reconstruction technique.  COMPARISON:  None Available.  FINDINGS: CT HEAD FINDINGS  Brain: No hemorrhage. No extra-axial fluid collection. No hydrocephalus. No CT evidence of an acute cortical infarct. Mild chronic microvascular ischemic change.  Vascular: No hyperdense vessel or unexpected calcification.  Skull: No acute fracture. There are nonspecific scattered lucent lesions throughout the calvarium, largest which is at the vertex (series 7, image 41).  Sinuses/Orbits: No middle ear or mastoid effusion. Polypoid mucosal thickening in the right sphenoid left maxillary sinus. Orbits are unremarkable.  Other: None.  CT CERVICAL SPINE FINDINGS  Alignment: Straightening of the normal  cervical lordosis.  Skull base and vertebrae: There is a mildly displaced fracture through the right C7 transverse process (series 5, image 30). 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  There are lytic lesions throughout the cervical spine involving every cervical vertebral body. Some of the lesions extend through the posterior cortex, for example at the right posterior C4 endplate (series 7, image 35) in the left aspect of T3 (series 3, image 2). Metastatic lesions are also seen in the bilateral scapula.  Soft tissues and spinal canal: There is likely severe spinal canal stenosis at the T3 level secondary to epidural extension of tumor (series 9, image 1).  Disc levels:  See above  Upper chest: Negative.  Other: None  IMPRESSION: 1. No acute intracranial abnormality. 2. Mildly displaced fracture through the right C7 transverse process. 3. Multiple lytic lesions throughout the cervical spine, calvarium, and bilateral scapula. Findings could be seen in the setting of diffuse metastatic disease of myeloma. Some of the lesions extend through the posterior cortex, for example at the right posterior C4 endplate and left aspect of T3, with likely severe spinal canal stenosis at the T3 level secondary to epidural extension of tumor. Recommend MRI of the cervical and thoracic spine with and without contrast for further evaluation.  Electronically Signed   By: Lorenza Cambridge M.D.   On: 12/04/2022 08:30 DG Chest Portable 1 View CLINICAL DATA:  Status post fall.  EXAM: PORTABLE CHEST 1 VIEW  COMPARISON:  PET-CT 10/06/2022  FINDINGS: Heart size and mediastinal contours are unremarkable. No pleural fluid or interstitial edema. No airspace consolidation. 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 right upper lobe lung mass is again seen measuring approximately 4.4 cm on today's study. Again seen are extensive, multifocal lytic bone metastases which involve  bilateral ribs. Age indeterminate pathologic fractures involving the lateral aspect of the right fourth rib and left posterior sixth rib noted.  IMPRESSION: 1. No acute cardiopulmonary disease. 2. Right upper lobe lung mass. 3. Extensive lytic bone metastases. Age indeterminate pathologic fractures involving the lateral aspect of the right fourth rib and left posterior sixth rib.  Electronically Signed   By: Signa Kell M.D.   On: 12/04/2022 06:06    I personally reviewed recent imaging.   Palliative Care Assessment and Plan Summary of Established Goals of Care and Medical Treatment Preferences   Patient is a 72 year old male with a past medical history of wheelchair-bound status, legally blind, CKD, GERD, OSA, hypertension, diabetes mellitus, and recent diagnosis of multiple myeloma who was admitted on 12/04/2022 after a fall at home.  Upon admission, patient was found to have AKI on CKD, encephalopathy, and multiple electrolyte imbalances.  Patient seen by oncology and nephrology during hospitalization.  Patient was supposed to follow-up with oncology on 612 though  missed this appointment and so did not begin therapies for his multiple myeloma.  Palliative medicine team consulted to assist with complex medical decision making.  # Complex medical decision making/goals of care  -Patient unable to participate in complex medical decision making due to current medical status.  -Unable to reach patient's wife during the day. PMT will continue attempts to discuss patient's care with her. Hopeful patient will become more interactive and responsive with current medical interventions to all him to participate at some point as well.   -  Code Status: Full Code   # Symptom management  -As per PCCM  # Psycho-social/Spiritual Support:  - Support System: spouse listed in EMR  # Discharge Planning:  To Be Determined  Thank you for allowing the palliative care team to participate in the care  [name].  Alvester Morin, DO Palliative Care Provider PMT # [phone]  If patient remains symptomatic despite maximum doses, please call PMT at [phone] between 0700 and 1900. Outside of these hours, please call attending, as PMT does not have night coverage.  This provider spent a total of 80 minutes providing patient's care.  Includes review of EMR, discussing care with other staff members involved in patient's medical care, obtaining relevant history and information from patient and/or patient's family, and personal review of imaging and lab work. Greater than 50% of the time was spent counseling and coordinating care related to the above assessment and plan.    *Please note that this is a verbal dictation therefore any spelling or grammatical errors are due to the "[employer] One" system interpretation.

## 2022-12-05 NOTE — Progress Notes (Signed)
PHARMACY - PHYSICIAN COMMUNICATION CRITICAL VALUE ALERT - BLOOD CULTURE IDENTIFICATION (BCID)  Scott Bass is an 72 y.o. male who presented to [facility] on 12/04/2022 with a chief complaint of fall at home.  Assessment:  Currently on empiric antibiotics with low clinical suspicion of sepsis per MD note. Afebrile.  Blood cx growing GPC in 1/4 bottles, BCID + Staph species, no resistance.  Possible contaminant.   Name of physician (or Provider) Contacted: Paliwal  Current antibiotics: Rocephin   Changes to prescribed antibiotics recommended:  No changes recommended, continue to monitor on current regimen.   Results for orders placed or performed during the hospital encounter of 12/04/22  Blood Culture ID Panel (Reflexed) (Collected: 12/04/2022  6:16 AM)  Result Value Ref Range   Enterococcus faecalis NOT DETECTED NOT DETECTED   Enterococcus Faecium NOT DETECTED NOT DETECTED   Listeria monocytogenes NOT DETECTED NOT DETECTED   Staphylococcus species DETECTED (A) NOT DETECTED   Staphylococcus aureus (BCID) NOT DETECTED NOT DETECTED   Staphylococcus epidermidis NOT DETECTED NOT DETECTED   Staphylococcus lugdunensis NOT DETECTED NOT DETECTED   Streptococcus species NOT DETECTED NOT DETECTED   Streptococcus agalactiae NOT DETECTED NOT DETECTED   Streptococcus pneumoniae NOT DETECTED NOT DETECTED   Streptococcus pyogenes NOT DETECTED NOT DETECTED   A.calcoaceticus-baumannii NOT DETECTED NOT DETECTED   Bacteroides fragilis NOT DETECTED NOT DETECTED   Enterobacterales NOT DETECTED NOT DETECTED   Enterobacter cloacae complex NOT DETECTED NOT DETECTED   Escherichia coli NOT DETECTED NOT DETECTED   Klebsiella aerogenes NOT DETECTED NOT DETECTED   Klebsiella oxytoca NOT DETECTED NOT DETECTED   Klebsiella pneumoniae NOT DETECTED NOT DETECTED   Proteus species NOT DETECTED NOT DETECTED   Salmonella species NOT DETECTED NOT DETECTED   Serratia marcescens NOT DETECTED NOT DETECTED    Haemophilus influenzae NOT DETECTED NOT DETECTED   Neisseria meningitidis NOT DETECTED NOT DETECTED   Pseudomonas aeruginosa NOT DETECTED NOT DETECTED   Stenotrophomonas maltophilia NOT DETECTED NOT DETECTED   Candida albicans NOT DETECTED NOT DETECTED   Candida auris NOT DETECTED NOT DETECTED   Candida glabrata NOT DETECTED NOT DETECTED   Candida krusei NOT DETECTED NOT DETECTED   Candida parapsilosis NOT DETECTED NOT DETECTED   Candida tropicalis NOT DETECTED NOT DETECTED   Cryptococcus neoformans/gattii NOT DETECTED NOT DETECTED    Junita Push PharmD 12/05/2022  6:07 AM

## 2022-12-06 ENCOUNTER — Encounter (HOSPITAL_COMMUNITY): Payer: Self-pay | Admitting: Pulmonary Disease

## 2022-12-06 ENCOUNTER — Encounter: Payer: Self-pay | Admitting: Hematology

## 2022-12-06 ENCOUNTER — Other Ambulatory Visit: Payer: Self-pay

## 2022-12-06 DIAGNOSIS — N184 Chronic kidney disease, stage 4 (severe): Secondary | ICD-10-CM

## 2022-12-06 DIAGNOSIS — N170 Acute kidney failure with tubular necrosis: Secondary | ICD-10-CM | POA: Diagnosis not present

## 2022-12-06 DIAGNOSIS — N179 Acute kidney failure, unspecified: Secondary | ICD-10-CM | POA: Diagnosis not present

## 2022-12-06 DIAGNOSIS — Z7189 Other specified counseling: Secondary | ICD-10-CM | POA: Diagnosis not present

## 2022-12-06 DIAGNOSIS — R4589 Other symptoms and signs involving emotional state: Secondary | ICD-10-CM | POA: Diagnosis present

## 2022-12-06 DIAGNOSIS — Z515 Encounter for palliative care: Secondary | ICD-10-CM | POA: Diagnosis not present

## 2022-12-06 DIAGNOSIS — C9000 Multiple myeloma not having achieved remission: Secondary | ICD-10-CM | POA: Diagnosis not present

## 2022-12-06 LAB — GLUCOSE, CAPILLARY
Glucose-Capillary: 142 mg/dL — ABNORMAL HIGH (ref 70–99)
Glucose-Capillary: 142 mg/dL — ABNORMAL HIGH (ref 70–99)
Glucose-Capillary: 153 mg/dL — ABNORMAL HIGH (ref 70–99)
Glucose-Capillary: 163 mg/dL — ABNORMAL HIGH (ref 70–99)
Glucose-Capillary: 164 mg/dL — ABNORMAL HIGH (ref 70–99)
Glucose-Capillary: 192 mg/dL — ABNORMAL HIGH (ref 70–99)

## 2022-12-06 LAB — CULTURE, BLOOD (ROUTINE X 2): Special Requests: ADEQUATE

## 2022-12-06 LAB — CBC WITH DIFFERENTIAL/PLATELET
Abs Immature Granulocytes: 0.07 10*3/uL (ref 0.00–0.07)
Basophils Absolute: 0 10*3/uL (ref 0.0–0.1)
Basophils Relative: 0 %
Eosinophils Absolute: 0 10*3/uL (ref 0.0–0.5)
Eosinophils Relative: 0 %
HCT: 29.3 % — ABNORMAL LOW (ref 39.0–52.0)
Hemoglobin: 9.2 g/dL — ABNORMAL LOW (ref 13.0–17.0)
Immature Granulocytes: 1 %
Lymphocytes Relative: 10 %
Lymphs Abs: 0.5 10*3/uL — ABNORMAL LOW (ref 0.7–4.0)
MCH: 28.8 pg (ref 26.0–34.0)
MCHC: 31.4 g/dL (ref 30.0–36.0)
MCV: 91.8 fL (ref 80.0–100.0)
Monocytes Absolute: 0.4 10*3/uL (ref 0.1–1.0)
Monocytes Relative: 8 %
Neutro Abs: 3.9 10*3/uL (ref 1.7–7.7)
Neutrophils Relative %: 81 %
Platelets: 106 10*3/uL — ABNORMAL LOW (ref 150–400)
RBC: 3.19 MIL/uL — ABNORMAL LOW (ref 4.22–5.81)
RDW: 18.6 % — ABNORMAL HIGH (ref 11.5–15.5)
WBC: 4.9 10*3/uL (ref 4.0–10.5)
nRBC: 1.2 % — ABNORMAL HIGH (ref 0.0–0.2)

## 2022-12-06 LAB — COMPREHENSIVE METABOLIC PANEL
ALT: 19 U/L (ref 0–44)
AST: 31 U/L (ref 15–41)
Albumin: 1.9 g/dL — ABNORMAL LOW (ref 3.5–5.0)
Alkaline Phosphatase: 58 U/L (ref 38–126)
Anion gap: 12 (ref 5–15)
BUN: 98 mg/dL — ABNORMAL HIGH (ref 8–23)
CO2: 28 mmol/L (ref 22–32)
Calcium: 7.6 mg/dL — ABNORMAL LOW (ref 8.9–10.3)
Chloride: 95 mmol/L — ABNORMAL LOW (ref 98–111)
Creatinine, Ser: 7.5 mg/dL — ABNORMAL HIGH (ref 0.61–1.24)
GFR, Estimated: 7 mL/min — ABNORMAL LOW (ref >60)
Glucose, Bld: 202 mg/dL — ABNORMAL HIGH (ref 70–99)
Potassium: 5 mmol/L (ref 3.5–5.1)
Sodium: 135 mmol/L (ref 135–145)
Total Bilirubin: 0.9 mg/dL (ref 0.3–1.2)
Total Protein: 9.4 g/dL — ABNORMAL HIGH (ref 6.5–8.1)

## 2022-12-06 MED ORDER — STERILE WATER FOR INJECTION IJ SOLN
INTRAMUSCULAR | Status: AC
  Administered 2022-12-06: 10 mL
  Filled 2022-12-06: qty 10

## 2022-12-06 MED ORDER — HALOPERIDOL LACTATE 5 MG/ML IJ SOLN
5.0000 mg | Freq: Four times a day (QID) | INTRAMUSCULAR | Status: DC | PRN
  Administered 2022-12-06 – 2022-12-07 (×2): 5 mg via INTRAVENOUS
  Filled 2022-12-06 (×2): qty 1

## 2022-12-06 MED ORDER — HYDRALAZINE HCL 20 MG/ML IJ SOLN
10.0000 mg | Freq: Four times a day (QID) | INTRAMUSCULAR | Status: DC | PRN
  Administered 2022-12-06: 10 mg via INTRAVENOUS
  Filled 2022-12-06: qty 1

## 2022-12-06 MED ORDER — STERILE WATER FOR INJECTION IJ SOLN
INTRAMUSCULAR | Status: AC
  Administered 2022-12-06: 2.1 mL
  Filled 2022-12-06: qty 10

## 2022-12-06 MED ORDER — CHLORHEXIDINE GLUCONATE CLOTH 2 % EX PADS: 6.0000 | MEDICATED_PAD | Freq: Every day | CUTANEOUS | Status: DC

## 2022-12-06 MED ORDER — LORAZEPAM 2 MG/ML PO CONC
1.0000 mg | Freq: Once | ORAL | Status: DC
  Filled 2022-12-06: qty 0.5

## 2022-12-06 MED ORDER — LORAZEPAM 2 MG/ML IJ SOLN
1.0000 mg | Freq: Once | INTRAMUSCULAR | Status: AC
  Administered 2022-12-06: 1 mg via INTRAVENOUS
  Filled 2022-12-06: qty 1

## 2022-12-06 MED ORDER — OLANZAPINE 5 MG PO TBDP: 5.0000 mg | ORAL_TABLET | Freq: Every day | ORAL | Status: DC

## 2022-12-06 MED ORDER — LORAZEPAM 2 MG/ML IJ SOLN: 1.0000 mg | Freq: Once | INTRAMUSCULAR | Status: DC

## 2022-12-06 NOTE — Progress Notes (Signed)
Patient when awaken by lab tech was agitated and restless. Unable to draw lab at this time because patient was slapping, refusing care  and combative to lab tech, RN. Zyprexa IM was given and will continue to monitor.

## 2022-12-06 NOTE — Progress Notes (Addendum)
[facility] Progress Note   Subjective:   more agitation overnight- IM zyprexa needed; I/Os yest 2.9 / 1.5 UOP, net +3.6 for admission. BUN/Cr cont to trend down.  Started decadron 20 daily yest and onc was to discuss velcade chemo with him this AM per notes.   Wife bedside this AM.   Objective Vitals:   12/06/22 0504 12/06/22 0649 12/06/22 0800 12/06/22 1057  BP: (!) 174/90 (!) 179/75 (!) 189/91 (!) 215/91  Pulse: (!) 108 (!) 105 (!) 110   Resp: 20 15 14    Temp:   (!) 96.8 F (36 C)   TempSrc:   Axillary   SpO2: 95% 95% 96%   Weight:      Height:       Physical Exam Gen:  awake and alert in no distress ENT: MMM CV:  tachycardic without rub Abd:  soft, nontender Lungs: clear on RA GU: no foley Extr:  no edema Neuro: oriented to [place], self but otherwise mumbling incoherent answers, has mittens on and just asking over and over for them to be removed Skin: warm and dry  Additional Objective Labs: Basic Metabolic Panel: Recent Labs  Lab 12/05/22 1219 12/05/22 2122 12/06/22 0615  NA 133* 136 135  K 4.1 4.7 5.0  CL 93* 97* 95*  CO2 26 26 28   GLUCOSE 180* 135* 202*  BUN 105* 103* 98*  CREATININE 9.10* 8.48* 7.50*  CALCIUM 7.8* 7.6* 7.6*   Liver Function Tests: Recent Labs  Lab 12/04/22 0515 12/06/22 0615  AST 35 31  ALT 28 19  ALKPHOS 62 58  BILITOT 1.0 0.9  PROT >12.0* 9.4*  ALBUMIN 2.6* 1.9*   No results for input(s): "LIPASE", "AMYLASE" in the last 168 hours. CBC: Recent Labs  Lab 12/04/22 0827 12/04/22 0918 12/04/22 1635 12/05/22 0100 12/06/22 0932  WBC 4.4 5.7  --  4.1 4.9  NEUTROABS 2.9 3.1  --   --  PENDING  HGB 3.5* 3.5* 6.2* 7.4* 9.2*  HCT 11.8* 11.7* 19.4* 23.0* 29.3*  MCV 99.2 96.7  --  90.6 91.8  PLT 131* 137*  --  115* 106*   Blood Culture    Component Value Date/Time   SDES  12/04/2022 0630    BLOOD LEFT ANTECUBITAL Performed at [facility], [address], [place], [place] [address]     SPECREQUEST  12/04/2022 0630    BOTTLES DRAWN AEROBIC AND ANAEROBIC Blood Culture adequate volume Performed at [facility], [address], [place], [place] [address]    CULT  12/04/2022 0630    NO GROWTH 2 DAYS Performed at [facility], [address], [place], [place] [address]    REPTSTATUS PENDING 12/04/2022 0630    Cardiac Enzymes: Recent Labs  Lab 12/04/22 0515  CKTOTAL 66   CBG: Recent Labs  Lab 12/05/22 1227 12/05/22 1600 12/05/22 2043 12/06/22 0030 12/06/22 0447  GLUCAP 150* 83 132* 142* 192*   Iron Studies: No results for input(s): "IRON", "TIBC", "TRANSFERRIN", "FERRITIN" in the last 72 hours. @lablastinr3 @ Studies/Results: No results found. Medications:  cefTRIAXone (ROCEPHIN)  IV Stopped (12/05/22 1035)   lactated ringers Stopped (12/06/22 0507)    brimonidine  1 drop Both Eyes TID   Chlorhexidine Gluconate Cloth  6 each Topical Daily   dexamethasone  20 mg Oral Q breakfast   feeding supplement  1 Container Oral TID BM   ferrous sulfate  325 mg Oral QODAY   insulin aspart  0-15 Units Subcutaneous Q4H  latanoprost  1 drop Both Eyes QHS   levothyroxine  150 mcg Oral Q0600   pantoprazole (PROTONIX) IV  40 mg Intravenous Q12H   polyethylene glycol  17 g Oral Daily   sennosides  15 mL Oral BID   tamsulosin  0.4 mg Oral Daily    Assessment/Plan **AKI on CKD: recent baseline Cr ~1.5-1.7mg /dL > 4/54 3.4 > presented with Cr 11.7.   Suspect that the untreated myeloma is a major driving factor here for his AKI but certainly initially very dry.  Imaging shows no obstruction.  We'll pursue maximum medical therapy at this point but would  not consider him a candidate for dialysis in light of his advanced malignancy, comorbids, functional status (wheel chair bound and nearly bed bound recently) and would recommend against it.  He currently has no indications for dialysis at this time anyway.  Discussed with wife bedside today.  Having good  UOP and some improvement in labs --> cont max medical care for now.   Avoid nephrotoxins, dose meds for crcl < 10, maintain euvolemia --> on IVF still, continue for today.    **hyponatremia: resolved with fluids   **Metabolic acidosis: resolved with IV bicarb and AKI improvement   **Anemia:  Hb 3.5 initially, rec'd 2u pRBC and is 9.2 this AM   **Myeloma:  dx 09/2022 --> saw Dr. Candise Che and PET, chemo ordered; pt frustrated by IV issue at PET in early 10/2022 and left scan (he reports), hasn't started chemo --> onc has been consulted inpatient as has palliative.  Onc following and started decadron, considering velcade   **Possible sepsis:  on CTX. 1 of 2 blood cultures with staph thought to be contaminant and repeat cultures sent 7/31.   **HTN: BPs had been normal and elevated pressures track with agitation.  At most would use a relatively short acting PRN if needed but avoid hypotension in the setting of AKI.   Will follow, reach out if I can further assist.    Estill Bakes MD 12/06/2022, 11:06 AM  [facility] Pager: [phone]

## 2022-12-06 NOTE — Progress Notes (Signed)
PROGRESS NOTE    Scott Bass  XBM:[id number] DOB: June 26, 1950 DOA: 12/04/2022 PCP: Tracey Harries, MD   Brief Narrative:  This 72 years old wheelchair-bound male with PMH significant for blindness, recently diagnosed multiple myeloma in May 2024 but has not started therapy yet, hypertension, diabetes presented s/p fall and found to be confused, tachycardic and constipated with labs significant for AKI with hyperkalemia and severe metabolic acidosis.  He was also found to have hemoglobin 3.5, lactic acid 6.0.  Patient was initially admitted in the ICU and started on broad-spectrum antibiotics and fluid resuscitation.  Patient has received 3 units of packed red blood cells hemoglobin 7.5.  TRH pickup 12/06/2022.  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  Not a candidate for hemodialysis given advanced malignancy, comorbidity and functional status. Serum creatinine improving 11.76 > 11.06 >10.40 > 9.52> 9.10 >8.48 > 7.50  Hyperkalemia: Resolved with calcium gluconate, Lasix, insulin and D50.  Hyponatremia: resolved with IV hydration.  Metabolic acidosis: Resolved with sodium bicarbonate infusion.  Lactic acidosis > Improving. Continue IV fluid resuscitation.   Lactic acid improving 6.0> 2.7  Acute blood loss anemia likely secondary to multiple myeloma: S/p 2 unit PRBC.  Hemoglobin 7.4. Keep hemoglobin above 7.  Tachycardia: Likely multifactorial due to anemia, dehydration, suspected sepsis. Follow blood cultures.  Multiple myeloma with diffuse pathological fractures Mild  thrombocytopenia Oncology is consulted recommended Decadron and Velcade. Palliative consulted  Large mass involving T4 vertebral body with soft tissue component: MRI with and without C and T-spine when renal functions improves  Acute metabolic encephalopathy Likely secondary to multiple myeloma,  uremia and others. Minimize sedation,  reorient as above. Continue Haldol as needed for agitation and restlessness  Constipation: Aggressive  bowel regimen.  Hypothyroidism: Continue Synthroid.  DVT prophylaxis: SCDs Code Status: Full code Family Communication: No family at bed side. Disposition Plan:    Status is: Inpatient Remains inpatient appropriate because: Admitted for AKI on CKD stage IV in the setting of multiple myeloma not been treated. AKI is improving,  not a candidate for hemodialysis.   Consultants:  PCCM Nephrology  Procedures:None  Antimicrobials:  Anti-infectives (From admission, onward)    Start     Dose/Rate Route Frequency Ordered Stop   12/05/22 1000  cefTRIAXone (ROCEPHIN) 2 g in sodium chloride 0.9 % 100 mL IVPB        2 g 200 mL/hr over 30 Minutes Intravenous Every 24 hours 12/04/22 1340     12/04/22 0600  ceFEPIme (MAXIPIME) 2 g in sodium chloride 0.9 % 100 mL IVPB        2 g 200 mL/hr over 30 Minutes Intravenous  Once 12/04/22 0557 12/04/22 0726   12/04/22 0600  metroNIDAZOLE (FLAGYL) IVPB 500 mg        500 mg 100 mL/hr over 60 Minutes Intravenous  Once 12/04/22 0557 12/04/22 0829   12/04/22 0600  vancomycin (VANCOCIN) IVPB 1000 mg/200 mL premix        1,000 mg 200 mL/hr over 60 Minutes Intravenous  Once 12/04/22 0557 12/04/22 0918      Subjective: Patient was seen and examined  at bedside.  Overnight events noted.   Patient reports doing much better. He does have intermittent agitation and restlessness, requires intermittent Haldol.  Objective: Vitals:   12/06/22 0504 12/06/22 0649 12/06/22 0800 12/06/22 1057  BP: (!) 174/90 (!) 179/75 (!)  189/91 (!) 215/91  Pulse: (!) 108 (!) 105 (!) 110   Resp: 20 15 14    Temp:   (!) 96.8 F (36 C)   TempSrc:   Axillary   SpO2: 95% 95% 96%   Weight:      Height:        Intake/Output Summary (Last 24 hours) at 12/06/2022 1157 Last data filed at 12/06/2022 1146 Gross per 24 hour  Intake 2723.83 ml  Output 1925 ml  Net 798.83 ml   Filed Weights   12/04/22 1030 12/05/22 0500 12/06/22 0452  Weight: 69 kg 68.2 kg 69.3 kg    Examination:  General exam: Appears calm and comfortable, deconditioned, not in any acute distress. Respiratory system: Clear to auscultation. Respiratory effort normal. RR 15 Cardiovascular system: S1 & S2 heard, RRR. No JVD, murmurs, rubs, gallops or clicks. Gastrointestinal system: Abdomen is soft, non tender, non distended, bowel sounds present Central nervous system: Alert and oriented x 2. No focal neurological deficits. Extremities: No edema, no cyanosis, no clubbing  Skin: No rashes, lesions or ulcers Psychiatry:  Mood & affect appropriate.     Data Reviewed: I have personally reviewed following labs and imaging studies  CBC: Recent Labs  Lab 12/04/22 0827 12/04/22 0918 12/04/22 1635 12/05/22 0100 12/06/22 0932  WBC 4.4 5.7  --  4.1 4.9  NEUTROABS 2.9 3.1  --   --  3.9  HGB 3.5* 3.5* 6.2* 7.4* 9.2*  HCT 11.8* 11.7* 19.4* 23.0* 29.3*  MCV 99.2 96.7  --  90.6 91.8  PLT 131* 137*  --  115* 106*   Basic Metabolic Panel: Recent Labs  Lab 12/05/22 0100 12/05/22 0655 12/05/22 1219 12/05/22 2122 12/06/22 0615  NA 132* 132* 133* 136 135  K 3.9 3.9 4.1 4.7 5.0  CL 98 96* 93* 97* 95*  CO2 22 22 26 26 28   GLUCOSE 214* 154* 180* 135* 202*  BUN 113* 117* 105* 103* 98*  CREATININE 9.52* 9.54* 9.10* 8.48* 7.50*  CALCIUM 8.2* 8.2* 7.8* 7.6* 7.6*  MG 3.6*  --   --   --   --    GFR: Estimated Creatinine Clearance: 8.2 mL/min (A) (by C-G formula based on SCr of 7.5 mg/dL (H)). Liver Function Tests: Recent Labs  Lab 12/04/22 0515  12/06/22 0615  AST 35 31  ALT 28 19  ALKPHOS 62 58  BILITOT 1.0 0.9  PROT >12.0* 9.4*  ALBUMIN 2.6* 1.9*   No results for input(s): "LIPASE", "AMYLASE" in the last 168 hours. No results for input(s): "AMMONIA" in the last 168 hours. Coagulation Profile: Recent Labs  Lab 12/04/22 0515  INR 1.5*   Cardiac Enzymes: Recent Labs  Lab 12/04/22 0515  CKTOTAL 66   BNP (last 3 results) No results for input(s): "PROBNP" in the last 8760 hours. HbA1C: No results for input(s): "HGBA1C" in the last 72 hours. CBG: Recent Labs  Lab 12/05/22 1227 12/05/22 1600 12/05/22 2043 12/06/22 0030 12/06/22 0447  GLUCAP 150* 83 132* 142* 192*   Lipid Profile: No results for input(s): "CHOL", "HDL", "LDLCALC", "TRIG", "CHOLHDL", "LDLDIRECT" in the last 72 hours. Thyroid Function Tests: No results for input(s): "TSH", "T4TOTAL", "FREET4", "T3FREE", "THYROIDAB" in the last 72 hours. Anemia Panel: No results for input(s): "  VITAMINB12", "FOLATE", "FERRITIN", "TIBC", "IRON", "RETICCTPCT" in the last 72 hours. Sepsis Labs: Recent Labs  Lab 12/04/22 0529 12/04/22 0819 12/04/22 0918 12/05/22 1219 12/06/22 0615  PROCALCITON  --   --  2.17 5.01 3.72  LATICACIDVEN 6.0* 2.7*  --   --   --     Recent Results (from the past 240 hour(s))  Blood Culture (routine x 2)     Status: Abnormal   Collection Time: 12/04/22  6:16 AM   Specimen: BLOOD  Result Value Ref Range Status   Specimen Description   Final    BLOOD RIGHT ANTECUBITAL Performed at [facility], [address], [place], [place] [address]    Special Requests   Final    BOTTLES DRAWN AEROBIC AND ANAEROBIC Blood Culture adequate volume Performed at [facility], [address], [place], [place] [address]    Culture  Setup Time   Final    GRAM POSITIVE COCCI IN CLUSTERS AEROBIC BOTTLE ONLY CRITICAL RESULT CALLED TO, READ BACK BY AND VERIFIED WITH: PHARMD M. Doran Durand 12/05/22 @ 0604 BY AB     Culture (A)  Final    STAPHYLOCOCCUS CAPITIS THE SIGNIFICANCE OF ISOLATING THIS ORGANISM FROM A SINGLE SET OF BLOOD CULTURES WHEN MULTIPLE SETS ARE DRAWN IS UNCERTAIN. PLEASE NOTIFY THE MICROBIOLOGY DEPARTMENT WITHIN ONE WEEK IF SPECIATION AND SENSITIVITIES ARE REQUIRED. Performed at [facility], [address], [place], [place] [address]    Report Status 12/06/2022 FINAL  Final  Blood Culture ID Panel (Reflexed)     Status: Abnormal   Collection Time: 12/04/22  6:16 AM  Result Value Ref Range Status   Enterococcus faecalis NOT DETECTED NOT DETECTED Final   Enterococcus Faecium NOT DETECTED NOT DETECTED Final   Listeria monocytogenes NOT DETECTED NOT DETECTED Final   Staphylococcus species DETECTED (A) NOT DETECTED Final    Comment: CRITICAL RESULT CALLED TO, READ BACK BY AND VERIFIED WITH: PHARMD M. LILLISTON 12/05/22 @ 0604 BY AB    Staphylococcus aureus (BCID) NOT DETECTED NOT DETECTED Final   Staphylococcus epidermidis NOT DETECTED NOT DETECTED Final   Staphylococcus lugdunensis NOT DETECTED NOT DETECTED Final   Streptococcus species NOT DETECTED NOT DETECTED Final   Streptococcus agalactiae NOT DETECTED NOT DETECTED Final   Streptococcus pneumoniae NOT DETECTED NOT DETECTED Final   Streptococcus pyogenes NOT DETECTED NOT DETECTED Final   A.calcoaceticus-baumannii NOT DETECTED NOT DETECTED Final   Bacteroides fragilis NOT DETECTED NOT DETECTED Final   Enterobacterales NOT DETECTED NOT DETECTED Final   Enterobacter cloacae complex NOT DETECTED NOT DETECTED Final   Escherichia coli NOT DETECTED NOT DETECTED Final   Klebsiella aerogenes NOT DETECTED NOT DETECTED Final   Klebsiella oxytoca NOT DETECTED NOT DETECTED Final   Klebsiella pneumoniae NOT DETECTED NOT DETECTED Final   Proteus species NOT DETECTED NOT DETECTED Final   Salmonella species NOT DETECTED NOT DETECTED Final   Serratia marcescens NOT DETECTED NOT DETECTED Final   Haemophilus influenzae NOT DETECTED NOT  DETECTED Final   Neisseria meningitidis NOT DETECTED NOT DETECTED Final   Pseudomonas aeruginosa NOT DETECTED NOT DETECTED Final   Stenotrophomonas maltophilia NOT DETECTED NOT DETECTED Final   Candida albicans NOT DETECTED NOT DETECTED Final   Candida auris NOT DETECTED NOT DETECTED Final   Candida glabrata NOT DETECTED NOT DETECTED Final   Candida krusei NOT DETECTED NOT DETECTED Final   Candida parapsilosis NOT DETECTED NOT DETECTED Final   Candida tropicalis NOT DETECTED NOT DETECTED Final   Cryptococcus neoformans/gattii  NOT DETECTED NOT DETECTED Final    Comment: Performed at [facility], [address] [place], [place] [address]  Blood Culture (routine x 2)     Status: None (Preliminary result)   Collection Time: 12/04/22  6:30 AM   Specimen: BLOOD  Result Value Ref Range Status   Specimen Description   Final    BLOOD LEFT ANTECUBITAL Performed at [facility], [address], [place], [place] [address]    Special Requests   Final    BOTTLES DRAWN AEROBIC AND ANAEROBIC Blood Culture adequate volume Performed at [facility], [address] [place], [place] [address]    Culture   Final    NO GROWTH 2 DAYS Performed at [facility], [address] [place], [place] [address]    Report Status PENDING  Incomplete  Resp panel by RT-PCR (RSV, Flu A&B, Covid) Anterior Nasal Swab     Status: None   Collection Time: 12/04/22  8:15 AM   Specimen: Anterior Nasal Swab  Result Value Ref Range Status   SARS Coronavirus 2 by RT PCR NEGATIVE NEGATIVE Final    Comment: (NOTE) SARS-CoV-2 target nucleic acids are NOT DETECTED.  The SARS-CoV-2 RNA is generally detectable in upper respiratory specimens during the acute phase of infection. The lowest concentration of SARS-CoV-2 viral copies this assay can detect is 138 copies/mL. A negative result does not preclude SARS-Cov-2 infection and should not be used as the sole basis for  treatment or other patient management decisions. A negative result may occur with  improper specimen collection/handling, submission of specimen other than nasopharyngeal swab, presence of viral mutation(s) within the areas targeted by this assay, and inadequate number of viral copies(<138 copies/mL). A negative result must be combined with clinical observations, patient history, and epidemiological information. The expected result is Negative.  Fact Sheet for Patients:  BloggerCourse.com  Fact Sheet for Healthcare Providers:  SeriousBroker.it  This test is no t yet approved or cleared by the [place] FDA and  has been authorized for detection and/or diagnosis of SARS-CoV-2 by FDA under an Emergency Use Authorization (EUA). This EUA will remain  in effect (meaning this test can be used) for the duration of the COVID-19 declaration under Section 564(b)(1) of the Act, 21 U.S.C.section 360bbb-3(b)(1), unless the authorization is terminated  or revoked sooner.       Influenza A by PCR NEGATIVE NEGATIVE Final   Influenza B by PCR NEGATIVE NEGATIVE Final    Comment: (NOTE) The Xpert Xpress SARS-CoV-2/FLU/RSV plus assay is intended as an aid in the diagnosis of influenza from Nasopharyngeal swab specimens and should not be used as a sole basis for treatment. Nasal washings and aspirates are unacceptable for Xpert Xpress SARS-CoV-2/FLU/RSV testing.  Fact Sheet for Patients: BloggerCourse.com  Fact Sheet for Healthcare Providers: SeriousBroker.it  This test is not yet approved or cleared by the [place] FDA and has been authorized for detection and/or diagnosis of SARS-CoV-2 by FDA under an Emergency Use Authorization (EUA). This EUA will remain in effect (meaning this test can be used) for the duration of the COVID-19 declaration under Section 564(b)(1) of the Act, 21  U.S.C. section 360bbb-3(b)(1), unless the authorization is terminated or revoked.     Resp Syncytial Virus by PCR NEGATIVE NEGATIVE Final    Comment: (NOTE) Fact Sheet for Patients: BloggerCourse.com  Fact Sheet for Healthcare Providers: SeriousBroker.it  This test is not yet approved or cleared by the [place] FDA  and has been authorized for detection and/or diagnosis of SARS-CoV-2 by FDA under an Emergency Use Authorization (EUA). This EUA will remain in effect (meaning this test can be used) for the duration of the COVID-19 declaration under Section 564(b)(1) of the Act, 21 U.S.C. section 360bbb-3(b)(1), unless the authorization is terminated or revoked.  Performed at [facility], [address] [place], [place] [address]   MRSA Next Gen by PCR, Nasal     Status: None   Collection Time: 12/04/22 10:57 AM   Specimen: Nasal Mucosa; Nasal Swab  Result Value Ref Range Status   MRSA by PCR Next Gen NOT DETECTED NOT DETECTED Final    Comment: (NOTE) The GeneXpert MRSA Assay (FDA approved for NASAL specimens only), is one component of a comprehensive MRSA colonization surveillance program. It is not intended to diagnose MRSA infection nor to guide or monitor treatment for MRSA infections. Test performance is not FDA approved in patients less than 33 years old. Performed at [facility], [address], [place], [place] [address]    Radiology Studies: No results found.  Scheduled Meds:  brimonidine  1 drop Both Eyes TID   Chlorhexidine Gluconate Cloth  6 each Topical Daily   dexamethasone  20 mg Oral Q breakfast   feeding supplement  1 Container Oral TID BM   ferrous sulfate  325 mg Oral QODAY   insulin aspart  0-15 Units Subcutaneous Q4H   latanoprost  1 drop Both Eyes QHS   levothyroxine  150 mcg Oral Q0600   pantoprazole (PROTONIX) IV  40 mg Intravenous Q12H   polyethylene  glycol  17 g Oral Daily   sennosides  15 mL Oral BID   tamsulosin  0.4 mg Oral Daily   Continuous Infusions:  cefTRIAXone (ROCEPHIN)  IV 2 g (12/06/22 1124)   lactated ringers Stopped (12/06/22 0507)     LOS: 2 days    Time spent: 50 mins    Willeen Niece, MD [facility]   If 7PM-7AM, please contact night-coverage

## 2022-12-06 NOTE — Progress Notes (Signed)
IP PROGRESS NOTE  Subjective:   Scott Bass is alert and talkative this morning.  He was placed in restraints during the night secondary to agitation.  He denies pain.  Objective: Vital signs in last 24 hours: Blood pressure (!) 151/76, pulse (!) 116, temperature (!) 97.3 F (36.3 C), temperature source Axillary, resp. rate 11, height 5\' 6"  (1.676 m), weight 152 lb 12.5 oz (69.3 kg), SpO2 95%.  Intake/Output from previous day: 07/30 0701 - 07/31 0700 In: 2878.9 [I.V.:2778.9; IV Piggyback:100] Out: 1525 [Urine:1525]  Physical Exam: HEENT: No thrush Lungs: Clear anteriorly Cardiac: Regular rate and rhythm Abdomen: Soft, no hepatosplenomegaly, nontender Vascular: No leg edema Neurologic: Alert, oriented to year, follows commands, moves all extremities to command, aware of the myeloma diagnosis Extremities: No leg edema, mild edema of the left arm   Lab Results: Recent Labs    12/05/22 0100 12/06/22 0932  WBC 4.1 4.9  HGB 7.4* 9.2*  HCT 23.0* 29.3*  PLT 115* 106*    BMET Recent Labs    12/05/22 2122 12/06/22 0615  NA 136 135  K 4.7 5.0  CL 97* 95*  CO2 26 28  GLUCOSE 135* 202*  BUN 103* 98*  CREATININE 8.48* 7.50*  CALCIUM 7.6* 7.6*     Medications: I have reviewed the patient's current medications.  Assessment/Plan: Multiple myeloma, IgG kappa, diagnosed May 2024-untreated CTs 12/04/2022-innumerable lytic lesions, large expansile lesion in the anterior right second rib, multiple pathologic rib fractures, expansile T4 (T3 on CT cervical spine) mass with soft tissue component extending into the central canal and left neural foramen, C7 transverse process fracture Decadron daily x 4 starting 12/05/2022 2.  Acute/chronic renal failure 3.  Severe anemia 4.  Mild thrombocytopenia 5.  Diabetes 6.  Hypertension 7.  Glaucoma 8.  OSA  Scott Bass was recently diagnosed with multiple myeloma.  He has not been treated for myeloma.  He was admitted after a fall and  was found to have severe anemia and renal failure.  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  He does not have neurologic symptoms suggestive of a cord compression syndrome.     Scott Bass has hospital delirium.  He was oriented to place and diagnosis when I saw him this morning.  He began treatment with Decadron yesterday.  I recommend adding Velcade.  We reviewed potential toxicities associated with Velcade including the chance of nausea, neuropathy, hematologic toxicity, infection, bleeding, rash, and neuropathy.  He agrees to proceed.  He understands the goal of chemotherapy is to palliate his symptoms, help prevent neurologic compromise, and extend survival.  Treatment will not be curative.  The creatinine is better following intravenous hydration. The renal failure is likely related to dehydration in the setting of chronic renal insufficiency, and multiple myeloma.  I discussed the case with his wife by telephone at approximately 2 PM today.  I reviewed his current status and treatment options.  We discussed the Velcade/Decadron regimen.  She understands the potential toxicities and goals of treatment.  She also agrees for him to begin Velcade chemotherapy.  The plan is to add daratumumab as an outpatient and we may consider adding cyclophosphamide over the next few days.  Recommendations: Intravenous hydration Continue course of pulse Decadron Daily CBC and chemistry panel Management of renal failure per nephrology Velcade chemotherapy to begin on 12/07/2022 Limit sedating medications as possible      LOS: 2 days   Thornton Papas, MD  12/06/2022, 2:02 PM

## 2022-12-06 NOTE — Progress Notes (Signed)
Daily Progress Note   Patient Name: Scott Bass       Date: 12/06/2022 DOB: 02-Aug-1950  Age: 72 y.o. MRN#: [id number] Attending Physician: Willeen Niece, MD Primary Care Physician: Tracey Harries, MD Admit Date: 12/04/2022 Length of Stay: 2 days  Reason for Consultation/Follow-up: Establishing goals of care  Subjective:   CC: Patient remains confused at time sin bed though appears less agitated. Following up regarding complex medical decision making.   Subjective:  Reviewed EMR prior to presenting to bedside.  Patient's creatinine went down to 7.5 with IV fluids.  Patient's albumin noted to be at 1.9.  Patient received olanzapine 2.5 mg x 1 in past 24 hours for agitation.  Patient also received IV Dilaudid 0.5 mg x 2 doses and past 24 hours at time of EMR review.  Presented to bedside to see patient.  Patient laying in bed in mittens.  Patient more awake and interactive though still confused.  Patient hyper focused on mittens.  Patient's wife present at bedside.  Able to introduce myself and the role of the palliative medicine team in patient's care.  Spent time discussing patient's medical journey up into this point.  Noted nephrologist had already stopped by and discussed how patient is not a dialysis candidate.  Agreed with the appropriateness of not being a candidate for dialysis due to patient's functionally limited state. Wife noted she has not talked to the oncologist today.  Discussed prior recommendations oncologist had provided as per EMR review.  Noted importance of taking things day by day to determine how patient will be moving forward as never want to cause more harm than good in patient's medical care.  Wife agreeing with this.  Inquired if wife and patient had ever discussed wishes regarding medical care or had advance care planning.  Wife notes that they do not have documentation in place and have not extensively discussed wishes regarding medical care.  Explained that in  the state of [place] if patient is unable to answer medical questions regarding his own care, wife is his Insurance underwriter.  With permission, able to discuss CODE STATUS at that time.  Reviewed full code versus DNR, continue appropriate medical therapies.  Expressed concern that if patient was sick and if his heart were to stop or he would stop breathing, interventions such as cardiac resuscitation and intubation with mechanical ventilation would not lead to quality time for patient.  Wife acknowledges that she noted she saw a sister suffer from a stroke previously and the difficulties with being on a ventilator.  Wife noted she will consider this regarding patient's CODE STATUS.  Patient to remain full code at this time.  Also inquired about help at home for patient and wife since they are both blind.  Wife notes that they occasionally have neighbors who can assist with providing them support.  Does not sound there is a consistent source of support to obtain basic needs for living.  Wife expressed consideration of maybe needing to hire someone to help him.  Noted would discussed with RN and potential have social work involved. Answered all questions at that time.  Thanked patient and his wife for allowing me to visit with them today.  Discussed with RN who noted social worker has already been consulted due to psychosocial support concerns.  Review of Systems Confused though calmer Objective:   Vital Signs:  BP (!) 189/91 (BP Location: Left Leg)   Pulse (!) 110   Temp 97.8 F (36.6 C) (  Axillary)   Resp 14   Ht 5\' 6"  (1.676 m)   Wt 69.3 kg   SpO2 96%   BMI 24.66 kg/m   Physical Exam: General: NAD, confused at times, chronically ill appearing  Eyes: No drainage noted HENT: Dry mucous membranes Cardiovascular: Tachycardia noted Respiratory: no increased work of breathing noted, not in respiratory distress Abdomen: not distended Neuro: Confused  Imaging:  I personally  reviewed recent imaging.   Assessment & Plan:   Assessment: Patient is a 72 year old male with a past medical history of wheelchair-bound status, legally blind, CKD, GERD, OSA, hypertension, diabetes mellitus, and recent diagnosis of multiple myeloma who was admitted on 12/04/2022 after a fall at home.  Upon admission, patient was found to have AKI on CKD, encephalopathy, and multiple electrolyte imbalances.  Patient seen by oncology and nephrology during hospitalization.  Patient was supposed to follow-up with oncology on 6/12 though missed this appointment and so did not begin therapies for his multiple myeloma.  Palliative medicine team consulted to assist with complex medical decision making   Recommendations/Plan: # Complex medical decision making/goals of care:  - Patient unable to participate in complex medical decision-making due to current medical status.  -Discussed care at length with patient's wife who was at bedside as described in detail above in HPI.  Discussed current pathway for medical care.  Did express need to continue open conversations with providers should patient's medical status deteriorate.  Wife appreciates continued open conversations regarding patient's care.  -  Code Status: Full Code   -Discussed code status at length with wife at bedside today. Explained full code vs DNR, continue appropriate medical care.  Expressed concern that if patient was sick and if his heart were to stop or he were to stop breathing, interventions such as cardiac resuscitation and intubation with mechanical ventilation would not lead to quality of life moving forward with his multiple medical conditions.  Wife to consider this. # Symptom management:    -As per PCCM   # Psychosocial Support:  -Wife  -Concerned about patient's support at home. Wife reports she and patient live alone. They are both blind. Wife reports some neighbors help at times though does not sound like there is consistent  help to basic needs for living. Discussed with RN who reports SW is being involved.   # Discharge Planning: To Be Determined  Discussed with: patient, patient's wife, RN  Thank you for allowing the palliative care team to participate in the care [name].  Alvester Morin, DO Palliative Care Provider PMT # [phone]  If patient remains symptomatic despite maximum doses, please call PMT at [phone] between 0700 and 1900. Outside of these hours, please call attending, as PMT does not have night coverage.  This provider spent a total of 53 minutes providing patient's care.  Includes review of EMR, discussing care with other staff members involved in patient's medical care, obtaining relevant history and information from patient and/or patient's family, and personal review of imaging and lab work. Greater than 50% of the time was spent counseling and coordinating care related to the above assessment and plan.    *Please note that this is a verbal dictation therefore any spelling or grammatical errors are due to the "Dragon [employer] One" system interpretation.

## 2022-12-06 NOTE — Evaluation (Signed)
Clinical/Bedside Swallow Evaluation Patient Details  Name: ASHA SEARS MRN: [id number] Date of Birth: [birth year]  Today's Date: 12/06/2022 Time: SLP Start Time (ACUTE ONLY): 1306 SLP Stop Time (ACUTE ONLY): 1323 SLP Time Calculation (min) (ACUTE ONLY): 17 min  Past Medical History:  Past Medical History:  Diagnosis Date   Arthritis    "lower back" (12/18/2017)   Better eye: moderate vision impairment; lesser eye: blind    GERD (gastroesophageal reflux disease)    Glaucoma, both eyes    High cholesterol    Hypertension    Hypothyroidism    Irregular heartbeat    Legally blind    "both eyes; can see some out of left eye"   OSA (obstructive sleep apnea)    Thyroid disease    Type II diabetes mellitus (HCC)    Vitamin B12 deficiency    Past Surgical History:  Past Surgical History:  Procedure Laterality Date   LEFT HEART CATH AND CORONARY ANGIOGRAPHY N/A 12/18/2017   Procedure: LEFT HEART CATH AND CORONARY ANGIOGRAPHY;  Surgeon: Elder Negus, MD;  Location: [facility] IN[facility]AS[facility];  Service: Cardiovascular;  Laterality: N/A;   NOSE SURGERY     "was crooked; they straightened it out" (12/18/2017)   HPI:  Briefly, 72 year old wheel chair bound male with blindness, recently diagnosed multiple myeloma in May 2024 but not has started therapy, HTN, DM who presented for fall and found confused, tachycardic and constipated with labs significant for AKI with hyperkalemia and severe metabolic acidosis and Hg 3.5. LA 6.0. PCCM consulted for admission. In the ED started on broad spectrum abx and fluid resuscitation; CT chest indicated No acute findings within the chest, abdomen or pelvis. No  evidence of solid organ injury. No hemorrhage or edema within the  mediastinum. No pleural effusion or pneumothorax ; ST consulted for swallow evaluation d/t dysphagia noted with liquids per nursing report.    Assessment / Plan / Recommendation  Clinical Impression  Pt seen for clinical swallow  evaluation with primarily cognitive-based oropharyngeal dysphagia c/b oral holding, prolonged oral manipulation/propulsion, impaired mastication and inattention impacting oral/pharyngeal intake/clearance.  Pt coughed x1 with successive straw sips of thin, but otherwise consumed thin, ice chips, puree and soft solids without overt s/sx of aspiration.  Pt distracted by mitts and stating "I am being held against my will."  Hx of cognitive impairment likely exacerbated by encephalopathic state.  Pt able to state name and portion of DOB, but otherwise, not oriented to place/situation or time.  Wife stated he eats softer consistencies at home, but has had episodes of coughing infrequently at home.  Nursing reported coughing with liquids which initiated swallow evaluation.  Recommend initiating a Dysphagia 2/thin liquid diet with aspiration/swallowing precautions posted in pt room.  Pt requires FULL supervision and A with meals as he is legally blind and cognition is impaired at baseline, but exacerbated since hospitalization.  ST will f/u for diet tolerance/education during acute stay.  Thank you for this consult. SLP Visit Diagnosis: Dysphagia, oropharyngeal phase (R13.12)    Aspiration Risk  Mild aspiration risk    Diet Recommendation   Thin;Dysphagia 2 (chopped)  Medication Administration: Whole meds with liquid (or whole via puree if difficulty arises with liquid/whole consumption)    Other  Recommendations Oral Care Recommendations: Oral care BID;Staff/trained caregiver to provide oral care    Recommendations for follow up therapy are one component of a multi-disciplinary discharge planning process, led by the attending physician.  Recommendations may be updated based  on patient status, additional functional criteria and insurance authorization.  Follow up Recommendations Follow physician's recommendations for discharge plan and follow up therapies      Assistance Recommended at Discharge  FULL   Functional Status Assessment Patient has had a recent decline in their functional status and demonstrates the ability to make significant improvements in function in a reasonable and predictable amount of time.  Frequency and Duration min 2x/week  1 week       Prognosis Prognosis for improved oropharyngeal function: Good Barriers to Reach Goals: Cognitive deficits      Swallow Study   General Date of Onset: 12/04/22 HPI: Briefly, 72 year old wheel chair bound male with blindness, recently diagnosed multiple myeloma in May 2024 but not has started therapy, HTN, DM who presented for fall and found confused, tachycardic and constipated with labs significant for AKI with hyperkalemia and severe metabolic acidosis and Hg 3.5. LA 6.0. PCCM consulted for admission. In the ED started on broad spectrum abx and fluid resuscitation; CT chest indicated No acute findings within the chest, abdomen or pelvis. No  evidence of solid organ injury. No hemorrhage or edema within the  mediastinum. No pleural effusion or pneumothorax ; ST consulted for swallow evaluation d/t dysphagia noted with liquids per nursing report. Type of Study: Bedside Swallow Evaluation Previous Swallow Assessment: n/a Diet Prior to this Study: Thin liquids (Level 0) Temperature Spikes Noted: No Respiratory Status: Room air History of Recent Intubation: No Behavior/Cognition: Alert;Agitated;Confused;Distractible Oral Cavity Assessment: Within Functional Limits Oral Care Completed by SLP: Recent completion by staff Oral Cavity - Dentition: Adequate natural dentition;Missing dentition Vision: Impaired for self-feeding Self-Feeding Abilities: Total assist Patient Positioning: Upright in bed Baseline Vocal Quality: Normal Volitional Cough: Strong Volitional Swallow: Unable to elicit    Oral/Motor/Sensory Function Overall Oral Motor/Sensory Function: Other (comment) (DTA; generalized oral weakness)   Ice Chips Ice chips:  Impaired Presentation: Spoon Oral Phase Functional Implications: Oral holding Pharyngeal Phase Impairments: Suspected delayed Swallow   Thin Liquid Thin Liquid: Impaired Presentation: Cup;Spoon;Straw Oral Phase Functional Implications: Oral holding Pharyngeal  Phase Impairments: Suspected delayed Swallow;Cough - Immediate    Nectar Thick Nectar Thick Liquid: Not tested   Honey Thick Honey Thick Liquid: Not tested   Puree Puree: Impaired Presentation: Spoon Oral Phase Functional Implications: Oral holding;Prolonged oral transit Pharyngeal Phase Impairments: Suspected delayed Swallow   Solid     Solid: Impaired Presentation: Spoon Oral Phase Functional Implications: Oral holding;Impaired mastication;Prolonged oral transit Pharyngeal Phase Impairments: Suspected delayed Swallow      Pat Damyan Corne,M.S., CCC-SLP 12/06/2022,1:36 PM

## 2022-12-07 DIAGNOSIS — R410 Disorientation, unspecified: Secondary | ICD-10-CM | POA: Diagnosis not present

## 2022-12-07 DIAGNOSIS — R4589 Other symptoms and signs involving emotional state: Secondary | ICD-10-CM | POA: Diagnosis not present

## 2022-12-07 DIAGNOSIS — Z7189 Other specified counseling: Secondary | ICD-10-CM | POA: Diagnosis not present

## 2022-12-07 DIAGNOSIS — Z66 Do not resuscitate: Secondary | ICD-10-CM

## 2022-12-07 DIAGNOSIS — N184 Chronic kidney disease, stage 4 (severe): Secondary | ICD-10-CM | POA: Diagnosis not present

## 2022-12-07 DIAGNOSIS — C9000 Multiple myeloma not having achieved remission: Secondary | ICD-10-CM | POA: Diagnosis not present

## 2022-12-07 DIAGNOSIS — N179 Acute kidney failure, unspecified: Secondary | ICD-10-CM | POA: Diagnosis not present

## 2022-12-07 DIAGNOSIS — N170 Acute kidney failure with tubular necrosis: Secondary | ICD-10-CM | POA: Diagnosis not present

## 2022-12-07 DIAGNOSIS — Z515 Encounter for palliative care: Secondary | ICD-10-CM | POA: Diagnosis not present

## 2022-12-07 LAB — CBC WITH DIFFERENTIAL/PLATELET
Abs Immature Granulocytes: 0.06 10*3/uL (ref 0.00–0.07)
Basophils Absolute: 0 10*3/uL (ref 0.0–0.1)
Basophils Relative: 0 %
Eosinophils Absolute: 0 10*3/uL (ref 0.0–0.5)
Eosinophils Relative: 0 %
HCT: 26.4 % — ABNORMAL LOW (ref 39.0–52.0)
Hemoglobin: 8.2 g/dL — ABNORMAL LOW (ref 13.0–17.0)
Immature Granulocytes: 1 %
Lymphocytes Relative: 10 %
Lymphs Abs: 0.5 10*3/uL — ABNORMAL LOW (ref 0.7–4.0)
MCH: 28.7 pg (ref 26.0–34.0)
MCHC: 31.1 g/dL (ref 30.0–36.0)
MCV: 92.3 fL (ref 80.0–100.0)
Monocytes Absolute: 0.6 10*3/uL (ref 0.1–1.0)
Monocytes Relative: 11 %
Neutro Abs: 4 10*3/uL (ref 1.7–7.7)
Neutrophils Relative %: 78 %
Platelets: 116 10*3/uL — ABNORMAL LOW (ref 150–400)
RBC: 2.86 MIL/uL — ABNORMAL LOW (ref 4.22–5.81)
RDW: 18.6 % — ABNORMAL HIGH (ref 11.5–15.5)
WBC: 5.1 10*3/uL (ref 4.0–10.5)
nRBC: 1.2 % — ABNORMAL HIGH (ref 0.0–0.2)

## 2022-12-07 LAB — GLUCOSE, CAPILLARY
Glucose-Capillary: 144 mg/dL — ABNORMAL HIGH (ref 70–99)
Glucose-Capillary: 147 mg/dL — ABNORMAL HIGH (ref 70–99)
Glucose-Capillary: 150 mg/dL — ABNORMAL HIGH (ref 70–99)
Glucose-Capillary: 152 mg/dL — ABNORMAL HIGH (ref 70–99)
Glucose-Capillary: 152 mg/dL — ABNORMAL HIGH (ref 70–99)
Glucose-Capillary: 155 mg/dL — ABNORMAL HIGH (ref 70–99)
Glucose-Capillary: 168 mg/dL — ABNORMAL HIGH (ref 70–99)

## 2022-12-07 LAB — BASIC METABOLIC PANEL
Anion gap: 13 (ref 5–15)
BUN: 111 mg/dL — ABNORMAL HIGH (ref 8–23)
CO2: 26 mmol/L (ref 22–32)
Calcium: 7.5 mg/dL — ABNORMAL LOW (ref 8.9–10.3)
Chloride: 101 mmol/L (ref 98–111)
Creatinine, Ser: 6.78 mg/dL — ABNORMAL HIGH (ref 0.61–1.24)
GFR, Estimated: 8 mL/min — ABNORMAL LOW (ref >60)
Glucose, Bld: 146 mg/dL — ABNORMAL HIGH (ref 70–99)
Potassium: 4.1 mmol/L (ref 3.5–5.1)
Sodium: 140 mmol/L (ref 135–145)

## 2022-12-07 LAB — PHOSPHORUS: Phosphorus: 5.8 mg/dL — ABNORMAL HIGH (ref 2.5–4.6)

## 2022-12-07 LAB — MAGNESIUM: Magnesium: 3.7 mg/dL — ABNORMAL HIGH (ref 1.7–2.4)

## 2022-12-07 LAB — PROCALCITONIN: Procalcitonin: 2.42 ng/mL

## 2022-12-07 MED ORDER — MELATONIN 5 MG PO TABS
5.0000 mg | ORAL_TABLET | Freq: Every evening | ORAL | Status: AC | PRN
  Administered 2022-12-07 – 2022-12-08 (×2): 5 mg via ORAL
  Filled 2022-12-07 (×2): qty 1

## 2022-12-07 MED ORDER — BORTEZOMIB CHEMO SQ INJECTION 3.5 MG (2.5MG/ML)
1.5000 mg/m2 | Freq: Once | INTRAMUSCULAR | Status: AC
  Administered 2022-12-07: 2.75 mg via SUBCUTANEOUS
  Filled 2022-12-07: qty 1.1

## 2022-12-07 MED ORDER — STERILE WATER FOR INJECTION IJ SOLN
INTRAMUSCULAR | Status: AC
  Administered 2022-12-07: 10 mL
  Filled 2022-12-07: qty 10

## 2022-12-07 MED ORDER — HALOPERIDOL LACTATE 5 MG/ML IJ SOLN
5.0000 mg | Freq: Three times a day (TID) | INTRAMUSCULAR | Status: DC
  Administered 2022-12-07 (×2): 5 mg via INTRAVENOUS
  Filled 2022-12-07 (×3): qty 1

## 2022-12-07 MED ORDER — CHLORHEXIDINE GLUCONATE CLOTH 2 % EX PADS
6.0000 | MEDICATED_PAD | Freq: Every day | CUTANEOUS | Status: DC
  Administered 2022-12-07 – 2022-12-12 (×6): 6 via TOPICAL

## 2022-12-07 MED ORDER — AMLODIPINE BESYLATE 5 MG PO TABS
5.0000 mg | ORAL_TABLET | Freq: Every day | ORAL | Status: DC
  Administered 2022-12-07 – 2022-12-09 (×3): 5 mg via ORAL
  Filled 2022-12-07 (×3): qty 1

## 2022-12-07 NOTE — Progress Notes (Signed)
Pharmacy Antibiotic Note  Scott Bass is a 72 y.o. male admitted on 12/04/2022 with  with CKD, blindness, WC bound, HTN, DM recent diagnosis of multiple myeloma who presented to the ED after a fall at home.  Nephrology consulted for evaluation and management of AKI on CKD accompanied by hyperkalemia and acidosis.  Pharmacy has been consulted for ceftriaxone dosing for possible UTI.     Plan: Ceftriaxone 2 g IV q24h Duration: 5 day per Dr. Idelle Leech   Height: 5\' 6"  (167.6 cm) Weight: 68 kg (149 lb 14.6 oz) IBW/kg (Calculated) : 63.8  Temp (24hrs), Avg:97.4 F (36.3 C), Min:96.8 F (36 C), Max:98.1 F (36.7 C)  Recent Labs  Lab 12/04/22 0529 12/04/22 0819 12/04/22 0827 12/04/22 0918 12/04/22 1635 12/05/22 0100 12/05/22 0655 12/05/22 1219 12/05/22 2122 12/06/22 0615 12/06/22 0932 12/07/22 0327 12/07/22 0501  WBC  --   --  4.4 5.7  --  4.1  --   --   --   --  4.9 5.1  --   CREATININE  --   --   --  11.06*   < > 9.52* 9.54* 9.10* 8.48* 7.50*  --   --  6.78*  LATICACIDVEN 6.0* 2.7*  --   --   --   --   --   --   --   --   --   --   --    < > = values in this interval not displayed.    Estimated Creatinine Clearance: 9 mL/min (A) (by C-G formula based on SCr of 6.78 mg/dL (H)).    Allergies  Allergen Reactions   Hctz [Hydrochlorothiazide] Other (See Comments)    Pancreatitis     Antimicrobials this admission: 7/29 cefepime 2g IV, Flagyl, vancomycin 1g IV x  1 each 7/30 ceftriaxone >> (8/2)  Microbiology results: 7/29 BCx: 1/4 bottles + staph capitis 7/29 BCID: staph species  7/29 MRSA PCR: not detected 7/29 UA: rare bacteria, 0-5 WBC, 0-5 epithelial cells 7/31 BCx: ngtd  Thank you for allowing pharmacy to be a part of this patient's care.  Lynann Beaver PharmD, BCPS [facility] main pharmacy [phone] 12/07/2022 8:57 AM

## 2022-12-07 NOTE — TOC Initial Note (Signed)
Transition of Care [facility]) - Initial/Assessment Note    Patient Details  Name: Scott Bass MRN: [id number] Date of Birth: Jul 21, 1950  [facility]) CM/SW Contact:    Coralyn Helling, LCSW Phone Number: 12/07/2022, 11:25 AM  Clinical Narrative:      [facility] consulted for HH/DME needs.  TOC spoke with patient spouse, Bonita Quin by phone. Bonita Quin reports patient uses a walker at baseline and is independent in ADLs. She reports that they use public transport such as [employer], [employer], and [employer] for transportation. She reports they do not have issues with transportation, but sometimes they miss appointments due to transportation being late.  Denies issues getting medications and groceries, but reports "sometimes the food runs out". Bonita Quin reports they do not receive SNAP benefits and have never applied.   Bonita Quin reports patient could benefit from a bedside commode and shower chair at dc. She also reports an aide would be helpful at his current level of function. Please consider maxing out [facility] if patient dc home. TOC will follow for dc needs.  PLAN: TBD    Expected Discharge Plan: Home w Home Health Services Barriers to Discharge: Continued Medical Work up   Patient Goals and CMS Choice Patient states their goals for this hospitalization and ongoing recovery are:: Go home          Expected Discharge Plan and Services In-house Referral: Clinical Social Work     Living arrangements for the past 2 months: Single Family Home                                      Prior Living Arrangements/Services Living arrangements for the past 2 months: Single Family Home Lives with:: Spouse Patient language and need for interpreter reviewed:: Yes        Need for Family Participation in Patient Care: Yes (Comment) Care giver support system in place?: Yes (comment) Current home services: DME Criminal Activity/Legal Involvement Pertinent to Current Situation/Hospitalization: No - Comment as  needed  Activities of Daily Living Home Assistive Devices/Equipment: Walker (specify type) ADL Screening (condition at time of admission) Patient's cognitive ability adequate to safely complete daily activities?: No Is the patient deaf or have difficulty hearing?: No Does the patient have difficulty seeing, even when wearing glasses/contacts?: Yes Does the patient have difficulty concentrating, remembering, or making decisions?: Yes Patient able to express need for assistance with ADLs?: No Does the patient have difficulty dressing or bathing?: Yes Independently performs ADLs?: No Communication: Needs assistance Is this a change from baseline?: Pre-admission baseline Dressing (OT): Needs assistance Is this a change from baseline?: Pre-admission baseline Grooming: Needs assistance Is this a change from baseline?: Pre-admission baseline Feeding: Independent Bathing: Independent Toileting: Independent In/Out Bed: Independent Walks in Home: Independent with device (comment) (walker) Does the patient have difficulty walking or climbing stairs?: Yes Weakness of Legs: Both Weakness of Arms/Hands: Both  Permission Sought/Granted Permission sought to share information with : Family Supports Permission granted to share information with : Yes, Verbal Permission Granted  Share Information with NAME: Lucienne Minks           Emotional Assessment Appearance:: Appears stated age Attitude/Demeanor/Rapport: Unable to Assess Affect (typically observed): Unable to Assess Orientation: : Oriented to Self, Oriented to  Time Alcohol / Substance Use: Not Applicable Psych Involvement: No (comment)  Admission diagnosis:  Hyperkalemia [E87.5] Lactic acidosis [E87.20] Acute on chronic renal failure (HCC) [N17.9, N18.9]  Symptomatic anemia [D64.9] Acute renal failure, unspecified acute renal failure type [facility]) [N17.9] Patient Active Problem List   Diagnosis Date Noted   Need for emotional support  12/06/2022   Palliative care encounter 12/05/2022   Lactic acidosis 12/05/2022   Counseling and coordination of care 12/05/2022   Goals of care, counseling/discussion 12/05/2022   Acute renal failure (HCC) 12/05/2022   Acute on chronic renal failure (HCC) 12/04/2022   Multiple myeloma without remission (HCC) 10/11/2022   Counseling regarding advance care planning and goals of care 10/11/2022   Hypercalcemia 09/25/2022   Claudication (HCC) 07/15/2018   Nonischemic cardiomyopathy (HCC) 07/15/2018   Abnormal stress test 12/17/2017   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 [place] [address] Phone: [phone] Fax: [phone]  [facility] - [place], [place] - [address] [place] [address] Phone: [phone] Fax: [phone]     Social Determinants of Health (SDOH) Social History: SDOH Screenings   Food Insecurity: Food Insecurity Present (12/06/2022)  Housing: Low Risk  (12/06/2022)  Transportation Needs: No Transportation Needs (12/06/2022)  Utilities: Not At Risk (12/06/2022)  Financial Resource Strain: Low Risk  (10/10/2022)   Received from [facility], [facility]  Physical Activity: Insufficiently Active (12/23/2020)   Received from [facility], [facility]  Social Connections: Unknown (09/16/2021)   Received from [facility], [facility]  Stress: No Stress Concern Present (12/23/2020)   Received from [facility]  Tobacco Use: Low Risk  (12/06/2022)   SDOH Interventions:     Readmission Risk Interventions    12/07/2022   11:22 AM   Readmission Risk Prevention Plan  HRI or Home Care Consult Complete  SW Recovery Care/Counseling Consult Complete

## 2022-12-07 NOTE — Progress Notes (Signed)
PROGRESS NOTE    Scott Bass  ZOX:[id number] DOB: 10/16/1950 DOA: 12/04/2022 PCP: Tracey Harries, MD   Brief Narrative:  This 72 years old wheelchair-bound male with PMH significant for blindness, recently diagnosed multiple myeloma in May 2024 but has not started therapy yet, hypertension, diabetes presented s/p fall and found to be confused, tachycardic and constipated with labs significant for AKI with hyperkalemia and severe metabolic acidosis.  He was also found to have hemoglobin 3.5, lactic acid 6.0.  Patient was initially admitted in the ICU and started on broad-spectrum antibiotics and fluid resuscitation.  Patient has received 3 units of packed red blood cells hemoglobin 7.5.  TRH pickup 12/06/2022.  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  Not a candidate for hemodialysis given advanced malignancy, comorbidity and functional status. Serum creatinine improving 11.76 > 11.06 >10.40 > 9.52> 9.10 >8.48 > 7.50 >6.78 Continue IV fluid resuscitation.  Hyperkalemia: Resolved with calcium gluconate, Lasix, insulin and D50.  Hyponatremia: resolved with IV hydration.  Metabolic acidosis: Resolved with sodium bicarbonate infusion.  Lactic acidosis > Improving. Continue IV fluid resuscitation.   Lactic acid improving 6.0> 2.7  Acute blood loss anemia likely secondary to multiple myeloma: S/p 2 unit PRBC.  Hemoglobin 7.4 >8.2 Keep hemoglobin above 7.  Tachycardia: Likely multifactorial due to anemia, dehydration, suspected sepsis. Follow blood  cultures.  Multiple myeloma with diffuse pathological fractures Mild thrombocytopenia: Oncology is consulted recommended Decadron and Velcade. Palliative consulted to discuss goals of care.  Large mass involving T4 vertebral body with soft tissue component: MRI with and without C and T-spine when renal functions improves.  Acute metabolic encephalopathy Likely secondary to multiple myeloma, uremia and others. Minimize sedation,  reorient as above. Continue Haldol as needed for agitation and restlessness. Psychiatry consulted for agitation, restlessness.   Constipation: Aggressive bowel regimen.  Hypothyroidism: Continue Synthroid.  DVT prophylaxis: SCDs Code Status: Full code Family Communication: No family at bed side. Disposition Plan:    Status is: Inpatient Remains inpatient appropriate because: Admitted for AKI on CKD stage IV in the setting of multiple myeloma,  not been treated. AKI is improving,  not a candidate for hemodialysis.   Consultants:  PCCM Nephrology  Procedures:None  Antimicrobials:  Anti-infectives (From admission, onward)    Start     Dose/Rate Route Frequency Ordered Stop   12/05/22 1000  cefTRIAXone (ROCEPHIN) 2 g in sodium chloride 0.9 % 100 mL IVPB        2 g 200 mL/hr over 30 Minutes Intravenous Every 24 hours 12/04/22 1340     12/04/22 0600  ceFEPIme (MAXIPIME) 2 g in sodium chloride 0.9 % 100 mL IVPB        2 g 200 mL/hr over 30 Minutes Intravenous  Once 12/04/22 0557 12/04/22 0726   12/04/22 0600  metroNIDAZOLE (FLAGYL) IVPB 500 mg        500 mg 100 mL/hr over 60 Minutes Intravenous  Once 12/04/22 0557 12/04/22 0829   12/04/22 0600  vancomycin (VANCOCIN) IVPB 1000 mg/200 mL premix        1,000 mg 200  mL/hr over 60 Minutes Intravenous  Once 12/04/22 0557 12/04/22 4540      Subjective: Patient was seen and examined at bedside. Overnight events noted.  Patient seems much improved today. He is alert and oriented,  following commands.   Very cooperative. He does have intermittent agitation and restlessness, requires intermittent Haldol.  Objective: Vitals:   12/07/22 0727 12/07/22 0800 12/07/22 0825 12/07/22 1000  BP:   (!) 157/89 (!) 172/79  Pulse: 77  96 98  Resp: 10  13 13   Temp: 97.7 F (36.5 C) 98.1 F (36.7 C)    TempSrc: Axillary Axillary    SpO2: 99%  95% 93%  Weight:      Height:        Intake/Output Summary (Last 24 hours) at 12/07/2022 1049 Last data filed at 12/07/2022 1000 Gross per 24 hour  Intake 2087.03 ml  Output 1790 ml  Net 297.03 ml   Filed Weights   12/05/22 0500 12/06/22 0452 12/07/22 0500  Weight: 68.2 kg 69.3 kg 68 kg    Examination:  General exam: Appears comfortable, deconditioned, not in any acute distress. Respiratory system: CTA bilaterally. Respiratory effort normal. RR 13 Cardiovascular system: S1 & S2 heard, RRR. No JVD, murmurs, rubs, gallops or clicks. Gastrointestinal system: Abdomen is soft, non tender, non distended, bowel sounds present Central nervous system: Alert and oriented x 2. No focal neurological deficits. Extremities: No edema, no cyanosis, no clubbing  Skin: No rashes, lesions or ulcers Psychiatry:  Mood & affect appropriate.     Data Reviewed: I have personally reviewed following labs and imaging studies  CBC: Recent Labs  Lab 12/04/22 0827 12/04/22 0918 12/04/22 1635 12/05/22 0100 12/06/22 0932 12/07/22 0327  WBC 4.4 5.7  --  4.1 4.9 5.1  NEUTROABS 2.9 3.1  --   --  3.9 4.0  HGB 3.5* 3.5* 6.2* 7.4* 9.2* 8.2*  HCT 11.8* 11.7* 19.4* 23.0* 29.3* 26.4*  MCV 99.2 96.7  --  90.6 91.8 92.3  PLT 131* 137*  --  115* 106* 116*   Basic Metabolic Panel: Recent Labs  Lab 12/05/22 0100 12/05/22 0655 12/05/22 1219 12/05/22 2122 12/06/22 0615 12/07/22 0501  NA 132* 132* 133* 136 135 140  K 3.9 3.9 4.1 4.7 5.0 4.1  CL 98 96* 93* 97* 95* 101  CO2 22 22 26 26 28 26   GLUCOSE 214* 154* 180* 135* 202* 146*  BUN 113* 117* 105* 103* 98* 111*   CREATININE 9.52* 9.54* 9.10* 8.48* 7.50* 6.78*  CALCIUM 8.2* 8.2* 7.8* 7.6* 7.6* 7.5*  MG 3.6*  --   --   --   --  3.7*  PHOS  --   --   --   --   --  5.8*   GFR: Estimated Creatinine Clearance: 9 mL/min (A) (by C-G formula based on SCr of 6.78 mg/dL (H)). Liver Function Tests: Recent Labs  Lab 12/04/22 0515 12/06/22 0615  AST 35 31  ALT 28 19  ALKPHOS 62 58  BILITOT 1.0 0.9  PROT >12.0* 9.4*  ALBUMIN 2.6* 1.9*   No results for input(s): "LIPASE", "AMYLASE" in the last 168 hours. No results for input(s): "AMMONIA" in the last 168 hours. Coagulation Profile: Recent Labs  Lab 12/04/22 0515  INR 1.5*   Cardiac Enzymes: Recent Labs  Lab 12/04/22 0515  CKTOTAL 66   BNP (last 3 results) No results for input(s): "PROBNP" in the last 8760 hours. HbA1C: No results for input(s): "HGBA1C" in the last 72 hours. CBG: Recent  Labs  Lab 12/06/22 1538 12/06/22 2032 12/07/22 0043 12/07/22 0326 12/07/22 0749  GLUCAP 142* 163* 152* 147* 144*   Lipid Profile: No results for input(s): "CHOL", "HDL", "LDLCALC", "TRIG", "CHOLHDL", "LDLDIRECT" in the last 72 hours. Thyroid Function Tests: No results for input(s): "TSH", "T4TOTAL", "FREET4", "T3FREE", "THYROIDAB" in the last 72 hours. Anemia Panel: No results for input(s): "VITAMINB12", "FOLATE", "FERRITIN", "TIBC", "IRON", "RETICCTPCT" in the last 72 hours. Sepsis Labs: Recent Labs  Lab 12/04/22 0529 12/04/22 0819 12/04/22 0918 12/05/22 1219 12/06/22 0615 12/07/22 0501  PROCALCITON  --   --  2.17 5.01 3.72 2.42  LATICACIDVEN 6.0* 2.7*  --   --   --   --     Recent Results (from the past 240 hour(s))  Blood Culture (routine x 2)     Status: Abnormal   Collection Time: 12/04/22  6:16 AM   Specimen: BLOOD  Result Value Ref Range Status   Specimen Description   Final    BLOOD RIGHT ANTECUBITAL Performed at [facility], [address] [place], [place] [address]    Special Requests   Final     BOTTLES DRAWN AEROBIC AND ANAEROBIC Blood Culture adequate volume Performed at [facility], [address], [place], [place] [address]    Culture  Setup Time   Final    GRAM POSITIVE COCCI IN CLUSTERS AEROBIC BOTTLE ONLY CRITICAL RESULT CALLED TO, READ BACK BY AND VERIFIED WITH: PHARMD M. Doran Durand 12/05/22 @ 0604 BY AB    Culture (A)  Final    STAPHYLOCOCCUS CAPITIS THE SIGNIFICANCE OF ISOLATING THIS ORGANISM FROM A SINGLE SET OF BLOOD CULTURES WHEN MULTIPLE SETS ARE DRAWN IS UNCERTAIN. PLEASE NOTIFY THE MICROBIOLOGY DEPARTMENT WITHIN ONE WEEK IF SPECIATION AND SENSITIVITIES ARE REQUIRED. Performed at [facility], [address] [place] [address], [place], [place] [address]    Report Status 12/06/2022 FINAL  Final  Blood Culture ID Panel (Reflexed)     Status: Abnormal   Collection Time: 12/04/22  6:16 AM  Result Value Ref Range Status   Enterococcus faecalis NOT DETECTED NOT DETECTED Final   Enterococcus Faecium NOT DETECTED NOT DETECTED Final   Listeria monocytogenes NOT DETECTED NOT DETECTED Final   Staphylococcus species DETECTED (A) NOT DETECTED Final    Comment: CRITICAL RESULT CALLED TO, READ BACK BY AND VERIFIED WITH: PHARMD M. LILLISTON 12/05/22 @ 0604 BY AB    Staphylococcus aureus (BCID) NOT DETECTED NOT DETECTED Final   Staphylococcus epidermidis NOT DETECTED NOT DETECTED Final   Staphylococcus lugdunensis NOT DETECTED NOT DETECTED Final   Streptococcus species NOT DETECTED NOT DETECTED Final   Streptococcus agalactiae NOT DETECTED NOT DETECTED Final   Streptococcus pneumoniae NOT DETECTED NOT DETECTED Final   Streptococcus pyogenes NOT DETECTED NOT DETECTED Final   A.calcoaceticus-baumannii NOT DETECTED NOT DETECTED Final   Bacteroides fragilis NOT DETECTED NOT DETECTED Final   Enterobacterales NOT DETECTED NOT DETECTED Final   Enterobacter cloacae complex NOT DETECTED NOT DETECTED Final   Escherichia coli NOT DETECTED NOT DETECTED Final   Klebsiella  aerogenes NOT DETECTED NOT DETECTED Final   Klebsiella oxytoca NOT DETECTED NOT DETECTED Final   Klebsiella pneumoniae NOT DETECTED NOT DETECTED Final   Proteus species NOT DETECTED NOT DETECTED Final   Salmonella species NOT DETECTED NOT DETECTED Final   Serratia marcescens NOT DETECTED NOT DETECTED Final   Haemophilus influenzae NOT DETECTED NOT DETECTED Final   Neisseria meningitidis NOT DETECTED NOT DETECTED Final   Pseudomonas aeruginosa NOT DETECTED NOT DETECTED  Final   Stenotrophomonas maltophilia NOT DETECTED NOT DETECTED Final   Candida albicans NOT DETECTED NOT DETECTED Final   Candida auris NOT DETECTED NOT DETECTED Final   Candida glabrata NOT DETECTED NOT DETECTED Final   Candida krusei NOT DETECTED NOT DETECTED Final   Candida parapsilosis NOT DETECTED NOT DETECTED Final   Candida tropicalis NOT DETECTED NOT DETECTED Final   Cryptococcus neoformans/gattii NOT DETECTED NOT DETECTED Final    Comment: Performed at [facility], [address] [place], [place] [address]  Blood Culture (routine x 2)     Status: None (Preliminary result)   Collection Time: 12/04/22  6:30 AM   Specimen: BLOOD  Result Value Ref Range Status   Specimen Description   Final    BLOOD LEFT ANTECUBITAL Performed at [facility], [address], [place], [place] [address]    Special Requests   Final    BOTTLES DRAWN AEROBIC AND ANAEROBIC Blood Culture adequate volume Performed at [facility], [address] [place], [place] [address]    Culture   Final    NO GROWTH 3 DAYS Performed at [facility], [address], [place], [place] [address]    Report Status PENDING  Incomplete  Resp panel by RT-PCR (RSV, Flu A&B, Covid) Anterior Nasal Swab     Status: None   Collection Time: 12/04/22  8:15 AM   Specimen: Anterior Nasal Swab  Result Value Ref Range Status   SARS Coronavirus 2 by RT PCR NEGATIVE NEGATIVE Final    Comment: (NOTE) SARS-CoV-2  target nucleic acids are NOT DETECTED.  The SARS-CoV-2 RNA is generally detectable in upper respiratory specimens during the acute phase of infection. The lowest concentration of SARS-CoV-2 viral copies this assay can detect is 138 copies/mL. A negative result does not preclude SARS-Cov-2 infection and should not be used as the sole basis for treatment or other patient management decisions. A negative result may occur with  improper specimen collection/handling, submission of specimen other than nasopharyngeal swab, presence of viral mutation(s) within the areas targeted by this assay, and inadequate number of viral copies(<138 copies/mL). A negative result must be combined with clinical observations, patient history, and epidemiological information. The expected result is Negative.  Fact Sheet for Patients:  BloggerCourse.com  Fact Sheet for Healthcare Providers:  SeriousBroker.it  This test is no t yet approved or cleared by the [place] FDA and  has been authorized for detection and/or diagnosis of SARS-CoV-2 by FDA under an Emergency Use Authorization (EUA). This EUA will remain  in effect (meaning this test can be used) for the duration of the COVID-19 declaration under Section 564(b)(1) of the Act, 21 U.S.C.section 360bbb-3(b)(1), unless the authorization is terminated  or revoked sooner.       Influenza A by PCR NEGATIVE NEGATIVE Final   Influenza B by PCR NEGATIVE NEGATIVE Final    Comment: (NOTE) The Xpert Xpress SARS-CoV-2/FLU/RSV plus assay is intended as an aid in the diagnosis of influenza from Nasopharyngeal swab specimens and should not be used as a sole basis for treatment. Nasal washings and aspirates are unacceptable for Xpert Xpress SARS-CoV-2/FLU/RSV testing.  Fact Sheet for Patients: BloggerCourse.com  Fact Sheet for Healthcare  Providers: SeriousBroker.it  This test is not yet approved or cleared by the [place] FDA and has been authorized for detection and/or diagnosis of SARS-CoV-2 by FDA under an Emergency Use Authorization (EUA). This EUA will remain in effect (meaning this test can be  used) for the duration of the COVID-19 declaration under Section 564(b)(1) of the Act, 21 U.S.C. section 360bbb-3(b)(1), unless the authorization is terminated or revoked.     Resp Syncytial Virus by PCR NEGATIVE NEGATIVE Final    Comment: (NOTE) Fact Sheet for Patients: BloggerCourse.com  Fact Sheet for Healthcare Providers: SeriousBroker.it  This test is not yet approved or cleared by the [place] FDA and has been authorized for detection and/or diagnosis of SARS-CoV-2 by FDA under an Emergency Use Authorization (EUA). This EUA will remain in effect (meaning this test can be used) for the duration of the COVID-19 declaration under Section 564(b)(1) of the Act, 21 U.S.C. section 360bbb-3(b)(1), unless the authorization is terminated or revoked.  Performed at [facility], [address] [place], [place] [address]   MRSA Next Gen by PCR, Nasal     Status: None   Collection Time: 12/04/22 10:57 AM   Specimen: Nasal Mucosa; Nasal Swab  Result Value Ref Range Status   MRSA by PCR Next Gen NOT DETECTED NOT DETECTED Final    Comment: (NOTE) The GeneXpert MRSA Assay (FDA approved for NASAL specimens only), is one component of a comprehensive MRSA colonization surveillance program. It is not intended to diagnose MRSA infection nor to guide or monitor treatment for MRSA infections. Test performance is not FDA approved in patients less than 10 years old. Performed at [facility], [address] [place], [place] [address]   Culture, blood (Routine X 2) w Reflex to ID Panel     Status: None  (Preliminary result)   Collection Time: 12/06/22  6:15 AM   Specimen: BLOOD  Result Value Ref Range Status   Specimen Description   Final    BLOOD BLOOD RIGHT ARM Performed at [facility], [address], [place], [place] [address]    Special Requests   Final    BOTTLES DRAWN AEROBIC ONLY Blood Culture adequate volume Performed at [facility], [address], [place], [place] [address]    Culture   Final    NO GROWTH < 24 HOURS Performed at [facility], [address], [place], [place] [address]    Report Status PENDING  Incomplete  Culture, blood (Routine X 2) w Reflex to ID Panel     Status: None (Preliminary result)   Collection Time: 12/06/22  6:30 AM   Specimen: BLOOD  Result Value Ref Range Status   Specimen Description   Final    BLOOD BLOOD LEFT FOREARM Performed at [facility], [address], [place], [place] [address]    Special Requests   Final    BOTTLES DRAWN AEROBIC ONLY Blood Culture adequate volume Performed at [facility], [address] [place], [place] [address]    Culture   Final    NO GROWTH < 24 HOURS Performed at [facility], [address] [place], [place] [address]    Report Status PENDING  Incomplete   Radiology Studies: No results found.  Scheduled Meds:  brimonidine  1 drop Both Eyes TID   Chlorhexidine Gluconate Cloth  6 each Topical Q2200   dexamethasone  20 mg Oral Q breakfast   feeding supplement  1 Container Oral TID BM   ferrous sulfate  325 mg Oral QODAY   haloperidol lactate  5 mg Intravenous TID   insulin aspart  0-15 Units Subcutaneous Q4H   latanoprost  1 drop Both Eyes QHS  levothyroxine  150 mcg Oral Q0600   pantoprazole (PROTONIX) IV  40 mg Intravenous Q12H   polyethylene glycol  17 g Oral Daily   sennosides  15 mL Oral BID   tamsulosin  0.4 mg Oral Daily   Continuous Infusions:  cefTRIAXone (ROCEPHIN)  IV Stopped (12/07/22  1034)   lactated ringers 125 mL/hr at 12/07/22 1028     LOS: 3 days    Time spent: 35 mins    Willeen Niece, MD [facility]   If 7PM-7AM, please contact night-coverage

## 2022-12-07 NOTE — Progress Notes (Signed)
Patient acknowledged treatment plan and confirmed consent. Wife, Edder Thomlinson signed the consent form with 2 witnesses. Velcade injection administered. Patient tolerated well. Patient and wife confirm they will need transportation assistance to the cancer center to continue treatments. This RN to message provider and collab RN to help coordinate for future appointments.

## 2022-12-07 NOTE — Progress Notes (Signed)
MD good to treat with velcade today inpt.  He will look to add acyclovir as outpt RX.  Dec 20 for nausea and has zofran PRN.  Will also look to get phenotype as inpt with anticipation of dara as outpt treatment.

## 2022-12-07 NOTE — Plan of Care (Signed)
  Problem: Clinical Measurements: Goal: Ability to maintain clinical measurements within normal limits will improve Outcome: Progressing Goal: Cardiovascular complication will be avoided Outcome: Progressing   Problem: Coping: Goal: Level of anxiety will decrease Outcome: Progressing   Problem: Education: Goal: Knowledge of General Education information will improve Description: Including pain rating scale, medication(s)/side effects and non-pharmacologic comfort measures Outcome: Not Progressing   Problem: Nutrition: Goal: Adequate nutrition will be maintained Outcome: Not Progressing

## 2022-12-07 NOTE — Progress Notes (Signed)
[facility] Progress Note   Subjective:   to start velcade today per chart. I/Os yest 1.6 / 1.45 all UOP.  Sleepy this AM but per RN doing much better.   Did not eat breakfast - fluids were off all day due to agitation, resumed this AM.  Objective Vitals:   12/07/22 0630 12/07/22 0727 12/07/22 0800 12/07/22 0825  BP:    (!) 157/89  Pulse: (!) 107 77  96  Resp: 11 10  13   Temp:  97.7 F (36.5 C) 98.1 F (36.7 C)   TempSrc:  Axillary Axillary   SpO2: 98% 99%  95%  Weight:      Height:       Physical Exam Gen:  sleeping comfortably, in no distress ENT: MMM CV:  RRR without rub Abd:  soft, nontender Lungs: clear on RA GU: foley draining clear yellow urine Extr:  no edema Neuro: sleeping Skin: warm and dry  Additional Objective Labs: Basic Metabolic Panel: Recent Labs  Lab 12/05/22 2122 12/06/22 0615 12/07/22 0501  NA 136 135 140  K 4.7 5.0 4.1  CL 97* 95* 101  CO2 26 28 26   GLUCOSE 135* 202* 146*  BUN 103* 98* 111*  CREATININE 8.48* 7.50* 6.78*  CALCIUM 7.6* 7.6* 7.5*  PHOS  --   --  5.8*   Liver Function Tests: Recent Labs  Lab 12/04/22 0515 12/06/22 0615  AST 35 31  ALT 28 19  ALKPHOS 62 58  BILITOT 1.0 0.9  PROT >12.0* 9.4*  ALBUMIN 2.6* 1.9*   No results for input(s): "LIPASE", "AMYLASE" in the last 168 hours. CBC: Recent Labs  Lab 12/04/22 0827 12/04/22 0918 12/04/22 1635 12/05/22 0100 12/06/22 0932 12/07/22 0327  WBC 4.4 5.7  --  4.1 4.9 5.1  NEUTROABS 2.9 3.1  --   --  3.9 4.0  HGB 3.5* 3.5*   < > 7.4* 9.2* 8.2*  HCT 11.8* 11.7*   < > 23.0* 29.3* 26.4*  MCV 99.2 96.7  --  90.6 91.8 92.3  PLT 131* 137*  --  115* 106* 116*   < > = values in this interval not displayed.   Blood Culture    Component Value Date/Time   SDES  12/06/2022 0630    BLOOD BLOOD LEFT FOREARM Performed at [facility], [address] [place], [place] [address]    SPECREQUEST  12/06/2022 0630    BOTTLES DRAWN AEROBIC ONLY  Blood Culture adequate volume Performed at [facility], [address], [place], [place] [address]    CULT  12/06/2022 0630    NO GROWTH < 24 HOURS Performed at [facility], [address], [place], [place] [address]    REPTSTATUS PENDING 12/06/2022 0630    Cardiac Enzymes: Recent Labs  Lab 12/04/22 0515  CKTOTAL 66   CBG: Recent Labs  Lab 12/06/22 1538 12/06/22 2032 12/07/22 0043 12/07/22 0326 12/07/22 0749  GLUCAP 142* 163* 152* 147* 144*   Iron Studies: No results for input(s): "IRON", "TIBC", "TRANSFERRIN", "FERRITIN" in the last 72 hours. @lablastinr3 @ Studies/Results: No results found. Medications:  cefTRIAXone (ROCEPHIN)  IV 2 g (12/07/22 0930)   lactated ringers 125 mL/hr at 12/07/22 0800    brimonidine  1 drop Both Eyes TID   Chlorhexidine Gluconate Cloth  6 each Topical Q2200   dexamethasone  20 mg Oral Q breakfast   feeding supplement  1 Container Oral TID BM   ferrous sulfate  325 mg Oral QODAY  haloperidol lactate  5 mg Intravenous TID   insulin aspart  0-15 Units Subcutaneous Q4H   latanoprost  1 drop Both Eyes QHS   levothyroxine  150 mcg Oral Q0600   pantoprazole (PROTONIX) IV  40 mg Intravenous Q12H   polyethylene glycol  17 g Oral Daily   sennosides  15 mL Oral BID   tamsulosin  0.4 mg Oral Daily    Assessment/Plan **AKI on CKD: recent baseline Cr ~1.5-1.7mg /dL > 4/40 3.4 > presented with Cr 11.7.   Suspect that the untreated myeloma is a major driving factor here for his AKI but certainly initially very dry.  Imaging shows no obstruction.  We'll pursue maximum medical therapy at this point but would  not consider him a candidate for dialysis in light of his advanced malignancy, comorbids, functional status (wheel chair bound and nearly bed bound recently) and would recommend against it.  He currently has no indications for dialysis at this time anyway.  Discussed with wife no dialysis.  Having good UOP and some  improvement in Cr --> cont max medical care for now.  BUN likely up a bit related to steroids.  Avoid nephrotoxins, dose meds for crcl < 10, maintain euvolemia. D/C IVF today.    **Anemia:  Hb 3.5 initially, rec'd 2u pRBC and is 8.2 this AM   **Myeloma:  dx 09/2022 --> saw Dr. Candise Che and PET, chemo ordered; pt frustrated by IV issue at PET in early 10/2022 and left scan (he reports), hasn't started chemo --> onc has been consulted inpatient as has palliative.  Onc following and started decadron, velcade today   **Possible sepsis:  on CTX. 1 of 2 blood cultures with staph thought to be contaminant and repeat cultures sent 7/31 NGTD   **HTN: BP remains elevated, start amlodipine 5   Will follow, reach out if I can further assist.    Estill Bakes MD 12/07/2022, 10:24 AM  [facility] Pager: [phone]

## 2022-12-07 NOTE — Progress Notes (Signed)
Daily Progress Note   Patient Name: Scott Bass       Date: 12/07/2022 DOB: [birth year]  Age: 72 y.o. MRN#: [id number] Attending Physician: Willeen Niece, MD Primary Care Physician: Tracey Harries, MD Admit Date: 12/04/2022 Length of Stay: 3 days  Reason for Consultation/Follow-up: Establishing goals of care  Subjective:   CC: Patient calmly laying in bed sleeping. Following up regarding complex medical decision making.   Subjective:  Reviewed EMR prior to presenting to bedside.   Discussed care with bedside RN for updates.  Presented to bedside to meet with patient.  Patient sleeping comfortably in the bed at this time.  Patient out of mittens.  Patient's wife present at bedside.  Reintroduced myself as a member of the palliative medicine team and wife voiced appreciation for stopping by she wanted to continue our discussions from yesterday.  Wife noted that she considered our CODE STATUS discussion and she noted that patient would not want to be put on life support and would want to be allowed to have a natural death.  Again reviewed full code versus DNR.  Wife noting that patient would want his CODE STATUS changed to DNR, continue appropriate medical care at this time.  Noted would change CODE STATUS in EMR and signed DNR form to be on patient's chart.  Wife voiced appreciation for this assistance.  Then spent time discussing patient's medical care at this time.  Plan is for patient to start chemotherapy while in the hospital.  Wife inquiring about support moving forward such as physical therapy evaluation, durable medical equipment for the house, and making sure patient maintains eating and drinking.  Spent time discussing need to continue reevaluations every day to determine where patient's body is that so we can best support in the appropriate medical manner.  Wife also expressed concern about patient "choking" while eating.  Discussed involvement of SLP for dysphagia and concerns about  aspiration.  Hope that as patient becomes more interactive, can participate more with an advanced diet.  Did express concern about overall weakened status which can lead to recurrent musculature including with swallowing abilities.  Spent time providing emotional support via active listening.  Noted would continue to discuss care with team to best provide support for patient and wife.  Wife voiced appreciation for visit and discussion today.  Noted palliative medicine team will continue to follow along with patient's medical journey.  Discussed care with IDT after visit.  Review of Systems resting comfortably Objective:   Vital Signs:  BP (!) 157/89 (BP Location: Right Arm)   Pulse 96   Temp 98.1 F (36.7 C) (Axillary)   Resp 13   Ht 5\' 6"  (1.676 m)   Wt 68 kg   SpO2 95%   BMI 24.20 kg/m   Physical Exam: General: NAD, resting, chronically ill appearing  Eyes: No drainage noted HENT: Dry mucous membranes Cardiovascular: Tachycardia noted Respiratory: no increased work of breathing noted, not in respiratory distress Abdomen: not distended Neuro: calm, will awaken to touch  Imaging:  I personally reviewed recent imaging.   Assessment & Plan:   Assessment: Patient is a 72 year old male with a past medical history of wheelchair-bound status, legally blind, CKD, GERD, OSA, hypertension, diabetes mellitus, and recent diagnosis of multiple myeloma who was admitted on 12/04/2022 after a fall at home.  Upon admission, patient was found to have AKI on CKD, encephalopathy, and multiple electrolyte imbalances.  Patient seen by oncology and nephrology during hospitalization.  Patient was supposed to  follow-up with oncology on 6/12 though missed this appointment and so did not begin therapies for his multiple myeloma.  Palliative medicine team consulted to assist with complex medical decision making   Recommendations/Plan: # Complex medical decision making/goals of care:  - Continuing open  conversations between patient, wife and providers regarding appropraite medical interventions moving forward.d Planning to initiate chemotherapy at this time.   -  Code Status: DNR   -Again discussed code status at length with wife at bedside today. Wife states that patient would not desire to be on life support as aligned with who he sis as a person. Patient would want to be allowed to have a natural death. Agreeing with code status change to DNR, continue appropriate medical care at this time. Placed signed DNR form on patient's paper chart.   # Symptom management:    -As per PCCM   # Psychosocial Support:  -Wife  -Concerned about patient's support at home. Wife reports she and patient live alone. They are both blind. Wife reports some neighbors help at times though does not sound like there is consistent help to basic needs for living. SW being involved.   # Discharge Planning: To Be Determined  Discussed with: patient, patient's wife, RN, hospitalist, [facility]  Thank you for allowing the palliative care team to participate in the care Scott Bass.  Alvester Morin, DO Palliative Care Provider PMT # [phone]  If patient remains symptomatic despite maximum doses, please call PMT at [phone] between 0700 and 1900. Outside of these hours, please call attending, as PMT does not have night coverage.  *Please note that this is a verbal dictation therefore any spelling or grammatical errors are due to the "[employer]" system interpretation.

## 2022-12-07 NOTE — Progress Notes (Addendum)
0730 patient alert to self and date able to make needs known bilateral mitts removed 0900 wife arrived at bedside wife also blind was brought in by [facility] and placed in chair at bedside  1420 patient coughing a lot with any drink of thin liquids and unable to swallow food without difficulty that is not puree. Speech re consulted for patient safety

## 2022-12-07 NOTE — Plan of Care (Signed)
  Problem: Education: Goal: Knowledge of General Education information will improve Description: Including pain rating scale, medication(s)/side effects and non-pharmacologic comfort measures Outcome: Not Progressing   Problem: Health Behavior/Discharge Planning: Goal: Ability to manage health-related needs will improve Outcome: Not Progressing   Problem: Clinical Measurements: Goal: Ability to maintain clinical measurements within normal limits will improve Outcome: Not Progressing Goal: Will remain free from infection Outcome: Not Progressing Goal: Diagnostic test results will improve Outcome: Not Progressing Goal: Respiratory complications will improve Outcome: Not Progressing Goal: Cardiovascular complication will be avoided Outcome: Not Progressing   Problem: Activity: Goal: Risk for activity intolerance will decrease Outcome: Not Progressing   Problem: Nutrition: Goal: Adequate nutrition will be maintained Outcome: Not Progressing   Problem: Coping: Goal: Level of anxiety will decrease Outcome: Not Progressing   Problem: Elimination: Goal: Will not experience complications related to bowel motility Outcome: Not Progressing Goal: Will not experience complications related to urinary retention Outcome: Not Progressing   Problem: Pain Managment: Goal: General experience of comfort will improve Outcome: Not Progressing   Problem: Safety: Goal: Ability to remain free from injury will improve Outcome: Not Progressing   Problem: Skin Integrity: Goal: Risk for impaired skin integrity will decrease Outcome: Not Progressing   Problem: Education: Goal: Ability to describe self-care measures that may prevent or decrease complications (Diabetes Survival Skills Education) will improve Outcome: Not Progressing Goal: Individualized Educational Video(s) Outcome: Not Progressing   Problem: Fluid Volume: Goal: Ability to maintain a balanced intake and output will  improve Outcome: Not Progressing   Problem: Health Behavior/Discharge Planning: Goal: Ability to identify and utilize available resources and services will improve Outcome: Not Progressing Goal: Ability to manage health-related needs will improve Outcome: Not Progressing   Problem: Metabolic: Goal: Ability to maintain appropriate glucose levels will improve Outcome: Not Progressing   Problem: Nutritional: Goal: Maintenance of adequate nutrition will improve Outcome: Not Progressing Goal: Progress toward achieving an optimal weight will improve Outcome: Not Progressing   Problem: Skin Integrity: Goal: Risk for impaired skin integrity will decrease Outcome: Not Progressing   Problem: Tissue Perfusion: Goal: Adequacy of tissue perfusion will improve Outcome: Not Progressing

## 2022-12-07 NOTE — Progress Notes (Signed)
IP PROGRESS NOTE  Subjective:   [name] is alert. He denies pain.  He requests water.  Objective: Vital signs in last 24 hours: Blood pressure (!) 172/79, pulse 98, temperature 98.1 F (36.7 C), temperature source Axillary, resp. rate 13, height 5\' 6"  (1.676 m), weight 149 lb 14.6 oz (68 kg), SpO2 93%.  Intake/Output from previous day: 07/31 0701 - 08/01 0700 In: 1634.5 [I.V.:1518.4; IV Piggyback:96.2] Out: 1450 [Urine:1450]  Physical Exam: HEENT: No thrush, the mouth is dry with dried secretions over the palate and tongue  Abdomen: Soft, no hepatosplenomegaly, nontender Vascular: No leg edema Neurologic: Alert, oriented to year, place, and diagnosis.  Moves all extremities to command. Extremities: No leg edema   Lab Results: Recent Labs    12/06/22 0932 12/07/22 0327  WBC 4.9 5.1  HGB 9.2* 8.2*  HCT 29.3* 26.4*  PLT 106* 116*    BMET Recent Labs    12/06/22 0615 12/07/22 0501  NA 135 140  K 5.0 4.1  CL 95* 101  CO2 28 26  GLUCOSE 202* 146*  BUN 98* 111*  CREATININE 7.50* 6.78*  CALCIUM 7.6* 7.5*     Medications: I have reviewed the patient's current medications.  Assessment/Plan: Multiple myeloma, IgG kappa, diagnosed May 2024-untreated CTs 12/04/2022-innumerable lytic lesions, large expansile lesion in the anterior right second rib, multiple pathologic rib fractures, expansile T4 (T3 on CT cervical spine) mass with soft tissue component extending into the central canal and left neural foramen, C7 transverse process fracture Decadron daily x 4 starting 12/05/2022 Cycle 1 day 1 Velcade 12/07/2022 2.  Acute/chronic renal failure 3.  Severe anemia 4.  Mild thrombocytopenia 5.  Diabetes 6.  Hypertension 7.  Glaucoma 8.  OSA  [name] was recently diagnosed with multiple myeloma.  He has not been treated for myeloma.  He was admitted after a fall and was found to have severe anemia and renal failure.  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  He does not have neurologic symptoms suggestive of a cord compression syndrome.     [name] is alert and oriented this morning.  The plan is to begin Velcade chemotherapy today.  He agrees to proceed.  He continues daily Decadron.  The creatinine is better following intravenous hydration. The renal failure is likely related to dehydration in the setting of chronic renal insufficiency, and multiple myeloma.  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   Recommendations:  Continue course of pulse Decadron, currently day 3 Daily CBC and chemistry panel Management of renal failure per nephrology Velcade chemotherapy to begin today Limit sedating medications as possible Out of bed as tolerated Repeat swallow evaluation now that he is more alert      LOS: 3 days   Thornton Papas, MD   12/07/2022, 10:59 AM

## 2022-12-07 NOTE — Consult Note (Signed)
  This 72 years old wheelchair-bound male with PMH significant for blindness, recently diagnosed multiple myeloma in May 2024 but has not started therapy yet, hypertension, diabetes presented s/p fall and found to be confused, tachycardic and constipated with labs significant for AKI with hyperkalemia and severe metabolic acidosis. He was also found to have hemoglobin 3.5, lactic acid 6.0. Patient was initially admitted in the ICU and started on broad-spectrum antibiotics and fluid resuscitation. Patient has received 3 units of packed red blood cells hemoglobin 7.5. TRH pickup 12/06/2022.   Psychiatry consult placed for agitation, aggressive behavior.  After speaking with primary team specific needs are geared towards medication management for suspected agitated delirium.  Patient does present with confusion and agitation intermittently, likely suspect agitated delirium versus acute metabolic encephalopathy secondary to multiple myeloma, uremia, ICU hospitalization, pain. After chart review to include labs and EKG, this provider ordered Haldol 5 mg V 3 times daily for agitation and aggression.  May switch to oral once patient is able to swallow and no longer poses an aspiration risk.  On second evaluation patient is observed to be resting soundly, elderly lady at bedside also resting.  Update obtained from nursing and reports patient has been more calm, cooperative, and less agitated since administering Haldol.  She reports patient appears to have a therapeutic response to this medication and dosing.   Psychiatric consult service will continue to follow for medication management.  Will attempt to assess and complete for psychiatric evaluation once patient is more cooperative and able to participate.  -Continue delirium precautions. -Continue Haldol 5 mg IV 3 times daily, we will likely switch to Haldol 5 mg IV twice daily with a goal to transition to oral once patient is no longer an aspiration risk.  -Labs  reviewed show patient remains critically ill, and will refrain from nephrotoxic medications at this time.

## 2022-12-08 ENCOUNTER — Encounter: Payer: Self-pay | Admitting: Hematology

## 2022-12-08 DIAGNOSIS — N170 Acute kidney failure with tubular necrosis: Secondary | ICD-10-CM | POA: Diagnosis not present

## 2022-12-08 DIAGNOSIS — Z515 Encounter for palliative care: Secondary | ICD-10-CM | POA: Diagnosis not present

## 2022-12-08 DIAGNOSIS — Z7189 Other specified counseling: Secondary | ICD-10-CM | POA: Diagnosis not present

## 2022-12-08 DIAGNOSIS — N179 Acute kidney failure, unspecified: Secondary | ICD-10-CM | POA: Diagnosis not present

## 2022-12-08 DIAGNOSIS — R410 Disorientation, unspecified: Secondary | ICD-10-CM | POA: Diagnosis not present

## 2022-12-08 DIAGNOSIS — C9000 Multiple myeloma not having achieved remission: Secondary | ICD-10-CM | POA: Diagnosis not present

## 2022-12-08 DIAGNOSIS — N184 Chronic kidney disease, stage 4 (severe): Secondary | ICD-10-CM | POA: Diagnosis not present

## 2022-12-08 LAB — GLUCOSE, CAPILLARY
Glucose-Capillary: 158 mg/dL — ABNORMAL HIGH (ref 70–99)
Glucose-Capillary: 161 mg/dL — ABNORMAL HIGH (ref 70–99)
Glucose-Capillary: 167 mg/dL — ABNORMAL HIGH (ref 70–99)
Glucose-Capillary: 180 mg/dL — ABNORMAL HIGH (ref 70–99)
Glucose-Capillary: 187 mg/dL — ABNORMAL HIGH (ref 70–99)
Glucose-Capillary: 229 mg/dL — ABNORMAL HIGH (ref 70–99)

## 2022-12-08 MED ORDER — PANTOPRAZOLE SODIUM 40 MG PO TBEC
40.0000 mg | DELAYED_RELEASE_TABLET | Freq: Two times a day (BID) | ORAL | Status: DC
  Administered 2022-12-08 – 2022-12-29 (×39): 40 mg via ORAL
  Filled 2022-12-08 (×42): qty 1

## 2022-12-08 MED ORDER — HALOPERIDOL LACTATE 5 MG/ML IJ SOLN
5.0000 mg | Freq: Three times a day (TID) | INTRAMUSCULAR | Status: DC | PRN
  Administered 2022-12-09 – 2022-12-28 (×7): 5 mg via INTRAVENOUS
  Filled 2022-12-08 (×8): qty 1

## 2022-12-08 MED ORDER — STERILE WATER FOR INJECTION IJ SOLN
INTRAMUSCULAR | Status: AC
  Administered 2022-12-08: 10 mL
  Filled 2022-12-08: qty 10

## 2022-12-08 NOTE — Plan of Care (Signed)
  Problem: Education: Goal: Knowledge of General Education information will improve Description: Including pain rating scale, medication(s)/side effects and non-pharmacologic comfort measures Outcome: Not Progressing   Problem: Health Behavior/Discharge Planning: Goal: Ability to manage health-related needs will improve Outcome: Not Progressing   Problem: Clinical Measurements: Goal: Ability to maintain clinical measurements within normal limits will improve Outcome: Not Progressing Goal: Will remain free from infection Outcome: Not Progressing Goal: Diagnostic test results will improve Outcome: Progressing Goal: Respiratory complications will improve Outcome: Progressing Goal: Cardiovascular complication will be avoided Outcome: Not Progressing   Problem: Activity: Goal: Risk for activity intolerance will decrease Outcome: Progressing   Problem: Nutrition: Goal: Adequate nutrition will be maintained Outcome: Progressing   Problem: Coping: Goal: Level of anxiety will decrease Outcome: Adequate for Discharge   Problem: Elimination: Goal: Will not experience complications related to bowel motility Outcome: Not Progressing Goal: Will not experience complications related to urinary retention Outcome: Not Progressing   Problem: Pain Managment: Goal: General experience of comfort will improve Outcome: Progressing   Problem: Safety: Goal: Ability to remain free from injury will improve Outcome: Not Progressing   Problem: Skin Integrity: Goal: Risk for impaired skin integrity will decrease Outcome: Progressing   Problem: Education: Goal: Ability to describe self-care measures that may prevent or decrease complications (Diabetes Survival Skills Education) will improve Outcome: Not Progressing Goal: Individualized Educational Video(s) Outcome: Not Progressing   Problem: Coping: Goal: Ability to adjust to condition or change in health will improve Outcome: Not  Progressing   Problem: Fluid Volume: Goal: Ability to maintain a balanced intake and output will improve Outcome: Progressing   Problem: Health Behavior/Discharge Planning: Goal: Ability to identify and utilize available resources and services will improve Outcome: Not Progressing Goal: Ability to manage health-related needs will improve Outcome: Progressing   Problem: Metabolic: Goal: Ability to maintain appropriate glucose levels will improve Outcome: Progressing   Problem: Nutritional: Goal: Maintenance of adequate nutrition will improve Outcome: Progressing Goal: Progress toward achieving an optimal weight will improve Outcome: Progressing   Problem: Skin Integrity: Goal: Risk for impaired skin integrity will decrease Outcome: Progressing   Problem: Tissue Perfusion: Goal: Adequacy of tissue perfusion will improve Outcome: Progressing

## 2022-12-08 NOTE — Progress Notes (Signed)
IP PROGRESS NOTE  Subjective:   Scott Bass denies pain.  He reports tolerating the Velcade well.  No nausea or rash.  He is asking for water.  No pain.  Objective: Vital signs in last 24 hours: Blood pressure (!) 162/82, pulse (!) 106, temperature 97.6 F (36.4 C), temperature source Axillary, resp. rate 18, height 5\' 6"  (1.676 m), weight 156 lb 15.5 oz (71.2 kg), SpO2 99%.  Intake/Output from previous day: 08/01 0701 - 08/02 0700 In: 2855.4 [P.O.:200; I.V.:2555.6; IV Piggyback:99.8] Out: 1290 [Urine:1290]  Physical Exam: HEENT: No thrush, dried secretions over the palate and tongue  Abdomen: Soft, no hepatosplenomegaly, nontender right lower abdomen injection site without evidence of infection or bleeding Vascular: No leg edema Neurologic: Alert, moves all extremities to command Extremities: No leg edema   Lab Results: Recent Labs    12/06/22 0932 12/07/22 0327 12/08/22 0321  WBC 4.9 5.1  --   HGB 9.2* 8.2* 7.8*  HCT 29.3* 26.4* 24.8*  PLT 106* 116*  --     BMET Recent Labs    12/07/22 0501 12/08/22 0321  NA 140 142  K 4.1 4.0  CL 101 108  CO2 26 24  GLUCOSE 146* 185*  BUN 111* 106*  CREATININE 6.78* 5.13*  CALCIUM 7.5* 7.1*     Medications: I have reviewed the patient's current medications.  Assessment/Plan: Multiple myeloma, IgG kappa, diagnosed May 2024-untreated CTs 12/04/2022-innumerable lytic lesions, large expansile lesion in the anterior right second rib, multiple pathologic rib fractures, expansile T4 (T3 on CT cervical spine) mass with soft tissue component extending into the central canal and left neural foramen, C7 transverse process fracture Decadron daily x 4 starting 12/05/2022 Cycle 1 day 1 Velcade 12/07/2022 2.  Acute/chronic renal failure 3.  Severe anemia 4.  Mild thrombocytopenia 5.  Diabetes 6.  Hypertension 7.  Glaucoma 8.  OSA  Scott Bass appears stable.  He has tolerated the Decadron and Velcade well to date.  The creatinine  continues to improve.  We will plan for another course of pulsed Decadron beginning 10/26/2022.  He will continue weekly Velcade.  Daratumumab will be added as an outpatient.   Recommendations:  Continue course of pulse Decadron, currently day 4 Management of renal failure per nephrology Second course of pulsed Decadron to begin 12/12/2022, Velcade 12/14/2022 Limit sedating medications as possible Out of bed as tolerated Diet per speech therapy Please call oncology as needed, I will check on him over the weekend      LOS: 4 days   Thornton Papas, MD   12/08/2022, 6:42 AM

## 2022-12-08 NOTE — Progress Notes (Signed)
[facility] Progress Note   Subjective:   Cr cont to improve. I/Os yest 2.9 / 2.7.  Wife bedside today. Velcade given yesterday.  His mental status is improving  - was up in chair all AM and fed himself 25% of breakfast.   Objective Vitals:   12/08/22 0800 12/08/22 0900 12/08/22 1000 12/08/22 1151  BP: (!) 152/73 (!) 152/78 (!) 153/72   Pulse: 95 100 87   Resp: 18 15 (!) 9   Temp:    98 F (36.7 C)  TempSrc:    Oral  SpO2: 96% 96% 96%   Weight:      Height:       Physical Exam Gen:  awake and calm in bed ENT: MMM CV:  RRR without rub Abd:  soft, nontender Lungs: clear on RA GU: foley draining clear yellow urine Extr:  no edema Neuro: awake and calm, no deficits noted; blind Skin: warm and dry  Additional Objective Labs: Basic Metabolic Panel: Recent Labs  Lab 12/06/22 0615 12/07/22 0501 12/08/22 0321  NA 135 140 142  K 5.0 4.1 4.0  CL 95* 101 108  CO2 28 26 24   GLUCOSE 202* 146* 185*  BUN 98* 111* 106*  CREATININE 7.50* 6.78* 5.13*  CALCIUM 7.6* 7.5* 7.1*  PHOS  --  5.8*  --    Liver Function Tests: Recent Labs  Lab 12/04/22 0515 12/06/22 0615  AST 35 31  ALT 28 19  ALKPHOS 62 58  BILITOT 1.0 0.9  PROT >12.0* 9.4*  ALBUMIN 2.6* 1.9*   No results for input(s): "LIPASE", "AMYLASE" in the last 168 hours. CBC: Recent Labs  Lab 12/04/22 0827 12/04/22 0918 12/04/22 1635 12/05/22 0100 12/06/22 0932 12/07/22 0327 12/08/22 0321  WBC 4.4 5.7  --  4.1 4.9 5.1  --   NEUTROABS 2.9 3.1  --   --  3.9 4.0  --   HGB 3.5* 3.5*   < > 7.4* 9.2* 8.2* 7.8*  HCT 11.8* 11.7*   < > 23.0* 29.3* 26.4* 24.8*  MCV 99.2 96.7  --  90.6 91.8 92.3  --   PLT 131* 137*  --  115* 106* 116*  --    < > = values in this interval not displayed.   Blood Culture    Component Value Date/Time   SDES  12/06/2022 0630    BLOOD BLOOD LEFT FOREARM Performed at [facility], [address] [place], [place] [address]    SPECREQUEST  12/06/2022  0630    BOTTLES DRAWN AEROBIC ONLY Blood Culture adequate volume Performed at [facility], [address] [place], [place] [address]    CULT  12/06/2022 0630    NO GROWTH 2 DAYS Performed at [facility], [address], [place], [place] [address]    REPTSTATUS PENDING 12/06/2022 0630    Cardiac Enzymes: Recent Labs  Lab 12/04/22 0515  CKTOTAL 66   CBG: Recent Labs  Lab 12/07/22 1612 12/07/22 1956 12/07/22 2346 12/08/22 0321 12/08/22 1124  GLUCAP 168* 155* 150* 167* 229*   Iron Studies: No results for input(s): "IRON", "TIBC", "TRANSFERRIN", "FERRITIN" in the last 72 hours. @lablastinr3 @ Studies/Results: No results found. Medications:  lactated ringers 125 mL/hr at 12/08/22 1100    amLODipine  5 mg Oral Daily   brimonidine  1 drop Both Eyes TID   Chlorhexidine Gluconate Cloth  6 each Topical Q2200   dexamethasone  20 mg Oral Q breakfast   ferrous sulfate  325 mg Oral QODAY   insulin aspart  0-15 Units Subcutaneous Q4H   latanoprost  1 drop Both Eyes QHS   levothyroxine  150 mcg Oral Q0600   pantoprazole  40 mg Oral BID   polyethylene glycol  17 g Oral Daily   sennosides  15 mL Oral BID   tamsulosin  0.4 mg Oral Daily    Assessment/Plan **AKI on CKD: recent baseline Cr ~1.5-1.7mg /dL > 1/61 3.4 > presented with Cr 11.7.   Suspect that the untreated myeloma is a major driving factor here for his AKI but certainly initially very dry.  Imaging shows no obstruction.  We'll pursue maximum medical therapy at this point but would  not consider him a candidate for dialysis in light of his advanced malignancy, comorbids, functional status (wheel chair bound and nearly bed bound recently) and would recommend against it.  He currently has no indications for dialysis at this time anyway.  Have discussed with wife no dialysis.  Having good UOP and some improvement in Cr --> cont max medical care for now.  BUN likely up a bit related to steroids.  Avoid  nephrotoxins, dose meds for crcl < 10, maintain euvolemia.  PO intake is not robust but improved - IVF continue for now but decrease rate today.   **Anemia:  Hb 3.5 initially, rec'd 2u pRBC and is stable in the 8s.   **Myeloma:  dx 09/2022 --> saw Dr. Candise Che and PET, chemo ordered; pt frustrated by IV issue at PET in early 10/2022 and left scan (he reports), hasn't started chemo --> onc has been consulted inpatient as has palliative.  Onc following and started decadron, velcade   **Possible sepsis:  improved; s/p  CTX for UTI. 1 of 2 blood cultures with staph thought to be contaminant and repeat cultures sent 7/31 NGTD   **HTN: BP remains elevated, started amlodipine 5. Vol looks ok.    Will follow, reach out if I can further assist.    Estill Bakes MD 12/08/2022, 12:14 PM  [facility] Pager: [phone]

## 2022-12-08 NOTE — Progress Notes (Signed)
PROGRESS NOTE    Scott Bass  ONG:[id number] DOB: 08/03/1950 DOA: 12/04/2022 PCP: Tracey Harries, MD   Brief Narrative:  This 72 years old wheelchair-bound male with PMH significant for blindness, recently diagnosed multiple myeloma in May 2024 but has not started therapy yet, hypertension, diabetes presented s/p fall and found to be confused, tachycardic and constipated with labs significant for AKI with hyperkalemia and severe metabolic acidosis.  He was also found to have hemoglobin 3.5, lactic acid 6.0.  Patient was initially admitted in the ICU and started on broad-spectrum antibiotics and fluid resuscitation.  Patient has received 3 units of packed red blood cells hemoglobin 7.5.  TRH pickup 12/06/2022.  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  Not a candidate for hemodialysis given advanced malignancy, comorbidity and functional status. Serum creatinine improving 11.76 > 11.06 >10.40 > 9.52> 9.10 >8.48 > 7.50 >6.78 > 5.13 Continue IV fluid resuscitation.  Hyperkalemia: Resolved with calcium gluconate, Lasix, insulin and D50.  Hyponatremia: resolved with IV hydration.  Metabolic acidosis: Resolved with sodium bicarbonate infusion.  Lactic acidosis > Improving. Continue IV fluid resuscitation.   Lactic acid improving 6.0> 2.7  Acute blood loss anemia likely secondary to multiple myeloma: S/p 2 unit PRBC.  Hemoglobin 7.4 >8.2 Keep hemoglobin above 7.  Tachycardia: Likely multifactorial due to anemia,  dehydration, suspected sepsis. One of the 4 blood culture showed Staphylococcus Capitis.  Likely contaminant  Multiple myeloma with diffuse pathological fractures Mild thrombocytopenia: Oncology is consulted recommended Decadron and Velcade. Palliative consulted to discuss goals of care.  Large mass involving T4 vertebral body with soft tissue component: MRI with and without C and T-spine when renal functions improves.  Acute metabolic encephalopathy Likely secondary to multiple myeloma, uremia and others. Minimize sedation,  reorient as above. Continue Haldol as needed for agitation and restlessness. Psychiatry consulted for agitation, restlessness.   Constipation: Aggressive bowel regimen.  Hypothyroidism: Continue Synthroid.  DVT prophylaxis: SCDs Code Status: Full code Family Communication: No family at bed side. Disposition Plan:    Status is: Inpatient Remains inpatient appropriate because: Admitted for AKI on CKD stage IV in the setting of multiple myeloma,  not been treated. AKI is improving,  not a candidate for hemodialysis.   Consultants:  PCCM Nephrology  Procedures:None  Antimicrobials:  Anti-infectives (From admission, onward)    Start     Dose/Rate Route Frequency Ordered Stop   12/05/22 1000  cefTRIAXone (ROCEPHIN) 2 g in sodium chloride 0.9 % 100 mL IVPB        2 g 200 mL/hr over 30 Minutes Intravenous Every 24 hours 12/04/22 1340 12/08/22 1008   12/04/22 0600  ceFEPIme (MAXIPIME) 2 g in sodium chloride 0.9 % 100 mL IVPB        2 g 200 mL/hr over 30 Minutes Intravenous  Once 12/04/22 0557 12/04/22 0726   12/04/22 0600  metroNIDAZOLE (FLAGYL) IVPB 500 mg        500 mg 100 mL/hr over 60 Minutes Intravenous  Once 12/04/22 0557 12/04/22 0829  12/04/22 0600  vancomycin (VANCOCIN) IVPB 1000 mg/200 mL premix        1,000 mg 200 mL/hr over 60 Minutes Intravenous  Once 12/04/22 0557 12/04/22 8469      Subjective: Patient was seen and examined at  bedside. Overnight events noted.  Patient seems much improved. He was sitting comfortably on the chair with his wife having conversation.  Objective: Vitals:   12/08/22 1100 12/08/22 1151 12/08/22 1200 12/08/22 1300  BP: (!) 145/70  (!) 153/74 (!) 154/71  Pulse: 90  96 78  Resp: 11  14 10   Temp:  98 F (36.7 C)    TempSrc:  Oral    SpO2: 95%  99% 100%  Weight:      Height:        Intake/Output Summary (Last 24 hours) at 12/08/2022 1340 Last data filed at 12/08/2022 1320 Gross per 24 hour  Intake 3182.73 ml  Output 2740 ml  Net 442.73 ml   Filed Weights   12/06/22 0452 12/07/22 0500 12/08/22 0328  Weight: 69.3 kg 68 kg 71.2 kg    Examination:  General exam: Appears comfortable, deconditioned, not in any acute distress. Respiratory system: CTA bilaterally. Respiratory effort normal. RR 14 Cardiovascular system: S1 & S2 heard, RRR. No JVD, murmurs, rubs, gallops or clicks. Gastrointestinal system: Abdomen is soft, non tender, non distended, bowel sounds present Central nervous system: Alert and oriented x 2. No focal neurological deficits. Extremities: No edema, no cyanosis, no clubbing  Skin: No rashes, lesions or ulcers Psychiatry:  Mood & affect appropriate.     Data Reviewed: I have personally reviewed following labs and imaging studies  CBC: Recent Labs  Lab 12/04/22 0827 12/04/22 0918 12/04/22 1635 12/05/22 0100 12/06/22 0932 12/07/22 0327 12/08/22 0321  WBC 4.4 5.7  --  4.1 4.9 5.1  --   NEUTROABS 2.9 3.1  --   --  3.9 4.0  --   HGB 3.5* 3.5* 6.2* 7.4* 9.2* 8.2* 7.8*  HCT 11.8* 11.7* 19.4* 23.0* 29.3* 26.4* 24.8*  MCV 99.2 96.7  --  90.6 91.8 92.3  --   PLT 131* 137*  --  115* 106* 116*  --    Basic Metabolic Panel: Recent Labs  Lab 12/05/22 0100 12/05/22 0655 12/05/22 1219 12/05/22 2122 12/06/22 0615 12/07/22 0501 12/08/22 0321  NA 132*   < > 133* 136 135 140 142  K 3.9   < > 4.1 4.7 5.0 4.1 4.0  CL 98   < > 93* 97* 95* 101 108  CO2 22   < >  26 26 28 26 24   GLUCOSE 214*   < > 180* 135* 202* 146* 185*  BUN 113*   < > 105* 103* 98* 111* 106*  CREATININE 9.52*   < > 9.10* 8.48* 7.50* 6.78* 5.13*  CALCIUM 8.2*   < > 7.8* 7.6* 7.6* 7.5* 7.1*  MG 3.6*  --   --   --   --  3.7*  --   PHOS  --   --   --   --   --  5.8*  --    < > = values in this interval not displayed.   GFR: Estimated Creatinine Clearance: 11.9 mL/min (A) (by C-G formula based on SCr of 5.13 mg/dL (H)). Liver Function Tests: Recent Labs  Lab 12/04/22 0515 12/06/22 0615  AST 35 31  ALT 28 19  ALKPHOS 62 58  BILITOT 1.0 0.9  PROT >12.0* 9.4*  ALBUMIN 2.6* 1.9*  No results for input(s): "LIPASE", "AMYLASE" in the last 168 hours. No results for input(s): "AMMONIA" in the last 168 hours. Coagulation Profile: Recent Labs  Lab 12/04/22 0515  INR 1.5*   Cardiac Enzymes: Recent Labs  Lab 12/04/22 0515  CKTOTAL 66   BNP (last 3 results) No results for input(s): "PROBNP" in the last 8760 hours. HbA1C: No results for input(s): "HGBA1C" in the last 72 hours. CBG: Recent Labs  Lab 12/07/22 1612 12/07/22 1956 12/07/22 2346 12/08/22 0321 12/08/22 1124  GLUCAP 168* 155* 150* 167* 229*   Lipid Profile: No results for input(s): "CHOL", "HDL", "LDLCALC", "TRIG", "CHOLHDL", "LDLDIRECT" in the last 72 hours. Thyroid Function Tests: No results for input(s): "TSH", "T4TOTAL", "FREET4", "T3FREE", "THYROIDAB" in the last 72 hours. Anemia Panel: No results for input(s): "VITAMINB12", "FOLATE", "FERRITIN", "TIBC", "IRON", "RETICCTPCT" in the last 72 hours. Sepsis Labs: Recent Labs  Lab 12/04/22 0529 12/04/22 0819 12/04/22 0918 12/05/22 1219 12/06/22 0615 12/07/22 0501 12/08/22 0321  PROCALCITON  --   --    < > 5.01 3.72 2.42 1.23  LATICACIDVEN 6.0* 2.7*  --   --   --   --   --    < > = values in this interval not displayed.    Recent Results (from the past 240 hour(s))  Blood Culture (routine x 2)     Status: Abnormal   Collection Time: 12/04/22   6:16 AM   Specimen: BLOOD  Result Value Ref Range Status   Specimen Description   Final    BLOOD RIGHT ANTECUBITAL Performed at [facility], [address] [place], [place] [address]    Special Requests   Final    BOTTLES DRAWN AEROBIC AND ANAEROBIC Blood Culture adequate volume Performed at [facility], [address], [place], [place] [address]    Culture  Setup Time   Final    GRAM POSITIVE COCCI IN CLUSTERS AEROBIC BOTTLE ONLY CRITICAL RESULT CALLED TO, READ BACK BY AND VERIFIED WITH: PHARMD M. Doran Durand 12/05/22 @ 0604 BY AB    Culture (A)  Final    STAPHYLOCOCCUS CAPITIS THE SIGNIFICANCE OF ISOLATING THIS ORGANISM FROM A SINGLE SET OF BLOOD CULTURES WHEN MULTIPLE SETS ARE DRAWN IS UNCERTAIN. PLEASE NOTIFY THE MICROBIOLOGY DEPARTMENT WITHIN ONE WEEK IF SPECIATION AND SENSITIVITIES ARE REQUIRED. Performed at [facility], [address], [place], [place] [address]    Report Status 12/06/2022 FINAL  Final  Blood Culture ID Panel (Reflexed)     Status: Abnormal   Collection Time: 12/04/22  6:16 AM  Result Value Ref Range Status   Enterococcus faecalis NOT DETECTED NOT DETECTED Final   Enterococcus Faecium NOT DETECTED NOT DETECTED Final   Listeria monocytogenes NOT DETECTED NOT DETECTED Final   Staphylococcus species DETECTED (A) NOT DETECTED Final    Comment: CRITICAL RESULT CALLED TO, READ BACK BY AND VERIFIED WITH: PHARMD M. LILLISTON 12/05/22 @ 0604 BY AB    Staphylococcus aureus (BCID) NOT DETECTED NOT DETECTED Final   Staphylococcus epidermidis NOT DETECTED NOT DETECTED Final   Staphylococcus lugdunensis NOT DETECTED NOT DETECTED Final   Streptococcus species NOT DETECTED NOT DETECTED Final   Streptococcus agalactiae NOT DETECTED NOT DETECTED Final   Streptococcus pneumoniae NOT DETECTED NOT DETECTED Final   Streptococcus pyogenes NOT DETECTED NOT DETECTED Final   A.calcoaceticus-baumannii NOT DETECTED NOT DETECTED Final    Bacteroides fragilis NOT DETECTED NOT DETECTED Final   Enterobacterales NOT DETECTED NOT DETECTED Final   Enterobacter cloacae complex  NOT DETECTED NOT DETECTED Final   Escherichia coli NOT DETECTED NOT DETECTED Final   Klebsiella aerogenes NOT DETECTED NOT DETECTED Final   Klebsiella oxytoca NOT DETECTED NOT DETECTED Final   Klebsiella pneumoniae NOT DETECTED NOT DETECTED Final   Proteus species NOT DETECTED NOT DETECTED Final   Salmonella species NOT DETECTED NOT DETECTED Final   Serratia marcescens NOT DETECTED NOT DETECTED Final   Haemophilus influenzae NOT DETECTED NOT DETECTED Final   Neisseria meningitidis NOT DETECTED NOT DETECTED Final   Pseudomonas aeruginosa NOT DETECTED NOT DETECTED Final   Stenotrophomonas maltophilia NOT DETECTED NOT DETECTED Final   Candida albicans NOT DETECTED NOT DETECTED Final   Candida auris NOT DETECTED NOT DETECTED Final   Candida glabrata NOT DETECTED NOT DETECTED Final   Candida krusei NOT DETECTED NOT DETECTED Final   Candida parapsilosis NOT DETECTED NOT DETECTED Final   Candida tropicalis NOT DETECTED NOT DETECTED Final   Cryptococcus neoformans/gattii NOT DETECTED NOT DETECTED Final    Comment: Performed at [facility], [address] [place], [place] [address]  Blood Culture (routine x 2)     Status: None (Preliminary result)   Collection Time: 12/04/22  6:30 AM   Specimen: BLOOD  Result Value Ref Range Status   Specimen Description   Final    BLOOD LEFT ANTECUBITAL Performed at [facility], [address] [place], [place] [address]    Special Requests   Final    BOTTLES DRAWN AEROBIC AND ANAEROBIC Blood Culture adequate volume Performed at [facility], [address], [place], [place] [address]    Culture   Final    NO GROWTH 4 DAYS Performed at [facility], [address] [place], [place] [address]    Report Status PENDING  Incomplete  Resp panel by RT-PCR (RSV, Flu A&B,  Covid) Anterior Nasal Swab     Status: None   Collection Time: 12/04/22  8:15 AM   Specimen: Anterior Nasal Swab  Result Value Ref Range Status   SARS Coronavirus 2 by RT PCR NEGATIVE NEGATIVE Final    Comment: (NOTE) SARS-CoV-2 target nucleic acids are NOT DETECTED.  The SARS-CoV-2 RNA is generally detectable in upper respiratory specimens during the acute phase of infection. The lowest concentration of SARS-CoV-2 viral copies this assay can detect is 138 copies/mL. A negative result does not preclude SARS-Cov-2 infection and should not be used as the sole basis for treatment or other patient management decisions. A negative result may occur with  improper specimen collection/handling, submission of specimen other than nasopharyngeal swab, presence of viral mutation(s) within the areas targeted by this assay, and inadequate number of viral copies(<138 copies/mL). A negative result must be combined with clinical observations, patient history, and epidemiological information. The expected result is Negative.  Fact Sheet for Patients:  BloggerCourse.com  Fact Sheet for Healthcare Providers:  SeriousBroker.it  This test is no t yet approved or cleared by the [place] FDA and  has been authorized for detection and/or diagnosis of SARS-CoV-2 by FDA under an Emergency Use Authorization (EUA). This EUA will remain  in effect (meaning this test can be used) for the duration of the COVID-19 declaration under Section 564(b)(1) of the Act, 21 U.S.C.section 360bbb-3(b)(1), unless the authorization is terminated  or revoked sooner.       Influenza A by PCR NEGATIVE NEGATIVE Final   Influenza B by PCR NEGATIVE NEGATIVE Final    Comment: (NOTE) The Xpert Xpress SARS-CoV-2/FLU/RSV plus  assay is intended as an aid in the diagnosis of influenza from Nasopharyngeal swab specimens and should not be used as a sole basis for treatment. Nasal  washings and aspirates are unacceptable for Xpert Xpress SARS-CoV-2/FLU/RSV testing.  Fact Sheet for Patients: BloggerCourse.com  Fact Sheet for Healthcare Providers: SeriousBroker.it  This test is not yet approved or cleared by the [place] FDA and has been authorized for detection and/or diagnosis of SARS-CoV-2 by FDA under an Emergency Use Authorization (EUA). This EUA will remain in effect (meaning this test can be used) for the duration of the COVID-19 declaration under Section 564(b)(1) of the Act, 21 U.S.C. section 360bbb-3(b)(1), unless the authorization is terminated or revoked.     Resp Syncytial Virus by PCR NEGATIVE NEGATIVE Final    Comment: (NOTE) Fact Sheet for Patients: BloggerCourse.com  Fact Sheet for Healthcare Providers: SeriousBroker.it  This test is not yet approved or cleared by the [place] FDA and has been authorized for detection and/or diagnosis of SARS-CoV-2 by FDA under an Emergency Use Authorization (EUA). This EUA will remain in effect (meaning this test can be used) for the duration of the COVID-19 declaration under Section 564(b)(1) of the Act, 21 U.S.C. section 360bbb-3(b)(1), unless the authorization is terminated or revoked.  Performed at [facility], [address], [place], [place] [address]   MRSA Next Gen by PCR, Nasal     Status: None   Collection Time: 12/04/22 10:57 AM   Specimen: Nasal Mucosa; Nasal Swab  Result Value Ref Range Status   MRSA by PCR Next Gen NOT DETECTED NOT DETECTED Final    Comment: (NOTE) The GeneXpert MRSA Assay (FDA approved for NASAL specimens only), is one component of a comprehensive MRSA colonization surveillance program. It is not intended to diagnose MRSA infection nor to guide or monitor treatment for MRSA infections. Test performance is not FDA approved in patients less  than 24 years old. Performed at [facility], [address], [place], [place] [address]   Culture, blood (Routine X 2) w Reflex to ID Panel     Status: None (Preliminary result)   Collection Time: 12/06/22  6:15 AM   Specimen: BLOOD  Result Value Ref Range Status   Specimen Description   Final    BLOOD BLOOD RIGHT ARM Performed at [facility], [address], [place], [place] [address]    Special Requests   Final    BOTTLES DRAWN AEROBIC ONLY Blood Culture adequate volume Performed at [facility], [address], [place], [place] [address]    Culture   Final    NO GROWTH 2 DAYS Performed at [facility], [address] [place], [place] [address]    Report Status PENDING  Incomplete  Culture, blood (Routine X 2) w Reflex to ID Panel     Status: None (Preliminary result)   Collection Time: 12/06/22  6:30 AM   Specimen: BLOOD  Result Value Ref Range Status   Specimen Description   Final    BLOOD BLOOD LEFT FOREARM Performed at [facility], [address] [place], [place] [address]    Special Requests   Final    BOTTLES DRAWN AEROBIC ONLY Blood Culture adequate volume Performed at [facility], [address], [place], [place] [address]    Culture   Final    NO GROWTH 2 DAYS Performed at [facility], [address] [place], [place] [address]  Report Status PENDING  Incomplete   Radiology Studies: No results found.  Scheduled Meds:  amLODipine  5 mg Oral Daily   brimonidine  1 drop Both Eyes TID   Chlorhexidine Gluconate Cloth  6 each Topical Q2200   dexamethasone  20 mg Oral Q breakfast   ferrous sulfate  325 mg Oral QODAY   insulin aspart  0-15 Units Subcutaneous Q4H   latanoprost  1 drop Both Eyes QHS   levothyroxine  150 mcg Oral Q0600   pantoprazole  40 mg Oral BID   polyethylene glycol  17 g Oral Daily   sennosides  15 mL Oral BID   tamsulosin   0.4 mg Oral Daily   Continuous Infusions:  lactated ringers 75 mL/hr at 12/08/22 1320     LOS: 4 days    Time spent: 35 mins    Willeen Niece, MD [facility]   If 7PM-7AM, please contact night-coverage

## 2022-12-08 NOTE — Progress Notes (Signed)
? ?  Inpatient Rehab Admissions Coordinator : ? ?Per therapy recommendations, patient was screened for CIR candidacy by   RN MSN.  At this time patient appears to be a potential candidate for CIR. I will place a rehab consult per protocol for full assessment. Please call me with any questions. ? ?  RN MSN ?Admissions Coordinator ?[phone] ?  ?

## 2022-12-08 NOTE — Evaluation (Signed)
Occupational Therapy Evaluation Patient Details Name: Scott Bass MRN: [id number] DOB: 11-01-1950 Today's Date: 12/08/2022   History of Present Illness 72 year old male presented for fall and found confused, tachycardic and constipated with labs significant for AKI with hyperkalemia and severe metabolic acidosis and Hg 3.5. LA 6.0;  CTs 12/04/2022-innumerable lytic lesions, large expansile lesion in the anterior right second rib, multiple pathologic rib fractures, expansile T4 (T3 on CT cervical spine) mass with soft tissue component extending into the central canal and left neural foramen, C7 transverse process fracture, displaced fracture of the lower RIGHT scapula, of uncertain age. PHMx: blindness (can see some, but not details), recently diagnosed multiple myeloma in May 2024 but has not started therapy (diffuse lesions) , HTN, DM   Clinical Impression   This 72 yo male admitted with above presents to acute OT with PLOF per his and wife's report of being Mod I with all basic ADLs without an AD and he and wife do IADLs together (wife reports same). They use public or private transportation to get around. Currently he is a setup/S-min A +2 for safety/lines for ADLs and mobility but was limited today by dizziness and need for bowel movement. We will continue to follow with follow up intensive inpatient follow up therapy, >3 hours/day recommended.        Recommendations for follow up therapy are one component of a multi-disciplinary discharge planning process, led by the attending physician.  Recommendations may be updated based on patient status, additional functional criteria and insurance authorization.   Assistance Recommended at Discharge Frequent or constant Supervision/Assistance  Patient can return home with the following A little help with walking and/or transfers;A lot of help with bathing/dressing/bathroom;Assistance with cooking/housework;Assistance with feeding;Assist for  transportation;Help with stairs or ramp for entrance    Functional Status Assessment  Patient has had a recent decline in their functional status and demonstrates the ability to make significant improvements in function in a reasonable and predictable amount of time.  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); wife totally blind Restrictions Weight Bearing Restrictions: No      Mobility Bed Mobility               General bed mobility comments: Pt up in recliner upon arrival    Transfers Overall transfer level: Needs assistance Equipment used: Rolling walker (2 wheels), None Transfers: Sit to/from Stand, Bed to chair/wheelchair/BSC Sit to Stand: Min assist, +2 safety/equipment Stand pivot transfers: Min assist, +2 safety/equipment                Balance Overall balance assessment: Needs assistance Sitting-balance support: No upper extremity supported, Feet supported Sitting balance-Leahy Scale: Good     Standing balance support: Bilateral upper extremity supported, Reliant on assistive device for balance Standing balance-Leahy Scale: Poor                             ADL either performed or assessed with clinical judgement   ADL Overall ADL's : Needs assistance/impaired Eating/Feeding: Set up;Sitting Eating/Feeding Details (indicate cue type and reason): in recliner Grooming: Supervision/safety;Set up;Sitting Grooming Details (indicate cue type and reason): in recliner Upper Body Bathing: Set up;Supervision/ safety;Sitting Upper Body Bathing Details (indicate cue type and reason): in recliner Lower Body Bathing: Minimal assistance;Sit to/from stand   Upper Body Dressing : Set up;Supervision/safety Upper Body Dressing  Details (indicate cue type and reason): in recliner Lower Body Dressing: Minimal assistance;Sit to/from stand   Toilet  Transfer: Minimal assistance;+2 for safety/equipment;Stand-pivot;BSC/3in1   Toileting- Clothing Manipulation and Hygiene: Total assistance Toileting - Clothing Manipulation Details (indicate cue type and reason): min A sit<>stand and stand pivot (+2 safety)             Vision Baseline Vision/History: 2 Legally blind Ability to See in Adequate Light: 3 Highly impaired              Pertinent Vitals/Pain Pain Assessment Pain Assessment: No/denies pain     Hand Dominance Right   Extremity/Trunk Assessment Upper Extremity Assessment Upper Extremity Assessment: Overall WFL for tasks assessed           Communication Communication Communication: No difficulties   Cognition Arousal/Alertness: Awake/alert Behavior During Therapy: WFL for tasks assessed/performed Overall Cognitive Status:  (followed all commands asked of him)                                                  Home Living Family/patient expects to be discharged to:: Private residence Living Arrangements: Spouse/significant other Available Help at Discharge: Family;Available 24 hours/day Type of Home: House Home Access: Level entry     Home Layout: One level     Bathroom Shower/Tub: Chief Strategy Officer: Handicapped height     Home Equipment: Agricultural consultant (2 wheels);Cane - quad          Prior Functioning/Environment Prior Level of Function : Independent/Modified Independent             Mobility Comments: indep, uses cane occasionally, reports difficulty getting down and up off commode due to low surface height ADLs Comments: pt reports independence, doesn't drive anymore and doesn't help as much with the cooking1, sponge bath        OT Problem List: Decreased activity tolerance;Impaired balance (sitting and/or standing)      OT Treatment/Interventions: Self-care/ADL training;DME and/or AE instruction;Balance training;Patient/family education    OT  Goals(Current goals can be found in the care plan section) Acute Rehab OT Goals Patient Stated Goal: not really wanting to go to rehab before home  OT Frequency: Min 1X/week    Co-evaluation PT/OT/SLP Co-Evaluation/Treatment: Yes Reason for Co-Treatment: For patient/therapist safety;To address functional/ADL transfers PT goals addressed during session: Mobility/safety with mobility;Balance;Strengthening/ROM;Proper use of DME OT goals addressed during session: Strengthening/ROM;ADL's and self-care      AM-PAC OT "6 Clicks" Daily Activity     Outcome Measure Help from another person eating meals?: A Little Help from another person taking care of personal grooming?: A Little Help from another person toileting, which includes using toliet, bedpan, or urinal?: A Lot Help from another person bathing (including washing, rinsing, drying)?: A Little Help from another person to put on and taking off regular upper body clothing?: A Little Help from another person to put on and taking off regular lower body clothing?: A Little 6 Click Score: 17   End of Session Equipment Utilized During Treatment: Gait belt;Rolling walker (2 wheels) Nurse Communication:  (Pt's IV disconnected and running on floor; pt needs to have a bowel movement and it is where you can see it, but he cannot get it to come out.)  Activity Tolerance:  (limited by dizziness that did not dissipate per pt when standing or sitting  down on 3n1; also pt need to have a bowel movement but reports he could not while seated on 3n1) Patient left: in chair;with call bell/phone within reach;with chair alarm set;with nursing/sitter in room;with family/visitor present  OT Visit Diagnosis: Unsteadiness on feet (R26.81);Other abnormalities of gait and mobility (R26.89);Muscle weakness (generalized) (M62.81);Other symptoms and signs involving cognitive function;Dizziness and giddiness (R42)                Time: 1610-9604 OT Time Calculation (min):  27 min Charges:  OT General Charges $OT Visit: 1 Visit OT Evaluation $OT Eval Moderate Complexity: 1 Mod  Cathy L. OT Acute Rehabilitation Services Office [phone]    Evette Georges 12/08/2022, 12:19 PM

## 2022-12-08 NOTE — Progress Notes (Signed)
Initial Nutrition Assessment  DOCUMENTATION CODES:   Non-severe (moderate) malnutrition in context of chronic illness  INTERVENTION:  - DYS 2 diet with nectar thickened liquids per SLP recs.  - Magic cup BID with meals, each supplement provides 290 kcal and 9 grams of protein - Mighty Shake BID with meals, each supplement provides 330 kcals and 9 grams of protein - Encourage intake at all meals and of supplements as tolerated.  - Monitor weight trends.   NUTRITION DIAGNOSIS:   Moderate Malnutrition related to chronic illness as evidenced by mild fat depletion, mild muscle depletion, percent weight loss (28% in 3 months).  GOAL:   Patient will meet greater than or equal to 90% of their needs  MONITOR:   PO intake, Supplement acceptance, Diet advancement, Weight trends  REASON FOR ASSESSMENT:   Malnutrition Screening Tool    ASSESSMENT:   72 y.o. wheelchair-bound male with PMH significant for blindness, recently diagnosed multiple myeloma in May 2024 but has not started therapy yet, HTN, diabetes who presented s/p fall and found to be confused, tachycardic and constipated with labs significant for AKI with hyperkalemia and severe metabolic acidosis.  Admitted for acute on chronic renal failure.  Patient sitting in bedside chair at time of visit, wife at bedside.   He endorses a UBW of 178# and denies any recent changes in weight. However, weight history over the past year per chart review as below: 5/3: 211# 5/23: 198# 6/4: 183#  7/29: 152#  This could represent a 59# or 28% weight loss in 3 months if accurate, which would be severe. This is a very drastic weight loss in a short time frame however it is noted patient was diagnosed with multiple myeloma in May so suspect if may be accurate. Will need to monitor weights closely.   Patient endorses eating well at home. Usually has eggs, sausage, and toast for breakfast, a bologna sandwich with chips for lunch, and a meat with  bread and a vegetable for dinner. Appetite has been "fair" recently. He also reports drinking Ensure at home but admits he doesn't really like it.  Endorses no issues with chewing but that he has had swallowing issues for awhile. Does not feel they have gotten worse recently. Patient being followed by SLP and recommended a DYS 2 diet with nectar thickened liquids (thin liquids between meals). He is documented to have had only 0-1% of meals so far this admission. Patient agreeable to try Magic Cup and Mighty Shake to support intake, likes vanilla.    Medications reviewed and include: 325mg  ferrous sulfate, Miralax, Senokot  Labs reviewed:  Creatinine 5.13 HA1C 7.3 Blood Glucose 144-168 x24 hours   NUTRITION - FOCUSED PHYSICAL EXAM:  Flowsheet Row Most Recent Value  Orbital Region Mild depletion  Upper Arm Region Moderate depletion  Thoracic and Lumbar Region No depletion  Buccal Region Mild depletion  Temple Region No depletion  Clavicle Bone Region Mild depletion  Clavicle and Acromion Bone Region Mild depletion  Scapular Bone Region Unable to assess  Dorsal Hand Mild depletion  Patellar Region Moderate depletion [patient wheelchair bound]  Anterior Thigh Region Moderate depletion [patient wheelchair bound]  Posterior Calf Region Moderate depletion [patient wheelchair bound]  Edema (RD Assessment) None  Hair Reviewed  Eyes Reviewed  Mouth Reviewed  Skin Reviewed  Nails Reviewed       Diet Order:   Diet Order             DIET - DYS 1 Room service  appropriate? Yes; Fluid consistency: Nectar Thick  Diet effective now                   EDUCATION NEEDS:  Education needs have been addressed  Skin:  Skin Assessment: Reviewed RN Assessment  Last BM:  8/2  Height:  Ht Readings from Last 1 Encounters:  12/04/22 5\' 6"  (1.676 m)   Weight:  Wt Readings from Last 1 Encounters:  12/08/22 71.2 kg    BMI:  Body mass index is 25.34 kg/m.  Estimated Nutritional  Needs:  Kcal:  1950-2100 kcals Protein:  70-85 grams Fluid:  >/= 1.9L    Shelle Iron RD, LDN For contact information, refer to [facility].

## 2022-12-08 NOTE — Progress Notes (Signed)
Speech Language Pathology Treatment: Dysphagia  Patient Details Name: Scott Bass MRN: [id number] DOB: 08/05/1950 Today's Date: 12/08/2022 Time: 9562-1308 SLP Time Calculation (min) (ACUTE ONLY): 12 min  Assessment / Plan / Recommendation Clinical Impression  SLP follow up for determining appropriateness for use of Provale cup with thin water as pt requesting water t/o session.  SLP presented thin water in10 cc Provale cup.  Pt observed consuming thin water from cup self feeding - cough noted immediately post-swallow x3 of 6 boluses.    Reached out to MD who approved consumption of thin water between meals  *Dr Idelle Leech*.  Put name sticker on cup for pt at approx 1220- wife was on the phone ordering food therefore unable to demonstrates its use to her but pt reports satisfaction with its use. Posted signs with cup information.     Will continue to see pt for dysphagia management.        HPI HPI: Briefly, 72 year old wheel chair bound male with blindness, recently diagnosed multiple myeloma in May 2024 but not has started therapy, HTN, DM who presented for fall and found confused, tachycardic and constipated with labs significant for AKI with hyperkalemia and severe metabolic acidosis and Hg 3.5. LA 6.0. PCCM consulted for admission. In the ED started on broad spectrum abx and fluid resuscitation; CT chest indicated No acute findings within the chest, abdomen or pelvis. No  evidence of solid organ injury. No hemorrhage or edema within the  mediastinum. No pleural effusion or pneumothorax ; ST consulted for swallow evaluation d/t dysphagia noted with liquids per nursing report.      SLP Plan  Continue with current plan of care      Recommendations for follow up therapy are one component of a multi-disciplinary discharge planning process, led by the attending physician.  Recommendations may be updated based on patient status, additional functional criteria and insurance authorization.     Recommendations  Diet recommendations: Thin liquid (Provale cup - 10 cc water) Liquids provided via: Cup;Straw;Teaspoon Medication Administration: Whole meds with puree Supervision: Full supervision/cueing for compensatory strategies Compensations: Slow rate;Small sips/bites Postural Changes and/or Swallow Maneuvers: Seated upright 90 degrees;Upright 30-60 min after meal                  Oral care BID;Staff/trained caregiver to provide oral care   Frequent or constant Supervision/Assistance Dysphagia, oropharyngeal phase (R13.12)     Continue with current plan of care    Rolena Infante, MS [facility] SLP Acute Rehab Services Office [phone]   Chales Abrahams  12/08/2022, 12:21 PM

## 2022-12-08 NOTE — Evaluation (Signed)
Physical Therapy Evaluation Patient Details Name: Scott Bass MRN: [id number] DOB: [birth year] Today's Date: 12/08/2022  History of Present Illness  72 year old male presented for fall and found confused, tachycardic and constipated with labs significant for AKI with hyperkalemia and severe metabolic acidosis and Hg 3.5. LA 6.0;  CTs 12/04/2022-innumerable lytic lesions, large expansile lesion in the anterior right second rib, multiple pathologic rib fractures, expansile T4 (T3 on CT cervical spine) mass with soft tissue component extending into the central canal and left neural foramen, C7 transverse process fracture, displaced fracture of the lower RIGHT scapula, of uncertain age. PHMx: blindness (can see some, but not details), recently diagnosed multiple myeloma in May 2024 but has not started therapy (diffuse lesions) , HTN, DM  Clinical Impression  Pt admitted with above diagnosis.  Pt currently with functional limitations due to the deficits listed below (see PT Problem List). Pt will benefit from acute skilled PT to increase their independence and safety with mobility to allow discharge.   The patient reporting that he feels dizzy after standing to transfer to [facility]. Patient  with noted tremors of arms and   legs. Patient reporting needing a BM. Patient resides with  wife and is independent in his home, does not [place] AD. Patient currently unsteady in standing , requiring mod support..  Patient will benefit from intensive inpatient follow up therapy, >3 hours/day          If plan is discharge home, recommend the following: A little help with walking and/or transfers;A little help with bathing/dressing/bathroom;Assistance with cooking/housework;Assist for transportation;Help with stairs or ramp for entrance   Can travel by private vehicle        Equipment Recommendations None recommended by PT  Recommendations for Other Services       Functional Status Assessment Patient has had a  recent decline in their functional status and demonstrates the ability to make significant improvements in function in a reasonable and predictable amount of time.     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); wife totally blind Restrictions Weight Bearing Restrictions: No      Mobility  Bed Mobility               General bed mobility comments: Pt up in recliner upon arrival    Transfers Overall transfer level: Needs assistance Equipment used: Rolling walker (2 wheels), None Transfers: Sit to/from Stand, Bed to chair/wheelchair/BSC Sit to Stand: Min assist, +2 safety/equipment Stand pivot transfers: Min assist, +2 safety/equipment         General transfer comment: multimodal cues, tactile guidance for hand placement onto RW then to reach for [facility] and then back to recliner    Ambulation/Gait                  Stairs            Wheelchair Mobility     Tilt Bed    Modified Rankin (Stroke Patients Only)       Balance Overall balance assessment: Needs assistance Sitting-balance support: No upper extremity supported, Feet supported Sitting balance-Leahy Scale: Good     Standing balance support: Bilateral upper extremity supported, Reliant on assistive device for balance Standing balance-Leahy Scale: Poor Standing balance comment: patient shaking and trmors when standing                             Pertinent Vitals/Pain Pain Assessment  Pain Assessment: No/denies pain    Home Living Family/patient expects to be discharged to:: Private residence Living Arrangements: Spouse/significant other Available Help at Discharge: Family;Available 24 hours/day Type of Home: House Home Access: Level entry       Home Layout: One level        Prior Function               Mobility Comments: indep, uses cane occasionally, reports difficulty getting down and up off  commode due to low surface height ADLs Comments: pt reports independence, doesn't drive anymore and doesn't help as much with the cooking1, sponge bath     Hand Dominance   Dominant Hand: Right    Extremity/Trunk Assessment   Upper Extremity Assessment Upper Extremity Assessment: Defer to OT evaluation (noted  arms are shakey and tremulous)    Lower Extremity Assessment Lower Extremity Assessment: Generalized weakness    Cervical / Trunk Assessment Cervical / Trunk Assessment: Other exceptions Cervical / Trunk Exceptions: patient's trunk  tremoring  after transferred to [facility]  Communication   Communication: No difficulties  Cognition Arousal/Alertness: Awake/alert Behavior During Therapy: WFL for tasks assessed/performed                                   General Comments: follows directions, did  become shakey, stating that he was dizzy        General Comments      Exercises     Assessment/Plan    PT Assessment Patient needs continued PT services  PT Problem List Decreased strength;Decreased activity tolerance       PT Treatment Interventions DME instruction;Therapeutic activities;Functional mobility training;Patient/family education    PT Goals (Current goals can be found in the Care Plan section)  Acute Rehab PT Goals Patient Stated Goal: go home PT Goal Formulation: With patient/family Time For Goal Achievement: 12/22/22 Potential to Achieve Goals: Good    Frequency Min 1X/week     Co-evaluation PT/OT/SLP Co-Evaluation/Treatment: Yes Reason for Co-Treatment: For patient/therapist safety;To address functional/ADL transfers PT goals addressed during session: Mobility/safety with mobility;Balance;Strengthening/ROM;Proper use of DME OT goals addressed during session: Strengthening/ROM;ADL's and self-care       AM-PAC PT "6 Clicks" Mobility  Outcome Measure Help needed turning from your back to your side while in a flat bed without using  bedrails?: A Little Help needed moving from lying on your back to sitting on the side of a flat bed without using bedrails?: A Little Help needed moving to and from a bed to a chair (including a wheelchair)?: A Lot Help needed standing up from a chair using your arms (e.g., wheelchair or bedside chair)?: A Lot Help needed to walk in hospital room?: Total Help needed climbing 3-5 steps with a railing? : Total 6 Click Score: 12    End of Session Equipment Utilized During Treatment: Gait belt Activity Tolerance: Treatment limited secondary to medical complications (Comment) (more shakey, reported dizziness) Patient left: in chair;with call bell/phone within reach;with chair alarm set;with family/visitor present Nurse Communication: Mobility status PT Visit Diagnosis: Unsteadiness on feet (R26.81);Other abnormalities of gait and mobility (R26.89);Difficulty in walking, not elsewhere classified (R26.2)    Time: 7564-3329 PT Time Calculation (min) (ACUTE ONLY): 27 min   Charges:   PT Evaluation $PT Eval Low Complexity: 1 Low   PT General Charges $$ ACUTE PT VISIT: 1 Visit         Blanchard Kelch PT Acute  Rehabilitation Services Office [phone] Weekend [phone]   Rada Hay 12/08/2022, 2:19 PM

## 2022-12-08 NOTE — Consult Note (Signed)
  This 72 years old wheelchair-bound male with PMH significant for blindness, recently diagnosed multiple myeloma in May 2024 but has not started therapy yet, hypertension, diabetes presented s/p fall and found to be confused, tachycardic and constipated with labs significant for AKI with hyperkalemia and severe metabolic acidosis. He was also found to have hemoglobin 3.5, lactic acid 6.0. Patient was initially admitted in the ICU and started on broad-spectrum antibiotics and fluid resuscitation. Patient has received 3 units of packed red blood cells hemoglobin 7.5. TRH pickup 12/06/2022.   Psychiatry consult placed for agitation, aggressive behavior.  After speaking with primary team specific needs are geared towards medication management for suspected agitated delirium.  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 The patient would strongly benefit from medical treatment of UTI, as well as further investigation for etiologies of delirium, including thyroid, folate and B12. During this time period, minimization of delirogenic insults will be of utmost importance; this includes promoting the normal circadian cycle, minimizing lines/tubes, avoiding deliriogenic medications such as benzodiazepines and anticholinergic medications, and frequently reorienting the patient. Symptomatic treatment for agitation can be provided by antipsychotic medications, though it is important to remember that these do not treat the underlying etiology of delirium. Notably, there can be a time lag effect between treatment of a medical problem and resolution of delirium. This time lag effect may be of longer duration in the elderly, and those with underlying cognitive impairment or brain injury as in this pt. At this point in time we are  not recommending inpt psych hospitalization when medically stable.   Patient seen and assessed, calm and cooperative very pleasant.  Patient is able to optimize,, follow commands and track voice around the room.  He remains disoriented, and is alert x 2 location self only.  He tells me it is" March and April May and ", year in 1924, and unable to recall the name of the hospital.  He and wife both deny any behavioral disturbances at this time, wife is noted to be sitting at the bedside also with visual limitations eating lunch.  As per primary team patient has improved, scheduled Haldol has been changed to as needed Haldol.    Psychiatric consult service will sign off at this time. -Continue delirium precautions. -Continue Haldol 5 mg IV 3 times as needed.  -Labs reviewed show patient remains critically ill, and will refrain from nephrotoxic medications at this time.

## 2022-12-08 NOTE — Progress Notes (Signed)
Daily Progress Note   Patient Name: Scott Bass       Date: 12/08/2022 DOB: 07/17/1950  Age: 72 y.o. MRN#: [id number] Attending Physician: Willeen Niece, MD Primary Care Physician: Tracey Harries, MD Admit Date: 12/04/2022 Length of Stay: 4 days  Reason for Consultation/Follow-up: Establishing goals of care  Subjective:   CC: Patient sitting up in bedside chair. Following up regarding complex medical decision making.   Subjective:  Reviewed EMR prior to presenting to bedside.  Patient started on chemotherapy yesterday. PT consulted to assist with evaluation.   To do bedside to meet with patient.  Patient seen sitting up in chair.  Patient much more interactive today though still slow to respond at times.  Patient's wife present with patient.  Again introduced myself as a member of the palliative medicine team.  Spent time encouraging working with physical therapy and working to regain strength if wanting to participate with chemotherapy moving forward.  Again emphasized patient may need rehab to regain strength after hospitalization if that is what physical therapy recommends.  Patient denied any symptoms of concern at this time.  All questions answered at that time.  Noted palliative medicine team will continue to follow with patient's medical journey.  Thank patient and his wife for allowing me to visit with them today.  Review of Systems No symptom concerns stated during visit Objective:   Vital Signs:  BP (!) 162/82   Pulse (!) 106   Temp 97.7 F (36.5 C) (Axillary)   Resp 18   Ht 5\' 6"  (1.676 m)   Wt 71.2 kg   SpO2 99%   BMI 25.34 kg/m   Physical Exam: General: NAD, sitting up in chair chronically ill appearing  Eyes: No drainage noted HENT: Dry mucous membranes Cardiovascular: RRR Respiratory: no increased work of breathing noted, not in respiratory distress Abdomen: not distended Neuro: calm, awake, interactive though slow to respond at times  Imaging:  I  personally reviewed recent imaging.   Assessment & Plan:   Assessment: Patient is a 72 year old male with a past medical history of wheelchair-bound status, legally blind, CKD, GERD, OSA, hypertension, diabetes mellitus, and recent diagnosis of multiple myeloma who was admitted on 12/04/2022 after a fall at home.  Upon admission, patient was found to have AKI on CKD, encephalopathy, and multiple electrolyte imbalances.  Patient seen by oncology and nephrology during hospitalization.  Patient was supposed to follow-up with oncology on 6/12 though missed this appointment and so did not begin therapies for his multiple myeloma.  Palliative medicine team consulted to assist with complex medical decision making   Recommendations/Plan: # Complex medical decision making/goals of care:  - Continuing open conversations between patient, wife and providers regarding appropraite medical interventions moving forward.Continuing chemotherapy at this time. PT consulted to assist with evaluation for rehab.   -  Code Status: DNR  # Symptom management:  Non-pharmacologic Delirium Precautions  Patient's delirium slowly improving with improvement of medical concerns, particularly improved of kidney function.   - Frequent re-orientation; update board w/ date, names of treatment team  - Daytime stimulation: Open curtains, turn lights on, and turn on television as tolerated during waking hours  - Nighttime calm - close curtains, turn lights off, and turn off television at 9pm  with minimal interruptions from treatment team during sleeping hours, including avoiding lab draws if possible  - Encourage continued presence of family/friends  - Occupy w/ distractions - e.g. ask them to fold washcloths, busy-board  - Encourage movement  with PT/OT as tolerated  - Ensure adequate sensorium, to include providing glasses, dentures, and hearing aids to patient as appropriate  - Ensure adequate nutrition and fluid intake  -  Assess and treat pain (incl nonverbal cues); e.g. consider scheduled tylenol  - Assess and treat constipation and urinary retention  - Ensure hydration, electrolyte balance (K to 4.0, Mg to 2.0), adequate oxygenation  - Review medications (addition, deletion, changes can trigger)   # Psychosocial Support:  -Wife  -Concerned about patient's support at home. Wife reports she and patient live alone. They are both blind. Wife reports some neighbors help at times though does not sound like there is consistent help to basic needs for living. SW being involved.   # Discharge Planning: To Be Determined  Discussed with: patient, patient's wife, RN  Thank you for allowing the palliative care team to participate in the care [name].  Alvester Morin, DO Palliative Care Provider PMT # [phone]  If patient remains symptomatic despite maximum doses, please call PMT at [phone] between 0700 and 1900. Outside of these hours, please call attending, as PMT does not have night coverage.  *Please note that this is a verbal dictation therefore any spelling or grammatical errors are due to the "[employer]" system interpretation.

## 2022-12-08 NOTE — Plan of Care (Signed)
  Problem: Clinical Measurements: Goal: Ability to maintain clinical measurements within normal limits will improve Outcome: Progressing Goal: Will remain free from infection Outcome: Progressing Goal: Diagnostic test results will improve Outcome: Progressing Goal: Cardiovascular complication will be avoided Outcome: Progressing   Problem: Coping: Goal: Level of anxiety will decrease Outcome: Progressing   Problem: Education: Goal: Knowledge of General Education information will improve Description: Including pain rating scale, medication(s)/side effects and non-pharmacologic comfort measures Outcome: Not Progressing   Problem: Health Behavior/Discharge Planning: Goal: Ability to manage health-related needs will improve Outcome: Not Progressing   Problem: Nutrition: Goal: Adequate nutrition will be maintained Outcome: Not Progressing   Problem: Nutritional: Goal: Maintenance of adequate nutrition will improve Outcome: Not Progressing

## 2022-12-08 NOTE — Progress Notes (Addendum)
Speech Language Pathology Treatment: Dysphagia  Patient Details Name: Scott Bass MRN: [id number] DOB: [birth year] Today's Date: 12/08/2022 Time: 0938-1829 SLP Time Calculation (min) (ACUTE ONLY): 29 min  Assessment / Plan / Recommendation Clinical Impression  Patient seen to address dysphagia management.  He is fully alert and sitting upright in his chair.  RN expressed concern about patient overtly coughing with thin liquids and therefore she downgraded his diet for safety.  Scott Bass denied pain but was confused during session, repeatedly asking where he was and why he was here.  Observed patient with consumption of thin water, thin soda, nectar thick soda, nectar thick juice, graham cracker, and applesauce.  Patient does consistently cough after larger boluses of thin liquid concerning for potential airway compromise.  No indication of aspiration with nectar thick liquids, thus agree with RN that nectar liquids would be indicated for this patient at this time.  Suspect patient has chronic dysphagia without awareness given lack of response to his overt coughing immediately post swallow of thin.  Also suspect his dysphagia is due to his deconditioning, and DISH potentially narrowing pharyngeal space and impairing adequacy of epiglottic deflection.  Use of applesauce helpful to orally transit small bolus of cracker that patient did masticate.  He declined all further solid intake.  SLP will follow-up for indication for thin water consumption between meals for his quality of life and comfort.  No no family present to educate but posted swallow signs in the room.  He will require full supervision for all meals for safety  Patient is legally blind but does benefit from all lights being on during the day.  HPI HPI: Briefly, 72 year old wheel chair bound male with blindness, recently diagnosed multiple myeloma in May 2024 but not has started therapy, HTN, DM who presented for fall and found confused,  tachycardic and constipated with labs significant for AKI with hyperkalemia and severe metabolic acidosis and Hg 3.5. LA 6.0. PCCM consulted for admission. In the ED started on broad spectrum abx and fluid resuscitation; CT chest indicated No acute findings within the chest, abdomen or pelvis. No  evidence of solid organ injury. No hemorrhage or edema within the  mediastinum. No pleural effusion or pneumothorax ; ST consulted for swallow evaluation d/t dysphagia noted with liquids per nursing report.      SLP Plan  Continue with current plan of care      Recommendations for follow up therapy are one component of a multi-disciplinary discharge planning process, led by the attending physician.  Recommendations may be updated based on patient status, additional functional criteria and insurance authorization.    Recommendations  Diet recommendations: Dysphagia 2 (fine chop);Nectar-thick liquid (Thin water okay between meals in prevail cup.) Liquids provided via: Cup;Straw;Teaspoon Medication Administration: Whole meds with puree Supervision: Full supervision/cueing for compensatory strategies Compensations: Slow rate;Small sips/bites (Use pure to help orally transit solids) Postural Changes and/or Swallow Maneuvers: Seated upright 90 degrees;Upright 30-60 min after meal                  Oral care BID;Staff/trained caregiver to provide oral care   Frequent or constant Supervision/Assistance Dysphagia, oropharyngeal phase (R13.12)     Continue with current plan of care   Scott Infante, MS [facility] SLP Acute Rehab Services Office [phone]   Scott Bass  12/08/2022, 12:15 PM

## 2022-12-09 DIAGNOSIS — E440 Moderate protein-calorie malnutrition: Secondary | ICD-10-CM | POA: Insufficient documentation

## 2022-12-09 DIAGNOSIS — N170 Acute kidney failure with tubular necrosis: Secondary | ICD-10-CM | POA: Diagnosis not present

## 2022-12-09 DIAGNOSIS — N184 Chronic kidney disease, stage 4 (severe): Secondary | ICD-10-CM | POA: Diagnosis not present

## 2022-12-09 LAB — GLUCOSE, CAPILLARY
Glucose-Capillary: 149 mg/dL — ABNORMAL HIGH (ref 70–99)
Glucose-Capillary: 176 mg/dL — ABNORMAL HIGH (ref 70–99)
Glucose-Capillary: 176 mg/dL — ABNORMAL HIGH (ref 70–99)
Glucose-Capillary: 199 mg/dL — ABNORMAL HIGH (ref 70–99)
Glucose-Capillary: 194 mg/dL — ABNORMAL HIGH (ref 70–99)
Glucose-Capillary: 116 mg/dL — ABNORMAL HIGH (ref 70–99)

## 2022-12-09 NOTE — Plan of Care (Signed)
  Problem: Education: Goal: Knowledge of General Education information will improve Description: Including pain rating scale, medication(s)/side effects and non-pharmacologic comfort measures Outcome: Not Progressing   Problem: Health Behavior/Discharge Planning: Goal: Ability to manage health-related needs will improve Outcome: Not Progressing   Problem: Coping: Goal: Level of anxiety will decrease Outcome: Not Progressing   Problem: Coping: Goal: Ability to adjust to condition or change in health will improve Outcome: Not Progressing

## 2022-12-09 NOTE — Progress Notes (Signed)
Patient is becoming more clear. He remains oriented to self and now is able to tell me that he is at [facility]. Foley removed. Wife is at bedisde.

## 2022-12-09 NOTE — Progress Notes (Signed)
Patient has been incontinent of urine x2 since having foley removed.

## 2022-12-09 NOTE — Progress Notes (Signed)
PROGRESS NOTE    Scott Bass  OZH:[id number] DOB: 09/19/1950 DOA: 12/04/2022 PCP: Tracey Harries, MD   Brief Narrative:  This 72 years old wheelchair-bound male with PMH significant for blindness, recently diagnosed multiple myeloma in May 2024 but has not started therapy yet, hypertension, diabetes presented s/p fall and found to be confused, tachycardic and constipated with labs significant for AKI with hyperkalemia and severe metabolic acidosis.  He was also found to have hemoglobin 3.5, lactic acid 6.0.  Patient was initially admitted in the ICU and started on broad-spectrum antibiotics and fluid resuscitation.  Patient has received 3 units of packed red blood cells hemoglobin 7.5.  TRH pickup 12/06/2022.  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  Not a candidate for hemodialysis given advanced malignancy, comorbidity and functional status. Serum creatinine improving 11.76 > 11.06 >10.40 > 9.52> 9.10 >8.48 > 7.50 >6.78 > 5.13 >4.01 Continue IV fluid resuscitation.  Hyperkalemia: Resolved with calcium gluconate, Lasix, insulin and D50.  Hyponatremia: resolved with IV hydration.  Metabolic acidosis: Resolved with sodium bicarbonate infusion.  Lactic acidosis > Improving. Continue IV fluid resuscitation.   Lactic acid improving 6.0> 2.7  Acute blood loss anemia likely secondary to multiple myeloma: S/p 2 unit PRBC.  Hemoglobin 7.4 >8.2 Keep hemoglobin above 7.  Tachycardia:  Likely multifactorial due to anemia, dehydration, suspected sepsis. One of the 4 blood culture showed Staphylococcus Capitis.  Likely contaminant  Multiple myeloma with diffuse pathological fractures Mild thrombocytopenia: Oncology is consulted recommended Decadron and Velcade. Palliative consulted to discuss goals of care.  Large mass involving T4 vertebral body with soft tissue component: MRI with and without C and T-spine when renal functions improves.  Acute metabolic encephalopathy Likely secondary to multiple myeloma, uremia and others. Minimize sedation,  reorient as above. Continue Haldol as needed for agitation and restlessness. Psychiatry consulted for agitation, restlessness.   Constipation: Aggressive bowel regimen.  Hypothyroidism: Continue Synthroid.  DVT prophylaxis: SCDs Code Status: Full code Family Communication: No family at bed side. Disposition Plan:    Status is: Inpatient Remains inpatient appropriate because: Admitted for AKI on CKD stage IV in the setting of multiple myeloma,  not been treated. AKI is improving,  not a candidate for hemodialysis.   Consultants:  PCCM Nephrology  Procedures:None  Antimicrobials:  Anti-infectives (From admission, onward)    Start     Dose/Rate Route Frequency Ordered Stop   12/05/22 1000  cefTRIAXone (ROCEPHIN) 2 g in sodium chloride 0.9 % 100 mL IVPB        2 g 200 mL/hr over 30 Minutes Intravenous Every 24 hours 12/04/22 1340 12/08/22 1008   12/04/22 0600  ceFEPIme (MAXIPIME) 2 g in sodium chloride 0.9 % 100 mL IVPB        2 g 200 mL/hr over 30 Minutes Intravenous  Once 12/04/22 0557 12/04/22 0726   12/04/22 0600  metroNIDAZOLE (FLAGYL) IVPB 500 mg        500 mg 100 mL/hr over 60 Minutes Intravenous  Once 12/04/22 0557 12/04/22 0829   12/04/22 0600  vancomycin (VANCOCIN) IVPB 1000 mg/200 mL premix        1,000 mg 200 mL/hr over 60 Minutes Intravenous  Once 12/04/22 0557 12/04/22 5284       Subjective: Patient was seen and examined at bedside. Overnight events noted.  Patient seems improved.  Overnight patient was agitated and restless was placed in Posey bed.   Objective: Vitals:   12/09/22 0700 12/09/22 0800 12/09/22 0858 12/09/22 1028  BP: (!) 163/60 (!) 170/83  (!) 170/71  Pulse: 75 99  85  Resp: 11 (!) 23  11  Temp:  98.1 F (36.7 C) 98.2 F (36.8 C)   TempSrc:  Oral Oral   SpO2: 98% 98%  94%  Weight:      Height:        Intake/Output Summary (Last 24 hours) at 12/09/2022 1050 Last data filed at 12/09/2022 1000 Gross per 24 hour  Intake 2014.92 ml  Output 3125 ml  Net -1110.08 ml   Filed Weights   12/07/22 0500 12/08/22 0328 12/09/22 0437  Weight: 68 kg 71.2 kg 71.4 kg    Examination:  General exam: Appears comfortable, deconditioned, not in any acute distress. Respiratory system: CTA bilaterally. Respiratory effort normal. RR 14 Cardiovascular system: S1 & S2 heard, RRR. No JVD, murmurs, rubs, gallops or clicks. Gastrointestinal system: Abdomen is soft, non tender, non distended, bowel sounds present Central nervous system: Alert and oriented x 2. No focal neurological deficits. Extremities: No edema, no cyanosis, no clubbing  Skin: No rashes, lesions or ulcers Psychiatry:  Mood & affect appropriate.     Data Reviewed: I have personally reviewed following labs and imaging studies  CBC: Recent Labs  Lab 12/04/22 0827 12/04/22 0918 12/04/22 1635 12/05/22 0100 12/06/22 0932 12/07/22 0327 12/08/22 0321 12/09/22 0323  WBC 4.4 5.7  --  4.1 4.9 5.1  --  6.5  NEUTROABS 2.9 3.1  --   --  3.9 4.0  --   --   HGB 3.5* 3.5*   < > 7.4* 9.2* 8.2* 7.8* 8.1*  HCT 11.8* 11.7*   < > 23.0* 29.3* 26.4* 24.8* 26.3*  MCV 99.2 96.7  --  90.6 91.8 92.3  --  93.9  PLT 131* 137*  --  115* 106* 116*  --  115*   < > = values in this interval not displayed.   Basic Metabolic Panel: Recent Labs  Lab 12/05/22 0100 12/05/22 0655 12/05/22 2122 12/06/22 0615  12/07/22 0501 12/08/22 0321 12/09/22 0323  NA 132*   < > 136 135 140 142 141  142  K 3.9   < > 4.7 5.0 4.1 4.0 3.6  3.6  CL 98   < > 97* 95* 101 108 106  107  CO2 22   < > 26 28 26 24 22  22   GLUCOSE 214*   < > 135* 202* 146* 185* 168*  169*  BUN 113*   < > 103* 98* 111* 106* 99*  99*  CREATININE 9.52*   < > 8.48* 7.50* 6.78* 5.13* 4.01*  4.09*  CALCIUM 8.2*   < > 7.6* 7.6* 7.5* 7.1* 7.2*  7.1*  MG 3.6*  --   --   --  3.7*  --  2.8*  PHOS  --   --   --   --  5.8*  --  4.0  4.0   < > = values in this interval not displayed.   GFR:  Estimated Creatinine Clearance: 14.9 mL/min (A) (by C-G formula based on SCr of 4.09 mg/dL (H)). Liver Function Tests: Recent Labs  Lab 12/04/22 0515 12/06/22 0615 12/09/22 0323  AST 35 31  --   ALT 28 19  --   ALKPHOS 62 58  --   BILITOT 1.0 0.9  --   PROT >12.0* 9.4*  --   ALBUMIN 2.6* 1.9* 2.0*   No results for input(s): "LIPASE", "AMYLASE" in the last 168 hours. No results for input(s): "AMMONIA" in the last 168 hours. Coagulation Profile: Recent Labs  Lab 12/04/22 0515  INR 1.5*   Cardiac Enzymes: Recent Labs  Lab 12/04/22 0515  CKTOTAL 66   BNP (last 3 results) No results for input(s): "PROBNP" in the last 8760 hours. HbA1C: No results for input(s): "HGBA1C" in the last 72 hours. CBG: Recent Labs  Lab 12/08/22 1551 12/08/22 1952 12/08/22 2334 12/09/22 0357 12/09/22 0753  GLUCAP 161* 158* 187* 149* 176*   Lipid Profile: No results for input(s): "CHOL", "HDL", "LDLCALC", "TRIG", "CHOLHDL", "LDLDIRECT" in the last 72 hours. Thyroid Function Tests: No results for input(s): "TSH", "T4TOTAL", "FREET4", "T3FREE", "THYROIDAB" in the last 72 hours. Anemia Panel: No results for input(s): "VITAMINB12", "FOLATE", "FERRITIN", "TIBC", "IRON", "RETICCTPCT" in the last 72 hours. Sepsis Labs: Recent Labs  Lab 12/04/22 0529 12/04/22 0819 12/04/22 0918 12/05/22 1219 12/06/22 0615 12/07/22 0501 12/08/22 0321  PROCALCITON   --   --    < > 5.01 3.72 2.42 1.23  LATICACIDVEN 6.0* 2.7*  --   --   --   --   --    < > = values in this interval not displayed.    Recent Results (from the past 240 hour(s))  Blood Culture (routine x 2)     Status: Abnormal   Collection Time: 12/04/22  6:16 AM   Specimen: BLOOD  Result Value Ref Range Status   Specimen Description   Final    BLOOD RIGHT ANTECUBITAL Performed at [facility], [address], [place], [place] [address]    Special Requests   Final    BOTTLES DRAWN AEROBIC AND ANAEROBIC Blood Culture adequate volume Performed at [facility], [address], [place], [place] [address]    Culture  Setup Time   Final    GRAM POSITIVE COCCI IN CLUSTERS AEROBIC BOTTLE ONLY CRITICAL RESULT CALLED TO, READ BACK BY AND VERIFIED WITH: PHARMD M. Doran Durand 12/05/22 @ 0604 BY AB    Culture (A)  Final    STAPHYLOCOCCUS CAPITIS THE SIGNIFICANCE OF ISOLATING THIS ORGANISM FROM A SINGLE SET OF BLOOD CULTURES WHEN MULTIPLE SETS ARE DRAWN IS UNCERTAIN. PLEASE NOTIFY THE MICROBIOLOGY DEPARTMENT WITHIN ONE WEEK IF SPECIATION AND SENSITIVITIES ARE REQUIRED. Performed at [facility], [address], [place], [place] [address]    Report Status 12/06/2022 FINAL  Final  Blood Culture ID Panel (Reflexed)     Status: Abnormal   Collection Time: 12/04/22  6:16 AM  Result Value Ref Range Status   Enterococcus faecalis NOT DETECTED NOT DETECTED Final   Enterococcus Faecium NOT DETECTED NOT DETECTED Final   Listeria monocytogenes NOT DETECTED NOT DETECTED Final   Staphylococcus species DETECTED (A) NOT DETECTED Final    Comment: CRITICAL RESULT CALLED TO, READ BACK BY AND VERIFIED WITH: PHARMD M. LILLISTON 12/05/22 @ 0604 BY AB    Staphylococcus aureus (BCID) NOT DETECTED NOT DETECTED Final   Staphylococcus epidermidis NOT DETECTED NOT DETECTED Final   Staphylococcus lugdunensis NOT  DETECTED NOT DETECTED Final   Streptococcus species NOT  DETECTED NOT DETECTED Final   Streptococcus agalactiae NOT DETECTED NOT DETECTED Final   Streptococcus pneumoniae NOT DETECTED NOT DETECTED Final   Streptococcus pyogenes NOT DETECTED NOT DETECTED Final   A.calcoaceticus-baumannii NOT DETECTED NOT DETECTED Final   Bacteroides fragilis NOT DETECTED NOT DETECTED Final   Enterobacterales NOT DETECTED NOT DETECTED Final   Enterobacter cloacae complex NOT DETECTED NOT DETECTED Final   Escherichia coli NOT DETECTED NOT DETECTED Final   Klebsiella aerogenes NOT DETECTED NOT DETECTED Final   Klebsiella oxytoca NOT DETECTED NOT DETECTED Final   Klebsiella pneumoniae NOT DETECTED NOT DETECTED Final   Proteus species NOT DETECTED NOT DETECTED Final   Salmonella species NOT DETECTED NOT DETECTED Final   Serratia marcescens NOT DETECTED NOT DETECTED Final   Haemophilus influenzae NOT DETECTED NOT DETECTED Final   Neisseria meningitidis NOT DETECTED NOT DETECTED Final   Pseudomonas aeruginosa NOT DETECTED NOT DETECTED Final   Stenotrophomonas maltophilia NOT DETECTED NOT DETECTED Final   Candida albicans NOT DETECTED NOT DETECTED Final   Candida auris NOT DETECTED NOT DETECTED Final   Candida glabrata NOT DETECTED NOT DETECTED Final   Candida krusei NOT DETECTED NOT DETECTED Final   Candida parapsilosis NOT DETECTED NOT DETECTED Final   Candida tropicalis NOT DETECTED NOT DETECTED Final   Cryptococcus neoformans/gattii NOT DETECTED NOT DETECTED Final    Comment: Performed at [facility], [address], [place], [place] [address]  Blood Culture (routine x 2)     Status: None   Collection Time: 12/04/22  6:30 AM   Specimen: BLOOD  Result Value Ref Range Status   Specimen Description   Final    BLOOD LEFT ANTECUBITAL Performed at [facility], [address], [place], [place] [address]    Special Requests   Final    BOTTLES DRAWN AEROBIC AND ANAEROBIC Blood Culture adequate volume Performed at [facility], [address] [place], [place] [address]    Culture   Final    NO GROWTH 5 DAYS Performed at [facility], [address], [place], [place] [address]    Report Status 12/09/2022 FINAL  Final  Resp panel by RT-PCR (RSV, Flu A&B, Covid) Anterior Nasal Swab     Status: None   Collection Time: 12/04/22  8:15 AM   Specimen: Anterior Nasal Swab  Result Value Ref Range Status   SARS Coronavirus 2 by RT PCR NEGATIVE NEGATIVE Final    Comment: (NOTE) SARS-CoV-2 target nucleic acids are NOT DETECTED.  The SARS-CoV-2 RNA is generally detectable in upper respiratory specimens during the acute phase of infection. The lowest concentration of SARS-CoV-2 viral copies this assay can detect is 138 copies/mL. A negative result does not preclude SARS-Cov-2 infection and should not be used as the sole basis for treatment or other patient management decisions. A negative result may occur with  improper specimen collection/handling, submission of specimen other than nasopharyngeal swab, presence of viral mutation(s) within the areas targeted by this assay, and inadequate number of viral copies(<138 copies/mL). A negative result must be combined with clinical observations, patient history, and epidemiological information. The expected result is Negative.  Fact Sheet for Patients:  BloggerCourse.com  Fact Sheet for Healthcare Providers:  SeriousBroker.it  This test is no t yet approved or cleared by the [place] FDA and  has been authorized for detection and/or diagnosis of SARS-CoV-2 by FDA under an Emergency Use Authorization (EUA).  This EUA will remain  in effect (meaning this test can be used) for the duration of the COVID-19 declaration under Section 564(b)(1) of the Act, 21 U.S.C.section 360bbb-3(b)(1), unless the authorization is terminated  or revoked sooner.       Influenza A by PCR NEGATIVE NEGATIVE Final    Influenza B by PCR NEGATIVE NEGATIVE Final    Comment: (NOTE) The Xpert Xpress SARS-CoV-2/FLU/RSV plus assay is intended as an aid in the diagnosis of influenza from Nasopharyngeal swab specimens and should not be used as a sole basis for treatment. Nasal washings and aspirates are unacceptable for Xpert Xpress SARS-CoV-2/FLU/RSV testing.  Fact Sheet for Patients: BloggerCourse.com  Fact Sheet for Healthcare Providers: SeriousBroker.it  This test is not yet approved or cleared by the [place] FDA and has been authorized for detection and/or diagnosis of SARS-CoV-2 by FDA under an Emergency Use Authorization (EUA). This EUA will remain in effect (meaning this test can be used) for the duration of the COVID-19 declaration under Section 564(b)(1) of the Act, 21 U.S.C. section 360bbb-3(b)(1), unless the authorization is terminated or revoked.     Resp Syncytial Virus by PCR NEGATIVE NEGATIVE Final    Comment: (NOTE) Fact Sheet for Patients: BloggerCourse.com  Fact Sheet for Healthcare Providers: SeriousBroker.it  This test is not yet approved or cleared by the [place] FDA and has been authorized for detection and/or diagnosis of SARS-CoV-2 by FDA under an Emergency Use Authorization (EUA). This EUA will remain in effect (meaning this test can be used) for the duration of the COVID-19 declaration under Section 564(b)(1) of the Act, 21 U.S.C. section 360bbb-3(b)(1), unless the authorization is terminated or revoked.  Performed at [facility], [address], [place], [place] [address]   MRSA Next Gen by PCR, Nasal     Status: None   Collection Time: 12/04/22 10:57 AM   Specimen: Nasal Mucosa; Nasal Swab  Result Value Ref Range Status   MRSA by PCR Next Gen NOT DETECTED NOT DETECTED Final    Comment: (NOTE) The GeneXpert MRSA Assay (FDA approved for  NASAL specimens only), is one component of a comprehensive MRSA colonization surveillance program. It is not intended to diagnose MRSA infection nor to guide or monitor treatment for MRSA infections. Test performance is not FDA approved in patients less than 63 years old. Performed at [facility], [address], [place], [place] [address]   Culture, blood (Routine X 2) w Reflex to ID Panel     Status: None (Preliminary result)   Collection Time: 12/06/22  6:15 AM   Specimen: BLOOD  Result Value Ref Range Status   Specimen Description   Final    BLOOD BLOOD RIGHT ARM Performed at [facility], [address] [place], [place] [address]    Special Requests   Final    BOTTLES DRAWN AEROBIC ONLY Blood Culture adequate volume Performed at [facility], [address], [place], [place] [address]    Culture   Final    NO GROWTH 3 DAYS Performed at [facility], [address], [place], [place] [address]    Report Status PENDING  Incomplete  Culture, blood (Routine X 2) w Reflex to ID Panel     Status: None (Preliminary result)   Collection Time: 12/06/22  6:30 AM   Specimen: BLOOD  Result Value Ref Range Status   Specimen Description   Final    BLOOD BLOOD LEFT FOREARM Performed at  [facility], [address], [place], [place] [address]    Special Requests   Final    BOTTLES DRAWN AEROBIC ONLY Blood Culture adequate volume Performed at [facility], [address] [place], [place] [address]    Culture   Final    NO GROWTH 3 DAYS Performed at [facility], [address] [place], [place] [address]    Report Status PENDING  Incomplete   Radiology Studies: No results found.  Scheduled Meds:  amLODipine  5 mg Oral Daily   brimonidine  1 drop Both Eyes TID   Chlorhexidine Gluconate Cloth  6 each Topical Q2200   ferrous sulfate  325 mg Oral QODAY   insulin aspart  0-15  Units Subcutaneous Q4H   latanoprost  1 drop Both Eyes QHS   levothyroxine  150 mcg Oral Q0600   pantoprazole  40 mg Oral BID   polyethylene glycol  17 g Oral Daily   sennosides  15 mL Oral BID   tamsulosin  0.4 mg Oral Daily   Continuous Infusions:  lactated ringers 75 mL/hr at 12/09/22 1000     LOS: 5 days    Time spent: 35 mins    Willeen Niece, MD [facility]   If 7PM-7AM, please contact night-coverage

## 2022-12-09 NOTE — Progress Notes (Signed)
Mr. Kilburg is [age] year old man who presented after he was found confused after a fall. His labs were significant for AKI with hyperkalemia and severe metabolic acidosis. His CT scans on 12/04/22 show multiple lytic lesions.  Patient would be an excellent CIR candidate.  He is currently unsteady in standing and requiring modA support, whereas he was previously independent at home.  His orientation is improving. His wife is able to provide support. His creatinine and appetite are improving and his foley is being removed  His blood pressure continues to be elevated and he was tachypneic today- he would benefit from TID vitals checks in CIR

## 2022-12-09 NOTE — Progress Notes (Signed)
[facility] Progress Note   Subjective:   Cr cont to improve. I/Os yest 2.7 / 3.17.  po intake improving.    Objective Vitals:   12/09/22 0800 12/09/22 0858 12/09/22 1028 12/09/22 1100  BP: (!) 170/83  (!) 170/71 (!) 148/73  Pulse: 99  85 82  Resp: (!) 23  11 13   Temp: 98.1 F (36.7 C) 98.2 F (36.8 C)    TempSrc: Oral Oral    SpO2: 98%  94% 95%  Weight:      Height:       Physical Exam Gen:  awake and calm in bed ENT: MMM CV:  RRR without rub Abd:  soft, nontender Lungs: clear on RA GU: foley draining clear yellow urine Extr:  no edema Neuro: awake and calm, no deficits noted; blind Skin: warm and dry  Additional Objective Labs: Basic Metabolic Panel: Recent Labs  Lab 12/07/22 0501 12/08/22 0321 12/09/22 0323  NA 140 142 141  142  K 4.1 4.0 3.6  3.6  CL 101 108 106  107  CO2 26 24 22  22   GLUCOSE 146* 185* 168*  169*  BUN 111* 106* 99*  99*  CREATININE 6.78* 5.13* 4.01*  4.09*  CALCIUM 7.5* 7.1* 7.2*  7.1*  PHOS 5.8*  --  4.0  4.0   Liver Function Tests: Recent Labs  Lab 12/04/22 0515 12/06/22 0615 12/09/22 0323  AST 35 31  --   ALT 28 19  --   ALKPHOS 62 58  --   BILITOT 1.0 0.9  --   PROT >12.0* 9.4*  --   ALBUMIN 2.6* 1.9* 2.0*   No results for input(s): "LIPASE", "AMYLASE" in the last 168 hours. CBC: Recent Labs  Lab 12/04/22 0918 12/04/22 1635 12/05/22 0100 12/06/22 0932 12/07/22 0327 12/08/22 0321 12/09/22 0323  WBC 5.7  --  4.1 4.9 5.1  --  6.5  NEUTROABS 3.1  --   --  3.9 4.0  --   --   HGB 3.5*   < > 7.4* 9.2* 8.2* 7.8* 8.1*  HCT 11.7*   < > 23.0* 29.3* 26.4* 24.8* 26.3*  MCV 96.7  --  90.6 91.8 92.3  --  93.9  PLT 137*  --  115* 106* 116*  --  115*   < > = values in this interval not displayed.   Blood Culture    Component Value Date/Time   SDES  12/06/2022 0630    BLOOD BLOOD LEFT FOREARM Performed at [facility], [address] [place], [place] [address]    SPECREQUEST   12/06/2022 0630    BOTTLES DRAWN AEROBIC ONLY Blood Culture adequate volume Performed at [facility], [address] [place], [place] [address]    CULT  12/06/2022 0630    NO GROWTH 3 DAYS Performed at [facility], [address], [place], [place] [address]    REPTSTATUS PENDING 12/06/2022 0630    Cardiac Enzymes: Recent Labs  Lab 12/04/22 0515  CKTOTAL 66   CBG: Recent Labs  Lab 12/08/22 1551 12/08/22 1952 12/08/22 2334 12/09/22 0357 12/09/22 0753  GLUCAP 161* 158* 187* 149* 176*   Iron Studies: No results for input(s): "IRON", "TIBC", "TRANSFERRIN", "FERRITIN" in the last 72 hours. @lablastinr3 @ Studies/Results: No results found. Medications:  lactated ringers 75 mL/hr at 12/09/22 1000    amLODipine  5 mg Oral Daily   brimonidine  1 drop Both Eyes TID   Chlorhexidine Gluconate Cloth  6  each Topical Q2200   ferrous sulfate  325 mg Oral QODAY   insulin aspart  0-15 Units Subcutaneous Q4H   latanoprost  1 drop Both Eyes QHS   levothyroxine  150 mcg Oral Q0600   pantoprazole  40 mg Oral BID   polyethylene glycol  17 g Oral Daily   sennosides  15 mL Oral BID   tamsulosin  0.4 mg Oral Daily    Assessment/Plan **AKI on CKD: recent baseline Cr ~1.5-1.7mg /dL > 7/84 3.4 > presented with Cr 11.7.   Suspect that the untreated myeloma is a major driving factor here for his AKI but certainly initially very dry.  Imaging shows no obstruction.  We'll pursue maximum medical therapy at this point but would  not consider him a candidate for dialysis in light of his advanced malignancy, comorbids, functional status (wheel chair bound and nearly bed bound recently) and would recommend against it.  He currently has no indications for dialysis at this time anyway.  Have discussed with wife no dialysis.  Having good UOP and consistent improvement in Cr --> cont max medical care for now.  BUN disproportionately up related to steroids.  Avoid nephrotoxins, maintain  euvolemia.  PO intake improved -- lowered IVF yesterday and will d/c today.  D/c foley and do TOV - RN aware.    **Anemia:  Hb 3.5 initially, rec'd 2u pRBC and is stable in the 8s.   **Myeloma:  dx 09/2022 --> saw Dr. Candise Che and PET, chemo ordered; pt frustrated by IV issue at PET in early 10/2022 and left scan (he reports), hasn't started chemo --> onc has been consulted inpatient as has palliative.  Onc following and started decadron, velcade  **HTN: BP remains elevated but improved this AM, cont amlodipine 5. Vol looks ok.    Will follow, reach out if I can further assist.    Estill Bakes MD 12/09/2022, 11:41 AM  [facility] Pager: [phone]

## 2022-12-09 NOTE — Progress Notes (Signed)
Inpatient Rehab Admissions Coordinator:    I spoke with pt.'s wife to discuss potential CIR admit. She is interested and states she can provide 24/7 min assist at d/c. I will send case to insurance and pursue for admit.   Megan Salon, MS, CCC-SLP Rehab Admissions Coordinator  [phone] (celll) [phone] (office)

## 2022-12-10 DIAGNOSIS — Z515 Encounter for palliative care: Secondary | ICD-10-CM | POA: Diagnosis not present

## 2022-12-10 DIAGNOSIS — N184 Chronic kidney disease, stage 4 (severe): Secondary | ICD-10-CM | POA: Diagnosis not present

## 2022-12-10 DIAGNOSIS — N170 Acute kidney failure with tubular necrosis: Secondary | ICD-10-CM | POA: Diagnosis not present

## 2022-12-10 DIAGNOSIS — N179 Acute kidney failure, unspecified: Secondary | ICD-10-CM | POA: Diagnosis not present

## 2022-12-10 DIAGNOSIS — Z7189 Other specified counseling: Secondary | ICD-10-CM | POA: Diagnosis not present

## 2022-12-10 DIAGNOSIS — C9000 Multiple myeloma not having achieved remission: Secondary | ICD-10-CM | POA: Diagnosis not present

## 2022-12-10 LAB — BASIC METABOLIC PANEL
Anion gap: 12 (ref 5–15)
BUN: 85 mg/dL — ABNORMAL HIGH (ref 8–23)
CO2: 22 mmol/L (ref 22–32)
Calcium: 6.8 mg/dL — ABNORMAL LOW (ref 8.9–10.3)
Chloride: 108 mmol/L (ref 98–111)
Creatinine, Ser: 3 mg/dL — ABNORMAL HIGH (ref 0.61–1.24)
GFR, Estimated: 22 mL/min — ABNORMAL LOW (ref >60)
Glucose, Bld: 206 mg/dL — ABNORMAL HIGH (ref 70–99)
Potassium: 3.4 mmol/L — ABNORMAL LOW (ref 3.5–5.1)
Sodium: 142 mmol/L (ref 135–145)

## 2022-12-10 LAB — GLUCOSE, CAPILLARY
Glucose-Capillary: 165 mg/dL — ABNORMAL HIGH (ref 70–99)
Glucose-Capillary: 120 mg/dL — ABNORMAL HIGH (ref 70–99)
Glucose-Capillary: 168 mg/dL — ABNORMAL HIGH (ref 70–99)
Glucose-Capillary: 134 mg/dL — ABNORMAL HIGH (ref 70–99)
Glucose-Capillary: 97 mg/dL (ref 70–99)
Glucose-Capillary: 141 mg/dL — ABNORMAL HIGH (ref 70–99)

## 2022-12-10 LAB — CBC
HCT: 28.1 % — ABNORMAL LOW (ref 39.0–52.0)
Hemoglobin: 8.5 g/dL — ABNORMAL LOW (ref 13.0–17.0)
MCH: 28.9 pg (ref 26.0–34.0)
MCHC: 30.2 g/dL (ref 30.0–36.0)
MCV: 95.6 fL (ref 80.0–100.0)
Platelets: 105 10*3/uL — ABNORMAL LOW (ref 150–400)
RBC: 2.94 MIL/uL — ABNORMAL LOW (ref 4.22–5.81)
RDW: 18.8 % — ABNORMAL HIGH (ref 11.5–15.5)
WBC: 4.3 10*3/uL (ref 4.0–10.5)
nRBC: 1.6 % — ABNORMAL HIGH (ref 0.0–0.2)

## 2022-12-10 MED ORDER — AMLODIPINE BESYLATE 10 MG PO TABS
10.0000 mg | ORAL_TABLET | Freq: Every day | ORAL | Status: DC
  Administered 2022-12-10: 10 mg via ORAL
  Filled 2022-12-10: qty 1

## 2022-12-10 NOTE — Progress Notes (Signed)
Daily Progress Note   Patient Name: Scott Bass       Date: 12/10/2022 DOB: [birth year]  Age: 72 y.o. MRN#: [id number] Attending Physician: Willeen Niece, MD Primary Care Physician: Tracey Harries, MD Admit Date: 12/04/2022 Length of Stay: 6 days  Reason for Consultation/Follow-up: Establishing goals of care  Subjective:   CC: Patient laying in bed comfortably this morning. Following up regarding complex medical decision making.   Subjective:  Reviewed EMR prior to presenting to bedside. Patient being consider for CIR vs SNF with rehab. Patient appears to have worsening agitation over night (sundowning) though appears to improved during the day.  Discussed care with bedside RN for updates.  Presented to bedside to check on patient.  Patient laying comfortably in bed.  Patient awake and able to answer simple questions appropriately at that time.  Patient's wife at bedside.  Inquired about symptom burden at this time.  Patient denies any symptoms of pain, nausea, or vomiting.  Patient notes only concern is feeling cold at this time so was able to provide patient and his wife with a blanket. Patient feels he is progressing working with physical therapy.  Patient willing to go to rehab to regain his strength.  Spent time providing emotional support via active listening.  All questions answered at that time.  Thanked patient and his wife for allowing me to visit with him today.  Review of Systems No symptom concerns stated during visit Objective:   Vital Signs:  BP 138/86 (BP Location: Right Arm)   Pulse (!) 101   Temp 97.9 F (36.6 C) (Oral)   Resp 12   Ht 5\' 6"  (1.676 m)   Wt 71.3 kg   SpO2 100%   BMI 25.37 kg/m   Physical Exam: General: NAD,laying in bed, chronically ill appearing  Eyes: No drainage noted HENT: Dry mucous membranes Cardiovascular: RRR Respiratory: no increased work of breathing noted, not in respiratory distress Abdomen: not distended Neuro: calm, awake,  interactive though slow to respond at times  Imaging:  I personally reviewed recent imaging.   Assessment & Plan:   Assessment: Patient is a 72 year old male with a past medical history of wheelchair-bound status, legally blind, CKD, GERD, OSA, hypertension, diabetes mellitus, and recent diagnosis of multiple myeloma who was admitted on 12/04/2022 after a fall at home.  Upon admission, patient was found to have AKI on CKD, encephalopathy, and multiple electrolyte imbalances.  Patient seen by oncology and nephrology during hospitalization.  Patient was supposed to follow-up with oncology on 6/12 though missed this appointment and so did not begin therapies for his multiple myeloma.  Palliative medicine team consulted to assist with complex medical decision making   Recommendations/Plan: # Complex medical decision making/goals of care:  - Patient and wife continuing  open conversations with providers. Patient started on chemotherapy as per oncology. Patient will to participate with PT/ go to rehab to regain strength.   -  Code Status: DNR  # Symptom management:  Non-pharmacologic Delirium Precautions  Patient's delirium slowly improving with improvement of medical concerns, particularly improved of kidney function.   - Frequent re-orientation; update board w/ date, names of treatment team  - Daytime stimulation: Open curtains, turn lights on, and turn on television as tolerated during waking hours  - Nighttime calm - close curtains, turn lights off, and turn off television at 9pm  with minimal interruptions from treatment team during sleeping hours, including avoiding lab draws if possible  - Encourage continued presence of family/friends  -  Occupy w/ distractions - e.g. ask them to fold washcloths, busy-board  - Encourage movement with PT/OT as tolerated  - Ensure adequate sensorium, to include providing glasses, dentures, and hearing aids to patient as appropriate  - Ensure adequate  nutrition and fluid intake  - Assess and treat pain (incl nonverbal cues); e.g. consider scheduled tylenol  - Assess and treat constipation and urinary retention  - Ensure hydration, electrolyte balance (K to 4.0, Mg to 2.0), adequate oxygenation  - Review medications (addition, deletion, changes can trigger)   # Psychosocial Support:  -Wife  -Concerned about patient's support at home. Wife reports she and patient live alone. They are both blind. Wife reports some neighbors help at times though does not sound like there is consistent help to basic needs for living. SW being involved.   # Discharge Planning: Likely rehab- SNF vs CIR  -Likely will need home palliative medicine referral.   Discussed with: patient, patient's wife, RN  Thank you for allowing the palliative care team to participate in the care [name].  Alvester Morin, DO Palliative Care Provider PMT # [phone]  If patient remains symptomatic despite maximum doses, please call PMT at [phone] between 0700 and 1900. Outside of these hours, please call attending, as PMT does not have night coverage.  *Please note that this is a verbal dictation therefore any spelling or grammatical errors are due to the "Dragon Medical One" system interpretation.

## 2022-12-10 NOTE — Progress Notes (Addendum)
PROGRESS NOTE    Scott Bass  UXL:[id number] DOB: Jan 09, 1951 DOA: 12/04/2022 PCP: Tracey Harries, MD   Brief Narrative:  This 72 years old wheelchair-bound male with PMH significant for blindness, recently diagnosed multiple myeloma in May 2024 but has not started therapy yet, hypertension, diabetes presented s/p fall and found to be confused, tachycardic and constipated with labs significant for AKI with hyperkalemia and severe metabolic acidosis.  He was also found to have hemoglobin 3.5, lactic acid 6.0.  Patient was initially admitted in the ICU and started on broad-spectrum antibiotics and fluid resuscitation.  Patient has received 3 units of packed red blood cells hemoglobin 7.5.  TRH pickup 12/06/2022.  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  Not a candidate for hemodialysis given advanced malignancy, comorbidity and functional status. Serum creatinine improving 11.76 > 11.06 >10.40 > 9.52> 9.10 >8.48 > 7.50 >6.78 > 5.13 >4.01 Continue IV fluid resuscitation.  Hyperkalemia: Resolved with calcium gluconate, Lasix, insulin and D50.  Hyponatremia: resolved with IV hydration.  Metabolic acidosis: Resolved with sodium bicarbonate infusion.  Lactic acidosis > Improving. Continue IV fluid resuscitation.   Lactic acid improving 6.0 > 2.7  Acute blood loss anemia likely secondary to multiple myeloma: S/p 2 unit PRBC.  Hemoglobin 7.4 >8.2 Keep hemoglobin above 7.  Tachycardia:  Likely multifactorial due to anemia, dehydration, suspected sepsis. One of the 4 blood culture showed Staphylococcus Capitis.  Likely contaminant  Multiple myeloma with diffuse pathological fractures Mild thrombocytopenia: Oncology is consulted recommended Decadron and Velcade. Palliative consulted to discuss goals of care.  Large mass involving T4 vertebral body with soft tissue component: MRI with and without C and T-spine when renal functions improves.  Acute metabolic encephalopathy Likely secondary to multiple myeloma, uremia and others. Minimize sedation,  reorient as above. Continue Haldol as needed for agitation and restlessness. Psychiatry consulted for agitation, restlessness.   Constipation: Aggressive bowel regimen.  Hypothyroidism: Continue Synthroid.  DVT prophylaxis: SCDs Code Status: DNR Family Communication: Wife at bed side. Disposition Plan:    Status is: Inpatient Remains inpatient appropriate because: Admitted for AKI on CKD stage IV in the setting of multiple myeloma,  not been treated. AKI is improving,  not a candidate for hemodialysis.   Consultants:  PCCM Nephrology  Procedures:None  Antimicrobials:  Anti-infectives (From admission, onward)    Start     Dose/Rate Route Frequency Ordered Stop   12/05/22 1000  cefTRIAXone (ROCEPHIN) 2 g in sodium chloride 0.9 % 100 mL IVPB        2 g 200 mL/hr over 30 Minutes Intravenous Every 24 hours 12/04/22 1340 12/08/22 1008   12/04/22 0600  ceFEPIme (MAXIPIME) 2 g in sodium chloride 0.9 % 100 mL IVPB        2 g 200 mL/hr over 30 Minutes Intravenous  Once 12/04/22 0557 12/04/22 0726   12/04/22 0600  metroNIDAZOLE (FLAGYL) IVPB 500 mg        500 mg 100 mL/hr over 60 Minutes Intravenous  Once 12/04/22 0557 12/04/22 0829   12/04/22 0600  vancomycin (VANCOCIN) IVPB 1000 mg/200 mL premix        1,000 mg 200 mL/hr over 60 Minutes Intravenous  Once 12/04/22 0557 12/04/22 0981      Subjective: Patient was  seen and examined at bedside. Overnight events noted.  Patient seems much improved.  He is alert and oriented,  following commands.   He seems back to his baseline mental status.   Objective: Vitals:   12/10/22 0400 12/10/22 0500 12/10/22 0800 12/10/22 0828  BP: (!) 162/56  138/86   Pulse: 85  (!) 101   Resp: 13  12   Temp:   97.9 F (36.6 C) 97.9 F (36.6 C)  TempSrc:   Oral Oral  SpO2: 98%  100%   Weight:  71.3 kg    Height:        Intake/Output Summary (Last 24 hours) at 12/10/2022 1046 Last data filed at 12/10/2022 0336 Gross per 24 hour  Intake 792.5 ml  Output 2050 ml  Net -1257.5 ml   Filed Weights   12/08/22 0328 12/09/22 0437 12/10/22 0500  Weight: 71.2 kg 71.4 kg 71.3 kg    Examination:  General exam: Appears comfortable, deconditioned, not in any acute distress. Respiratory system: CTA bilaterally. Respiratory effort normal. RR 13 Cardiovascular system: S1 & S2 heard, RRR. No JVD, murmurs, rubs, gallops or clicks. Gastrointestinal system: Abdomen is soft, non tender, non distended, bowel sounds present Central nervous system: Alert and oriented x 2. No focal neurological deficits. Extremities: No edema, no cyanosis, no clubbing  Skin: No rashes, lesions or ulcers Psychiatry:  Mood & affect appropriate.     Data Reviewed: I have personally reviewed following labs and imaging studies  CBC: Recent Labs  Lab 12/04/22 0827 12/04/22 0918 12/04/22 1635 12/05/22 0100 12/06/22 0932 12/07/22 0327 12/08/22 0321 12/09/22 0323  WBC 4.4 5.7  --  4.1 4.9 5.1  --  6.5  NEUTROABS 2.9 3.1  --   --  3.9 4.0  --   --   HGB 3.5* 3.5*   < > 7.4* 9.2* 8.2* 7.8* 8.1*  HCT 11.8* 11.7*   < > 23.0* 29.3* 26.4* 24.8* 26.3*  MCV 99.2 96.7  --  90.6 91.8 92.3  --  93.9  PLT 131* 137*  --  115* 106* 116*  --  115*   < > = values in this interval not displayed.   Basic Metabolic Panel: Recent Labs  Lab 12/05/22 0100 12/05/22 0655 12/05/22 2122 12/06/22 0615  12/07/22 0501 12/08/22 0321 12/09/22 0323  NA 132*   < > 136 135 140 142 141  142  K 3.9   < > 4.7 5.0 4.1 4.0 3.6  3.6  CL 98   < > 97* 95* 101 108 106  107  CO2 22   < > 26 28 26 24 22  22   GLUCOSE 214*   < > 135* 202* 146* 185* 168*  169*  BUN 113*   < > 103* 98* 111* 106* 99*  99*  CREATININE 9.52*   < > 8.48* 7.50* 6.78* 5.13* 4.01*  4.09*  CALCIUM 8.2*   < > 7.6* 7.6* 7.5* 7.1* 7.2*  7.1*  MG 3.6*  --   --   --  3.7*  --  2.8*  PHOS  --   --   --   --  5.8*  --  4.0  4.0   < > = values in  this interval not displayed.   GFR: Estimated Creatinine Clearance: 14.9 mL/min (A) (by C-G formula based on SCr of 4.09 mg/dL (H)). Liver Function Tests: Recent Labs  Lab 12/04/22 0515 12/06/22 0615 12/09/22 0323  AST 35 31  --   ALT 28 19  --   ALKPHOS 62 58  --   BILITOT 1.0 0.9  --   PROT >12.0* 9.4*  --   ALBUMIN 2.6* 1.9* 2.0*   No results for input(s): "LIPASE", "AMYLASE" in the last 168 hours. No results for input(s): "AMMONIA" in the last 168 hours. Coagulation Profile: Recent Labs  Lab 12/04/22 0515  INR 1.5*   Cardiac Enzymes: Recent Labs  Lab 12/04/22 0515  CKTOTAL 66   BNP (last 3 results) No results for input(s): "PROBNP" in the last 8760 hours. HbA1C: No results for input(s): "HGBA1C" in the last 72 hours. CBG: Recent Labs  Lab 12/09/22 1555 12/09/22 1932 12/09/22 2317 12/10/22 0336 12/10/22 0758  GLUCAP 199* 194* 116* 165* 120*   Lipid Profile: No results for input(s): "CHOL", "HDL", "LDLCALC", "TRIG", "CHOLHDL", "LDLDIRECT" in the last 72 hours. Thyroid Function Tests: No results for input(s): "TSH", "T4TOTAL", "FREET4", "T3FREE", "THYROIDAB" in the last 72 hours. Anemia Panel: No results for input(s): "VITAMINB12", "FOLATE", "FERRITIN", "TIBC", "IRON", "RETICCTPCT" in the last 72 hours. Sepsis Labs: Recent Labs  Lab 12/04/22 0529 12/04/22 0819 12/04/22 0918 12/05/22 1219 12/06/22 0615 12/07/22 0501 12/08/22 0321  PROCALCITON   --   --    < > 5.01 3.72 2.42 1.23  LATICACIDVEN 6.0* 2.7*  --   --   --   --   --    < > = values in this interval not displayed.    Recent Results (from the past 240 hour(s))  Blood Culture (routine x 2)     Status: Abnormal   Collection Time: 12/04/22  6:16 AM   Specimen: BLOOD  Result Value Ref Range Status   Specimen Description   Final    BLOOD RIGHT ANTECUBITAL Performed at [facility], [address] [place], [place] [address]    Special Requests   Final    BOTTLES DRAWN AEROBIC AND ANAEROBIC Blood Culture adequate volume Performed at [facility], [address] [place], [place] [address]    Culture  Setup Time   Final    GRAM POSITIVE COCCI IN CLUSTERS AEROBIC BOTTLE ONLY CRITICAL RESULT CALLED TO, READ BACK BY AND VERIFIED WITH: PHARMD M. Doran Durand 12/05/22 @ 0604 BY AB    Culture (A)  Final    STAPHYLOCOCCUS CAPITIS THE SIGNIFICANCE OF ISOLATING THIS ORGANISM FROM A SINGLE SET OF BLOOD CULTURES WHEN MULTIPLE SETS ARE DRAWN IS UNCERTAIN. PLEASE NOTIFY THE MICROBIOLOGY DEPARTMENT WITHIN ONE WEEK IF SPECIATION AND SENSITIVITIES ARE REQUIRED. Performed at [facility], [address] [place], [place] [address]    Report Status 12/06/2022 FINAL  Final  Blood Culture ID Panel (Reflexed)     Status: Abnormal   Collection Time: 12/04/22  6:16 AM  Result Value Ref Range Status   Enterococcus faecalis NOT DETECTED NOT DETECTED Final   Enterococcus Faecium NOT DETECTED NOT DETECTED Final   Listeria monocytogenes NOT DETECTED NOT DETECTED Final   Staphylococcus species DETECTED (A) NOT DETECTED Final    Comment: CRITICAL RESULT CALLED TO, READ BACK BY AND VERIFIED WITH: PHARMD M. LILLISTON 12/05/22 @ 0604 BY AB    Staphylococcus aureus (BCID) NOT DETECTED NOT DETECTED Final   Staphylococcus epidermidis NOT DETECTED NOT  DETECTED Final   Staphylococcus lugdunensis NOT DETECTED NOT DETECTED Final   Streptococcus species NOT  DETECTED NOT DETECTED Final   Streptococcus agalactiae NOT DETECTED NOT DETECTED Final   Streptococcus pneumoniae NOT DETECTED NOT DETECTED Final   Streptococcus pyogenes NOT DETECTED NOT DETECTED Final   A.calcoaceticus-baumannii NOT DETECTED NOT DETECTED Final   Bacteroides fragilis NOT DETECTED NOT DETECTED Final   Enterobacterales NOT DETECTED NOT DETECTED Final   Enterobacter cloacae complex NOT DETECTED NOT DETECTED Final   Escherichia coli NOT DETECTED NOT DETECTED Final   Klebsiella aerogenes NOT DETECTED NOT DETECTED Final   Klebsiella oxytoca NOT DETECTED NOT DETECTED Final   Klebsiella pneumoniae NOT DETECTED NOT DETECTED Final   Proteus species NOT DETECTED NOT DETECTED Final   Salmonella species NOT DETECTED NOT DETECTED Final   Serratia marcescens NOT DETECTED NOT DETECTED Final   Haemophilus influenzae NOT DETECTED NOT DETECTED Final   Neisseria meningitidis NOT DETECTED NOT DETECTED Final   Pseudomonas aeruginosa NOT DETECTED NOT DETECTED Final   Stenotrophomonas maltophilia NOT DETECTED NOT DETECTED Final   Candida albicans NOT DETECTED NOT DETECTED Final   Candida auris NOT DETECTED NOT DETECTED Final   Candida glabrata NOT DETECTED NOT DETECTED Final   Candida krusei NOT DETECTED NOT DETECTED Final   Candida parapsilosis NOT DETECTED NOT DETECTED Final   Candida tropicalis NOT DETECTED NOT DETECTED Final   Cryptococcus neoformans/gattii NOT DETECTED NOT DETECTED Final    Comment: Performed at [facility], [address], [place], [place] [address]  Blood Culture (routine x 2)     Status: None   Collection Time: 12/04/22  6:30 AM   Specimen: BLOOD  Result Value Ref Range Status   Specimen Description   Final    BLOOD LEFT ANTECUBITAL Performed at [facility], [address] [place], [place] [address]    Special Requests   Final    BOTTLES DRAWN AEROBIC AND ANAEROBIC Blood Culture adequate volume Performed at [facility], [address], [place], [place] [address]    Culture   Final    NO GROWTH 5 DAYS Performed at [facility], [address], [place], [place] [address]    Report Status 12/09/2022 FINAL  Final  Resp panel by RT-PCR (RSV, Flu A&B, Covid) Anterior Nasal Swab     Status: None   Collection Time: 12/04/22  8:15 AM   Specimen: Anterior Nasal Swab  Result Value Ref Range Status   SARS Coronavirus 2 by RT PCR NEGATIVE NEGATIVE Final    Comment: (NOTE) SARS-CoV-2 target nucleic acids are NOT DETECTED.  The SARS-CoV-2 RNA is generally detectable in upper respiratory specimens during the acute phase of infection. The lowest concentration of SARS-CoV-2 viral copies this assay can detect is 138 copies/mL. A negative result does not preclude SARS-Cov-2 infection and should not be used as the sole basis for treatment or other patient management decisions. A negative result may occur with  improper specimen collection/handling, submission of specimen other than nasopharyngeal swab, presence of viral mutation(s) within the areas targeted by this assay, and inadequate number of viral copies(<138 copies/mL). A negative result must be combined with clinical observations, patient history, and epidemiological information. The expected result is Negative.  Fact Sheet for Patients:  BloggerCourse.com  Fact Sheet for Healthcare Providers:  SeriousBroker.it  This test is no t yet approved or cleared by the [place] FDA and  has been authorized for detection and/or diagnosis of SARS-CoV-2 by  FDA under an Emergency Use Authorization (EUA). This EUA will remain  in effect (meaning this test can be used) for the duration of the COVID-19 declaration under Section 564(b)(1) of the Act, 21 U.S.C.section 360bbb-3(b)(1), unless the authorization is terminated  or revoked sooner.       Influenza A by PCR NEGATIVE NEGATIVE Final    Influenza B by PCR NEGATIVE NEGATIVE Final    Comment: (NOTE) The Xpert Xpress SARS-CoV-2/FLU/RSV plus assay is intended as an aid in the diagnosis of influenza from Nasopharyngeal swab specimens and should not be used as a sole basis for treatment. Nasal washings and aspirates are unacceptable for Xpert Xpress SARS-CoV-2/FLU/RSV testing.  Fact Sheet for Patients: BloggerCourse.com  Fact Sheet for Healthcare Providers: SeriousBroker.it  This test is not yet approved or cleared by the [place] FDA and has been authorized for detection and/or diagnosis of SARS-CoV-2 by FDA under an Emergency Use Authorization (EUA). This EUA will remain in effect (meaning this test can be used) for the duration of the COVID-19 declaration under Section 564(b)(1) of the Act, 21 U.S.C. section 360bbb-3(b)(1), unless the authorization is terminated or revoked.     Resp Syncytial Virus by PCR NEGATIVE NEGATIVE Final    Comment: (NOTE) Fact Sheet for Patients: BloggerCourse.com  Fact Sheet for Healthcare Providers: SeriousBroker.it  This test is not yet approved or cleared by the [place] FDA and has been authorized for detection and/or diagnosis of SARS-CoV-2 by FDA under an Emergency Use Authorization (EUA). This EUA will remain in effect (meaning this test can be used) for the duration of the COVID-19 declaration under Section 564(b)(1) of the Act, 21 U.S.C. section 360bbb-3(b)(1), unless the authorization is terminated or revoked.  Performed at [facility], [address] [place], [place] [address]   MRSA Next Gen by PCR, Nasal     Status: None   Collection Time: 12/04/22 10:57 AM   Specimen: Nasal Mucosa; Nasal Swab  Result Value Ref Range Status   MRSA by PCR Next Gen NOT DETECTED NOT DETECTED Final    Comment: (NOTE) The GeneXpert MRSA Assay (FDA approved for  NASAL specimens only), is one component of a comprehensive MRSA colonization surveillance program. It is not intended to diagnose MRSA infection nor to guide or monitor treatment for MRSA infections. Test performance is not FDA approved in patients less than 40 years old. Performed at [facility], [address] [place], [place] [address]   Culture, blood (Routine X 2) w Reflex to ID Panel     Status: None (Preliminary result)   Collection Time: 12/06/22  6:15 AM   Specimen: BLOOD  Result Value Ref Range Status   Specimen Description   Final    BLOOD BLOOD RIGHT ARM Performed at [facility], [address] [place], [place] [address]    Special Requests   Final    BOTTLES DRAWN AEROBIC ONLY Blood Culture adequate volume Performed at [facility], [address], [place], [place] [address]    Culture   Final    NO GROWTH 4 DAYS Performed at [facility], [address] [place], [place] [address]    Report Status PENDING  Incomplete  Culture, blood (Routine X 2) w Reflex to ID Panel     Status: None (Preliminary result)   Collection Time: 12/06/22  6:30 AM   Specimen: BLOOD  Result Value Ref Range Status   Specimen Description   Final  BLOOD BLOOD LEFT FOREARM Performed at [facility], [address] [place], [place] [address]    Special Requests   Final    BOTTLES DRAWN AEROBIC ONLY Blood Culture adequate volume Performed at [facility], [address] [place], [place] [address]    Culture   Final    NO GROWTH 4 DAYS Performed at [facility], [address] [place], [place] [address]    Report Status PENDING  Incomplete   Radiology Studies: No results found.  Scheduled Meds:  amLODipine  10 mg Oral Daily   brimonidine  1 drop Both Eyes TID   Chlorhexidine Gluconate Cloth  6 each Topical Q2200   ferrous sulfate  325 mg Oral QODAY   insulin aspart   0-15 Units Subcutaneous Q4H   latanoprost  1 drop Both Eyes QHS   levothyroxine  150 mcg Oral Q0600   pantoprazole  40 mg Oral BID   polyethylene glycol  17 g Oral Daily   sennosides  15 mL Oral BID   tamsulosin  0.4 mg Oral Daily   Continuous Infusions:     LOS: 6 days    Time spent: 35 mins    Willeen Niece, MD [facility]   If 7PM-7AM, please contact night-coverage

## 2022-12-10 NOTE — Progress Notes (Signed)
[facility] Progress Note   Subjective:   even after d/c IVF yesterday Cr cont to improve. I/Os yest 1.2 / 2.3. No new issues. Foley was removed yesterday - incontinent per RN notes.   Objective Vitals:   12/10/22 1000 12/10/22 1100 12/10/22 1157 12/10/22 1200  BP: (!) 156/67 133/65  (!) 144/64  Pulse: 94 80  72  Resp: 15 15  13   Temp:   (!) 97.1 F (36.2 C)   TempSrc:   Oral   SpO2: (!) 83% 98%  98%  Weight:      Height:       Physical Exam Gen:  awake and calm in bed ENT: MMM CV:  RRR without rub Abd:  soft, nontender Lungs: clear on RA GU: foley out Extr:  no edema Neuro: awake and calm, no deficits noted; blind Skin: warm and dry  Additional Objective Labs: Basic Metabolic Panel: Recent Labs  Lab 12/07/22 0501 12/08/22 0321 12/09/22 0323 12/10/22 1043  NA 140 142 141  142 142  K 4.1 4.0 3.6  3.6 3.4*  CL 101 108 106  107 108  CO2 26 24 22  22 22   GLUCOSE 146* 185* 168*  169* 206*  BUN 111* 106* 99*  99* 85*  CREATININE 6.78* 5.13* 4.01*  4.09* 3.00*  CALCIUM 7.5* 7.1* 7.2*  7.1* 6.8*  PHOS 5.8*  --  4.0  4.0  --    Liver Function Tests: Recent Labs  Lab 12/04/22 0515 12/06/22 0615 12/09/22 0323  AST 35 31  --   ALT 28 19  --   ALKPHOS 62 58  --   BILITOT 1.0 0.9  --   PROT >12.0* 9.4*  --   ALBUMIN 2.6* 1.9* 2.0*   No results for input(s): "LIPASE", "AMYLASE" in the last 168 hours. CBC: Recent Labs  Lab 12/04/22 0918 12/04/22 1635 12/05/22 0100 12/06/22 0932 12/07/22 0327 12/08/22 0321 12/09/22 0323 12/10/22 1043  WBC 5.7  --  4.1 4.9 5.1  --  6.5 4.3  NEUTROABS 3.1  --   --  3.9 4.0  --   --   --   HGB 3.5*   < > 7.4* 9.2* 8.2* 7.8* 8.1* 8.5*  HCT 11.7*   < > 23.0* 29.3* 26.4* 24.8* 26.3* 28.1*  MCV 96.7  --  90.6 91.8 92.3  --  93.9 95.6  PLT 137*  --  115* 106* 116*  --  115* 105*   < > = values in this interval not displayed.   Blood Culture    Component Value Date/Time   SDES  12/06/2022 0630     BLOOD BLOOD LEFT FOREARM Performed at [facility], [address] [place], [place] [address]    SPECREQUEST  12/06/2022 0630    BOTTLES DRAWN AEROBIC ONLY Blood Culture adequate volume Performed at [facility], [address], [place], [place] [address]    CULT  12/06/2022 0630    NO GROWTH 4 DAYS Performed at [facility], [address] [place], [place] [address]    REPTSTATUS PENDING 12/06/2022 0630    Cardiac Enzymes: Recent Labs  Lab 12/04/22 0515  CKTOTAL 66   CBG: Recent Labs  Lab 12/09/22 1932 12/09/22 2317 12/10/22 0336 12/10/22 0758 12/10/22 1128  GLUCAP 194* 116* 165* 120* 168*   Iron Studies: No results for input(s): "IRON", "TIBC", "TRANSFERRIN", "FERRITIN" in the last 72 hours. @lablastinr3 @ Studies/Results: No results found. Medications:    amLODipine  10 mg Oral Daily   brimonidine  1 drop Both Eyes TID   Chlorhexidine Gluconate Cloth  6 each Topical Q2200   ferrous sulfate  325 mg Oral QODAY   insulin aspart  0-15 Units Subcutaneous Q4H   latanoprost  1 drop Both Eyes QHS   levothyroxine  150 mcg Oral Q0600   pantoprazole  40 mg Oral BID   polyethylene glycol  17 g Oral Daily   sennosides  15 mL Oral BID   tamsulosin  0.4 mg Oral Daily    Assessment/Plan **AKI on CKD: recent baseline Cr ~1.5-1.7mg /dL > 6/21 3.4 > presented with Cr 11.7.   Suspect that the untreated myeloma is a major driving factor here for his AKI but certainly initially very dry.  Imaging shows no obstruction.  We'll pursue maximum medical therapy at this point but would  not consider him a candidate for dialysis in light of his advanced malignancy, comorbids, functional status (wheel chair bound and nearly bed bound recently) and would recommend against it.  He currently has no indications for dialysis at this time anyway.  Have discussed with wife no dialysis.  Having good UOP and consistent improvement in Cr --> cont max medical  care for now.  BUN disproportionately up related to steroids.  Avoid nephrotoxins, maintain euvolemia.  PO intake improved -- doing fine off IVF.  Foley removed 8/3 - incont but doing ok.  Ordered PVR x 48 to ensure no retention.    **Anemia:  Hb 3.5 initially, rec'd 2u pRBC and is stable in the 8s.   **Myeloma:  dx 09/2022 --> saw Dr. Candise Che and PET, chemo ordered; pt frustrated by IV issue at PET in early 10/2022 and left scan (he reports), hasn't started chemo --> onc has been consulted inpatient as has palliative.  Onc following and started decadron, velcade  **HTN: BP remains elevated but improved this AM, cont amlodipine 5. Vol looks ok.    Given continued improvement in Cr and great UOP nothing further to add.  Will plan to see pt in clinic in about 4-6 weeks, please reach out if can further assist.    Estill Bakes MD 12/10/2022, 1:50 PM  [facility] Pager: [phone]

## 2022-12-11 DIAGNOSIS — Z515 Encounter for palliative care: Secondary | ICD-10-CM | POA: Diagnosis not present

## 2022-12-11 DIAGNOSIS — Z7189 Other specified counseling: Secondary | ICD-10-CM | POA: Diagnosis not present

## 2022-12-11 DIAGNOSIS — N170 Acute kidney failure with tubular necrosis: Secondary | ICD-10-CM | POA: Diagnosis not present

## 2022-12-11 DIAGNOSIS — N184 Chronic kidney disease, stage 4 (severe): Secondary | ICD-10-CM | POA: Diagnosis not present

## 2022-12-11 DIAGNOSIS — N179 Acute kidney failure, unspecified: Secondary | ICD-10-CM | POA: Diagnosis not present

## 2022-12-11 DIAGNOSIS — C9000 Multiple myeloma not having achieved remission: Secondary | ICD-10-CM | POA: Diagnosis not present

## 2022-12-11 LAB — GLUCOSE, CAPILLARY
Glucose-Capillary: 104 mg/dL — ABNORMAL HIGH (ref 70–99)
Glucose-Capillary: 141 mg/dL — ABNORMAL HIGH (ref 70–99)
Glucose-Capillary: 225 mg/dL — ABNORMAL HIGH (ref 70–99)
Glucose-Capillary: 133 mg/dL — ABNORMAL HIGH (ref 70–99)
Glucose-Capillary: 96 mg/dL (ref 70–99)
Glucose-Capillary: 121 mg/dL — ABNORMAL HIGH (ref 70–99)

## 2022-12-11 LAB — BASIC METABOLIC PANEL
Anion gap: 12 (ref 5–15)
BUN: 69 mg/dL — ABNORMAL HIGH (ref 8–23)
CO2: 21 mmol/L — ABNORMAL LOW (ref 22–32)
Calcium: 7.1 mg/dL — ABNORMAL LOW (ref 8.9–10.3)
Chloride: 113 mmol/L — ABNORMAL HIGH (ref 98–111)
Creatinine, Ser: 2.59 mg/dL — ABNORMAL HIGH (ref 0.61–1.24)
GFR, Estimated: 26 mL/min — ABNORMAL LOW (ref >60)
Glucose, Bld: 143 mg/dL — ABNORMAL HIGH (ref 70–99)
Potassium: 3.3 mmol/L — ABNORMAL LOW (ref 3.5–5.1)
Sodium: 146 mmol/L — ABNORMAL HIGH (ref 135–145)

## 2022-12-11 LAB — MAGNESIUM: Magnesium: 2 mg/dL (ref 1.7–2.4)

## 2022-12-11 LAB — PHOSPHORUS: Phosphorus: 2.3 mg/dL — ABNORMAL LOW (ref 2.5–4.6)

## 2022-12-11 MED ORDER — FLUCONAZOLE 100 MG PO TABS
100.0000 mg | ORAL_TABLET | Freq: Every day | ORAL | Status: AC
  Administered 2022-12-11 – 2022-12-15 (×4): 100 mg via ORAL
  Filled 2022-12-11 (×6): qty 1

## 2022-12-11 MED ORDER — POTASSIUM PHOSPHATES 15 MMOLE/5ML IV SOLN
30.0000 mmol | Freq: Once | INTRAVENOUS | Status: AC
  Administered 2022-12-11: 30 mmol via INTRAVENOUS
  Filled 2022-12-11: qty 10

## 2022-12-11 MED ORDER — AMLODIPINE BESYLATE 5 MG PO TABS
5.0000 mg | ORAL_TABLET | Freq: Every day | ORAL | Status: DC
  Administered 2022-12-11 – 2022-12-23 (×10): 5 mg via ORAL
  Filled 2022-12-11 (×13): qty 1

## 2022-12-11 MED ORDER — K PHOS MONO-SOD PHOS DI & MONO 155-852-130 MG PO TABS
250.0000 mg | ORAL_TABLET | Freq: Three times a day (TID) | ORAL | Status: DC
  Administered 2022-12-11: 250 mg via ORAL
  Filled 2022-12-11 (×2): qty 1

## 2022-12-11 MED ORDER — DEXAMETHASONE 4 MG PO TABS
20.0000 mg | ORAL_TABLET | Freq: Every day | ORAL | Status: AC
  Administered 2022-12-13 – 2022-12-15 (×3): 20 mg via ORAL
  Filled 2022-12-11 (×2): qty 5
  Filled 2022-12-11: qty 1

## 2022-12-11 NOTE — Progress Notes (Signed)
Daily Progress Note   Patient Name: Scott Bass       Date: 12/11/2022 DOB: 12/07/1950  Age: 72 y.o. MRN#: [id number] Attending Physician: Scott Niece, MD Primary Care Physician: Scott Harries, MD Admit Date: 12/04/2022 Length of Stay: 7 days  Reason for Consultation/Follow-up: Establishing goals of care  Subjective:   CC: Patient laying in bed comfortably this morning.  Patient wondering when he can get out of the bed.  Following up regarding complex medical decision making.   Subjective:  Reviewed EMR prior to presenting to bedside.  Patient's creatinine continues to improve.  Good urine output.  Foley was removed yesterday.  Patient being treated for electrolyte imbalances.  CIR continues to evaluate for potential admission.  Presented to bedside to check on patient and his wife in the afternoon.  Patient laying in bed wondering when he can get out of it.  Discussed reason patient was safely stay in the bed.  Noted would discussed with nurse ambulating as able.  Also encouraged continued work with physical therapy.  Patient's wife wondering when he will be able to go to rehab.  Noted defer to CIR evaluation and needs to be medically stable for discharge.  Patient does not have any symptoms of concern at this time.  Noted this palliative medicine provider will be going off service though colleague Dr. Linna Bass would be present if further palliative medicine needs arise.    Patient will likely need home palliative medicine if appropriate at time of discharge versus follow-up at [facility] with outpatient palliative medicine.  All questions answered at that time.  Thank patient and his wife for allowing me to visit today.  Review of Systems No symptom concerns stated during visit Objective:   Vital Signs:  BP (!) 95/44   Pulse (!) 101   Temp 97.9 F (36.6 C) (Oral)   Resp (!) 22   Ht 5\' 6"  (1.676 m)   Wt 70.5 kg   SpO2 95%   BMI 25.09 kg/m   Physical  Exam: General: NAD,laying in bed, chronically ill appearing  Eyes: No drainage noted HENT: Dry mucous membranes Cardiovascular: RRR Respiratory: no increased work of breathing noted, not in respiratory distress Abdomen: not distended Neuro: calm, awake, interactive though slow to respond at times  Imaging:  I personally reviewed recent imaging.   Assessment & Plan:   Assessment: Patient is a 72 year old male with a past medical history of wheelchair-bound status, legally blind, CKD, GERD, OSA, hypertension, diabetes mellitus, and recent diagnosis of multiple myeloma who was admitted on 12/04/2022 after a fall at home.  Upon admission, patient was found to have AKI on CKD, encephalopathy, and multiple electrolyte imbalances.  Patient seen by oncology and nephrology during hospitalization.  Patient was supposed to follow-up with oncology on 6/12 though missed this appointment and so did not begin therapies for his multiple myeloma.  Palliative medicine team consulted to assist with complex medical decision making   Recommendations/Plan: # Complex medical decision making/goals of care:  - Patient and wife continuing  open conversations with providers. Patient started on chemotherapy as per oncology. Patient being evaluated for potential CIR admission.  -  Code Status: DNR  # Symptom management:  Non-pharmacologic Delirium Precautions  Patient's delirium slowly improving with improvement of medical concerns, particularly improved of kidney function.   - Frequent re-orientation; update board w/ date, names of treatment team  - Daytime stimulation: Open curtains, turn lights on, and turn on television as tolerated during  waking hours  - Nighttime calm - close curtains, turn lights off, and turn off television at 9pm  with minimal interruptions from treatment team during sleeping hours, including avoiding lab draws if possible  - Encourage continued presence of family/friends  - Occupy w/  distractions - e.g. ask them to fold washcloths, busy-board  - Encourage movement with PT/OT as tolerated  - Ensure adequate sensorium, to include providing glasses, dentures, and hearing aids to patient as appropriate  - Ensure adequate nutrition and fluid intake  - Assess and treat pain (incl nonverbal cues); e.g. consider scheduled tylenol  - Assess and treat constipation and urinary retention  - Ensure hydration, electrolyte balance (K to 4.0, Mg to 2.0), adequate oxygenation  - Review medications (addition, deletion, changes can trigger)   # Psychosocial Support:  -Wife (who is also blind)  # Discharge Planning: Likely rehab- SNF vs CIR  -Likely will need home palliative medicine referral vs [facility] PMT follow up at time of discharge.   Discussed with: patient, patient's wife, RN  Thank you for allowing the palliative care team to participate in the care Scott Bass.  Scott Morin, DO Palliative Care Provider PMT # [phone]  If patient remains symptomatic despite maximum doses, please call PMT at [phone] between 0700 and 1900. Outside of these hours, please call attending, as PMT does not have night coverage.  *Please note that this is a verbal dictation therefore any spelling or grammatical errors are due to the "[employer]" system interpretation.

## 2022-12-11 NOTE — Progress Notes (Signed)
PROGRESS NOTE    Scott Bass  WUJ:[id number] DOB: 02-15-1951 DOA: 12/04/2022 PCP: Tracey Harries, MD   Brief Narrative:  This 72 years old wheelchair-bound male with PMH significant for blindness, recently diagnosed multiple myeloma in May 2024 but has not started therapy yet, hypertension, diabetes presented s/p fall and found to be confused, tachycardic and constipated with labs significant for AKI with hyperkalemia and severe metabolic acidosis.  He was also found to have hemoglobin 3.5, lactic acid 6.0.  Patient was initially admitted in the ICU and started on broad-spectrum antibiotics and fluid resuscitation.  Patient has received 3 units of packed red blood cells hemoglobin 7.5.  TRH pickup 12/06/2022.  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  Not a candidate for hemodialysis given advanced malignancy, comorbidity and functional status. Serum creatinine improving 11.76 > 11.06 >10.40 > 9.52> 9.10 >8.48 > 7.50 >6.78 > 5.13 >4.01>3.0>2.59 IV fluids discontinued, serum creatinine continue to improve. Good urine output, Foley was removed yesterday.  Hyperkalemia: Resolved with calcium gluconate, Lasix, insulin and D50. Now hypokalemia > being replaced. Continue to monitor  Hyponatremia: resolved with IV hydration. Now hypernatremia. IVF discontinued, Continue to monitor.  Metabolic acidosis: Resolved with sodium bicarbonate infusion.  Lactic acidosis > Improving. IVF discontinued.   Lactic acid improving 6.0 > 2.7  Acute blood loss anemia likely secondary to multiple myeloma: S/p 2 unit PRBC.  Hemoglobin 7.4 >8.2 > 8.5 Keep hemoglobin above 7.  Tachycardia: Likely multifactorial due to anemia, dehydration, suspected sepsis. One of the 4 blood culture showed Staphylococcus Capitis.  Likely contaminant  Multiple myeloma with diffuse pathological fractures Mild thrombocytopenia: Oncology is consulted recommended Decadron and Velcade. Palliative consulted to discuss goals of care.  Large mass involving T4 vertebral body with soft tissue component: MRI with and without C and T-spine when renal functions improves.  Acute metabolic encephalopathy Likely secondary to multiple myeloma, uremia and others. Minimize sedation,  reorient as above. Continue Haldol as needed for agitation and restlessness. Psychiatry consulted for agitation, restlessness.   Constipation: Aggressive bowel regimen.  Hypothyroidism: Continue Synthroid.  DVT prophylaxis: SCDs Code Status: DNR Family Communication: Wife at bed side. Disposition Plan:    Status is: Inpatient Remains inpatient appropriate because: Admitted for AKI on CKD stage IV in the setting of multiple myeloma,  not been treated. AKI is improving,  not a candidate for hemodialysis.   Consultants:  PCCM Nephrology  Procedures:None  Antimicrobials:  Anti-infectives (From admission, onward)    Start     Dose/Rate Route Frequency Ordered Stop   12/11/22 1000  fluconazole (DIFLUCAN) tablet 100 mg        100 mg Oral Daily 12/11/22 0844 12/16/22 0959   12/05/22 1000  cefTRIAXone (ROCEPHIN) 2 g in sodium chloride 0.9 % 100 mL IVPB        2 g 200 mL/hr over 30 Minutes Intravenous Every 24 hours 12/04/22 1340 12/08/22 1008   12/04/22 0600  ceFEPIme (MAXIPIME) 2 g in sodium chloride 0.9 %  100 mL IVPB        2 g 200 mL/hr over 30 Minutes Intravenous  Once 12/04/22 0557 12/04/22 0726   12/04/22 0600  metroNIDAZOLE  (FLAGYL) IVPB 500 mg        500 mg 100 mL/hr over 60 Minutes Intravenous  Once 12/04/22 0557 12/04/22 0829   12/04/22 0600  vancomycin (VANCOCIN) IVPB 1000 mg/200 mL premix        1,000 mg 200 mL/hr over 60 Minutes Intravenous  Once 12/04/22 0557 12/04/22 1308      Subjective: Patient was seen and examined at bedside. Overnight events noted.  Patient seems much improved.  He is alert and oriented, following commands. He seems back to his baseline mental status.   Objective: Vitals:   12/11/22 0500 12/11/22 0600 12/11/22 0700 12/11/22 0811  BP: (!) 140/56 (!) 95/44    Pulse: 93 86 (!) 101   Resp: (!) 24 15 (!) 22   Temp:    99 F (37.2 C)  TempSrc:    Oral  SpO2: 92% 92% 95%   Weight: 70.5 kg     Height:        Intake/Output Summary (Last 24 hours) at 12/11/2022 1010 Last data filed at 12/11/2022 0500 Gross per 24 hour  Intake --  Output 1900 ml  Net -1900 ml   Filed Weights   12/09/22 0437 12/10/22 0500 12/11/22 0500  Weight: 71.4 kg 71.3 kg 70.5 kg    Examination:  General exam: Appears comfortable, deconditioned, not in any acute distress. Respiratory system: CTA bilaterally. Respiratory effort normal. RR 13 Cardiovascular system: S1 & S2 heard, RRR. No JVD, murmurs, rubs, gallops or clicks. Gastrointestinal system: Abdomen is soft, non tender, non distended, bowel sounds present Central nervous system: Alert and oriented x 2. No focal neurological deficits. Extremities: No edema, no cyanosis, no clubbing  Skin: No rashes, lesions or ulcers Psychiatry:  Mood & affect appropriate.     Data Reviewed: I have personally reviewed following labs and imaging studies  CBC: Recent Labs  Lab 12/05/22 0100 12/06/22 0932 12/07/22 0327 12/08/22 0321 12/09/22 0323 12/10/22 1043  WBC 4.1 4.9 5.1  --  6.5 4.3  NEUTROABS  --  3.9 4.0  --   --   --   HGB 7.4* 9.2* 8.2* 7.8* 8.1* 8.5*  HCT 23.0* 29.3* 26.4* 24.8* 26.3* 28.1*  MCV 90.6 91.8 92.3  --  93.9 95.6  PLT  115* 106* 116*  --  115* 105*   Basic Metabolic Panel: Recent Labs  Lab 12/05/22 0100 12/05/22 0655 12/07/22 0501 12/08/22 0321 12/09/22 0323 12/10/22 1043 12/11/22 0614  NA 132*   < > 140 142 141  142 142 146*  K 3.9   < > 4.1 4.0 3.6  3.6 3.4* 3.3*  CL 98   < > 101 108 106  107 108 113*  CO2 22   < > 26 24 22  22 22  21*  GLUCOSE 214*   < > 146* 185* 168*  169* 206* 143*  BUN 113*   < > 111* 106* 99*  99* 85* 69*  CREATININE 9.52*   < > 6.78* 5.13* 4.01*  4.09* 3.00* 2.59*  CALCIUM 8.2*   < > 7.5* 7.1* 7.2*  7.1* 6.8* 7.1*  MG 3.6*  --  3.7*  --  2.8*  --  2.0  PHOS  --   --  5.8*  --  4.0  4.0  --  2.3*   < > =  values in this interval not displayed.   GFR: Estimated Creatinine Clearance: 23.6 mL/min (A) (by C-G formula based on SCr of 2.59 mg/dL (H)). Liver Function Tests: Recent Labs  Lab 12/06/22 0615 12/09/22 0323  AST 31  --   ALT 19  --   ALKPHOS 58  --   BILITOT 0.9  --   PROT 9.4*  --   ALBUMIN 1.9* 2.0*   No results for input(s): "LIPASE", "AMYLASE" in the last 168 hours. No results for input(s): "AMMONIA" in the last 168 hours. Coagulation Profile: No results for input(s): "INR", "PROTIME" in the last 168 hours.  Cardiac Enzymes: No results for input(s): "CKTOTAL", "CKMB", "CKMBINDEX", "TROPONINI" in the last 168 hours.  BNP (last 3 results) No results for input(s): "PROBNP" in the last 8760 hours. HbA1C: No results for input(s): "HGBA1C" in the last 72 hours. CBG: Recent Labs  Lab 12/10/22 1552 12/10/22 1927 12/10/22 2317 12/11/22 0309 12/11/22 0754  GLUCAP 134* 97 141* 104* 141*   Lipid Profile: No results for input(s): "CHOL", "HDL", "LDLCALC", "TRIG", "CHOLHDL", "LDLDIRECT" in the last 72 hours. Thyroid Function Tests: No results for input(s): "TSH", "T4TOTAL", "FREET4", "T3FREE", "THYROIDAB" in the last 72 hours. Anemia Panel: No results for input(s): "VITAMINB12", "FOLATE", "FERRITIN", "TIBC", "IRON", "RETICCTPCT" in the last  72 hours. Sepsis Labs: Recent Labs  Lab 12/05/22 1219 12/06/22 0615 12/07/22 0501 12/08/22 0321  PROCALCITON 5.01 3.72 2.42 1.23    Recent Results (from the past 240 hour(s))  Blood Culture (routine x 2)     Status: Abnormal   Collection Time: 12/04/22  6:16 AM   Specimen: BLOOD  Result Value Ref Range Status   Specimen Description   Final    BLOOD RIGHT ANTECUBITAL Performed at [facility], [address], [place], [place] [address]    Special Requests   Final    BOTTLES DRAWN AEROBIC AND ANAEROBIC Blood Culture adequate volume Performed at [facility], [address], [place], [place] [address]    Culture  Setup Time   Final    GRAM POSITIVE COCCI IN CLUSTERS AEROBIC BOTTLE ONLY CRITICAL RESULT CALLED TO, READ BACK BY AND VERIFIED WITH: PHARMD M. Doran Durand 12/05/22 @ 0604 BY AB    Culture (A)  Final    STAPHYLOCOCCUS CAPITIS THE SIGNIFICANCE OF ISOLATING THIS ORGANISM FROM A SINGLE SET OF BLOOD CULTURES WHEN MULTIPLE SETS ARE DRAWN IS UNCERTAIN. PLEASE NOTIFY THE MICROBIOLOGY DEPARTMENT WITHIN ONE WEEK IF SPECIATION AND SENSITIVITIES ARE REQUIRED. Performed at [facility], [address] [place], [place] [address]    Report Status 12/06/2022 FINAL  Final  Blood Culture ID Panel (Reflexed)     Status: Abnormal   Collection Time: 12/04/22  6:16 AM  Result Value Ref Range Status   Enterococcus faecalis NOT DETECTED NOT DETECTED Final   Enterococcus Faecium NOT DETECTED NOT DETECTED Final   Listeria monocytogenes NOT DETECTED NOT DETECTED Final   Staphylococcus species DETECTED (A) NOT DETECTED Final    Comment: CRITICAL RESULT CALLED TO, READ BACK BY AND VERIFIED WITH: PHARMD M. LILLISTON 12/05/22 @ 0604 BY AB    Staphylococcus aureus (BCID) NOT DETECTED NOT DETECTED Final   Staphylococcus epidermidis NOT DETECTED NOT DETECTED Final   Staphylococcus lugdunensis NOT DETECTED NOT DETECTED Final   Streptococcus species NOT  DETECTED NOT DETECTED Final   Streptococcus agalactiae NOT DETECTED NOT DETECTED Final   Streptococcus pneumoniae NOT DETECTED NOT DETECTED Final   Streptococcus pyogenes NOT DETECTED NOT DETECTED Final  A.calcoaceticus-baumannii NOT DETECTED NOT DETECTED Final   Bacteroides fragilis NOT DETECTED NOT DETECTED Final   Enterobacterales NOT DETECTED NOT DETECTED Final   Enterobacter cloacae complex NOT DETECTED NOT DETECTED Final   Escherichia coli NOT DETECTED NOT DETECTED Final   Klebsiella aerogenes NOT DETECTED NOT DETECTED Final   Klebsiella oxytoca NOT DETECTED NOT DETECTED Final   Klebsiella pneumoniae NOT DETECTED NOT DETECTED Final   Proteus species NOT DETECTED NOT DETECTED Final   Salmonella species NOT DETECTED NOT DETECTED Final   Serratia marcescens NOT DETECTED NOT DETECTED Final   Haemophilus influenzae NOT DETECTED NOT DETECTED Final   Neisseria meningitidis NOT DETECTED NOT DETECTED Final   Pseudomonas aeruginosa NOT DETECTED NOT DETECTED Final   Stenotrophomonas maltophilia NOT DETECTED NOT DETECTED Final   Candida albicans NOT DETECTED NOT DETECTED Final   Candida auris NOT DETECTED NOT DETECTED Final   Candida glabrata NOT DETECTED NOT DETECTED Final   Candida krusei NOT DETECTED NOT DETECTED Final   Candida parapsilosis NOT DETECTED NOT DETECTED Final   Candida tropicalis NOT DETECTED NOT DETECTED Final   Cryptococcus neoformans/gattii NOT DETECTED NOT DETECTED Final    Comment: Performed at [facility], [address] [place], [place] [address]  Blood Culture (routine x 2)     Status: None   Collection Time: 12/04/22  6:30 AM   Specimen: BLOOD  Result Value Ref Range Status   Specimen Description   Final    BLOOD LEFT ANTECUBITAL Performed at [facility], [address], [place], [place] [address]    Special Requests   Final    BOTTLES DRAWN AEROBIC AND ANAEROBIC Blood Culture adequate volume Performed at [facility], [address], [place], [place] [address]    Culture   Final    NO GROWTH 5 DAYS Performed at [facility], [address] [place], [place] [address]    Report Status 12/09/2022 FINAL  Final  Resp panel by RT-PCR (RSV, Flu A&B, Covid) Anterior Nasal Swab     Status: None   Collection Time: 12/04/22  8:15 AM   Specimen: Anterior Nasal Swab  Result Value Ref Range Status   SARS Coronavirus 2 by RT PCR NEGATIVE NEGATIVE Final    Comment: (NOTE) SARS-CoV-2 target nucleic acids are NOT DETECTED.  The SARS-CoV-2 RNA is generally detectable in upper respiratory specimens during the acute phase of infection. The lowest concentration of SARS-CoV-2 viral copies this assay can detect is 138 copies/mL. A negative result does not preclude SARS-Cov-2 infection and should not be used as the sole basis for treatment or other patient management decisions. A negative result may occur with  improper specimen collection/handling, submission of specimen other than nasopharyngeal swab, presence of viral mutation(s) within the areas targeted by this assay, and inadequate number of viral copies(<138 copies/mL). A negative result must be combined with clinical observations, patient history, and epidemiological information. The expected result is Negative.  Fact Sheet for Patients:  BloggerCourse.com  Fact Sheet for Healthcare Providers:  SeriousBroker.it  This test is no t yet approved or cleared by the [place] FDA and  has been authorized for detection and/or diagnosis of SARS-CoV-2 by FDA under an Emergency Use Authorization (EUA). This EUA will remain  in effect (meaning this test can be used) for the duration of the COVID-19 declaration under Section 564(b)(1) of the Act, 21 U.S.C.section 360bbb-3(b)(1), unless the authorization is terminated  or revoked sooner.  Influenza A by PCR NEGATIVE NEGATIVE Final    Influenza B by PCR NEGATIVE NEGATIVE Final    Comment: (NOTE) The Xpert Xpress SARS-CoV-2/FLU/RSV plus assay is intended as an aid in the diagnosis of influenza from Nasopharyngeal swab specimens and should not be used as a sole basis for treatment. Nasal washings and aspirates are unacceptable for Xpert Xpress SARS-CoV-2/FLU/RSV testing.  Fact Sheet for Patients: BloggerCourse.com  Fact Sheet for Healthcare Providers: SeriousBroker.it  This test is not yet approved or cleared by the [place] FDA and has been authorized for detection and/or diagnosis of SARS-CoV-2 by FDA under an Emergency Use Authorization (EUA). This EUA will remain in effect (meaning this test can be used) for the duration of the COVID-19 declaration under Section 564(b)(1) of the Act, 21 U.S.C. section 360bbb-3(b)(1), unless the authorization is terminated or revoked.     Resp Syncytial Virus by PCR NEGATIVE NEGATIVE Final    Comment: (NOTE) Fact Sheet for Patients: BloggerCourse.com  Fact Sheet for Healthcare Providers: SeriousBroker.it  This test is not yet approved or cleared by the [place] FDA and has been authorized for detection and/or diagnosis of SARS-CoV-2 by FDA under an Emergency Use Authorization (EUA). This EUA will remain in effect (meaning this test can be used) for the duration of the COVID-19 declaration under Section 564(b)(1) of the Act, 21 U.S.C. section 360bbb-3(b)(1), unless the authorization is terminated or revoked.  Performed at [facility], [address], [place], [place] [address]   MRSA Next Gen by PCR, Nasal     Status: None   Collection Time: 12/04/22 10:57 AM   Specimen: Nasal Mucosa; Nasal Swab  Result Value Ref Range Status   MRSA by PCR Next Gen NOT DETECTED NOT DETECTED Final    Comment: (NOTE) The GeneXpert MRSA Assay (FDA approved for  NASAL specimens only), is one component of a comprehensive MRSA colonization surveillance program. It is not intended to diagnose MRSA infection nor to guide or monitor treatment for MRSA infections. Test performance is not FDA approved in patients less than 87 years old. Performed at [facility], [address], [place], [place] [address]   Culture, blood (Routine X 2) w Reflex to ID Panel     Status: None   Collection Time: 12/06/22  6:15 AM   Specimen: BLOOD  Result Value Ref Range Status   Specimen Description   Final    BLOOD BLOOD RIGHT ARM Performed at [facility], [address] [place], [place] [address]    Special Requests   Final    BOTTLES DRAWN AEROBIC ONLY Blood Culture adequate volume Performed at [facility], [address], [place], [place] [address]    Culture   Final    NO GROWTH 5 DAYS Performed at [facility], [address] [place], [place] [address]    Report Status 12/11/2022 FINAL  Final  Culture, blood (Routine X 2) w Reflex to ID Panel     Status: None   Collection Time: 12/06/22  6:30 AM   Specimen: BLOOD  Result Value Ref Range Status   Specimen Description   Final    BLOOD BLOOD LEFT FOREARM Performed at [facility], [address], [place], [place] [address]    Special Requests   Final    BOTTLES DRAWN AEROBIC ONLY Blood Culture adequate volume Performed at [facility], [address] [place], [place] [address]    Culture  Final    NO GROWTH 5 DAYS Performed at [facility], [address] [place], [place] [address]    Report Status 12/11/2022 FINAL  Final   Radiology Studies: No results found.  Scheduled Meds:  amLODipine  5 mg Oral Daily   brimonidine  1 drop Both Eyes TID   Chlorhexidine Gluconate Cloth  6 each Topical Q2200   [START ON 12/12/2022] dexamethasone  20 mg Oral Daily   ferrous sulfate  325 mg Oral QODAY    fluconazole  100 mg Oral Daily   insulin aspart  0-15 Units Subcutaneous Q4H   latanoprost  1 drop Both Eyes QHS   levothyroxine  150 mcg Oral Q0600   pantoprazole  40 mg Oral BID   polyethylene glycol  17 g Oral Daily   sennosides  15 mL Oral BID   tamsulosin  0.4 mg Oral Daily   Continuous Infusions:  potassium PHOSPHATE IVPB (in mmol)        LOS: 7 days    Time spent: 35 mins    Willeen Niece, MD [facility]   If 7PM-7AM, please contact night-coverage

## 2022-12-11 NOTE — Progress Notes (Signed)
Speech Language Pathology Treatment: Dysphagia  Patient Details Name: Scott Bass MRN: [id number] DOB: 1950/05/19 Today's Date: 12/11/2022 Time: 0940-1000 SLP Time Calculation (min) (ACUTE ONLY): 20 min  Assessment / Plan / Recommendation Clinical Impression  Patient seen by SLP for skilled treatment focused on dysphagia goals. He was awake, alert and wanting some water. SLP prepared his 10cc Provale cup and handed it to patient. He took several sips and exhibited only a mild cough response after last sip. His RN then arrived with medications to give patient. RN reported that patient has not been able to swallow capsules or larger pills but he does ok with small or crushed pills in applesauce. Patient required encouragement to try to swallow whole pills and did do better when RN put pill in larger bite of applesauce. SLP also gave patient a cup of nectar thick soda, which seemed to help clear oral residuals. Patient said, "I don't think I can do this", referring to taking his medications. He seems to be somewhat self-limiting but is able to participate with frequent cues and encouragement. SLP will continue to follow.    HPI HPI: Briefly, 72 year old wheel chair bound male with blindness, recently diagnosed multiple myeloma in May 2024 but not has started therapy, HTN, DM who presented for fall and found confused, tachycardic and constipated with labs significant for AKI with hyperkalemia and severe metabolic acidosis and Hg 3.5. LA 6.0. PCCM consulted for admission. In the ED started on broad spectrum abx and fluid resuscitation; CT chest indicated No acute findings within the chest, abdomen or pelvis. No  evidence of solid organ injury. No hemorrhage or edema within the  mediastinum. No pleural effusion or pneumothorax ; ST consulted for swallow evaluation d/t dysphagia noted with liquids per nursing report.      SLP Plan  Continue with current plan of care      Recommendations for follow up  therapy are one component of a multi-disciplinary discharge planning process, led by the attending physician.  Recommendations may be updated based on patient status, additional functional criteria and insurance authorization.    Recommendations  Diet recommendations: Dysphagia 2 (fine chop);Nectar-thick liquid Liquids provided via: Cup;Straw Medication Administration: Crushed with puree Supervision: Full supervision/cueing for compensatory strategies Compensations: Slow rate;Small sips/bites Postural Changes and/or Swallow Maneuvers: Seated upright 90 degrees;Upright 30-60 min after meal                  Oral care BID;Staff/trained caregiver to provide oral care   Frequent or constant Supervision/Assistance Dysphagia, oropharyngeal phase (R13.12)     Continue with current plan of care    Angela Nevin, MA, CCC-SLP Speech Therapy

## 2022-12-11 NOTE — Progress Notes (Signed)
Occupational Therapy Treatment Patient Details Name: Scott Bass MRN: [id number] DOB: 1950/12/20 Today's Date: 12/11/2022   History of present illness 72 year old male presented for fall and found confused, tachycardic and constipated with labs significant for AKI with hyperkalemia and severe metabolic acidosis and Hg 3.5. LA 6.0;  CTs 12/04/2022-innumerable lytic lesions, large expansile lesion in the anterior right second rib, multiple pathologic rib fractures, expansile T4 (T3 on CT cervical spine) mass with soft tissue component extending into the central canal and left neural foramen, C7 transverse process fracture, displaced fracture of the lower RIGHT scapula, of uncertain age. PHMx: blindness (can see some, but not details), recently diagnosed multiple myeloma in May 2024 but has not started therapy (diffuse lesions) , HTN, DM   OT comments  Patient with fair progress toward patient focused goals.  Continues to c/o dizziness, but patient not as shaky this session.  May be able to begin some mobility with a chair follow.  Patient continues to function below his prior level of function, and needs to be Mod I to return home.  OT will continue efforts in the acute setting to address deficits, and Patient will benefit from intensive inpatient follow up therapy, >3 hours/day    Recommendations for follow up therapy are one component of a multi-disciplinary discharge planning process, led by the attending physician.  Recommendations may be updated based on patient status, additional functional criteria and insurance authorization.    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); wife totally blind Restrictions Weight Bearing Restrictions: No       Mobility Bed Mobility Overal bed mobility: Needs Assistance Bed Mobility: Supine to Sit, Sit to Supine     Supine to sit: Min assist Sit to supine: Min assist        Transfers Overall transfer level: Needs assistance Equipment used: Rolling walker (2 wheels), None Transfers: Sit to/from Stand, Bed to chair/wheelchair/BSC Sit to Stand: Min assist Stand pivot transfers: Min assist               Balance Overall balance assessment: Needs assistance Sitting-balance support: No upper extremity supported, Feet supported Sitting balance-Leahy Scale: Good     Standing balance support: Reliant on assistive device for balance Standing balance-Leahy Scale: Poor                             ADL either performed or assessed with clinical judgement   ADL   Eating/Feeding: Set up;Sitting   Grooming: Supervision/safety;Set up;Sitting           Upper Body Dressing : Set up;Supervision/safety   Lower Body Dressing: Minimal assistance;Sit to/from stand   Toilet Transfer: Min guard;Minimal assistance;BSC/3in1;Stand-pivot                  Extremity/Trunk Assessment Upper Extremity Assessment Upper Extremity Assessment: Generalized weakness   Lower Extremity Assessment Lower Extremity Assessment: Defer to PT evaluation   Cervical / Trunk Assessment Cervical / Trunk Assessment: Kyphotic    Vision Baseline Vision/History: 2 Legally blind Patient Visual Report: No change from  baseline     Perception Perception Perception: Within Functional Limits   Praxis Praxis Praxis: Intact    Cognition Arousal/Alertness: Awake/alert Behavior During Therapy: Flat affect Overall Cognitive Status: Within Functional Limits for tasks  assessed                                                             Pertinent Vitals/ Pain       Pain Assessment Pain Assessment: No/denies pain                                                          Frequency  Min 1X/week        Progress Toward Goals  OT Goals(current goals can now be found in the care plan section)  Progress towards OT goals: Progressing toward goals     Plan Discharge plan remains appropriate    Co-evaluation                 AM-PAC OT "6 Clicks" Daily Activity     Outcome Measure   Help from another person eating meals?: A Little Help from another person taking care of personal grooming?: A Little Help from another person toileting, which includes using toliet, bedpan, or urinal?: A Lot Help from another person bathing (including washing, rinsing, drying)?: A Little Help from another person to put on and taking off regular upper body clothing?: A Little Help from another person to put on and taking off regular lower body clothing?: A Little 6 Click Score: 17    End of Session Equipment Utilized During Treatment: Gait belt;Rolling walker (2 wheels)  OT Visit Diagnosis: Unsteadiness on feet (R26.81);Other abnormalities of gait and mobility (R26.89);Muscle weakness (generalized) (M62.81);Other symptoms and signs involving cognitive function;Dizziness and giddiness (R42)   Activity Tolerance     Patient Left in bed;with call bell/phone within reach;with bed alarm set;with family/visitor present   Nurse Communication          Time: 4098-1191 OT Time Calculation (min): 20 min  Charges: OT General Charges $OT Visit: 1 Visit OT Treatments $Self Care/Home Management : 8-22 mins  12/11/2022  RP, OTR/L  Acute Rehabilitation Services  Office:  [phone]   Suzanna Obey 12/11/2022, 1:45 PM

## 2022-12-11 NOTE — Progress Notes (Signed)
IP PROGRESS NOTE  Subjective:   [name] denies pain.  He is uncomfortable in the bed.  He is asking for cold water.  Objective: Vital signs in last 24 hours: Blood pressure (!) 95/44, pulse 86, temperature 97.9 F (36.6 C), temperature source Oral, resp. rate 15, height 5\' 6"  (1.676 m), weight 155 lb 6.8 oz (70.5 kg), SpO2 92%.  Intake/Output from previous day: 08/04 0701 - 08/05 0700 In: -  Out: 2500 [Urine:2500]  Physical Exam: HEENT: Thrush at the palate, tongue, and buccal mucosa Lungs: Clear anteriorly Cardiac: Regular rate and rhythm Abdomen: Soft, no hepatosplenomegaly Vascular: No leg edema Neurologic: Alert, moves all extremities to command    Lab Results: Recent Labs    12/09/22 0323 12/10/22 1043  WBC 6.5 4.3  HGB 8.1* 8.5*  HCT 26.3* 28.1*  PLT 115* 105*    BMET Recent Labs    12/09/22 0323 12/10/22 1043  NA 141  142 142  K 3.6  3.6 3.4*  CL 106  107 108  CO2 22  22 22   GLUCOSE 168*  169* 206*  BUN 99*  99* 85*  CREATININE 4.01*  4.09* 3.00*  CALCIUM 7.2*  7.1* 6.8*     Medications: I have reviewed the patient's current medications.  Assessment/Plan: Multiple myeloma, IgG kappa, diagnosed May 2024-untreated CTs 12/04/2022-innumerable lytic lesions, large expansile lesion in the anterior right second rib, multiple pathologic rib fractures, expansile T4 (T3 on CT cervical spine) mass with soft tissue component extending into the central canal and left neural foramen, C7 transverse process fracture Decadron daily x 4 starting 12/05/2022 Cycle 1 day 1 Velcade 12/07/2022 2.  Acute/chronic renal failure 3.  Severe anemia 4.  Mild thrombocytopenia 5.  Diabetes 6.  Hypertension 7.  Glaucoma 8.  OSA  Scott Bass appears stable.  The renal function continues to improve.  He will begin another course of pulsed Decadron tomorrow.  He will resume weekly Velcade on 12/14/2022.  Dr. Candise Che will return on 12/14/2022.  He can decide on adding  cyclophosphamide or lenalidomide. Recommendations:  Resume pulsed Decadron on 12/12/2022, Velcade 12/14/2022 Management of renal failure per nephrology Fluconazole for oral candidiasis Out of bed, physical therapy      LOS: 7 days   Thornton Papas, MD   12/11/2022, 7:19 AM

## 2022-12-11 NOTE — Progress Notes (Signed)
Inpatient Rehab Admissions Coordinator:    Await insurance for CIR. Will follow for potential admit pending insurance auth.   Megan Salon, MS, CCC-SLP Rehab Admissions Coordinator  [phone] (celll) [phone] (office)

## 2022-12-12 DIAGNOSIS — N170 Acute kidney failure with tubular necrosis: Secondary | ICD-10-CM | POA: Diagnosis not present

## 2022-12-12 DIAGNOSIS — R410 Disorientation, unspecified: Secondary | ICD-10-CM | POA: Diagnosis not present

## 2022-12-12 DIAGNOSIS — N184 Chronic kidney disease, stage 4 (severe): Secondary | ICD-10-CM | POA: Diagnosis not present

## 2022-12-12 LAB — GLUCOSE, CAPILLARY
Glucose-Capillary: 120 mg/dL — ABNORMAL HIGH (ref 70–99)
Glucose-Capillary: 140 mg/dL — ABNORMAL HIGH (ref 70–99)
Glucose-Capillary: 108 mg/dL — ABNORMAL HIGH (ref 70–99)
Glucose-Capillary: 117 mg/dL — ABNORMAL HIGH (ref 70–99)
Glucose-Capillary: 157 mg/dL — ABNORMAL HIGH (ref 70–99)

## 2022-12-12 MED ORDER — HALOPERIDOL LACTATE 5 MG/ML IJ SOLN
2.0000 mg | Freq: Once | INTRAMUSCULAR | Status: AC
  Administered 2022-12-12: 2 mg via INTRAVENOUS

## 2022-12-12 MED ORDER — POTASSIUM CHLORIDE 20 MEQ PO PACK
40.0000 meq | PACK | Freq: Once | ORAL | Status: AC
  Administered 2022-12-12: 40 meq via ORAL
  Filled 2022-12-12: qty 2

## 2022-12-12 NOTE — Progress Notes (Addendum)
Inpatient Rehab Admissions Coordinator:    I continue to await insurance approval for potential CIR admit. I also clarified with our staff on CIR, that if Pt. Continues to receive chemotherapy infusions while on CIR, he will need to be transferred back to [facility] to complete them.  Megan Salon, MS, CCC-SLP Rehab Admissions Coordinator  [phone] (celll) [phone] (office)

## 2022-12-12 NOTE — Progress Notes (Signed)
Patient agitated, attempting to get out of the bed. After giving haldol IV earlier, noted that the IV was leaking around the site while flushing. IV removed and new IV placed. Unsure if patient recieved full dose of haldol at that time, partial dose given for agitation per NP.

## 2022-12-12 NOTE — Plan of Care (Signed)
  Problem: Clinical Measurements: Goal: Ability to maintain clinical measurements within normal limits will improve Outcome: Progressing Goal: Will remain free from infection Outcome: Progressing Goal: Diagnostic test results will improve Outcome: Progressing   Problem: Education: Goal: Knowledge of General Education information will improve Description: Including pain rating scale, medication(s)/side effects and non-pharmacologic comfort measures Outcome: Not Progressing   Problem: Health Behavior/Discharge Planning: Goal: Ability to manage health-related needs will improve Outcome: Not Progressing   Problem: Nutrition: Goal: Adequate nutrition will be maintained Outcome: Not Progressing   Problem: Coping: Goal: Level of anxiety will decrease Outcome: Not Progressing   Problem: Health Behavior/Discharge Planning: Goal: Ability to manage health-related needs will improve Outcome: Not Progressing   Problem: Nutritional: Goal: Maintenance of adequate nutrition will improve Outcome: Not Progressing

## 2022-12-12 NOTE — Progress Notes (Signed)
PROGRESS NOTE    ANCIL SCHEEL  VZD:[id number] DOB: 23-Oct-1950 DOA: 12/04/2022 PCP: Tracey Harries, MD   Brief Narrative:  This 72 years old wheelchair-bound male with PMH significant for blindness, recently diagnosed multiple myeloma in May 2024 but has not started therapy yet, hypertension, diabetes mellitus presented s/p fall and found to be confused, tachycardic and constipated with labs significant for AKI with hyperkalemia and severe metabolic acidosis.  He was also found to have hemoglobin 3.5, lactic acid 6.0.  Patient was initially admitted in the ICU and started on broad-spectrum antibiotics and fluid resuscitation.  Patient has received 3 units of packed red blood cells.  hemoglobin 7.5.  TRH pickup 12/06/2022.  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  Not a candidate for hemodialysis given advanced malignancy, comorbidity and functional status. Serum creatinine improving 11.76 > 11.06 >10.40 > 9.52 > 9.10 >8.48 > 7.50 >6.78 > 5.13 >4.01>3.0 >2.59 >2.34 IV fluids discontinued, serum creatinine continues to improve. Good urine output, Foley was removed 8/5.  Hyperkalemia: Resolved with calcium gluconate, Lasix, insulin and D50. Now hypokalemia > being replaced. Continue to monitor  Hyponatremia: resolved with IV hydration. Now hypernatremia. IVF discontinued, Continue to monitor.  Metabolic acidosis: Resolved with sodium bicarbonate infusion.  Lactic acidosis > Improved. IVF  discontinued.  Lactic acid improving 6.0 > 2.7  Acute blood loss anemia likely secondary to multiple myeloma: S/p 2 unit PRBC.  Hemoglobin 7.4 >8.2 > 8.5 Keep hemoglobin above 7.  Tachycardia: Likely multifactorial due to anemia, dehydration, suspected sepsis. One of the 4 blood culture showed Staphylococcus Capitis.  Likely contaminant  Multiple myeloma with diffuse pathological fractures Mild thrombocytopenia: Oncology is consulted recommended Decadron and Velcade. Palliative consulted to discuss goals of care.  Large mass involving T4 vertebral body with soft tissue component: MRI with and without C and T-spine when renal functions improves.  Acute metabolic encephalopathy Likely secondary to multiple myeloma, uremia and others. Minimize sedation,  reorient as above. Continue Haldol as needed for agitation and restlessness. Psychiatry consulted for agitation, restlessness.   Constipation: Aggressive bowel regimen.  Hypothyroidism: Continue Synthroid.  DVT prophylaxis: SCDs Code Status: DNR Family Communication: Wife at bed side. Disposition Plan:    Status is: Inpatient Remains inpatient appropriate because: Admitted for AKI on CKD stage IV in the setting of multiple myeloma,  not been treated. AKI is improving,  not a candidate for hemodialysis. PT/OT recommended CIR.   Consultants:  PCCM Nephrology  Procedures:None  Antimicrobials:  Anti-infectives (From admission, onward)    Start     Dose/Rate Route Frequency Ordered Stop   12/11/22 1000  fluconazole (DIFLUCAN) tablet 100 mg        100 mg Oral Daily 12/11/22 0844 12/16/22 0959   12/05/22 1000  cefTRIAXone (ROCEPHIN) 2 g in sodium chloride 0.9 % 100 mL IVPB        2 g 200 mL/hr over 30 Minutes Intravenous Every 24 hours 12/04/22 1340 12/08/22 1008   12/04/22 0600  ceFEPIme (  MAXIPIME) 2 g in sodium chloride 0.9 % 100 mL IVPB        2 g 200 mL/hr over 30 Minutes Intravenous  Once 12/04/22 0557 12/04/22 0726    12/04/22 0600  metroNIDAZOLE (FLAGYL) IVPB 500 mg        500 mg 100 mL/hr over 60 Minutes Intravenous  Once 12/04/22 0557 12/04/22 0829   12/04/22 0600  vancomycin (VANCOCIN) IVPB 1000 mg/200 mL premix        1,000 mg 200 mL/hr over 60 Minutes Intravenous  Once 12/04/22 0557 12/04/22 1610      Subjective: Patient was seen and examined at bedside. Overnight events noted.  Last night he was agitated and restless requiring dose of Haldol. Patient seems improved.  He is alert and oriented following commands.  Objective: Vitals:   12/12/22 0500 12/12/22 0600 12/12/22 0700 12/12/22 0800  BP: (!) 121/103 (!) 140/52 133/69   Pulse: 66 64 100 79  Resp: 19 16 13 11   Temp:    (!) 97.5 F (36.4 C)  TempSrc:    Axillary  SpO2: 97% 97% 95% 97%  Weight: 70.8 kg     Height:        Intake/Output Summary (Last 24 hours) at 12/12/2022 1045 Last data filed at 12/12/2022 0045 Gross per 24 hour  Intake 518.72 ml  Output 1250 ml  Net -731.28 ml   Filed Weights   12/10/22 0500 12/11/22 0500 12/12/22 0500  Weight: 71.3 kg 70.5 kg 70.8 kg    Examination:  General exam: Appears comfortable, deconditioned, not in any acute distress, legally blind. Respiratory system: CTA bilaterally. Respiratory effort normal. RR 13 Cardiovascular system: S1 & S2 heard, RRR. No JVD, murmurs, rubs, gallops or clicks. Gastrointestinal system: Abdomen is soft, non tender, non distended, bowel sounds present Central nervous system: Alert and oriented x 2. No focal neurological deficits. Extremities: No edema, no cyanosis, no clubbing  Skin: No rashes, lesions or ulcers Psychiatry:  Mood & affect appropriate.     Data Reviewed: I have personally reviewed following labs and imaging studies  CBC: Recent Labs  Lab 12/06/22 0932 12/07/22 0327 12/08/22 0321 12/09/22 0323 12/10/22 1043  WBC 4.9 5.1  --  6.5 4.3  NEUTROABS 3.9 4.0  --   --   --   HGB 9.2* 8.2* 7.8* 8.1* 8.5*  HCT 29.3* 26.4* 24.8* 26.3*  28.1*  MCV 91.8 92.3  --  93.9 95.6  PLT 106* 116*  --  115* 105*   Basic Metabolic Panel: Recent Labs  Lab 12/07/22 0501 12/08/22 0321 12/09/22 0323 12/10/22 1043 12/11/22 0614 12/12/22 0310  NA 140 142 141  142 142 146* 146*  K 4.1 4.0 3.6  3.6 3.4* 3.3* 3.1*  CL 101 108 106  107 108 113* 111  CO2 26 24 22  22 22  21* 23  GLUCOSE 146* 185* 168*  169* 206* 143* 139*  BUN 111* 106* 99*  99* 85* 69* 59*  CREATININE 6.78* 5.13* 4.01*  4.09* 3.00* 2.59* 2.34*  CALCIUM 7.5* 7.1* 7.2*  7.1* 6.8* 7.1* 7.2*  MG 3.7*  --  2.8*  --  2.0 1.9  PHOS 5.8*  --  4.0  4.0  --  2.3* 3.4   GFR: Estimated Creatinine Clearance: 26.1 mL/min (A) (by C-G formula based on SCr of 2.34 mg/dL (H)). Liver Function Tests: Recent Labs  Lab 12/06/22 0615 12/09/22 0323  AST 31  --   ALT 19  --   ALKPHOS 58  --  BILITOT 0.9  --   PROT 9.4*  --   ALBUMIN 1.9* 2.0*   No results for input(s): "LIPASE", "AMYLASE" in the last 168 hours. No results for input(s): "AMMONIA" in the last 168 hours. Coagulation Profile: No results for input(s): "INR", "PROTIME" in the last 168 hours.  Cardiac Enzymes: No results for input(s): "CKTOTAL", "CKMB", "CKMBINDEX", "TROPONINI" in the last 168 hours.  BNP (last 3 results) No results for input(s): "PROBNP" in the last 8760 hours. HbA1C: No results for input(s): "HGBA1C" in the last 72 hours. CBG: Recent Labs  Lab 12/11/22 1544 12/11/22 2040 12/11/22 2313 12/12/22 0326 12/12/22 0739  GLUCAP 133* 96 121* 120* 140*   Lipid Profile: No results for input(s): "CHOL", "HDL", "LDLCALC", "TRIG", "CHOLHDL", "LDLDIRECT" in the last 72 hours. Thyroid Function Tests: No results for input(s): "TSH", "T4TOTAL", "FREET4", "T3FREE", "THYROIDAB" in the last 72 hours. Anemia Panel: No results for input(s): "VITAMINB12", "FOLATE", "FERRITIN", "TIBC", "IRON", "RETICCTPCT" in the last 72 hours. Sepsis Labs: Recent Labs  Lab 12/05/22 1219 12/06/22 0615  12/07/22 0501 12/08/22 0321  PROCALCITON 5.01 3.72 2.42 1.23    Recent Results (from the past 240 hour(s))  Blood Culture (routine x 2)     Status: Abnormal   Collection Time: 12/04/22  6:16 AM   Specimen: BLOOD  Result Value Ref Range Status   Specimen Description   Final    BLOOD RIGHT ANTECUBITAL Performed at [facility], [address], [place], [place] [address]    Special Requests   Final    BOTTLES DRAWN AEROBIC AND ANAEROBIC Blood Culture adequate volume Performed at [facility], [address], [place], [place] [address]    Culture  Setup Time   Final    GRAM POSITIVE COCCI IN CLUSTERS AEROBIC BOTTLE ONLY CRITICAL RESULT CALLED TO, READ BACK BY AND VERIFIED WITH: PHARMD M. Doran Durand 12/05/22 @ 0604 BY AB    Culture (A)  Final    STAPHYLOCOCCUS CAPITIS THE SIGNIFICANCE OF ISOLATING THIS ORGANISM FROM A SINGLE SET OF BLOOD CULTURES WHEN MULTIPLE SETS ARE DRAWN IS UNCERTAIN. PLEASE NOTIFY THE MICROBIOLOGY DEPARTMENT WITHIN ONE WEEK IF SPECIATION AND SENSITIVITIES ARE REQUIRED. Performed at [facility], [address], [place], [place] [address]    Report Status 12/06/2022 FINAL  Final  Blood Culture ID Panel (Reflexed)     Status: Abnormal   Collection Time: 12/04/22  6:16 AM  Result Value Ref Range Status   Enterococcus faecalis NOT DETECTED NOT DETECTED Final   Enterococcus Faecium NOT DETECTED NOT DETECTED Final   Listeria monocytogenes NOT DETECTED NOT DETECTED Final   Staphylococcus species DETECTED (A) NOT DETECTED Final    Comment: CRITICAL RESULT CALLED TO, READ BACK BY AND VERIFIED WITH: PHARMD M. LILLISTON 12/05/22 @ 0604 BY AB    Staphylococcus aureus (BCID) NOT DETECTED NOT DETECTED Final   Staphylococcus epidermidis NOT DETECTED NOT DETECTED Final   Staphylococcus lugdunensis NOT DETECTED NOT DETECTED Final   Streptococcus species NOT DETECTED NOT DETECTED Final   Streptococcus agalactiae NOT DETECTED NOT  DETECTED Final   Streptococcus pneumoniae NOT DETECTED NOT DETECTED Final   Streptococcus pyogenes NOT DETECTED NOT DETECTED Final   A.calcoaceticus-baumannii NOT DETECTED NOT DETECTED Final   Bacteroides fragilis NOT DETECTED NOT DETECTED Final   Enterobacterales NOT DETECTED NOT DETECTED Final   Enterobacter cloacae complex NOT DETECTED NOT DETECTED Final   Escherichia coli NOT DETECTED NOT DETECTED Final   Klebsiella aerogenes NOT DETECTED NOT DETECTED Final  Klebsiella oxytoca NOT DETECTED NOT DETECTED Final   Klebsiella pneumoniae NOT DETECTED NOT DETECTED Final   Proteus species NOT DETECTED NOT DETECTED Final   Salmonella species NOT DETECTED NOT DETECTED Final   Serratia marcescens NOT DETECTED NOT DETECTED Final   Haemophilus influenzae NOT DETECTED NOT DETECTED Final   Neisseria meningitidis NOT DETECTED NOT DETECTED Final   Pseudomonas aeruginosa NOT DETECTED NOT DETECTED Final   Stenotrophomonas maltophilia NOT DETECTED NOT DETECTED Final   Candida albicans NOT DETECTED NOT DETECTED Final   Candida auris NOT DETECTED NOT DETECTED Final   Candida glabrata NOT DETECTED NOT DETECTED Final   Candida krusei NOT DETECTED NOT DETECTED Final   Candida parapsilosis NOT DETECTED NOT DETECTED Final   Candida tropicalis NOT DETECTED NOT DETECTED Final   Cryptococcus neoformans/gattii NOT DETECTED NOT DETECTED Final    Comment: Performed at [facility], [address], [place], [place] [address]  Blood Culture (routine x 2)     Status: None   Collection Time: 12/04/22  6:30 AM   Specimen: BLOOD  Result Value Ref Range Status   Specimen Description   Final    BLOOD LEFT ANTECUBITAL Performed at [facility], [address] [place], [place] [address]    Special Requests   Final    BOTTLES DRAWN AEROBIC AND ANAEROBIC Blood Culture adequate volume Performed at [facility], [address] [place], [place] [address]    Culture   Final     NO GROWTH 5 DAYS Performed at [facility], [address], [place], [place] [address]    Report Status 12/09/2022 FINAL  Final  Resp panel by RT-PCR (RSV, Flu A&B, Covid) Anterior Nasal Swab     Status: None   Collection Time: 12/04/22  8:15 AM   Specimen: Anterior Nasal Swab  Result Value Ref Range Status   SARS Coronavirus 2 by RT PCR NEGATIVE NEGATIVE Final    Comment: (NOTE) SARS-CoV-2 target nucleic acids are NOT DETECTED.  The SARS-CoV-2 RNA is generally detectable in upper respiratory specimens during the acute phase of infection. The lowest concentration of SARS-CoV-2 viral copies this assay can detect is 138 copies/mL. A negative result does not preclude SARS-Cov-2 infection and should not be used as the sole basis for treatment or other patient management decisions. A negative result may occur with  improper specimen collection/handling, submission of specimen other than nasopharyngeal swab, presence of viral mutation(s) within the areas targeted by this assay, and inadequate number of viral copies(<138 copies/mL). A negative result must be combined with clinical observations, patient history, and epidemiological information. The expected result is Negative.  Fact Sheet for Patients:  BloggerCourse.com  Fact Sheet for Healthcare Providers:  SeriousBroker.it  This test is no t yet approved or cleared by the [place] FDA and  has been authorized for detection and/or diagnosis of SARS-CoV-2 by FDA under an Emergency Use Authorization (EUA). This EUA will remain  in effect (meaning this test can be used) for the duration of the COVID-19 declaration under Section 564(b)(1) of the Act, 21 U.S.C.section 360bbb-3(b)(1), unless the authorization is terminated  or revoked sooner.       Influenza A by PCR NEGATIVE NEGATIVE Final   Influenza B by PCR NEGATIVE NEGATIVE Final    Comment: (NOTE) The Xpert Xpress  SARS-CoV-2/FLU/RSV plus assay is intended as an aid in the diagnosis of influenza from Nasopharyngeal swab specimens and should not be used as a sole basis for  treatment. Nasal washings and aspirates are unacceptable for Xpert Xpress SARS-CoV-2/FLU/RSV testing.  Fact Sheet for Patients: BloggerCourse.com  Fact Sheet for Healthcare Providers: SeriousBroker.it  This test is not yet approved or cleared by the [place] FDA and has been authorized for detection and/or diagnosis of SARS-CoV-2 by FDA under an Emergency Use Authorization (EUA). This EUA will remain in effect (meaning this test can be used) for the duration of the COVID-19 declaration under Section 564(b)(1) of the Act, 21 U.S.C. section 360bbb-3(b)(1), unless the authorization is terminated or revoked.     Resp Syncytial Virus by PCR NEGATIVE NEGATIVE Final    Comment: (NOTE) Fact Sheet for Patients: BloggerCourse.com  Fact Sheet for Healthcare Providers: SeriousBroker.it  This test is not yet approved or cleared by the [place] FDA and has been authorized for detection and/or diagnosis of SARS-CoV-2 by FDA under an Emergency Use Authorization (EUA). This EUA will remain in effect (meaning this test can be used) for the duration of the COVID-19 declaration under Section 564(b)(1) of the Act, 21 U.S.C. section 360bbb-3(b)(1), unless the authorization is terminated or revoked.  Performed at [facility], [address] [place], [place] [address]   MRSA Next Gen by PCR, Nasal     Status: None   Collection Time: 12/04/22 10:57 AM   Specimen: Nasal Mucosa; Nasal Swab  Result Value Ref Range Status   MRSA by PCR Next Gen NOT DETECTED NOT DETECTED Final    Comment: (NOTE) The GeneXpert MRSA Assay (FDA approved for NASAL specimens only), is one component of a comprehensive MRSA colonization  surveillance program. It is not intended to diagnose MRSA infection nor to guide or monitor treatment for MRSA infections. Test performance is not FDA approved in patients less than 13 years old. Performed at [facility], [address], [place], [place] [address]   Culture, blood (Routine X 2) w Reflex to ID Panel     Status: None   Collection Time: 12/06/22  6:15 AM   Specimen: BLOOD  Result Value Ref Range Status   Specimen Description   Final    BLOOD BLOOD RIGHT ARM Performed at [facility], [address] [place], [place] [address]    Special Requests   Final    BOTTLES DRAWN AEROBIC ONLY Blood Culture adequate volume Performed at [facility], [address], [place], [place] [address]    Culture   Final    NO GROWTH 5 DAYS Performed at [facility], [address] [place], [place] [address]    Report Status 12/11/2022 FINAL  Final  Culture, blood (Routine X 2) w Reflex to ID Panel     Status: None   Collection Time: 12/06/22  6:30 AM   Specimen: BLOOD  Result Value Ref Range Status   Specimen Description   Final    BLOOD BLOOD LEFT FOREARM Performed at [facility], [address], [place], [place] [address]    Special Requests   Final    BOTTLES DRAWN AEROBIC ONLY Blood Culture adequate volume Performed at [facility], [address], [place], [place] [address]    Culture   Final    NO GROWTH 5 DAYS Performed at [facility], [address], [place], [place] [address]    Report Status 12/11/2022 FINAL  Final   Radiology Studies: No results found.  Scheduled Meds:  amLODipine  5 mg Oral Daily   brimonidine  1 drop Both Eyes TID   Chlorhexidine Gluconate Cloth  6 each Topical Q2200   dexamethasone  20 mg Oral Daily   ferrous sulfate  325 mg Oral QODAY   fluconazole  100 mg Oral Daily   insulin aspart  0-15 Units Subcutaneous Q4H   latanoprost  1  drop Both Eyes QHS   levothyroxine  150 mcg Oral Q0600   pantoprazole  40 mg Oral BID   polyethylene glycol  17 g Oral Daily   potassium chloride  40 mEq Oral Once   sennosides  15 mL Oral BID   tamsulosin  0.4 mg Oral Daily   Continuous Infusions:   LOS: 8 days    Time spent: 35 mins    Willeen Niece, MD [facility]   If 7PM-7AM, please contact night-coverage

## 2022-12-12 NOTE — Progress Notes (Signed)
SLP Cancellation Note  Patient Details Name: Scott Bass MRN: [id number] DOB: [birth year]   Cancelled treatment:       Reason Eval/Treat Not Completed: Other (comment);Fatigue/lethargy limiting ability to participate (pt sound asleep, open mouth snoring earlier today, will continue efforts)   Rolena Infante, MS [facility] SLP Acute Rehab Services Office [phone]   Chales Abrahams 12/12/2022, 2:55 PM

## 2022-12-12 NOTE — Progress Notes (Signed)
Physical Therapy Treatment Patient Details Name: Scott Bass MRN: [id number] DOB: 1951/02/24 Today's Date: 12/12/2022   History of Present Illness 72 year old male presented for fall and found confused, tachycardic and constipated with labs significant for AKI with hyperkalemia and severe metabolic acidosis and Hg 3.5. LA 6.0;  CTs 12/04/2022-innumerable lytic lesions, large expansile lesion in the anterior right second rib, multiple pathologic rib fractures, expansile T4 (T3 on CT cervical spine) mass with soft tissue component extending into the central canal and left neural foramen, C7 transverse process fracture, displaced fracture of the lower RIGHT scapula, of uncertain age. PHMx: blindness (can see some, but not details), recently diagnosed multiple myeloma in May 2024 but has not started therapy (diffuse lesions) , HTN, DM    PT Comments  The patient was cooperative with therapy, worked with patient on sit to stand and   stepping forward and back.  Patient  should be able to  progress with ambulation with chair follow next visit.  Max HR 124 with activity.    If plan is discharge home, recommend the following: A little help with walking and/or transfers;A little help with bathing/dressing/bathroom;Assistance with cooking/housework;Assist for transportation;Help with stairs or ramp for entrance   Can travel by private vehicle        Equipment Recommendations  None recommended by PT    Recommendations for Other Services       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); wife totally blind Restrictions Weight Bearing Restrictions: No     Mobility  Bed Mobility   Bed Mobility: Supine to Sit, Sit to Supine     Supine to sit: Min guard Sit to supine: Min assist   General bed mobility comments: assist with legs  back onto bed.    Transfers Overall transfer level: Needs assistance Equipment used: Rolling  walker (2 wheels), None Transfers: Sit to/from Stand Sit to Stand: Min assist           General transfer comment: multimodal cues, tactile guidance for hand placement onto RW, Patient practiced sit to stand x 3    Ambulation/Gait     Assistive device: Rolling walker (2 wheels) Gait Pattern/deviations: Step-to pattern Gait velocity: decr     General Gait Details: practiced to  march in place x 10 , then stepped forward  x 5 steps and backward.   Stairs             Wheelchair Mobility     Tilt Bed    Modified Rankin (Stroke Patients Only)       Balance Overall balance assessment: Needs assistance Sitting-balance support: No upper extremity supported, Feet supported Sitting balance-Leahy Scale: Good     Standing balance support: Reliant on assistive device for balance Standing balance-Leahy Scale: Poor                              Cognition Arousal/Alertness: Awake/alert Behavior During Therapy: Flat affect Overall Cognitive Status: Within Functional Limits for tasks assessed                                 General Comments: follows directions, not very vocal,        Exercises      General Comments        Pertinent Vitals/Pain Pain Assessment Pain Assessment: No/denies pain  Home Living                          Prior Function            PT Goals (current goals can now be found in the care plan section) Progress towards PT goals: Progressing toward goals    Frequency    Min 1X/week      PT Plan Current plan remains appropriate    Co-evaluation              AM-PAC PT "6 Clicks" Mobility   Outcome Measure  Help needed turning from your back to your side while in a flat bed without using bedrails?: A Little Help needed moving from lying on your back to sitting on the side of a flat bed without using bedrails?: A Little Help needed moving to and from a bed to a chair (including a  wheelchair)?: A Lot Help needed standing up from a chair using your arms (e.g., wheelchair or bedside chair)?: A Lot Help needed to walk in hospital room?: Total Help needed climbing 3-5 steps with a railing? : Total 6 Click Score: 12    End of Session Equipment Utilized During Treatment: Gait belt Activity Tolerance: Patient tolerated treatment well Patient left: in bed;with call bell/phone within reach;with bed alarm set;with family/visitor present Nurse Communication: Mobility status PT Visit Diagnosis: Unsteadiness on feet (R26.81);Other abnormalities of gait and mobility (R26.89);Difficulty in walking, not elsewhere classified (R26.2)     Time: 1240-1300 PT Time Calculation (min) (ACUTE ONLY): 20 min  Charges:    $Therapeutic Activity: 8-22 mins PT General Charges $$ ACUTE PT VISIT: 1 Visit                     Blanchard Kelch PT Acute Rehabilitation Services Office [phone] Weekend pager-[phone]    Rada Hay 12/12/2022, 1:30 PM

## 2022-12-12 NOTE — TOC Progression Note (Signed)
Transition of Care [facility]) - Progression Note    Patient Details  Name: Scott Bass MRN: [id number] Date of Birth: 17-Mar-1951  Transition of Care [facility]) CM/SW Contact  , Olegario Messier, RN Phone Number: 12/12/2022, 8:35 AM  Clinical Narrative:  Noted CIR following-await outcome.     Expected Discharge Plan: IP Rehab Facility Barriers to Discharge: Continued Medical Work up  Expected Discharge Plan and Services In-house Referral: Clinical Social Work     Living arrangements for the past 2 months: Single Family Home                                       Social Determinants of Health (SDOH) Interventions SDOH Screenings   Food Insecurity: Food Insecurity Present (12/06/2022)  Housing: Low Risk  (12/06/2022)  Transportation Needs: No Transportation Needs (12/06/2022)  Utilities: Not At Risk (12/06/2022)  Financial Resource Strain: Low Risk  (10/10/2022)   Received from [facility], [facility]  Physical Activity: Insufficiently Active (12/23/2020)   Received from [facility], [facility]  Social Connections: Unknown (09/16/2021)   Received from [facility], [facility]  Stress: No Stress Concern Present (12/23/2020)   Received from [facility], [facility]  Tobacco Use: Low Risk  (12/06/2022)    Readmission Risk Interventions    12/07/2022   11:22 AM  Readmission Risk Prevention Plan  HRI or Home Care Consult Complete  SW Recovery Care/Counseling Consult Complete

## 2022-12-13 ENCOUNTER — Ambulatory Visit (HOSPITAL_COMMUNITY): Payer: Medicare PPO

## 2022-12-13 DIAGNOSIS — N184 Chronic kidney disease, stage 4 (severe): Secondary | ICD-10-CM | POA: Diagnosis not present

## 2022-12-13 DIAGNOSIS — N170 Acute kidney failure with tubular necrosis: Secondary | ICD-10-CM | POA: Diagnosis not present

## 2022-12-13 LAB — VITAMIN B12: Vitamin B-12: 7429 pg/mL — ABNORMAL HIGH (ref 180–914)

## 2022-12-13 LAB — CBC WITH DIFFERENTIAL/PLATELET
Abs Immature Granulocytes: 0.1 10*3/uL — ABNORMAL HIGH (ref 0.00–0.07)
Basophils Absolute: 0 10*3/uL (ref 0.0–0.1)
Basophils Relative: 0 %
Eosinophils Absolute: 0.1 10*3/uL (ref 0.0–0.5)
Eosinophils Relative: 1 %
HCT: 30.4 % — ABNORMAL LOW (ref 39.0–52.0)
Hemoglobin: 9 g/dL — ABNORMAL LOW (ref 13.0–17.0)
Immature Granulocytes: 2 %
Lymphocytes Relative: 11 %
Lymphs Abs: 0.7 10*3/uL (ref 0.7–4.0)
MCH: 28.8 pg (ref 26.0–34.0)
MCHC: 29.6 g/dL — ABNORMAL LOW (ref 30.0–36.0)
MCV: 97.4 fL (ref 80.0–100.0)
Monocytes Absolute: 0.5 10*3/uL (ref 0.1–1.0)
Monocytes Relative: 7 %
Neutro Abs: 4.7 10*3/uL (ref 1.7–7.7)
Neutrophils Relative %: 79 %
Platelets: 132 10*3/uL — ABNORMAL LOW (ref 150–400)
RBC: 3.12 MIL/uL — ABNORMAL LOW (ref 4.22–5.81)
RDW: 19.3 % — ABNORMAL HIGH (ref 11.5–15.5)
WBC: 6.1 10*3/uL (ref 4.0–10.5)
nRBC: 0 % (ref 0.0–0.2)

## 2022-12-13 LAB — BASIC METABOLIC PANEL
Anion gap: 11 (ref 5–15)
BUN: 52 mg/dL — ABNORMAL HIGH (ref 8–23)
CO2: 22 mmol/L (ref 22–32)
Calcium: 7.5 mg/dL — ABNORMAL LOW (ref 8.9–10.3)
Chloride: 108 mmol/L (ref 98–111)
Creatinine, Ser: 2.45 mg/dL — ABNORMAL HIGH (ref 0.61–1.24)
GFR, Estimated: 27 mL/min — ABNORMAL LOW (ref >60)
Glucose, Bld: 124 mg/dL — ABNORMAL HIGH (ref 70–99)
Potassium: 3.8 mmol/L (ref 3.5–5.1)
Sodium: 141 mmol/L (ref 135–145)

## 2022-12-13 LAB — GLUCOSE, CAPILLARY
Glucose-Capillary: 102 mg/dL — ABNORMAL HIGH (ref 70–99)
Glucose-Capillary: 122 mg/dL — ABNORMAL HIGH (ref 70–99)
Glucose-Capillary: 130 mg/dL — ABNORMAL HIGH (ref 70–99)
Glucose-Capillary: 132 mg/dL — ABNORMAL HIGH (ref 70–99)
Glucose-Capillary: 141 mg/dL — ABNORMAL HIGH (ref 70–99)
Glucose-Capillary: 86 mg/dL (ref 70–99)

## 2022-12-13 LAB — FOLATE: Folate: 3.7 ng/mL — ABNORMAL LOW (ref >5.9)

## 2022-12-13 NOTE — Care Management Important Message (Signed)
Important Message  Patient Details IM Letter given. Name: KARMELLO SILCOX MRN: [id number] Date of Birth: 1951-04-25   Medicare Important Message Given:  Yes     Caren Macadam 12/13/2022, 11:10 AM

## 2022-12-13 NOTE — Progress Notes (Signed)
Inpatient Rehab Admissions Coordinator:    I am following for potential admit.I have insurance auth for admit; however, all CIR beds have been filled for today. I will follow for potential admit pending bed availability  Megan Salon, MS, CCC-SLP Rehab Admissions Coordinator  [phone] (celll) [phone] (office)

## 2022-12-13 NOTE — Plan of Care (Signed)

## 2022-12-13 NOTE — Progress Notes (Signed)
Speech Language Pathology Treatment: Dysphagia  Patient Details Name: PELE STUMPO MRN: [id number] DOB: 1951/05/07 Today's Date: 12/13/2022 Time: 4098-1191 SLP Time Calculation (min) (ACUTE ONLY): 14 min  Assessment / Plan / Recommendation Clinical Impression  Patient seen for skilled dysphagia management.  Sitting upright in bed with very minimal intake of breakfast noted as he stated he is not hungry.  RN reports patient is not happy with a previous bolus for rate control Usage stating "it is not good" upon clearing water from cup thickened liquids in cup.  Prevail cup is only for thin liquids - water- and unfortunately the swallow precautions on did not follow patient from ICU to the floor.  SLP observed patient with significant encouragement consuming graham cracker softened with applesauce, grits, nectar thick orange juice, and thin water.  Ongoing intermittent cough after water present which patient states is baseline.  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  However given patient's improvement we will advance to allowing thin water via any cup between meals and consume nectar liquids with meals.  Patient agreeable to plan.  Using teach back he was able to verbalize reasoning for his chronic dysphagia.  He also endorses coughing up medicine at home after it lodges in his throat at times, lending to his report of an suspicion of chronic dysphagia.  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 LA 6.0. PCCM consulted for admission. In the ED started on broad spectrum abx and fluid resuscitation; CT chest indicated No acute findings within the  chest, abdomen or pelvis. No  evidence of solid organ injury. No hemorrhage or edema within the  mediastinum. No pleural effusion or pneumothorax ; ST consulted for swallow evaluation d/t dysphagia noted with liquids per nursing report.      SLP Plan  Continue with current plan of care      Recommendations for follow up therapy are one component of a multi-disciplinary discharge planning process, led by the attending physician.  Recommendations may be updated based on patient status, additional functional criteria and insurance authorization.    Recommendations  Diet recommendations: Dysphagia 2 (fine chop);Nectar-thick liquid (water) Liquids provided via: Cup;Straw Medication Administration: Crushed with puree Supervision: Full supervision/cueing for compensatory strategies Compensations: Slow rate;Small sips/bites Postural Changes and/or Swallow Maneuvers: Seated upright 90 degrees;Upright 30-60 min after meal                  Oral care BID;Staff/trained caregiver to provide oral care   Frequent or constant Supervision/Assistance Dysphagia, oropharyngeal phase (R13.12)     Continue with current plan of care    Rolena Infante, MS [facility] SLP Acute Rehab Services Office [phone]  Chales Abrahams  12/13/2022, 9:09 AM

## 2022-12-13 NOTE — Progress Notes (Signed)
[facility] Progress Note  Patient: Scott Bass    HQI:[id number]  DOA: 12/04/2022     Date of Service: the patient was seen and examined on 12/13/2022  Chief Complaint  Patient presents with   Fall    Pt fell at home on carpet while walking with walkers, pt CBG per EMS 318 , tachy 140. Pt visionally impaired , vision in left eye only, wife called, also vision impaired, dementia , pt on metformin, and ASA. Pt denies LOC or hitting head    Brief hospital course: This 72 years old wheelchair-bound male with PMH significant for blindness, recently diagnosed multiple myeloma in May 2024 but has not started therapy yet, hypertension, diabetes mellitus presented s/p fall and found to be confused, tachycardic and constipated with labs significant for AKI with hyperkalemia and severe metabolic acidosis.  He was also found to have hemoglobin 3.5, lactic acid 6.0.  Patient was initially admitted in the ICU and started on broad-spectrum antibiotics and fluid resuscitation.  Patient has received 3 units of packed red blood cells.  hemoglobin 7.5.  TRH pickup 12/06/2022.    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  Not a candidate for hemodialysis given advanced malignancy, comorbidity and functional status. Serum creatinine improving 11.76 > > >2.45 IV fluids discontinued, serum creatinine continues to improve. Good urine output, Foley was removed 8/3.   Multiple myeloma with diffuse pathological fractures Mild thrombocytopenia: Oncology is consulted recommended Decadron and Velcade. Palliative consulted to discuss goals of care. Velcade dose will be given on 8/8 and then plan is to start weekly dosing as per oncologist   Hyperkalemia: Resolved with calcium gluconate, Lasix, insulin and D50. Now hypokalemia > being replaced. Continue to monitor Potassium  3.8, within  normal range now  Hyponatremia: resolved with IV hydration. Now hypernatremia. IVF discontinued, Continue to monitor. Sodium 141, within normal range now  Metabolic acidosis: Resolved s/p sodium bicarbonate infusion.   Lactic acidosis > Improved. IVF discontinued.  Lactic acid improving 6.0 > 2.7   Acute anemia likely secondary to multiple myeloma: S/p 2 unit PRBC.  Hemoglobin 7.4 >8.2 > 9.0 Keep hemoglobin above 7.   Tachycardia: Likely multifactorial due to anemia, dehydration, suspected sepsis. One of the 4 blood culture showed Staphylococcus Capitis.  Likely contaminant   Large mass involving T4 vertebral body with soft tissue component: MRI with and without C and T-spine when renal functions improves.   Acute metabolic encephalopathy Likely secondary to multiple myeloma, uremia and others. Minimize sedation,  reorient as above. Continue Haldol as needed for agitation and restlessness. Psychiatry consulted for agitation, restlessness.    Constipation: Aggressive bowel regimen.   Hypothyroidism: Continue Synthroid. Glaucoma, continue eyedrops.  Right eye blind, very poor vision.  Patient cannot see through left eye   Body mass index is 23.2 kg/m.  Nutrition Problem: Moderate Malnutrition Etiology: chronic illness Interventions: Interventions: Refer to RD note for recommendations, Magic cup, Hormel Shake   Diet: Dysphagia 2 diet DVT Prophylaxis: SCD, pharmacological prophylaxis contraindicated due to symptomatic anemia    Advance goals of care discussion: DNR  Family Communication: family was not present at bedside, at the time of interview.  The pt provided permission to discuss medical plan with the family. Opportunity was given to ask question and all questions were answered satisfactorily.   Disposition:  Pt is from Home, admitted with acute renal failure, multiple myeloma and  anemia.  Clinically improving, medically optimized, need to start Velcade for  multiple myeloma.  Stable to discharge to CIR tomorrow a.m. TOC following for discharge planning   Subjective: No significant events overnight, patient was resting fairly, slightly confused AO x 2.  Denies any chest pain or palpitation, no shortness of breath.  Patient says that he has very poor vision in the right eye cannot see but he cannot see through the left eye.  Physical Exam: General: NAD, lying comfortably Appear in no distress, affect appropriate Eyes: PERRLA, right eye blindness, patient cannot see through left eye ENT: Oral Mucosa Clear, moist  Neck: no JVD,  Cardiovascular: S1 and S2 Present, no Murmur,  Respiratory: good respiratory effort, Bilateral Air entry equal and Decreased, no Crackles, no wheezes Abdomen: Bowel Sound present, Soft and no tenderness,  Skin: no rashes Extremities: no Pedal edema, no calf tenderness Neurologic: without any new focal findings Gait not checked due to patient safety concerns  Vitals:   12/12/22 1946 12/13/22 0402 12/13/22 0441 12/13/22 1406  BP: 136/71  135/70 (!) 118/57  Pulse: 94  (!) 101 100  Resp: 17  18 16   Temp: 98.2 F (36.8 C)  98 F (36.7 C) 98 F (36.7 C)  TempSrc: Oral     SpO2: 100%  99% 98%  Weight:  65.2 kg    Height:        Intake/Output Summary (Last 24 hours) at 12/13/2022 1459 Last data filed at 12/13/2022 0443 Gross per 24 hour  Intake 400 ml  Output 1450 ml  Net -1050 ml   Filed Weights   12/11/22 0500 12/12/22 0500 12/13/22 0402  Weight: 70.5 kg 70.8 kg 65.2 kg    Data Reviewed: I have personally reviewed and interpreted daily labs, tele strips, imagings as discussed above. I reviewed all nursing notes, pharmacy notes, vitals, pertinent old records I have discussed plan of care as described above with RN and patient/family.  CBC: Recent Labs  Lab 12/07/22 0327 12/08/22 0321 12/09/22 0323 12/10/22 1043 12/13/22 0956  WBC 5.1  --  6.5 4.3 6.1  NEUTROABS 4.0  --   --   --  4.7  HGB 8.2* 7.8*  8.1* 8.5* 9.0*  HCT 26.4* 24.8* 26.3* 28.1* 30.4*  MCV 92.3  --  93.9 95.6 97.4  PLT 116*  --  115* 105* 132*   Basic Metabolic Panel: Recent Labs  Lab 12/07/22 0501 12/08/22 0321 12/09/22 0323 12/10/22 1043 12/11/22 0614 12/12/22 0310 12/13/22 0547  NA 140   < > 141  142 142 146* 146* 141  K 4.1   < > 3.6  3.6 3.4* 3.3* 3.1* 3.8  CL 101   < > 106  107 108 113* 111 108  CO2 26   < > 22  22 22  21* 23 22  GLUCOSE 146*   < > 168*  169* 206* 143* 139* 124*  BUN 111*   < > 99*  99* 85* 69* 59* 52*  CREATININE 6.78*   < > 4.01*  4.09* 3.00* 2.59* 2.34* 2.45*  CALCIUM 7.5*   < > 7.2*  7.1* 6.8* 7.1* 7.2* 7.5*  MG 3.7*  --  2.8*  --  2.0 1.9  --   PHOS 5.8*  --  4.0  4.0  --  2.3* 3.4  --    < > = values in this interval not displayed.    Studies: No results found.  Scheduled Meds:  amLODipine  5 mg Oral Daily  brimonidine  1 drop Both Eyes TID   dexamethasone  20 mg Oral Daily   ferrous sulfate  325 mg Oral QODAY   fluconazole  100 mg Oral Daily   insulin aspart  0-15 Units Subcutaneous Q4H   latanoprost  1 drop Both Eyes QHS   levothyroxine  150 mcg Oral Q0600   pantoprazole  40 mg Oral BID   polyethylene glycol  17 g Oral Daily   sennosides  15 mL Oral BID   tamsulosin  0.4 mg Oral Daily   Continuous Infusions: PRN Meds: docusate sodium, haloperidol lactate, hydrALAZINE, HYDROmorphone (DILAUDID) injection, ondansetron (ZOFRAN) IV, ondansetron, mouth rinse, phenol, polyethylene glycol, simethicone  Time spent: 35 minutes  Author: Gillis Santa. MD [facility] 12/13/2022 2:59 PM  To reach On-call, see care teams to locate the attending and reach out to them via [email or web address]. If 7PM-7AM, please contact night-coverage If you still have difficulty reaching the attending provider, please page the [facility] (Director on Call) for [facility] on amion for assistance.

## 2022-12-14 DIAGNOSIS — N170 Acute kidney failure with tubular necrosis: Secondary | ICD-10-CM | POA: Diagnosis not present

## 2022-12-14 DIAGNOSIS — N184 Chronic kidney disease, stage 4 (severe): Secondary | ICD-10-CM | POA: Diagnosis not present

## 2022-12-14 DIAGNOSIS — C9000 Multiple myeloma not having achieved remission: Secondary | ICD-10-CM | POA: Diagnosis not present

## 2022-12-14 LAB — GLUCOSE, CAPILLARY
Glucose-Capillary: 108 mg/dL — ABNORMAL HIGH (ref 70–99)
Glucose-Capillary: 111 mg/dL — ABNORMAL HIGH (ref 70–99)
Glucose-Capillary: 117 mg/dL — ABNORMAL HIGH (ref 70–99)
Glucose-Capillary: 135 mg/dL — ABNORMAL HIGH (ref 70–99)
Glucose-Capillary: 164 mg/dL — ABNORMAL HIGH (ref 70–99)
Glucose-Capillary: 209 mg/dL — ABNORMAL HIGH (ref 70–99)
Glucose-Capillary: 67 mg/dL — ABNORMAL LOW (ref 70–99)
Glucose-Capillary: 84 mg/dL (ref 70–99)

## 2022-12-14 MED ORDER — METHYLPREDNISOLONE SODIUM SUCC 125 MG IJ SOLR: 125.0000 mg | Freq: Once | INTRAMUSCULAR | Status: AC | PRN

## 2022-12-14 MED ORDER — ALBUTEROL SULFATE HFA 108 (90 BASE) MCG/ACT IN AERS: 2.0000 | INHALATION_SPRAY | Freq: Once | RESPIRATORY_TRACT | Status: AC | PRN

## 2022-12-14 MED ORDER — BORTEZOMIB CHEMO SQ INJECTION 3.5 MG (2.5MG/ML)
1.5000 mg/m2 | Freq: Once | INTRAMUSCULAR | Status: AC
  Administered 2022-12-14: 2.75 mg via SUBCUTANEOUS
  Filled 2022-12-14: qty 1.1

## 2022-12-14 MED ORDER — FAMOTIDINE IN NACL 20-0.9 MG/50ML-% IV SOLN: 20.0000 mg | Freq: Once | INTRAVENOUS | Status: AC | PRN

## 2022-12-14 MED ORDER — DIPHENHYDRAMINE HCL 50 MG/ML IJ SOLN: 50.0000 mg | Freq: Once | INTRAMUSCULAR | Status: AC | PRN

## 2022-12-14 MED ORDER — EPINEPHRINE 0.3 MG/0.3ML IJ SOAJ: 0.3000 mg | Freq: Once | INTRAMUSCULAR | Status: AC | PRN

## 2022-12-14 MED ORDER — FOLIC ACID 1 MG PO TABS
1.0000 mg | ORAL_TABLET | Freq: Every day | ORAL | Status: DC
  Administered 2022-12-14 – 2022-12-29 (×16): 1 mg via ORAL
  Filled 2022-12-14 (×16): qty 1

## 2022-12-14 MED ORDER — SODIUM BICARBONATE 650 MG PO TABS
650.0000 mg | ORAL_TABLET | Freq: Three times a day (TID) | ORAL | Status: AC
  Administered 2022-12-14 – 2022-12-16 (×6): 650 mg via ORAL
  Filled 2022-12-14 (×8): qty 1

## 2022-12-14 MED ORDER — SODIUM CHLORIDE 0.9 % IV SOLN: Freq: Once | INTRAVENOUS | Status: DC | PRN

## 2022-12-14 NOTE — Progress Notes (Signed)
Occupational Therapy Treatment Patient Details Name: Scott Bass MRN: [id number] DOB: 12-13-1950 Today's Date: 12/14/2022   History of present illness 72 year old male presented for fall and found confused, tachycardic and constipated with labs significant for AKI with hyperkalemia and severe metabolic acidosis and Hg 3.5. LA 6.0;  CTs 12/04/2022-innumerable lytic lesions, large expansile lesion in the anterior right second rib, multiple pathologic rib fractures, expansile T4 (T3 on CT cervical spine) mass with soft tissue component extending into the central canal and left neural foramen, C7 transverse process fracture, displaced fracture of the lower RIGHT scapula, of uncertain age. PHMx: blindness (can see some, but not details), recently diagnosed multiple myeloma in May 2024 but has not started therapy (diffuse lesions) , HTN, DM   OT comments  Pt in bed upon therapy arrival with nursing arriving shortly after to provide pills. Pt provided water intake with pills and had difficulty with one feeling stuck in his throat. Pt vomited up water although no pill seemed to have surfaced. Pt did not feel like pill was stuck anymore. Assisted pt with clean up and standing at EOB utilizing RW while NT changed bedding. VC and tactile provided during session for safety and direction d/t visual impairment. Pt assisted back to bed at end of session as he recently was sitting up in the chair. Continues to be appropriate for intensive inpatient follow up therapy, >3 hours/day. OT will continue to follow patient acutely.        If plan is discharge home, recommend the following:  A little help with walking and/or transfers;A lot of help with bathing/dressing/bathroom;Assistance with cooking/housework;Assistance with feeding;Assist for transportation;Help with stairs or ramp for entrance   Equipment Recommendations  BSC/3in1       Precautions / Restrictions Precautions Precautions: Fall Precaution Comments:  legally blind (can see some); wife blind also Restrictions Weight Bearing Restrictions: No       Mobility Bed Mobility Overal bed mobility: Needs Assistance Bed Mobility: Supine to Sit, Sit to Supine     Supine to sit: Supervision, HOB elevated Sit to supine: Supervision   General bed mobility comments: provided tactile cues and VC on direction and location of necessary items    Transfers Overall transfer level: Needs assistance Equipment used: Rolling walker (2 wheels) Transfers: Sit to/from Stand Sit to Stand: Min assist, From elevated surface           General transfer comment: While standing at EOB, pt was able to complete lateral side step to the right towards [facility] utilizing RW prior to sitting back down on EOB. VC and tactile cues provided for hand placement during RW management.      Balance Overall balance assessment: Mild deficits observed, not formally tested         ADL either performed or assessed with clinical judgement   ADL   Eating/Feeding: Set up;Bed level   Grooming: Wash/dry face;Set up;Bed level           Upper Body Dressing : Minimal assistance;Cueing for sequencing;Sitting Upper Body Dressing Details (indicate cue type and reason): sitting EOB             Cognition Arousal: Alert Behavior During Therapy: Flat affect Overall Cognitive Status: No family/caregiver present to determine baseline cognitive functioning   General Comments: follows directions although needed increased time to respond at times.              General Comments Nursing providing pt pills during session. Pt reports that pill  felt stuck in his throat and requested additional water. Pt drank thin liquid. Became sick and vomited water. Pill did not appear to have been vomited. Pt reports that he did not feel that it was still stuck.    Pertinent Vitals/ Pain       Pain Assessment Pain Assessment: No/denies pain         Frequency  Min 1X/week         Progress Toward Goals  OT Goals(current goals can now be found in the care plan section)  Progress towards OT goals: Progressing toward goals     Plan Discharge plan remains appropriate;Frequency remains appropriate       AM-PAC OT "6 Clicks" Daily Activity     Outcome Measure   Help from another person eating meals?: A Little Help from another person taking care of personal grooming?: A Little Help from another person toileting, which includes using toliet, bedpan, or urinal?: A Lot Help from another person bathing (including washing, rinsing, drying)?: A Little Help from another person to put on and taking off regular upper body clothing?: A Little Help from another person to put on and taking off regular lower body clothing?: A Little 6 Click Score: 17    End of Session Equipment Utilized During Treatment: Gait belt;Rolling walker (2 wheels)  OT Visit Diagnosis: Unsteadiness on feet (R26.81);Other abnormalities of gait and mobility (R26.89);Muscle weakness (generalized) (M62.81);Other symptoms and signs involving cognitive function;Dizziness and giddiness (R42)   Activity Tolerance Patient tolerated treatment well   Patient Left in bed;with call bell/phone within reach;with bed alarm set   Nurse Communication Mobility status;Other (comment) (nursing present through majority of session)        Time: [phone] OT Time Calculation (min): 21 min  Charges: OT General Charges $OT Visit: 1 Visit OT Treatments $Self Care/Home Management : 8-22 mins  Limmie Patricia, OTR/L,CBIS  Supplemental OT - [facility] and [facility] Secure Chat Preferred    , Charisse March 12/14/2022, 1:43 PM

## 2022-12-14 NOTE — Progress Notes (Signed)
PT Cancellation Note  Patient Details Name: Scott Bass MRN: [id number] DOB: 08-May-1951   Cancelled Treatment:    Reason Eval/Treat Not Completed: Fatigue/lethargy limiting ability to participate (pt sleeping soundly upon my arrival, he stated he's too tired to do PT at the moment. Will follow.)   Tamala Ser PT 12/14/2022  Acute Rehabilitation Services  Office [phone]

## 2022-12-14 NOTE — Progress Notes (Signed)
IP PROGRESS NOTE  Subjective:   Scott Bass appears unchanged.  He denies pain.  He is moving all extremities.  Objective: Vital signs in last 24 hours: Blood pressure (!) 144/74, pulse (!) 102, temperature (!) 97.5 F (36.4 C), temperature source Oral, resp. rate 20, height 5\' 6"  (1.676 m), weight 142 lb 13.7 oz (64.8 kg), SpO2 99%.  Intake/Output from previous day: 08/07 0701 - 08/08 0700 In: 240 [P.O.:240] Out: 825 [Urine:825]  Physical Exam: HEENT: No thrush, mild white coat over the tongue Lungs: Clear bilaterally, no respiratory distress Cardiac: Regular rate and rhythm Abdomen: Soft, no hepatosplenomegaly, nontender Vascular: No leg edema Neurologic: Alert, moves all extremities to command    Lab Results: Recent Labs    12/13/22 0956 12/14/22 0620  WBC 6.1 6.1  HGB 9.0* 7.9*  HCT 30.4* 26.4*  PLT 132* 129*    BMET Recent Labs    12/13/22 0547 12/14/22 0620  NA 141 143  K 3.8 3.6  CL 108 112*  CO2 22 19*  GLUCOSE 124* 101*  BUN 52* 42*  CREATININE 2.45* 2.29*  CALCIUM 7.5* 7.9*     Medications: I have reviewed the patient's current medications.  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  Acute/chronic renal failure 3.  Severe anemia 4.  Mild thrombocytopenia 5.  Diabetes 6.  Hypertension 7.  Glaucoma 8.  OSA  Scott Bass appears stable.  He will complete a final dose of pulsed Decadron today.  He is scheduled to receive day 8 Velcade chemotherapy today.  He tolerated the first  treatment with Velcade well.  The CBC is adequate to proceed with day 8 Velcade today.  Scott Bass is scheduled for transfer to the inpatient rehabilitation service to complete physical therapy.  He plans to return home.  I discussed the case with Dr. Candise Che this morning.  He will assume the oncology care of Scott Bass beginning tomorrow.  He will consider adding cyclophosphamide, lenalidomide, and daratumumab to the treatment regimen.  Complete the second 4-day course of pulsed Decadron today, day 8 Velcade today Continue follow-up of the, he may require IV fluids with a negative fluid balance over the past several days Fluconazole for oral candidiasis Out of bed, physical therapy Dr. Candise Che will follow him from medical oncology beginning 12/15/2022      LOS: 10 days   Thornton Papas, MD   12/14/2022, 9:41 AM

## 2022-12-14 NOTE — Progress Notes (Signed)
Bortezomib to be given today as inpatient, RN aware.  Ok to proceed with treatment today with today's labs.  T.O. Dr Mickle Asper, PharmD

## 2022-12-14 NOTE — Plan of Care (Signed)
  Problem: Education: Goal: Knowledge of General Education information will improve Description: Including pain rating scale, medication(s)/side effects and non-pharmacologic comfort measures 12/14/2022 2008 by Ronnald Collum, RN Outcome: Progressing 12/14/2022 2008 by Ronnald Collum, RN Outcome: Progressing   Problem: Health Behavior/Discharge Planning: Goal: Ability to manage health-related needs will improve Outcome: Progressing   Problem: Clinical Measurements: Goal: Ability to maintain clinical measurements within normal limits will improve Outcome: Progressing Goal: Will remain free from infection Outcome: Progressing Goal: Diagnostic test results will improve Outcome: Progressing Goal: Respiratory complications will improve Outcome: Progressing Goal: Cardiovascular complication will be avoided Outcome: Progressing   Problem: Activity: Goal: Risk for activity intolerance will decrease Outcome: Progressing   Problem: Nutrition: Goal: Adequate nutrition will be maintained Outcome: Progressing   Problem: Coping: Goal: Level of anxiety will decrease Outcome: Progressing   Problem: Elimination: Goal: Will not experience complications related to bowel motility Outcome: Progressing Goal: Will not experience complications related to urinary retention Outcome: Progressing   Problem: Pain Managment: Goal: General experience of comfort will improve Outcome: Progressing   Problem: Safety: Goal: Ability to remain free from injury will improve Outcome: Progressing   Problem: Skin Integrity: Goal: Risk for impaired skin integrity will decrease Outcome: Progressing   Problem: Education: Goal: Ability to describe self-care measures that may prevent or decrease complications (Diabetes Survival Skills Education) will improve Outcome: Progressing Goal: Individualized Educational Video(s) Outcome: Progressing   Problem: Coping: Goal: Ability to adjust to condition or  change in health will improve Outcome: Progressing   Problem: Fluid Volume: Goal: Ability to maintain a balanced intake and output will improve Outcome: Progressing   Problem: Health Behavior/Discharge Planning: Goal: Ability to identify and utilize available resources and services will improve Outcome: Progressing Goal: Ability to manage health-related needs will improve Outcome: Progressing   Problem: Metabolic: Goal: Ability to maintain appropriate glucose levels will improve Outcome: Progressing   Problem: Nutritional: Goal: Maintenance of adequate nutrition will improve Outcome: Progressing Goal: Progress toward achieving an optimal weight will improve Outcome: Progressing   Problem: Skin Integrity: Goal: Risk for impaired skin integrity will decrease Outcome: Progressing   Problem: Tissue Perfusion: Goal: Adequacy of tissue perfusion will improve Outcome: Progressing   Problem: Safety: Goal: Non-violent Restraint(s) Outcome: Progressing

## 2022-12-14 NOTE — Plan of Care (Signed)

## 2022-12-14 NOTE — Progress Notes (Signed)
Nutrition Follow-up  DOCUMENTATION CODES:   Non-severe (moderate) malnutrition in context of chronic illness  INTERVENTION:   - Magic cup BID with meals, each supplement provides 290 kcal and 9 grams of protein - Mighty Shake BID with meals, each supplement provides 330 kcals and 9 grams of protein - Encourage intake at all meals and of supplements as tolerated.  NUTRITION DIAGNOSIS:   Moderate Malnutrition related to chronic illness as evidenced by mild fat depletion, mild muscle depletion, percent weight loss (28% in 3 months).  Ongoing.  GOAL:   Patient will meet greater than or equal to 90% of their needs  Not met.  MONITOR:   PO intake, Supplement acceptance, Diet advancement, Weight trends  ASSESSMENT:   72 y.o. wheelchair-bound male with PMH significant for blindness, recently diagnosed multiple myeloma in May 2024 but has not started therapy yet, HTN, diabetes who presented s/p fall and found to be confused, tachycardic and constipated with labs significant for AKI with hyperkalemia and severe metabolic acidosis.  Admitted for acute on chronic renal failure.  Patient currently consuming 50-100% of meals. Pt has had a decrease in his appetite.  SLP evaluated 8/7, recommended for nectar thick liquids to continue with dysphagia 2 diet.   Admission weight: 152 lbs Current weight: 142 lbs  Medications: Ferrous sulfate, Folic acid, Miralax, Senokot  Labs reviewed:  CBGs: 84-141 Low folate  Diet Order:   Diet Order             DIET DYS 2 Room service appropriate? Yes; Fluid consistency: Nectar Thick  Diet effective now                   EDUCATION NEEDS:   Education needs have been addressed  Skin:  Skin Assessment: Reviewed RN Assessment  Last BM:  8/7  Height:   Ht Readings from Last 1 Encounters:  12/04/22 5\' 6"  (1.676 m)    Weight:   Wt Readings from Last 1 Encounters:  12/14/22 64.8 kg    BMI:  Body mass index is 23.06  kg/m.  Estimated Nutritional Needs:   Kcal:  1950-2100 kcals  Protein:  70-85 grams  Fluid:  >/= 1.9L   Tilda Franco, MS, RD, LDN Inpatient Clinical Dietitian Contact information available via Amion

## 2022-12-14 NOTE — Progress Notes (Signed)
[facility] Progress Note  Patient: Scott Bass    WUJ:[id number]  DOA: 12/04/2022     Date of Service: the patient was seen and examined on 12/14/2022  Chief Complaint  Patient presents with   Fall    Pt fell at home on carpet while walking with walkers, pt CBG per EMS 318 , tachy 140. Pt visionally impaired , vision in left eye only, wife called, also vision impaired, dementia , pt on metformin, and ASA. Pt denies LOC or hitting head    Brief hospital course: This 72 years old wheelchair-bound male with PMH significant for blindness, recently diagnosed multiple myeloma in May 2024 but has not started therapy yet, hypertension, diabetes mellitus presented s/p fall and found to be confused, tachycardic and constipated with labs significant for AKI with hyperkalemia and severe metabolic acidosis.  He was also found to have hemoglobin 3.5, lactic acid 6.0.  Patient was initially admitted in the ICU and started on broad-spectrum antibiotics and fluid resuscitation.  Patient has received 3 units of packed red blood cells.  hemoglobin 7.5.  TRH pickup 12/06/2022.    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  Not a candidate for hemodialysis given advanced malignancy, comorbidity and functional status. Serum creatinine improving 11.76 > > >2.29 IV fluids discontinued, serum creatinine continues to improve. Good urine output, Foley was removed 8/3.   Multiple myeloma with diffuse pathological fractures Mild thrombocytopenia: Oncology is consulted recommended Decadron and Velcade. Palliative consulted to discuss goals of care. Velcade dose given on 8/8 and then plan is to start weekly dosing as per oncologist   Hyperkalemia: Resolved Resolved with calcium gluconate, Lasix, insulin and D50. Now hypokalemia > being replaced. Continue to monitor Potassium  3.8, within  normal range now  Hyponatremia: resolved with IV hydration. Now hypernatremia. IVF discontinued, Continue to monitor. Sodium 141, within normal range now  Metabolic acidosis: s/p sodium bicarbonate infusion. Started oral bicarbonate Check BMP daily  Lactic acidosis > Improved. IVF discontinued.  Lactic acid improving 6.0 > 2.7   Acute anemia likely secondary to multiple myeloma: S/p 2 unit PRBC.  Hemoglobin 7.4 >8.2 > 9.0 8/8 Hb 7.9 Keep hemoglobin above 7.   Tachycardia: Likely multifactorial due to anemia, dehydration, suspected sepsis. One of the 4 blood culture showed Staphylococcus Capitis.  Likely contaminant   Large mass involving T4 vertebral body with soft tissue component: MRI with and without C and T-spine when renal functions improves.   Acute metabolic encephalopathy Likely secondary to multiple myeloma, uremia and others. Minimize sedation,  reorient as above. Continue Haldol as needed for agitation and restlessness. Psychiatry consulted for agitation, restlessness.    Constipation: Aggressive bowel regimen.   Hypothyroidism: Continue Synthroid. Glaucoma, continue eyedrops.  Right eye blind, very poor vision.  Patient can see through left eye  Folic acid deficiency, started folate supplement   Body mass index is 23.06 kg/m.  Nutrition Problem: Moderate Malnutrition Etiology: chronic illness Interventions: Interventions: Refer to RD note for recommendations, Magic cup, Hormel Shake   Diet: Dysphagia 2 diet DVT Prophylaxis: SCD, pharmacological prophylaxis contraindicated due to symptomatic anemia    Advance goals of care discussion: DNR  Family Communication: family was not present at bedside, at the time of interview.  The pt provided permission to discuss medical plan with the family. Opportunity was given to ask question and all questions were answered satisfactorily.   Disposition:  Pt is from Home, admitted with acute renal failure, multiple  myeloma and anemia.  Clinically improving, medically optimized, need to start Velcade for multiple myeloma.  Stable to discharge to CIR tomorrow a.m. TOC following for discharge planning   Subjective: No significant events overnight, patient was sitting in the recliner, denies any complaints.   Physical Exam: General: NAD, lying comfortably Appear in no distress, affect appropriate Eyes: PERRLA, right eye blindness, patient cannot see through left eye ENT: Oral Mucosa Clear, moist  Neck: no JVD,  Cardiovascular: S1 and S2 Present, no Murmur,  Respiratory: good respiratory effort, Bilateral Air entry equal and Decreased, no Crackles, no wheezes Abdomen: Bowel Sound present, Soft and no tenderness,  Skin: no rashes Extremities: no Pedal edema, no calf tenderness Neurologic: without any new focal findings Gait not checked due to patient safety concerns  Vitals:   12/14/22 0611 12/14/22 0700 12/14/22 1012 12/14/22 1326  BP: (!) 144/74  (!) 140/89 119/72  Pulse: (!) 102   98  Resp: 20   16  Temp: (!) 97.5 F (36.4 C)   97.8 F (36.6 C)  TempSrc: Oral     SpO2: 99%   100%  Weight:  64.8 kg    Height:        Intake/Output Summary (Last 24 hours) at 12/14/2022 1741 Last data filed at 12/14/2022 1735 Gross per 24 hour  Intake 477 ml  Output 1425 ml  Net -948 ml   Filed Weights   12/12/22 0500 12/13/22 0402 12/14/22 0700  Weight: 70.8 kg 65.2 kg 64.8 kg    Data Reviewed: I have personally reviewed and interpreted daily labs, tele strips, imagings as discussed above. I reviewed all nursing notes, pharmacy notes, vitals, pertinent old records I have discussed plan of care as described above with RN and patient/family.  CBC: Recent Labs  Lab 12/08/22 0321 12/09/22 0323 12/10/22 1043 12/13/22 0956 12/14/22 0620  WBC  --  6.5 4.3 6.1 6.1  NEUTROABS  --   --   --  4.7 4.7  HGB 7.8* 8.1* 8.5* 9.0* 7.9*  HCT 24.8* 26.3* 28.1* 30.4* 26.4*  MCV  --  93.9 95.6 97.4 97.1  PLT   --  115* 105* 132* 129*   Basic Metabolic Panel: Recent Labs  Lab 12/09/22 0323 12/10/22 1043 12/11/22 0614 12/12/22 0310 12/13/22 0547 12/14/22 0620  NA 141  142 142 146* 146* 141 143  K 3.6  3.6 3.4* 3.3* 3.1* 3.8 3.6  CL 106  107 108 113* 111 108 112*  CO2 22  22 22  21* 23 22 19*  GLUCOSE 168*  169* 206* 143* 139* 124* 101*  BUN 99*  99* 85* 69* 59* 52* 42*  CREATININE 4.01*  4.09* 3.00* 2.59* 2.34* 2.45* 2.29*  CALCIUM 7.2*  7.1* 6.8* 7.1* 7.2* 7.5* 7.9*  MG 2.8*  --  2.0 1.9  --  1.8  PHOS 4.0  4.0  --  2.3* 3.4  --  2.8    Studies: No results found.  Scheduled Meds:  amLODipine  5 mg Oral Daily   brimonidine  1 drop Both Eyes TID   dexamethasone  20 mg Oral Daily   ferrous sulfate  325 mg Oral QODAY   fluconazole  100 mg Oral Daily   folic acid  1 mg Oral Daily   insulin aspart  0-15 Units Subcutaneous Q4H   latanoprost  1 drop Both Eyes QHS   levothyroxine  150 mcg Oral Q0600   pantoprazole  40 mg Oral BID   polyethylene glycol  17 g Oral Daily   sennosides  15 mL Oral BID   sodium bicarbonate  650 mg Oral TID   tamsulosin  0.4 mg Oral Daily   Continuous Infusions:  famotidine (PEPCID) IV (ONCOLOGY)     PRN Meds: albuterol, diphenhydrAMINE, docusate sodium, EPINEPHrine, famotidine (PEPCID) IV (ONCOLOGY), haloperidol lactate, hydrALAZINE, HYDROmorphone (DILAUDID) injection, methylPREDNISolone sodium succinate, ondansetron (ZOFRAN) IV, ondansetron, mouth rinse, phenol, polyethylene glycol, simethicone  Time spent: 35 minutes  Author: Gillis Santa. MD [facility] 12/14/2022 5:41 PM  To reach On-call, see care teams to locate the attending and reach out to them via [email or web address]. If 7PM-7AM, please contact night-coverage If you still have difficulty reaching the attending provider, please page the [facility] (Director on Call) for [facility] on amion for assistance.

## 2022-12-15 ENCOUNTER — Encounter: Payer: Self-pay | Admitting: Hematology

## 2022-12-15 DIAGNOSIS — Z515 Encounter for palliative care: Secondary | ICD-10-CM | POA: Diagnosis not present

## 2022-12-15 DIAGNOSIS — N170 Acute kidney failure with tubular necrosis: Secondary | ICD-10-CM | POA: Diagnosis not present

## 2022-12-15 DIAGNOSIS — D6189 Other specified aplastic anemias and other bone marrow failure syndromes: Secondary | ICD-10-CM

## 2022-12-15 DIAGNOSIS — C9000 Multiple myeloma not having achieved remission: Secondary | ICD-10-CM | POA: Diagnosis not present

## 2022-12-15 DIAGNOSIS — E440 Moderate protein-calorie malnutrition: Secondary | ICD-10-CM | POA: Diagnosis not present

## 2022-12-15 DIAGNOSIS — Z7189 Other specified counseling: Secondary | ICD-10-CM | POA: Diagnosis not present

## 2022-12-15 DIAGNOSIS — R410 Disorientation, unspecified: Secondary | ICD-10-CM | POA: Diagnosis not present

## 2022-12-15 DIAGNOSIS — E872 Acidosis, unspecified: Secondary | ICD-10-CM | POA: Diagnosis not present

## 2022-12-15 LAB — GLUCOSE, CAPILLARY
Glucose-Capillary: 118 mg/dL — ABNORMAL HIGH (ref 70–99)
Glucose-Capillary: 127 mg/dL — ABNORMAL HIGH (ref 70–99)
Glucose-Capillary: 152 mg/dL — ABNORMAL HIGH (ref 70–99)
Glucose-Capillary: 224 mg/dL — ABNORMAL HIGH (ref 70–99)
Glucose-Capillary: 231 mg/dL — ABNORMAL HIGH (ref 70–99)
Glucose-Capillary: 241 mg/dL — ABNORMAL HIGH (ref 70–99)

## 2022-12-15 MED ORDER — MAGNESIUM SULFATE 2 GM/50ML IV SOLN
2.0000 g | Freq: Once | INTRAVENOUS | Status: AC
  Administered 2022-12-15: 2 g via INTRAVENOUS
  Filled 2022-12-15: qty 50

## 2022-12-15 NOTE — Progress Notes (Signed)
Speech Language Pathology Treatment: Dysphagia  Patient Details Name: Scott Bass MRN: [id number] DOB: 1950-06-26 Today's Date: 12/15/2022 Time: 6295-2841 SLP Time Calculation (min) (ACUTE ONLY): 40 min  Assessment / Plan / Recommendation Clinical Impression  Patient seen for skilled dysphagia management.  Wife present during session and reports patient coughing with thin water/liquids only since this admission.  Patient with decreased cooperation during session perseverating on wanting to go home.    His intake is poor but he declines this being related to his diet.  Wife reports poor intake at home stating he would only eat a few bites and then declined more.  When SLP inquired if current diet impacting his intake, he denied this and again stated he just wants to go home.   Patient did accept his medications crushed with applesauce from RN and nectar thick juice was used to orally transit.  No coughing or indication of dysphagia with this administration.    Mrs. Peloso reports observing during this session that patient is overtly coughing with thin water and is managing taking his pills with applesauce and nectar thick liquids without any deficits.  SLP explained plan including providing nectar thick liquids during meals and with medications and thin water only between meals and without any other intake.  Explained purpose of water protocol to help maximize hydration as safely as possible.   Wife and were provided options of considering conducting MBS study to assure he is on the least restrictive diet, she and patient advised that they would like to continue current care plan of nectar with meals and thin water between.  Pt admits to discomfort when he is coughing with water, thus current plan appears to provide him with the most comfort..  Using teach back with minimal cues explained clinical reasoning for current plan.  Provided patient and wife with ice water via cups and large pitcher of  ice water.  Inquired if pt would consume more p.o. if SLP advanced diet to which he said "no" and he just wants to go home.  Suspect this poor intake is baseline per wife reporting he would only take a few bites of something at home and stop eating.    However patient's articulation is much better and thus will advance diet to soft in hopes to maximize p.o.  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  Following session spoke with hospitalist who is agreement with current plan.  Note palliative referral pending and if goals of care become comfort would recommend liberate diet, however patient's intake will continue to be poor.      HPI HPI: Briefly, 72 year old wheel chair bound male with blindness, recently diagnosed multiple myeloma in May 2024 but not has started therapy, HTN, DM who presented for fall and found confused, tachycardic and constipated with labs significant for AKI with hyperkalemia and severe metabolic acidosis and Hg 3.5. LA 6.0. PCCM consulted for admission. In the ED started on broad spectrum abx and fluid resuscitation; CT chest indicated No acute findings within the chest, abdomen or pelvis. No  evidence of solid organ injury. No hemorrhage or edema within the  mediastinum. No pleural effusion or pneumothorax ; ST consulted for swallow evaluation d/t dysphagia noted with liquids per nursing report.      SLP  Plan  Continue with current plan of care      Recommendations for follow up therapy are one component of a multi-disciplinary discharge planning process, led by the attending physician.  Recommendations may be updated based on patient status, additional functional criteria and insurance authorization.    Recommendations  Diet recommendations: Dysphagia 3 (mechanical soft);Nectar-thick liquid  (Free water between meals after mouth care) Liquids provided via: Cup;Straw Medication Administration: Crushed with puree (Can follow-up with nectar to help transit) Supervision: Full supervision/cueing for compensatory strategies Compensations: Slow rate;Small sips/bites Postural Changes and/or Swallow Maneuvers: Seated upright 90 degrees;Upright 30-60 min after meal                  Oral care BID;Staff/trained caregiver to provide oral care   Frequent or constant Supervision/Assistance Dysphagia, oropharyngeal phase (R13.12)     Continue with current plan of care   Rolena Infante, MS [facility] SLP Acute Rehab Services Office [phone]   Chales Abrahams  12/15/2022, 10:51 AM

## 2022-12-15 NOTE — Progress Notes (Signed)
Inpatient Rehab Admissions Coordinator:  Notified that pt has a palliative consult pending. Will continue to follow to determine if CIR is still appropriate.   Wolfgang Phoenix, MS, CCC-SLP Admissions Coordinator [phone]

## 2022-12-15 NOTE — Progress Notes (Signed)
Physical Therapy Treatment Patient Details Name: Scott Bass MRN: [id number] DOB: 06-09-1950 Today's Date: 12/15/2022   History of Present Illness 72 year old male presented for fall and found confused, tachycardic and constipated with labs significant for AKI with hyperkalemia and severe metabolic acidosis and Hg 3.5. LA 6.0;  CTs 12/04/2022-innumerable lytic lesions, large expansile lesion in the anterior right second rib, multiple pathologic rib fractures, expansile T4 (T3 on CT cervical spine) mass with soft tissue component extending into the central canal and left neural foramen, C7 transverse process fracture, displaced fracture of the lower RIGHT scapula, of uncertain age. PHMx: blindness (can see some, but not details), recently diagnosed multiple myeloma in May 2024 but has not started therapy (diffuse lesions) , HTN, DM    PT Comments   Pt admitted with above diagnosis.  Pt currently with functional limitations due to the deficits listed below (see PT Problem List). Pt in bed resting when PT arrived. Pt agreeable to therapy session at first and once pt engaged with bed mobility pt indicated he really did not want to do anything because he was tired. PT encouraged pt to sit EOB requiring min A, lateral scoot toward R to [facility] for improved positioning in bed and S for sit to supine, pt able to Bass in bed for repositioning and bed pad placement x 3. Pt left in bed, all needs in place. Pt d/c plan remains appropriate with both pt and spouse wanting to continue therapy services at time of d/c either CIR or SNF. Pt will benefit from acute skilled PT to increase their independence and safety with mobility to allow discharge.      If plan is discharge home, recommend the following: A little help with walking and/or transfers;A little help with bathing/dressing/bathroom;Assistance with cooking/housework;Assist for transportation;Help with stairs or ramp for entrance   Can travel by private vehicle         Equipment Recommendations  None recommended by PT    Recommendations for Other Services       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); wife blind also Restrictions Weight Bearing Restrictions: No     Mobility  Bed Mobility Overal bed mobility: Needs Assistance Bed Mobility: Supine to Sit, Sit to Supine     Supine to sit: HOB elevated, Mod assist, Min assist, Used rails (hand over hand cues to reach and locate bed rail to increased IND with supine to sit) Sit to supine: Supervision   General bed mobility comments: provided tactile cues and VC on direction and location, min A for lateral scoot to R toward HOB.    Transfers                   General transfer comment: pt declined to get OOB or perform sit to stand from EOB stating he was too tired    Ambulation/Gait               General Gait Details: pt declined ambulation today   Stairs             Wheelchair Mobility     Tilt Bed    Modified Rankin (Stroke Patients Only)       Balance Overall balance assessment: Mild deficits observed, not formally tested Sitting-balance support: No upper extremity supported, Feet supported Sitting balance-Leahy Scale: Good  Cognition Arousal: Alert Behavior During Therapy: Flat affect Overall Cognitive Status: No family/caregiver present to determine baseline cognitive functioning                                 General Comments: follows directions although needed increased time to respond at times.        Exercises      General Comments        Pertinent Vitals/Pain Pain Assessment Pain Assessment: No/denies pain    Home Living Family/patient expects to be discharged to:: Private residence Living Arrangements: Spouse/significant other Available Help at Discharge: Family;Available 24  hours/day Type of Home: House Home Access: Level entry       Home Layout: One level Home Equipment: Agricultural consultant (2 wheels);Cane - quad      Prior Function            PT Goals (current goals can now be found in the care plan section) Acute Rehab PT Goals Patient Stated Goal: go home PT Goal Formulation: With patient/family Time For Goal Achievement: 12/22/22 Potential to Achieve Goals: Good    Frequency    Min 1X/week      PT Plan      Co-evaluation              AM-PAC PT "6 Clicks" Mobility   Outcome Measure  Help needed turning from your back to your side while in a flat bed without using bedrails?: A Little Help needed moving from lying on your back to sitting on the side of a flat bed without using bedrails?: A Little Help needed moving to and from a bed to a chair (including a wheelchair)?: A Lot Help needed standing up from a chair using your arms (e.g., wheelchair or bedside chair)?: A Lot Help needed to walk in hospital room?: Total Help needed climbing 3-5 steps with a railing? : Total 6 Click Score: 12    End of Session Equipment Utilized During Treatment: Gait belt Activity Tolerance: Patient limited by fatigue Patient left: in bed;with call bell/phone within reach;with bed alarm set Nurse Communication: Mobility status PT Visit Diagnosis: Unsteadiness on feet (R26.81);Other abnormalities of gait and mobility (R26.89);Difficulty in walking, not elsewhere classified (R26.2)     Time: 4403-4742 PT Time Calculation (min) (ACUTE ONLY): 16 min  Charges:    $Therapeutic Activity: 8-22 mins PT General Charges $$ ACUTE PT VISIT: 1 Visit                     Scott Bass, PT Acute Rehab    [name] 12/15/2022, 6:15 PM

## 2022-12-15 NOTE — Progress Notes (Signed)
Daily Progress Note   Patient Name: Scott Bass       Date: 12/15/2022 DOB: April 02, 1951  Age: 73 y.o. MRN#: [id number] Attending Physician: Scott Laughter, DO Primary Care Physician: Scott Harries, MD Admit Date: 12/04/2022 Length of Stay: 11 days  Reason for Consultation/Follow-up: Establishing goals of care  Subjective:   CC: Patient sitting up in bed comfortably this morning.  Patient's wife Scott Bass also at bedside.   Following up regarding complex medical decision making.   Subjective:  In interdisciplinary manner, patient's case was discussed with the hospital medicine, medical oncology and CIR colleagues.  Patient reportedly stated that he would want to go home and does not want to be kept in the hospital and that this is against his wishes. Palliative care was reconsulted for ongoing goals of care discussions. Presented to bedside, wife Scott Bass also present in the room.  Asked the patient about his understanding of his current condition and what his goals and wishes were.  Patient states that that he would like to go home for a little bit of for having some quality time at home.  Subsequently, he wants to come back to the hospital and wants to continue with cancer directed treatments.  His wife Scott Bass states that she would rather have a him go to physical therapy either at [facility] rehab or at [facility] and continue with cancer directed care.  At this time, neither the patient's nor the wife's goals are for comfort-focused care.  Additionally, I also gave them some information about hospice services and specifically what home with hospice care will look like.   All questions answered at that time.  Thank patient and his wife for allowing me to visit today.  Review of Systems No symptom concerns stated during visit Objective:   Vital Signs:  BP 126/67 (BP Location: Left Arm)   Pulse (!) 103   Temp 98 F (36.7 C)   Resp 18   Ht 5\' 6"  (1.676 m)   Wt 64.8 kg   SpO2 100%   BMI 23.06 kg/m    Physical Exam: General: NAD,laying in bed, chronically ill appearing  Eyes: No drainage noted HENT: Dry mucous membranes Cardiovascular: RRR Respiratory: no increased work of breathing noted, not in respiratory distress Abdomen: not distended Neuro: calm, awake, interactive though slow to respond at times  Imaging:  I personally reviewed recent imaging.   Assessment & Plan:   Assessment: Patient is a 72 year old male with a past medical history of wheelchair-bound status, legally blind, CKD, GERD, OSA, hypertension, diabetes mellitus, and recent diagnosis of multiple myeloma who was admitted on 12/04/2022 after a fall at home.  Upon admission, patient was found to have AKI on CKD, encephalopathy, and multiple electrolyte imbalances.  Patient seen by oncology and nephrology during hospitalization.  Patient was supposed to follow-up with oncology on 6/12 though missed this appointment and so did not begin therapies for his multiple myeloma.  Palliative medicine team consulted to assist with complex medical decision making   Recommendations/Plan: # Complex medical decision making/goals of care:  - Patient and wife continuing  open conversations with providers. Patient started on chemotherapy as per oncology. Patient being evaluated for potential CIR admission.  Patient expressed a wish to go home.  We rediscussed goals of care.  At the time of today's visit, I compared and contrasted to different modes of care.  One mode of care is continuing with cancer directed treatments, pursuing SNF rehab or CIR and pursuing oncology care  at [facility].  We compared and contrasted this to a comfort-focused care that would involve  addition of hospice services in the patient's home setting.  Neither the patient nor his wife were at this point in time  have the mindset of comfort care.  They are not on board with hospice philosophy of care for right now.  Patient now states that that he is not  opposed to considering CIR.  Will alert different members of the interdisciplinary team and palliative will continue to follow along.  -  Code Status: DNR  # Symptom management:  Non-pharmacologic Delirium Precautions  Patient's delirium slowly improving with improvement of medical concerns, particularly improved of kidney function.   - Frequent re-orientation; update board w/ date, names of treatment team  - Daytime stimulation: Open curtains, turn lights on, and turn on television as tolerated during waking hours  - Nighttime calm - close curtains, turn lights off, and turn off television at 9pm  with minimal interruptions from treatment team during sleeping hours, including avoiding lab draws if possible  - Encourage continued presence of family/friends  - Occupy w/ distractions - e.g. ask them to fold washcloths, busy-board  - Encourage movement with PT/OT as tolerated  - Ensure adequate sensorium, to include providing glasses, dentures, and hearing aids to patient as appropriate  - Ensure adequate nutrition and fluid intake  - Assess and treat pain (incl nonverbal cues); e.g. consider scheduled tylenol  - Assess and treat constipation and urinary retention  - Ensure hydration, electrolyte balance (K to 4.0, Mg to 2.0), adequate oxygenation  - Review medications (addition, deletion, changes can trigger)   # Psychosocial Support:  -Wife (who is also blind)  # Discharge Planning: Likely rehab- SNF vs CIR  -Likely will need home palliative medicine referral vs [facility] PMT follow up at time of discharge.   Discussed with: patient, patient's wife, IDT  Thank you for allowing the palliative care team to participate in the care Scott Bass.  High MDM Scott Hawking MD Palliative Care Provider PMT # [phone]  If patient remains symptomatic despite maximum doses, please call PMT at [phone] between 0700 and 1900. Outside of these hours, please call attending, as PMT does not have  night coverage.  *Please note that this is a verbal dictation therefore any spelling or grammatical errors are due to the "[employer]agon [employer] One" system interpretation.

## 2022-12-15 NOTE — Progress Notes (Signed)
PROGRESS NOTE    Scott Bass  HQI:[id number] DOB: 07-01-1950 DOA: 12/04/2022 PCP: Tracey Harries, MD   Brief Narrative:  This 72 years old wheelchair-bound male with PMH significant for blindness, recently diagnosed multiple myeloma in May 2024 but has not started therapy yet, hypertension, diabetes mellitus presented s/p fall and found to be confused, tachycardic and constipated with labs significant for AKI with hyperkalemia and severe metabolic acidosis. He was also found to have hemoglobin 3.5, lactic acid 6.0. Patient was initially admitted in the ICU and started on broad-spectrum antibiotics and fluid resuscitation. Patient has received 3 units of packed red blood cells. hemoglobin 7.5. TRH pickup 12/06/2022.   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  Not a candidate for hemodialysis given advanced malignancy, comorbidity and functional status. -Avoid Nephrotoxic Medications, Contrast Dyes, Hypotension and Dehydration to Ensure Adequate Renal Perfusion and will need to Renally Adjust Meds -Continue to Monitor and Trend Renal Function carefully and repeat CMP in the AM  -IV fluids discontinued, serum creatinine continues to improve. -Good urine output, Foley was removed 8/3.   Multiple myeloma with diffuse pathological fractures Mild thrombocytopenia: -Oncology is consulted recommended Decadron and Velcade. -Palliative consulted to discuss goals of care and he wants to go home but after further discussion with his wife he is going to go to CIR and continue chemotherapy. -Velcade dose given on 8/8 and then plan is to start  weekly dosing as per oncologist -PT/OT recommending CIR -Palliative care consulted and patient had expressed that he wanted to go home with goals of care readdressed and now the patient wants to continue cancer directed treatments and pursue rehabilitation and he does not want to be comfort care and they are not on board with the hospice philosophy is for now    Hyperkalemia: Resolved Resolved with calcium gluconate, Lasix, insulin and D50. Now hypokalemia > being replaced. Continue to monitor -K+ Trend: Recent Labs  Lab 12/09/22 0323 12/10/22 1043 12/11/22 0614 12/12/22 0310 12/13/22 0547 12/14/22 0620 12/15/22 0522  K 3.6  3.6 3.4* 3.3* 3.1* 3.8 3.6 3.9  -Continue to monitor and replete as necessary   Hyponatremia:  -IVF discontinued, Continue to monitor. Na+ Trend: Recent Labs  Lab 12/09/22 0323 12/10/22 1043 12/11/22 0614 12/12/22 0310 12/13/22 0547 12/14/22 0620 12/15/22 0522  NA 141  142 142 146* 146* 141 143 134*    Metabolic Acidosis -s/p sodium bicarbonate infusion. -Started oral bicarbonate -Patient has a CO2 of 19, anion gap of 13, chloride 102 9 To continue monitor and trend and repeat CMP in a.m.   Lactic acidosis > Improved. -IVF discontinued.  -Lactic Acid Level Trend: Recent Labs  Lab 12/04/22 0529 12/04/22 0819  LATICACIDVEN 6.0* 2.7*  -Continue monitor and trend and will not repeat lactic acid   Acute anemia likely secondary to multiple myeloma: -S/p 2 unit PRBC -Hgb/Hct Trend: Recent Labs  Lab 12/07/22 0327 12/08/22 0321 12/09/22 0323 12/10/22 1043 12/13/22 0956 12/14/22 0620 12/15/22 0522  HGB 8.2* 7.8* 8.1* 8.5* 9.0* 7.9* 8.0*  HCT 26.4* 24.8* 26.3* 28.1* 30.4* 26.4* 25.3*  MCV 92.3  --  93.9 95.6 97.4 97.1 93.4  -Keep  hemoglobin above 7.   Tachycardia -Likely multifactorial due to anemia, dehydration, suspected sepsis. -One of the 4 blood culture showed Staphylococcus Capitis.  Likely contaminant   Large mass involving T4  vertebral body with soft tissue component: -MRI with and without C and T-spine when renal functions improves.   Acute metabolic encephalopathy -Likely secondary to multiple myeloma, uremia and others. -Minimize sedation,  reorient as above. -Continue Haldol as needed for agitation and restlessness. -Psychiatry consulted for agitation, restlessness.    Constipation: -Aggressive bowel regimen.   Hypothyroidism -Continue Synthroid.  Glaucoma -continue eyedrops.  Right eye blind, very poor vision.  Patient can see through left eye   Folic acid deficiency, -started folate supplement   Moderate Malnutrition Estimated body mass index is 23.06 kg/m as calculated from the following:   Height as of this encounter: 5\' 6"  (1.676 m).   Weight as of this encounter: 64.8 kg. Nutrition Status: Nutrition Problem: Moderate Malnutrition Etiology: chronic illness Signs/Symptoms: mild fat depletion, mild muscle depletion, percent weight loss (28% in 3 months) Percent weight loss: 28 % (in 3 months) Interventions: Refer to RD note for recommendations, Magic cup, Hormel Shake    DVT prophylaxis: SCDs Start: 12/04/22 0948    Code Status: DNR Family Communication: Discussed with his wife at bedside  Disposition Plan:  Level of care: Telemetry Status is: Inpatient Remains inpatient appropriate because: Needs to go to CIR   Consultants:  Medical oncology Palliative care medicine PCCM transfer CIR Nephrology  Procedures:  As delineated as above  Antimicrobials:  Anti-infectives (From admission, onward)    Start     Dose/Rate Route Frequency Ordered Stop   12/11/22 1000  fluconazole (DIFLUCAN) tablet 100 mg        100 mg Oral Daily 12/11/22 0844 12/16/22 0959   12/05/22 1000  cefTRIAXone (ROCEPHIN) 2 g in sodium chloride 0.9 % 100 mL IVPB        2 g 200 mL/hr over 30 Minutes Intravenous Every 24 hours 12/04/22 1340 12/08/22 1008   12/04/22 0600  ceFEPIme (MAXIPIME) 2 g in sodium  chloride 0.9 % 100 mL IVPB        2 g 200 mL/hr over 30 Minutes Intravenous  Once 12/04/22 0557 12/04/22 0726   12/04/22 0600  metroNIDAZOLE (FLAGYL) IVPB 500 mg        500 mg 100 mL/hr over 60 Minutes Intravenous  Once 12/04/22 0557 12/04/22 0829   12/04/22 0600  vancomycin (VANCOCIN) IVPB 1000 mg/200 mL premix        1,000 mg 200 mL/hr over 60 Minutes Intravenous  Once 12/04/22 0557 12/04/22 0347        Subjective: Seen and examined at bedside and he was doing okay and expressed that he wanted to go home but then is agreeable to going to CIR.  Denies other concerns or complaints at this time.  No nausea or vomiting.  Objective: Vitals:   12/14/22 1950 12/15/22 0406 12/15/22 0754 12/15/22 1318  BP: 128/64 121/68 126/67 132/76  Pulse: 98 100 (!) 103 98  Resp: 15 20 18 18   Temp: 98.1 F (36.7 C) 98.2 F (36.8 C) 98 F (36.7 C) 98.1 F (36.7 C)  TempSrc: Oral     SpO2: 99% 99% 100% 100%  Weight:      Height:        Intake/Output Summary (Last 24 hours) at 12/15/2022 2018 Last data filed at 12/15/2022 1852 Gross per 24 hour  Intake 600 ml  Output 900 ml  Net -300 ml   Filed Weights   12/12/22 0500 12/13/22 0402 12/14/22 0700  Weight: 70.8 kg 65.2 kg 64.8 kg   Examination: Physical Exam:  Constitutional: Chronically ill appearing African-American male who is blind and in no acute distress Respiratory: Diminished to auscultation bilaterally, no wheezing, rales, rhonchi or crackles. Normal respiratory effort and patient is not tachypenic. No accessory muscle use.  Unlabored breathing Cardiovascular: RRR, no murmurs / rubs / gallops. S1 and S2 auscultated.  Trace extra edema Abdomen: Soft, non-tender, mildly distended secondary body habitus. Bowel sounds positive.  GU: Deferred. Musculoskeletal: No clubbing / cyanosis of digits/nails. No joint deformity upper and lower extremities.  Skin: No rashes, lesions, ulcers limited skin evaluation. No induration; Warm and dry.   Neurologic: Patient is blind Psychiatric: He is awake and alert  Data Reviewed: I have personally reviewed following labs and imaging studies  CBC: Recent Labs  Lab 12/09/22 0323 12/10/22 1043 12/13/22 0956 12/14/22 0620 12/15/22 0522  WBC 6.5 4.3 6.1 6.1 8.9  NEUTROABS  --   --  4.7 4.7 8.1*  HGB 8.1* 8.5* 9.0* 7.9* 8.0*  HCT 26.3* 28.1* 30.4* 26.4* 25.3*  MCV 93.9 95.6 97.4 97.1 93.4  PLT 115* 105* 132* 129* 153   Basic Metabolic Panel: Recent Labs  Lab 12/09/22 0323 12/10/22 1043 12/11/22 0614 12/12/22 0310 12/13/22 0547 12/14/22 0620 12/15/22 0522  NA 141  142   < > 146* 146* 141 143 134*  K 3.6  3.6   < > 3.3* 3.1* 3.8 3.6 3.9  CL 106  107   < > 113* 111 108 112* 102  CO2 22  22   < > 21* 23 22 19* 19*  GLUCOSE 168*  169*   < > 143* 139* 124* 101* 129*  BUN 99*  99*   < > 69* 59* 52* 42* 48*  CREATININE 4.01*  4.09*   < > 2.59* 2.34* 2.45* 2.29* 2.42*  CALCIUM 7.2*  7.1*   < > 7.1* 7.2* 7.5* 7.9* 7.5*  MG 2.8*  --  2.0 1.9  --  1.8 1.8  PHOS 4.0  4.0  --  2.3* 3.4  --  2.8 4.3   < > = values in this interval not displayed.   GFR: Estimated Creatinine Clearance: 25.3 mL/min (A) (by C-G formula based on SCr of 2.42 mg/dL (H)). Liver Function Tests: Recent Labs  Lab 12/09/22 0323  ALBUMIN 2.0*   No results for input(s): "LIPASE", "AMYLASE" in the last 168 hours. No results for input(s): "AMMONIA" in the last 168 hours. Coagulation Profile: No results for input(s): "INR", "PROTIME" in the last 168 hours. Cardiac Enzymes: No results for input(s): "CKTOTAL", "CKMB", "CKMBINDEX", "TROPONINI" in the last 168 hours. BNP (last 3 results) No results for input(s): "PROBNP" in the last 8760 hours. HbA1C: No results for input(s): "HGBA1C" in the last 72 hours. CBG: Recent Labs  Lab 12/14/22 2340 12/15/22 0403 12/15/22 0735 12/15/22 1203 12/15/22 1623  GLUCAP 164* 118* 127* 152* 231*   Lipid Profile: No results for input(s): "CHOL", "HDL",  "LDLCALC", "TRIG", "CHOLHDL", "LDLDIRECT" in the last 72 hours. Thyroid Function Tests: No results for input(s): "TSH", "T4TOTAL", "FREET4", "T3FREE", "THYROIDAB" in the last 72 hours. Anemia Panel: Recent Labs    12/13/22 0956  VITAMINB12 7,429*  FOLATE 3.7*   Sepsis Labs: No results for input(s): "PROCALCITON", "LATICACIDVEN" in the last 168 hours.  Recent Results (from the past 240 hour(s))  Culture, blood (Routine X 2) w Reflex to  ID Panel     Status: None   Collection Time: 12/06/22  6:15 AM   Specimen: BLOOD  Result Value Ref Range Status   Specimen Description   Final    BLOOD BLOOD RIGHT ARM Performed at [facility], [address] [place], [place] [address]    Special Requests   Final    BOTTLES DRAWN AEROBIC ONLY Blood Culture adequate volume Performed at [facility], [address], [place], [place] [address]    Culture   Final    NO GROWTH 5 DAYS Performed at [facility], [address] [place], [place] [address]    Report Status 12/11/2022 FINAL  Final  Culture, blood (Routine X 2) w Reflex to ID Panel     Status: None   Collection Time: 12/06/22  6:30 AM   Specimen: BLOOD  Result Value Ref Range Status   Specimen Description   Final    BLOOD BLOOD LEFT FOREARM Performed at [facility], [address] [place], [place] [address]    Special Requests   Final    BOTTLES DRAWN AEROBIC ONLY Blood Culture adequate volume Performed at [facility], [address] [place], [place] [address]    Culture   Final    NO GROWTH 5 DAYS Performed at [facility], [address], [place], [place] [address]    Report Status 12/11/2022 FINAL  Final    Radiology Studies: No results found.  Scheduled Meds:  amLODipine  5 mg Oral Daily   brimonidine  1 drop Both Eyes TID   dexamethasone  20 mg Oral Daily   ferrous sulfate  325 mg Oral QODAY   fluconazole  100 mg Oral Daily    folic acid  1 mg Oral Daily   insulin aspart  0-15 Units Subcutaneous Q4H   latanoprost  1 drop Both Eyes QHS   levothyroxine  150 mcg Oral Q0600   pantoprazole  40 mg Oral BID   polyethylene glycol  17 g Oral Daily   sennosides  15 mL Oral BID   sodium bicarbonate  650 mg Oral TID   tamsulosin  0.4 mg Oral Daily   Continuous Infusions:  famotidine (PEPCID) IV (ONCOLOGY)      LOS: 11 days   Marguerita Merles, DO [facility] Available via Epic secure chat 7am-7pm After these hours, please refer to coverage provider listed on [email or web address] 12/15/2022, 8:18 PM

## 2022-12-15 NOTE — Hospital Course (Addendum)
Scott Bass is a 72 y.o. male with a history of blindness, multiple myeloma not yet on therapy, hypertension, diabetes mellitus.  Patient presented secondary to a fall and was found to have severe acute on chronic renal failure with associated anemia and hemoglobin of 3.5.  Patient required ICU admission on presentation and was started on IV fluids for hydration in addition to receiving a total of 4 units of PRBC to date.  Nephrology was consulted for acute kidney injury concerning for possible complication of untreated known multiple myeloma in addition to hypovolemia/dehydration.  Patient was started empirically on antibiotics for possible infection with blood cultures significant for Staphylococcus capitis and 1 out of 4 bottles consistent with likely contaminant.  Renal function continued to improve during admission.  Patient started on chemotherapy for multiple myeloma while inpatient.

## 2022-12-16 DIAGNOSIS — N170 Acute kidney failure with tubular necrosis: Secondary | ICD-10-CM | POA: Diagnosis not present

## 2022-12-16 DIAGNOSIS — R410 Disorientation, unspecified: Secondary | ICD-10-CM | POA: Diagnosis not present

## 2022-12-16 DIAGNOSIS — E872 Acidosis, unspecified: Secondary | ICD-10-CM | POA: Diagnosis not present

## 2022-12-16 DIAGNOSIS — D649 Anemia, unspecified: Secondary | ICD-10-CM | POA: Insufficient documentation

## 2022-12-16 DIAGNOSIS — C9000 Multiple myeloma not having achieved remission: Secondary | ICD-10-CM | POA: Diagnosis not present

## 2022-12-16 LAB — GLUCOSE, CAPILLARY
Glucose-Capillary: 152 mg/dL — ABNORMAL HIGH (ref 70–99)
Glucose-Capillary: 153 mg/dL — ABNORMAL HIGH (ref 70–99)
Glucose-Capillary: 163 mg/dL — ABNORMAL HIGH (ref 70–99)
Glucose-Capillary: 185 mg/dL — ABNORMAL HIGH (ref 70–99)
Glucose-Capillary: 201 mg/dL — ABNORMAL HIGH (ref 70–99)
Glucose-Capillary: 250 mg/dL — ABNORMAL HIGH (ref 70–99)

## 2022-12-16 MED ORDER — METOPROLOL TARTRATE 25 MG PO TABS: 75.0000 mg | ORAL_TABLET | Freq: Two times a day (BID) | ORAL | Status: DC

## 2022-12-16 MED ORDER — KCL IN DEXTROSE-NACL 20-5-0.9 MEQ/L-%-% IV SOLN
INTRAVENOUS | Status: DC
  Filled 2022-12-16 (×8): qty 1000

## 2022-12-16 MED ORDER — METOPROLOL TARTRATE 25 MG PO TABS
25.0000 mg | ORAL_TABLET | Freq: Two times a day (BID) | ORAL | Status: DC
  Administered 2022-12-16 – 2022-12-28 (×22): 25 mg via ORAL
  Filled 2022-12-16 (×24): qty 1

## 2022-12-16 NOTE — Plan of Care (Signed)

## 2022-12-16 NOTE — Progress Notes (Addendum)
Marland Kitchen  HEMATOLOGY/ONCOLOGY INPATIENT PROGRESS NOTE  Date of Service: 12/15/2022  Inpatient Attending: .Marguerita Merles [place], DO   SUBJECTIVE  [name] was seen in follow-up for continued evaluation and management of his multiple myeloma. Scott was admitted with severe anemia and renal failure after not having been able to do his PET scan and follow-up in the oncology clinic.  Scott was to start outpatient [facility] as outpatient as inpatient Scott has had weekly Velcade x 2 doses with the last dose on 12/14/2022.   His renal function and anemia have improved but Scott still is quite fatigued.  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  Scott also has limited support at home and lives with his wife who is also visually challenged. Patient was very restless and initially said that Scott wanted to just go home and be kept comfortable. We consulted palliative care and after further discussions with him and his wife Scott has now agreed to consider transitioning to rehabilitation either at [facility] or SNF and wants to continue outpatient treatments for his multiple myeloma upon discharge. No reported focal bone pain.    OBJECTIVE:  NAD  PHYSICAL EXAMINATION: . Vitals:   12/15/22 0406 12/15/22 0754 12/15/22 1318 12/16/22 0449  BP: 121/68 126/67 132/76 119/73  Pulse: 100 (!) 103 98 84  Resp: 20 18 18 17   Temp: 98.2 F (36.8 C) 98 F (36.7 C) 98.1 F (36.7 C) 98.6 F (37 C)  TempSrc:      SpO2: 99% 100% 100% 100%  Weight:      Height:       Filed Weights   12/12/22 0500 12/13/22 0402 12/14/22 0700  Weight: 156 lb 1.4 oz (70.8 kg) 143 lb 11.8 oz (65.2 kg) 142 lb 13.7 oz (64.8 kg)   .Body mass index is 23.06 kg/m.  GENERAL:alert, restless and wanted to go home soon. OROPHARYNX: Mucous membranes moist NECK: supple, no JVD, thyroid normal size, non-tender, without nodularity LYMPH:  no palpable lymphadenopathy in the cervical, axillary or inguinal LUNGS: clear  to auscultation with normal respiratory effort HEART: regular rate & rhythm,  no murmurs and no lower extremity edema ABDOMEN: abdomen soft, non-tender, normoactive bowel sounds  PSYCH: alert & oriented x 3  NEURO: no focal motor/sensory deficits  MEDICAL HISTORY:  Past Medical History:  Diagnosis Date   Arthritis    "lower back" (12/18/2017)   Better eye: moderate vision impairment; lesser eye: blind    GERD (gastroesophageal reflux disease)    Glaucoma, both eyes    High cholesterol    Hypertension    Hypothyroidism    Irregular heartbeat    Legally blind    "both eyes; can see some out of left eye"   OSA (obstructive sleep apnea)    Thyroid disease    Type II diabetes mellitus (HCC)    Vitamin B12 deficiency     SURGICAL HISTORY: Past Surgical History:  Procedure Laterality Date   LEFT HEART CATH AND CORONARY ANGIOGRAPHY N/A 12/18/2017   Procedure: LEFT HEART CATH AND CORONARY ANGIOGRAPHY;  Surgeon: Elder Negus, MD;  Location: [facility] INVASIVE CV LAB;  Service: Cardiovascular;  Laterality: N/A;   NOSE SURGERY     "was crooked; they straightened it out" (12/18/2017)    SOCIAL HISTORY: Social History   Socioeconomic History   Marital status: Married    Spouse name: Not on file   Number of children: 0   Years of education: Not on file  Highest education level: Not on file  Occupational History   Occupation: retired  Tobacco Use   Smoking status: Never   Smokeless tobacco: Never  Vaping Use   Vaping status: Never Used  Substance and Sexual Activity   Alcohol use: Not Currently    Comment: occ   Drug use: Never   Sexual activity: Not Currently  Other Topics Concern   Not on file  Social History Narrative   Not on file   Social Determinants of Health   Financial Resource Strain: Low Risk  (10/10/2022)   Received from [facility], [facility]   Overall Financial Resource Strain (CARDIA)    Difficulty of Paying Living Expenses: Not very hard  Food  Insecurity: Food Insecurity Present (12/06/2022)   Hunger Vital Sign    Worried About Running Out of Food in the Last Year: Often true    Ran Out of Food in the Last Year: Often true  Transportation Needs: No Transportation Needs (12/06/2022)   PRAPARE - Administrator, Civil Service (Medical): No    Lack of Transportation (Non-Medical): No  Physical Activity: Insufficiently Active (12/23/2020)   Received from [facility], [facility]   Exercise Vital Sign    Days of Exercise per Week: 2 days    Minutes of Exercise per Session: 10 min  Stress: No Stress Concern Present (12/23/2020)   Received from [facility], [facility] - Occupational Stress Questionnaire    Feeling of Stress : Not at all  Social Connections: Unknown (09/16/2021)   Received from [facility], [facility]   Social Network    Social Network: Not on file  Intimate Partner Violence: Patient Unable To Answer (12/06/2022)   Humiliation, Afraid, Rape, and Kick questionnaire    Fear of Current or Ex-Partner: Patient unable to answer    Emotionally Abused: Patient unable to answer    Physically Abused: Patient unable to answer    Sexually Abused: Patient unable to answer    FAMILY HISTORY: Family History  Problem Relation Age of Onset   Diabetes Mellitus II Mother 67   Diabetes Mellitus II Sister    Breast cancer Sister    Heart failure Sister 52    ALLERGIES:  is allergic to hctz [hydrochlorothiazide].  MEDICATIONS:  Scheduled Meds:  amLODipine  5 mg Oral Daily   brimonidine  1 drop Both Eyes TID   dexamethasone  20 mg Oral Daily   ferrous sulfate  325 mg Oral QODAY   fluconazole  100 mg Oral Daily   folic acid  1 mg Oral Daily   insulin aspart  0-15 Units Subcutaneous Q4H   latanoprost  1 drop Both Eyes QHS   levothyroxine  150 mcg Oral Q0600   pantoprazole  40 mg Oral BID   polyethylene glycol  17 g Oral Daily   sennosides  15 mL Oral BID    sodium bicarbonate  650 mg Oral TID   tamsulosin  0.4 mg Oral Daily   Continuous Infusions: PRN Meds:.docusate sodium, haloperidol lactate, hydrALAZINE, HYDROmorphone (DILAUDID) injection, ondansetron (ZOFRAN) IV, ondansetron, mouth rinse, phenol, polyethylene glycol, simethicone  REVIEW OF SYSTEMS:    10 Point review of Systems was done is negative except as noted above.   LABORATORY DATA:  I have reviewed the data as listed  .    Latest Ref Rng & Units 12/15/2022    5:22 AM 12/14/2022    6:20 AM  CBC  WBC  4.0 - 10.5 K/uL 8.9  6.1   Hemoglobin 13.0 - 17.0 g/dL 8.0  7.9   Hematocrit 16.1 - 52.0 % 25.3  26.4   Platelets 150 - 400 K/uL 153  129     .    RADIOGRAPHIC STUDIES: I have personally reviewed the radiological images as listed and agreed with the findings in the report. CT Chest Wo Contrast  Result Date: 12/04/2022 CLINICAL DATA:  Blunt chest trauma. EXAM: CT CHEST, ABDOMEN AND PELVIS WITHOUT CONTRAST TECHNIQUE: Multidetector CT imaging of the chest, abdomen and pelvis was performed following the standard protocol without IV contrast. RADIATION DOSE REDUCTION: This exam was performed according to the departmental dose-optimization program which includes automated exposure control, adjustment of the mA and/or kV according to patient size and/or use of iterative reconstruction technique. COMPARISON:  Chest CT dated 09/26/2022. FINDINGS: CT CHEST FINDINGS Cardiovascular: No thoracic aortic aneurysm. No pericardial effusion. Mediastinum/Nodes: No mass or enlarged lymph nodes within the mediastinum. No hemorrhage or edema is seen within the mediastinum. Esophagus is unremarkable. Trachea is unremarkable. Lungs/Pleura: Mild dependent atelectasis bilaterally. Lungs appear otherwise clear. No pleural effusion or hemothorax. No pneumothorax. Musculoskeletal: Innumerable lytic lesions throughout the thoracic spine and bilateral ribs, corresponding to patient's known multiple myeloma.  Associated large expansile lesion within the anterior second RIGHT rib. Multiple associated pathologic rib fractures bilaterally, of uncertain age but likely chronic. Displaced fracture of the lower RIGHT scapula, also likely pathologic related to underlying lytic lesions (multiple myeloma), of uncertain age. No acute appearing fracture or subluxation within the thoracic spine. Large expansile mass involving the T4 vertebral body with soft tissue component extending into the central canal and LEFT neural foramen, with almost certain mass effect on the thoracic cord and exiting nerve root. CT ABDOMEN PELVIS FINDINGS Hepatobiliary: No focal liver abnormality is seen. Gallbladder is unremarkable. No perihepatic hematoma. No bile duct dilatation is seen. Pancreas: Partially infiltrated with fat but otherwise unremarkable. Spleen: No splenic injury or perisplenic hematoma. Adrenals/Urinary Tract: Adrenal glands appear normal. No adrenal hemorrhage. No renal injury identified. No renal stone or hydronephrosis. No ureteral or bladder calculi are identified. Bladder is unremarkable. Stomach/Bowel: No dilated large or small bowel loops. No evidence of bowel wall inflammation or bowel wall injury. Appendix is normal. Stomach is unremarkable. Vascular/Lymphatic: No abdominal aortic aneurysm. No periaortic hemorrhage. No enlarged lymph nodes are seen in the abdomen or pelvis. Reproductive: Moderate prostate gland enlargement with some associated mass effect on the bladder base. Other: No free fluid or hemorrhage is seen within the abdomen or pelvis. No free intraperitoneal air. Musculoskeletal: Innumerable lytic lesions throughout the lumbar spine and osseous pelvis, corresponding to patient's known multiple myeloma. Questionable mild compression deformity of the L4 vertebral body, likely pathologic, nonacute and related to the underlying lytic changes of patient's multiple myeloma. No acute-appearing osseous abnormality is  seen within the abdomen or pelvis. IMPRESSION: 1. Innumerable lytic lesions throughout the thoracic spine, bilateral ribs, lumbar spine and osseous pelvis, corresponding to patient's known multiple myeloma. 2. Large expansile mass involving the T4 vertebral body with soft tissue component extending into the central canal and LEFT neural foramen, with almost certain mass effect on the thoracic cord and exiting nerve root. Consider MRI of the thoracic spine without and with IV contrast to further evaluate. 3. Multiple associated pathologic rib fractures bilaterally, of uncertain age but likely chronic. 4. Displaced fracture of the lower RIGHT scapula, of uncertain age but also likely pathologic related to underlying lytic lesions (multiple myeloma).  5. Questionable mild compression deformity of the L4 vertebral body, likely pathologic, nonacute and related to the underlying lytic changes of patient's multiple myeloma. 6. No acute findings within the chest, abdomen or pelvis. No evidence of solid organ injury. No hemorrhage or edema within the mediastinum. No pleural effusion or pneumothorax. No evidence of bowel wall injury. 7. Moderate prostate gland enlargement with some associated mass effect on the bladder base. Electronically Signed   By: Bary Richard M.D.   On: 12/04/2022 08:42   CT ABDOMEN PELVIS WO CONTRAST  Result Date: 12/04/2022 CLINICAL DATA:  Blunt chest trauma. EXAM: CT CHEST, ABDOMEN AND PELVIS WITHOUT CONTRAST TECHNIQUE: Multidetector CT imaging of the chest, abdomen and pelvis was performed following the standard protocol without IV contrast. RADIATION DOSE REDUCTION: This exam was performed according to the departmental dose-optimization program which includes automated exposure control, adjustment of the mA and/or kV according to patient size and/or use of iterative reconstruction technique. COMPARISON:  Chest CT dated 09/26/2022. FINDINGS: CT CHEST FINDINGS Cardiovascular: No thoracic aortic  aneurysm. No pericardial effusion. Mediastinum/Nodes: No mass or enlarged lymph nodes within the mediastinum. No hemorrhage or edema is seen within the mediastinum. Esophagus is unremarkable. Trachea is unremarkable. Lungs/Pleura: Mild dependent atelectasis bilaterally. Lungs appear otherwise clear. No pleural effusion or hemothorax. No pneumothorax. Musculoskeletal: Innumerable lytic lesions throughout the thoracic spine and bilateral ribs, corresponding to patient's known multiple myeloma. Associated large expansile lesion within the anterior second RIGHT rib. Multiple associated pathologic rib fractures bilaterally, of uncertain age but likely chronic. Displaced fracture of the lower RIGHT scapula, also likely pathologic related to underlying lytic lesions (multiple myeloma), of uncertain age. No acute appearing fracture or subluxation within the thoracic spine. Large expansile mass involving the T4 vertebral body with soft tissue component extending into the central canal and LEFT neural foramen, with almost certain mass effect on the thoracic cord and exiting nerve root. CT ABDOMEN PELVIS FINDINGS Hepatobiliary: No focal liver abnormality is seen. Gallbladder is unremarkable. No perihepatic hematoma. No bile duct dilatation is seen. Pancreas: Partially infiltrated with fat but otherwise unremarkable. Spleen: No splenic injury or perisplenic hematoma. Adrenals/Urinary Tract: Adrenal glands appear normal. No adrenal hemorrhage. No renal injury identified. No renal stone or hydronephrosis. No ureteral or bladder calculi are identified. Bladder is unremarkable. Stomach/Bowel: No dilated large or small bowel loops. No evidence of bowel wall inflammation or bowel wall injury. Appendix is normal. Stomach is unremarkable. Vascular/Lymphatic: No abdominal aortic aneurysm. No periaortic hemorrhage. No enlarged lymph nodes are seen in the abdomen or pelvis. Reproductive: Moderate prostate gland enlargement with some  associated mass effect on the bladder base. Other: No free fluid or hemorrhage is seen within the abdomen or pelvis. No free intraperitoneal air. Musculoskeletal: Innumerable lytic lesions throughout the lumbar spine and osseous pelvis, corresponding to patient's known multiple myeloma. Questionable mild compression deformity of the L4 vertebral body, likely pathologic, nonacute and related to the underlying lytic changes of patient's multiple myeloma. No acute-appearing osseous abnormality is seen within the abdomen or pelvis. IMPRESSION: 1. Innumerable lytic lesions throughout the thoracic spine, bilateral ribs, lumbar spine and osseous pelvis, corresponding to patient's known multiple myeloma. 2. Large expansile mass involving the T4 vertebral body with soft tissue component extending into the central canal and LEFT neural foramen, with almost certain mass effect on the thoracic cord and exiting nerve root. Consider MRI of the thoracic spine without and with IV contrast to further evaluate. 3. Multiple associated pathologic rib fractures bilaterally, of uncertain age  but likely chronic. 4. Displaced fracture of the lower RIGHT scapula, of uncertain age but also likely pathologic related to underlying lytic lesions (multiple myeloma). 5. Questionable mild compression deformity of the L4 vertebral body, likely pathologic, nonacute and related to the underlying lytic changes of patient's multiple myeloma. 6. No acute findings within the chest, abdomen or pelvis. No evidence of solid organ injury. No hemorrhage or edema within the mediastinum. No pleural effusion or pneumothorax. No evidence of bowel wall injury. 7. Moderate prostate gland enlargement with some associated mass effect on the bladder base. Electronically Signed   By: Bary Richard M.D.   On: 12/04/2022 08:42   CT L-SPINE NO CHARGE  Result Date: 12/04/2022 CLINICAL DATA:  Fall. EXAM: CT LUMBAR SPINE WITHOUT CONTRAST TECHNIQUE: Multidetector CT  imaging of the lumbar spine was performed without intravenous contrast administration. Multiplanar CT image reconstructions were also generated. RADIATION DOSE REDUCTION: This exam was performed according to the departmental dose-optimization program which includes automated exposure control, adjustment of the mA and/or kV according to patient size and/or use of iterative reconstruction technique. COMPARISON:  Plain film of the lumbar spine dated 11/22/2015. FINDINGS: Segmentation: 5 lumbar type vertebrae. Alignment: Stable.  No evidence of acute vertebral body subluxation. Vertebrae: No fracture line or displaced fracture fragment is seen. Questionable mild compression of the L4 vertebral body which is likely nonacute. Innumerable lytic lesions throughout the lumbar spine, corresponding to patient's known multiple myeloma. The mild compression of the L4 vertebral body is likely related to the underlying lytic changes. Paraspinal and other soft tissues: Visualized immediate paravertebral soft tissues are unremarkable. Disc levels: Disc desiccations at each level of the lumbar spine, with associated disc space narrowings and osteophyte formation, causing moderate to severe central canal stenoses at the L2-3 through L4-5 levels and mild-to-moderate central canal stenosis at L5-S1. Various degrees of neural foramen narrowing at each level of the lumbar spine as well, with possible associated nerve root impingement. IMPRESSION: 1. No acute fracture or subluxation within the lumbar spine. 2. Innumerable lytic lesions throughout the lumbar spine, corresponding to patient's known multiple myeloma. 3. Questionable interval mild compression of the L4 vertebral body which is likely nonacute and related to the underlying lytic lesions (multiple myeloma). 4. Multilevel degenerative disc disease, causing moderate to severe central canal stenoses at the L2-3 through L4-5 levels and mild-to-moderate central canal stenosis at  L5-S1, with possible associated nerve root impingement at 1 or more levels. Electronically Signed   By: Bary Richard M.D.   On: 12/04/2022 08:30   CT Head Wo Contrast  Result Date: 12/04/2022 CLINICAL DATA:  Head trauma, moderate-severe; Neck trauma (Age >= 65y) EXAM: CT HEAD WITHOUT CONTRAST CT CERVICAL SPINE WITHOUT CONTRAST TECHNIQUE: Multidetector CT imaging of the head and cervical spine was performed following the standard protocol without intravenous contrast. Multiplanar CT image reconstructions of the cervical spine were also generated. RADIATION DOSE REDUCTION: This exam was performed according to the departmental dose-optimization program which includes automated exposure control, adjustment of the mA and/or kV according to patient size and/or use of iterative reconstruction technique. COMPARISON:  None Available. FINDINGS: CT HEAD FINDINGS Brain: No hemorrhage. No extra-axial fluid collection. No hydrocephalus. No CT evidence of an acute cortical infarct. Mild chronic microvascular ischemic change. Vascular: No hyperdense vessel or unexpected calcification. Skull: No acute fracture. There are nonspecific scattered lucent lesions throughout the calvarium, largest which is at the vertex (series 7, image 41). Sinuses/Orbits: No middle ear or mastoid effusion. Polypoid mucosal thickening  in the right sphenoid left maxillary sinus. Orbits are unremarkable. Other: None. CT CERVICAL SPINE FINDINGS Alignment: Straightening of the normal cervical lordosis. Skull base and vertebrae: There is a mildly displaced fracture through the right C7 transverse process (series 5, image 30). 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 There are lytic lesions throughout the cervical spine involving every cervical vertebral body. Some of the lesions extend through the posterior cortex, for example at the right posterior C4 endplate (series 7, image 35) in the left aspect of T3 (series 3, image 2).  Metastatic lesions are also seen in the bilateral scapula. Soft tissues and spinal canal: There is likely severe spinal canal stenosis at the T3 level secondary to epidural extension of tumor (series 9, image 1). Disc levels:  See above Upper chest: Negative. Other: None IMPRESSION: 1. No acute intracranial abnormality. 2. Mildly displaced fracture through the right C7 transverse process. 3. Multiple lytic lesions throughout the cervical spine, calvarium, and bilateral scapula. Findings could be seen in the setting of diffuse metastatic disease of myeloma. Some of the lesions extend through the posterior cortex, for example at the right posterior C4 endplate and left aspect of T3, with likely severe spinal canal stenosis at the T3 level secondary to epidural extension of tumor. Recommend MRI of the cervical and thoracic spine with and without contrast for further evaluation. Electronically Signed   By: Lorenza Cambridge M.D.   On: 12/04/2022 08:30   CT Cervical Spine Wo Contrast  Result Date: 12/04/2022 CLINICAL DATA:  Head trauma, moderate-severe; Neck trauma (Age >= 65y) EXAM: CT HEAD WITHOUT CONTRAST CT CERVICAL SPINE WITHOUT CONTRAST TECHNIQUE: Multidetector CT imaging of the head and cervical spine was performed following the standard protocol without intravenous contrast. Multiplanar CT image reconstructions of the cervical spine were also generated. RADIATION DOSE REDUCTION: This exam was performed according to the departmental dose-optimization program which includes automated exposure control, adjustment of the mA and/or kV according to patient size and/or use of iterative reconstruction technique. COMPARISON:  None Available. FINDINGS: CT HEAD FINDINGS Brain: No hemorrhage. No extra-axial fluid collection. No hydrocephalus. No CT evidence of an acute cortical infarct. Mild chronic microvascular ischemic change. Vascular: No hyperdense vessel or unexpected calcification. Skull: No acute fracture. There are  nonspecific scattered lucent lesions throughout the calvarium, largest which is at the vertex (series 7, image 41). Sinuses/Orbits: No middle ear or mastoid effusion. Polypoid mucosal thickening in the right sphenoid left maxillary sinus. Orbits are unremarkable. Other: None. CT CERVICAL SPINE FINDINGS Alignment: Straightening of the normal cervical lordosis. Skull base and vertebrae: There is a mildly displaced fracture through the right C7 transverse process (series 5, image 30). 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 There are lytic lesions throughout the cervical spine involving every cervical vertebral body. Some of the lesions extend through the posterior cortex, for example at the right posterior C4 endplate (series 7, image 35) in the left aspect of T3 (series 3, image 2). Metastatic lesions are also seen in the bilateral scapula. Soft tissues and spinal canal: There is likely severe spinal canal stenosis at the T3 level secondary to epidural extension of tumor (series 9, image 1). Disc levels:  See above Upper chest: Negative. Other: None IMPRESSION: 1. No acute intracranial abnormality. 2. Mildly displaced fracture through the right C7 transverse process. 3. Multiple lytic lesions throughout the cervical spine, calvarium, and bilateral scapula. Findings could be seen in the setting of diffuse metastatic disease of myeloma. Some of the  lesions extend through the posterior cortex, for example at the right posterior C4 endplate and left aspect of T3, with likely severe spinal canal stenosis at the T3 level secondary to epidural extension of tumor. Recommend MRI of the cervical and thoracic spine with and without contrast for further evaluation. Electronically Signed   By: Lorenza Cambridge M.D.   On: 12/04/2022 08:30   DG Chest Portable 1 View  Result Date: 12/04/2022 CLINICAL DATA:  Status post fall. EXAM: PORTABLE CHEST 1 VIEW COMPARISON:  PET-CT 10/06/2022 FINDINGS: Heart size  and mediastinal contours are unremarkable. No pleural fluid or interstitial edema. No airspace consolidation. 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 right upper lobe lung mass is again seen measuring approximately 4.4 cm on today's study. Again seen are extensive, multifocal lytic bone metastases which involve bilateral ribs. Age indeterminate pathologic fractures involving the lateral aspect of the right fourth rib and left posterior sixth rib noted. IMPRESSION: 1. No acute cardiopulmonary disease. 2. Right upper lobe lung mass. 3. Extensive lytic bone metastases. Age indeterminate pathologic fractures involving the lateral aspect of the right fourth rib and left posterior sixth rib. Electronically Signed   By: Signa Kell M.D.   On: 12/04/2022 06:06    ASSESSMENT & PLAN:   72 year old legally blind Bass with significant functional limitations and limited social support presented with who was newly diagnosed with multiple myeloma and of May 2024 and was set up for chemo counseling to start daratumumab dexamethasone Velcade and Xgeva and orders were placed and the patient was to get a PET scan but was lost to follow-up.  Scott had also been referred to social work at that time to help address his barriers of care. Scott is now admitted with progressive multiple myeloma.  Multiple myeloma, IgG kappa, diagnosed May 2024-untreated since patient did not follow-up now admitted with progressive myeloma with severe anemia and progressive renal failure. CTs 12/04/2022-innumerable lytic lesions, large expansile lesion in the anterior right second rib, multiple pathologic rib fractures, expansile T4 (T3 on CT cervical spine) mass with soft tissue component extending into the central canal and left neural foramen, C7 transverse process fracture Decadron daily x 4 starting 12/05/2022 Cycle 1 day 1 Velcade 12/07/2022 Decadron daily x 4 starting 12/12/2022 Cycle 1 day 8 Velcade 12/14/2022 2.  Acute/chronic renal  failure 3.  Severe anemia 4.  Mild thrombocytopenia 5.  Diabetes 6.  Hypertension 7.  Glaucoma 8.  OSA PLAN -Patient denies any overt pain at this time and his anemia and renal function has improved from admission. -Scott has been started on Velcade and dexamethasone since Scott could not receive daratumumab as inpatient. -Currently does not describe any significant uncontrolled pain. -Scott has been evaluated by physical therapy to determine any functional limitations. -Scott has extensive bone lesions -I spent significant time with him today trying to define goals of care.  With his significant functional limitations and limited social support at home with only his wife who is also legally blind we discussed the challenges of continued follow-up and significant logistics of treatment including possible transfusion support weekly treatments scans and frequent follow-up with the cancer center. -Initially the patient was very restless and noted that it was illegal to keep him in the hospital and Scott just wants to go home and be kept comfortable and was not interested in any cancer treatments and just wanted to be with his wife and be kept comfortable. -Consequently we consulted the palliative medicine team to help further define goals of  care.  When they discussed again with the patient in the presence of his wife Scott seems to have changed his mind and is now open to going to a rehabilitation to a SNF or CIR and continue his myeloma treatments. We will continue to define goals of care -Will plan to start monthly Xgeva as outpatient. Holding currently in context of hypocalcemia. -At this time no clear symptoms of cord compression to suggest consulting radiation oncology immediately for radiation to his T4 met. -Scott will need outpatient PET scan -Will need to continue weekly Velcade and start daratumumab with his next scheduled dose on 12/21/2022 as outpatient. -Continue to monitor blood counts and transfuse as  needed for hemoglobin less than 7.5. -Dietary consultation and optimization of nutritional status to optimize p.o. fluid intake.  -Due to the underlying disease is quite treatable the significant challenges going to be the patient's ability to follow-up for his planned treatment and to maintain his nutritional status.  Scott was quite dehydrated coming into the hospital as well. -Will need to have continued palliative care follow-ups and we will set him up for this clinic. -Depending on his clinical trajectory in the next several weeks might need to be discussed the idea of comfort cares through hospice if Scott is not able to maintain his functional status. -Okay to discharge to SNF or CIR from oncology standpoint we will set up outpatient follow-ups    .The total time spent in the appointment was 60 minutes* .  All of the patient's questions were answered with apparent satisfaction. The patient knows to call the clinic with any problems, questions or concerns.   Wyvonnia Lora MD MS AAHIVMS [facility] Hematology/Oncology Physician [facility]  .*Total Encounter Time as defined by the Centers for Medicare and Medicaid Services includes, in addition to the face-to-face time of a patient visit (documented in the note above) non-face-to-face time: obtaining and reviewing outside history, ordering and reviewing medications, tests or procedures, care coordination (communications with other health care professionals or caregivers) and documentation in the medical record.

## 2022-12-16 NOTE — Progress Notes (Signed)
PROGRESS NOTE    Scott Bass  ONG:[id number] DOB: [birth year] DOA: 12/04/2022 PCP: Tracey Harries, MD   Brief Narrative:  This 72 years old wheelchair-bound male with PMH significant for blindness, recently diagnosed multiple myeloma in May 2024 but has not started therapy yet, hypertension, diabetes mellitus presented s/p fall and found to be confused, tachycardic and constipated with labs significant for AKI with hyperkalemia and severe metabolic acidosis. He was also found to have hemoglobin 3.5, lactic acid 6.0. Patient was initially admitted in the ICU and started on broad-spectrum antibiotics and fluid resuscitation. Patient has received 3 units of packed red blood cells. hemoglobin 7.5. TRH pickup 12/06/2022.  He now agreeable to going to CIR and wants to continue with cancer directed therapies.  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  Not a candidate for hemodialysis given advanced malignancy, comorbidity and functional status. -Avoid Nephrotoxic Medications, Contrast Dyes, Hypotension and Dehydration to Ensure Adequate Renal Perfusion and will need to Renally Adjust Meds -Continue to Monitor and Trend Renal Function carefully and repeat CMP in the AM  -IV fluids discontinued, serum creatinine continued to improve but now slightly worsening  and will need to be monitiored -Good urine output, Foley was removed 8/3.   Multiple myeloma with diffuse pathological fractures Mild thrombocytopenia: -Oncology is consulted recommended Decadron and Velcade. -Palliative consulted to discuss goals of care and he wants to go home but after further  discussion with his wife he is going to go to CIR and continue chemotherapy. -Velcade dose given on 8/8 and then plan is to start weekly dosing as per oncologist -Platelet Count Trend: Recent Labs  Lab 12/07/22 0327 12/09/22 0323 12/10/22 1043 12/13/22 0956 12/14/22 0620 12/15/22 0522 12/16/22 0606  PLT 116* 115* 105* 132* 129* 153 146*  -PT/OT recommending CIR -Palliative care consulted and patient had expressed that he wanted to go home with goals of care readdressed and now the patient wants to continue cancer directed treatments and pursue rehabilitation and he does not want to be comfort care and they are not on board with the hospice philosophy is for now -Will go to CIR once they initiate acceptance     Hyperkalemia: Resolved Resolved with calcium gluconate, Lasix, insulin and D50. Now hypokalemia > being replaced. Continue to monitor -K+ Trend: Recent Labs  Lab 12/10/22 1043 12/11/22 0614 12/12/22 0310 12/13/22 0547 12/14/22 0620 12/15/22 0522 12/16/22 0606  K 3.4* 3.3* 3.1* 3.8 3.6 3.9 3.9  -Continue to monitor and replete as necessary -Repeat potassium and CMP in a.m.   Hyponatremia:  -IVF discontinued, Continue to monitor. Na+ Trend: Recent Labs  Lab 12/10/22 1043 12/11/22 0614 12/12/22 0310 12/13/22 0547 12/14/22 0620 12/15/22 0522 12/16/22 0606  NA 142 146* 146* 141 143 134* 136    Metabolic Acidosis -s/p sodium bicarbonate infusion. -Started oral bicarbonate and will continue  -Patient has a CO2 of 19, anion gap of 13, chloride 102 yesterday and today he has a CO2 of 20, anion gap of 12, chloride level 104 -Continue to continue monitor and trend and repeat CMP in a.m.   Lactic acidosis > Improved. -IVF discontinued.  -Lactic Acid Level Trend: Recent Labs  Lab 12/04/22 0529 12/04/22 0819  LATICACIDVEN 6.0* 2.7*  -Continue monitor and trend and will not repeat lactic acid   Acute anemia likely secondary to multiple myeloma: -S/p 2 unit  PRBC -Hgb/Hct Trend: Recent Labs  Lab 12/08/22 0321 12/09/22 0323 12/10/22 1043 12/13/22 0956 12/14/22 0620 12/15/22 0522 12/16/22 0606  HGB 7.8* 8.1* 8.5* 9.0* 7.9* 8.0* 8.5*  HCT 24.8* 26.3* 28.1* 30.4* 26.4* 25.3* 26.9*  MCV  --  93.9 95.6 97.4 97.1 93.4 91.5  -Keep hemoglobin above 7. -Continue to monitor for signs and symptoms bleeding; no overt bleeding noted   Tachycardia -Likely multifactorial due to anemia, dehydration, suspected sepsis and agitation and beta-blocker withdrawal. -One of the 4 blood culture showed Staphylococcus Capitis.  Likely contaminant -Continue monitor heart rates carefully and is noted that his beta-blocker was not resume but will resume now -Continue to monitor on telemetry   Large mass involving T4 vertebral body with soft tissue component: -MRI with and without C and T-spine when renal functions improves.   Acute Metabolic Encephalopathy, improving  -Likely secondary to multiple myeloma, uremia and others. -Minimize sedation,  reorient as above. -Continue Haldol as needed for agitation and restlessness. -Psychiatry consulted for agitation, restlessness.    Constipation -Continue aggressive bowel regimen with senna Swords 15 mL p.o. twice daily, MiraLAX 17 g p.o. daily and daily as needed for moderate constipation, and docusate 100 mg p.o. twice daily as needed for mild constipation   Hypothyroidism -Continue Levothyroxine 150 mcg po Daily .  Glaucoma -Continue eyedrops with latanoprost 1 drop in both eyes nightly, brimonidine 1 drop in both eyes 3 times daily.   -Right eye blind, very poor vision.  Patient can see through left eye   Folic Acid Deficiency, -Started folate supplement   Abnormal LFTs -Mild. LFT Trend: Recent Labs  Lab 12/04/22 0515 12/06/22 0615 12/16/22 0606  AST 35 31 53*  ALT 28 19 51*  BILITOT 1.0 0.9 0.8  ALKPHOS 62 58 135*  -Continue to Monitor and Trend and Likely Reactive -Repeat CMP in the AM    Moderate Malnutrition Estimated body mass index is 23.06 kg/m as calculated from the following:   Height as of this encounter: 5\' 6"  (1.676 m).   Weight as of this encounter: 64.8 kg. Nutrition Status: Nutrition Problem: Moderate Malnutrition Etiology: chronic illness Signs/Symptoms: mild fat depletion, mild muscle depletion, percent weight loss (28% in 3 months) Percent weight loss: 28 % (in 3 months) Interventions: Refer to RD note for recommendations, Magic cup, Hormel Shake  Hypoalbuminemia -Patient's Albumin Trend: Recent Labs  Lab 12/04/22 0515 12/06/22 0615 12/09/22 0323 12/16/22 0606  ALBUMIN 2.6* 1.9* 2.0* 2.3*  -Continue to Monitor and Trend and repeat CMP in the AM   DVT prophylaxis: SCDs Start: 12/04/22 0948    Code Status: DNR Family Communication: No family at bedside  Disposition Plan:  Level of care: Telemetry Status is: Inpatient Remains inpatient appropriate because: Needs to go to CIR once he is excepted   Consultants:  Medical oncology Palliative care medicine PCCM transfer CIR Nephrology  Procedures:  As delineated as above  Antimicrobials:  Anti-infectives (From admission, onward)    Start     Dose/Rate Route Frequency Ordered Stop   12/11/22 1000  fluconazole (DIFLUCAN) tablet 100 mg        100 mg Oral Daily 12/11/22 0844 12/16/22 0959   12/05/22 1000  cefTRIAXone (ROCEPHIN) 2 g in sodium chloride 0.9 % 100 mL IVPB  2 g 200 mL/hr over 30 Minutes Intravenous Every 24 hours 12/04/22 1340 12/08/22 1008   12/04/22 0600  ceFEPIme (MAXIPIME) 2 g in sodium chloride 0.9 % 100 mL IVPB        2 g 200 mL/hr over 30 Minutes Intravenous  Once 12/04/22 0557 12/04/22 0726   12/04/22 0600  metroNIDAZOLE (FLAGYL) IVPB 500 mg        500 mg 100 mL/hr over 60 Minutes Intravenous  Once 12/04/22 0557 12/04/22 0829   12/04/22 0600  vancomycin (VANCOCIN) IVPB 1000 mg/200 mL premix        1,000 mg 200 mL/hr over 60 Minutes Intravenous  Once 12/04/22  0557 12/04/22 1610       Subjective: Seen and examined at bedside and he is doing okay.  Now wanting to go to CIR.  States he slept okay.  No nausea or vomiting.  No pain.  Denies any other concerns or complaints at present time.  Objective: Vitals:   12/16/22 1249 12/16/22 1251 12/16/22 1300 12/16/22 1519  BP: 97/65 96/65  (!) 89/71  Pulse: (!) 103 (!) 107 (!) 109 (!) 109  Resp: 18 18  20   Temp:  98.2 F (36.8 C)  98.2 F (36.8 C)  TempSrc:      SpO2: 99% 100% 98% 99%  Weight:      Height:        Intake/Output Summary (Last 24 hours) at 12/16/2022 1849 Last data filed at 12/16/2022 1800 Gross per 24 hour  Intake 1360 ml  Output 1250 ml  Net 110 ml   Filed Weights   12/12/22 0500 12/13/22 0402 12/14/22 0700  Weight: 70.8 kg 65.2 kg 64.8 kg   Examination: Physical Exam:  Constitutional: Chronically ill-appearing African-American male who was lying in no acute distress Respiratory: Diminished to auscultation bilaterally, no wheezing, rales, rhonchi or crackles. Normal respiratory effort and patient is not tachypenic. No accessory muscle use.  Unlabored breathing Cardiovascular: Low tachycardic but has regular rhythm, no murmurs / rubs / gallops. S1 and S2 auscultated.  Mild extremity edema Abdomen: Soft, non-tender, secondary to body habitus. Bowel sounds positive.  GU: Deferred. Musculoskeletal: No clubbing / cyanosis of digits/nails. No joint deformity upper and lower extremities.  Skin: No rashes, lesions, ulcers on limited skin evaluation. No induration; Warm and dry.  Neurologic: Patient is blind but other cranial nerves II through XII grossly intact Psychiatric: He is awake and alert and appears calm and not as agitated  Data Reviewed: I have personally reviewed following labs and imaging studies  CBC: Recent Labs  Lab 12/10/22 1043 12/13/22 0956 12/14/22 0620 12/15/22 0522 12/16/22 0606  WBC 4.3 6.1 6.1 8.9 9.0  NEUTROABS  --  4.7 4.7 8.1* 8.0*  HGB 8.5*  9.0* 7.9* 8.0* 8.5*  HCT 28.1* 30.4* 26.4* 25.3* 26.9*  MCV 95.6 97.4 97.1 93.4 91.5  PLT 105* 132* 129* 153 146*   Basic Metabolic Panel: Recent Labs  Lab 12/11/22 0614 12/12/22 0310 12/13/22 0547 12/14/22 0620 12/15/22 0522 12/16/22 0606  NA 146* 146* 141 143 134* 136  K 3.3* 3.1* 3.8 3.6 3.9 3.9  CL 113* 111 108 112* 102 104  CO2 21* 23 22 19* 19* 20*  GLUCOSE 143* 139* 124* 101* 129* 177*  BUN 69* 59* 52* 42* 48* 65*  CREATININE 2.59* 2.34* 2.45* 2.29* 2.42* 2.68*  CALCIUM 7.1* 7.2* 7.5* 7.9* 7.5* 7.7*  MG 2.0 1.9  --  1.8 1.8 2.4  PHOS 2.3* 3.4  --  2.8  4.3 4.2   GFR: Estimated Creatinine Clearance: 22.8 mL/min (A) (by C-G formula based on SCr of 2.68 mg/dL (H)). Liver Function Tests: Recent Labs  Lab 12/16/22 0606  AST 53*  ALT 51*  ALKPHOS 135*  BILITOT 0.8  PROT 6.8  ALBUMIN 2.3*   No results for input(s): "LIPASE", "AMYLASE" in the last 168 hours. No results for input(s): "AMMONIA" in the last 168 hours. Coagulation Profile: No results for input(s): "INR", "PROTIME" in the last 168 hours. Cardiac Enzymes: No results for input(s): "CKTOTAL", "CKMB", "CKMBINDEX", "TROPONINI" in the last 168 hours. BNP (last 3 results) No results for input(s): "PROBNP" in the last 8760 hours. HbA1C: No results for input(s): "HGBA1C" in the last 72 hours. CBG: Recent Labs  Lab 12/15/22 2351 12/16/22 0401 12/16/22 0805 12/16/22 1137 12/16/22 1618  GLUCAP 224* 153* 163* 201* 250*   Lipid Profile: No results for input(s): "CHOL", "HDL", "LDLCALC", "TRIG", "CHOLHDL", "LDLDIRECT" in the last 72 hours. Thyroid Function Tests: No results for input(s): "TSH", "T4TOTAL", "FREET4", "T3FREE", "THYROIDAB" in the last 72 hours. Anemia Panel: No results for input(s): "VITAMINB12", "FOLATE", "FERRITIN", "TIBC", "IRON", "RETICCTPCT" in the last 72 hours. Sepsis Labs: No results for input(s): "PROCALCITON", "LATICACIDVEN" in the last 168 hours.  No results found for this or any  previous visit (from the past 240 hour(s)).   Radiology Studies: No results found.  Scheduled Meds:  amLODipine  5 mg Oral Daily   brimonidine  1 drop Both Eyes TID   ferrous sulfate  325 mg Oral QODAY   folic acid  1 mg Oral Daily   insulin aspart  0-15 Units Subcutaneous Q4H   latanoprost  1 drop Both Eyes QHS   levothyroxine  150 mcg Oral Q0600   metoprolol tartrate  75 mg Oral BID   pantoprazole  40 mg Oral BID   polyethylene glycol  17 g Oral Daily   sennosides  15 mL Oral BID   sodium bicarbonate  650 mg Oral TID   tamsulosin  0.4 mg Oral Daily   Continuous Infusions:   LOS: 12 days   Marguerita Merles, DO [facility] Available via Epic secure chat 7am-7pm After these hours, please refer to coverage provider listed on [email or web address] 12/16/2022, 6:49 PM

## 2022-12-16 NOTE — Progress Notes (Signed)
   12/16/22 1251  Assess: MEWS Score  Temp 98.2 F (36.8 C)  BP 96/65  MAP (mmHg) 74  Pulse Rate (!) 107  Resp 18  SpO2 100 %  O2 Device Room Air  Patient Activity (if Appropriate) In chair  Assess: MEWS Score  MEWS Temp 0  MEWS Systolic 1  MEWS Pulse 1  MEWS RR 0  MEWS LOC 0  MEWS Score 2  MEWS Score Color Yellow  Assess: if the MEWS score is Yellow or Red  Were vital signs accurate and taken at a resting state? Yes  Does the patient meet 2 or more of the SIRS criteria? No  MEWS guidelines implemented  Yes, yellow  Treat  MEWS Interventions Considered administering scheduled or prn medications/treatments as ordered  Take Vital Signs  Increase Vital Sign Frequency  Yellow: Q2hr x1, continue Q4hrs until patient remains green for 12hrs  Escalate  MEWS: Escalate Yellow: Discuss with charge nurse and consider notifying provider and/or RRT  Notify: Charge Nurse/RN  Name of Charge Nurse/RN Notified Dekina RN  Provider Notification  Provider Name/Title Marland Mcalpine MD  Date Provider Notified 12/16/22  Time Provider Notified 1305  Method of Notification Page  Notification Reason Other (Comment) (yellow MEWS)  Provider response No new orders  Date of Provider Response 12/16/22  Time of Provider Response 1321  Assess: SIRS CRITERIA  SIRS Temperature  0  SIRS Pulse 1  SIRS Respirations  0  SIRS WBC 0  SIRS Score Sum  1   MD notified, no new orders. Will continue to monitor.

## 2022-12-17 DIAGNOSIS — R410 Disorientation, unspecified: Secondary | ICD-10-CM | POA: Diagnosis not present

## 2022-12-17 DIAGNOSIS — C9000 Multiple myeloma not having achieved remission: Secondary | ICD-10-CM | POA: Diagnosis not present

## 2022-12-17 DIAGNOSIS — E872 Acidosis, unspecified: Secondary | ICD-10-CM | POA: Diagnosis not present

## 2022-12-17 DIAGNOSIS — N170 Acute kidney failure with tubular necrosis: Secondary | ICD-10-CM | POA: Diagnosis not present

## 2022-12-17 LAB — COMPREHENSIVE METABOLIC PANEL WITH GFR
ALT: 53 U/L — ABNORMAL HIGH (ref 0–44)
AST: 55 U/L — ABNORMAL HIGH (ref 15–41)
Albumin: 2.3 g/dL — ABNORMAL LOW (ref 3.5–5.0)
Alkaline Phosphatase: 122 U/L (ref 38–126)
Anion gap: 14 (ref 5–15)
BUN: 70 mg/dL — ABNORMAL HIGH (ref 8–23)
CO2: 17 mmol/L — ABNORMAL LOW (ref 22–32)
Calcium: 7.6 mg/dL — ABNORMAL LOW (ref 8.9–10.3)
Chloride: 106 mmol/L (ref 98–111)
Creatinine, Ser: 2.76 mg/dL — ABNORMAL HIGH (ref 0.61–1.24)
GFR, Estimated: 24 mL/min — ABNORMAL LOW (ref >60)
Glucose, Bld: 176 mg/dL — ABNORMAL HIGH (ref 70–99)
Potassium: 4.1 mmol/L (ref 3.5–5.1)
Sodium: 137 mmol/L (ref 135–145)
Total Bilirubin: 0.6 mg/dL (ref 0.3–1.2)
Total Protein: 6.3 g/dL — ABNORMAL LOW (ref 6.5–8.1)

## 2022-12-17 LAB — CBC WITH DIFFERENTIAL/PLATELET
Abs Immature Granulocytes: 0.03 10*3/uL (ref 0.00–0.07)
Basophils Absolute: 0 10*3/uL (ref 0.0–0.1)
Basophils Relative: 0 %
Eosinophils Absolute: 0 10*3/uL (ref 0.0–0.5)
Eosinophils Relative: 0 %
HCT: 27.5 % — ABNORMAL LOW (ref 39.0–52.0)
Hemoglobin: 8.4 g/dL — ABNORMAL LOW (ref 13.0–17.0)
Immature Granulocytes: 1 %
Lymphocytes Relative: 17 %
Lymphs Abs: 0.4 10*3/uL — ABNORMAL LOW (ref 0.7–4.0)
MCH: 29.2 pg (ref 26.0–34.0)
MCHC: 30.5 g/dL (ref 30.0–36.0)
MCV: 95.5 fL (ref 80.0–100.0)
Monocytes Absolute: 0.5 10*3/uL (ref 0.1–1.0)
Monocytes Relative: 22 %
Neutro Abs: 1.3 10*3/uL — ABNORMAL LOW (ref 1.7–7.7)
Neutrophils Relative %: 60 %
Platelets: 100 10*3/uL — ABNORMAL LOW (ref 150–400)
RBC: 2.88 MIL/uL — ABNORMAL LOW (ref 4.22–5.81)
RDW: 18.3 % — ABNORMAL HIGH (ref 11.5–15.5)
WBC: 2.2 10*3/uL — ABNORMAL LOW (ref 4.0–10.5)
nRBC: 0.9 % — ABNORMAL HIGH (ref 0.0–0.2)

## 2022-12-17 LAB — GLUCOSE, CAPILLARY
Glucose-Capillary: 140 mg/dL — ABNORMAL HIGH (ref 70–99)
Glucose-Capillary: 165 mg/dL — ABNORMAL HIGH (ref 70–99)
Glucose-Capillary: 198 mg/dL — ABNORMAL HIGH (ref 70–99)
Glucose-Capillary: 161 mg/dL — ABNORMAL HIGH (ref 70–99)
Glucose-Capillary: 148 mg/dL — ABNORMAL HIGH (ref 70–99)

## 2022-12-17 LAB — PHOSPHORUS: Phosphorus: 4.1 mg/dL (ref 2.5–4.6)

## 2022-12-17 LAB — MAGNESIUM: Magnesium: 2.3 mg/dL (ref 1.7–2.4)

## 2022-12-17 MED ORDER — SODIUM BICARBONATE 650 MG PO TABS
650.0000 mg | ORAL_TABLET | Freq: Three times a day (TID) | ORAL | Status: AC
  Administered 2022-12-17 – 2022-12-19 (×9): 650 mg via ORAL
  Filled 2022-12-17 (×9): qty 1

## 2022-12-17 MED ORDER — HEPARIN SODIUM (PORCINE) 5000 UNIT/ML IJ SOLN
5000.0000 [IU] | Freq: Three times a day (TID) | INTRAMUSCULAR | Status: DC
  Administered 2022-12-17 – 2022-12-29 (×34): 5000 [IU] via SUBCUTANEOUS
  Filled 2022-12-17 (×33): qty 1

## 2022-12-17 NOTE — Progress Notes (Signed)
PROGRESS NOTE    Scott Bass  GNF:[id number] DOB: 1950/07/08 DOA: 12/04/2022 PCP: Tracey Harries, MD   Brief Narrative:  This 72 years old wheelchair-bound male with PMH significant for blindness, recently diagnosed multiple myeloma in May 2024 but has not started therapy yet, hypertension, diabetes mellitus presented s/p fall and found to be confused, tachycardic and constipated with labs significant for AKI with hyperkalemia and severe metabolic acidosis. He was also found to have hemoglobin 3.5, lactic acid 6.0. Patient was initially admitted in the ICU and started on broad-spectrum antibiotics and fluid resuscitation. Patient has received 3 units of packed red blood cells. hemoglobin 7.5. TRH pickup 12/06/2022.  He now agreeable to going to CIR and wants to continue with cancer directed therapies.  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  Not a candidate for hemodialysis given advanced malignancy, comorbidity and functional status. -Avoid Nephrotoxic Medications, Contrast Dyes, Hypotension and Dehydration to Ensure Adequate Renal Perfusion and will need to Renally Adjust Meds -Continue to Monitor and Trend Renal Function carefully and repeat CMP in the AM  -IV fluids discontinued, serum creatinine continued to improve but now slightly worsening  and will need to be monitiored -Good urine output, Foley was removed 8/3.   Multiple Myeloma with Diffuse Pathological fractures Mild thrombocytopenia: -Oncology is consulted recommended Decadron and Velcade. -Palliative consulted to discuss goals of care and he wants to go home but after further  discussion with his wife he is going to go to CIR and continue chemotherapy. -Velcade dose given on 8/8 and then plan is to start weekly dosing as per oncologist -Platelet Count Trend: Recent Labs  Lab 12/09/22 0323 12/10/22 1043 12/13/22 0956 12/14/22 0620 12/15/22 0522 12/16/22 0606 12/17/22 0539  PLT 115* 105* 132* 129* 153 146* 100*  -PT/OT recommending CIR -Palliative care consulted and patient had expressed that he wanted to go home with goals of care readdressed and now the patient wants to continue cancer directed treatments and pursue rehabilitation and he does not want to be comfort care and they are not on board with the hospice philosophy is for now -Will go to CIR once they initiate acceptance     Hyperkalemia: Resolved -Resolved with calcium gluconate, Lasix, insulin and D50. -Now hypokalemia > being replaced. Continue to monitor -K+ Trend: Recent Labs  Lab 12/11/22 0614 12/12/22 0310 12/13/22 0547 12/14/22 0620 12/15/22 0522 12/16/22 0606 12/17/22 0539  K 3.3* 3.1* 3.8 3.6 3.9 3.9 4.1  -Continue to monitor and replete as necessary -Repeat potassium and CMP in a.m.   Hyponatremia:  -IVF discontinued, Continue to monitor. Na+ Trend: Recent Labs  Lab 12/11/22 0614 12/12/22 0310 12/13/22 0547 12/14/22 0620 12/15/22 0522 12/16/22 0606 12/17/22 0539  NA 146* 146* 141 143 134* 136 137    Metabolic Acidosis -S/p sodium bicarbonate infusion. -Started oral bicarbonate and will continue and renewed -Patient has a CO2 of 17, AG of 14, and Chloride Level of 106 -Continue to continue monitor and trend and repeat CMP in a.m.   Lactic acidosis > Improved. -IVF discontinued.  -Lactic Acid Level Trend: Recent Labs  Lab 12/04/22 0529 12/04/22 0819  LATICACIDVEN 6.0* 2.7*  -Continue monitor and  trend and will not repeat lactic acid   Acute anemia likely secondary to multiple myeloma: -S/p 2 unit PRBC -Hgb/Hct Trend: Recent Labs  Lab 12/09/22 0323  12/10/22 1043 12/13/22 0956 12/14/22 0620 12/15/22 0522 12/16/22 0606 12/17/22 0539  HGB 8.1* 8.5* 9.0* 7.9* 8.0* 8.5* 8.4*  HCT 26.3* 28.1* 30.4* 26.4* 25.3* 26.9* 27.5*  MCV 93.9 95.6 97.4 97.1 93.4 91.5 95.5  -Keep hemoglobin above 7. -Continue to monitor for signs and symptoms bleeding; no overt bleeding noted   Tachycardia -Likely multifactorial due to anemia, dehydration, suspected sepsis and agitation and beta-blocker withdrawal. -One of the 4 blood culture showed Staphylococcus Capitis.  Likely contaminant -Continue monitor heart rates carefully and is noted that his beta-blocker was not resume but will resume now -Continue to monitor on telemetry   Large mass involving T4 vertebral body with soft tissue component: -MRI with and without C and T-spine when renal functions further    Acute Metabolic Encephalopathy, improving  -Likely secondary to multiple myeloma, uremia and others. -Minimize sedation,  reorient as above. -Continue Haldol as needed for agitation and restlessness. -Psychiatry consulted for agitation, restlessness.    Constipation -Continue aggressive bowel regimen with senna Swords 15 mL p.o. twice daily, MiraLAX 17 g p.o. daily and daily as needed for moderate constipation, and docusate 100 mg p.o. twice daily as needed for mild constipation   Hypothyroidism -Continue Levothyroxine 150 mcg po Daily .  Glaucoma -Continue eyedrops with latanoprost 1 drop in both eyes nightly, brimonidine 1 drop in both eyes 3 times daily.   -Right eye blind, very poor vision.  Patient can see through left eye   Folic Acid Deficiency, -Started folate supplement   Abnormal LFTs, mild -Mild. LFT Trend: Recent Labs  Lab 12/04/22 0515 12/06/22 0615 12/16/22 0606 12/17/22 0539  AST 35 31 53* 55*  ALT 28 19 51* 53*  BILITOT 1.0 0.9 0.8 0.6  ALKPHOS 62 58 135* 122  -Continue to Monitor and Trend and Likely Reactive -Repeat CMP in the AM   Moderate  Malnutrition Estimated body mass index is 24.66 kg/m as calculated from the following:   Height as of this encounter: 5\' 6"  (1.676 m).   Weight as of this encounter: 69.3 kg. Nutrition Status: Nutrition Problem: Moderate Malnutrition Etiology: chronic illness Signs/Symptoms: mild fat depletion, mild muscle depletion, percent weight loss (28% in 3 months) Percent weight loss: 28 % (in 3 months) Interventions: Refer to RD note for recommendations, Magic cup, Hormel Shake  Hypoalbuminemia -Patient's Albumin Trend: Recent Labs  Lab 12/04/22 0515 12/06/22 0615 12/09/22 0323 12/16/22 0606 12/17/22 0539  ALBUMIN 2.6* 1.9* 2.0* 2.3* 2.3*  -Continue to Monitor and Trend and repeat CMP in the AM   DVT prophylaxis: heparin injection 5,000 Units Start: 12/17/22 1400 SCDs Start: 12/04/22 0948    Code Status: DNR Family Communication: Discussed with wife at bedside  Disposition Plan:  Level of care: Telemetry Status is: Inpatient Remains inpatient appropriate because: Will need to go to CIR soon if bed is available   Consultants:  Medical oncology Palliative care medicine PCCM transfer CIR Nephrology  Procedures:  As delineated above  Antimicrobials:  Anti-infectives (From admission, onward)    Start     Dose/Rate Route Frequency Ordered Stop   12/11/22 1000  fluconazole (DIFLUCAN) tablet 100 mg        100 mg Oral Daily 12/11/22 0844 12/16/22 0959   12/05/22 1000  cefTRIAXone (ROCEPHIN) 2 g in sodium chloride 0.9 % 100 mL IVPB  2 g 200 mL/hr over 30 Minutes Intravenous Every 24 hours 12/04/22 1340 12/08/22 1008   12/04/22 0600  ceFEPIme (MAXIPIME) 2 g in sodium chloride 0.9 % 100 mL IVPB        2 g 200 mL/hr over 30 Minutes Intravenous  Once 12/04/22 0557 12/04/22 0726   12/04/22 0600  metroNIDAZOLE (FLAGYL) IVPB 500 mg        500 mg 100 mL/hr over 60 Minutes Intravenous  Once 12/04/22 0557 12/04/22 0829   12/04/22 0600  vancomycin (VANCOCIN) IVPB 1000 mg/200 mL  premix        1,000 mg 200 mL/hr over 60 Minutes Intravenous  Once 12/04/22 0557 12/04/22 5366       Subjective: Seen and examined at bedside and states that he had a bad night saying that he wanted to go home but after further discussion with the wife again he states that he will go to rehab but wants to know when he is going.  No nausea or vomiting.  No other concerns at present time and denies any pain.  Objective: Vitals:   12/17/22 0756 12/17/22 0756 12/17/22 1300 12/17/22 1917  BP:  121/65 130/67 122/65  Pulse:  85 79 73  Resp:  20 20 18   Temp: 97.9 F (36.6 C)  98 F (36.7 C) 98.1 F (36.7 C)  TempSrc: Oral  Oral Oral  SpO2:  99% 100% 100%  Weight:      Height:        Intake/Output Summary (Last 24 hours) at 12/17/2022 2006 Last data filed at 12/17/2022 1800 Gross per 24 hour  Intake 2071.17 ml  Output 1200 ml  Net 871.17 ml   Filed Weights   12/13/22 0402 12/14/22 0700 12/17/22 0415  Weight: 65.2 kg 64.8 kg 69.3 kg   Examination: Physical Exam:  Constitutional: Chronically ill-appearing African-American male in no acute distress sitting in chair at bedside but does appear little frustrated Respiratory: Diminished to auscultation bilaterally, no wheezing, rales, rhonchi or crackles. Normal respiratory effort and patient is not tachypenic. No accessory muscle use.  Unlabored breathing Cardiovascular: RRR, no murmurs / rubs / gallops. S1 and S2 auscultated. No extremity edema Abdomen: Soft, non-tender, distended secondary to body habitus.  Bowel sounds positive.  GU: Deferred. Musculoskeletal: No clubbing / cyanosis of digits/nails. No joint deformity upper and lower extremities.  Skin: No rashes, lesions, ulcers on limited skin evaluation. No induration; Warm and dry.  Neurologic: He is blind other cranial nerves appear to be intact Psychiatric: Anxious mood  Data Reviewed: I have personally reviewed following labs and imaging studies  CBC: Recent Labs  Lab  12/13/22 0956 12/14/22 0620 12/15/22 0522 12/16/22 0606 12/17/22 0539  WBC 6.1 6.1 8.9 9.0 2.2*  NEUTROABS 4.7 4.7 8.1* 8.0* 1.3*  HGB 9.0* 7.9* 8.0* 8.5* 8.4*  HCT 30.4* 26.4* 25.3* 26.9* 27.5*  MCV 97.4 97.1 93.4 91.5 95.5  PLT 132* 129* 153 146* 100*   Basic Metabolic Panel: Recent Labs  Lab 12/12/22 0310 12/13/22 0547 12/14/22 0620 12/15/22 0522 12/16/22 0606 12/17/22 0539  NA 146* 141 143 134* 136 137  K 3.1* 3.8 3.6 3.9 3.9 4.1  CL 111 108 112* 102 104 106  CO2 23 22 19* 19* 20* 17*  GLUCOSE 139* 124* 101* 129* 177* 176*  BUN 59* 52* 42* 48* 65* 70*  CREATININE 2.34* 2.45* 2.29* 2.42* 2.68* 2.76*  CALCIUM 7.2* 7.5* 7.9* 7.5* 7.7* 7.6*  MG 1.9  --  1.8 1.8 2.4 2.3  PHOS 3.4  --  2.8 4.3 4.2 4.1   GFR: Estimated Creatinine Clearance: 22.2 mL/min (A) (by C-G formula based on SCr of 2.76 mg/dL (H)). Liver Function Tests: Recent Labs  Lab 12/16/22 0606 12/17/22 0539  AST 53* 55*  ALT 51* 53*  ALKPHOS 135* 122  BILITOT 0.8 0.6  PROT 6.8 6.3*  ALBUMIN 2.3* 2.3*   No results for input(s): "LIPASE", "AMYLASE" in the last 168 hours. No results for input(s): "AMMONIA" in the last 168 hours. Coagulation Profile: No results for input(s): "INR", "PROTIME" in the last 168 hours. Cardiac Enzymes: No results for input(s): "CKTOTAL", "CKMB", "CKMBINDEX", "TROPONINI" in the last 168 hours. BNP (last 3 results) No results for input(s): "PROBNP" in the last 8760 hours. HbA1C: No results for input(s): "HGBA1C" in the last 72 hours. CBG: Recent Labs  Lab 12/17/22 0355 12/17/22 0732 12/17/22 1138 12/17/22 1602 12/17/22 1959  GLUCAP 140* 165* 198* 161* 148*   Lipid Profile: No results for input(s): "CHOL", "HDL", "LDLCALC", "TRIG", "CHOLHDL", "LDLDIRECT" in the last 72 hours. Thyroid Function Tests: No results for input(s): "TSH", "T4TOTAL", "FREET4", "T3FREE", "THYROIDAB" in the last 72 hours. Anemia Panel: No results for input(s): "VITAMINB12", "FOLATE",  "FERRITIN", "TIBC", "IRON", "RETICCTPCT" in the last 72 hours. Sepsis Labs: No results for input(s): "PROCALCITON", "LATICACIDVEN" in the last 168 hours.  No results found for this or any previous visit (from the past 240 hour(s)).   Radiology Studies: No results found.  Scheduled Meds:  amLODipine  5 mg Oral Daily   brimonidine  1 drop Both Eyes TID   ferrous sulfate  325 mg Oral QODAY   folic acid  1 mg Oral Daily   heparin injection (subcutaneous)  5,000 Units Subcutaneous Q8H   insulin aspart  0-15 Units Subcutaneous Q4H   latanoprost  1 drop Both Eyes QHS   levothyroxine  150 mcg Oral Q0600   metoprolol tartrate  25 mg Oral BID   pantoprazole  40 mg Oral BID   polyethylene glycol  17 g Oral Daily   sennosides  15 mL Oral BID   sodium bicarbonate  650 mg Oral TID   tamsulosin  0.4 mg Oral Daily   Continuous Infusions:  dextrose 5 % and 0.9 % NaCl with KCl 20 mEq/L 75 mL/hr at 12/17/22 1047    LOS: 13 days   Marguerita Merles, DO [facility] Available via Epic secure chat 7am-7pm After these hours, please refer to coverage provider listed on [email or web address] 12/17/2022, 8:06 PM

## 2022-12-17 NOTE — Plan of Care (Signed)

## 2022-12-18 ENCOUNTER — Inpatient Hospital Stay (HOSPITAL_COMMUNITY): Payer: Medicare PPO

## 2022-12-18 DIAGNOSIS — N170 Acute kidney failure with tubular necrosis: Secondary | ICD-10-CM | POA: Diagnosis not present

## 2022-12-18 DIAGNOSIS — R410 Disorientation, unspecified: Secondary | ICD-10-CM | POA: Diagnosis not present

## 2022-12-18 DIAGNOSIS — C9000 Multiple myeloma not having achieved remission: Secondary | ICD-10-CM | POA: Diagnosis not present

## 2022-12-18 DIAGNOSIS — E872 Acidosis, unspecified: Secondary | ICD-10-CM | POA: Diagnosis not present

## 2022-12-18 LAB — GLUCOSE, CAPILLARY
Glucose-Capillary: 109 mg/dL — ABNORMAL HIGH (ref 70–99)
Glucose-Capillary: 152 mg/dL — ABNORMAL HIGH (ref 70–99)
Glucose-Capillary: 163 mg/dL — ABNORMAL HIGH (ref 70–99)
Glucose-Capillary: 175 mg/dL — ABNORMAL HIGH (ref 70–99)
Glucose-Capillary: 197 mg/dL — ABNORMAL HIGH (ref 70–99)
Glucose-Capillary: 223 mg/dL — ABNORMAL HIGH (ref 70–99)
Glucose-Capillary: 92 mg/dL (ref 70–99)

## 2022-12-18 MED ORDER — BISACODYL 10 MG RE SUPP
10.0000 mg | Freq: Every day | RECTAL | Status: DC | PRN
  Administered 2022-12-18 – 2022-12-19 (×2): 10 mg via RECTAL
  Filled 2022-12-18 (×2): qty 1

## 2022-12-18 MED ORDER — POLYETHYLENE GLYCOL 3350 17 G PO PACK
17.0000 g | PACK | Freq: Two times a day (BID) | ORAL | Status: DC
  Administered 2022-12-18 – 2022-12-21 (×6): 17 g via ORAL
  Filled 2022-12-18 (×6): qty 1

## 2022-12-18 MED ORDER — SENNOSIDES-DOCUSATE SODIUM 8.6-50 MG PO TABS
1.0000 | ORAL_TABLET | Freq: Two times a day (BID) | ORAL | Status: DC
  Administered 2022-12-18 – 2022-12-28 (×17): 1 via ORAL
  Filled 2022-12-18 (×17): qty 1

## 2022-12-18 MED ORDER — ONDANSETRON HCL 4 MG/2ML IJ SOLN
4.0000 mg | Freq: Four times a day (QID) | INTRAMUSCULAR | Status: DC | PRN
  Administered 2022-12-24 (×2): 4 mg via INTRAVENOUS
  Filled 2022-12-18 (×2): qty 2

## 2022-12-18 NOTE — Progress Notes (Signed)
Physical Therapy Treatment Patient Details Name: Scott Bass MRN: [id number] DOB: 07/03/1950 Today's Date: 12/18/2022   History of Present Illness 72 year old male presented for fall and found confused, tachycardic and constipated with labs significant for AKI with hyperkalemia and severe metabolic acidosis and Hg 3.5. LA 6.0;  CTs 12/04/2022-innumerable lytic lesions, large expansile lesion in the anterior right second rib, multiple pathologic rib fractures, expansile T4 (T3 on CT cervical spine) mass with soft tissue component extending into the central canal and left neural foramen, C7 transverse process fracture, displaced fracture of the lower RIGHT scapula, of uncertain age. PHMx: blindness (can see some, but not details), recently diagnosed multiple myeloma in May 2024 but has not started therapy (diffuse lesions) , HTN, DM    PT Comments   The patient participated in mobility and ambulated x 20' with RW. Patient  should be able to progress distance using RW. Patient will benefit from continued inpatient follow up therapy, <3 hours/day    If plan is discharge home, recommend the following: A little help with walking and/or transfers;A little help with bathing/dressing/bathroom;Assistance with cooking/housework;Assist for transportation;Help with stairs or ramp for entrance   Can travel by private vehicle     No  Equipment Recommendations  None recommended by PT    Recommendations for Other Services       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); wife blind also Restrictions Weight Bearing Restrictions: No     Mobility  Bed Mobility   Bed Mobility: Supine to Sit, Sit to Supine     Supine to sit: HOB elevated, Min assist, Used rails Sit to supine: Supervision   General bed mobility comments: provided tactile cues and VC on direction and location,   assist to push to sittin upright and slight support of  legs to return to side    Transfers Overall transfer level: Needs assistance Equipment used: Rolling walker (2 wheels) Transfers: Sit to/from Stand Sit to Stand: Min assist, From elevated surface           General transfer comment: min support to rise at [employer]    Ambulation/Gait Ambulation/Gait assistance: Min assist Gait Distance (Feet): 20 Feet Assistive device: Rolling walker (2 wheels) Gait Pattern/deviations: Step-through pattern       General Gait Details: cues for visual guodance, patient strady  with RW   Stairs             Wheelchair Mobility     Tilt Bed    Modified Rankin (Stroke Patients Only)       Balance Overall balance assessment: Needs assistance Sitting-balance support: No upper extremity supported, Feet supported Sitting balance-Leahy Scale: Good     Standing balance support: Reliant on assistive device for balance Standing balance-Leahy Scale: Fair                              Cognition Arousal: Alert Behavior During Therapy: Flat affect Overall Cognitive Status: Difficult to assess                                 General Comments: follows directions although needed increased time to respond at times.        Exercises      General Comments        Pertinent Vitals/Pain Pain Assessment Pain Assessment: No/denies pain  Home Living                          Prior Function            PT Goals (current goals can now be found in the care plan section) Progress towards PT goals: Progressing toward goals    Frequency    Min 1X/week      PT Plan Discharge plan needs to be updated    Co-evaluation              AM-PAC PT "6 Clicks" Mobility   Outcome Measure  Help needed turning from your back to your side while in a flat bed without using bedrails?: A Little Help needed moving from lying on your back to sitting on the side of a flat bed without using bedrails?: A  Little Help needed moving to and from a bed to a chair (including a wheelchair)?: A Little Help needed standing up from a chair using your arms (e.g., wheelchair or bedside chair)?: A Little Help needed to walk in hospital room?: A Lot Help needed climbing 3-5 steps with a railing? : Total 6 Click Score: 15    End of Session Equipment Utilized During Treatment: Gait belt Activity Tolerance: Patient tolerated treatment well Patient left: in bed;with call bell/phone within reach;with bed alarm set;with nursing/sitter in room Nurse Communication: Mobility status PT Visit Diagnosis: Unsteadiness on feet (R26.81);Other abnormalities of gait and mobility (R26.89);Difficulty in walking, not elsewhere classified (R26.2)     Time: 2706-2376 PT Time Calculation (min) (ACUTE ONLY): 14 min  Charges:    $Gait Training: 8-22 mins PT General Charges $$ ACUTE PT VISIT: 1 Visit                     Blanchard Kelch PT Acute Rehabilitation Services Office [phone] Weekend pager-[phone]    Rada Hay 12/18/2022, 2:21 PM

## 2022-12-18 NOTE — Progress Notes (Signed)
Inpatient Rehab Admissions Coordinator:    I spoke with pt. And his wife over the phone regarding concerns that he may not be able to tolerate the intensity of CIR. They stated that they feel like 3 hours a day would be too much right now but are open to less intensive rehab at [facility]. I will sign off and notify TOC.    Megan Salon, MS, CCC-SLP Rehab Admissions Coordinator  [phone] (celll) [phone] (office)

## 2022-12-18 NOTE — Progress Notes (Signed)
12/18/22 1200  SLP Visit Information  SLP Received On 12/18/22  General Information  Behavior/Cognition Alert;Cooperative;Pleasant mood  Patient Positioning Upright in bed  Oral care provided N/A  HPI 72 year old wheel chair bound male with blindness, recently diagnosed multiple myeloma in May 2024 but not has started therapy, HTN, DM who presented for fall and found confused, tachycardic and constipated with labs significant for AKI with hyperkalemia and severe metabolic acidosis and Hg 3.5. LA 6.0. PCCM consulted for admission. In the ED started on broad spectrum abx and fluid resuscitation; CT chest indicated No acute findings within the chest, abdomen or pelvis. No  evidence of solid organ injury. No hemorrhage or edema within the  mediastinum. No pleural effusion or pneumothorax ; ST consulted for swallow evaluation d/t dysphagia noted with liquids per nursing report.  PT has been maintained on nectar liquid diet and water protocol - he demonstrated some vomiting with po which is not always coorelated to po intake per pt.   Reeval ordered.  Treatment Provided  Treatment provided Dysphagia  Dysphagia Treatment  Temperature Spikes Noted No  Respiratory Status Room air  Oral Cavity - Dentition Adequate natural dentition  Treatment Methods Skilled observation  Patient observed directly with PO's Yes  Type of PO's observed Dysphagia 1 (puree);Nectar-thick liquids;Thin liquids (pt refused any other intake including meat loaf and brocolli)  Feeding Fed self  Liquids provided via Straw;Cup;Teaspoon  Pharyngeal Phase Signs & Symptoms Delayed cough;Immediate cough  Type of cueing Verbal;Tactile;Visual  Amount of cueing Moderate  Family/Caregiver Educated wife  Pain Assessment  Pain Assessment No/denies pain  SLP - End of Session  Patient left in bed;with call bell/phone within reach  Nurse Communication Aspiration precautions reviewed;Diet recommendation;Swallow strategies reviewed   Assessment / Recommendations / Plan  Plan Continue with current plan of care;MBS (MBS when pt able to be cleared due to his lack of bowel movements)  Dysphagia Recommendations  Diet recommendations Dysphagia 3 (mechanical soft);Thin liquid (per pt wishes - wife agrees)  Liquids provided via Cup;Straw;Teaspoon  Medication Administration Whole meds with puree  Supervision Intermittent supervision to cue for compensatory strategies  Compensations Slow rate;Small sips/bites;Use straw to facilitate chin tuck;Chin tuck  Postural Changes and/or Swallow Maneuvers Seated upright 90 degrees;Upright 30-60 min after meal  General Recommendations  Oral Care Recommendations Oral care BID  Follow Up Recommendations Follow physician's recommendations for discharge plan and follow up therapies  Assistance recommended at discharge Frequent or constant Supervision/Assistance  SLP Visit Diagnosis Dysphagia, oropharyngeal phase (R13.12)  Progression Toward Goals  Progression toward goals Progressing toward goals  SLP Time Calculation  SLP Start Time (ACUTE ONLY) 1035  SLP Stop Time (ACUTE ONLY) 1101  SLP Time Calculation (min) (ACUTE ONLY) 26 min  SLP Evaluations  $ SLP Speech Visit 1 Visit  SLP Evaluations  $Swallowing Treatment 1 Procedure    Pt seen today as evaluation was reordered due to pt having nausea/vomiting - -  His intake has been minimal per his wife and RN.  Pt given nectar thick Ginger Ale - and as soon as bolus reached oral cavity, he immediately stated "I don't like this, it's soupy" - declining to drink any of it.    Pt observed with thin Ginger ale via straw with chin tuck posture - This posture prevented cough with approximately 75% of opportunities with SMALL SINGLE SIPS. Pt required Mod cues to conduct strategy accurately. SLP noted pt to have baseline cough today - prior to any po intake.  No clinical indication of aspiration with few boluses of mashed potatoes and peaches that  pt accepted - he did not eat any more than a few bites - stating he is not hungry.  Reviewed importance of pt consuming po intake to help with nutritional support and to improve - but he states he is not hungry.       Pt endorses nausea at home when he is constipated - which may contribute to his current nausea.  He states he has vomited in the hospital four times, sometimes not coorelated to medication or po.    RN reports pt has not had a bowel movement and his bowel sounds are hypoactive,  thus suppository ordered - SLP palpated abdomen and pt denied discomfort.     At this time to maximize hydration and QOL, recommend to advance diet to Dys3/thin - implementing chin tuck posture with small boluses and conduct MBS later this week after pt has had BM. Medications whole please.    Patient, wife and RN informed of recommendations and all agreeable to plan.  Await approval from MD.    Rolena Infante, MS [facility] SLP Acute Rehab Services Office [phone]

## 2022-12-18 NOTE — [place]2 (Signed)
[place] MEDICAID FL2 LEVEL OF CARE FORM     IDENTIFICATION  Patient Name: Scott Bass Birthdate: 1950-05-29 Sex: male Admission Date (Current Location): 12/04/2022  [facility] and [place] Number:  Producer, television/film/video and Address:  [facility],  [address] [place], [place] [address]      Provider Number: [id number]  Attending Physician Name and Address:  Merlene Laughter, DO  Relative Name and Phone Number:  Romir Beougher [phone]    Current Level of Care: Hospital Recommended Level of Care: Skilled Nursing Facility Prior Approval Number:    Date Approved/Denied:   PASRR Number: [id number]  Discharge Plan: SNF    Current Diagnoses: Patient Active Problem List   Diagnosis Date Noted   Absolute anemia 12/16/2022   Malnutrition of moderate degree 12/09/2022   DNR (do not resuscitate) 12/07/2022   Acute delirium 12/07/2022   Need for emotional support 12/06/2022   Palliative care encounter 12/05/2022   Lactic acidosis 12/05/2022   Counseling and coordination of care 12/05/2022   Goals of care, counseling/discussion 12/05/2022   Acute renal failure (HCC) 12/05/2022   Acute on chronic renal failure (HCC) 12/04/2022   Multiple myeloma without remission (HCC) 10/11/2022   Counseling regarding advance care planning and goals of care 10/11/2022   Hypercalcemia 09/25/2022   Claudication (HCC) 07/15/2018   Nonischemic cardiomyopathy (HCC) 07/15/2018   Abnormal stress test 12/17/2017   Frequent PVCs 12/17/2017   Pancreatitis 02/17/2015   Diabetes mellitus type 2, controlled (HCC) 02/17/2015   Essential hypertension 02/17/2015   Hypothyroidism 03/28/2007   HYPERLIPIDEMIA 03/28/2007   OBSTRUCTIVE SLEEP APNEA 03/28/2007    Orientation RESPIRATION BLADDER Height & Weight     Self, Time, Situation, Place  Normal Incontinent Weight: 152 lb 12.5 oz (69.3 kg) Height:  5\' 6"  (167.6 cm)  BEHAVIORAL SYMPTOMS/MOOD NEUROLOGICAL BOWEL NUTRITION STATUS       Continent Diet (See discharge summary)  AMBULATORY STATUS COMMUNICATION OF NEEDS Skin   Extensive Assist Verbally Normal                       Personal Care Assistance Level of Assistance  Bathing, Feeding, Dressing Bathing Assistance: Limited assistance Feeding assistance: Limited assistance Dressing Assistance: Limited assistance     Functional Limitations Info  Sight, Hearing, Speech Sight Info: Impaired (Legally blind) Hearing Info: Adequate Speech Info: Adequate    SPECIAL CARE FACTORS FREQUENCY  PT (By licensed PT), OT (By licensed OT)     PT Frequency: 5x/wk OT Frequency: 5x/wk            Contractures Contractures Info: Not present    Additional Factors Info  Code Status, Allergies Code Status Info: DNR Allergies Info: Hctz (Hydrochlorothiazide)           Current Medications (12/18/2022):  This is the current hospital active medication list Current Facility-Administered Medications  Medication Dose Route Frequency Provider Last Rate Last Admin   amLODipine (NORVASC) tablet 5 mg  5 mg Oral Daily Willeen Niece, MD   5 mg at 12/16/22 0905   bisacodyl (DULCOLAX) suppository 10 mg  10 mg Rectal Daily PRN Marguerita Merles Latif, DO   10 mg at 12/18/22 1226   brimonidine (ALPHAGAN) 0.2 % ophthalmic solution 1 drop  1 drop Both Eyes TID Simonne Martinet, NP   1 drop at 12/18/22 1045   dextrose 5 % and 0.9 % NaCl with KCl 20 mEq/L infusion   Intravenous Continuous Sheikh, Omair Latif, DO 75 mL/hr at  12/18/22 0028 New Bag at 12/18/22 0028   docusate sodium (COLACE) capsule 100 mg  100 mg Oral BID PRN Simonne Martinet, NP   100 mg at 12/04/22 2316   ferrous sulfate tablet 325 mg  325 mg Oral Drake Leach, NP   325 mg at 12/18/22 1022   folic acid (FOLVITE) tablet 1 mg  1 mg Oral Daily Gillis Santa, MD   1 mg at 12/18/22 1022   haloperidol lactate (HALDOL) injection 5 mg  5 mg Intravenous Q8H PRN Willeen Niece, MD   5 mg at 12/15/22 0835   heparin  injection 5,000 Units  5,000 Units Subcutaneous [address] [place], DO   5,000 Units at 12/18/22 0545   hydrALAZINE (APRESOLINE) injection 10 mg  10 mg Intravenous Q6H PRN Willeen Niece, MD   10 mg at 12/06/22 1057   HYDROmorphone (DILAUDID) injection 0.5 mg  0.5 mg Intravenous Q4H PRN Thornton Papas B, MD   0.5 mg at 12/12/22 0154   insulin aspart (novoLOG) injection 0-15 Units  0-15 Units Subcutaneous Q4H Simonne Martinet, NP   5 Units at 12/18/22 1225   latanoprost (XALATAN) 0.005 % ophthalmic solution 1 drop  1 drop Both Eyes QHS Simonne Martinet, NP   1 drop at 12/17/22 2207   levothyroxine (SYNTHROID) tablet 150 mcg  150 mcg Oral Q0600 Luciano Cutter, MD   150 mcg at 12/18/22 0545   metoprolol tartrate (LOPRESSOR) tablet 25 mg  25 mg Oral BID Marguerita Merles [place], DO   25 mg at 12/17/22 2206   ondansetron (ZOFRAN) injection 4 mg  4 mg Intravenous Q6H PRN Luiz Iron, NP       ondansetron [facility]) tablet 8 mg  8 mg Oral Q8H PRN Simonne Martinet, NP       Oral care mouth rinse  15 mL Mouth Rinse PRN Luciano Cutter, MD       pantoprazole (PROTONIX) EC tablet 40 mg  40 mg Oral BID Hessie Knows, RPH   40 mg at 12/18/22 1020   phenol (CHLORASEPTIC) mouth spray 1 spray  1 spray Mouth/Throat PRN Simonne Martinet, NP   1 spray at 12/04/22 1531   polyethylene glycol (MIRALAX / GLYCOLAX) packet 17 g  17 g Oral Daily Simonne Martinet, NP   17 g at 12/18/22 1022   polyethylene glycol (MIRALAX / GLYCOLAX) packet 17 g  17 g Oral Daily PRN Simonne Martinet, NP   17 g at 12/10/22 1607   sennosides (SENOKOT) 8.8 MG/5ML syrup 15 mL  15 mL Oral BID Simonne Martinet, NP   15 mL at 12/18/22 1022   simethicone (MYLICON) 40 MG/0.6ML suspension 40 mg  40 mg Oral QID PRN Simonne Martinet, NP       sodium bicarbonate tablet 650 mg  650 mg Oral TID Sheikh, Omair Latif, DO   650 mg at 12/18/22 1020   tamsulosin (FLOMAX) capsule 0.4 mg  0.4 mg Oral Daily Simonne Martinet, NP   0.4 mg at 12/18/22 1022      Discharge Medications: Please see discharge summary for a list of discharge medications.  Relevant Imaging Results:  Relevant Lab Results:   Additional Information SSN: [id number]  Otelia Santee, LCSW

## 2022-12-18 NOTE — Progress Notes (Signed)
PROGRESS NOTE    [name]  MWN:[id number] DOB: February 28, 1951 DOA: 12/04/2022 PCP: Tracey Harries, MD   Brief Narrative:  This 72 years old wheelchair-bound male with PMH significant for blindness, recently diagnosed multiple myeloma in May 2024 but has not started therapy yet, hypertension, diabetes mellitus presented s/p fall and found to be confused, tachycardic and constipated with labs significant for AKI with hyperkalemia and severe metabolic acidosis. He was also found to have hemoglobin 3.5, lactic acid 6.0. Patient was initially admitted in the ICU and started on broad-spectrum antibiotics and fluid resuscitation. Patient has received 3 units of packed red blood cells. hemoglobin 7.5. TRH pickup 12/06/2022.  He now agreeable to going to CIR and wants to continue with cancer directed therapies but he may not be able to tolerate all the therapies for CIR and so will now go to SNF.  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  Not a candidate for hemodialysis given advanced malignancy, comorbidity and functional status. -Avoid Nephrotoxic Medications, Contrast Dyes, Hypotension and Dehydration to Ensure Adequate Renal Perfusion and will need to Renally Adjust Meds -Continue to Monitor and Trend Renal Function carefully and repeat CMP in the AM  -IV fluids now resumed given poor po intake -Good urine output, Foley was removed 8/3.   Multiple Myeloma with Diffuse Pathological Fractures Mild thrombocytopenia: -Oncology is consulted recommended Decadron and Velcade. -Palliative consulted to discuss goals of care and he wants to go home but after  further discussion with his wife he is going to go to CIR and continue chemotherapy. -Velcade dose given on 8/8 and then plan is to start weekly dosing as per oncologist -Platelet Count Trend: Recent Labs  Lab 12/10/22 1043 12/13/22 0956 12/14/22 0620 12/15/22 0522 12/16/22 0606 12/17/22 0539 12/18/22 0539  PLT 105* 132* 129* 153 146* 100* 94*  -PT/OT recommending CIR -Palliative care consulted and patient had expressed that he wanted to go home with goals of care readdressed and now the patient wants to continue cancer directed treatments and pursue rehabilitation and he does not want to be comfort care and they are not on board with the hospice philosophy is for now -Will go to CIR once they initiate acceptance however now they don't think he will be able to tolerate 3 hours a day and are now recommending SNF    Hyperkalemia: Resolved -Resolved with calcium gluconate, Lasix, insulin and D50. -Now hypokalemia > being replaced. Continue to monitor -K+ Trend: Recent Labs  Lab 12/12/22 0310 12/13/22 0547 12/14/22 0620 12/15/22 0522 12/16/22 0606 12/17/22 0539 12/18/22 0539  K 3.1* 3.8 3.6 3.9 3.9 4.1 3.8  -Continue to monitor and replete as necessary -Repeat potassium and CMP in a.m.   Hyponatremia:  -IVF discontinued, Continue to monitor. -Na+ Trend: Recent Labs  Lab 12/12/22 0310 12/13/22 0547 12/14/22 0620 12/15/22 0522 12/16/22 0606 12/17/22 0539 12/18/22 0539  NA 146* 141 143 134* 136 137 141    Metabolic Acidosis -S/p sodium bicarbonate infusion. -Started oral bicarbonate and will continue and renewed -Patient has a CO2 of 18, AG of 10, and Chloride Level of 113 -Continuing IVF as above  -Continue to continue monitor and trend and  repeat CMP in a.m.   Lactic acidosis > Improved. -IVF discontinued.  -Lactic Acid Level Trend: Recent Labs  Lab 12/04/22 0529 12/04/22 0819  LATICACIDVEN 6.0* 2.7*  -Continue monitor and trend and will not repeat lactic  acid -Getting IVF with D5 NS + 20 mEQ    Acute Anemia likely Secondary to Multiple Myeloma: -S/p 2 unit PRBC -Hgb/Hct Trend: Recent Labs  Lab 12/10/22 1043 12/13/22 0956 12/14/22 0620 12/15/22 0522 12/16/22 0606 12/17/22 0539 12/18/22 0539  HGB 8.5* 9.0* 7.9* 8.0* 8.5* 8.4* 7.6*  HCT 28.1* 30.4* 26.4* 25.3* 26.9* 27.5* 25.1*  MCV 95.6 97.4 97.1 93.4 91.5 95.5 95.1  -Keep hemoglobin above 7. -Continue to monitor for signs and symptoms bleeding; no overt bleeding noted   Tachycardia -Likely multifactorial due to anemia, dehydration, suspected sepsis and agitation and beta-blocker withdrawal. -One of the 4 blood culture showed Staphylococcus Capitis.  Likely contaminant -Getting IVF as above -Continue monitor heart rates carefully and is noted that his beta-blocker was not resume but will resume now -Continue to monitor on telemetry   Large mass involving T4 vertebral body with soft tissue component: -MRI with and without C and T-spine when renal functions further    Acute Metabolic Encephalopathy, improving  -Likely secondary to multiple myeloma, uremia and others. -Minimize sedation,  reorient as above. -Continue Haldol as needed for agitation and restlessness. -Psychiatry consulted for agitation, restlessness.  -SLP now recommending Dysphagia 3   Constipation -Continue aggressive bowel regimen and adjusted now   Hypothyroidism -Continue Levothyroxine 150 mcg po Daily .  Glaucoma -Continue Eyedrops with latanoprost 1 drop in both eyes nightly, brimonidine 1 drop in both eyes 3 times daily.   -Right eye blind, very poor vision.  Patient can see through left eye   Folic Acid Deficiency, -Started folate supplement   Abnormal LFTs, mild -Mild. LFT Trend: Recent Labs  Lab 12/04/22 0515 12/06/22 0615 12/16/22 0606 12/17/22 0539 12/18/22 0539  AST 35 31 53* 55* 31  ALT 28 19 51* 53* 36  BILITOT 1.0 0.9 0.8 0.6 0.5  ALKPHOS 62 58 135* 122 109  -Continue to  Monitor and Trend and Likely Reactive -Repeat CMP in the AM   Moderate Malnutrition with Poor Po Intake Estimated body mass index is 24.66 kg/m as calculated from the following:   Height as of this encounter: 5\' 6"  (1.676 m).   Weight as of this encounter: 69.3 kg. Nutrition Status: Nutrition Problem: Moderate Malnutrition Etiology: chronic illness Signs/Symptoms: mild fat depletion, mild muscle depletion, percent weight loss (28% in 3 months) Percent weight loss: 28 % (in 3 months) Interventions: Refer to RD note for recommendations, Magic cup, Hormel Shake  Hypoalbuminemia -Patient's Albumin Trend: Recent Labs  Lab 12/04/22 0515 12/06/22 0615 12/09/22 0323 12/16/22 0606 12/17/22 0539 12/18/22 0539  ALBUMIN 2.6* 1.9* 2.0* 2.3* 2.3* 2.1*  -Continue to Monitor and Trend and repeat CMP in the AM  DVT prophylaxis: heparin injection 5,000 Units Start: 12/17/22 1400 SCDs Start: 12/04/22 0948    Code Status: DNR Family Communication: Discussed with Wife at Bedside   Disposition Plan:  Level of care: Telemetry Status is: Inpatient Remains inpatient appropriate because: Needs a Safe Discharge Dispositon to SNF now that CIR has declined   Consultants:  Medical oncology Palliative care medicine PCCM transfer CIR Nephrology  Procedures:  As delineated as above  Antimicrobials:  Anti-infectives (From admission, onward)    Start     Dose/Rate Route Frequency Ordered Stop   12/11/22 1000  fluconazole (DIFLUCAN)  tablet 100 mg        100 mg Oral Daily 12/11/22 0844 12/16/22 0959   12/05/22 1000  cefTRIAXone (ROCEPHIN) 2 g in sodium chloride 0.9 % 100 mL IVPB        2 g 200 mL/hr over 30 Minutes Intravenous Every 24 hours 12/04/22 1340 12/08/22 1008   12/04/22 0600  ceFEPIme (MAXIPIME) 2 g in sodium chloride 0.9 % 100 mL IVPB        2 g 200 mL/hr over 30 Minutes Intravenous  Once 12/04/22 0557 12/04/22 0726   12/04/22 0600  metroNIDAZOLE (FLAGYL) IVPB 500 mg        500  mg 100 mL/hr over 60 Minutes Intravenous  Once 12/04/22 0557 12/04/22 0829   12/04/22 0600  vancomycin (VANCOCIN) IVPB 1000 mg/200 mL premix        1,000 mg 200 mL/hr over 60 Minutes Intravenous  Once 12/04/22 0557 12/04/22 0918       Subjective: Seen and examined at bedside and he endorses nausea send just vomited.  Also states he has not had a bowel movement and has some abdominal discomfort.  States that he did not feel good today.  No other concerns or complaints this time.  Objective: Vitals:   12/17/22 1917 12/18/22 0604 12/18/22 0843 12/18/22 1623  BP: 122/65 120/62 (!) 120/51 117/68  Pulse: 73 90 65 88  Resp: 18 18 14 13   Temp: 98.1 F (36.7 C) 98.8 F (37.1 C) 98.1 F (36.7 C) 98.2 F (36.8 C)  TempSrc: Oral Oral Oral Oral  SpO2: 100% 100% 99% 100%  Weight:      Height:        Intake/Output Summary (Last 24 hours) at 12/18/2022 1943 Last data filed at 12/18/2022 0900 Gross per 24 hour  Intake 150 ml  Output 1250 ml  Net -1100 ml   Filed Weights   12/13/22 0402 12/14/22 0700 12/17/22 0415  Weight: 65.2 kg 64.8 kg 69.3 kg   Examination: Physical Exam:  Constitutional: Chronically ill-appearing AAM who appears a little uncomfortable Respiratory: Diminished to auscultation bilaterally, no wheezing, rales, rhonchi or crackles. Normal respiratory effort and patient is not tachypenic. Unlabored breathing  Cardiovascular: RRR, no murmurs / rubs / gallops. S1 and S2 auscultated. No extremity edema.  Abdomen: Soft, non-tender, non-distended.. Bowel sounds positive.  GU: Deferred. Musculoskeletal: No clubbing / cyanosis of digits/nails. No joint deformity upper and lower extremities.   Skin: No rashes, lesions, ulcers on a limited skin evaluation. No induration; Warm and dry.  Neurologic: He is blind but other CN appear intack  Psychiatric: Anxious  Data Reviewed: I have personally reviewed following labs and imaging studies  CBC: Recent Labs  Lab 12/14/22 0620  12/15/22 0522 12/16/22 0606 12/17/22 0539 12/18/22 0539  WBC 6.1 8.9 9.0 2.2* 2.2*  NEUTROABS 4.7 8.1* 8.0* 1.3* 1.1*  HGB 7.9* 8.0* 8.5* 8.4* 7.6*  HCT 26.4* 25.3* 26.9* 27.5* 25.1*  MCV 97.1 93.4 91.5 95.5 95.1  PLT 129* 153 146* 100* 94*   Basic Metabolic Panel: Recent Labs  Lab 12/14/22 0620 12/15/22 0522 12/16/22 0606 12/17/22 0539 12/18/22 0539  NA 143 134* 136 137 141  K 3.6 3.9 3.9 4.1 3.8  CL 112* 102 104 106 113*  CO2 19* 19* 20* 17* 18*  GLUCOSE 101* 129* 177* 176* 141*  BUN 42* 48* 65* 70* 52*  CREATININE 2.29* 2.42* 2.68* 2.76* 2.29*  CALCIUM 7.9* 7.5* 7.7* 7.6* 7.8*  MG 1.8 1.8 2.4 2.3 2.0  PHOS  2.8 4.3 4.2 4.1 2.8   GFR: Estimated Creatinine Clearance: 26.7 mL/min (A) (by C-G formula based on SCr of 2.29 mg/dL (H)). Liver Function Tests: Recent Labs  Lab 12/16/22 0606 12/17/22 0539 12/18/22 0539  AST 53* 55* 31  ALT 51* 53* 36  ALKPHOS 135* 122 109  BILITOT 0.8 0.6 0.5  PROT 6.8 6.3* 5.5*  ALBUMIN 2.3* 2.3* 2.1*   No results for input(s): "LIPASE", "AMYLASE" in the last 168 hours. No results for input(s): "AMMONIA" in the last 168 hours. Coagulation Profile: No results for input(s): "INR", "PROTIME" in the last 168 hours. Cardiac Enzymes: No results for input(s): "CKTOTAL", "CKMB", "CKMBINDEX", "TROPONINI" in the last 168 hours. BNP (last 3 results) No results for input(s): "PROBNP" in the last 8760 hours. HbA1C: No results for input(s): "HGBA1C" in the last 72 hours. CBG: Recent Labs  Lab 12/18/22 0011 12/18/22 0416 12/18/22 0840 12/18/22 1211 12/18/22 1620  GLUCAP 163* 109* 175* 223* 197*   Lipid Profile: No results for input(s): "CHOL", "HDL", "LDLCALC", "TRIG", "CHOLHDL", "LDLDIRECT" in the last 72 hours. Thyroid Function Tests: No results for input(s): "TSH", "T4TOTAL", "FREET4", "T3FREE", "THYROIDAB" in the last 72 hours. Anemia Panel: No results for input(s): "VITAMINB12", "FOLATE", "FERRITIN", "TIBC", "IRON", "RETICCTPCT"  in the last 72 hours. Sepsis Labs: No results for input(s): "PROCALCITON", "LATICACIDVEN" in the last 168 hours.  No results found for this or any previous visit (from the past 240 hour(s)).   Radiology Studies: No results found.  Scheduled Meds:  amLODipine  5 mg Oral Daily   brimonidine  1 drop Both Eyes TID   ferrous sulfate  325 mg Oral QODAY   folic acid  1 mg Oral Daily   heparin injection (subcutaneous)  5,000 Units Subcutaneous Q8H   insulin aspart  0-15 Units Subcutaneous Q4H   latanoprost  1 drop Both Eyes QHS   levothyroxine  150 mcg Oral Q0600   metoprolol tartrate  25 mg Oral BID   pantoprazole  40 mg Oral BID   polyethylene glycol  17 g Oral BID   senna-docusate  1 tablet Oral BID   sodium bicarbonate  650 mg Oral TID   tamsulosin  0.4 mg Oral Daily   Continuous Infusions:  dextrose 5 % and 0.9 % NaCl with KCl 20 mEq/L 75 mL/hr at 12/18/22 1406    LOS: 14 days   Marguerita Merles, DO [facility] Available via Epic secure chat 7am-7pm After these hours, please refer to coverage provider listed on [email or web address] 12/18/2022, 7:43 PM

## 2022-12-18 NOTE — TOC Progression Note (Signed)
Transition of Care [facility]) - Progression Note    Patient Details  Name: Scott Bass MRN: [id number] Date of Birth: 1950-09-20  [facility]) CM/SW Contact  Otelia Santee, LCSW Phone Number: 12/18/2022, 1:07 PM  Clinical Narrative:    Met with pt and spouse at bedside to discuss plan for SNF placement vs CIR. Pt/spouse agreeable to SNF plan as pt is not likely to be able to handle 3/hrs a day of therapy at CIR.  Pt has not been to SNF before and currently does not have a facility preference.  Referrals have been sent out and currently awaiting bed offers.    Expected Discharge Plan: IP Rehab Facility Barriers to Discharge: Continued Medical Work up  Expected Discharge Plan and Services In-house Referral: Clinical Social Work     Living arrangements for the past 2 months: Single Family Home                                       Social Determinants of Health (SDOH) Interventions SDOH Screenings   Food Insecurity: Food Insecurity Present (12/06/2022)  Housing: Low Risk  (12/06/2022)  Transportation Needs: No Transportation Needs (12/06/2022)  Utilities: Not At Risk (12/06/2022)  Financial Resource Strain: Low Risk  (10/10/2022)   Received from [facility], [facility]  Physical Activity: Insufficiently Active (12/23/2020)   Received from [facility], [facility]  Social Connections: Unknown (09/16/2021)   Received from [facility], [facility]  Stress: No Stress Concern Present (12/23/2020)   Received from [facility], [facility]  Tobacco Use: Low Risk  (12/06/2022)    Readmission Risk Interventions    12/07/2022   11:22 AM  Readmission Risk Prevention Plan  HRI or Home Care Consult Complete  SW Recovery Care/Counseling Consult Complete

## 2022-12-18 NOTE — Plan of Care (Signed)

## 2022-12-19 ENCOUNTER — Inpatient Hospital Stay (HOSPITAL_COMMUNITY): Payer: Medicare PPO

## 2022-12-19 DIAGNOSIS — E872 Acidosis, unspecified: Secondary | ICD-10-CM | POA: Diagnosis not present

## 2022-12-19 DIAGNOSIS — R410 Disorientation, unspecified: Secondary | ICD-10-CM | POA: Diagnosis not present

## 2022-12-19 DIAGNOSIS — D61818 Other pancytopenia: Secondary | ICD-10-CM

## 2022-12-19 DIAGNOSIS — N170 Acute kidney failure with tubular necrosis: Secondary | ICD-10-CM | POA: Diagnosis not present

## 2022-12-19 DIAGNOSIS — C9000 Multiple myeloma not having achieved remission: Secondary | ICD-10-CM | POA: Diagnosis not present

## 2022-12-19 LAB — COMPREHENSIVE METABOLIC PANEL
ALT: 31 U/L (ref 0–44)
AST: 29 U/L (ref 15–41)
Albumin: 2.1 g/dL — ABNORMAL LOW (ref 3.5–5.0)
Alkaline Phosphatase: 116 U/L (ref 38–126)
Anion gap: 7 (ref 5–15)
BUN: 36 mg/dL — ABNORMAL HIGH (ref 8–23)
CO2: 18 mmol/L — ABNORMAL LOW (ref 22–32)
Calcium: 7.7 mg/dL — ABNORMAL LOW (ref 8.9–10.3)
Chloride: 116 mmol/L — ABNORMAL HIGH (ref 98–111)
Creatinine, Ser: 2.12 mg/dL — ABNORMAL HIGH (ref 0.61–1.24)
GFR, Estimated: 33 mL/min — ABNORMAL LOW (ref >60)
Glucose, Bld: 205 mg/dL — ABNORMAL HIGH (ref 70–99)
Potassium: 4.2 mmol/L (ref 3.5–5.1)
Sodium: 141 mmol/L (ref 135–145)
Total Bilirubin: 0.7 mg/dL (ref 0.3–1.2)
Total Protein: 5.8 g/dL — ABNORMAL LOW (ref 6.5–8.1)

## 2022-12-19 LAB — GLUCOSE, CAPILLARY
Glucose-Capillary: 109 mg/dL — ABNORMAL HIGH (ref 70–99)
Glucose-Capillary: 169 mg/dL — ABNORMAL HIGH (ref 70–99)
Glucose-Capillary: 193 mg/dL — ABNORMAL HIGH (ref 70–99)
Glucose-Capillary: 158 mg/dL — ABNORMAL HIGH (ref 70–99)
Glucose-Capillary: 85 mg/dL (ref 70–99)

## 2022-12-19 LAB — CBC WITH DIFFERENTIAL/PLATELET
Abs Immature Granulocytes: 0.05 10*3/uL (ref 0.00–0.07)
Basophils Absolute: 0 10*3/uL (ref 0.0–0.1)
Basophils Relative: 0 %
Eosinophils Absolute: 0 10*3/uL (ref 0.0–0.5)
Eosinophils Relative: 1 %
HCT: 24.4 % — ABNORMAL LOW (ref 39.0–52.0)
Hemoglobin: 7.5 g/dL — ABNORMAL LOW (ref 13.0–17.0)
Immature Granulocytes: 2 %
Lymphocytes Relative: 15 %
Lymphs Abs: 0.5 10*3/uL — ABNORMAL LOW (ref 0.7–4.0)
MCH: 29 pg (ref 26.0–34.0)
MCHC: 30.7 g/dL (ref 30.0–36.0)
MCV: 94.2 fL (ref 80.0–100.0)
Monocytes Absolute: 0.4 10*3/uL (ref 0.1–1.0)
Monocytes Relative: 13 %
Neutro Abs: 2.3 10*3/uL (ref 1.7–7.7)
Neutrophils Relative %: 69 %
Platelets: 100 10*3/uL — ABNORMAL LOW (ref 150–400)
RBC: 2.59 MIL/uL — ABNORMAL LOW (ref 4.22–5.81)
RDW: 18.7 % — ABNORMAL HIGH (ref 11.5–15.5)
WBC: 3.3 10*3/uL — ABNORMAL LOW (ref 4.0–10.5)
nRBC: 0.6 % — ABNORMAL HIGH (ref 0.0–0.2)

## 2022-12-19 LAB — MAGNESIUM: Magnesium: 1.7 mg/dL (ref 1.7–2.4)

## 2022-12-19 LAB — PHOSPHORUS: Phosphorus: 1.9 mg/dL — ABNORMAL LOW (ref 2.5–4.6)

## 2022-12-19 MED ORDER — KCL IN DEXTROSE-NACL 20-5-0.9 MEQ/L-%-% IV SOLN
INTRAVENOUS | Status: AC
  Filled 2022-12-19 (×2): qty 1000

## 2022-12-19 MED ORDER — SORBITOL 70 % SOLN
960.0000 mL | TOPICAL_OIL | Freq: Once | ORAL | Status: AC
  Administered 2022-12-19: 960 mL via RECTAL
  Filled 2022-12-19: qty 240

## 2022-12-19 NOTE — Progress Notes (Signed)
Mobility Specialist - Progress Note   12/19/22 0902  Mobility  Activity Transferred from chair to bed  Level of Assistance Contact guard assist, steadying assist  Assistive Device Front wheel walker  Distance Ambulated (ft) 6 ft  Range of Motion/Exercises Active  Activity Response Tolerated well  Mobility Referral Yes  $Mobility charge 1 Mobility  Mobility Specialist Start Time (ACUTE ONLY) 0850  Mobility Specialist Stop Time (ACUTE ONLY) 0900  Mobility Specialist Time Calculation (min) (ACUTE ONLY) 10 min   Pt received in bed and agreed to mobility. Had no issues throughout session, returned to bed with all needs met.  Marilynne Halsted Mobility Specialist

## 2022-12-19 NOTE — TOC Progression Note (Signed)
Transition of Care [facility]) - Progression Note    Patient Details  Name: Scott Bass MRN: [id number] Date of Birth: 1951/01/25  Transition of Care [facility]) CM/SW Contact  Otelia Santee, LCSW Phone Number: 12/19/2022, 11:42 AM  Clinical Narrative:    Reviewed bed offers with pt and spouse. Pt has accepted SNF offer for [facility].  Insurance auth requested and approved. Insurance auth start date 8/13 and is valid thru 8/15. Auth ID: [id number]. Pt to transfer once medically ready.    Expected Discharge Plan: IP Rehab Facility Barriers to Discharge: Continued Medical Work up  Expected Discharge Plan and Services In-house Referral: Clinical Social Work     Living arrangements for the past 2 months: Single Family Home                                       Social Determinants of Health (SDOH) Interventions SDOH Screenings   Food Insecurity: Food Insecurity Present (12/06/2022)  Housing: Low Risk  (12/06/2022)  Transportation Needs: No Transportation Needs (12/06/2022)  Utilities: Not At Risk (12/06/2022)  Financial Resource Strain: Low Risk  (10/10/2022)   Received from [facility], [facility]  Physical Activity: Insufficiently Active (12/23/2020)   Received from [facility], [facility]  Social Connections: Unknown (09/16/2021)   Received from [facility], [facility]  Stress: No Stress Concern Present (12/23/2020)   Received from [facility], [facility]  Tobacco Use: Low Risk  (12/06/2022)    Readmission Risk Interventions    12/19/2022   11:42 AM 12/07/2022   11:22 AM  Readmission Risk Prevention Plan  Transportation Screening Complete   Medication Review (RN Care Manager) Complete   PCP or Specialist appointment within 3-5 days of discharge Complete   HRI or Home Care Consult Complete Complete  SW Recovery Care/Counseling Consult Complete Complete  Palliative Care Screening Not Applicable   Skilled Nursing Facility Complete

## 2022-12-19 NOTE — Progress Notes (Signed)
  Daily Progress Note   Patient Name: Scott Bass       Date: 12/19/2022 DOB: [birth year]  Age: 72 y.o. MRN#: [id number] Attending Physician: Merlene Laughter, DO Primary Care Physician: Tracey Harries, MD Admit Date: 12/04/2022 Length of Stay: 15 days  Palliative medicine team following along peripherally with patient's medical journey since currently goals for medical care currently determined. Patient seeking rehab and further cancer directed therapies. Patient has accepted rehab SNF for [facility] as per EMR review. Please make sure patient has outpatient palliative medicine follow up; likely home based palliative medicine would best suit patient with his transportation concerns. Will consult TOC to assist with this. Please reach out if acute PMT needs arise. Thank you.   Alvester Morin, DO Palliative Care Provider PMT # [phone]

## 2022-12-19 NOTE — Progress Notes (Signed)
Occupational Therapy Treatment Patient Details Name: Scott Bass MRN: [id number] DOB: 1950/10/04 Today's Date: 12/19/2022   History of present illness 72 year old male presented for fall and found confused, tachycardic and constipated with labs significant for AKI with hyperkalemia and severe metabolic acidosis and Hg 3.5. LA 6.0;  CTs 12/04/2022-innumerable lytic lesions, large expansile lesion in the anterior right second rib, multiple pathologic rib fractures, expansile T4 (T3 on CT cervical spine) mass with soft tissue component extending into the central canal and left neural foramen, C7 transverse process fracture, displaced fracture of the lower RIGHT scapula, of uncertain age. PHMx: blindness (can see some, but not details), recently diagnosed multiple myeloma in May 2024 but has not started therapy (diffuse lesions) , HTN, DM   OT comments  This 72 yo male admitted for above and seen today is making slow progress but did better today than when I evaluated him ~1.5 weeks ago. He is able to get himself up to EOB with cues and HOB up/use of rail; able to stand with min A; ambulate with contact guard-min A with RW short distances, stand to groom for one activity at sink before getting fatigued. He needs the most assist with toileting. He will continue to benefit from acute OT with follow up  from continued inpatient follow up therapy, <3 hours/day.        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); wife blind also Restrictions Weight Bearing Restrictions: No       Mobility Bed Mobility Overal bed mobility: Needs  Assistance Bed Mobility: Supine to Sit     Supine to sit: Contact guard     General bed mobility comments: VCs for hand placement (use of rail)    Transfers Overall transfer level: Needs assistance Equipment used: Rolling walker (2 wheels) Transfers: Sit to/from Stand Sit to Stand: Min assist                 Balance Overall balance assessment: Needs assistance Sitting-balance support: No upper extremity supported, Feet supported Sitting balance-Leahy Scale: Good     Standing balance support: Reliant on assistive device for balance, No upper extremity supported Standing balance-Leahy Scale: Fair Standing balance comment: standing at sink in bathroom to wash face                           ADL either performed or assessed with clinical judgement   ADL Overall ADL's : Needs assistance/impaired     Grooming: Wash/dry face;Standing;Contact guard assist Grooming Details (indicate cue type and reason): very fatigued after this                   Toilet Transfer Details (indicate cue type and reason): contact guard A as we went into bathoom; min A as we came out of bathroom with min A sit<>stnad Toileting- Clothing Manipulation and Hygiene: Total assistance;Maximal assistance Toileting - Clothing Manipulation Details (indicate cue type and reason): min A sit<>stand            Extremity/Trunk Assessment Upper Extremity Assessment Upper Extremity Assessment: Generalized weakness            Vision Baseline Vision/History:  2 Legally blind Ability to See in Adequate Light: 3 Highly impaired Patient Visual Report: No change from baseline            Cognition Arousal: Alert Behavior During Therapy: Flat affect Overall Cognitive Status: Within Functional Limits for tasks assessed                                                     Pertinent Vitals/ Pain       Pain Assessment Pain Assessment: No/denies pain          Frequency  Min 1X/week        Progress Toward Goals  OT Goals(current goals can now be found in the care plan section)  Progress towards OT goals: Progressing toward goals (slowly)  Acute Rehab OT Goals Patient Stated Goal: okay with going to rehab before home--but still not pleased to do so  Plan Discharge plan needs to be updated       AM-PAC OT "6 Clicks" Daily Activity     Outcome Measure   Help from another person eating meals?: A Little Help from another person taking care of personal grooming?: A Little Help from another person toileting, which includes using toliet, bedpan, or urinal?: A Lot Help from another person bathing (including washing, rinsing, drying)?: A Little Help from another person to put on and taking off regular upper body clothing?: A Little Help from another person to put on and taking off regular lower body clothing?: A Lot 6 Click Score: 16    End of Session Equipment Utilized During Treatment: Gait belt;Rolling walker (2 wheels)  OT Visit Diagnosis: Unsteadiness on feet (R26.81);Other abnormalities of gait and mobility (R26.89);Muscle weakness (generalized) (M62.81)   Activity Tolerance Patient limited by fatigue   Patient Left in chair;with call bell/phone within reach;with chair alarm set;with family/visitor present   Nurse Communication  (activities he did with OT today)        Time: 8295-6213 OT Time Calculation (min): 24 min  Charges: OT General Charges $OT Visit: 1 Visit OT Treatments $Self Care/Home Management : 23-37 mins  Lindon Romp OT Acute Rehabilitation Services Office [phone]    Evette Georges 12/19/2022, 11:32 AM

## 2022-12-19 NOTE — Care Management Important Message (Signed)
Important Message  Patient Details IM Letter gvien Name: Scott Bass MRN: [id number] Date of Birth: [birth year]   Medicare Important Message Given:  Yes     Caren Macadam 12/19/2022, 11:24 AM

## 2022-12-19 NOTE — Plan of Care (Signed)

## 2022-12-19 NOTE — Progress Notes (Signed)
PROGRESS NOTE    Scott Bass  OZD:[id number] DOB: 11-22-1950 DOA: 12/04/2022 PCP: Tracey Harries, MD   Brief Narrative:  This 72 years old wheelchair-bound male with PMH significant for blindness, recently diagnosed multiple myeloma in May 2024 but has not started therapy yet, hypertension, diabetes mellitus presented s/p fall and found to be confused, tachycardic and constipated with labs significant for AKI with hyperkalemia and severe metabolic acidosis. He was also found to have hemoglobin 3.5, lactic acid 6.0. Patient was initially admitted in the ICU and started on broad-spectrum antibiotics and fluid resuscitation. Patient has received 3 units of packed red blood cells. hemoglobin 7.5. TRH pickup 12/06/2022.  He now agreeable to going to CIR and wants to continue with cancer directed therapies but he may not be able to tolerate all the therapies for CIR and so will now go to SNF. He had nausea and vomiting and abdominal discomfort and may have had an ileus vs Small bowel obstruction so will get an Enema and continue bowel regimen.   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  Not a candidate for hemodialysis given advanced malignancy, comorbidity and functional status. -Avoid Nephrotoxic Medications, Contrast Dyes, Hypotension and Dehydration to Ensure Adequate Renal Perfusion and will need to Renally Adjust Meds -Continue to Monitor and Trend Renal Function carefully and repeat CMP in the AM  -IV fluids now resumed given poor po intake -Good urine output, Foley was removed 8/3.   Multiple Myeloma with Diffuse Pathological  Fractures Pancytopenia -Oncology is consulted recommended Decadron and Velcade. -Palliative consulted to discuss goals of care and he wants to go home but after further discussion with his wife he is going to go to CIR and continue chemotherapy. -Velcade dose given on 8/8 and then plan is to start weekly dosing as per oncologist -CBC Trend: Recent Labs  Lab 12/13/22 0956 12/14/22 0620 12/15/22 0522 12/16/22 0606 12/17/22 0539 12/18/22 0539 12/19/22 1146  WBC 6.1 6.1 8.9 9.0 2.2* 2.2* 3.3*  HGB 9.0* 7.9* 8.0* 8.5* 8.4* 7.6* 7.5*  HCT 30.4* 26.4* 25.3* 26.9* 27.5* 25.1* 24.4*  MCV 97.4 97.1 93.4 91.5 95.5 95.1 94.2  PLT 132* 129* 153 146* 100* 94* 100*  -PT/OT recommending CIR -Palliative care consulted and patient had expressed that he wanted to go home with goals of care readdressed and now the patient wants to continue cancer directed treatments and pursue rehabilitation and he does not want to be comfort care and they are not on board with the hospice philosophy is for now -Will go to CIR once they initiate acceptance however now they don't think he will be able to tolerate 3 hours a day and are now recommending SNF; Has a bed at SNF and Anticipate D/C in the next 24-48 hours if labs are stable and having bowel movements and ileus is resolved.     Hyperkalemia: Resolved -Resolved with calcium gluconate, Lasix, insulin and D50. -Now hypokalemia > being replaced. Continue to monitor -K+ Trend: Recent Labs  Lab 12/13/22 0547 12/14/22 0620 12/15/22 0522 12/16/22 0606 12/17/22 0539 12/18/22 0539 12/19/22 1146  K 3.8 3.6 3.9 3.9 4.1 3.8 4.2  -Continue to monitor and replete as  necessary -Repeat potassium and CMP in a.m.   Hyponatremia:  -IVF resumed as below, Continue to monitor. -Na+ Trend: Recent Labs  Lab 12/13/22 0547 12/14/22 0620 12/15/22 0522 12/16/22 0606 12/17/22 0539 12/18/22 0539 12/19/22 1146  NA 141 143 134* 136 137 141 141  -Continue to Monitor and  trend and repeat CMP in the AM   Metabolic Acidosis -S/p sodium bicarbonate infusion. -Started oral bicarbonate and will continue and renewed -Patient has a CO2 of 18, AG of 7, and Chloride Level of 116 -Continuing IVF as above  but will stop after 1 Day -Continue to continue monitor and trend and repeat CMP in a.m.   Lactic acidosis > Improved. -IVF discontinued.  -Lactic Acid Level Trend: Recent Labs  Lab 12/04/22 0529 12/04/22 0819  LATICACIDVEN 6.0* 2.7*  -Continue monitor and trend and will not repeat lactic acid -Getting IVF with D5 NS + 20 mEQ and will stop after 1 Day    Acute Anemia likely Secondary to Multiple Myeloma: -S/p 2 unit PRBC -Hgb/Hct Trend: Recent Labs  Lab 12/13/22 0956 12/14/22 0620 12/15/22 0522 12/16/22 0606 12/17/22 0539 12/18/22 0539 12/19/22 1146  HGB 9.0* 7.9* 8.0* 8.5* 8.4* 7.6* 7.5*  HCT 30.4* 26.4* 25.3* 26.9* 27.5* 25.1* 24.4*  MCV 97.4 97.1 93.4 91.5 95.5 95.1 94.2  -Keep hemoglobin above 7. -Continue to monitor for signs and symptoms bleeding; no overt bleeding noted -Repeat CBC in the AM    Tachycardia, improving  -Likely multifactorial due to anemia, dehydration, suspected sepsis and agitation and beta-blocker withdrawal. -One of the 4 blood culture showed Staphylococcus Capitis.  Likely contaminant -Getting IVF as above -Continue monitor heart rates carefully and is noted that his beta-blocker was not resume but will resume now -Continue to monitor on telemetry   Large mass involving T4 vertebral body with soft tissue component: -Consider MRI with and without C and T-spine when renal functions further    Acute Metabolic Encephalopathy, improving  -Likely secondary to multiple myeloma, uremia and others. -Minimize sedation,  reorient as above. -Continue Haldol as needed for agitation and restlessness. -Psychiatry consulted for agitation, restlessness.  -SLP now recommending Dysphagia 3   Constipation and ? Ileus vs Early  Small bowel Obstruction -KUB Done and showed "Interval mild air distension of small and large bowel with moderate feces in the right colon. Question mild ileus, though consider radiographic follow-up to exclude developing bowel obstruction." -Continue aggressive bowel regimen and adjusted now and will give SMOG -Repeat KUB Pending    Hypothyroidism -Continue Levothyroxine 150 mcg po Daily .  Glaucoma -Continue Eyedrops with latanoprost 1 drop in both eyes nightly, brimonidine 1 drop in both eyes 3 times daily.   -Right eye blind, very poor vision.  Patient can see through left eye   Folic Acid Deficiency -Started folate supplement   Abnormal LFTs, mild -Mild. LFT Trend: Recent Labs  Lab 12/04/22 0515 12/06/22 0615 12/16/22 0606 12/17/22 0539 12/18/22 0539 12/19/22 1146  AST 35 31 53* 55* 31 29  ALT 28 19 51* 53* 36 31  BILITOT 1.0 0.9 0.8 0.6 0.5 0.7  ALKPHOS 62 58 135* 122 109 116  -Continue to Monitor and Trend and Likely Reactive -Repeat CMP in the AM   Moderate Malnutrition with Poor Po Intake Estimated body mass index is 24.16 kg/m as calculated from the following:   Height as of this encounter: 5\' 6"  (1.676 m).   Weight as of this encounter: 67.9 kg. Nutrition Status: Nutrition Problem: Moderate Malnutrition Etiology: chronic illness  Signs/Symptoms: mild fat depletion, mild muscle depletion, percent weight loss (28% in 3 months) Percent weight loss: 28 % (in 3 months) Interventions: Refer to RD note for recommendations, Magic cup, Hormel Shake  Hypoalbuminemia -Patient's Albumin Trend: Recent Labs  Lab 12/04/22 0515 12/06/22 0615 12/09/22 0323 12/16/22 0606 12/17/22 0539 12/18/22 0539 12/19/22 1146  ALBUMIN 2.6* 1.9* 2.0* 2.3* 2.3* 2.1* 2.1*  -Continue to Monitor and Trend and repeat CMP in the AM   DVT prophylaxis: heparin injection 5,000 Units Start: 12/17/22 1400 SCDs Start: 12/04/22 0948    Code Status: DNR Family Communication: Discussed with  wife at bedside   Disposition Plan:  Level of care: Telemetry Status is: Inpatient Remains inpatient appropriate because: Needs to ensure ileus is resolved and will anticipate D/C in the next 24-48 hours   Consultants:  Medical Oncology Palliative care medicine PCCM transfer CIR Nephrology  Procedures:  As delineated as above   Antimicrobials:  Anti-infectives (From admission, onward)    Start     Dose/Rate Route Frequency Ordered Stop   12/11/22 1000  fluconazole (DIFLUCAN) tablet 100 mg        100 mg Oral Daily 12/11/22 0844 12/16/22 0959   12/05/22 1000  cefTRIAXone (ROCEPHIN) 2 g in sodium chloride 0.9 % 100 mL IVPB        2 g 200 mL/hr over 30 Minutes Intravenous Every 24 hours 12/04/22 1340 12/08/22 1008   12/04/22 0600  ceFEPIme (MAXIPIME) 2 g in sodium chloride 0.9 % 100 mL IVPB        2 g 200 mL/hr over 30 Minutes Intravenous  Once 12/04/22 0557 12/04/22 0726   12/04/22 0600  metroNIDAZOLE (FLAGYL) IVPB 500 mg        500 mg 100 mL/hr over 60 Minutes Intravenous  Once 12/04/22 0557 12/04/22 0829   12/04/22 0600  vancomycin (VANCOCIN) IVPB 1000 mg/200 mL premix        1,000 mg 200 mL/hr over 60 Minutes Intravenous  Once 12/04/22 0557 12/04/22 0918       Subjective: Seen and examined at bedside and states that he had a bowel movement but states already back bowel movement.  No nausea or vomiting and thinks is improved.  No chest pain or shortness breath.  No other concerns at this time.  Objective: Vitals:   12/19/22 0720 12/19/22 1338 12/19/22 1356 12/19/22 1932  BP:  94/60 (!) 98/56 107/66  Pulse:  83  85  Resp:  16  16  Temp:  98.6 F (37 C)  97.6 F (36.4 C)  TempSrc:  Oral    SpO2:  99%  100%  Weight: 67.9 kg     Height:        Intake/Output Summary (Last 24 hours) at 12/19/2022 1941 Last data filed at 12/19/2022 1115 Gross per 24 hour  Intake 558 ml  Output 550 ml  Net 8 ml   Filed Weights   12/14/22 0700 12/17/22 0415 12/19/22 0720  Weight:  64.8 kg 69.3 kg 67.9 kg   Examination: Physical Exam:  Constitutional: Chronically ill-appearing African-American male who appears a bit more comfortable today but has not eaten yet Respiratory: Diminished to auscultation bilaterally, no wheezing, rales, rhonchi or crackles. Normal respiratory effort and patient is not tachypenic. No accessory muscle use.  Unlabored breathing Cardiovascular: RRR, no murmurs / rubs / gallops. S1 and S2 auscultated. No extremity edema.  Abdomen: Soft, little tender, distended secondary to body habitus. Bowel sounds positive.  GU: Deferred. Musculoskeletal: No clubbing /  cyanosis of digits/nails. No joint deformity upper and lower extremities.  Skin: No rashes, lesions, ulcers limited skin evaluation. No induration; Warm and dry.  Neurologic: Patient is blind but other cranial nerves appear intact.  Romberg sign and cerebellar reflexes not assessed Psychiatric: He is awake and alert.   Data Reviewed: I have personally reviewed following labs and imaging studies  CBC: Recent Labs  Lab 12/15/22 0522 12/16/22 0606 12/17/22 0539 12/18/22 0539 12/19/22 1146  WBC 8.9 9.0 2.2* 2.2* 3.3*  NEUTROABS 8.1* 8.0* 1.3* 1.1* 2.3  HGB 8.0* 8.5* 8.4* 7.6* 7.5*  HCT 25.3* 26.9* 27.5* 25.1* 24.4*  MCV 93.4 91.5 95.5 95.1 94.2  PLT 153 146* 100* 94* 100*   Basic Metabolic Panel: Recent Labs  Lab 12/15/22 0522 12/16/22 0606 12/17/22 0539 12/18/22 0539 12/19/22 1146  NA 134* 136 137 141 141  K 3.9 3.9 4.1 3.8 4.2  CL 102 104 106 113* 116*  CO2 19* 20* 17* 18* 18*  GLUCOSE 129* 177* 176* 141* 205*  BUN 48* 65* 70* 52* 36*  CREATININE 2.42* 2.68* 2.76* 2.29* 2.12*  CALCIUM 7.5* 7.7* 7.6* 7.8* 7.7*  MG 1.8 2.4 2.3 2.0 1.7  PHOS 4.3 4.2 4.1 2.8 1.9*   GFR: Estimated Creatinine Clearance: 28.8 mL/min (A) (by C-G formula based on SCr of 2.12 mg/dL (H)). Liver Function Tests: Recent Labs  Lab 12/16/22 0606 12/17/22 0539 12/18/22 0539 12/19/22 1146  AST  53* 55* 31 29  ALT 51* 53* 36 31  ALKPHOS 135* 122 109 116  BILITOT 0.8 0.6 0.5 0.7  PROT 6.8 6.3* 5.5* 5.8*  ALBUMIN 2.3* 2.3* 2.1* 2.1*   No results for input(s): "LIPASE", "AMYLASE" in the last 168 hours. No results for input(s): "AMMONIA" in the last 168 hours. Coagulation Profile: No results for input(s): "INR", "PROTIME" in the last 168 hours. Cardiac Enzymes: No results for input(s): "CKTOTAL", "CKMB", "CKMBINDEX", "TROPONINI" in the last 168 hours. BNP (last 3 results) No results for input(s): "PROBNP" in the last 8760 hours. HbA1C: No results for input(s): "HGBA1C" in the last 72 hours. CBG: Recent Labs  Lab 12/19/22 0404 12/19/22 0759 12/19/22 1136 12/19/22 1539 12/19/22 1936  GLUCAP 109* 169* 193* 158* 85   Lipid Profile: No results for input(s): "CHOL", "HDL", "LDLCALC", "TRIG", "CHOLHDL", "LDLDIRECT" in the last 72 hours. Thyroid Function Tests: No results for input(s): "TSH", "T4TOTAL", "FREET4", "T3FREE", "THYROIDAB" in the last 72 hours. Anemia Panel: No results for input(s): "VITAMINB12", "FOLATE", "FERRITIN", "TIBC", "IRON", "RETICCTPCT" in the last 72 hours. Sepsis Labs: No results for input(s): "PROCALCITON", "LATICACIDVEN" in the last 168 hours.  No results found for this or any previous visit (from the past 240 hour(s)).   Radiology Studies: DG Abd 1 View  Result Date: 12/18/2022 CLINICAL DATA:  Abdomen discomfort EXAM: ABDOMEN - 1 VIEW COMPARISON:  02/12/2013, CT 12/04/2022 FINDINGS: Interval mild air distension of small and large bowel with moderate feces in the right colon. Numerous lucent lesions in the pelvis corresponding to history of myeloma. IMPRESSION: Interval mild air distension of small and large bowel with moderate feces in the right colon. Question mild ileus, though consider radiographic follow-up to exclude developing bowel obstruction Electronically Signed   By: Jasmine Pang M.D.   On: 12/18/2022 19:42    Scheduled Meds:  amLODipine   5 mg Oral Daily   brimonidine  1 drop Both Eyes TID   ferrous sulfate  325 mg Oral QODAY   folic acid  1 mg Oral Daily   heparin  injection (subcutaneous)  5,000 Units Subcutaneous Q8H   insulin aspart  0-15 Units Subcutaneous Q4H   latanoprost  1 drop Both Eyes QHS   levothyroxine  150 mcg Oral Q0600   metoprolol tartrate  25 mg Oral BID   pantoprazole  40 mg Oral BID   polyethylene glycol  17 g Oral BID   senna-docusate  1 tablet Oral BID   sodium bicarbonate  650 mg Oral TID   sorbitol, milk of mag, mineral oil, glycerin (SMOG) enema  960 mL Rectal Once   tamsulosin  0.4 mg Oral Daily   Continuous Infusions:  dextrose 5 % and 0.9 % NaCl with KCl 20 mEq/L      LOS: 15 days   Marguerita Merles, DO [facility] Available via Epic secure chat 7am-7pm After these hours, please refer to coverage provider listed on [email or web address] 12/19/2022, 7:41 PM

## 2022-12-20 ENCOUNTER — Inpatient Hospital Stay (HOSPITAL_COMMUNITY): Payer: Medicare PPO

## 2022-12-20 DIAGNOSIS — C9000 Multiple myeloma not having achieved remission: Secondary | ICD-10-CM | POA: Diagnosis not present

## 2022-12-20 DIAGNOSIS — N170 Acute kidney failure with tubular necrosis: Secondary | ICD-10-CM | POA: Diagnosis not present

## 2022-12-20 DIAGNOSIS — D649 Anemia, unspecified: Secondary | ICD-10-CM | POA: Diagnosis not present

## 2022-12-20 DIAGNOSIS — N184 Chronic kidney disease, stage 4 (severe): Secondary | ICD-10-CM | POA: Diagnosis not present

## 2022-12-20 LAB — GLUCOSE, CAPILLARY
Glucose-Capillary: 155 mg/dL — ABNORMAL HIGH (ref 70–99)
Glucose-Capillary: 89 mg/dL (ref 70–99)
Glucose-Capillary: 201 mg/dL — ABNORMAL HIGH (ref 70–99)
Glucose-Capillary: 173 mg/dL — ABNORMAL HIGH (ref 70–99)
Glucose-Capillary: 60 mg/dL — ABNORMAL LOW (ref 70–99)
Glucose-Capillary: 92 mg/dL (ref 70–99)

## 2022-12-20 MED ORDER — ENSURE ENLIVE PO LIQD
237.0000 mL | Freq: Two times a day (BID) | ORAL | Status: DC
  Administered 2022-12-21 – 2022-12-25 (×8): 237 mL via ORAL

## 2022-12-20 NOTE — Progress Notes (Signed)
PROGRESS NOTE    Scott Bass  OZH:[id number] DOB: 03-04-1951 DOA: 12/04/2022 PCP: Tracey Harries, MD   Brief Narrative: Scott Bass is a 72 y.o. male with a history of blindness, multiple myeloma not yet on therapy, hypertension, diabetes mellitus.  Patient presented secondary to a fall and was found to have severe acute on chronic renal failure with associated anemia and hemoglobin of 3.5.  Patient required ICU admission on presentation and was started on IV fluids for hydration in addition to receiving a total of 3 units of PRBC.  Nephrology was consulted for acute kidney injury concerning for possible complication of untreated known multiple myeloma in addition to hypovolemia/dehydration.  Patient was started empirically on antibiotics for possible infection with blood cultures significant for Staphylococcus capitis and 1 out of 4 bottles consistent with likely contaminant.  Renal function continued to improve during admission.  Patient started on chemotherapy for multiple Aloma while inpatient.   Assessment and Plan:  AKI on CKD stage IV Baseline creatinine of about 1.5-1.7. Creatinine of 11.76 on admission requiring ICU admission. Patient managed on IV fluids. Nephrology consulted with recommendation to defer hemodialysis secondary to patient's poor candidacy. Creatinine improved steadily with IV fluids and has continued to improve while off of IV fluids.  Multiple myeloma Diffuse pathological fractures Medical oncology consulted and started Velcade and Decadron while inpatient.  Pancytopenia Stable.  Hyperkalemia Likely secondary to AKI. Resolved.  Hyponatremia Resolved.  Hypernatremia Resolved.  Metabolic acidosis Secondary to AKI. Improved.  Acute anemia Likely secondary to multiple myeloma. Patient received a total of 3 units of PRBC via transfusion this admission.  Tachycardia Resolved.  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 Deferred at this time secondary to renal function.  Acute metabolic encephalopathy Secondary to uremia. Improved.  Constipation -Continue MiraLAX and Senokot-S  Hypothyroidism -Continue Synthroid  Glaucoma -Continue Xalatan and Alphagan  Folic acid deficiency -Continue folic acid  Moderate malnutrition -Continue protein supplementation  Hypoalbuminemia Noted. Related to poor nutrition.   DVT prophylaxis: Heparin Code Status:   Code Status: DNR Family Communication: Wife at bedside Disposition Plan: Discharge to SNF likely in 24 hours   Consultants:    Procedures:    Antimicrobials:     Subjective: Patient reports no concerns this morning.  Objective: BP 102/64 (BP Location: Right Arm)   Pulse 95   Temp 97.7 F (36.5 C) (Oral)   Resp 16   Ht 5\' 6"  (1.676 m)   Wt 67.2 kg   SpO2 100%   BMI 23.91 kg/m   Examination:  General exam: Appears calm and comfortable Respiratory system: Clear to auscultation. Respiratory effort normal. Cardiovascular system: S1 & S2 heard, RRR. Gastrointestinal system: Abdomen is nondistended, soft and nontender. Central nervous system: Alert and oriented. No focal neurological deficits. Musculoskeletal: No edema. No calf tenderness Skin: No cyanosis. No rashes Psychiatry: Judgement and insight appear normal. Mood & affect appropriate.    Data Reviewed: I have personally reviewed following labs and imaging studies  CBC Lab Results  Component Value Date   WBC 3.7 (L) 12/20/2022   RBC 2.46 (L) 12/20/2022   HGB 7.2 (L) 12/20/2022   HCT 23.4 (L) 12/20/2022   MCV 95.1 12/20/2022   MCH 29.3 12/20/2022   PLT 99 (L) 12/20/2022   MCHC 30.8 12/20/2022   RDW 19.1 (H) 12/20/2022   LYMPHSABS 0.7 12/20/2022   MONOABS 0.5 12/20/2022   EOSABS 0.1 12/20/2022   BASOSABS 0.0  12/20/2022     Last metabolic panel Lab Results  Component Value Date   NA 144 12/20/2022   K 4.1 12/20/2022   CL 118  (H) 12/20/2022   CO2 19 (L) 12/20/2022   BUN 29 (H) 12/20/2022   CREATININE 1.97 (H) 12/20/2022   GLUCOSE 166 (H) 12/20/2022   GFRNONAA 36 (L) 12/20/2022   GFRAA >60 05/19/2015   CALCIUM 7.7 (L) 12/20/2022   PHOS 2.2 (L) 12/20/2022   PROT 5.3 (L) 12/20/2022   ALBUMIN 1.9 (L) 12/20/2022   LABGLOB 6.8 (H) 10/05/2022   AGRATIO 0.6 (L) 09/25/2022   BILITOT 0.8 12/20/2022   ALKPHOS 102 12/20/2022   AST 25 12/20/2022   ALT 28 12/20/2022   ANIONGAP 7 12/20/2022    GFR: Estimated Creatinine Clearance: 31 mL/min (A) (by C-G formula based on SCr of 1.97 mg/dL (H)).  No results found for this or any previous visit (from the past 240 hour(s)).    Radiology Studies: DG Abd 1 View  Result Date: 12/19/2022 CLINICAL DATA:  Abdominal distention. EXAM: ABDOMEN - 1 VIEW COMPARISON:  12/18/2022 FINDINGS: Gas and stool throughout the colon. Scattered gas-filled small bowel. No small or large bowel distention. No radiopaque stones. Prominent degenerative changes in the spine and hips. Multiple lucent lesions demonstrated throughout the spine and pelvis consistent with multiple myeloma. Lung bases are clear. IMPRESSION: Nonobstructive bowel gas pattern with scattered stool filled colon. Electronically Signed   By: Burman Nieves M.D.   On: 12/19/2022 20:15   DG Abd 1 View  Result Date: 12/18/2022 CLINICAL DATA:  Abdomen discomfort EXAM: ABDOMEN - 1 VIEW COMPARISON:  02/12/2013, CT 12/04/2022 FINDINGS: Interval mild air distension of small and large bowel with moderate feces in the right colon. Numerous lucent lesions in the pelvis corresponding to history of myeloma. IMPRESSION: Interval mild air distension of small and large bowel with moderate feces in the right colon. Question mild ileus, though consider radiographic follow-up to exclude developing bowel obstruction Electronically Signed   By: Jasmine Pang M.D.   On: 12/18/2022 19:42      LOS: 16 days    Scott Hawking, MD [facility] 12/20/2022, 12:46 PM   If 7PM-7AM, please contact night-coverage [email or web address]

## 2022-12-20 NOTE — Telephone Encounter (Signed)
done
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
## 2022-12-20 NOTE — TOC Progression Note (Signed)
Transition of Care [facility]) - Progression Note    Patient Details  Name: Scott Bass MRN: [id number] Date of Birth: 18-Jun-1950  Transition of Care [facility] CM/SW Contact  Otelia Santee, LCSW Phone Number: 12/20/2022, 12:47 PM  Clinical Narrative:    Met with pt and spouses to discuss recommendation for outpatient palliative follow up. Pt and spouse are agreeable to referral being made and do not have a preference for which agency provides this service. A referral has been made to [facility] for palliative care follow up.      Expected Discharge Plan: IP Rehab Facility Barriers to Discharge: Continued Medical Work up  Expected Discharge Plan and Services In-house Referral: Clinical Social Work     Living arrangements for the past 2 months: Single Family Home                                       Social Determinants of Health (SDOH) Interventions SDOH Screenings   Food Insecurity: Food Insecurity Present (12/06/2022)  Housing: Low Risk  (12/06/2022)  Transportation Needs: No Transportation Needs (12/06/2022)  Utilities: Not At Risk (12/06/2022)  Financial Resource Strain: Low Risk  (10/10/2022)   Received from [facility], [facility]  Physical Activity: Insufficiently Active (12/23/2020)   Received from [facility], [facility]  Social Connections: Unknown (09/16/2021)   Received from [facility], [facility]  Stress: No Stress Concern Present (12/23/2020)   Received from [facility], [facility]  Tobacco Use: Low Risk  (12/06/2022)    Readmission Risk Interventions    12/19/2022   11:42 AM 12/07/2022   11:22 AM  Readmission Risk Prevention Plan  Transportation Screening Complete   Medication Review (RN Care Manager) Complete   PCP or Specialist appointment within 3-5 days of discharge Complete   HRI or Home Care Consult Complete Complete  SW Recovery Care/Counseling Consult Complete Complete  Palliative Care Screening Not Applicable   Skilled  Nursing Facility Complete

## 2022-12-20 NOTE — Plan of Care (Signed)

## 2022-12-20 NOTE — Progress Notes (Addendum)
Speech Language Pathology Treatment: Dysphagia  Patient Details Name: Scott Bass MRN: [id number] DOB: May 10, 1950 Today's Date: 12/20/2022 Time: 9528-4132 SLP Time Calculation (min) (ACUTE ONLY): 12 min  Assessment / Plan / Recommendation Clinical Impression  Pt seen for dysphagia therapy with wife at bedside. He had been on nectar thick liquids however refused to drink them and was upgraded to thin liquids with chin tuck on 8/12 and possible MBS if needed. Today when asked what he is supposed to do when he is drinking he needed a prompt but his wife answered for the pt re: the chin tuck. He appears to have limited ROM with neck and could tuck but not a significant range. He did need reminders, verbal and tactile to tuck and to take small sips. He coughed with approximately 50-60% of trials indicative of possible airway intrusion. He had abdominal ultrasound yesterday that noted lung bases are clear. Discussed thick liquids with pt he continues to decline these and not certain he understands possible ramifications. He masticated graham cracker with mildly prolonged mastication and requested liquid to help transit. Pt states he prefers to continue with Dys 3 (chopped meats), continue thin liquids with chin tuck. MBS to fully assess swallow function and if other strategies may assist without having to thicken pt's liquids. MBS scheduled for today at 2:30. ADDENDUM: MBS has been moved till tomorrow 8/15   HPI HPI: 72 year old wheel chair bound male with blindness, recently diagnosed multiple myeloma in May 2024 but not has started therapy, HTN, DM who presented for fall and found confused, tachycardic and constipated with labs significant for AKI with hyperkalemia and severe metabolic acidosis and Hg 3.5. LA 6.0. PCCM consulted for admission. In the ED started on broad spectrum abx and fluid resuscitation; CT chest indicated No acute findings within the chest, abdomen or pelvis. No  evidence of solid  organ injury. No hemorrhage or edema within the  mediastinum. No pleural effusion or pneumothorax ; ST consulted for swallow evaluation d/t dysphagia noted with liquids per nursing report.  PT has been maintained on nectar liquid diet and water protocol - he demonstrated some vomiting with po which is not always coorelated to po intake per pt.   Reeval ordered.      SLP Plan  All goals met;Discharge SLP treatment due to (comment)      Recommendations for follow up therapy are one component of a multi-disciplinary discharge planning process, led by the attending physician.  Recommendations may be updated based on patient status, additional functional criteria and insurance authorization.    Recommendations  Diet recommendations: Dysphagia 3 (mechanical soft);Thin liquid Liquids provided via: Cup;Straw Medication Administration: Whole meds with puree Supervision: Full supervision/cueing for compensatory strategies Compensations: Slow rate;Small sips/bites;Chin tuck (with liquids) Postural Changes and/or Swallow Maneuvers: Seated upright 90 degrees;Upright 30-60 min after meal                  Oral care BID   Frequent or constant Supervision/Assistance Dysphagia, oropharyngeal phase (R13.12)     All goals met;Discharge SLP treatment due to (comment)     Royce Macadamia  12/20/2022, 12:41 PM

## 2022-12-21 ENCOUNTER — Inpatient Hospital Stay (HOSPITAL_COMMUNITY): Payer: Medicare PPO

## 2022-12-21 ENCOUNTER — Encounter: Payer: Self-pay | Admitting: Hematology

## 2022-12-21 DIAGNOSIS — N184 Chronic kidney disease, stage 4 (severe): Secondary | ICD-10-CM | POA: Diagnosis not present

## 2022-12-21 DIAGNOSIS — C9000 Multiple myeloma not having achieved remission: Secondary | ICD-10-CM | POA: Diagnosis not present

## 2022-12-21 DIAGNOSIS — N170 Acute kidney failure with tubular necrosis: Secondary | ICD-10-CM | POA: Diagnosis not present

## 2022-12-21 LAB — CBC
HCT: 21.5 % — ABNORMAL LOW (ref 39.0–52.0)
Hemoglobin: 6.6 g/dL — CL (ref 13.0–17.0)
MCH: 28.8 pg (ref 26.0–34.0)
MCHC: 30.7 g/dL (ref 30.0–36.0)
MCV: 93.9 fL (ref 80.0–100.0)
Platelets: 104 10*3/uL — ABNORMAL LOW (ref 150–400)
RBC: 2.29 MIL/uL — ABNORMAL LOW (ref 4.22–5.81)
RDW: 19.7 % — ABNORMAL HIGH (ref 11.5–15.5)
WBC: 3.3 10*3/uL — ABNORMAL LOW (ref 4.0–10.5)
nRBC: 0.6 % — ABNORMAL HIGH (ref 0.0–0.2)

## 2022-12-21 LAB — PREPARE RBC (CROSSMATCH)

## 2022-12-21 LAB — GLUCOSE, CAPILLARY
Glucose-Capillary: 107 mg/dL — ABNORMAL HIGH (ref 70–99)
Glucose-Capillary: 140 mg/dL — ABNORMAL HIGH (ref 70–99)
Glucose-Capillary: 182 mg/dL — ABNORMAL HIGH (ref 70–99)
Glucose-Capillary: 192 mg/dL — ABNORMAL HIGH (ref 70–99)
Glucose-Capillary: 267 mg/dL — ABNORMAL HIGH (ref 70–99)
Glucose-Capillary: 86 mg/dL (ref 70–99)

## 2022-12-21 LAB — BASIC METABOLIC PANEL
Anion gap: 7 (ref 5–15)
BUN: 26 mg/dL — ABNORMAL HIGH (ref 8–23)
CO2: 18 mmol/L — ABNORMAL LOW (ref 22–32)
Calcium: 7.5 mg/dL — ABNORMAL LOW (ref 8.9–10.3)
Chloride: 112 mmol/L — ABNORMAL HIGH (ref 98–111)
Creatinine, Ser: 1.83 mg/dL — ABNORMAL HIGH (ref 0.61–1.24)
GFR, Estimated: 39 mL/min — ABNORMAL LOW (ref >60)
Glucose, Bld: 231 mg/dL — ABNORMAL HIGH (ref 70–99)
Potassium: 4.6 mmol/L (ref 3.5–5.1)
Sodium: 137 mmol/L (ref 135–145)

## 2022-12-21 LAB — HEMOGLOBIN AND HEMATOCRIT, BLOOD
HCT: 27.3 % — ABNORMAL LOW (ref 39.0–52.0)
Hemoglobin: 8.4 g/dL — ABNORMAL LOW (ref 13.0–17.0)

## 2022-12-21 MED ORDER — SODIUM CHLORIDE 0.9% IV SOLUTION: Freq: Once | INTRAVENOUS | Status: AC

## 2022-12-21 MED ORDER — BORTEZOMIB CHEMO SQ INJECTION 3.5 MG (2.5MG/ML)
1.5000 mg/m2 | Freq: Once | INTRAMUSCULAR | Status: AC
  Administered 2022-12-21: 2.75 mg via SUBCUTANEOUS
  Filled 2022-12-21: qty 1.1

## 2022-12-21 MED ORDER — DEXAMETHASONE 4 MG PO TABS
20.0000 mg | ORAL_TABLET | Freq: Once | ORAL | Status: AC
  Administered 2022-12-21: 20 mg via ORAL
  Filled 2022-12-21: qty 5

## 2022-12-21 NOTE — Procedures (Signed)
Modified Barium Swallow Study  Patient Details  Name: Scott Bass MRN: [id number] Date of Birth: 06-19-1950  Today's Date: 12/21/2022  Modified Barium Swallow completed.  Full report located under Chart Review in the Imaging Section.  History of Present Illness 72 year old wheel chair bound male with blindness, recently diagnosed multiple myeloma in May 2024 but not has started therapy, HTN, DM who presented for fall and found confused, tachycardic and constipated with labs significant for AKI with hyperkalemia and severe metabolic acidosis and Hg 3.5. LA 6.0. PCCM consulted for admission. In the ED started on broad spectrum abx and fluid resuscitation; CT chest indicated no acute findings within the chest, abdomen or pelvis. No  evidence of solid organ injury. No hemorrhage or edema within the  mediastinum. No pleural effusion or pneumothorax ; ST consulted for swallow evaluation d/t dysphagia noted with liquids per nursing report.  Pt has been maintained on nectar liquid diet and water protocol - he demonstrated some vomiting with po which is not always coorelated to po intake per pt.     Clinical Impression Pt presents with very high risk of aspiration across textures. He exhibits silent aspiration of thin liquids, but is at high risk for aspiration of nectar as well - NTL are penetrated but do not clear the airway.  Poor oral prep and bolus formation, delayed swallow reflex, post swallow residue, and penetration/aspiration of liquids noted Compensatory strategies (chin tuck, dry swallow, throat clear/cough) were inconsistently effective, likely due to a combination of pt's cognitive impairment and level of deconditioning.  Extended oral prep of solid textures raises concern for fatigue during meals with inadequate nutrition/hydration.  Pt was unable to clear barium tablet from oral cavity with puree textures.   Recommend Dys2/Nectar thick liquids, meds crushed in puree. Cued cough/throat  clear are not effective to remove penetrate completely.  Factors that may increase risk of adverse event in presence of aspiration Scott Bass & Clearance Scott Bass 2021):  Reduced cognitive function Limited mobility Dependence for feeding and/or oral hygiene Weak cough Poor general health and/or compromised immunity  Swallow Evaluation Recommendations Recommendations: PO diet PO Diet Recommendation: Dysphagia 2 (Finely chopped);Mildly thick liquids (Level 2, nectar thick) Liquid Administration via: Cup;Straw Medication Administration: Crushed with puree Supervision: Full assist for feeding;Full supervision/cueing for swallowing strategies Swallowing strategies  : Minimize environmental distractions;Slow rate;Small bites/sips;effortful swallow;Clear throat intermittently;Multiple dry swallows after each bite/sip Postural changes: Position pt fully upright for meals;Stay upright 30-60 min after meals Oral care recommendations: Oral care QID (4x/day)   Scott Bass, [facility], CCC-SLP Speech Language Pathologist Office: [phone]  Scott Bass 12/21/2022,1:58 PM

## 2022-12-21 NOTE — Plan of Care (Signed)
  Problem: Activity: Goal: Risk for activity intolerance will decrease Outcome: Not Progressing   Problem: Nutrition: Goal: Adequate nutrition will be maintained Outcome: Not Progressing   

## 2022-12-21 NOTE — Progress Notes (Signed)
Ok to tx with Hgb=6.6 per Dr Candise Che

## 2022-12-21 NOTE — Progress Notes (Signed)
PROGRESS NOTE    Scott Bass  ZOX:[id number] DOB: 1950-10-26 DOA: 12/04/2022 PCP: Tracey Harries, MD   Brief Narrative: Scott Bass is a 72 y.o. male with a history of blindness, multiple myeloma not yet on therapy, hypertension, diabetes mellitus.  Patient presented secondary to a fall and was found to have severe acute on chronic renal failure with associated anemia and hemoglobin of 3.5.  Patient required ICU admission on presentation and was started on IV fluids for hydration in addition to receiving a total of 3 units of PRBC.  Nephrology was consulted for acute kidney injury concerning for possible complication of untreated known multiple myeloma in addition to hypovolemia/dehydration.  Patient was started empirically on antibiotics for possible infection with blood cultures significant for Staphylococcus capitis and 1 out of 4 bottles consistent with likely contaminant.  Renal function continued to improve during admission.  Patient started on chemotherapy for multiple myeloma while inpatient.   Assessment and Plan:  AKI on CKD stage IV Baseline creatinine of about 1.5-1.7. Creatinine of 11.76 on admission requiring ICU admission. Patient managed on IV fluids. Nephrology consulted with recommendation to defer hemodialysis secondary to patient's poor candidacy. Creatinine improved steadily with IV fluids and has continued to improve while off of IV fluids.  Multiple myeloma Diffuse pathological fractures Medical oncology consulted and started Velcade and Decadron while inpatient.  Pancytopenia WBC and platelets stable with unstable hemoglobin.  Hyperkalemia Likely secondary to AKI. Resolved.  Hyponatremia Resolved.  Hypernatremia Resolved.  Metabolic acidosis Secondary to AKI. Improved.  Acute anemia Likely secondary to multiple myeloma. Patient received a total of 3 units of PRBC via transfusion this admission. Hemoglobin drift down below 7 again to 6.6 this  morning. -Transfuse 1 unit of PRBC  Tachycardia Resolved.  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 Deferred at this time secondary to renal function.  Acute metabolic encephalopathy Secondary to uremia. Improved.  Constipation -Continue MiraLAX and Senokot-S  Hypothyroidism -Continue Synthroid  Glaucoma -Continue Xalatan and Alphagan  Folic acid deficiency -Continue folic acid  Moderate malnutrition -Continue protein supplementation  Hypoalbuminemia Noted. Related to poor nutrition.   DVT prophylaxis: Heparin Code Status:   Code Status: DNR Family Communication: None at bedside Disposition Plan: Discharge to SNF likely in 24 hours if hemoglobin remains stable   Consultants:  Palliative care Psychiatry Nephrology PCCM  Procedures:    Antimicrobials:     Subjective: Patient reports no concerns from overnight. No documented episodes of bleeding or melena. No documented episodes of other sources of hemorrhage.   Objective: BP 121/67 (BP Location: Right Arm)   Pulse 94   Temp 98 F (36.7 C) (Oral)   Resp 17   Ht 5\' 6"  (1.676 m)   Wt 67.2 kg   SpO2 100%   BMI 23.91 kg/m   Examination:  General exam: Appears calm and comfortable Respiratory system: Clear to auscultation. Respiratory effort normal. Cardiovascular system: S1 & S2 heard Gastrointestinal system: Abdomen is nondistended, soft and nontender. Normal bowel sounds heard. Central nervous system: Alert and oriented. Psychiatry: Judgement and insight appear normal. Mood & affect appropriate.    Data Reviewed: I have personally reviewed following labs and imaging studies  CBC Lab Results  Component Value Date   WBC 3.3 (L) 12/21/2022   RBC 2.29 (L) 12/21/2022   HGB 6.6 (LL) 12/21/2022   HCT 21.5 (L) 12/21/2022   MCV 93.9 12/21/2022   MCH 28.8 12/21/2022   PLT  104 (L) 12/21/2022   MCHC 30.7 12/21/2022   RDW 19.7 (H) 12/21/2022    LYMPHSABS 0.7 12/20/2022   MONOABS 0.5 12/20/2022   EOSABS 0.1 12/20/2022   BASOSABS 0.0 12/20/2022     Last metabolic panel Lab Results  Component Value Date   NA 144 12/20/2022   K 4.1 12/20/2022   CL 118 (H) 12/20/2022   CO2 19 (L) 12/20/2022   BUN 29 (H) 12/20/2022   CREATININE 1.97 (H) 12/20/2022   GLUCOSE 166 (H) 12/20/2022   GFRNONAA 36 (L) 12/20/2022   GFRAA >60 05/19/2015   CALCIUM 7.7 (L) 12/20/2022   PHOS 2.2 (L) 12/20/2022   PROT 5.3 (L) 12/20/2022   ALBUMIN 1.9 (L) 12/20/2022   LABGLOB 6.8 (H) 10/05/2022   AGRATIO 0.6 (L) 09/25/2022   BILITOT 0.8 12/20/2022   ALKPHOS 102 12/20/2022   AST 25 12/20/2022   ALT 28 12/20/2022   ANIONGAP 7 12/20/2022    GFR: Estimated Creatinine Clearance: 31 mL/min (A) (by C-G formula based on SCr of 1.97 mg/dL (H)).  No results found for this or any previous visit (from the past 240 hour(s)).    Radiology Studies: DG Abd 1 View  Result Date: 12/19/2022 CLINICAL DATA:  Abdominal distention. EXAM: ABDOMEN - 1 VIEW COMPARISON:  12/18/2022 FINDINGS: Gas and stool throughout the colon. Scattered gas-filled small bowel. No small or large bowel distention. No radiopaque stones. Prominent degenerative changes in the spine and hips. Multiple lucent lesions demonstrated throughout the spine and pelvis consistent with multiple myeloma. Lung bases are clear. IMPRESSION: Nonobstructive bowel gas pattern with scattered stool filled colon. Electronically Signed   By: Burman Nieves M.D.   On: 12/19/2022 20:15      LOS: 17 days    Jacquelin Hawking, MD [facility] 12/21/2022, 9:48 AM   If 7PM-7AM, please contact night-coverage [email or web address]

## 2022-12-21 NOTE — Progress Notes (Signed)
Marland Kitchen  HEMATOLOGY/ONCOLOGY INPATIENT PROGRESS NOTE  Date of Service: 12/21/2022   Inpatient Attending: .Narda Bonds, MD   SUBJECTIVE Patient noted to be more anemic and receiving PRBC transfusion. Receiving C1D15 of Velcade today. No other acute new symptoms  OBJECTIVE:  NAD  PHYSICAL EXAMINATION: . Vitals:   12/21/22 1145 12/21/22 1146 12/21/22 1200 12/21/22 1459  BP: 107/63 107/63 110/65 116/70  Pulse: 72 72 67 77  Resp: 18 18 18 18   Temp: 97.9 F (36.6 C) 97.9 F (36.6 C) 98 F (36.7 C) 98.2 F (36.8 C)  TempSrc: Oral Oral Oral Oral  SpO2: 100%  100% 100%  Weight:      Height:       Filed Weights   12/19/22 0720 12/20/22 0500 12/21/22 0704  Weight: 149 lb 11.1 oz (67.9 kg) 148 lb 2.4 oz (67.2 kg) 154 lb 8.7 oz (70.1 kg)   .Body mass index is 24.94 kg/m.  GENERAL:alert,bed bound OROPHARYNX: Mucous membranes moist LYMPH:  no palpable lymphadenopathy in the cervical, axillary or inguinal LUNGS: clear to auscultation with normal respiratory effort HEART: regular rate & rhythm,  no murmurs and no lower extremity edema ABDOMEN: abdomen soft, non-tender, normoactive bowel sounds  PSYCH: alert & oriented x 3   MEDICAL HISTORY:  Past Medical History:  Diagnosis Date   Arthritis    "lower back" (12/18/2017)   Better eye: moderate vision impairment; lesser eye: blind    GERD (gastroesophageal reflux disease)    Glaucoma, both eyes    High cholesterol    Hypertension    Hypothyroidism    Irregular heartbeat    Legally blind    "both eyes; can see some out of left eye"   OSA (obstructive sleep apnea)    Thyroid disease    Type II diabetes mellitus (HCC)    Vitamin B12 deficiency     SURGICAL HISTORY: Past Surgical History:  Procedure Laterality Date   LEFT HEART CATH AND CORONARY ANGIOGRAPHY N/A 12/18/2017   Procedure: LEFT HEART CATH AND CORONARY ANGIOGRAPHY;  Surgeon: Elder Negus, MD;  Location: [facility];  Service: Cardiovascular;   Laterality: N/A;   NOSE SURGERY     "was crooked; they straightened it out" (12/18/2017)    SOCIAL HISTORY: Social History   Socioeconomic History   Marital status: Married    Spouse name: Not on file   Number of children: 0   Years of education: Not on file   Highest education level: Not on file  Occupational History   Occupation: retired  Tobacco Use   Smoking status: Never   Smokeless tobacco: Never  Vaping Use   Vaping status: Never Used  Substance and Sexual Activity   Alcohol use: Not Currently    Comment: occ   Drug use: Never   Sexual activity: Not Currently  Other Topics Concern   Not on file  Social History Narrative   Not on file   Social Determinants of Health   Financial Resource Strain: Low Risk  (10/10/2022)   Received from [facility], [facility]   Overall Financial Resource Strain (CARDIA)    Difficulty of Paying Living Expenses: Not very hard  Food Insecurity: Food Insecurity Present (12/06/2022)   Hunger Vital Sign    Worried About Running Out of Food in the Last Year: Often true    Ran Out of Food in the Last Year: Often true  Transportation Needs: No Transportation Needs (12/06/2022)   PRAPARE - Transportation    Lack  of Transportation (Medical): No    Lack of Transportation (Non-Medical): No  Physical Activity: Insufficiently Active (12/23/2020)   Received from [facility], [facility]   Exercise Vital Sign    Days of Exercise per Week: 2 days    Minutes of Exercise per Session: 10 min  Stress: No Stress Concern Present (12/23/2020)   Received from [facility], [facility] - Occupational Stress Questionnaire    Feeling of Stress : Not at all  Social Connections: Unknown (09/16/2021)   Received from [facility], [facility]   Social Network    Social Network: Not on file  Intimate Partner Violence: Patient Unable To Answer (12/06/2022)   Humiliation, Afraid, Rape, and Kick questionnaire     Fear of Current or Ex-Partner: Patient unable to answer    Emotionally Abused: Patient unable to answer    Physically Abused: Patient unable to answer    Sexually Abused: Patient unable to answer    FAMILY HISTORY: Family History  Problem Relation Age of Onset   Diabetes Mellitus II Mother 64   Diabetes Mellitus II Sister    Breast cancer Sister    Heart failure Sister 38    ALLERGIES:  is allergic to hctz [hydrochlorothiazide].  MEDICATIONS:  Scheduled Meds:  amLODipine  5 mg Oral Daily   brimonidine  1 drop Both Eyes TID   feeding supplement  237 mL Oral BID BM   ferrous sulfate  325 mg Oral QODAY   folic acid  1 mg Oral Daily   heparin injection (subcutaneous)  5,000 Units Subcutaneous Q8H   insulin aspart  0-15 Units Subcutaneous Q4H   latanoprost  1 drop Both Eyes QHS   levothyroxine  150 mcg Oral Q0600   metoprolol tartrate  25 mg Oral BID   pantoprazole  40 mg Oral BID   polyethylene glycol  17 g Oral BID   senna-docusate  1 tablet Oral BID   tamsulosin  0.4 mg Oral Daily   Continuous Infusions: PRN Meds:.bisacodyl, docusate sodium, haloperidol lactate, hydrALAZINE, HYDROmorphone (DILAUDID) injection, ondansetron (ZOFRAN) IV, ondansetron, mouth rinse, phenol, polyethylene glycol, simethicone  REVIEW OF SYSTEMS:    10 Point review of Systems was done is negative except as noted above.   LABORATORY DATA:  I have reviewed the data as listed .    Latest Ref Rng & Units 12/21/2022    5:03 AM 12/20/2022    5:13 AM 12/19/2022   11:46 AM  CBC  WBC 4.0 - 10.5 K/uL 3.3  3.7  3.3   Hemoglobin 13.0 - 17.0 g/dL 6.6  7.2  7.5   Hematocrit 39.0 - 52.0 % 21.5  23.4  24.4   Platelets 150 - 400 K/uL 104  99  100      .    Latest Ref Rng & Units 12/20/2022    5:13 AM 12/19/2022   11:46 AM 12/18/2022    5:39 AM  CMP  Glucose 70 - 99 mg/dL 782  956  213   BUN 8 - 23 mg/dL 29  36  52   Creatinine 0.61 - 1.24 mg/dL 0.86  5.78  4.69   Sodium 135 - 145 mmol/L 144  141   141   Potassium 3.5 - 5.1 mmol/L 4.1  4.2  3.8   Chloride 98 - 111 mmol/L 118  116  113   CO2 22 - 32 mmol/L 19  18  18    Calcium 8.9 - 10.3 mg/dL  7.7  7.7  7.8   Total Protein 6.5 - 8.1 g/dL 5.3  5.8  5.5   Total Bilirubin 0.3 - 1.2 mg/dL 0.8  0.7  0.5   Alkaline Phos 38 - 126 U/L 102  116  109   AST 15 - 41 U/L 25  29  31    ALT 0 - 44 U/L 28  31  36        RADIOGRAPHIC STUDIES: I have personally reviewed the radiological images as listed and agreed with the findings in the report. DG Abd 1 View  Result Date: 12/19/2022 CLINICAL DATA:  Abdominal distention. EXAM: ABDOMEN - 1 VIEW COMPARISON:  12/18/2022 FINDINGS: Gas and stool throughout the colon. Scattered gas-filled small bowel. No small or large bowel distention. No radiopaque stones. Prominent degenerative changes in the spine and hips. Multiple lucent lesions demonstrated throughout the spine and pelvis consistent with multiple myeloma. Lung bases are clear. IMPRESSION: Nonobstructive bowel gas pattern with scattered stool filled colon. Electronically Signed   By: Burman Nieves M.D.   On: 12/19/2022 20:15   DG Abd 1 View  Result Date: 12/18/2022 CLINICAL DATA:  Abdomen discomfort EXAM: ABDOMEN - 1 VIEW COMPARISON:  02/12/2013, CT 12/04/2022 FINDINGS: Interval mild air distension of small and large bowel with moderate feces in the right colon. Numerous lucent lesions in the pelvis corresponding to history of myeloma. IMPRESSION: Interval mild air distension of small and large bowel with moderate feces in the right colon. Question mild ileus, though consider radiographic follow-up to exclude developing bowel obstruction Electronically Signed   By: Jasmine Pang M.D.   On: 12/18/2022 19:42   CT Chest Wo Contrast  Result Date: 12/04/2022 CLINICAL DATA:  Blunt chest trauma. EXAM: CT CHEST, ABDOMEN AND PELVIS WITHOUT CONTRAST TECHNIQUE: Multidetector CT imaging of the chest, abdomen and pelvis was performed following the standard  protocol without IV contrast. RADIATION DOSE REDUCTION: This exam was performed according to the departmental dose-optimization program which includes automated exposure control, adjustment of the mA and/or kV according to patient size and/or use of iterative reconstruction technique. COMPARISON:  Chest CT dated 09/26/2022. FINDINGS: CT CHEST FINDINGS Cardiovascular: No thoracic aortic aneurysm. No pericardial effusion. Mediastinum/Nodes: No mass or enlarged lymph nodes within the mediastinum. No hemorrhage or edema is seen within the mediastinum. Esophagus is unremarkable. Trachea is unremarkable. Lungs/Pleura: Mild dependent atelectasis bilaterally. Lungs appear otherwise clear. No pleural effusion or hemothorax. No pneumothorax. Musculoskeletal: Innumerable lytic lesions throughout the thoracic spine and bilateral ribs, corresponding to patient's known multiple myeloma. Associated large expansile lesion within the anterior second RIGHT rib. Multiple associated pathologic rib fractures bilaterally, of uncertain age but likely chronic. Displaced fracture of the lower RIGHT scapula, also likely pathologic related to underlying lytic lesions (multiple myeloma), of uncertain age. No acute appearing fracture or subluxation within the thoracic spine. Large expansile mass involving the T4 vertebral body with soft tissue component extending into the central canal and LEFT neural foramen, with almost certain mass effect on the thoracic cord and exiting nerve root. CT ABDOMEN PELVIS FINDINGS Hepatobiliary: No focal liver abnormality is seen. Gallbladder is unremarkable. No perihepatic hematoma. No bile duct dilatation is seen. Pancreas: Partially infiltrated with fat but otherwise unremarkable. Spleen: No splenic injury or perisplenic hematoma. Adrenals/Urinary Tract: Adrenal glands appear normal. No adrenal hemorrhage. No renal injury identified. No renal stone or hydronephrosis. No ureteral or bladder calculi are  identified. Bladder is unremarkable. Stomach/Bowel: No dilated large or small bowel loops. No evidence of bowel wall  inflammation or bowel wall injury. Appendix is normal. Stomach is unremarkable. Vascular/Lymphatic: No abdominal aortic aneurysm. No periaortic hemorrhage. No enlarged lymph nodes are seen in the abdomen or pelvis. Reproductive: Moderate prostate gland enlargement with some associated mass effect on the bladder base. Other: No free fluid or hemorrhage is seen within the abdomen or pelvis. No free intraperitoneal air. Musculoskeletal: Innumerable lytic lesions throughout the lumbar spine and osseous pelvis, corresponding to patient's known multiple myeloma. Questionable mild compression deformity of the L4 vertebral body, likely pathologic, nonacute and related to the underlying lytic changes of patient's multiple myeloma. No acute-appearing osseous abnormality is seen within the abdomen or pelvis. IMPRESSION: 1. Innumerable lytic lesions throughout the thoracic spine, bilateral ribs, lumbar spine and osseous pelvis, corresponding to patient's known multiple myeloma. 2. Large expansile mass involving the T4 vertebral body with soft tissue component extending into the central canal and LEFT neural foramen, with almost certain mass effect on the thoracic cord and exiting nerve root. Consider MRI of the thoracic spine without and with IV contrast to further evaluate. 3. Multiple associated pathologic rib fractures bilaterally, of uncertain age but likely chronic. 4. Displaced fracture of the lower RIGHT scapula, of uncertain age but also likely pathologic related to underlying lytic lesions (multiple myeloma). 5. Questionable mild compression deformity of the L4 vertebral body, likely pathologic, nonacute and related to the underlying lytic changes of patient's multiple myeloma. 6. No acute findings within the chest, abdomen or pelvis. No evidence of solid organ injury. No hemorrhage or edema within the  mediastinum. No pleural effusion or pneumothorax. No evidence of bowel wall injury. 7. Moderate prostate gland enlargement with some associated mass effect on the bladder base. Electronically Signed   By: Bary Richard M.D.   On: 12/04/2022 08:42   CT ABDOMEN PELVIS WO CONTRAST  Result Date: 12/04/2022 CLINICAL DATA:  Blunt chest trauma. EXAM: CT CHEST, ABDOMEN AND PELVIS WITHOUT CONTRAST TECHNIQUE: Multidetector CT imaging of the chest, abdomen and pelvis was performed following the standard protocol without IV contrast. RADIATION DOSE REDUCTION: This exam was performed according to the departmental dose-optimization program which includes automated exposure control, adjustment of the mA and/or kV according to patient size and/or use of iterative reconstruction technique. COMPARISON:  Chest CT dated 09/26/2022. FINDINGS: CT CHEST FINDINGS Cardiovascular: No thoracic aortic aneurysm. No pericardial effusion. Mediastinum/Nodes: No mass or enlarged lymph nodes within the mediastinum. No hemorrhage or edema is seen within the mediastinum. Esophagus is unremarkable. Trachea is unremarkable. Lungs/Pleura: Mild dependent atelectasis bilaterally. Lungs appear otherwise clear. No pleural effusion or hemothorax. No pneumothorax. Musculoskeletal: Innumerable lytic lesions throughout the thoracic spine and bilateral ribs, corresponding to patient's known multiple myeloma. Associated large expansile lesion within the anterior second RIGHT rib. Multiple associated pathologic rib fractures bilaterally, of uncertain age but likely chronic. Displaced fracture of the lower RIGHT scapula, also likely pathologic related to underlying lytic lesions (multiple myeloma), of uncertain age. No acute appearing fracture or subluxation within the thoracic spine. Large expansile mass involving the T4 vertebral body with soft tissue component extending into the central canal and LEFT neural foramen, with almost certain mass effect on the  thoracic cord and exiting nerve root. CT ABDOMEN PELVIS FINDINGS Hepatobiliary: No focal liver abnormality is seen. Gallbladder is unremarkable. No perihepatic hematoma. No bile duct dilatation is seen. Pancreas: Partially infiltrated with fat but otherwise unremarkable. Spleen: No splenic injury or perisplenic hematoma. Adrenals/Urinary Tract: Adrenal glands appear normal. No adrenal hemorrhage. No renal injury identified. No renal stone  or hydronephrosis. No ureteral or bladder calculi are identified. Bladder is unremarkable. Stomach/Bowel: No dilated large or small bowel loops. No evidence of bowel wall inflammation or bowel wall injury. Appendix is normal. Stomach is unremarkable. Vascular/Lymphatic: No abdominal aortic aneurysm. No periaortic hemorrhage. No enlarged lymph nodes are seen in the abdomen or pelvis. Reproductive: Moderate prostate gland enlargement with some associated mass effect on the bladder base. Other: No free fluid or hemorrhage is seen within the abdomen or pelvis. No free intraperitoneal air. Musculoskeletal: Innumerable lytic lesions throughout the lumbar spine and osseous pelvis, corresponding to patient's known multiple myeloma. Questionable mild compression deformity of the L4 vertebral body, likely pathologic, nonacute and related to the underlying lytic changes of patient's multiple myeloma. No acute-appearing osseous abnormality is seen within the abdomen or pelvis. IMPRESSION: 1. Innumerable lytic lesions throughout the thoracic spine, bilateral ribs, lumbar spine and osseous pelvis, corresponding to patient's known multiple myeloma. 2. Large expansile mass involving the T4 vertebral body with soft tissue component extending into the central canal and LEFT neural foramen, with almost certain mass effect on the thoracic cord and exiting nerve root. Consider MRI of the thoracic spine without and with IV contrast to further evaluate. 3. Multiple associated pathologic rib fractures  bilaterally, of uncertain age but likely chronic. 4. Displaced fracture of the lower RIGHT scapula, of uncertain age but also likely pathologic related to underlying lytic lesions (multiple myeloma). 5. Questionable mild compression deformity of the L4 vertebral body, likely pathologic, nonacute and related to the underlying lytic changes of patient's multiple myeloma. 6. No acute findings within the chest, abdomen or pelvis. No evidence of solid organ injury. No hemorrhage or edema within the mediastinum. No pleural effusion or pneumothorax. No evidence of bowel wall injury. 7. Moderate prostate gland enlargement with some associated mass effect on the bladder base. Electronically Signed   By: Bary Richard M.D.   On: 12/04/2022 08:42   CT L-SPINE NO CHARGE  Result Date: 12/04/2022 CLINICAL DATA:  Fall. EXAM: CT LUMBAR SPINE WITHOUT CONTRAST TECHNIQUE: Multidetector CT imaging of the lumbar spine was performed without intravenous contrast administration. Multiplanar CT image reconstructions were also generated. RADIATION DOSE REDUCTION: This exam was performed according to the departmental dose-optimization program which includes automated exposure control, adjustment of the mA and/or kV according to patient size and/or use of iterative reconstruction technique. COMPARISON:  Plain film of the lumbar spine dated 11/22/2015. FINDINGS: Segmentation: 5 lumbar type vertebrae. Alignment: Stable.  No evidence of acute vertebral body subluxation. Vertebrae: No fracture line or displaced fracture fragment is seen. Questionable mild compression of the L4 vertebral body which is likely nonacute. Innumerable lytic lesions throughout the lumbar spine, corresponding to patient's known multiple myeloma. The mild compression of the L4 vertebral body is likely related to the underlying lytic changes. Paraspinal and other soft tissues: Visualized immediate paravertebral soft tissues are unremarkable. Disc levels: Disc  desiccations at each level of the lumbar spine, with associated disc space narrowings and osteophyte formation, causing moderate to severe central canal stenoses at the L2-3 through L4-5 levels and mild-to-moderate central canal stenosis at L5-S1. Various degrees of neural foramen narrowing at each level of the lumbar spine as well, with possible associated nerve root impingement. IMPRESSION: 1. No acute fracture or subluxation within the lumbar spine. 2. Innumerable lytic lesions throughout the lumbar spine, corresponding to patient's known multiple myeloma. 3. Questionable interval mild compression of the L4 vertebral body which is likely nonacute and related to the underlying lytic  lesions (multiple myeloma). 4. Multilevel degenerative disc disease, causing moderate to severe central canal stenoses at the L2-3 through L4-5 levels and mild-to-moderate central canal stenosis at L5-S1, with possible associated nerve root impingement at 1 or more levels. Electronically Signed   By: Bary Richard M.D.   On: 12/04/2022 08:30   CT Head Wo Contrast  Result Date: 12/04/2022 CLINICAL DATA:  Head trauma, moderate-severe; Neck trauma (Age >= 65y) EXAM: CT HEAD WITHOUT CONTRAST CT CERVICAL SPINE WITHOUT CONTRAST TECHNIQUE: Multidetector CT imaging of the head and cervical spine was performed following the standard protocol without intravenous contrast. Multiplanar CT image reconstructions of the cervical spine were also generated. RADIATION DOSE REDUCTION: This exam was performed according to the departmental dose-optimization program which includes automated exposure control, adjustment of the mA and/or kV according to patient size and/or use of iterative reconstruction technique. COMPARISON:  None Available. FINDINGS: CT HEAD FINDINGS Brain: No hemorrhage. No extra-axial fluid collection. No hydrocephalus. No CT evidence of an acute cortical infarct. Mild chronic microvascular ischemic change. Vascular: No hyperdense  vessel or unexpected calcification. Skull: No acute fracture. There are nonspecific scattered lucent lesions throughout the calvarium, largest which is at the vertex (series 7, image 41). Sinuses/Orbits: No middle ear or mastoid effusion. Polypoid mucosal thickening in the right sphenoid left maxillary sinus. Orbits are unremarkable. Other: None. CT CERVICAL SPINE FINDINGS Alignment: Straightening of the normal cervical lordosis. Skull base and vertebrae: There is a mildly displaced fracture through the right C7 transverse process (series 5, image 30). 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 There are lytic lesions throughout the cervical spine involving every cervical vertebral body. Some of the lesions extend through the posterior cortex, for example at the right posterior C4 endplate (series 7, image 35) in the left aspect of T3 (series 3, image 2). Metastatic lesions are also seen in the bilateral scapula. Soft tissues and spinal canal: There is likely severe spinal canal stenosis at the T3 level secondary to epidural extension of tumor (series 9, image 1). Disc levels:  See above Upper chest: Negative. Other: None IMPRESSION: 1. No acute intracranial abnormality. 2. Mildly displaced fracture through the right C7 transverse process. 3. Multiple lytic lesions throughout the cervical spine, calvarium, and bilateral scapula. Findings could be seen in the setting of diffuse metastatic disease of myeloma. Some of the lesions extend through the posterior cortex, for example at the right posterior C4 endplate and left aspect of T3, with likely severe spinal canal stenosis at the T3 level secondary to epidural extension of tumor. Recommend MRI of the cervical and thoracic spine with and without contrast for further evaluation. Electronically Signed   By: Lorenza Cambridge M.D.   On: 12/04/2022 08:30   CT Cervical Spine Wo Contrast  Result Date: 12/04/2022 CLINICAL DATA:  Head trauma,  moderate-severe; Neck trauma (Age >= 65y) EXAM: CT HEAD WITHOUT CONTRAST CT CERVICAL SPINE WITHOUT CONTRAST TECHNIQUE: Multidetector CT imaging of the head and cervical spine was performed following the standard protocol without intravenous contrast. Multiplanar CT image reconstructions of the cervical spine were also generated. RADIATION DOSE REDUCTION: This exam was performed according to the departmental dose-optimization program which includes automated exposure control, adjustment of the mA and/or kV according to patient size and/or use of iterative reconstruction technique. COMPARISON:  None Available. FINDINGS: CT HEAD FINDINGS Brain: No hemorrhage. No extra-axial fluid collection. No hydrocephalus. No CT evidence of an acute cortical infarct. Mild chronic microvascular ischemic change. Vascular: No hyperdense vessel or unexpected  calcification. Skull: No acute fracture. There are nonspecific scattered lucent lesions throughout the calvarium, largest which is at the vertex (series 7, image 41). Sinuses/Orbits: No middle ear or mastoid effusion. Polypoid mucosal thickening in the right sphenoid left maxillary sinus. Orbits are unremarkable. Other: None. CT CERVICAL SPINE FINDINGS Alignment: Straightening of the normal cervical lordosis. Skull base and vertebrae: There is a mildly displaced fracture through the right C7 transverse process (series 5, image 30). 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 There are lytic lesions throughout the cervical spine involving every cervical vertebral body. Some of the lesions extend through the posterior cortex, for example at the right posterior C4 endplate (series 7, image 35) in the left aspect of T3 (series 3, image 2). Metastatic lesions are also seen in the bilateral scapula. Soft tissues and spinal canal: There is likely severe spinal canal stenosis at the T3 level secondary to epidural extension of tumor (series 9, image 1). Disc levels:   See above Upper chest: Negative. Other: None IMPRESSION: 1. No acute intracranial abnormality. 2. Mildly displaced fracture through the right C7 transverse process. 3. Multiple lytic lesions throughout the cervical spine, calvarium, and bilateral scapula. Findings could be seen in the setting of diffuse metastatic disease of myeloma. Some of the lesions extend through the posterior cortex, for example at the right posterior C4 endplate and left aspect of T3, with likely severe spinal canal stenosis at the T3 level secondary to epidural extension of tumor. Recommend MRI of the cervical and thoracic spine with and without contrast for further evaluation. Electronically Signed   By: Lorenza Cambridge M.D.   On: 12/04/2022 08:30   DG Chest Portable 1 View  Result Date: 12/04/2022 CLINICAL DATA:  Status post fall. EXAM: PORTABLE CHEST 1 VIEW COMPARISON:  PET-CT 10/06/2022 FINDINGS: Heart size and mediastinal contours are unremarkable. No pleural fluid or interstitial edema. No airspace consolidation. 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 right upper lobe lung mass is again seen measuring approximately 4.4 cm on today's study. Again seen are extensive, multifocal lytic bone metastases which involve bilateral ribs. Age indeterminate pathologic fractures involving the lateral aspect of the right fourth rib and left posterior sixth rib noted. IMPRESSION: 1. No acute cardiopulmonary disease. 2. Right upper lobe lung mass. 3. Extensive lytic bone metastases. Age indeterminate pathologic fractures involving the lateral aspect of the right fourth rib and left posterior sixth rib. Electronically Signed   By: Signa Kell M.D.   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  He had also been referred to social work at that time to help address his barriers of care. He is now admitted with progressive multiple myeloma.  Multiple myeloma, IgG kappa, diagnosed May 2024-untreated since patient did not follow-up now admitted with progressive myeloma with severe anemia and progressive renal failure. CTs 12/04/2022-innumerable lytic lesions, large expansile lesion in the anterior right second rib, multiple pathologic rib fractures, expansile T4 (T3 on CT cervical spine) mass with soft tissue component extending into the central canal and left neural foramen, C7 transverse process fracture Decadron daily x 4 starting 12/05/2022 Cycle 1 day 1 Velcade 12/07/2022 Decadron  daily x 4 starting 12/12/2022 Cycle 1 day 8 Velcade 12/14/2022 2.  Acute/chronic renal failure 3.  Severe anemia 4.  Mild thrombocytopenia 5.  Diabetes 6.  Hypertension 7.  Glaucoma 8.  OSA PLAN -labs noted -plz transfuse PRBC for Hgb<7.5 and would recommend transfusing to hgb of atleast 8 prior to discharge to SNF to allow for time to receive additional PRBC as outpatient. -patient appropriate to proceed with C1D15 Velcade today. Cannot give Daratumumab as inpatient. -Still awaiting SNF placement. --Skilled nursing facility will need to help the patient with transportation to cancer center for continued treatments. -will setup outpatient treatment plan on discharge  .The total time spent in the appointment was 25 minutes* .  All of the patient's questions were answered with apparent satisfaction. The patient knows to call the clinic with any problems, questions or concerns.   Wyvonnia Lora MD MS AAHIVMS [facility] Hematology/Oncology Physician [facility]  .*Total Encounter Time as defined by the Centers for Medicare and Medicaid Services includes, in addition to the face-to-face time of a  patient visit (documented in the note above) non-face-to-face time: obtaining and reviewing outside history, ordering and reviewing medications, tests or procedures, care coordination (communications with other health care professionals or caregivers) and documentation in the medical record.

## 2022-12-21 NOTE — Progress Notes (Signed)
[facility]  HEMATOLOGY/ONCOLOGY INPATIENT PROGRESS NOTE  Date of Service: 12/20/2022  Inpatient Attending: .Narda Bonds, MD   SUBJECTIVE  [name] was seen in hematology follow-up for his multiple myeloma.  We had planned to see him as outpatient but he is still awaiting placement at SNF at this time.  Renal function stable but hemoglobin down trending. We discussed that he is due for his next dose of weekly Velcade tomorrow on 12/21/2022.  We would like to start him on daratumumab as well but cannot do this as inpatient.  OBJECTIVE:  NAD  PHYSICAL EXAMINATION: . Vitals:   12/21/22 1145 12/21/22 1146 12/21/22 1200 12/21/22 1459  BP: 107/63 107/63 110/65 116/70  Pulse: 72 72 67 77  Resp: 18 18 18 18   Temp: 97.9 F (36.6 C) 97.9 F (36.6 C) 98 F (36.7 C) 98.2 F (36.8 C)  TempSrc: Oral Oral Oral Oral  SpO2: 100%  100% 100%  Weight:      Height:       Filed Weights   12/19/22 0720 12/20/22 0500 12/21/22 0704  Weight: 149 lb 11.1 oz (67.9 kg) 148 lb 2.4 oz (67.2 kg) 154 lb 8.7 oz (70.1 kg)   .Body mass index is 24.94 kg/m.  GENERAL:alert,bed bound OROPHARYNX: Mucous membranes moist NECK: supple, no JVD, thyroid normal size, non-tender, without nodularity LYMPH:  no palpable lymphadenopathy in the cervical, axillary or inguinal LUNGS: clear to auscultation with normal respiratory effort HEART: regular rate & rhythm,  no murmurs and no lower extremity edema ABDOMEN: abdomen soft, non-tender, normoactive bowel sounds  PSYCH: alert & oriented x 3   MEDICAL HISTORY:  Past Medical History:  Diagnosis Date   Arthritis    "lower back" (12/18/2017)   Better eye: moderate vision impairment; lesser eye: blind    GERD (gastroesophageal reflux disease)    Glaucoma, both eyes    High cholesterol    Hypertension    Hypothyroidism    Irregular heartbeat    Legally blind    "both eyes; can see some out of left eye"   OSA (obstructive sleep apnea)    Thyroid disease    Type  II diabetes mellitus (HCC)    Vitamin B12 deficiency     SURGICAL HISTORY: Past Surgical History:  Procedure Laterality Date   LEFT HEART CATH AND CORONARY ANGIOGRAPHY N/A 12/18/2017   Procedure: LEFT HEART CATH AND CORONARY ANGIOGRAPHY;  Surgeon: Elder Negus, MD;  Location: [facility];  Service: Cardiovascular;  Laterality: N/A;   NOSE SURGERY     "was crooked; they straightened it out" (12/18/2017)    SOCIAL HISTORY: Social History   Socioeconomic History   Marital status: Married    Spouse name: Not on file   Number of children: 0   Years of education: Not on file   Highest education level: Not on file  Occupational History   Occupation: retired  Tobacco Use   Smoking status: Never   Smokeless tobacco: Never  Vaping Use   Vaping status: Never Used  Substance and Sexual Activity   Alcohol use: Not Currently    Comment: occ   Drug use: Never   Sexual activity: Not Currently  Other Topics Concern   Not on file  Social History Narrative   Not on file   Social Determinants of Health   Financial Resource Strain: Low Risk  (10/10/2022)   Received from [facility], [facility]   Overall Financial Resource Strain (CARDIA)    Difficulty of  Paying Living Expenses: Not very hard  Food Insecurity: Food Insecurity Present (12/06/2022)   Hunger Vital Sign    Worried About Running Out of Food in the Last Year: Often true    Ran Out of Food in the Last Year: Often true  Transportation Needs: No Transportation Needs (12/06/2022)   PRAPARE - Administrator, Civil Service (Medical): No    Lack of Transportation (Non-Medical): No  Physical Activity: Insufficiently Active (12/23/2020)   Received from [facility], [facility]   Exercise Vital Sign    Days of Exercise per Week: 2 days    Minutes of Exercise per Session: 10 min  Stress: No Stress Concern Present (12/23/2020)   Received from [facility], [facility]  Occupational Health - Occupational Stress Questionnaire    Feeling of Stress : Not at all  Social Connections: Unknown (09/16/2021)   Received from [facility], [facility]   Social Network    Social Network: Not on file  Intimate Partner Violence: Patient Unable To Answer (12/06/2022)   Humiliation, Afraid, Rape, and Kick questionnaire    Fear of Current or Ex-Partner: Patient unable to answer    Emotionally Abused: Patient unable to answer    Physically Abused: Patient unable to answer    Sexually Abused: Patient unable to answer    FAMILY HISTORY: Family History  Problem Relation Age of Onset   Diabetes Mellitus II Mother 60   Diabetes Mellitus II Sister    Breast cancer Sister    Heart failure Sister 60    ALLERGIES:  is allergic to hctz [hydrochlorothiazide].  MEDICATIONS:  Scheduled Meds:  amLODipine  5 mg Oral Daily   bortezomib SQ  1.5 mg/m2 (Treatment Plan Recorded) Subcutaneous Once   brimonidine  1 drop Both Eyes TID   feeding supplement  237 mL Oral BID BM   ferrous sulfate  325 mg Oral QODAY   folic acid  1 mg Oral Daily   heparin injection (subcutaneous)  5,000 Units Subcutaneous Q8H   insulin aspart  0-15 Units Subcutaneous Q4H   latanoprost  1 drop Both Eyes QHS   levothyroxine  150 mcg Oral Q0600   metoprolol tartrate  25 mg Oral BID   pantoprazole  40 mg Oral BID   polyethylene glycol  17 g Oral BID   senna-docusate  1 tablet Oral BID   tamsulosin  0.4 mg Oral Daily   Continuous Infusions: PRN Meds:.bisacodyl, docusate sodium, haloperidol lactate, hydrALAZINE, HYDROmorphone (DILAUDID) injection, ondansetron (ZOFRAN) IV, ondansetron, mouth rinse, phenol, polyethylene glycol, simethicone  REVIEW OF SYSTEMS:    10 Point review of Systems was done is negative except as noted above.   LABORATORY DATA:  I have reviewed the data as listed .    Latest Ref Rng & Units 12/20/2022    5:13 AM 12/19/2022   11:46 AM  CBC  WBC 4.0 - 10.5 K/uL 3.7  3.3    Hemoglobin 13.0 - 17.0 g/dL 7.2  7.5   Hematocrit 62.1 - 52.0 % 23.4  24.4   Platelets 150 - 400 K/uL 99  100    .    Latest Ref Rng & Units 12/20/2022    5:13 AM 12/19/2022   11:46 AM 12/18/2022    5:39 AM  CMP  Glucose 70 - 99 mg/dL 308  657  846   BUN 8 - 23 mg/dL 29  36  52   Creatinine 0.61 - 1.24 mg/dL 9.62  2.12  2.29   Sodium 135 - 145 mmol/L 144  141  141   Potassium 3.5 - 5.1 mmol/L 4.1  4.2  3.8   Chloride 98 - 111 mmol/L 118  116  113   CO2 22 - 32 mmol/L 19  18  18    Calcium 8.9 - 10.3 mg/dL 7.7  7.7  7.8   Total Protein 6.5 - 8.1 g/dL 5.3  5.8  5.5   Total Bilirubin 0.3 - 1.2 mg/dL 0.8  0.7  0.5   Alkaline Phos 38 - 126 U/L 102  116  109   AST 15 - 41 U/L 25  29  31    ALT 0 - 44 U/L 28  31  36        RADIOGRAPHIC STUDIES: I have personally reviewed the radiological images as listed and agreed with the findings in the report. DG Abd 1 View  Result Date: 12/19/2022 CLINICAL DATA:  Abdominal distention. EXAM: ABDOMEN - 1 VIEW COMPARISON:  12/18/2022 FINDINGS: Gas and stool throughout the colon. Scattered gas-filled small bowel. No small or large bowel distention. No radiopaque stones. Prominent degenerative changes in the spine and hips. Multiple lucent lesions demonstrated throughout the spine and pelvis consistent with multiple myeloma. Lung bases are clear. IMPRESSION: Nonobstructive bowel gas pattern with scattered stool filled colon. Electronically Signed   By: Burman Nieves M.D.   On: 12/19/2022 20:15   DG Abd 1 View  Result Date: 12/18/2022 CLINICAL DATA:  Abdomen discomfort EXAM: ABDOMEN - 1 VIEW COMPARISON:  02/12/2013, CT 12/04/2022 FINDINGS: Interval mild air distension of small and large bowel with moderate feces in the right colon. Numerous lucent lesions in the pelvis corresponding to history of myeloma. IMPRESSION: Interval mild air distension of small and large bowel with moderate feces in the right colon. Question mild ileus, though consider  radiographic follow-up to exclude developing bowel obstruction Electronically Signed   By: Jasmine Pang M.D.   On: 12/18/2022 19:42   CT Chest Wo Contrast  Result Date: 12/04/2022 CLINICAL DATA:  Blunt chest trauma. EXAM: CT CHEST, ABDOMEN AND PELVIS WITHOUT CONTRAST TECHNIQUE: Multidetector CT imaging of the chest, abdomen and pelvis was performed following the standard protocol without IV contrast. RADIATION DOSE REDUCTION: This exam was performed according to the departmental dose-optimization program which includes automated exposure control, adjustment of the mA and/or kV according to patient size and/or use of iterative reconstruction technique. COMPARISON:  Chest CT dated 09/26/2022. FINDINGS: CT CHEST FINDINGS Cardiovascular: No thoracic aortic aneurysm. No pericardial effusion. Mediastinum/Nodes: No mass or enlarged lymph nodes within the mediastinum. No hemorrhage or edema is seen within the mediastinum. Esophagus is unremarkable. Trachea is unremarkable. Lungs/Pleura: Mild dependent atelectasis bilaterally. Lungs appear otherwise clear. No pleural effusion or hemothorax. No pneumothorax. Musculoskeletal: Innumerable lytic lesions throughout the thoracic spine and bilateral ribs, corresponding to patient's known multiple myeloma. Associated large expansile lesion within the anterior second RIGHT rib. Multiple associated pathologic rib fractures bilaterally, of uncertain age but likely chronic. Displaced fracture of the lower RIGHT scapula, also likely pathologic related to underlying lytic lesions (multiple myeloma), of uncertain age. No acute appearing fracture or subluxation within the thoracic spine. Large expansile mass involving the T4 vertebral body with soft tissue component extending into the central canal and LEFT neural foramen, with almost certain mass effect on the thoracic cord and exiting nerve root. CT ABDOMEN PELVIS FINDINGS Hepatobiliary: No focal liver abnormality is seen.  Gallbladder is unremarkable. No perihepatic hematoma. No bile duct dilatation  is seen. Pancreas: Partially infiltrated with fat but otherwise unremarkable. Spleen: No splenic injury or perisplenic hematoma. Adrenals/Urinary Tract: Adrenal glands appear normal. No adrenal hemorrhage. No renal injury identified. No renal stone or hydronephrosis. No ureteral or bladder calculi are identified. Bladder is unremarkable. Stomach/Bowel: No dilated large or small bowel loops. No evidence of bowel wall inflammation or bowel wall injury. Appendix is normal. Stomach is unremarkable. Vascular/Lymphatic: No abdominal aortic aneurysm. No periaortic hemorrhage. No enlarged lymph nodes are seen in the abdomen or pelvis. Reproductive: Moderate prostate gland enlargement with some associated mass effect on the bladder base. Other: No free fluid or hemorrhage is seen within the abdomen or pelvis. No free intraperitoneal air. Musculoskeletal: Innumerable lytic lesions throughout the lumbar spine and osseous pelvis, corresponding to patient's known multiple myeloma. Questionable mild compression deformity of the L4 vertebral body, likely pathologic, nonacute and related to the underlying lytic changes of patient's multiple myeloma. No acute-appearing osseous abnormality is seen within the abdomen or pelvis. IMPRESSION: 1. Innumerable lytic lesions throughout the thoracic spine, bilateral ribs, lumbar spine and osseous pelvis, corresponding to patient's known multiple myeloma. 2. Large expansile mass involving the T4 vertebral body with soft tissue component extending into the central canal and LEFT neural foramen, with almost certain mass effect on the thoracic cord and exiting nerve root. Consider MRI of the thoracic spine without and with IV contrast to further evaluate. 3. Multiple associated pathologic rib fractures bilaterally, of uncertain age but likely chronic. 4. Displaced fracture of the lower RIGHT scapula, of uncertain age  but also likely pathologic related to underlying lytic lesions (multiple myeloma). 5. Questionable mild compression deformity of the L4 vertebral body, likely pathologic, nonacute and related to the underlying lytic changes of patient's multiple myeloma. 6. No acute findings within the chest, abdomen or pelvis. No evidence of solid organ injury. No hemorrhage or edema within the mediastinum. No pleural effusion or pneumothorax. No evidence of bowel wall injury. 7. Moderate prostate gland enlargement with some associated mass effect on the bladder base. Electronically Signed   By: Bary Richard M.D.   On: 12/04/2022 08:42   CT ABDOMEN PELVIS WO CONTRAST  Result Date: 12/04/2022 CLINICAL DATA:  Blunt chest trauma. EXAM: CT CHEST, ABDOMEN AND PELVIS WITHOUT CONTRAST TECHNIQUE: Multidetector CT imaging of the chest, abdomen and pelvis was performed following the standard protocol without IV contrast. RADIATION DOSE REDUCTION: This exam was performed according to the departmental dose-optimization program which includes automated exposure control, adjustment of the mA and/or kV according to patient size and/or use of iterative reconstruction technique. COMPARISON:  Chest CT dated 09/26/2022. FINDINGS: CT CHEST FINDINGS Cardiovascular: No thoracic aortic aneurysm. No pericardial effusion. Mediastinum/Nodes: No mass or enlarged lymph nodes within the mediastinum. No hemorrhage or edema is seen within the mediastinum. Esophagus is unremarkable. Trachea is unremarkable. Lungs/Pleura: Mild dependent atelectasis bilaterally. Lungs appear otherwise clear. No pleural effusion or hemothorax. No pneumothorax. Musculoskeletal: Innumerable lytic lesions throughout the thoracic spine and bilateral ribs, corresponding to patient's known multiple myeloma. Associated large expansile lesion within the anterior second RIGHT rib. Multiple associated pathologic rib fractures bilaterally, of uncertain age but likely chronic. Displaced  fracture of the lower RIGHT scapula, also likely pathologic related to underlying lytic lesions (multiple myeloma), of uncertain age. No acute appearing fracture or subluxation within the thoracic spine. Large expansile mass involving the T4 vertebral body with soft tissue component extending into the central canal and LEFT neural foramen, with almost certain mass effect on the thoracic cord  and exiting nerve root. CT ABDOMEN PELVIS FINDINGS Hepatobiliary: No focal liver abnormality is seen. Gallbladder is unremarkable. No perihepatic hematoma. No bile duct dilatation is seen. Pancreas: Partially infiltrated with fat but otherwise unremarkable. Spleen: No splenic injury or perisplenic hematoma. Adrenals/Urinary Tract: Adrenal glands appear normal. No adrenal hemorrhage. No renal injury identified. No renal stone or hydronephrosis. No ureteral or bladder calculi are identified. Bladder is unremarkable. Stomach/Bowel: No dilated large or small bowel loops. No evidence of bowel wall inflammation or bowel wall injury. Appendix is normal. Stomach is unremarkable. Vascular/Lymphatic: No abdominal aortic aneurysm. No periaortic hemorrhage. No enlarged lymph nodes are seen in the abdomen or pelvis. Reproductive: Moderate prostate gland enlargement with some associated mass effect on the bladder base. Other: No free fluid or hemorrhage is seen within the abdomen or pelvis. No free intraperitoneal air. Musculoskeletal: Innumerable lytic lesions throughout the lumbar spine and osseous pelvis, corresponding to patient's known multiple myeloma. Questionable mild compression deformity of the L4 vertebral body, likely pathologic, nonacute and related to the underlying lytic changes of patient's multiple myeloma. No acute-appearing osseous abnormality is seen within the abdomen or pelvis. IMPRESSION: 1. Innumerable lytic lesions throughout the thoracic spine, bilateral ribs, lumbar spine and osseous pelvis, corresponding to  patient's known multiple myeloma. 2. Large expansile mass involving the T4 vertebral body with soft tissue component extending into the central canal and LEFT neural foramen, with almost certain mass effect on the thoracic cord and exiting nerve root. Consider MRI of the thoracic spine without and with IV contrast to further evaluate. 3. Multiple associated pathologic rib fractures bilaterally, of uncertain age but likely chronic. 4. Displaced fracture of the lower RIGHT scapula, of uncertain age but also likely pathologic related to underlying lytic lesions (multiple myeloma). 5. Questionable mild compression deformity of the L4 vertebral body, likely pathologic, nonacute and related to the underlying lytic changes of patient's multiple myeloma. 6. No acute findings within the chest, abdomen or pelvis. No evidence of solid organ injury. No hemorrhage or edema within the mediastinum. No pleural effusion or pneumothorax. No evidence of bowel wall injury. 7. Moderate prostate gland enlargement with some associated mass effect on the bladder base. Electronically Signed   By: Bary Richard M.D.   On: 12/04/2022 08:42   CT L-SPINE NO CHARGE  Result Date: 12/04/2022 CLINICAL DATA:  Fall. EXAM: CT LUMBAR SPINE WITHOUT CONTRAST TECHNIQUE: Multidetector CT imaging of the lumbar spine was performed without intravenous contrast administration. Multiplanar CT image reconstructions were also generated. RADIATION DOSE REDUCTION: This exam was performed according to the departmental dose-optimization program which includes automated exposure control, adjustment of the mA and/or kV according to patient size and/or use of iterative reconstruction technique. COMPARISON:  Plain film of the lumbar spine dated 11/22/2015. FINDINGS: Segmentation: 5 lumbar type vertebrae. Alignment: Stable.  No evidence of acute vertebral body subluxation. Vertebrae: No fracture line or displaced fracture fragment is seen. Questionable mild  compression of the L4 vertebral body which is likely nonacute. Innumerable lytic lesions throughout the lumbar spine, corresponding to patient's known multiple myeloma. The mild compression of the L4 vertebral body is likely related to the underlying lytic changes. Paraspinal and other soft tissues: Visualized immediate paravertebral soft tissues are unremarkable. Disc levels: Disc desiccations at each level of the lumbar spine, with associated disc space narrowings and osteophyte formation, causing moderate to severe central canal stenoses at the L2-3 through L4-5 levels and mild-to-moderate central canal stenosis at L5-S1. Various degrees of neural foramen narrowing at each  level of the lumbar spine as well, with possible associated nerve root impingement. IMPRESSION: 1. No acute fracture or subluxation within the lumbar spine. 2. Innumerable lytic lesions throughout the lumbar spine, corresponding to patient's known multiple myeloma. 3. Questionable interval mild compression of the L4 vertebral body which is likely nonacute and related to the underlying lytic lesions (multiple myeloma). 4. Multilevel degenerative disc disease, causing moderate to severe central canal stenoses at the L2-3 through L4-5 levels and mild-to-moderate central canal stenosis at L5-S1, with possible associated nerve root impingement at 1 or more levels. Electronically Signed   By: Bary Richard M.D.   On: 12/04/2022 08:30   CT Head Wo Contrast  Result Date: 12/04/2022 CLINICAL DATA:  Head trauma, moderate-severe; Neck trauma (Age >= 65y) EXAM: CT HEAD WITHOUT CONTRAST CT CERVICAL SPINE WITHOUT CONTRAST TECHNIQUE: Multidetector CT imaging of the head and cervical spine was performed following the standard protocol without intravenous contrast. Multiplanar CT image reconstructions of the cervical spine were also generated. RADIATION DOSE REDUCTION: This exam was performed according to the departmental dose-optimization program which  includes automated exposure control, adjustment of the mA and/or kV according to patient size and/or use of iterative reconstruction technique. COMPARISON:  None Available. FINDINGS: CT HEAD FINDINGS Brain: No hemorrhage. No extra-axial fluid collection. No hydrocephalus. No CT evidence of an acute cortical infarct. Mild chronic microvascular ischemic change. Vascular: No hyperdense vessel or unexpected calcification. Skull: No acute fracture. There are nonspecific scattered lucent lesions throughout the calvarium, largest which is at the vertex (series 7, image 41). Sinuses/Orbits: No middle ear or mastoid effusion. Polypoid mucosal thickening in the right sphenoid left maxillary sinus. Orbits are unremarkable. Other: None. CT CERVICAL SPINE FINDINGS Alignment: Straightening of the normal cervical lordosis. Skull base and vertebrae: There is a mildly displaced fracture through the right C7 transverse process (series 5, image 30). 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 There are lytic lesions throughout the cervical spine involving every cervical vertebral body. Some of the lesions extend through the posterior cortex, for example at the right posterior C4 endplate (series 7, image 35) in the left aspect of T3 (series 3, image 2). Metastatic lesions are also seen in the bilateral scapula. Soft tissues and spinal canal: There is likely severe spinal canal stenosis at the T3 level secondary to epidural extension of tumor (series 9, image 1). Disc levels:  See above Upper chest: Negative. Other: None IMPRESSION: 1. No acute intracranial abnormality. 2. Mildly displaced fracture through the right C7 transverse process. 3. Multiple lytic lesions throughout the cervical spine, calvarium, and bilateral scapula. Findings could be seen in the setting of diffuse metastatic disease of myeloma. Some of the lesions extend through the posterior cortex, for example at the right posterior C4 endplate and  left aspect of T3, with likely severe spinal canal stenosis at the T3 level secondary to epidural extension of tumor. Recommend MRI of the cervical and thoracic spine with and without contrast for further evaluation. Electronically Signed   By: Lorenza Cambridge M.D.   On: 12/04/2022 08:30   CT Cervical Spine Wo Contrast  Result Date: 12/04/2022 CLINICAL DATA:  Head trauma, moderate-severe; Neck trauma (Age >= 65y) EXAM: CT HEAD WITHOUT CONTRAST CT CERVICAL SPINE WITHOUT CONTRAST TECHNIQUE: Multidetector CT imaging of the head and cervical spine was performed following the standard protocol without intravenous contrast. Multiplanar CT image reconstructions of the cervical spine were also generated. RADIATION DOSE REDUCTION: This exam was performed according to the departmental dose-optimization  program which includes automated exposure control, adjustment of the mA and/or kV according to patient size and/or use of iterative reconstruction technique. COMPARISON:  None Available. FINDINGS: CT HEAD FINDINGS Brain: No hemorrhage. No extra-axial fluid collection. No hydrocephalus. No CT evidence of an acute cortical infarct. Mild chronic microvascular ischemic change. Vascular: No hyperdense vessel or unexpected calcification. Skull: No acute fracture. There are nonspecific scattered lucent lesions throughout the calvarium, largest which is at the vertex (series 7, image 41). Sinuses/Orbits: No middle ear or mastoid effusion. Polypoid mucosal thickening in the right sphenoid left maxillary sinus. Orbits are unremarkable. Other: None. CT CERVICAL SPINE FINDINGS Alignment: Straightening of the normal cervical lordosis. Skull base and vertebrae: There is a mildly displaced fracture through the right C7 transverse process (series 5, image 30). 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 There are lytic lesions throughout the cervical spine involving every cervical vertebral body. Some of the lesions  extend through the posterior cortex, for example at the right posterior C4 endplate (series 7, image 35) in the left aspect of T3 (series 3, image 2). Metastatic lesions are also seen in the bilateral scapula. Soft tissues and spinal canal: There is likely severe spinal canal stenosis at the T3 level secondary to epidural extension of tumor (series 9, image 1). Disc levels:  See above Upper chest: Negative. Other: None IMPRESSION: 1. No acute intracranial abnormality. 2. Mildly displaced fracture through the right C7 transverse process. 3. Multiple lytic lesions throughout the cervical spine, calvarium, and bilateral scapula. Findings could be seen in the setting of diffuse metastatic disease of myeloma. Some of the lesions extend through the posterior cortex, for example at the right posterior C4 endplate and left aspect of T3, with likely severe spinal canal stenosis at the T3 level secondary to epidural extension of tumor. Recommend MRI of the cervical and thoracic spine with and without contrast for further evaluation. Electronically Signed   By: Lorenza Cambridge M.D.   On: 12/04/2022 08:30   DG Chest Portable 1 View  Result Date: 12/04/2022 CLINICAL DATA:  Status post fall. EXAM: PORTABLE CHEST 1 VIEW COMPARISON:  PET-CT 10/06/2022 FINDINGS: Heart size and mediastinal contours are unremarkable. No pleural fluid or interstitial edema. No airspace consolidation. 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 right upper lobe lung mass is again seen measuring approximately 4.4 cm on today's study. Again seen are extensive, multifocal lytic bone metastases which involve bilateral ribs. Age indeterminate pathologic fractures involving the lateral aspect of the right fourth rib and left posterior sixth rib noted. IMPRESSION: 1. No acute cardiopulmonary disease. 2. Right upper lobe lung mass. 3. Extensive lytic bone metastases. Age indeterminate pathologic fractures involving the lateral aspect of the right fourth rib and  left posterior sixth rib. Electronically Signed   By: Signa Kell M.D.   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  He had also been referred to social work at that time to help address his barriers of care. He is now admitted with progressive multiple myeloma.  Multiple myeloma, IgG kappa, diagnosed May 2024-untreated since patient did not follow-up now admitted with progressive myeloma with severe anemia and  progressive renal failure. CTs 12/04/2022-innumerable lytic lesions, large expansile lesion in the anterior right second rib, multiple pathologic rib fractures, expansile T4 (T3 on CT cervical spine) mass with soft tissue component extending into the central canal and left neural foramen, C7 transverse process fracture Decadron daily x 4 starting 12/05/2022 Cycle 1 day 1 Velcade 12/07/2022 Decadron daily x 4 starting 12/12/2022 Cycle 1 day 8 Velcade 12/14/2022 2.  Acute/chronic renal failure 3.  Severe anemia 4.  Mild thrombocytopenia 5.  Diabetes 6.  Hypertension 7.  Glaucoma 8.  OSA PLAN -Patient's labs were reviewed with him. -He is still bedbound and feels quite weak. -Still awaiting SNF placement. -We had scheduled him for outpatient continued treatment but he still remains in the hospital and so we will need to get his cycle 1 day 15 of Velcade tomorrow on 12/21/2022.  We want to start him on daratumumab but cannot do this as inpatient. -His hemoglobin levels are trending down and he might need transfusion support to try to maintain his hemoglobin more than 7.5. -Ongoing goals of care discussion. -Realistically with his extensive  myeloma the type of treatment he will need will need significant outpatient care coordination and multiple cancer center visits for transfusion management and continued treatment with daratumumab Velcade dexamethasone with a plan to add Cytoxan if tolerated. -Patient notes he still wants to try this approach and is still agreeable to go to a SNF. -Skilled nursing facility will need to help the patient with transportation to cancer center for continued treatments.  The total time spent in the appointment was 35 minutes* .  All of the patient's questions were answered with apparent satisfaction. The patient knows to call the clinic with any problems, questions or concerns.   Wyvonnia Lora MD MS AAHIVMS [facility] Hematology/Oncology Physician [facility]  .*Total Encounter Time as defined by the Centers for Medicare and Medicaid Services includes, in addition to the face-to-face time of a patient visit (documented in the note above) non-face-to-face time: obtaining and reviewing outside history, ordering and reviewing medications, tests or procedures, care coordination (communications with other health care professionals or caregivers) and documentation in the medical record.

## 2022-12-21 NOTE — Progress Notes (Signed)
PT Cancellation Note  Patient Details Name: NANG HAVEL MRN: [id number] DOB: 01-23-1951   Cancelled Treatment:    Reason Eval/Treat Not Completed: Medical issues which prohibited therapy (Low Hgb, to receive 1 unit PRBCs)   Kati L Payson 12/21/2022, 11:16 AM Paulino Door, DPT Physical Therapist Acute Rehabilitation Services Office: [phone]

## 2022-12-21 NOTE — Progress Notes (Signed)
    Patient Name: Scott Bass           DOB: 12/16/1950  MRN: [id number]      Admission Date: 12/04/2022  Attending Provider: Narda Bonds, MD  Primary Diagnosis: Acute on chronic renal failure [facility])   Level of care: Telemetry    CROSS COVER NOTE   Date of Service   12/21/2022   Scott Bass, 72 y.o. male, was admitted on 12/04/2022 for Acute on chronic renal failure (HCC).    HPI/Events of Note   Acute Anemia Hemoglobin  6.6.  No acute changes reported.  Hemodynamically stable. No melena, hematochezia, or other bleeding reported tonight.    Interventions/ Plan   Blood transfusion, 1 unit PRBC        Anthoney Harada, DNP, [employer] [facility] [place]

## 2022-12-21 NOTE — Progress Notes (Signed)
Chemo RN presented to patient room. Patient verified with 2 separate identifiers. Role of Chemo RN explained. Verified consent, already obtained for this hospitalization. Treatment completed without issue. All questions answered to patient's satisfaction. Primary RN made aware treatment complete. Chemo RN left patient in stable condition.

## 2022-12-21 NOTE — Plan of Care (Signed)

## 2022-12-21 NOTE — Progress Notes (Signed)
Patient refuses to drink nectar thicken liquids. Patient does like Ensures offered that to patient instead of thin liquids. Patient will need further education on the importance of drinking nectar liquids.

## 2022-12-21 NOTE — Progress Notes (Signed)
[name] Liaison note:       Notified by [facility] manager of patient/family request for [facility] Palliative services at home after discharge.              Hospital liaison will follow patient for discharge disposition.       Please call with any hospice or outpatient palliative care related questions.         Thank you for the opportunity to participate in this patient's care.  Thea Gist, BSN RN [phone]

## 2022-12-22 ENCOUNTER — Encounter: Payer: Self-pay | Admitting: Hematology

## 2022-12-22 DIAGNOSIS — N184 Chronic kidney disease, stage 4 (severe): Secondary | ICD-10-CM | POA: Diagnosis not present

## 2022-12-22 DIAGNOSIS — N170 Acute kidney failure with tubular necrosis: Secondary | ICD-10-CM | POA: Diagnosis not present

## 2022-12-22 LAB — CBC
HCT: 26.5 % — ABNORMAL LOW (ref 39.0–52.0)
Hemoglobin: 8.2 g/dL — ABNORMAL LOW (ref 13.0–17.0)
MCH: 29.5 pg (ref 26.0–34.0)
MCHC: 30.9 g/dL (ref 30.0–36.0)
MCV: 95.3 fL (ref 80.0–100.0)
Platelets: 111 10*3/uL — ABNORMAL LOW (ref 150–400)
RBC: 2.78 MIL/uL — ABNORMAL LOW (ref 4.22–5.81)
RDW: 18.1 % — ABNORMAL HIGH (ref 11.5–15.5)
WBC: 5.3 10*3/uL (ref 4.0–10.5)
nRBC: 0.6 % — ABNORMAL HIGH (ref 0.0–0.2)

## 2022-12-22 LAB — GLUCOSE, CAPILLARY
Glucose-Capillary: 133 mg/dL — ABNORMAL HIGH (ref 70–99)
Glucose-Capillary: 141 mg/dL — ABNORMAL HIGH (ref 70–99)
Glucose-Capillary: 166 mg/dL — ABNORMAL HIGH (ref 70–99)
Glucose-Capillary: 196 mg/dL — ABNORMAL HIGH (ref 70–99)
Glucose-Capillary: 202 mg/dL — ABNORMAL HIGH (ref 70–99)
Glucose-Capillary: 84 mg/dL (ref 70–99)
Glucose-Capillary: 96 mg/dL (ref 70–99)

## 2022-12-22 LAB — HEMOGLOBIN AND HEMATOCRIT, BLOOD
HCT: 30 % — ABNORMAL LOW (ref 39.0–52.0)
Hemoglobin: 9.2 g/dL — ABNORMAL LOW (ref 13.0–17.0)

## 2022-12-22 LAB — TYPE AND SCREEN
ABO/RH(D): B POS
Antibody Screen: NEGATIVE
Unit division: 0

## 2022-12-22 LAB — BPAM RBC
Blood Product Expiration Date: 202409162359
ISSUE DATE / TIME: 202408151135
Unit Type and Rh: 7300

## 2022-12-22 MED ORDER — SODIUM BICARBONATE 650 MG PO TABS
650.0000 mg | ORAL_TABLET | Freq: Two times a day (BID) | ORAL | Status: DC
  Administered 2022-12-22 – 2022-12-23 (×3): 650 mg via ORAL
  Filled 2022-12-22 (×3): qty 1

## 2022-12-22 MED ORDER — LOPERAMIDE HCL 2 MG PO CAPS
2.0000 mg | ORAL_CAPSULE | ORAL | Status: AC | PRN
  Administered 2022-12-22 – 2022-12-23 (×2): 2 mg via ORAL
  Filled 2022-12-22 (×3): qty 1

## 2022-12-22 NOTE — TOC Progression Note (Addendum)
Transition of Care [facility]) - Progression Note    Patient Details  Name: Scott Bass MRN: [id number] Date of Birth: [birth year]  Transition of Care [facility] CM/SW Contact  Otelia Santee, LCSW Phone Number: 12/22/2022, 10:27 AM  Clinical Narrative:    Pt's insurance auth for SNF expired. New insurance authorization has been requested and is currently pending. Per MD pt may be medically stable to transfer to SNF over the weekend.   ADDENDUM: Per Lacinda Axon they received email from [facility] stating that pt's lapsed insurance Berkley Harvey will still be valid if pt is able to admit to faciltiy by 8/18.    Expected Discharge Plan: IP Rehab Facility Barriers to Discharge: Continued Medical Work up  Expected Discharge Plan and Services In-house Referral: Clinical Social Work     Living arrangements for the past 2 months: Single Family Home                                       Social Determinants of Health (SDOH) Interventions SDOH Screenings   Food Insecurity: Food Insecurity Present (12/06/2022)  Housing: Low Risk  (12/06/2022)  Transportation Needs: No Transportation Needs (12/06/2022)  Utilities: Not At Risk (12/06/2022)  Financial Resource Strain: Low Risk  (10/10/2022)   Received from [facility]  Physical Activity: Insufficiently Active (12/23/2020)   Received from [facility], [facility]  Social Connections: Unknown (09/16/2021)   Received from [facility], [facility]  Stress: No Stress Concern Present (12/23/2020)   Received from [facility], [facility]  Tobacco Use: Low Risk  (12/06/2022)    Readmission Risk Interventions    12/19/2022   11:42 AM 12/07/2022   11:22 AM  Readmission Risk Prevention Plan  Transportation Screening Complete   Medication Review (RN Care Manager) Complete   PCP or Specialist appointment within 3-5 days of discharge Complete   HRI or Home Care Consult Complete Complete  SW Recovery Care/Counseling Consult Complete  Complete  Palliative Care Screening Not Applicable   Skilled Nursing Facility Complete

## 2022-12-22 NOTE — Progress Notes (Signed)
PROGRESS NOTE    [name]  VFI:[id number] DOB: June 12, 1950 DOA: 12/04/2022 PCP: Tracey Harries, MD   Brief Narrative: Scott Bass is a 72 y.o. male with a history of blindness, multiple myeloma not yet on therapy, hypertension, diabetes mellitus.  Patient presented secondary to a fall and was found to have severe acute on chronic renal failure with associated anemia and hemoglobin of 3.5.  Patient required ICU admission on presentation and was started on IV fluids for hydration in addition to receiving a total of 3 units of PRBC.  Nephrology was consulted for acute kidney injury concerning for possible complication of untreated known multiple myeloma in addition to hypovolemia/dehydration.  Patient was started empirically on antibiotics for possible infection with blood cultures significant for Staphylococcus capitis and 1 out of 4 bottles consistent with likely contaminant.  Renal function continued to improve during admission.  Patient started on chemotherapy for multiple myeloma while inpatient.   Assessment and Plan:  AKI on CKD stage IV Baseline creatinine of about 1.5-1.7. Creatinine of 11.76 on admission requiring ICU admission. Patient managed on IV fluids. Nephrology consulted with recommendation to defer hemodialysis secondary to patient's poor candidacy. Creatinine improved steadily with IV fluids and has continued to improve while off of IV fluids.  Multiple myeloma Diffuse pathological fractures Medical oncology consulted and started Velcade and Decadron while inpatient.  Pancytopenia WBC and platelets stable with unstable hemoglobin.  Hyperkalemia Likely secondary to AKI. Resolved.  Hyponatremia Resolved.  Hypernatremia Resolved.  Metabolic acidosis Secondary to AKI. Improved.  Acute anemia Likely secondary to multiple myeloma. Patient received a total of 4 units of PRBC via transfusion this admission. Hemoglobin stable after recent transfusion -Repeat  H&H this afternoon and CBC in AM  Dysphagia SLP recommendations: PO Diet Recommendation: Dysphagia 2 (Finely chopped);Mildly thick liquids (Level 2, nectar thick) Liquid Administration via: Cup;Straw Medication Administration: Crushed with puree Supervision: Full assist for feeding;Full supervision/cueing for swallowing strategies Swallowing strategies  : Minimize environmental distractions;Slow rate;Small bites/sips;effortful swallow;Clear throat intermittently;Multiple dry swallows after each bite/sip Postural changes: Position pt fully upright for meals  Tachycardia Resolved.  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 Deferred at this time secondary to renal function.  Acute metabolic encephalopathy Secondary to uremia. Improved.  Constipation -Continue MiraLAX and Senokot-S  Hypothyroidism -Continue Synthroid  Glaucoma -Continue Xalatan and Alphagan  Folic acid deficiency -Continue folic acid  Moderate malnutrition -Continue protein supplementation  Hypoalbuminemia Noted. Related to poor nutrition.   DVT prophylaxis: Heparin Code Status:   Code Status: DNR Family Communication: None at bedside Disposition Plan: Discharge to SNF likely in 24 hours if hemoglobin remains stable   Consultants:  Palliative care Psychiatry Nephrology PCCM  Procedures:    Antimicrobials:     Subjective: No issues this morning.   Objective: BP 107/61 (BP Location: Right Arm)   Pulse 85   Temp 98.5 F (36.9 C) (Oral)   Resp 16   Ht 5\' 6"  (1.676 m)   Wt 66.7 kg   SpO2 100%   BMI 23.73 kg/m   Examination:  General exam: Appears calm and comfortable Respiratory system: Clear to auscultation. Respiratory effort normal. Gastrointestinal system: Abdomen is nondistended, soft and nontender. Normal bowel sounds heard. Central nervous system: Alert and oriented.  Psychiatry: Judgement and insight appear normal.  Mood & affect appropriate.    Data Reviewed: I have personally reviewed following labs and imaging studies  CBC Lab Results  Component Value Date  WBC 5.3 12/22/2022   RBC 2.78 (L) 12/22/2022   HGB 8.2 (L) 12/22/2022   HCT 26.5 (L) 12/22/2022   MCV 95.3 12/22/2022   MCH 29.5 12/22/2022   PLT 111 (L) 12/22/2022   MCHC 30.9 12/22/2022   RDW 18.1 (H) 12/22/2022   LYMPHSABS 0.7 12/20/2022   MONOABS 0.5 12/20/2022   EOSABS 0.1 12/20/2022   BASOSABS 0.0 12/20/2022     Last metabolic panel Lab Results  Component Value Date   NA 137 12/21/2022   K 4.6 12/21/2022   CL 112 (H) 12/21/2022   CO2 18 (L) 12/21/2022   BUN 26 (H) 12/21/2022   CREATININE 1.83 (H) 12/21/2022   GLUCOSE 231 (H) 12/21/2022   GFRNONAA 39 (L) 12/21/2022   GFRAA >60 05/19/2015   CALCIUM 7.5 (L) 12/21/2022   PHOS 2.2 (L) 12/20/2022   PROT 5.3 (L) 12/20/2022   ALBUMIN 1.9 (L) 12/20/2022   LABGLOB 6.8 (H) 10/05/2022   AGRATIO 0.6 (L) 09/25/2022   BILITOT 0.8 12/20/2022   ALKPHOS 102 12/20/2022   AST 25 12/20/2022   ALT 28 12/20/2022   ANIONGAP 7 12/21/2022    GFR: Estimated Creatinine Clearance: 33.4 mL/min (A) (by C-G formula based on SCr of 1.83 mg/dL (H)).  No results found for this or any previous visit (from the past 240 hour(s)).    Radiology Studies: No results found.    LOS: 18 days    Jacquelin Hawking, MD [facility] 12/22/2022, 3:52 PM   If 7PM-7AM, please contact night-coverage [email or web address]

## 2022-12-22 NOTE — Progress Notes (Signed)
Occupational Therapy Treatment Patient Details Name: Scott Bass MRN: [id number] DOB: 10-27-1950 Today's Date: 12/22/2022   History of present illness 72 year old male presented for fall and found confused, tachycardic and constipated with labs significant for AKI with hyperkalemia and severe metabolic acidosis and Hg 3.5. LA 6.0;  CTs 12/04/2022-innumerable lytic lesions, large expansile lesion in the anterior right second rib, multiple pathologic rib fractures, expansile T4 (T3 on CT cervical spine) mass with soft tissue component extending into the central canal and left neural foramen, C7 transverse process fracture, displaced fracture of the lower RIGHT scapula, of uncertain age. PHMx: blindness (can see some, but not details), recently diagnosed multiple myeloma in May 2024 but has not started therapy (diffuse lesions) , HTN, DM   OT comments  Pt was cooperative, however expressed an interest in discharging from the hospital soon. He was seen for functional strengthening, progressing of out of bed activity, and ADL instruction. He performed toileting at bedside commode level; he was able to perform posterior peri-hygiene in standing after a bowel movement. He further performed grooming in standing at the sink, requiring steadying assist and intermittent directional cues, given his baseline vision impairment. He reported having increased back pain with activity, however reported improvement in pain while at rest in bed at the end of the session. He is making gradual functional progress. Continue OT plan of care. Patient will benefit from continued inpatient follow up therapy, <3 hours/day.       If plan is discharge home, recommend the following:  A little help with walking and/or transfers;A lot of help with bathing/dressing/bathroom;Assistance with cooking/housework;Assist for transportation;Help with stairs or ramp for entrance   Equipment Recommendations  BSC/3in1    Recommendations for  Other Services      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); Nectar Thicken Liquids Restrictions Weight Bearing Restrictions: No       Mobility Bed Mobility Overal bed mobility: Needs Assistance Bed Mobility: Supine to Sit, Sit to Supine     Supine to sit: Contact guard, HOB elevated, Used rails Sit to supine: Contact guard assist, Used rails        Transfers Overall transfer level: Needs assistance Equipment used: Rolling walker (2 wheels) Transfers: Sit to/from Stand Sit to Stand: Min assist Stand pivot transfers: Min assist                   ADL either performed or assessed with clinical judgement   ADL Overall ADL's : Needs assistance/impaired Eating/Feeding: Set up;Sitting Eating/Feeding Details (indicate cue type and reason): based on clinical judgement Grooming: Wash/dry face;Standing;Contact guard assist;Wash/dry hands Grooming Details (indicate cue type and reason): He performed face and hand washing in standing at sink level.         Upper Body Dressing : Minimal assistance;Sitting Upper Body Dressing Details (indicate cue type and reason): simulated Lower Body Dressing: Minimal assistance;Sit to/from stand   Toilet Transfer: Minimal assistance;Rolling walker (2 wheels);BSC/3in1;Stand-pivot Toilet Transfer Details (indicate cue type and reason): Pt transferrred onto and off the bedside commode, requiring steadying assist in standing & cues for walker placement and to reach back prior to sitting. Toileting- Clothing Manipulation and Hygiene: Minimal assistance;Sit to/from stand Toileting - Clothing Manipulation Details (indicate cue type and reason): He required uppper extremity support on the RW in standing, while he used his RUE to perform posterior peri-hygiene after having a bowel movement.  Vision Baseline Vision/History: 2 Legally blind             Cognition Arousal: Alert Behavior During Therapy: WFL for tasks assessed/performed Overall Cognitive Status: Within Functional Limits for tasks assessed                        Pertinent Vitals/ Pain       Pain Assessment Pain Assessment: Faces Pain Score: 4  Pain Location: upper back with activity Pain Intervention(s): Heat applied, Repositioned, Monitored during session         Frequency  Min 1X/week        Progress Toward Goals  OT Goals(current goals can now be found in the care plan section)  Progress towards OT goals: Progressing toward goals  Acute Rehab OT Goals Patient Stated Goal: To discharge from the hospital soon and to talk to his spouse on the phone. OT called pt's spouse so he could speak with her at the end of the session  Plan Discharge plan needs to be updated       AM-PAC OT "6 Clicks" Daily Activity     Outcome Measure   Help from another person eating meals?: A Little Help from another person taking care of personal grooming?: A Little Help from another person toileting, which includes using toliet, bedpan, or urinal?: A Little Help from another person bathing (including washing, rinsing, drying)?: A Lot Help from another person to put on and taking off regular upper body clothing?: A Little Help from another person to put on and taking off regular lower body clothing?: A Little 6 Click Score: 17    End of Session Equipment Utilized During Treatment: Rolling walker (2 wheels)  OT Visit Diagnosis: Unsteadiness on feet (R26.81);Muscle weakness (generalized) (M62.81)   Activity Tolerance Patient tolerated treatment well   Patient Left in bed;with call bell/phone within reach;with bed alarm set   Nurse Communication Other (comment) (nurse tech informed of pt having a bowel movement with bedside commode being placed in bathroom; she stated she would empty BSC)        Time: 2841-3244 OT Time Calculation (min): 28 min  Charges: OT  General Charges $OT Visit: 1 Visit OT Treatments $Self Care/Home Management : 8-22 mins $Therapeutic Activity: 8-22 mins     Reuben Likes, OTR/L 12/22/2022, 2:49 PM

## 2022-12-22 NOTE — Plan of Care (Signed)
  Problem: Education: Goal: Knowledge of General Education information will improve Description Including pain rating scale, medication(s)/side effects and non-pharmacologic comfort measures Outcome: Progressing   Problem: Clinical Measurements: Goal: Will remain free from infection Outcome: Progressing   Problem: Nutrition: Goal: Adequate nutrition will be maintained Outcome: Progressing   Problem: Coping: Goal: Level of anxiety will decrease Outcome: Progressing   

## 2022-12-22 NOTE — Progress Notes (Signed)
Physical Therapy Treatment Patient Details Name: Scott Bass MRN: [id number] DOB: 11-02-1950 Today's Date: 12/22/2022   History of Present Illness 72 year old male presented for fall and found confused, tachycardic and constipated with labs significant for AKI with hyperkalemia and severe metabolic acidosis and Hg 3.5. LA 6.0;  CTs 12/04/2022-innumerable lytic lesions, large expansile lesion in the anterior right second rib, multiple pathologic rib fractures, expansile T4 (T3 on CT cervical spine) mass with soft tissue component extending into the central canal and left neural foramen, C7 transverse process fracture, displaced fracture of the lower RIGHT scapula, of uncertain age. PHMx: blindness (can see some, but not details), recently diagnosed multiple myeloma in May 2024 but has not started therapy (diffuse lesions) , HTN, DM    PT Comments  General Comments: AxO x 3 pleasant and willing but "missing home" and his wife. Pt in bed incont BM.  Assisted OOB to [facility]  General bed mobility comments: VCs for hand placement (use of rail) due to low vision.  Assistedf on/off BSC twice for loose stools. General transfer comment: required increased time and Min Assist for balance esp with turns and side steps to [facility].  VC's on proper hand placement and direction due to low vision.  Also assisted with peri care as pt was unable to self perform and safely stand due to impaired balance and c/o weakness.  HIGH FALL RISK. General Gait Details: limited anb distance of 22 feet due to max c/o weakness and fatigue.  Recliner following for safety.  Unsteady balance with noted R and L drift with slow self correction.  HIGH FALL RISK. Prior to admit, pt was Indep with amb using a walker and living home with his Spouse.  Pt will need ST Rehab at SNF to address mobility and functional decline prior to safely returning home.    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  Assistedf on/off BSC twice for loose stools.    Transfers Overall transfer level: Needs assistance Equipment used: Rolling walker (2 wheels), [employer] Transfers: Sit to/from Stand Sit to Stand: Min assist Stand pivot transfers: Min assist         General transfer comment: required increased time and Min Assist for balance esp with turns and side steps to [facility].  VC's on proper hand placement and direction due to low vision.  Also assisted with peri care as pt was unable to self perform and safely stand due to impaired balance and c/o weakness.  HIGH FALL RISK.    Ambulation/Gait Ambulation/Gait assistance: Min assist, Mod assist Gait Distance (Feet): 22 Feet Assistive device: Rolling walker (2 wheels) Gait Pattern/deviations: Step-through pattern, Drifts right/left, Trunk flexed Gait velocity: decr     General Gait Details: limited anb distance of 22 feet due  to max c/o weakness and fatigue.  Recliner following for safety.  Unsteady balance with noted R and L drift with slow self correction.  HIGH FALL RISK.   Stairs             Wheelchair Mobility     Tilt Bed    Modified Rankin (Stroke Patients Only)       Balance                                             Cognition Arousal: Alert Behavior During Therapy: WFL for tasks assessed/performed Overall Cognitive Status: Within Functional Limits for tasks assessed                                 General Comments: AxO x 3 pleasant and willing but "missing home" and his wife.        Exercises      General Comments        Pertinent Vitals/Pain Pain Assessment Pain Assessment: No/denies pain    Home Living                          Prior Function            PT Goals (current goals can now be found in the care plan section) Progress towards PT goals: Progressing toward goals    Frequency    Min 1X/week      PT Plan Discharge plan needs to be updated    Co-evaluation              AM-PAC PT "6 Clicks" Mobility   Outcome Measure  Help needed turning from your back to your side while in a flat bed without using bedrails?: A Little Help needed moving from lying on your back to sitting on the side of a flat bed without using bedrails?: A Little Help needed moving to and from a bed to a chair (including a wheelchair)?: A Little Help needed standing up from a chair using your arms (e.g., wheelchair or bedside chair)?: A Little Help needed to walk in hospital room?: A Lot Help needed climbing 3-5 steps with a railing? : Total 6 Click Score: 15    End of Session Equipment Utilized During Treatment: Gait belt Activity Tolerance: Patient tolerated treatment well Patient left: in chair;with call bell/phone within reach;with chair alarm set Nurse Communication: Mobility status PT Visit Diagnosis: Unsteadiness on feet (R26.81);Other abnormalities of gait and mobility (R26.89);Difficulty in walking, not elsewhere classified (R26.2)     Time: 1610-9604 PT Time Calculation (min) (ACUTE ONLY): 1410 min  Charges:    $Gait Training: 8-22 mins $Therapeutic Activity: 8-22 mins PT General Charges $$ ACUTE PT VISIT: 1 Visit                      Felecia Shelling  PTA Acute  Rehabilitation Services Office M-F          [phone]

## 2022-12-22 NOTE — Progress Notes (Signed)
SLP Cancellation Note  Patient Details Name: Scott Bass MRN: [id number] DOB: 1950/06/10   Cancelled treatment:       Reason Eval/Treat Not Completed: Patient at procedure or test/unavailable. Pt currently working with PT. Will try again another time for continued education re: MBS results and recommendations.   Laytoya Ion B. Murvin Natal, [facility], CCC-SLP Speech Language Pathologist Office: [phone]  Leigh Aurora 12/22/2022, 9:40 AM

## 2022-12-23 ENCOUNTER — Encounter: Payer: Self-pay | Admitting: Hematology

## 2022-12-23 DIAGNOSIS — N184 Chronic kidney disease, stage 4 (severe): Secondary | ICD-10-CM | POA: Diagnosis not present

## 2022-12-23 DIAGNOSIS — C9000 Multiple myeloma not having achieved remission: Secondary | ICD-10-CM | POA: Diagnosis not present

## 2022-12-23 DIAGNOSIS — N170 Acute kidney failure with tubular necrosis: Secondary | ICD-10-CM | POA: Diagnosis not present

## 2022-12-23 LAB — GLUCOSE, CAPILLARY
Glucose-Capillary: 111 mg/dL — ABNORMAL HIGH (ref 70–99)
Glucose-Capillary: 115 mg/dL — ABNORMAL HIGH (ref 70–99)
Glucose-Capillary: 117 mg/dL — ABNORMAL HIGH (ref 70–99)
Glucose-Capillary: 127 mg/dL — ABNORMAL HIGH (ref 70–99)
Glucose-Capillary: 206 mg/dL — ABNORMAL HIGH (ref 70–99)
Glucose-Capillary: 76 mg/dL (ref 70–99)

## 2022-12-23 LAB — BASIC METABOLIC PANEL
Anion gap: 9 (ref 5–15)
BUN: 27 mg/dL — ABNORMAL HIGH (ref 8–23)
CO2: 16 mmol/L — ABNORMAL LOW (ref 22–32)
Calcium: 7.2 mg/dL — ABNORMAL LOW (ref 8.9–10.3)
Chloride: 113 mmol/L — ABNORMAL HIGH (ref 98–111)
Creatinine, Ser: 1.81 mg/dL — ABNORMAL HIGH (ref 0.61–1.24)
GFR, Estimated: 39 mL/min — ABNORMAL LOW (ref >60)
Glucose, Bld: 109 mg/dL — ABNORMAL HIGH (ref 70–99)
Potassium: 2.9 mmol/L — ABNORMAL LOW (ref 3.5–5.1)
Sodium: 138 mmol/L (ref 135–145)

## 2022-12-23 LAB — CBC
HCT: 25.2 % — ABNORMAL LOW (ref 39.0–52.0)
Hemoglobin: 7.8 g/dL — ABNORMAL LOW (ref 13.0–17.0)
MCH: 29.2 pg (ref 26.0–34.0)
MCHC: 31 g/dL (ref 30.0–36.0)
MCV: 94.4 fL (ref 80.0–100.0)
Platelets: 89 10*3/uL — ABNORMAL LOW (ref 150–400)
RBC: 2.67 MIL/uL — ABNORMAL LOW (ref 4.22–5.81)
RDW: 18.3 % — ABNORMAL HIGH (ref 11.5–15.5)
WBC: 5.2 10*3/uL (ref 4.0–10.5)
nRBC: 0.8 % — ABNORMAL HIGH (ref 0.0–0.2)

## 2022-12-23 LAB — MAGNESIUM: Magnesium: 1.5 mg/dL — ABNORMAL LOW (ref 1.7–2.4)

## 2022-12-23 MED ORDER — POTASSIUM CHLORIDE 20 MEQ PO PACK
40.0000 meq | PACK | ORAL | Status: AC
  Administered 2022-12-23 (×2): 40 meq via ORAL
  Filled 2022-12-23 (×2): qty 2

## 2022-12-23 MED ORDER — SODIUM BICARBONATE 650 MG PO TABS
1300.0000 mg | ORAL_TABLET | Freq: Three times a day (TID) | ORAL | Status: DC
  Administered 2022-12-23 – 2022-12-29 (×18): 1300 mg via ORAL
  Filled 2022-12-23 (×19): qty 2

## 2022-12-23 MED ORDER — MAGNESIUM SULFATE 2 GM/50ML IV SOLN
2.0000 g | Freq: Once | INTRAVENOUS | Status: AC
  Administered 2022-12-23: 2 g via INTRAVENOUS
  Filled 2022-12-23: qty 50

## 2022-12-23 NOTE — Progress Notes (Signed)
Marland Kitchen  HEMATOLOGY/ONCOLOGY INPATIENT PROGRESS NOTE  Date of Service: 12/23/2022   Inpatient Attending: .Narda Bonds, MD   SUBJECTIVE Patient seen in medical oncology follow-up.  No acute new symptoms.  Hemoglobin drifting down gradually. Still awaiting SNF placement. Tolerated his last dose of Velcade well.  Good bowel movements yesterday.  OBJECTIVE:  NAD  PHYSICAL EXAMINATION: . Vitals:   12/23/22 0452 12/23/22 0920 12/23/22 0950 12/23/22 1103  BP: 114/62 116/67 96/64 (!) 87/57  Pulse: 99 (!) 112 (!) 104 86  Resp: 18  17 17   Temp: 98.5 F (36.9 C)  98 F (36.7 C) 97.8 F (36.6 C)  TempSrc: Oral  Oral Oral  SpO2: 96%  100% 98%  Weight:      Height:       Filed Weights   12/20/22 0500 12/21/22 0704 12/22/22 0500  Weight: 148 lb 2.4 oz (67.2 kg) 154 lb 8.7 oz (70.1 kg) 147 lb 0.8 oz (66.7 kg)   .Body mass index is 23.73 kg/m.  GENERAL:alert,bed bound OROPHARYNX: Mucous membranes moist LUNGS: clear to auscultation with normal respiratory effort HEART: regular rate & rhythm,  no murmurs and no lower extremity edema ABDOMEN: abdomen soft, non-tender, normoactive bowel sounds  PSYCH: alert and conversant  MEDICAL HISTORY:  Past Medical History:  Diagnosis Date   Arthritis    "lower back" (12/18/2017)   Better eye: moderate vision impairment; lesser eye: blind    GERD (gastroesophageal reflux disease)    Glaucoma, both eyes    High cholesterol    Hypertension    Hypothyroidism    Irregular heartbeat    Legally blind    "both eyes; can see some out of left eye"   OSA (obstructive sleep apnea)    Thyroid disease    Type II diabetes mellitus (HCC)    Vitamin B12 deficiency     SURGICAL HISTORY: Past Surgical History:  Procedure Laterality Date   LEFT HEART CATH AND CORONARY ANGIOGRAPHY N/A 12/18/2017   Procedure: LEFT HEART CATH AND CORONARY ANGIOGRAPHY;  Surgeon: Elder Negus, MD;  Location: [facility];  Service: Cardiovascular;   Laterality: N/A;   NOSE SURGERY     "was crooked; they straightened it out" (12/18/2017)    SOCIAL HISTORY: Social History   Socioeconomic History   Marital status: Married    Spouse name: Not on file   Number of children: 0   Years of education: Not on file   Highest education level: Not on file  Occupational History   Occupation: retired  Tobacco Use   Smoking status: Never   Smokeless tobacco: Never  Vaping Use   Vaping status: Never Used  Substance and Sexual Activity   Alcohol use: Not Currently    Comment: occ   Drug use: Never   Sexual activity: Not Currently  Other Topics Concern   Not on file  Social History Narrative   Not on file   Social Determinants of Health   Financial Resource Strain: Low Risk  (10/10/2022)   Received from [facility], [facility]   Overall Financial Resource Strain (CARDIA)    Difficulty of Paying Living Expenses: Not very hard  Food Insecurity: Food Insecurity Present (12/06/2022)   Hunger Vital Sign    Worried About Running Out of Food in the Last Year: Often true    Ran Out of Food in the Last Year: Often true  Transportation Needs: No Transportation Needs (12/06/2022)   PRAPARE - Administrator, Civil Service (  Medical): No    Lack of Transportation (Non-Medical): No  Physical Activity: Insufficiently Active (12/23/2020)   Received from [facility], [facility]   Exercise Vital Sign    Days of Exercise per Week: 2 days    Minutes of Exercise per Session: 10 min  Stress: No Stress Concern Present (12/23/2020)   Received from [facility], [facility] - Occupational Stress Questionnaire    Feeling of Stress : Not at all  Social Connections: Unknown (09/16/2021)   Received from [facility], [facility]   Social Network    Social Network: Not on file  Intimate Partner Violence: Patient Unable To Answer (12/06/2022)   Humiliation, Afraid, Rape, and Kick questionnaire     Fear of Current or Ex-Partner: Patient unable to answer    Emotionally Abused: Patient unable to answer    Physically Abused: Patient unable to answer    Sexually Abused: Patient unable to answer    FAMILY HISTORY: Family History  Problem Relation Age of Onset   Diabetes Mellitus II Mother 61   Diabetes Mellitus II Sister    Breast cancer Sister    Heart failure Sister 81    ALLERGIES:  is allergic to hctz [hydrochlorothiazide].  MEDICATIONS:  Scheduled Meds:  brimonidine  1 drop Both Eyes TID   feeding supplement  237 mL Oral BID BM   ferrous sulfate  325 mg Oral QODAY   folic acid  1 mg Oral Daily   heparin injection (subcutaneous)  5,000 Units Subcutaneous Q8H   insulin aspart  0-15 Units Subcutaneous Q4H   latanoprost  1 drop Both Eyes QHS   levothyroxine  150 mcg Oral Q0600   metoprolol tartrate  25 mg Oral BID   pantoprazole  40 mg Oral BID   potassium chloride  40 mEq Oral Q4H   senna-docusate  1 tablet Oral BID   sodium bicarbonate  1,300 mg Oral TID   tamsulosin  0.4 mg Oral Daily   Continuous Infusions:  magnesium sulfate bolus IVPB 2 g (12/23/22 1349)   PRN Meds:.bisacodyl, docusate sodium, haloperidol lactate, hydrALAZINE, HYDROmorphone (DILAUDID) injection, loperamide, ondansetron (ZOFRAN) IV, ondansetron, mouth rinse, phenol, polyethylene glycol, simethicone  REVIEW OF SYSTEMS:    10 Point review of Systems was done is negative except as noted above.   LABORATORY DATA:  I have reviewed the data as listed .    Latest Ref Rng & Units 12/23/2022    6:40 AM 12/22/2022    6:11 PM 12/22/2022    6:44 AM  CBC  WBC 4.0 - 10.5 K/uL 5.2   5.3   Hemoglobin 13.0 - 17.0 g/dL 7.8  9.2  8.2   Hematocrit 39.0 - 52.0 % 25.2  30.0  26.5   Platelets 150 - 400 K/uL 89   111      .    Latest Ref Rng & Units 12/23/2022    6:40 AM 12/21/2022    8:51 PM 12/20/2022    5:13 AM  CMP  Glucose 70 - 99 mg/dL 784  696  295   BUN 8 - 23 mg/dL 27  26  29    Creatinine  0.61 - 1.24 mg/dL 2.84  1.32  4.40   Sodium 135 - 145 mmol/L 138  137  144   Potassium 3.5 - 5.1 mmol/L 2.9  4.6  4.1   Chloride 98 - 111 mmol/L 113  112  118   CO2 22 - 32 mmol/L 16  18  19   Calcium 8.9 - 10.3 mg/dL 7.2  7.5  7.7   Total Protein 6.5 - 8.1 g/dL   5.3   Total Bilirubin 0.3 - 1.2 mg/dL   0.8   Alkaline Phos 38 - 126 U/L   102   AST 15 - 41 U/L   25   ALT 0 - 44 U/L   28        RADIOGRAPHIC STUDIES: I have personally reviewed the radiological images as listed and agreed with the findings in the report. DG Abd 1 View  Result Date: 12/19/2022 CLINICAL DATA:  Abdominal distention. EXAM: ABDOMEN - 1 VIEW COMPARISON:  12/18/2022 FINDINGS: Gas and stool throughout the colon. Scattered gas-filled small bowel. No small or large bowel distention. No radiopaque stones. Prominent degenerative changes in the spine and hips. Multiple lucent lesions demonstrated throughout the spine and pelvis consistent with multiple myeloma. Lung bases are clear. IMPRESSION: Nonobstructive bowel gas pattern with scattered stool filled colon. Electronically Signed   By: Burman Nieves M.D.   On: 12/19/2022 20:15   DG Abd 1 View  Result Date: 12/18/2022 CLINICAL DATA:  Abdomen discomfort EXAM: ABDOMEN - 1 VIEW COMPARISON:  02/12/2013, CT 12/04/2022 FINDINGS: Interval mild air distension of small and large bowel with moderate feces in the right colon. Numerous lucent lesions in the pelvis corresponding to history of myeloma. IMPRESSION: Interval mild air distension of small and large bowel with moderate feces in the right colon. Question mild ileus, though consider radiographic follow-up to exclude developing bowel obstruction Electronically Signed   By: Jasmine Pang M.D.   On: 12/18/2022 19:42   CT Chest Wo Contrast  Result Date: 12/04/2022 CLINICAL DATA:  Blunt chest trauma. EXAM: CT CHEST, ABDOMEN AND PELVIS WITHOUT CONTRAST TECHNIQUE: Multidetector CT imaging of the chest, abdomen and pelvis was  performed following the standard protocol without IV contrast. RADIATION DOSE REDUCTION: This exam was performed according to the departmental dose-optimization program which includes automated exposure control, adjustment of the mA and/or kV according to patient size and/or use of iterative reconstruction technique. COMPARISON:  Chest CT dated 09/26/2022. FINDINGS: CT CHEST FINDINGS Cardiovascular: No thoracic aortic aneurysm. No pericardial effusion. Mediastinum/Nodes: No mass or enlarged lymph nodes within the mediastinum. No hemorrhage or edema is seen within the mediastinum. Esophagus is unremarkable. Trachea is unremarkable. Lungs/Pleura: Mild dependent atelectasis bilaterally. Lungs appear otherwise clear. No pleural effusion or hemothorax. No pneumothorax. Musculoskeletal: Innumerable lytic lesions throughout the thoracic spine and bilateral ribs, corresponding to patient's known multiple myeloma. Associated large expansile lesion within the anterior second RIGHT rib. Multiple associated pathologic rib fractures bilaterally, of uncertain age but likely chronic. Displaced fracture of the lower RIGHT scapula, also likely pathologic related to underlying lytic lesions (multiple myeloma), of uncertain age. No acute appearing fracture or subluxation within the thoracic spine. Large expansile mass involving the T4 vertebral body with soft tissue component extending into the central canal and LEFT neural foramen, with almost certain mass effect on the thoracic cord and exiting nerve root. CT ABDOMEN PELVIS FINDINGS Hepatobiliary: No focal liver abnormality is seen. Gallbladder is unremarkable. No perihepatic hematoma. No bile duct dilatation is seen. Pancreas: Partially infiltrated with fat but otherwise unremarkable. Spleen: No splenic injury or perisplenic hematoma. Adrenals/Urinary Tract: Adrenal glands appear normal. No adrenal hemorrhage. No renal injury identified. No renal stone or hydronephrosis. No  ureteral or bladder calculi are identified. Bladder is unremarkable. Stomach/Bowel: No dilated large or small bowel loops. No evidence of bowel wall  inflammation or bowel wall injury. Appendix is normal. Stomach is unremarkable. Vascular/Lymphatic: No abdominal aortic aneurysm. No periaortic hemorrhage. No enlarged lymph nodes are seen in the abdomen or pelvis. Reproductive: Moderate prostate gland enlargement with some associated mass effect on the bladder base. Other: No free fluid or hemorrhage is seen within the abdomen or pelvis. No free intraperitoneal air. Musculoskeletal: Innumerable lytic lesions throughout the lumbar spine and osseous pelvis, corresponding to patient's known multiple myeloma. Questionable mild compression deformity of the L4 vertebral body, likely pathologic, nonacute and related to the underlying lytic changes of patient's multiple myeloma. No acute-appearing osseous abnormality is seen within the abdomen or pelvis. IMPRESSION: 1. Innumerable lytic lesions throughout the thoracic spine, bilateral ribs, lumbar spine and osseous pelvis, corresponding to patient's known multiple myeloma. 2. Large expansile mass involving the T4 vertebral body with soft tissue component extending into the central canal and LEFT neural foramen, with almost certain mass effect on the thoracic cord and exiting nerve root. Consider MRI of the thoracic spine without and with IV contrast to further evaluate. 3. Multiple associated pathologic rib fractures bilaterally, of uncertain age but likely chronic. 4. Displaced fracture of the lower RIGHT scapula, of uncertain age but also likely pathologic related to underlying lytic lesions (multiple myeloma). 5. Questionable mild compression deformity of the L4 vertebral body, likely pathologic, nonacute and related to the underlying lytic changes of patient's multiple myeloma. 6. No acute findings within the chest, abdomen or pelvis. No evidence of solid organ injury. No  hemorrhage or edema within the mediastinum. No pleural effusion or pneumothorax. No evidence of bowel wall injury. 7. Moderate prostate gland enlargement with some associated mass effect on the bladder base. Electronically Signed   By: Bary Richard M.D.   On: 12/04/2022 08:42   CT ABDOMEN PELVIS WO CONTRAST  Result Date: 12/04/2022 CLINICAL DATA:  Blunt chest trauma. EXAM: CT CHEST, ABDOMEN AND PELVIS WITHOUT CONTRAST TECHNIQUE: Multidetector CT imaging of the chest, abdomen and pelvis was performed following the standard protocol without IV contrast. RADIATION DOSE REDUCTION: This exam was performed according to the departmental dose-optimization program which includes automated exposure control, adjustment of the mA and/or kV according to patient size and/or use of iterative reconstruction technique. COMPARISON:  Chest CT dated 09/26/2022. FINDINGS: CT CHEST FINDINGS Cardiovascular: No thoracic aortic aneurysm. No pericardial effusion. Mediastinum/Nodes: No mass or enlarged lymph nodes within the mediastinum. No hemorrhage or edema is seen within the mediastinum. Esophagus is unremarkable. Trachea is unremarkable. Lungs/Pleura: Mild dependent atelectasis bilaterally. Lungs appear otherwise clear. No pleural effusion or hemothorax. No pneumothorax. Musculoskeletal: Innumerable lytic lesions throughout the thoracic spine and bilateral ribs, corresponding to patient's known multiple myeloma. Associated large expansile lesion within the anterior second RIGHT rib. Multiple associated pathologic rib fractures bilaterally, of uncertain age but likely chronic. Displaced fracture of the lower RIGHT scapula, also likely pathologic related to underlying lytic lesions (multiple myeloma), of uncertain age. No acute appearing fracture or subluxation within the thoracic spine. Large expansile mass involving the T4 vertebral body with soft tissue component extending into the central canal and LEFT neural foramen, with almost  certain mass effect on the thoracic cord and exiting nerve root. CT ABDOMEN PELVIS FINDINGS Hepatobiliary: No focal liver abnormality is seen. Gallbladder is unremarkable. No perihepatic hematoma. No bile duct dilatation is seen. Pancreas: Partially infiltrated with fat but otherwise unremarkable. Spleen: No splenic injury or perisplenic hematoma. Adrenals/Urinary Tract: Adrenal glands appear normal. No adrenal hemorrhage. No renal injury identified. No renal stone  or hydronephrosis. No ureteral or bladder calculi are identified. Bladder is unremarkable. Stomach/Bowel: No dilated large or small bowel loops. No evidence of bowel wall inflammation or bowel wall injury. Appendix is normal. Stomach is unremarkable. Vascular/Lymphatic: No abdominal aortic aneurysm. No periaortic hemorrhage. No enlarged lymph nodes are seen in the abdomen or pelvis. Reproductive: Moderate prostate gland enlargement with some associated mass effect on the bladder base. Other: No free fluid or hemorrhage is seen within the abdomen or pelvis. No free intraperitoneal air. Musculoskeletal: Innumerable lytic lesions throughout the lumbar spine and osseous pelvis, corresponding to patient's known multiple myeloma. Questionable mild compression deformity of the L4 vertebral body, likely pathologic, nonacute and related to the underlying lytic changes of patient's multiple myeloma. No acute-appearing osseous abnormality is seen within the abdomen or pelvis. IMPRESSION: 1. Innumerable lytic lesions throughout the thoracic spine, bilateral ribs, lumbar spine and osseous pelvis, corresponding to patient's known multiple myeloma. 2. Large expansile mass involving the T4 vertebral body with soft tissue component extending into the central canal and LEFT neural foramen, with almost certain mass effect on the thoracic cord and exiting nerve root. Consider MRI of the thoracic spine without and with IV contrast to further evaluate. 3. Multiple associated  pathologic rib fractures bilaterally, of uncertain age but likely chronic. 4. Displaced fracture of the lower RIGHT scapula, of uncertain age but also likely pathologic related to underlying lytic lesions (multiple myeloma). 5. Questionable mild compression deformity of the L4 vertebral body, likely pathologic, nonacute and related to the underlying lytic changes of patient's multiple myeloma. 6. No acute findings within the chest, abdomen or pelvis. No evidence of solid organ injury. No hemorrhage or edema within the mediastinum. No pleural effusion or pneumothorax. No evidence of bowel wall injury. 7. Moderate prostate gland enlargement with some associated mass effect on the bladder base. Electronically Signed   By: Bary Richard M.D.   On: 12/04/2022 08:42   CT L-SPINE NO CHARGE  Result Date: 12/04/2022 CLINICAL DATA:  Fall. EXAM: CT LUMBAR SPINE WITHOUT CONTRAST TECHNIQUE: Multidetector CT imaging of the lumbar spine was performed without intravenous contrast administration. Multiplanar CT image reconstructions were also generated. RADIATION DOSE REDUCTION: This exam was performed according to the departmental dose-optimization program which includes automated exposure control, adjustment of the mA and/or kV according to patient size and/or use of iterative reconstruction technique. COMPARISON:  Plain film of the lumbar spine dated 11/22/2015. FINDINGS: Segmentation: 5 lumbar type vertebrae. Alignment: Stable.  No evidence of acute vertebral body subluxation. Vertebrae: No fracture line or displaced fracture fragment is seen. Questionable mild compression of the L4 vertebral body which is likely nonacute. Innumerable lytic lesions throughout the lumbar spine, corresponding to patient's known multiple myeloma. The mild compression of the L4 vertebral body is likely related to the underlying lytic changes. Paraspinal and other soft tissues: Visualized immediate paravertebral soft tissues are unremarkable.  Disc levels: Disc desiccations at each level of the lumbar spine, with associated disc space narrowings and osteophyte formation, causing moderate to severe central canal stenoses at the L2-3 through L4-5 levels and mild-to-moderate central canal stenosis at L5-S1. Various degrees of neural foramen narrowing at each level of the lumbar spine as well, with possible associated nerve root impingement. IMPRESSION: 1. No acute fracture or subluxation within the lumbar spine. 2. Innumerable lytic lesions throughout the lumbar spine, corresponding to patient's known multiple myeloma. 3. Questionable interval mild compression of the L4 vertebral body which is likely nonacute and related to the underlying lytic  lesions (multiple myeloma). 4. Multilevel degenerative disc disease, causing moderate to severe central canal stenoses at the L2-3 through L4-5 levels and mild-to-moderate central canal stenosis at L5-S1, with possible associated nerve root impingement at 1 or more levels. Electronically Signed   By: Bary Richard M.D.   On: 12/04/2022 08:30   CT Head Wo Contrast  Result Date: 12/04/2022 CLINICAL DATA:  Head trauma, moderate-severe; Neck trauma (Age >= 65y) EXAM: CT HEAD WITHOUT CONTRAST CT CERVICAL SPINE WITHOUT CONTRAST TECHNIQUE: Multidetector CT imaging of the head and cervical spine was performed following the standard protocol without intravenous contrast. Multiplanar CT image reconstructions of the cervical spine were also generated. RADIATION DOSE REDUCTION: This exam was performed according to the departmental dose-optimization program which includes automated exposure control, adjustment of the mA and/or kV according to patient size and/or use of iterative reconstruction technique. COMPARISON:  None Available. FINDINGS: CT HEAD FINDINGS Brain: No hemorrhage. No extra-axial fluid collection. No hydrocephalus. No CT evidence of an acute cortical infarct. Mild chronic microvascular ischemic change.  Vascular: No hyperdense vessel or unexpected calcification. Skull: No acute fracture. There are nonspecific scattered lucent lesions throughout the calvarium, largest which is at the vertex (series 7, image 41). Sinuses/Orbits: No middle ear or mastoid effusion. Polypoid mucosal thickening in the right sphenoid left maxillary sinus. Orbits are unremarkable. Other: None. CT CERVICAL SPINE FINDINGS Alignment: Straightening of the normal cervical lordosis. Skull base and vertebrae: There is a mildly displaced fracture through the right C7 transverse process (series 5, image 30). 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 There are lytic lesions throughout the cervical spine involving every cervical vertebral body. Some of the lesions extend through the posterior cortex, for example at the right posterior C4 endplate (series 7, image 35) in the left aspect of T3 (series 3, image 2). Metastatic lesions are also seen in the bilateral scapula. Soft tissues and spinal canal: There is likely severe spinal canal stenosis at the T3 level secondary to epidural extension of tumor (series 9, image 1). Disc levels:  See above Upper chest: Negative. Other: None IMPRESSION: 1. No acute intracranial abnormality. 2. Mildly displaced fracture through the right C7 transverse process. 3. Multiple lytic lesions throughout the cervical spine, calvarium, and bilateral scapula. Findings could be seen in the setting of diffuse metastatic disease of myeloma. Some of the lesions extend through the posterior cortex, for example at the right posterior C4 endplate and left aspect of T3, with likely severe spinal canal stenosis at the T3 level secondary to epidural extension of tumor. Recommend MRI of the cervical and thoracic spine with and without contrast for further evaluation. Electronically Signed   By: Lorenza Cambridge M.D.   On: 12/04/2022 08:30   CT Cervical Spine Wo Contrast  Result Date: 12/04/2022 CLINICAL DATA:   Head trauma, moderate-severe; Neck trauma (Age >= 65y) EXAM: CT HEAD WITHOUT CONTRAST CT CERVICAL SPINE WITHOUT CONTRAST TECHNIQUE: Multidetector CT imaging of the head and cervical spine was performed following the standard protocol without intravenous contrast. Multiplanar CT image reconstructions of the cervical spine were also generated. RADIATION DOSE REDUCTION: This exam was performed according to the departmental dose-optimization program which includes automated exposure control, adjustment of the mA and/or kV according to patient size and/or use of iterative reconstruction technique. COMPARISON:  None Available. FINDINGS: CT HEAD FINDINGS Brain: No hemorrhage. No extra-axial fluid collection. No hydrocephalus. No CT evidence of an acute cortical infarct. Mild chronic microvascular ischemic change. Vascular: No hyperdense vessel or unexpected  calcification. Skull: No acute fracture. There are nonspecific scattered lucent lesions throughout the calvarium, largest which is at the vertex (series 7, image 41). Sinuses/Orbits: No middle ear or mastoid effusion. Polypoid mucosal thickening in the right sphenoid left maxillary sinus. Orbits are unremarkable. Other: None. CT CERVICAL SPINE FINDINGS Alignment: Straightening of the normal cervical lordosis. Skull base and vertebrae: There is a mildly displaced fracture through the right C7 transverse process (series 5, image 30). 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 There are lytic lesions throughout the cervical spine involving every cervical vertebral body. Some of the lesions extend through the posterior cortex, for example at the right posterior C4 endplate (series 7, image 35) in the left aspect of T3 (series 3, image 2). Metastatic lesions are also seen in the bilateral scapula. Soft tissues and spinal canal: There is likely severe spinal canal stenosis at the T3 level secondary to epidural extension of tumor (series 9, image 1).  Disc levels:  See above Upper chest: Negative. Other: None IMPRESSION: 1. No acute intracranial abnormality. 2. Mildly displaced fracture through the right C7 transverse process. 3. Multiple lytic lesions throughout the cervical spine, calvarium, and bilateral scapula. Findings could be seen in the setting of diffuse metastatic disease of myeloma. Some of the lesions extend through the posterior cortex, for example at the right posterior C4 endplate and left aspect of T3, with likely severe spinal canal stenosis at the T3 level secondary to epidural extension of tumor. Recommend MRI of the cervical and thoracic spine with and without contrast for further evaluation. Electronically Signed   By: Lorenza Cambridge M.D.   On: 12/04/2022 08:30   DG Chest Portable 1 View  Result Date: 12/04/2022 CLINICAL DATA:  Status post fall. EXAM: PORTABLE CHEST 1 VIEW COMPARISON:  PET-CT 10/06/2022 FINDINGS: Heart size and mediastinal contours are unremarkable. No pleural fluid or interstitial edema. No airspace consolidation. 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 right upper lobe lung mass is again seen measuring approximately 4.4 cm on today's study. Again seen are extensive, multifocal lytic bone metastases which involve bilateral ribs. Age indeterminate pathologic fractures involving the lateral aspect of the right fourth rib and left posterior sixth rib noted. IMPRESSION: 1. No acute cardiopulmonary disease. 2. Right upper lobe lung mass. 3. Extensive lytic bone metastases. Age indeterminate pathologic fractures involving the lateral aspect of the right fourth rib and left posterior sixth rib. Electronically Signed   By: Signa Kell M.D.   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  He had also been referred to social work at that time to help address his barriers of care. He is now admitted with progressive multiple myeloma.  Multiple myeloma, IgG kappa, diagnosed May 2024-untreated since patient did not follow-up now admitted with progressive myeloma with severe anemia and progressive renal failure. CTs 12/04/2022-innumerable lytic lesions, large expansile lesion in the anterior right second rib, multiple pathologic rib fractures, expansile T4 (T3 on CT cervical spine) mass with soft tissue component extending into the central canal and left neural foramen, C7 transverse process fracture Decadron daily x 4 starting 12/05/2022 Cycle 1 day 1 Velcade 12/07/2022 Decadron  daily x 4 starting 12/12/2022 Cycle 1 day 8 Velcade 12/14/2022 2.  Acute/chronic renal failure 3.  Severe anemia 4.  Mild thrombocytopenia 5.  Diabetes 6.  Hypertension 7.  Glaucoma 8.  OSA PLAN -labs from today noted -plz transfuse PRBC for Hgb<7.5 and would recommend transfusing to hgb of atleast 8 prior to discharge to SNF to allow for time to receive additional PRBC as outpatient. -Patient tolerated cycle 1 day 15 of Velcade without any acute issues.  Renal function stable.  -Still awaiting SNF placement. --Skilled nursing facility will need to help the patient with transportation to cancer center for continued treatments. -will setup outpatient treatment plan on discharge.  .The total time spent in the appointment was 25 minutes* .  All of the patient's questions were answered with apparent satisfaction. The patient knows to call the clinic with any problems, questions or concerns.   Wyvonnia Lora MD MS AAHIVMS [facility] Hematology/Oncology Physician [facility]  .*Total Encounter Time as defined by the Centers for Medicare and Medicaid Services includes, in addition to  the face-to-face time of a patient visit (documented in the note above) non-face-to-face time: obtaining and reviewing outside history, ordering and reviewing medications, tests or procedures, care coordination (communications with other health care professionals or caregivers) and documentation in the medical record.

## 2022-12-23 NOTE — Progress Notes (Addendum)
PROGRESS NOTE    Scott Bass  ZOX:[id number] DOB: [birth year] DOA: 12/04/2022 PCP: Tracey Harries, MD   Brief Narrative: Scott Bass is a 72 y.o. male with a history of blindness, multiple myeloma not yet on therapy, hypertension, diabetes mellitus.  Patient presented secondary to a fall and was found to have severe acute on chronic renal failure with associated anemia and hemoglobin of 3.5.  Patient required ICU admission on presentation and was started on IV fluids for hydration in addition to receiving a total of 3 units of PRBC.  Nephrology was consulted for acute kidney injury concerning for possible complication of untreated known multiple myeloma in addition to hypovolemia/dehydration.  Patient was started empirically on antibiotics for possible infection with blood cultures significant for Staphylococcus capitis and 1 out of 4 bottles consistent with likely contaminant.  Renal function continued to improve during admission.  Patient started on chemotherapy for multiple myeloma while inpatient.   Assessment and Plan:  AKI on CKD stage IV Baseline creatinine of about 1.5-1.7. Creatinine of 11.76 on admission requiring ICU admission. Patient managed on IV fluids. Nephrology consulted with recommendation to defer hemodialysis secondary to patient's poor candidacy. Creatinine improved steadily with IV fluids and has continued to improve while off of IV fluids.  Multiple myeloma Diffuse pathological fractures Medical oncology consulted and started Velcade and Decadron while inpatient.  Pancytopenia WBC and platelets stable with unstable hemoglobin.  Hyperkalemia Likely secondary to AKI. Resolved.  Hypokalemia -Potassium repletion -Check magnesium  Hyponatremia Resolved.  Hypernatremia Resolved.  Primary hypertension Patient is on losartan, amlodipine and metoprolol, listed as not taking. Amlodipine and metoprolol started during admission. Blood pressure  low. -Discontinue amlodipine -Continue metoprolol for now  Metabolic acidosis Secondary to AKI. Improved.  Acute anemia Likely secondary to multiple myeloma. Patient received a total of 4 units of PRBC via transfusion this admission. Hemoglobin trending down. -Repeat CBC in AM  Dysphagia SLP recommendations: PO Diet Recommendation: Dysphagia 2 (Finely chopped);Mildly thick liquids (Level 2, nectar thick) Liquid Administration via: Cup;Straw Medication Administration: Crushed with puree Supervision: Full assist for feeding;Full supervision/cueing for swallowing strategies Swallowing strategies  : Minimize environmental distractions;Slow rate;Small bites/sips;effortful swallow;Clear throat intermittently;Multiple dry swallows after each bite/sip Postural changes: Position pt fully upright for meals  Tachycardia Resolved.  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 Deferred at this time secondary to renal function.  Acute metabolic encephalopathy Secondary to uremia. Improved.  Constipation -Continue MiraLAX and Senokot-S  Hypothyroidism -Continue Synthroid  Glaucoma -Continue Xalatan and Alphagan  Folic acid deficiency -Continue folic acid  Moderate malnutrition -Continue protein supplementation  Hypoalbuminemia Noted. Related to poor nutrition.   DVT prophylaxis: Heparin Code Status:   Code Status: DNR Family Communication: None at bedside Disposition Plan: Discharge to SNF likely in 24 hours if hemoglobin remains stable   Consultants:  Palliative care Psychiatry Nephrology PCCM  Procedures:    Antimicrobials:     Subjective: No concerns this morning.  Objective: BP (!) 87/57 (BP Location: Right Arm)   Pulse 86   Temp 97.8 F (36.6 C) (Oral)   Resp 17   Ht 5\' 6"  (1.676 m)   Wt 66.7 kg   SpO2 98%   BMI 23.73 kg/m   Examination:  General exam: Appears calm and  comfortable Respiratory system: Clear to auscultation. Respiratory effort normal. Gastrointestinal system: Abdomen is non-distended Central nervous system: Alert and oriented Psychiatry: Judgement and insight appear normal. Mood & affect appropriate.  Data Reviewed: I have personally reviewed following labs and imaging studies  CBC Lab Results  Component Value Date   WBC 5.2 12/23/2022   RBC 2.67 (L) 12/23/2022   HGB 7.8 (L) 12/23/2022   HCT 25.2 (L) 12/23/2022   MCV 94.4 12/23/2022   MCH 29.2 12/23/2022   PLT 89 (L) 12/23/2022   MCHC 31.0 12/23/2022   RDW 18.3 (H) 12/23/2022   LYMPHSABS 0.7 12/20/2022   MONOABS 0.5 12/20/2022   EOSABS 0.1 12/20/2022   BASOSABS 0.0 12/20/2022     Last metabolic panel Lab Results  Component Value Date   NA 138 12/23/2022   K 2.9 (L) 12/23/2022   CL 113 (H) 12/23/2022   CO2 16 (L) 12/23/2022   BUN 27 (H) 12/23/2022   CREATININE 1.81 (H) 12/23/2022   GLUCOSE 109 (H) 12/23/2022   GFRNONAA 39 (L) 12/23/2022   GFRAA >60 05/19/2015   CALCIUM 7.2 (L) 12/23/2022   PHOS 2.2 (L) 12/20/2022   PROT 5.3 (L) 12/20/2022   ALBUMIN 1.9 (L) 12/20/2022   LABGLOB 6.8 (H) 10/05/2022   AGRATIO 0.6 (L) 09/25/2022   BILITOT 0.8 12/20/2022   ALKPHOS 102 12/20/2022   AST 25 12/20/2022   ALT 28 12/20/2022   ANIONGAP 9 12/23/2022    GFR: Estimated Creatinine Clearance: 33.8 mL/min (A) (by C-G formula based on SCr of 1.81 mg/dL (H)).  No results found for this or any previous visit (from the past 240 hour(s)).    Radiology Studies: No results found.    LOS: 19 days    Jacquelin Hawking, MD [facility] 12/23/2022, 12:17 PM   If 7PM-7AM, please contact night-coverage [email or web address]

## 2022-12-23 NOTE — Plan of Care (Signed)

## 2022-12-23 NOTE — Plan of Care (Signed)
  Problem: Health Behavior/Discharge Planning: Goal: Ability to manage health-related needs will improve Outcome: Progressing   Problem: Nutrition: Goal: Adequate nutrition will be maintained Outcome: Progressing   Problem: Activity: Goal: Risk for activity intolerance will decrease Outcome: Progressing   Problem: Safety: Goal: Ability to remain free from injury will improve Outcome: Progressing   Problem: Metabolic: Goal: Ability to maintain appropriate glucose levels will improve Outcome: Progressing

## 2022-12-24 DIAGNOSIS — N184 Chronic kidney disease, stage 4 (severe): Secondary | ICD-10-CM | POA: Diagnosis not present

## 2022-12-24 DIAGNOSIS — N170 Acute kidney failure with tubular necrosis: Secondary | ICD-10-CM | POA: Diagnosis not present

## 2022-12-24 LAB — BASIC METABOLIC PANEL
Anion gap: 10 (ref 5–15)
BUN: 26 mg/dL — ABNORMAL HIGH (ref 8–23)
CO2: 17 mmol/L — ABNORMAL LOW (ref 22–32)
Calcium: 7.6 mg/dL — ABNORMAL LOW (ref 8.9–10.3)
Chloride: 113 mmol/L — ABNORMAL HIGH (ref 98–111)
Creatinine, Ser: 1.95 mg/dL — ABNORMAL HIGH (ref 0.61–1.24)
GFR, Estimated: 36 mL/min — ABNORMAL LOW (ref >60)
Glucose, Bld: 111 mg/dL — ABNORMAL HIGH (ref 70–99)
Potassium: 4.4 mmol/L (ref 3.5–5.1)
Sodium: 140 mmol/L (ref 135–145)

## 2022-12-24 LAB — CBC
HCT: 26.1 % — ABNORMAL LOW (ref 39.0–52.0)
Hemoglobin: 8.2 g/dL — ABNORMAL LOW (ref 13.0–17.0)
MCH: 29.8 pg (ref 26.0–34.0)
MCHC: 31.4 g/dL (ref 30.0–36.0)
MCV: 94.9 fL (ref 80.0–100.0)
Platelets: 79 10*3/uL — ABNORMAL LOW (ref 150–400)
RBC: 2.75 MIL/uL — ABNORMAL LOW (ref 4.22–5.81)
RDW: 18.7 % — ABNORMAL HIGH (ref 11.5–15.5)
WBC: 6.2 10*3/uL (ref 4.0–10.5)
nRBC: 1 % — ABNORMAL HIGH (ref 0.0–0.2)

## 2022-12-24 LAB — GLUCOSE, CAPILLARY
Glucose-Capillary: 104 mg/dL — ABNORMAL HIGH (ref 70–99)
Glucose-Capillary: 119 mg/dL — ABNORMAL HIGH (ref 70–99)
Glucose-Capillary: 119 mg/dL — ABNORMAL HIGH (ref 70–99)
Glucose-Capillary: 125 mg/dL — ABNORMAL HIGH (ref 70–99)
Glucose-Capillary: 130 mg/dL — ABNORMAL HIGH (ref 70–99)
Glucose-Capillary: 96 mg/dL (ref 70–99)

## 2022-12-24 MED ORDER — PHENOL 1.4 % MT LIQD: 1.0000 | OROMUCOSAL | Status: DC | PRN

## 2022-12-24 MED ORDER — GUAIFENESIN-DM 100-10 MG/5ML PO SYRP
5.0000 mL | ORAL_SOLUTION | ORAL | Status: DC | PRN
  Administered 2022-12-24 – 2022-12-27 (×5): 5 mL via ORAL
  Filled 2022-12-24 (×5): qty 5

## 2022-12-24 MED ORDER — SODIUM CHLORIDE 0.9 % IV BOLUS
1000.0000 mL | Freq: Once | INTRAVENOUS | Status: AC
  Administered 2022-12-24: 1000 mL via INTRAVENOUS

## 2022-12-24 NOTE — Progress Notes (Signed)
PROGRESS NOTE    Scott Bass  UUV:[id number] DOB: 1951-03-04 DOA: 12/04/2022 PCP: Tracey Harries, MD   Brief Narrative: Scott Bass is a 72 y.o. male with a history of blindness, multiple myeloma not yet on therapy, hypertension, diabetes mellitus.  Patient presented secondary to a fall and was found to have severe acute on chronic renal failure with associated anemia and hemoglobin of 3.5.  Patient required ICU admission on presentation and was started on IV fluids for hydration in addition to receiving a total of 3 units of PRBC.  Nephrology was consulted for acute kidney injury concerning for possible complication of untreated known multiple myeloma in addition to hypovolemia/dehydration.  Patient was started empirically on antibiotics for possible infection with blood cultures significant for Staphylococcus capitis and 1 out of 4 bottles consistent with likely contaminant.  Renal function continued to improve during admission.  Patient started on chemotherapy for multiple myeloma while inpatient.   Assessment and Plan:  AKI on CKD stage IV Baseline creatinine of about 1.5-1.7. Creatinine of 11.76 on admission requiring ICU admission. Patient managed on IV fluids. Nephrology consulted with recommendation to defer hemodialysis secondary to patient's poor candidacy. Creatinine improved steadily with IV fluids and has continued to improve while off of IV fluids. -Will give a bolus of IV fluids today for slightly elevated creatinine.  Multiple myeloma Diffuse pathological fractures Medical oncology consulted and started Velcade and Decadron while inpatient.  Pancytopenia WBC and platelets stable with unstable hemoglobin.  Hyperkalemia Likely secondary to AKI. Resolved.  Hypokalemia Resolved with potassium supplementation.  Hypomagnesemia Supplementation given.  Hyponatremia Resolved.  Hypernatremia Resolved.  Primary hypertension Patient is on losartan, amlodipine and  metoprolol, listed as not taking. Amlodipine and metoprolol started during admission. Blood pressure low on amlodipine - now improved.  Metabolic acidosis Secondary to AKI. Improved.  Acute anemia Likely secondary to multiple myeloma. Patient received a total of 4 units of PRBC via transfusion this admission. Hemoglobin stable.  Dysphagia SLP recommendations: PO Diet Recommendation: Dysphagia 2 (Finely chopped);Mildly thick liquids (Level 2, nectar thick) Liquid Administration via: Cup;Straw Medication Administration: Crushed with puree Supervision: Full assist for feeding;Full supervision/cueing for swallowing strategies Swallowing strategies  : Minimize environmental distractions;Slow rate;Small bites/sips;effortful swallow;Clear throat intermittently;Multiple dry swallows after each bite/sip Postural changes: Position pt fully upright for meals  Tachycardia Resolved.  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 Deferred at this time secondary to renal function.  Acute metabolic encephalopathy Secondary to uremia. Improved.  Constipation Resolved.  Hypothyroidism -Continue Synthroid  Glaucoma -Continue Xalatan and Alphagan  Folic acid deficiency -Continue folic acid  Moderate malnutrition -Continue protein supplementation  Hypoalbuminemia Noted. Related to poor nutrition.   DVT prophylaxis: Heparin Code Status:   Code Status: DNR Family Communication: None at bedside Disposition Plan: Discharge to SNF likely in 24 hours if hemoglobin remains stable and no further emesis.   Consultants:  Palliative care Psychiatry Nephrology PCCM  Procedures:    Antimicrobials:     Subjective: Emesis this morning.   Objective: BP (!) 103/57 (BP Location: Left Arm)   Pulse (!) 104   Temp 97.8 F (36.6 C) (Oral)   Resp 20   Ht 5\' 6"  (1.676 m)   Wt 69.3 kg   SpO2 95%   BMI 24.66 kg/m    Examination:  General exam: Appears calm and comfortable Respiratory system: Clear to auscultation. Respiratory effort normal. Cardiovascular system: S1 & S2 heard, RRR. Gastrointestinal system: Abdomen  is nondistended, soft and nontender. Normal bowel sounds heard. Central nervous system: Alert and oriented.   Data Reviewed: I have personally reviewed following labs and imaging studies  CBC Lab Results  Component Value Date   WBC 6.2 12/24/2022   RBC 2.75 (L) 12/24/2022   HGB 8.2 (L) 12/24/2022   HCT 26.1 (L) 12/24/2022   MCV 94.9 12/24/2022   MCH 29.8 12/24/2022   PLT 79 (L) 12/24/2022   MCHC 31.4 12/24/2022   RDW 18.7 (H) 12/24/2022   LYMPHSABS 0.7 12/20/2022   MONOABS 0.5 12/20/2022   EOSABS 0.1 12/20/2022   BASOSABS 0.0 12/20/2022     Last metabolic panel Lab Results  Component Value Date   NA 140 12/24/2022   K 4.4 12/24/2022   CL 113 (H) 12/24/2022   CO2 17 (L) 12/24/2022   BUN 26 (H) 12/24/2022   CREATININE 1.95 (H) 12/24/2022   GLUCOSE 111 (H) 12/24/2022   GFRNONAA 36 (L) 12/24/2022   GFRAA >60 05/19/2015   CALCIUM 7.6 (L) 12/24/2022   PHOS 2.2 (L) 12/20/2022   PROT 5.3 (L) 12/20/2022   ALBUMIN 1.9 (L) 12/20/2022   LABGLOB 6.8 (H) 10/05/2022   AGRATIO 0.6 (L) 09/25/2022   BILITOT 0.8 12/20/2022   ALKPHOS 102 12/20/2022   AST 25 12/20/2022   ALT 28 12/20/2022   ANIONGAP 10 12/24/2022    GFR: Estimated Creatinine Clearance: 31.4 mL/min (A) (by C-G formula based on SCr of 1.95 mg/dL (H)).  No results found for this or any previous visit (from the past 240 hour(s)).    Radiology Studies: No results found.    LOS: 20 days    Jacquelin Hawking, MD [facility] 12/24/2022, 2:56 PM   If 7PM-7AM, please contact night-coverage [email or web address]

## 2022-12-24 NOTE — Progress Notes (Signed)
Physical Therapy Treatment Patient Details Name: Scott Bass MRN: [id number] DOB: 12-20-1950 Today's Date: 12/24/2022   History of Present Illness 72 year old male presented for fall and found confused, tachycardic and constipated with labs significant for AKI with hyperkalemia and severe metabolic acidosis and Hg 3.5. LA 6.0;  CTs 12/04/2022-innumerable lytic lesions, large expansile lesion in the anterior right second rib, multiple pathologic rib fractures, expansile T4 (T3 on CT cervical spine) mass with soft tissue component extending into the central canal and left neural foramen, C7 transverse process fracture, displaced fracture of the lower RIGHT scapula, of uncertain age. PHMx: blindness (can see some, but not details), recently diagnosed multiple myeloma in May 2024 but has not started therapy (diffuse lesions) , HTN, DM    PT Comments  Pt politely declines OOB/amb; RN confirms pt was up to chair earlier today; Scott Bass was agreeable to exercises in bed which he tol well. Good pt effort; d/c plan remains appropriate.   Patient will benefit from continued  follow up therapy, <3 hours/day at d/c    If plan is discharge home, recommend the following: A little help with walking and/or transfers;A little help with bathing/dressing/bathroom;Assistance with cooking/housework;Assist for transportation;Help with stairs or ramp for entrance   Can travel by private vehicle     No  Equipment Recommendations  None recommended by PT    Recommendations for Other Services       Precautions / Restrictions Precautions Precautions: Fall     Mobility  Bed Mobility               General bed mobility comments: declined OOB, c/o being cold    Transfers                        Ambulation/Gait                   Stairs             Wheelchair Mobility     Tilt Bed    Modified Rankin (Stroke Patients Only)       Balance                                             Cognition Arousal: Alert Behavior During Therapy: WFL for tasks assessed/performed Overall Cognitive Status: Within Functional Limits for tasks assessed                                          Exercises General Exercises - Lower Extremity Ankle Circles/Pumps: AROM, 10 reps, Both Quad Sets: AROM, Both, 10 reps, Strengthening Heel Slides: AROM, Both, 10 reps Hip ABduction/ADduction: AROM, Both, Strengthening, 10 reps    General Comments        Pertinent Vitals/Pain Pain Assessment Pain Assessment: No/denies pain    Home Living                          Prior Function            PT Goals (current goals can now be found in the care plan section) Acute Rehab PT Goals Patient Stated Goal: go home PT Goal Formulation: With patient/family Time For Goal Achievement: 12/22/22 Potential to Achieve Goals:  Good Progress towards PT goals: Progressing toward goals    Frequency    Min 1X/week      PT Plan      Co-evaluation              AM-PAC PT "6 Clicks" Mobility   Outcome Measure  Help needed turning from your back to your side while in a flat bed without using bedrails?: A Little Help needed moving from lying on your back to sitting on the side of a flat bed without using bedrails?: A Little Help needed moving to and from a bed to a chair (including a wheelchair)?: A Little Help needed standing up from a chair using your arms (e.g., wheelchair or bedside chair)?: A Little Help needed to walk in hospital room?: A Lot Help needed climbing 3-5 steps with a railing? : A Lot 6 Click Score: 16    End of Session Equipment Utilized During Treatment: Gait belt Activity Tolerance: Patient tolerated treatment well Patient left: with call bell/phone within reach;in bed;with bed alarm set   PT Visit Diagnosis: Unsteadiness on feet (R26.81);Other abnormalities of gait and mobility (R26.89);Difficulty in walking,  not elsewhere classified (R26.2)     Time: 6962-9528 PT Time Calculation (min) (ACUTE ONLY): 10 min  Charges:    $Therapeutic Exercise: 8-22 mins PT General Charges $$ ACUTE PT VISIT: 1 Visit                     Scott Bass, PT  Acute Rehab Dept [facility]) [phone]  12/24/2022    [facility] 12/24/2022, 3:10 PM

## 2022-12-24 NOTE — Plan of Care (Signed)
  Problem: Education: Goal: Knowledge of General Education information will improve Description: Including pain rating scale, medication(s)/side effects and non-pharmacologic comfort measures Outcome: Progressing   Problem: Activity: Goal: Risk for activity intolerance will decrease Outcome: Progressing   

## 2022-12-24 NOTE — Plan of Care (Signed)
  Problem: Education: Goal: Knowledge of General Education information will improve Description: Including pain rating scale, medication(s)/side effects and non-pharmacologic comfort measures Outcome: Not Progressing   Problem: Health Behavior/Discharge Planning: Goal: Ability to manage health-related needs will improve Outcome: Not Progressing   Problem: Clinical Measurements: Goal: Ability to maintain clinical measurements within normal limits will improve Outcome: Not Progressing Goal: Will remain free from infection Outcome: Not Progressing Goal: Diagnostic test results will improve Outcome: Not Progressing Goal: Respiratory complications will improve Outcome: Not Progressing Goal: Cardiovascular complication will be avoided Outcome: Not Progressing   Problem: Activity: Goal: Risk for activity intolerance will decrease Outcome: Not Progressing   Problem: Nutrition: Goal: Adequate nutrition will be maintained Outcome: Not Progressing   Problem: Coping: Goal: Level of anxiety will decrease Outcome: Not Progressing   Problem: Pain Managment: Goal: General experience of comfort will improve Outcome: Not Progressing   Problem: Safety: Goal: Ability to remain free from injury will improve Outcome: Not Progressing

## 2022-12-25 DIAGNOSIS — N184 Chronic kidney disease, stage 4 (severe): Secondary | ICD-10-CM | POA: Diagnosis not present

## 2022-12-25 DIAGNOSIS — N170 Acute kidney failure with tubular necrosis: Secondary | ICD-10-CM | POA: Diagnosis not present

## 2022-12-25 LAB — CBC
HCT: 23.3 % — ABNORMAL LOW (ref 39.0–52.0)
Hemoglobin: 8 g/dL — ABNORMAL LOW (ref 13.0–17.0)
MCH: 30.4 pg (ref 26.0–34.0)
MCHC: 34.3 g/dL (ref 30.0–36.0)
MCV: 88.6 fL (ref 80.0–100.0)
Platelets: 63 10*3/uL — ABNORMAL LOW (ref 150–400)
RBC: 2.63 MIL/uL — ABNORMAL LOW (ref 4.22–5.81)
RDW: 18.2 % — ABNORMAL HIGH (ref 11.5–15.5)
WBC: 6.7 10*3/uL (ref 4.0–10.5)
nRBC: 0.4 % — ABNORMAL HIGH (ref 0.0–0.2)

## 2022-12-25 LAB — BASIC METABOLIC PANEL
Anion gap: 7 (ref 5–15)
BUN: 29 mg/dL — ABNORMAL HIGH (ref 8–23)
CO2: 20 mmol/L — ABNORMAL LOW (ref 22–32)
Calcium: 7.5 mg/dL — ABNORMAL LOW (ref 8.9–10.3)
Chloride: 115 mmol/L — ABNORMAL HIGH (ref 98–111)
Creatinine, Ser: 2.11 mg/dL — ABNORMAL HIGH (ref 0.61–1.24)
GFR, Estimated: 33 mL/min — ABNORMAL LOW (ref >60)
Glucose, Bld: 92 mg/dL (ref 70–99)
Potassium: 3.5 mmol/L (ref 3.5–5.1)
Sodium: 142 mmol/L (ref 135–145)

## 2022-12-25 LAB — GLUCOSE, CAPILLARY
Glucose-Capillary: 83 mg/dL (ref 70–99)
Glucose-Capillary: 108 mg/dL — ABNORMAL HIGH (ref 70–99)
Glucose-Capillary: 119 mg/dL — ABNORMAL HIGH (ref 70–99)
Glucose-Capillary: 140 mg/dL — ABNORMAL HIGH (ref 70–99)
Glucose-Capillary: 119 mg/dL — ABNORMAL HIGH (ref 70–99)

## 2022-12-25 LAB — MAGNESIUM: Magnesium: 1.8 mg/dL (ref 1.7–2.4)

## 2022-12-25 MED ORDER — SODIUM CHLORIDE 0.45 % IV SOLN: INTRAVENOUS | Status: DC

## 2022-12-25 MED ORDER — SODIUM CHLORIDE 0.45 % IV SOLN: INTRAVENOUS | Status: AC

## 2022-12-25 MED ORDER — ENSURE ENLIVE PO LIQD
237.0000 mL | Freq: Three times a day (TID) | ORAL | Status: DC
  Administered 2022-12-25 – 2022-12-29 (×7): 237 mL via ORAL

## 2022-12-25 NOTE — TOC Progression Note (Signed)
Transition of Care [facility]) - Progression Note    Patient Details  Name: Scott Bass MRN: [id number] Date of Birth: [birth year]  [facility] CM/SW Contact  Otelia Santee, LCSW Phone Number: 12/25/2022, 1:01 PM  Clinical Narrative:    Insurance auth expired. New authorization has been requested. Currently pending.    Expected Discharge Plan: IP Rehab Facility Barriers to Discharge: Continued Medical Work up  Expected Discharge Plan and Services In-house Referral: Clinical Social Work     Living arrangements for the past 2 months: Single Family Home                                       Social Determinants of Health (SDOH) Interventions SDOH Screenings   Food Insecurity: Food Insecurity Present (12/06/2022)  Housing: Low Risk  (12/06/2022)  Transportation Needs: No Transportation Needs (12/06/2022)  Utilities: Not At Risk (12/24/2022)  Financial Resource Strain: Low Risk  (10/10/2022)   Received from [facility], [facility]  Physical Activity: Insufficiently Active (12/23/2020)   Received from [facility], [facility]  Social Connections: Unknown (09/16/2021)   Received from [facility], [facility]  Stress: No Stress Concern Present (12/23/2020)   Received from [facility], [facility]  Tobacco Use: Low Risk  (12/06/2022)    Readmission Risk Interventions    12/19/2022   11:42 AM 12/07/2022   11:22 AM  Readmission Risk Prevention Plan  Transportation Screening Complete   Medication Review (RN Care Manager) Complete   PCP or Specialist appointment within 3-5 days of discharge Complete   HRI or Home Care Consult Complete Complete  SW Recovery Care/Counseling Consult Complete Complete  Palliative Care Screening Not Applicable   Skilled Nursing Facility Complete

## 2022-12-25 NOTE — Progress Notes (Signed)
Nutrition Follow-up  DOCUMENTATION CODES:   Non-severe (moderate) malnutrition in context of chronic illness  INTERVENTION:   -Increase Ensure Plus High Protein po TID, each supplement provides 350 kcal and 20 grams of protein.   - Magic cup BID with meals, each supplement provides 290 kcal and 9 grams of protein - Mighty Shake BID with meals, each supplement provides 330 kcals and 9 grams of protein - Encourage intake at all meals and of supplements as tolerated.  NUTRITION DIAGNOSIS:   Moderate Malnutrition related to chronic illness as evidenced by mild fat depletion, mild muscle depletion, percent weight loss (28% in 3 months).  Ongoing.  GOAL:   Patient will meet greater than or equal to 90% of their needs  Not met.  MONITOR:   PO intake, Supplement acceptance, Diet advancement, Weight trends  ASSESSMENT:   72 y.o. wheelchair-bound male with PMH significant for blindness, recently diagnosed multiple myeloma in May 2024 but has not started therapy yet, HTN, diabetes who presented s/p fall and found to be confused, tachycardic and constipated with labs significant for AKI with hyperkalemia and severe metabolic acidosis.  Admitted for acute on chronic renal failure.  Patient currently refusing meals. Drinking Ensure most of the time. Will increase order to TID.   Admission weight: 152 lbs Current weight: 155 lbs  Medications: Folic acid, Ferrous sulfate, Senokot  Labs reviewed:  CBGs: 83-130  Diet Order:   Diet Order             DIET DYS 2 Room service appropriate? Yes; Fluid consistency: Nectar Thick  Diet effective now                   EDUCATION NEEDS:   Education needs have been addressed  Skin:  Skin Assessment: Reviewed RN Assessment  Last BM:  8/18  Height:   Ht Readings from Last 1 Encounters:  12/04/22 5\' 6"  (1.676 m)    Weight:   Wt Readings from Last 1 Encounters:  12/25/22 70.5 kg    BMI:  Body mass index is 25.09  kg/m.  Estimated Nutritional Needs:   Kcal:  1950-2100 kcals  Protein:  70-85 grams  Fluid:  >/= 1.9L   Tilda Franco, MS, RD, LDN Inpatient Clinical Dietitian Contact information available via Amion

## 2022-12-25 NOTE — Progress Notes (Signed)
Mobility Specialist - Progress Note   12/25/22 0855  Mobility  Activity Ambulated with assistance in hallway  Level of Assistance Standby assist, set-up cues, supervision of patient - no hands on  Assistive Device Front wheel walker  Distance Ambulated (ft) 55 ft  Range of Motion/Exercises Active  Activity Response Tolerated well  Mobility Referral Yes  $Mobility charge 1 Mobility  Mobility Specialist Start Time (ACUTE ONLY) 0830  Mobility Specialist Stop Time (ACUTE ONLY) 0840  Mobility Specialist Time Calculation (min) (ACUTE ONLY) 10 min   Pt received in bed and agreed to mobility. Min A for sts, standby for ambulation. Had no issues throughout session, returned to bed with all needs met, alarm on.  Marilynne Halsted Mobility Specialist

## 2022-12-25 NOTE — Plan of Care (Signed)
  Problem: Clinical Measurements: Goal: Ability to maintain clinical measurements within normal limits will improve Outcome: Progressing Goal: Will remain free from infection Outcome: Progressing Goal: Diagnostic test results will improve Outcome: Progressing Goal: Respiratory complications will improve Outcome: Progressing Goal: Cardiovascular complication will be avoided Outcome: Progressing   Problem: Nutrition: Goal: Adequate nutrition will be maintained Outcome: Progressing   Problem: Coping: Goal: Level of anxiety will decrease Outcome: Progressing   Problem: Elimination: Goal: Will not experience complications related to bowel motility Outcome: Progressing Goal: Will not experience complications related to urinary retention Outcome: Progressing   Problem: Pain Managment: Goal: General experience of comfort will improve Outcome: Progressing   Problem: Safety: Goal: Ability to remain free from injury will improve Outcome: Progressing   Problem: Education: Goal: Individualized Educational Video(s) Outcome: Progressing   Problem: Fluid Volume: Goal: Ability to maintain a balanced intake and output will improve Outcome: Progressing   Problem: Metabolic: Goal: Ability to maintain appropriate glucose levels will improve Outcome: Progressing   Problem: Nutritional: Goal: Maintenance of adequate nutrition will improve Outcome: Progressing Goal: Progress toward achieving an optimal weight will improve Outcome: Progressing   Problem: Tissue Perfusion: Goal: Adequacy of tissue perfusion will improve Outcome: Progressing

## 2022-12-25 NOTE — Progress Notes (Signed)
PROGRESS NOTE    Scott Bass  YQM:[id number] DOB: 1950-07-24 DOA: 12/04/2022 PCP: Tracey Harries, MD   Brief Narrative: Scott Bass is a 72 y.o. male with a history of blindness, multiple myeloma not yet on therapy, hypertension, diabetes mellitus.  Patient presented secondary to a fall and was found to have severe acute on chronic renal failure with associated anemia and hemoglobin of 3.5.  Patient required ICU admission on presentation and was started on IV fluids for hydration in addition to receiving a total of 3 units of PRBC.  Nephrology was consulted for acute kidney injury concerning for possible complication of untreated known multiple myeloma in addition to hypovolemia/dehydration.  Patient was started empirically on antibiotics for possible infection with blood cultures significant for Staphylococcus capitis and 1 out of 4 bottles consistent with likely contaminant.  Renal function continued to improve during admission.  Patient started on chemotherapy for multiple myeloma while inpatient.   Assessment and Plan:  AKI on CKD stage IV Baseline creatinine of about 1.5-1.7. Creatinine of 11.76 on admission requiring ICU admission. Patient managed on IV fluids. Nephrology consulted with recommendation to defer hemodialysis secondary to patient's poor candidacy. Creatinine improved steadily with IV fluids and has continued to improve while off of IV fluids, now slightly elevated secondary to poor oral intake. -1/2 NS IV fluids  Multiple myeloma Diffuse pathological fractures Medical oncology consulted and started Velcade and Decadron while inpatient.  Pancytopenia WBC and platelets stable with unstable hemoglobin.  Hyperkalemia Likely secondary to AKI. Resolved.  Hypokalemia Resolved with potassium supplementation.  Hypomagnesemia Supplementation given.  Hyponatremia Resolved.  Hypernatremia Resolved.  Primary hypertension Patient is on losartan, amlodipine and  metoprolol, listed as not taking. Amlodipine and metoprolol started during admission. Blood pressure low on amlodipine - now improved.  Metabolic acidosis Secondary to AKI. Improved.  Acute anemia Likely secondary to multiple myeloma. Patient received a total of 4 units of PRBC via transfusion this admission. Hemoglobin stable.  Dysphagia SLP recommendations: PO Diet Recommendation: Dysphagia 2 (Finely chopped);Mildly thick liquids (Level 2, nectar thick) Liquid Administration via: Cup;Straw Medication Administration: Crushed with puree Supervision: Full assist for feeding;Full supervision/cueing for swallowing strategies Swallowing strategies  : Minimize environmental distractions;Slow rate;Small bites/sips;effortful swallow;Clear throat intermittently;Multiple dry swallows after each bite/sip Postural changes: Position pt fully upright for meals  Tachycardia Resolved.  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 Deferred at this time secondary to renal function.  Acute metabolic encephalopathy Secondary to uremia. Improved.  Constipation Resolved.  Hypothyroidism -Continue Synthroid  Glaucoma -Continue Xalatan and Alphagan  Folic acid deficiency -Continue folic acid  Moderate malnutrition -Continue protein supplementation  Hypoalbuminemia Noted. Related to poor nutrition.   DVT prophylaxis: Heparin Code Status:   Code Status: DNR Family Communication: None at bedside Disposition Plan: Discharge to SNF when bed is available   Consultants:  Palliative care Psychiatry Nephrology PCCM  Procedures:    Antimicrobials:     Subjective: No issues from overnight.  Objective: BP 111/64   Pulse 97   Temp 98.7 F (37.1 C)   Resp 18   Ht 5\' 6"  (1.676 m)   Wt 70.5 kg   SpO2 96%   BMI 25.09 kg/m   Examination:  General exam: Appears calm and comfortable Respiratory system: Respiratory effort  normal.   Data Reviewed: I have personally reviewed following labs and imaging studies  CBC Lab Results  Component Value Date   WBC 6.7 12/25/2022  RBC 2.63 (L) 12/25/2022   HGB 8.0 (L) 12/25/2022   HCT 23.3 (L) 12/25/2022   MCV 88.6 12/25/2022   MCH 30.4 12/25/2022   PLT 63 (L) 12/25/2022   MCHC 34.3 12/25/2022   RDW 18.2 (H) 12/25/2022   LYMPHSABS 0.7 12/20/2022   MONOABS 0.5 12/20/2022   EOSABS 0.1 12/20/2022   BASOSABS 0.0 12/20/2022     Last metabolic panel Lab Results  Component Value Date   NA 142 12/25/2022   K 3.5 12/25/2022   CL 115 (H) 12/25/2022   CO2 20 (L) 12/25/2022   BUN 29 (H) 12/25/2022   CREATININE 2.11 (H) 12/25/2022   GLUCOSE 92 12/25/2022   GFRNONAA 33 (L) 12/25/2022   GFRAA >60 05/19/2015   CALCIUM 7.5 (L) 12/25/2022   PHOS 2.2 (L) 12/20/2022   PROT 5.3 (L) 12/20/2022   ALBUMIN 1.9 (L) 12/20/2022   LABGLOB 6.8 (H) 10/05/2022   AGRATIO 0.6 (L) 09/25/2022   BILITOT 0.8 12/20/2022   ALKPHOS 102 12/20/2022   AST 25 12/20/2022   ALT 28 12/20/2022   ANIONGAP 7 12/25/2022    GFR: Estimated Creatinine Clearance: 29 mL/min (A) (by C-G formula based on SCr of 2.11 mg/dL (H)).  No results found for this or any previous visit (from the past 240 hour(s)).    Radiology Studies: No results found.    LOS: 21 days    Jacquelin Hawking, MD [facility] 12/25/2022, 2:14 PM   If 7PM-7AM, please contact night-coverage [email or web address]

## 2022-12-26 ENCOUNTER — Inpatient Hospital Stay (HOSPITAL_COMMUNITY): Payer: Medicare PPO

## 2022-12-26 DIAGNOSIS — N184 Chronic kidney disease, stage 4 (severe): Secondary | ICD-10-CM | POA: Diagnosis not present

## 2022-12-26 DIAGNOSIS — N170 Acute kidney failure with tubular necrosis: Secondary | ICD-10-CM | POA: Diagnosis not present

## 2022-12-26 LAB — GLUCOSE, CAPILLARY
Glucose-Capillary: 106 mg/dL — ABNORMAL HIGH (ref 70–99)
Glucose-Capillary: 111 mg/dL — ABNORMAL HIGH (ref 70–99)
Glucose-Capillary: 112 mg/dL — ABNORMAL HIGH (ref 70–99)
Glucose-Capillary: 113 mg/dL — ABNORMAL HIGH (ref 70–99)
Glucose-Capillary: 123 mg/dL — ABNORMAL HIGH (ref 70–99)
Glucose-Capillary: 98 mg/dL (ref 70–99)

## 2022-12-26 LAB — CBC
HCT: 23.1 % — ABNORMAL LOW (ref 39.0–52.0)
Hemoglobin: 7.4 g/dL — ABNORMAL LOW (ref 13.0–17.0)
MCH: 29.6 pg (ref 26.0–34.0)
MCHC: 32 g/dL (ref 30.0–36.0)
MCV: 92.4 fL (ref 80.0–100.0)
Platelets: 66 10*3/uL — ABNORMAL LOW (ref 150–400)
RBC: 2.5 MIL/uL — ABNORMAL LOW (ref 4.22–5.81)
RDW: 19 % — ABNORMAL HIGH (ref 11.5–15.5)
WBC: 5.9 10*3/uL (ref 4.0–10.5)
nRBC: 0 % (ref 0.0–0.2)

## 2022-12-26 LAB — BASIC METABOLIC PANEL
Anion gap: 9 (ref 5–15)
BUN: 25 mg/dL — ABNORMAL HIGH (ref 8–23)
CO2: 20 mmol/L — ABNORMAL LOW (ref 22–32)
Calcium: 7.2 mg/dL — ABNORMAL LOW (ref 8.9–10.3)
Chloride: 111 mmol/L (ref 98–111)
Creatinine, Ser: 1.69 mg/dL — ABNORMAL HIGH (ref 0.61–1.24)
GFR, Estimated: 43 mL/min — ABNORMAL LOW (ref >60)
Glucose, Bld: 115 mg/dL — ABNORMAL HIGH (ref 70–99)
Potassium: 3.1 mmol/L — ABNORMAL LOW (ref 3.5–5.1)
Sodium: 140 mmol/L (ref 135–145)

## 2022-12-26 LAB — PREPARE RBC (CROSSMATCH)

## 2022-12-26 LAB — MAGNESIUM: Magnesium: 1.8 mg/dL (ref 1.7–2.4)

## 2022-12-26 MED ORDER — INSULIN ASPART 100 UNIT/ML IJ SOLN
0.0000 [IU] | Freq: Three times a day (TID) | INTRAMUSCULAR | Status: DC
  Administered 2022-12-27: 3 [IU] via SUBCUTANEOUS

## 2022-12-26 MED ORDER — SODIUM CHLORIDE 0.9% IV SOLUTION: Freq: Once | INTRAVENOUS | Status: DC

## 2022-12-26 MED ORDER — POTASSIUM CHLORIDE CRYS ER 20 MEQ PO TBCR
40.0000 meq | EXTENDED_RELEASE_TABLET | ORAL | Status: AC
  Administered 2022-12-26 (×2): 40 meq via ORAL
  Filled 2022-12-26 (×2): qty 2

## 2022-12-26 MED ORDER — GADOBUTROL 1 MMOL/ML IV SOLN: 6.5000 mL | Freq: Once | INTRAVENOUS | Status: DC | PRN

## 2022-12-26 NOTE — TOC Progression Note (Signed)
Transition of Care [facility]) - Progression Note    Patient Details  Name: Scott Bass MRN: [id number] Date of Birth: 21-Oct-1950  Transition [facility]) CM/SW Contact  Otelia Santee, LCSW Phone Number: 12/26/2022, 12:56 PM  Clinical Narrative:    Pt's insurance auth still pending.  Pt able to transfer to SNF once insurance has been approved.    Expected Discharge Plan: IP Rehab Facility Barriers to Discharge: Continued Medical Work up  Expected Discharge Plan and Services In-house Referral: Clinical Social Work     Living arrangements for the past 2 months: Single Family Home                                       Social Determinants of Health (SDOH) Interventions SDOH Screenings   Food Insecurity: Food Insecurity Present (12/06/2022)  Housing: Low Risk  (12/06/2022)  Transportation Needs: No Transportation Needs (12/06/2022)  Utilities: Not At Risk (12/24/2022)  Financial Resource Strain: Low Risk  (10/10/2022)   Received from [facility], [facility]  Physical Activity: Insufficiently Active (12/23/2020)   Received from [facility], [facility]  Social Connections: Unknown (09/16/2021)   Received from [facility], [facility]  Stress: No Stress Concern Present (12/23/2020)   Received from [place], [facility]  Tobacco Use: Low Risk  (12/06/2022)    Readmission Risk Interventions    12/19/2022   11:42 AM 12/07/2022   11:22 AM  Readmission Risk Prevention Plan  Transportation Screening Complete   Medication Review (RN Care Manager) Complete   PCP or Specialist appointment within 3-5 days of discharge Complete   HRI or Home Care Consult Complete Complete  SW Recovery Care/Counseling Consult Complete Complete  Palliative Care Screening Not Applicable   Skilled Nursing Facility Complete

## 2022-12-26 NOTE — Plan of Care (Signed)
?  Problem: Clinical Measurements: ?Goal: Ability to maintain clinical measurements within normal limits will improve ?Outcome: Progressing ?Goal: Will remain free from infection ?Outcome: Progressing ?Goal: Diagnostic test results will improve ?Outcome: Progressing ?  ?

## 2022-12-26 NOTE — Progress Notes (Signed)
PT Cancellation Note  Patient Details Name: Scott Bass MRN: [id number] DOB: 06/07/1950   Cancelled Treatment:      HgB 7.4 pt scheduled to receive blood.  Will attempt to see another day as LPT has rec SNF. Felecia Shelling  PTA Acute  Rehabilitation Services Office M-F          [phone]

## 2022-12-26 NOTE — Progress Notes (Addendum)
PROGRESS NOTE    Scott Bass  ZOX:[id number] DOB: 1951-02-18 DOA: 12/04/2022 PCP: Tracey Harries, MD   Brief Narrative: Scott Bass is a 72 y.o. male with a history of blindness, multiple myeloma not yet on therapy, hypertension, diabetes mellitus.  Patient presented secondary to a fall and was found to have severe acute on chronic renal failure with associated anemia and hemoglobin of 3.5.  Patient required ICU admission on presentation and was started on IV fluids for hydration in addition to receiving a total of 4 units of PRBC to date.  Nephrology was consulted for acute kidney injury concerning for possible complication of untreated known multiple myeloma in addition to hypovolemia/dehydration.  Patient was started empirically on antibiotics for possible infection with blood cultures significant for Staphylococcus capitis and 1 out of 4 bottles consistent with likely contaminant.  Renal function continued to improve during admission.  Patient started on chemotherapy for multiple myeloma while inpatient.   Assessment and Plan:  AKI on CKD stage IV Baseline creatinine of about 1.5-1.7. Creatinine of 11.76 on admission requiring ICU admission. Patient managed on IV fluids. Nephrology consulted with recommendation to defer hemodialysis secondary to patient's poor candidacy. Creatinine improved steadily with IV fluids and has continued to improve while off of IV fluids. Slight worsening secondary to poor oral intake from thickened liquids requirement. Further improvement with 1/2 NS IV fluids.  Multiple myeloma Diffuse pathological fractures Medical oncology consulted and started Velcade and Decadron while inpatient.  Pancytopenia WBC and platelets stable with unstable hemoglobin.  Hyperkalemia Likely secondary to AKI. Resolved.  Hypokalemia Potassium down to 3.1 today. -Potassium supplementation -Recheck magnesium level  Hypomagnesemia Supplementation  given.  Hyponatremia Resolved.  Hypernatremia Resolved.  Primary hypertension Patient is on losartan, amlodipine and metoprolol, listed as not taking. Amlodipine and metoprolol started during admission. Blood pressure low on amlodipine - now improved.  Metabolic acidosis Secondary to AKI. Improved.  Acute anemia Likely secondary to multiple myeloma. Patient received a total of 4 units of PRBC via transfusion this admission to date. Hemoglobin drifted down. Oncology recommendations for blood transfusion for hemoglobin < 7.5 g/dL -Transfuse 1 unit of PRBC -Post-transfusion H&H and CBC in AM  Dysphagia SLP recommendations: PO Diet Recommendation: Dysphagia 2 (Finely chopped);Mildly thick liquids (Level 2, nectar thick) Liquid Administration via: Cup;Straw Medication Administration: Crushed with puree Supervision: Full assist for feeding;Full supervision/cueing for swallowing strategies Swallowing strategies  : Minimize environmental distractions;Slow rate;Small bites/sips;effortful swallow;Clear throat intermittently;Multiple dry swallows after each bite/sip Postural changes: Position pt fully upright for meals  Tachycardia Resolved.  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 -Since creatinine is improved, will order MRI C- and T-spine w/wo contrast  Acute metabolic encephalopathy Secondary to uremia. Improved.  Constipation Resolved.  Hypothyroidism -Continue Synthroid  Glaucoma -Continue Xalatan and Alphagan  Folic acid deficiency -Continue folic acid  Moderate malnutrition Dietitian recommendation (8/19): Increase Ensure Plus High Protein po TID, each supplement provides 350 kcal and 20 grams of protein.  Magic cup BID with meals, each supplement provides 290 kcal and 9 grams of protein Mighty Shake BID with meals, each supplement provides 330 kcals and 9 grams of protein Encourage intake at all meals and of  supplements as tolerated.  Hypoalbuminemia Noted. Related to poor nutrition.   DVT prophylaxis: Heparin Code Status:   Code Status: DNR Family Communication: None at bedside Disposition Plan: Discharge to SNF when bed is available   Consultants:  Palliative care Psychiatry Nephrology  PCCM  Procedures:    Antimicrobials: Vancomycin Flagyl Fluconazole Ceftriaxone Cefepime   Subjective: No concerns this morning from patient.  Objective: BP 117/63 (BP Location: Right Arm)   Pulse 97   Temp 99.6 F (37.6 C) (Oral)   Resp 18   Ht 5\' 6"  (1.676 m)   Wt 66.6 kg   SpO2 96%   BMI 23.70 kg/m   Examination:  General exam: Appears calm and comfortable Respiratory system: Clear to auscultation. Respiratory effort normal. Cardiovascular system: S1 & S2 heard. Gastrointestinal system: Abdomen is nondistended, soft and nontender. Normal bowel sounds heard. Central nervous system: Alert and oriented. Musculoskeletal: No edema. No calf tenderness Psychiatry: Judgement and insight appear normal. Mood & affect appropriate.    Data Reviewed: I have personally reviewed following labs and imaging studies  CBC Lab Results  Component Value Date   WBC 5.9 12/26/2022   RBC 2.50 (L) 12/26/2022   HGB 7.4 (L) 12/26/2022   HCT 23.1 (L) 12/26/2022   MCV 92.4 12/26/2022   MCH 29.6 12/26/2022   PLT 66 (L) 12/26/2022   MCHC 32.0 12/26/2022   RDW 19.0 (H) 12/26/2022   LYMPHSABS 0.7 12/20/2022   MONOABS 0.5 12/20/2022   EOSABS 0.1 12/20/2022   BASOSABS 0.0 12/20/2022     Last metabolic panel Lab Results  Component Value Date   NA 140 12/26/2022   K 3.1 (L) 12/26/2022   CL 111 12/26/2022   CO2 20 (L) 12/26/2022   BUN 25 (H) 12/26/2022   CREATININE 1.69 (H) 12/26/2022   GLUCOSE 115 (H) 12/26/2022   GFRNONAA 43 (L) 12/26/2022   GFRAA >60 05/19/2015   CALCIUM 7.2 (L) 12/26/2022   PHOS 2.2 (L) 12/20/2022   PROT 5.3 (L) 12/20/2022   ALBUMIN 1.9 (L) 12/20/2022   LABGLOB  6.8 (H) 10/05/2022   AGRATIO 0.6 (L) 09/25/2022   BILITOT 0.8 12/20/2022   ALKPHOS 102 12/20/2022   AST 25 12/20/2022   ALT 28 12/20/2022   ANIONGAP 9 12/26/2022    GFR: Estimated Creatinine Clearance: 36.2 mL/min (A) (by C-G formula based on SCr of 1.69 mg/dL (H)).  No results found for this or any previous visit (from the past 240 hour(s)).    Radiology Studies: No results found.    LOS: 22 days    Jacquelin Hawking, MD [facility] 12/26/2022, 10:17 AM   If 7PM-7AM, please contact night-coverage [email or web address]

## 2022-12-27 ENCOUNTER — Inpatient Hospital Stay (HOSPITAL_COMMUNITY): Payer: Medicare PPO

## 2022-12-27 DIAGNOSIS — D649 Anemia, unspecified: Secondary | ICD-10-CM | POA: Diagnosis not present

## 2022-12-27 LAB — GLUCOSE, CAPILLARY
Glucose-Capillary: 102 mg/dL — ABNORMAL HIGH (ref 70–99)
Glucose-Capillary: 157 mg/dL — ABNORMAL HIGH (ref 70–99)
Glucose-Capillary: 53 mg/dL — ABNORMAL LOW (ref 70–99)
Glucose-Capillary: 71 mg/dL (ref 70–99)
Glucose-Capillary: 156 mg/dL — ABNORMAL HIGH (ref 70–99)

## 2022-12-27 LAB — CBC
HCT: 27.3 % — ABNORMAL LOW (ref 39.0–52.0)
Hemoglobin: 8.7 g/dL — ABNORMAL LOW (ref 13.0–17.0)
MCH: 29.4 pg (ref 26.0–34.0)
MCHC: 31.9 g/dL (ref 30.0–36.0)
MCV: 92.2 fL (ref 80.0–100.0)
Platelets: 76 10*3/uL — ABNORMAL LOW (ref 150–400)
RBC: 2.96 MIL/uL — ABNORMAL LOW (ref 4.22–5.81)
RDW: 18.2 % — ABNORMAL HIGH (ref 11.5–15.5)
WBC: 5.3 10*3/uL (ref 4.0–10.5)
nRBC: 0 % (ref 0.0–0.2)

## 2022-12-27 LAB — TYPE AND SCREEN
ABO/RH(D): B POS
Antibody Screen: NEGATIVE
Unit division: 0
Unit division: 0

## 2022-12-27 LAB — BPAM RBC
Blood Product Expiration Date: 202409032359
Blood Product Expiration Date: 202409152359
ISSUE DATE / TIME: 202408202044
ISSUE DATE / TIME: 202408202255
Unit Type and Rh: 5100
Unit Type and Rh: 7300

## 2022-12-27 LAB — COMPREHENSIVE METABOLIC PANEL
ALT: 20 U/L (ref 0–44)
AST: 16 U/L (ref 15–41)
Albumin: 1.8 g/dL — ABNORMAL LOW (ref 3.5–5.0)
Alkaline Phosphatase: 120 U/L (ref 38–126)
Anion gap: 10 (ref 5–15)
BUN: 19 mg/dL (ref 8–23)
CO2: 20 mmol/L — ABNORMAL LOW (ref 22–32)
Calcium: 7.5 mg/dL — ABNORMAL LOW (ref 8.9–10.3)
Chloride: 110 mmol/L (ref 98–111)
Creatinine, Ser: 1.53 mg/dL — ABNORMAL HIGH (ref 0.61–1.24)
GFR, Estimated: 48 mL/min — ABNORMAL LOW (ref >60)
Glucose, Bld: 113 mg/dL — ABNORMAL HIGH (ref 70–99)
Potassium: 3.5 mmol/L (ref 3.5–5.1)
Sodium: 140 mmol/L (ref 135–145)
Total Bilirubin: 1.5 mg/dL — ABNORMAL HIGH (ref 0.3–1.2)
Total Protein: 5 g/dL — ABNORMAL LOW (ref 6.5–8.1)

## 2022-12-27 NOTE — TOC Progression Note (Addendum)
Transition of Care [facility]) - Progression Note    Patient Details  Name: Scott Bass MRN: [id number] Date of Birth: 05/03/1951  Transition of Care [facility]) CM/SW Contact  Otelia Santee, LCSW Phone Number: 12/27/2022, 9:14 AM  Clinical Narrative:    Pt's insurance Berkley [employer] is at peer to peer level of review. Call in information provided to MD. Peer to peer to be complete by 11:30am. MD notified.   ADDENDUM: Pt's insurance Berkley Harvey has been approved. Auth ID: [id number]. Auth approved starting from 8/21 to 8/23.    Expected Discharge Plan: IP Rehab Facility Barriers to Discharge: Continued Medical Work up  Expected Discharge Plan and Services In-house Referral: Clinical Social Work     Living arrangements for the past 2 months: Single Family Home                                       Social Determinants of Health (SDOH) Interventions SDOH Screenings   Food Insecurity: Food Insecurity Present (12/06/2022)  Housing: Low Risk  (12/06/2022)  Transportation Needs: No Transportation Needs (12/06/2022)  Utilities: Not At Risk (12/24/2022)  Financial Resource Strain: Low Risk  (10/10/2022)   Received from [facility], [facility]  Physical Activity: Insufficiently Active (12/23/2020)   Received from [facility], [facility]  Social Connections: Unknown (09/16/2021)   Received from [facility], [facility]  Stress: No Stress Concern Present (12/23/2020)   Received from [facility], [facility]  Tobacco Use: Low Risk  (12/06/2022)    Readmission Risk Interventions    12/19/2022   11:42 AM 12/07/2022   11:22 AM  Readmission Risk Prevention Plan  Transportation Screening Complete   Medication Review (RN Care Manager) Complete   PCP or Specialist appointment within 3-5 days of discharge Complete   HRI or Home Care Consult Complete Complete  SW Recovery Care/Counseling Consult Complete Complete  Palliative Care Screening Not Applicable   Skilled Nursing Facility Complete

## 2022-12-27 NOTE — Progress Notes (Signed)
Mobility Specialist - Progress Note   12/27/22 1101  Mobility  Activity Ambulated with assistance in hallway  Level of Assistance Contact guard assist, steadying assist  Assistive Device Front wheel walker  Distance Ambulated (ft) 170 ft  Range of Motion/Exercises Active  Activity Response Tolerated well  Mobility Referral Yes  $Mobility charge 1 Mobility  Mobility Specialist Start Time (ACUTE ONLY) 1050  Mobility Specialist Stop Time (ACUTE ONLY) 1100  Mobility Specialist Time Calculation (min) (ACUTE ONLY) 10 min   Pt received in bed and agreed to mobility. Had no issues throughout session, returned to chair with all needs met, alarm on.   Marilynne Halsted Mobility Specialist

## 2022-12-27 NOTE — Progress Notes (Signed)
SLP Cancellation Note  Patient Details Name: MAKEL ELTING MRN: [id number] DOB: 1950-06-02   Cancelled treatment:       Reason Eval/Treat Not Completed: Other (comment) (pt on bedpan at this time; will continue efforts)  Rolena Infante, MS [facility] SLP Acute Rehab Services Office [phone]   Chales Abrahams 12/27/2022, 5:04 PM

## 2022-12-27 NOTE — Progress Notes (Signed)
PROGRESS NOTE    Scott Bass  ZOX:[id number] DOB: [birth year] DOA: 12/04/2022 PCP: Tracey Harries, MD  Chief Complaint  Patient presents with   Fall    Pt fell at home on carpet while walking with walkers, pt CBG per EMS 318 , tachy 140. Pt visionally impaired , vision in left eye only, wife called, also vision impaired, dementia , pt on metformin, and ASA. Pt denies LOC or hitting head     Brief Narrative:   Scott Bass is Scott Bass 72 y.o. male with Scott Bass history of blindness, multiple myeloma not yet on therapy, hypertension, diabetes mellitus. Patient presented secondary to Scott Bass fall and was found to have severe acute on chronic renal failure with associated anemia and hemoglobin of 3.5. Patient required ICU admission on presentation and was started on IV fluids for hydration in addition to receiving Scott Bass total of 4 units of PRBC to date. Nephrology was consulted for acute kidney injury concerning for possible complication of untreated known multiple myeloma in addition to hypovolemia/dehydration. Patient was started empirically on antibiotics for possible infection with blood cultures significant for Staphylococcus capitis and 1 out of 4 bottles consistent with likely contaminant. Renal function continued to improve during admission. Patient started on chemotherapy for multiple myeloma while inpatient.   Assessment & Plan:   Principal Problem:   Acute on chronic renal failure (HCC) Active Problems:   Multiple myeloma without remission (HCC)   Palliative care encounter   Lactic acidosis   Counseling and coordination of care   Goals of care, counseling/discussion   Acute renal failure (HCC)   Need for emotional support   DNR (do not resuscitate)   Acute delirium   Malnutrition of moderate degree   Absolute anemia  Cough Follow CXR  AKI on CKD stage IV Baseline creatinine of about 1.5-1.7. Creatinine of 11.76 on admission requiring ICU admission. Patient managed on IV fluids. Nephrology  consulted with recommendation to defer hemodialysis secondary to patient's poor candidacy. Creatinine improved steadily with IV fluids and has continued to improve while off of IV fluids. Slight worsening secondary to poor oral intake from thickened liquids requirement. Further improvement with 1/2 NS IV fluids.   Multiple myeloma Diffuse pathological fractures Medical oncology consulted and started Velcade and Decadron while inpatient.   Pancytopenia WBC and platelets stable with unstable hemoglobin.   Hyperkalemia Likely secondary to AKI. Resolved.   Hypokalemia Replace and follow  Hypomagnesemia Supplementation given.   Hyponatremia Resolved.   Hypernatremia Resolved.   Primary hypertension Patient is on losartan, amlodipine and metoprolol, listed as not taking. Amlodipine and metoprolol started during admission. Blood pressure low on amlodipine - now improved.   Metabolic acidosis Secondary to AKI. Improved.   Acute anemia Likely secondary to multiple myeloma. Patient received Scott Bass total of 5 units of PRBC via transfusion this admission to date. Hemoglobin drifted down. Oncology recommendations for blood transfusion for hemoglobin < 7.5 g/dL   Dysphagia SLP recommendations: PO Diet Recommendation: Dysphagia 2 (Finely chopped);Mildly thick liquids (Level 2, nectar thick) Liquid Administration via: Cup;Straw Medication Administration: Crushed with puree Supervision: Full assist for feeding;Full supervision/cueing for swallowing strategies Swallowing strategies  : Minimize environmental distractions;Slow rate;Small bites/sips;effortful swallow;Clear throat intermittently;Multiple dry swallows after each bite/sip Postural changes: Position pt fully upright for meals   Tachycardia Resolved.   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 MRI T/C spine limited by absence of IV contrast (patient declined to complete exam) - diffusely  decreased  T1 signal and somewhat heterogenously increased T2 hyperintense signal, c/w patient's known multiple myeloma.  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  No high grade spinal canal stenosis in the thoracic spine.  Mild L foraminal narrowing at T4-5 and T9-10. Follow outpatient    Acute metabolic encephalopathy Secondary to uremia. Improved.   Constipation Resolved.   Hypothyroidism -Continue Synthroid   Glaucoma -Continue Xalatan and Alphagan   Folic acid deficiency -Continue folic acid   Moderate malnutrition Dietitian recommendation (8/19): Increase Ensure Plus High Protein po TID, each supplement provides 350 kcal and 20 grams of protein.  Magic cup BID with meals, each supplement provides 290 kcal and 9 grams of protein Mighty Shake BID with meals, each supplement provides 330 kcals and 9 grams of protein Encourage intake at all meals and of supplements as tolerated.   Hypoalbuminemia Noted. Related to poor nutrition.    DVT prophylaxis: heparin Code Status: DNR Family Communication: none Disposition:   Status is: Inpatient Remains inpatient appropriate because: pending SNF discharge   Consultants:  oncology  Procedures:  none  Antimicrobials:  Anti-infectives (From admission, onward)    Start     Dose/Rate Route Frequency Ordered Stop   12/11/22 1000  fluconazole (DIFLUCAN) tablet 100 mg        100 mg Oral Daily 12/11/22 0844 12/16/22 0959   12/05/22 1000  cefTRIAXone (ROCEPHIN) 2 g in sodium chloride 0.9 % 100 mL IVPB        2 g 200 mL/hr over 30 Minutes Intravenous Every 24 hours 12/04/22 1340 12/08/22 1008   12/04/22 0600  ceFEPIme (MAXIPIME) 2 g in sodium chloride 0.9 % 100 mL IVPB        2 g 200 mL/hr over 30 Minutes Intravenous  Once 12/04/22 0557 12/04/22 0726   12/04/22 0600  metroNIDAZOLE (FLAGYL) IVPB 500 mg        500 mg 100 mL/hr over 60 Minutes Intravenous  Once 12/04/22  0557 12/04/22 0829   12/04/22 0600  vancomycin (VANCOCIN) IVPB 1000 mg/200 mL premix        1,000 mg 200 mL/hr over 60 Minutes Intravenous  Once 12/04/22 0557 12/04/22 0918       Subjective: No new complaints C/o increased cough  Objective: Vitals:   12/27/22 0216 12/27/22 0535 12/27/22 1032 12/27/22 1411  BP: 124/68 124/72 131/73 115/73  Pulse: 92 84 91 91  Resp:  17  20  Temp: 98.3 F (36.8 C) 98.7 F (37.1 C)  98.3 F (36.8 C)  TempSrc: Oral   Oral  SpO2: 99% 100%  99%  Weight:      Height:        Intake/Output Summary (Last 24 hours) at 12/27/2022 1541 Last data filed at 12/27/2022 1253 Gross per 24 hour  Intake 190 ml  Output 2400 ml  Net -2210 ml   Filed Weights   12/24/22 0500 12/25/22 0513 12/26/22 0412  Weight: 69.3 kg 70.5 kg 66.6 kg    Examination:  General: No acute distress. Cardiovascular: RRR Lungs: unlabored Neurological: Alert and oriented 3. Moves all extremities 4 with equal strength. Cranial nerves II through XII grossly intact. Extremities: No clubbing or cyanosis. No edema  Data Reviewed: I have personally reviewed following labs and imaging studies  CBC: Recent Labs  Lab 12/23/22 0640 12/24/22 0540 12/25/22 0510 12/26/22 0718 12/27/22 0717  WBC 5.2 6.2 6.7 5.9 5.3  HGB 7.8* 8.2* 8.0* 7.4* 8.7*  HCT  25.2* 26.1* 23.3* 23.1* 27.3*  MCV 94.4 94.9 88.6 92.4 92.2  PLT 89* 79* 63* 66* 76*    Basic Metabolic Panel: Recent Labs  Lab 12/23/22 0640 12/23/22 0951 12/24/22 0540 12/25/22 0510 12/26/22 0718 12/26/22 1054 12/27/22 0717  NA 138  --  140 142 140  --  140  K 2.9*  --  4.4 3.5 3.1*  --  3.5  CL 113*  --  113* 115* 111  --  110  CO2 16*  --  17* 20* 20*  --  20*  GLUCOSE 109*  --  111* 92 115*  --  113*  BUN 27*  --  26* 29* 25*  --  19  CREATININE 1.81*  --  1.95* 2.11* 1.69*  --  1.53*  CALCIUM 7.2*  --  7.6* 7.5* 7.2*  --  7.5*  MG  --  1.5*  --  1.8  --  1.8  --     GFR: Estimated Creatinine Clearance: 40  mL/min (Laban Orourke) (by C-G formula based on SCr of 1.53 mg/dL (H)).  Liver Function Tests: Recent Labs  Lab 12/27/22 0717  AST 16  ALT 20  ALKPHOS 120  BILITOT 1.5*  PROT 5.0*  ALBUMIN 1.8*    CBG: Recent Labs  Lab 12/26/22 1139 12/26/22 1637 12/26/22 2335 12/27/22 0747 12/27/22 1141  GLUCAP 106* 98 123* 102* 157*     No results found for this or any previous visit (from the past 240 hour(s)).       Radiology Studies: MR CERVICAL SPINE WO CONTRAST  Result Date: 12/27/2022 CLINICAL DATA:  Metastatic disease evaluation EXAM: MRI CERVICAL AND THORACIC SPINE WITHOUT CONTRAST TECHNIQUE: Multiplanar and multiecho pulse sequences of the cervical spine, to include the craniocervical junction and cervicothoracic junction, and the thoracic spine, were obtained without intravenous contrast. COMPARISON:  12/04/2022 CT cervical spine and CT chest FINDINGS: Evaluation is somewhat limited by the absence of intravenous contrast, which was not given as the patient declined to complete the exam. MRI CERVICAL SPINE FINDINGS Alignment: No significant listhesis. Vertebrae: Diffusely decreased T1 signal and somewhat heterogeneously increased T2 hyperintense signal, consistent with the patient's known multiple myeloma. No evidence of pathologic fracture. No evidence of discitis. Cord: Normal signal and morphology. Posterior Fossa, vertebral arteries, paraspinal tissues: No acute finding. Disc levels: C2-C3: Minimal disc bulge. No spinal canal stenosis or neural foraminal narrowing. C3-C4: No significant disc bulge. No spinal canal stenosis or neural foraminal narrowing. C4-C5: No significant disc bulge. No spinal canal stenosis or neuroforaminal narrowing. C5-C6: No significant disc bulge. No spinal canal stenosis or neuroforaminal narrowing. C6-C7: Central and right foraminal disc protrusions. Ligamentum flavum hypertrophy. Moderate spinal canal stenosis. Mild left and moderate to severe right neural foraminal  narrowing. C7-T1: No significant disc bulge. No spinal canal stenosis or neuroforaminal narrowing. MRI THORACIC SPINE FINDINGS Alignment: S-shaped curvature of the thoracolumbar spine. No listhesis. Vertebrae: Diffusely decreased T1 signal and somewhat heterogeneously increased T2 hyperintense signal, consistent with the patient's known multiple myeloma. No evidence of pathologic fracture. No evidence of discitis. Cord:  Normal signal and morphology Paraspinal and other soft tissues: Small left pleural effusion. Disc levels: No high-grade spinal canal stenosis. Mild left neural foraminal narrowing at T4-T5 and T9-T10. IMPRESSION: 1. Evaluation is somewhat limited by the absence of intravenous contrast, which was not given as the patient declined to complete the exam. Within this limitation, there is diffusely decreased T1 signal and somewhat heterogeneously increased T2 hyperintense signal, consistent with the patient's  known multiple myeloma. No evidence of pathologic fracture. 2. C6-C7 moderate spinal canal stenosis with mild left and moderate to severe right neural foraminal narrowing. 3. No high-grade spinal canal stenosis in the thoracic spine. Mild left neural foraminal narrowing at T4-T5 and T9-T10. Electronically Signed   By: Wiliam Ke M.D.   On: 12/27/2022 03:46   MR THORACIC SPINE WO CONTRAST  Result Date: 12/27/2022 CLINICAL DATA:  Metastatic disease evaluation EXAM: MRI CERVICAL AND THORACIC SPINE WITHOUT CONTRAST TECHNIQUE: Multiplanar and multiecho pulse sequences of the cervical spine, to include the craniocervical junction and cervicothoracic junction, and the thoracic spine, were obtained without intravenous contrast. COMPARISON:  12/04/2022 CT cervical spine and CT chest FINDINGS: Evaluation is somewhat limited by the absence of intravenous contrast, which was not given as the patient declined to complete the exam. MRI CERVICAL SPINE FINDINGS Alignment: No significant listhesis. Vertebrae:  Diffusely decreased T1 signal and somewhat heterogeneously increased T2 hyperintense signal, consistent with the patient's known multiple myeloma. No evidence of pathologic fracture. No evidence of discitis. Cord: Normal signal and morphology. Posterior Fossa, vertebral arteries, paraspinal tissues: No acute finding. Disc levels: C2-C3: Minimal disc bulge. No spinal canal stenosis or neural foraminal narrowing. C3-C4: No significant disc bulge. No spinal canal stenosis or neural foraminal narrowing. C4-C5: No significant disc bulge. No spinal canal stenosis or neuroforaminal narrowing. C5-C6: No significant disc bulge. No spinal canal stenosis or neuroforaminal narrowing. C6-C7: Central and right foraminal disc protrusions. Ligamentum flavum hypertrophy. Moderate spinal canal stenosis. Mild left and moderate to severe right neural foraminal narrowing. C7-T1: No significant disc bulge. No spinal canal stenosis or neuroforaminal narrowing. MRI THORACIC SPINE FINDINGS Alignment: S-shaped curvature of the thoracolumbar spine. No listhesis. Vertebrae: Diffusely decreased T1 signal and somewhat heterogeneously increased T2 hyperintense signal, consistent with the patient's known multiple myeloma. No evidence of pathologic fracture. No evidence of discitis. Cord:  Normal signal and morphology Paraspinal and other soft tissues: Small left pleural effusion. Disc levels: No high-grade spinal canal stenosis. Mild left neural foraminal narrowing at T4-T5 and T9-T10. IMPRESSION: 1. Evaluation is somewhat limited by the absence of intravenous contrast, which was not given as the patient declined to complete the exam. Within this limitation, there is diffusely decreased T1 signal and somewhat heterogeneously increased T2 hyperintense signal, consistent with the patient's known multiple myeloma. No evidence of pathologic fracture. 2. C6-C7 moderate spinal canal stenosis with mild left and moderate to severe right neural foraminal  narrowing. 3. No high-grade spinal canal stenosis in the thoracic spine. Mild left neural foraminal narrowing at T4-T5 and T9-T10. Electronically Signed   By: Wiliam Ke M.D.   On: 12/27/2022 03:46        Scheduled Meds:  sodium chloride   Intravenous Once   brimonidine  1 drop Both Eyes TID   feeding supplement  237 mL Oral TID BM   ferrous sulfate  325 mg Oral QODAY   folic acid  1 mg Oral Daily   heparin injection (subcutaneous)  5,000 Units Subcutaneous Q8H   insulin aspart  0-15 Units Subcutaneous TID WC   latanoprost  1 drop Both Eyes QHS   levothyroxine  150 mcg Oral Q0600   metoprolol tartrate  25 mg Oral BID   pantoprazole  40 mg Oral BID   senna-docusate  1 tablet Oral BID   sodium bicarbonate  1,300 mg Oral TID   tamsulosin  0.4 mg Oral Daily   Continuous Infusions:   LOS: 23 days    Time  spent: over 30 min    Lacretia Nicks, MD [facility]   To contact the attending provider between 7A-7P or the covering provider during after hours 7P-7A, please log into the web site [email or web address] and access using universal [place] password for that web site. If you do not have the password, please call the hospital operator.  12/27/2022, 3:41 PM

## 2022-12-27 NOTE — Progress Notes (Signed)
One unit RBC started 2335, ended 0215 without incident.  Unit signed off by Kerin Ransom Rn. Unable to document in Epic secondary to not populating.  Off line charting not available.  AC/blood bank aware

## 2022-12-27 NOTE — Progress Notes (Signed)
Occupational Therapy Treatment Patient Details Name: Scott Bass MRN: [id number] DOB: [birth year] Today's Date: 12/27/2022   History of present illness 72 year old male presented for fall and found confused, tachycardic and constipated with labs significant for AKI with hyperkalemia and severe metabolic acidosis and Hg 3.5. LA 6.0;  CTs 12/04/2022-innumerable lytic lesions, large expansile lesion in the anterior right second rib, multiple pathologic rib fractures, expansile T4 (T3 on CT cervical spine) mass with soft tissue component extending into the central canal and left neural foramen, C7 transverse process fracture, displaced fracture of the lower RIGHT scapula, of uncertain age. PHMx: blindness, recently diagnosed multiple myeloma in May 2024 but has not started therapy (diffuse lesions) , HTN, DM   OT comments  Pt reported issues with ongoing diarrhea today, as such he gently deferred attempts at out of bed activity. He required min assist for rolling left and right in bed, as well as for scooting to the head of the bed. He also required assist for toileting management, including posterior hygiene/peri-care in bed, as he was received on the bed pan upon this OT entering his room. Lastly, OT reinforced bed level exercises for strengthening needed to facilitate progressive ADL performance. Continue OT plan of care.       If plan is discharge home, recommend the following:  A little help with walking and/or transfers;A lot of help with bathing/dressing/bathroom;Assistance with cooking/housework;Assist for transportation;Help with stairs or ramp for entrance   Equipment Recommendations  BSC/3in1    Recommendations for Other Services      Precautions / Restrictions Precautions Precautions: Fall Precaution Comments: legally blind Restrictions Weight Bearing Restrictions: No       Mobility Bed Mobility   Bed Mobility: Rolling Rolling: Min assist         General bed mobility  comments: he required min assist for rolling in bed, as well as for scooting to the [facility] with the bed in the gravity eliminated position    Transfers                   General transfer comment: pt gently deferred attempts at out of bed activity, reporting issues with ongoing diarrhea today         ADL either performed or assessed with clinical judgement   ADL Overall ADL's : Needs assistance/impaired Eating/Feeding: Set up;Sitting Eating/Feeding Details (indicate cue type and reason): based on clinical judgement Grooming: Set up;Sitting Grooming Details (indicate cue type and reason): based on clinical judgement         Upper Body Dressing : Minimal assistance;Sitting Upper Body Dressing Details (indicate cue type and reason): based on clinical judgement Lower Body Dressing: Minimal assistance;Moderate assistance;Sit to/from stand Lower Body Dressing Details (indicate cue type and reason): based on clinical judgement                     Vision Baseline Vision/History: 2 Legally blind            Cognition Arousal: Alert Behavior During Therapy: WFL for tasks assessed/performed Overall Cognitive Status: Within Functional Limits for tasks assessed                          Pertinent Vitals/ Pain       Pain Assessment Pain Location: he reported a little pain in his hips when he coughs Pain Intervention(s): Limited activity within patient's tolerance, Monitored during session  Frequency  Min 1X/week        Progress Toward Goals  OT Goals(current goals can now be found in the care plan section)  Progress towards OT goals: Progressing toward goals  Acute Rehab OT Goals Patient Stated Goal: for diarrhea to resolve and to return home  Plan         AM-PAC OT "6 Clicks" Daily Activity     Outcome Measure   Help from another person eating meals?: A Little Help from another person taking care of personal grooming?: A Little Help  from another person toileting, which includes using toliet, bedpan, or urinal?: A Lot Help from another person bathing (including washing, rinsing, drying)?: A Lot Help from another person to put on and taking off regular upper body clothing?: A Little Help from another person to put on and taking off regular lower body clothing?: A Little 6 Click Score: 16    End of Session Equipment Utilized During Treatment: Other (comment) (N/A)  OT Visit Diagnosis: Unsteadiness on feet (R26.81);Muscle weakness (generalized) (M62.81)   Activity Tolerance Other (comment) (Fair tolerance)   Patient Left in bed;with call bell/phone within reach;with bed alarm set   Nurse Communication Other (comment) (pt had bowel movement in bedpan and bedpan placed in bathroom)        Time: 8295-6213 OT Time Calculation (min): 12 min  Charges: OT General Charges $OT Visit: 1 Visit OT Treatments $Therapeutic Activity: 8-22 mins      Reuben Likes, OTR/L 12/27/2022, 5:39 PM

## 2022-12-28 DIAGNOSIS — N179 Acute kidney failure, unspecified: Secondary | ICD-10-CM | POA: Diagnosis not present

## 2022-12-28 LAB — CBC WITH DIFFERENTIAL/PLATELET
Abs Immature Granulocytes: 0.06 10*3/uL (ref 0.00–0.07)
Basophils Absolute: 0 10*3/uL (ref 0.0–0.1)
Basophils Relative: 0 %
Eosinophils Absolute: 0.1 10*3/uL (ref 0.0–0.5)
Eosinophils Relative: 1 %
HCT: 27.8 % — ABNORMAL LOW (ref 39.0–52.0)
Hemoglobin: 9 g/dL — ABNORMAL LOW (ref 13.0–17.0)
Immature Granulocytes: 1 %
Lymphocytes Relative: 10 %
Lymphs Abs: 0.6 10*3/uL — ABNORMAL LOW (ref 0.7–4.0)
MCH: 29.4 pg (ref 26.0–34.0)
MCHC: 32.4 g/dL (ref 30.0–36.0)
MCV: 90.8 fL (ref 80.0–100.0)
Monocytes Absolute: 0.6 10*3/uL (ref 0.1–1.0)
Monocytes Relative: 11 %
Neutro Abs: 4.5 10*3/uL (ref 1.7–7.7)
Neutrophils Relative %: 77 %
Platelets: 94 10*3/uL — ABNORMAL LOW (ref 150–400)
RBC: 3.06 MIL/uL — ABNORMAL LOW (ref 4.22–5.81)
RDW: 18.2 % — ABNORMAL HIGH (ref 11.5–15.5)
WBC: 5.8 10*3/uL (ref 4.0–10.5)
nRBC: 0 % (ref 0.0–0.2)

## 2022-12-28 LAB — COMPREHENSIVE METABOLIC PANEL
ALT: 18 U/L (ref 0–44)
AST: 15 U/L (ref 15–41)
Albumin: 1.8 g/dL — ABNORMAL LOW (ref 3.5–5.0)
Alkaline Phosphatase: 123 U/L (ref 38–126)
Anion gap: 8 (ref 5–15)
BUN: 19 mg/dL (ref 8–23)
CO2: 21 mmol/L — ABNORMAL LOW (ref 22–32)
Calcium: 7.6 mg/dL — ABNORMAL LOW (ref 8.9–10.3)
Chloride: 112 mmol/L — ABNORMAL HIGH (ref 98–111)
Creatinine, Ser: 1.56 mg/dL — ABNORMAL HIGH (ref 0.61–1.24)
GFR, Estimated: 47 mL/min — ABNORMAL LOW (ref >60)
Glucose, Bld: 120 mg/dL — ABNORMAL HIGH (ref 70–99)
Potassium: 3.4 mmol/L — ABNORMAL LOW (ref 3.5–5.1)
Sodium: 141 mmol/L (ref 135–145)
Total Bilirubin: 1.5 mg/dL — ABNORMAL HIGH (ref 0.3–1.2)
Total Protein: 5 g/dL — ABNORMAL LOW (ref 6.5–8.1)

## 2022-12-28 LAB — GLUCOSE, CAPILLARY
Glucose-Capillary: 118 mg/dL — ABNORMAL HIGH (ref 70–99)
Glucose-Capillary: 224 mg/dL — ABNORMAL HIGH (ref 70–99)
Glucose-Capillary: 165 mg/dL — ABNORMAL HIGH (ref 70–99)
Glucose-Capillary: 259 mg/dL — ABNORMAL HIGH (ref 70–99)

## 2022-12-28 LAB — MAGNESIUM: Magnesium: 1.5 mg/dL — ABNORMAL LOW (ref 1.7–2.4)

## 2022-12-28 LAB — PHOSPHORUS: Phosphorus: 1.6 mg/dL — ABNORMAL LOW (ref 2.5–4.6)

## 2022-12-28 MED ORDER — METOPROLOL TARTRATE 50 MG PO TABS
50.0000 mg | ORAL_TABLET | Freq: Two times a day (BID) | ORAL | Status: DC
  Administered 2022-12-28 – 2022-12-29 (×2): 50 mg via ORAL
  Filled 2022-12-28 (×2): qty 1

## 2022-12-28 MED ORDER — LACTATED RINGERS IV BOLUS
500.0000 mL | Freq: Once | INTRAVENOUS | Status: AC
  Administered 2022-12-28: 500 mL via INTRAVENOUS

## 2022-12-28 MED ORDER — MAGNESIUM SULFATE 4 GM/100ML IV SOLN
4.0000 g | Freq: Once | INTRAVENOUS | Status: AC
  Administered 2022-12-28: 4 g via INTRAVENOUS
  Filled 2022-12-28: qty 100

## 2022-12-28 MED ORDER — K PHOS MONO-SOD PHOS DI & MONO 155-852-130 MG PO TABS
500.0000 mg | ORAL_TABLET | Freq: Four times a day (QID) | ORAL | Status: DC
  Administered 2022-12-28 – 2022-12-29 (×5): 500 mg via ORAL
  Filled 2022-12-28 (×6): qty 2

## 2022-12-28 MED ORDER — POTASSIUM CHLORIDE CRYS ER 20 MEQ PO TBCR
40.0000 meq | EXTENDED_RELEASE_TABLET | Freq: Once | ORAL | Status: AC
  Administered 2022-12-28: 40 meq via ORAL
  Filled 2022-12-28: qty 2

## 2022-12-28 MED ORDER — INSULIN ASPART 100 UNIT/ML IJ SOLN
0.0000 [IU] | Freq: Three times a day (TID) | INTRAMUSCULAR | Status: DC
  Administered 2022-12-28 (×2): 2 [IU] via SUBCUTANEOUS

## 2022-12-28 NOTE — Progress Notes (Addendum)
Speech Language Pathology Treatment: Dysphagia  Patient Details Name: Scott Bass MRN: [id number] DOB: [birth year] Today's Date: 12/28/2022 Time: 3086-5784 SLP Time Calculation (min) (ACUTE ONLY): 14 min  Assessment / Plan / Recommendation Clinical Impression  Reviewed results of MBS from last Friday with pt and need for continued nectar thick liquids (wife not present), Educated pt on strategies to swallow twice and clear throat to aid in reduce residue and potential penetrates in which he needs significant cues to follow. Pt observed with nectar thick juice and graham cracker in applesauce to simulate Dys 2 texture. There were no s/s aspiration and needed cues for strategies which he demonstrated. He was able to clear oral cavity following solids. He continues to voice displeasure with thick liquids and requests water. SLP will initiate the water protocol where pt may have thin water 1) in between meals 2) 30 min after last eating/drinking and 3) oral hygiene prior to water. Discussed/educated RN and placed sign above pt's bed. Observed pt with several small sips water without incident although pt may not be sensate to compromised airway. Continue Dys 2, nectar thick liquids and water protocol. RN states pt may be discharged soon to SNF- continue all rec's at next venue of care.    HPI HPI: 72 year old wheel chair bound male with blindness, recently diagnosed multiple myeloma in May 2024 but not has started therapy, HTN, DM who presented for fall and found confused, tachycardic and constipated with labs significant for AKI with hyperkalemia and severe metabolic acidosis and Hg 3.5. LA 6.0. PCCM consulted for admission. In the ED started on broad spectrum abx and fluid resuscitation; CT chest indicated No acute findings within the chest, abdomen or pelvis. No  evidence of solid organ injury. No hemorrhage or edema within the  mediastinum. No pleural effusion or pneumothorax ; ST consulted for swallow  evaluation d/t dysphagia noted with liquids per nursing report.  PT has been maintained on nectar liquid diet and water protocol - he demonstrated some vomiting with po which is not always coorelated to po intake per pt.   Reeval ordered.      SLP Plan  Continue with current plan of care      Recommendations for follow up therapy are one component of a multi-disciplinary discharge planning process, led by the attending physician.  Recommendations may be updated based on patient status, additional functional criteria and insurance authorization.    Recommendations  Diet recommendations: Dysphagia 2 (fine chop);Nectar-thick liquid;Other(comment) (water protocol) Liquids provided via: Cup;No straw Medication Administration: Whole meds with puree Supervision: Full supervision/cueing for compensatory strategies Compensations: Slow rate;Small sips/bites Postural Changes and/or Swallow Maneuvers: Seated upright 90 degrees;Upright 30-60 min after meal                  Oral care BID   Frequent or constant Supervision/Assistance Dysphagia, oropharyngeal phase (R13.12)     Continue with current plan of care     Royce Macadamia  12/28/2022, 11:59 AM

## 2022-12-28 NOTE — Progress Notes (Signed)
Mobility Specialist - Progress Note   12/28/22 1318  Mobility  Activity Stood at bedside  Level of Assistance Contact guard assist, steadying assist  Assistive Device Front wheel walker  Activity Response Tolerated well  Mobility Referral Yes  $Mobility charge 1 Mobility  Mobility Specialist Start Time (ACUTE ONLY) 1310  Mobility Specialist Stop Time (ACUTE ONLY) 1318  Mobility Specialist Time Calculation (min) (ACUTE ONLY) 8 min   Pt received in bed after requesting that he wants to ambulate again despite just ending session with PT ago. Once standing, pt realized he was too weak to take any further steps. Assisted pt w/ side stepping to head of bed. No complaints during session. Pt to bed after session with all needs met. Bed alarm on.   [facility]

## 2022-12-28 NOTE — Progress Notes (Addendum)
PROGRESS NOTE    Scott Bass  WGN:[id number] DOB: 1950/06/02 DOA: 12/04/2022 PCP: Tracey Harries, MD  Chief Complaint  Patient presents with   Fall    Pt fell at home on carpet while walking with walkers, pt CBG per EMS 318 , tachy 140. Pt visionally impaired , vision in left eye only, wife called, also vision impaired, dementia , pt on metformin, and ASA. Pt denies LOC or hitting head     Brief Narrative:   Scott Bass is Scott Bass 72 y.o. male with Scott Bass history of blindness, multiple myeloma not yet on therapy, hypertension, diabetes mellitus. Patient presented secondary to Scott Bass fall and was found to have severe acute on chronic renal failure with associated anemia and hemoglobin of 3.5. Patient required ICU admission on presentation and was started on IV fluids for hydration in addition to receiving Scott Bass total of 4 units of PRBC to date. Nephrology was consulted for acute kidney injury concerning for possible complication of untreated known multiple myeloma in addition to hypovolemia/dehydration. Patient was started empirically on antibiotics for possible infection with blood cultures significant for Staphylococcus capitis and 1 out of 4 bottles consistent with likely contaminant. Renal function continued to improve during admission. Patient started on chemotherapy for multiple myeloma while inpatient.   Assessment & Plan:   Principal Problem:   Acute on chronic renal failure (HCC) Active Problems:   Multiple myeloma without remission (HCC)   Palliative care encounter   Lactic acidosis   Counseling and coordination of care   Goals of care, counseling/discussion   Acute renal failure (HCC)   Need for emotional support   DNR (do not resuscitate)   Acute delirium   Malnutrition of moderate degree   Symptomatic anemia  Sinus Tachycardia Tachy to 110's today (HR was in 90's yesterday, but after 7 PM, seemed to worsen) Not immediately clear cause of tachycardia - he denies CP, SOB --- RN notes  anxiety, confusion (sounds chronic)  Follow EKG and response to bolus  Continue metoprolol   AKI on CKD stage IV Baseline creatinine of about 1.5-1.7. Creatinine of 11.76 on admission requiring ICU admission. Patient managed on IV fluids. Nephrology consulted with recommendation to defer hemodialysis secondary to patient's poor candidacy.  Improved overall back to baseline   Multiple myeloma Diffuse pathological fractures Medical oncology consulted and started Velcade and Decadron while inpatient.   Cough CXR with underinflation, no pulm edema No complaints today  Pancytopenia Relatively stable - improving counts recently   Hyperkalemia resolved   Hypokalemia Replace and follow  Hypomagnesemia Supplementation given.   Hypophosphatemia Replace and follow   Hyponatremia Resolved.   Hypernatremia Resolved.   Primary hypertension Patient is on losartan, amlodipine and metoprolol, listed as not taking. Amlodipine and metoprolol started during admission. Blood pressure low on amlodipine - now improved.   Metabolic acidosis Secondary to AKI. Improved.   Acute anemia Likely secondary to multiple myeloma. Patient received Scott Bass total of 5 units of PRBC via transfusion this admission to date. Hemoglobin drifted down. Oncology recommendations for blood transfusion for hemoglobin < 7.5 g/dL   Dysphagia SLP recommendations: PO Diet Recommendation: Dysphagia 2 (Finely chopped);Mildly thick liquids (Level 2, nectar thick).  Ok for thin water in between meals, 30 min after eating and after oral care.  Liquid Administration via: Cup;Straw Medication Administration: Crushed with puree Supervision: Full assist for feeding;Full supervision/cueing for swallowing strategies Swallowing strategies  : Minimize environmental distractions;Slow rate;Small bites/sips;effortful swallow;Clear throat intermittently;Multiple dry swallows after each bite/sip Postural  changes: Position pt fully upright  for meals   T4 vertebral body mass Noted on CT imaging with recommendation for MRI C-/T-spine w/wo. MRI T/C spine limited by absence of IV contrast (patient declined to complete exam) - diffusely decreased T1 signal and somewhat heterogenously increased T2 hyperintense signal, c/w patient's known multiple myeloma.  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  No high grade spinal canal stenosis in the thoracic spine.  Mild L foraminal narrowing at T4-5 and T9-10. Follow outpatient    Acute metabolic encephalopathy Secondary to uremia. Resolved..   Constipation Resolved.   Hypothyroidism -Continue Synthroid   Glaucoma -Continue Xalatan and Alphagan   Folic acid deficiency -Continue folic acid   Moderate malnutrition Dietitian recommendation (8/19): Increase Ensure Plus High Protein po TID, each supplement provides 350 kcal and 20 grams of protein.  Magic cup BID with meals, each supplement provides 290 kcal and 9 grams of protein Mighty Shake BID with meals, each supplement provides 330 kcals and 9 grams of protein Encourage intake at all meals and of supplements as tolerated.   Hypoalbuminemia Noted. Related to poor nutrition.    DVT prophylaxis: heparin Code Status: DNR Family Communication: none Disposition:   Status is: Inpatient Remains inpatient appropriate because: pending SNF discharge   Consultants:  oncology  Procedures:  none  Antimicrobials:  Anti-infectives (From admission, onward)    Start     Dose/Rate Route Frequency Ordered Stop   12/11/22 1000  fluconazole (DIFLUCAN) tablet 100 mg        100 mg Oral Daily 12/11/22 0844 12/16/22 0959   12/05/22 1000  cefTRIAXone (ROCEPHIN) 2 g in sodium chloride 0.9 % 100 mL IVPB        2 g 200 mL/hr over 30 Minutes Intravenous Every 24 hours 12/04/22 1340 12/08/22 1008   12/04/22 0600  ceFEPIme (MAXIPIME) 2 g in sodium chloride 0.9 % 100 mL IVPB         2 g 200 mL/hr over 30 Minutes Intravenous  Once 12/04/22 0557 12/04/22 0726   12/04/22 0600  metroNIDAZOLE (FLAGYL) IVPB 500 mg        500 mg 100 mL/hr over 60 Minutes Intravenous  Once 12/04/22 0557 12/04/22 0829   12/04/22 0600  vancomycin (VANCOCIN) IVPB 1000 mg/200 mL premix        1,000 mg 200 mL/hr over 60 Minutes Intravenous  Once 12/04/22 0557 12/04/22 0918       Subjective: No new complaints, just generalized weakness  Objective: Vitals:   12/27/22 1032 12/27/22 1411 12/27/22 1948 12/28/22 0511  BP: 131/73 115/73 111/82 119/72  Pulse: 91 91 (!) 107 (!) 107  Resp:  20 17 17   Temp:  98.3 F (36.8 C) 98.4 F (36.9 C) 98.1 F (36.7 C)  TempSrc:  Oral Oral Oral  SpO2:  99% 94% 98%  Weight:      Height:        Intake/Output Summary (Last 24 hours) at 12/28/2022 1023 Last data filed at 12/27/2022 2100 Gross per 24 hour  Intake --  Output 1000 ml  Net -1000 ml   Filed Weights   12/24/22 0500 12/25/22 0513 12/26/22 0412  Weight: 69.3 kg 70.5 kg 66.6 kg    Examination:  General: No acute distress. Cardiovascular: tachycardic - sinus tach on tele Lungs: Clear to auscultation bilaterally Abdomen: Soft, nontender, nondistended with normal active bowel sounds. No masses. No hepatosplenomegaly. Neurological: Alert and oriented 3. Moves all  extremities 4 with equal strength. Cranial nerves II through XII grossly intact. Extremities: No clubbing or cyanosis. No edema.   Data Reviewed: I have personally reviewed following labs and imaging studies  CBC: Recent Labs  Lab 12/24/22 0540 12/25/22 0510 12/26/22 0718 12/27/22 0717 12/28/22 0532  WBC 6.2 6.7 5.9 5.3 5.8  NEUTROABS  --   --   --   --  4.5  HGB 8.2* 8.0* 7.4* 8.7* 9.0*  HCT 26.1* 23.3* 23.1* 27.3* 27.8*  MCV 94.9 88.6 92.4 92.2 90.8  PLT 79* 63* 66* 76* 94*    Basic Metabolic Panel: Recent Labs  Lab 12/23/22 0951 12/24/22 0540 12/25/22 0510 12/26/22 0718 12/26/22 1054 12/27/22 0717  12/28/22 0532  NA  --  140 142 140  --  140 141  K  --  4.4 3.5 3.1*  --  3.5 3.4*  CL  --  113* 115* 111  --  110 112*  CO2  --  17* 20* 20*  --  20* 21*  GLUCOSE  --  111* 92 115*  --  113* 120*  BUN  --  26* 29* 25*  --  19 19  CREATININE  --  1.95* 2.11* 1.69*  --  1.53* 1.56*  CALCIUM  --  7.6* 7.5* 7.2*  --  7.5* 7.6*  MG 1.5*  --  1.8  --  1.8  --  1.5*  PHOS  --   --   --   --   --   --  1.6*    GFR: Estimated Creatinine Clearance: 39.2 mL/min (Jeovany Huitron) (by C-G formula based on SCr of 1.56 mg/dL (H)).  Liver Function Tests: Recent Labs  Lab 12/27/22 0717 12/28/22 0532  AST 16 15  ALT 20 18  ALKPHOS 120 123  BILITOT 1.5* 1.5*  PROT 5.0* 5.0*  ALBUMIN 1.8* 1.8*    CBG: Recent Labs  Lab 12/27/22 1141 12/27/22 1632 12/27/22 1657 12/27/22 1948 12/28/22 0722  GLUCAP 157* 53* 71 156* 118*     No results found for this or any previous visit (from the past 240 hour(s)).       Radiology Studies: DG CHEST PORT 1 VIEW  Result Date: 12/27/2022 CLINICAL DATA:  Cough.  Blood transfusion earlier today EXAM: PORTABLE CHEST 1 VIEW COMPARISON:  X-ray 12/04/2022 and CT FINDINGS: Portable semi upright view of the chest demonstrates underinflation. Calcified aorta. Normal cardiopericardial silhouette. No pneumothorax, effusion or consolidation. No edema. Degenerative changes seen. The lytic lesions identified by CT are only partially seen on the current examination. Please correlate for known lung mass and destructive spine process. IMPRESSION: Underinflation.  No pulmonary edema. Please correlate with prior workup for evaluation of known neoplasm including lung lesion and bone lesions Electronically Signed   By: Karen Kays M.D.   On: 12/27/2022 17:43   MR CERVICAL SPINE WO CONTRAST  Result Date: 12/27/2022 CLINICAL DATA:  Metastatic disease evaluation EXAM: MRI CERVICAL AND THORACIC SPINE WITHOUT CONTRAST TECHNIQUE: Multiplanar and multiecho pulse sequences of the cervical  spine, to include the craniocervical junction and cervicothoracic junction, and the thoracic spine, were obtained without intravenous contrast. COMPARISON:  12/04/2022 CT cervical spine and CT chest FINDINGS: Evaluation is somewhat limited by the absence of intravenous contrast, which was not given as the patient declined to complete the exam. MRI CERVICAL SPINE FINDINGS Alignment: No significant listhesis. Vertebrae: Diffusely decreased T1 signal and somewhat heterogeneously increased T2 hyperintense signal, consistent with the patient's known multiple myeloma. No evidence of pathologic  fracture. No evidence of discitis. Cord: Normal signal and morphology. Posterior Fossa, vertebral arteries, paraspinal tissues: No acute finding. Disc levels: C2-C3: Minimal disc bulge. No spinal canal stenosis or neural foraminal narrowing. C3-C4: No significant disc bulge. No spinal canal stenosis or neural foraminal narrowing. C4-C5: No significant disc bulge. No spinal canal stenosis or neuroforaminal narrowing. C5-C6: No significant disc bulge. No spinal canal stenosis or neuroforaminal narrowing. C6-C7: Central and right foraminal disc protrusions. Ligamentum flavum hypertrophy. Moderate spinal canal stenosis. Mild left and moderate to severe right neural foraminal narrowing. C7-T1: No significant disc bulge. No spinal canal stenosis or neuroforaminal narrowing. MRI THORACIC SPINE FINDINGS Alignment: S-shaped curvature of the thoracolumbar spine. No listhesis. Vertebrae: Diffusely decreased T1 signal and somewhat heterogeneously increased T2 hyperintense signal, consistent with the patient's known multiple myeloma. No evidence of pathologic fracture. No evidence of discitis. Cord:  Normal signal and morphology Paraspinal and other soft tissues: Small left pleural effusion. Disc levels: No high-grade spinal canal stenosis. Mild left neural foraminal narrowing at T4-T5 and T9-T10. IMPRESSION: 1. Evaluation is somewhat limited by  the absence of intravenous contrast, which was not given as the patient declined to complete the exam. Within this limitation, there is diffusely decreased T1 signal and somewhat heterogeneously increased T2 hyperintense signal, consistent with the patient's known multiple myeloma. No evidence of pathologic fracture. 2. C6-C7 moderate spinal canal stenosis with mild left and moderate to severe right neural foraminal narrowing. 3. No high-grade spinal canal stenosis in the thoracic spine. Mild left neural foraminal narrowing at T4-T5 and T9-T10. Electronically Signed   By: Wiliam Ke M.D.   On: 12/27/2022 03:46   MR THORACIC SPINE WO CONTRAST  Result Date: 12/27/2022 CLINICAL DATA:  Metastatic disease evaluation EXAM: MRI CERVICAL AND THORACIC SPINE WITHOUT CONTRAST TECHNIQUE: Multiplanar and multiecho pulse sequences of the cervical spine, to include the craniocervical junction and cervicothoracic junction, and the thoracic spine, were obtained without intravenous contrast. COMPARISON:  12/04/2022 CT cervical spine and CT chest FINDINGS: Evaluation is somewhat limited by the absence of intravenous contrast, which was not given as the patient declined to complete the exam. MRI CERVICAL SPINE FINDINGS Alignment: No significant listhesis. Vertebrae: Diffusely decreased T1 signal and somewhat heterogeneously increased T2 hyperintense signal, consistent with the patient's known multiple myeloma. No evidence of pathologic fracture. No evidence of discitis. Cord: Normal signal and morphology. Posterior Fossa, vertebral arteries, paraspinal tissues: No acute finding. Disc levels: C2-C3: Minimal disc bulge. No spinal canal stenosis or neural foraminal narrowing. C3-C4: No significant disc bulge. No spinal canal stenosis or neural foraminal narrowing. C4-C5: No significant disc bulge. No spinal canal stenosis or neuroforaminal narrowing. C5-C6: No significant disc bulge. No spinal canal stenosis or neuroforaminal  narrowing. C6-C7: Central and right foraminal disc protrusions. Ligamentum flavum hypertrophy. Moderate spinal canal stenosis. Mild left and moderate to severe right neural foraminal narrowing. C7-T1: No significant disc bulge. No spinal canal stenosis or neuroforaminal narrowing. MRI THORACIC SPINE FINDINGS Alignment: S-shaped curvature of the thoracolumbar spine. No listhesis. Vertebrae: Diffusely decreased T1 signal and somewhat heterogeneously increased T2 hyperintense signal, consistent with the patient's known multiple myeloma. No evidence of pathologic fracture. No evidence of discitis. Cord:  Normal signal and morphology Paraspinal and other soft tissues: Small left pleural effusion. Disc levels: No high-grade spinal canal stenosis. Mild left neural foraminal narrowing at T4-T5 and T9-T10. IMPRESSION: 1. Evaluation is somewhat limited by the absence of intravenous contrast, which was not given as the patient declined to complete the exam. Within  this limitation, there is diffusely decreased T1 signal and somewhat heterogeneously increased T2 hyperintense signal, consistent with the patient's known multiple myeloma. No evidence of pathologic fracture. 2. C6-C7 moderate spinal canal stenosis with mild left and moderate to severe right neural foraminal narrowing. 3. No high-grade spinal canal stenosis in the thoracic spine. Mild left neural foraminal narrowing at T4-T5 and T9-T10. Electronically Signed   By: Wiliam Ke M.D.   On: 12/27/2022 03:46        Scheduled Meds:  sodium chloride   Intravenous Once   brimonidine  1 drop Both Eyes TID   feeding supplement  237 mL Oral TID BM   ferrous sulfate  325 mg Oral QODAY   folic acid  1 mg Oral Daily   heparin injection (subcutaneous)  5,000 Units Subcutaneous Q8H   insulin aspart  0-6 Units Subcutaneous TID WC   latanoprost  1 drop Both Eyes QHS   levothyroxine  150 mcg Oral Q0600   metoprolol tartrate  25 mg Oral BID   pantoprazole  40 mg Oral  BID   phosphorus  500 mg Oral QID   senna-docusate  1 tablet Oral BID   sodium bicarbonate  1,300 mg Oral TID   tamsulosin  0.4 mg Oral Daily   Continuous Infusions:  lactated ringers     magnesium sulfate bolus IVPB 4 g (12/28/22 0908)     LOS: 24 days    Time spent: over 30 min    Lacretia Nicks, MD [facility]   To contact the attending provider between 7A-7P or the covering provider during after hours 7P-7A, please log into the web site [email or web address] and access using universal  password for that web site. If you do not have the password, please call the hospital operator.  12/28/2022, 10:23 AM

## 2022-12-28 NOTE — Inpatient Diabetes Management (Signed)
Inpatient Diabetes Program Recommendations  AACE/ADA: New Consensus Statement on Inpatient Glycemic Control (2015)  Target Ranges:  Prepandial:   less than 140 mg/dL      Peak postprandial:   less than 180 mg/dL (1-2 hours)      Critically ill patients:  140 - 180 mg/dL    Latest Reference Range & Units 12/27/22 07:47 12/27/22 11:41 12/27/22 16:32 12/27/22 16:57 12/27/22 19:48  Glucose-Capillary 70 - 99 mg/dL 322 (H) 025 (H)  3 units Novolog  53 (L) 71 156 (H)  (H): Data is abnormally high (L): Data is abnormally low   Admit with: Acute on chronic renal failure/ Anemia  History: DM2, Multiple Myeloma  Home DM Meds: Jardiance 25 mg daily (NOT taking)       Amaryl 1 mg daily (NOT taking)       Metformin 1000 mg BID (NOT taking)  Current Orders: Novolog Moderate Correction Scale/ SSI (0-15 units) TID AC     MD- Note Hypoglycemia yest at 4pm after receiving 3 units Novolog SSI at 12pm  Please consider reducing the Novolog SSI to the Very Sensitive scale 0-6 units TID AC    --Will follow patient during hospitalization--  Ambrose Finland RN, MSN, CDCES Diabetes Coordinator Inpatient Glycemic Control Team Team Pager: [phone] (8a-5p)

## 2022-12-28 NOTE — Progress Notes (Signed)
Physical Therapy Treatment Patient Details Name: Scott Bass MRN: [id number] DOB: Dec 03, 1950 Today's Date: 12/28/2022   History of Present Illness 72 year old male presented for fall and found confused, tachycardic and constipated with labs significant for AKI with hyperkalemia and severe metabolic acidosis and Hg 3.5. LA 6.0;  CTs 12/04/2022-innumerable lytic lesions, large expansile lesion in the anterior right second rib, multiple pathologic rib fractures, expansile T4 (T3 on CT cervical spine) mass with soft tissue component extending into the central canal and left neural foramen, C7 transverse process fracture, displaced fracture of the lower RIGHT scapula, of uncertain age. PHMx: blindness (can see some, but not details), recently diagnosed multiple myeloma in May 2024 but has not started therapy (diffuse lesions) , HTN, DM    PT Comments  Pt is progressing toward acute PT goals this session with progression of ambulation distance and decreased assist needed this date. Pt ambulated ~79ft with MIN A- CGA, complaints of increased LE fatigue. HR mAX up to 140bpm with ambulation, 122bpm at end of session.  Pt also able to perform LE there ex following seated rest break.  Pt will benefit from continued skilled PT to increase their independence and maximize safety with mobility.      If plan is discharge home, recommend the following: A little help with walking and/or transfers;A little help with bathing/dressing/bathroom;Assistance with cooking/housework;Assist for transportation;Help with stairs or ramp for entrance   Can travel by private vehicle     Yes  Equipment Recommendations  None recommended by PT    Recommendations for Other Services       Precautions / Restrictions Precautions Precautions: Fall Precaution Comments: legally blind Restrictions Weight Bearing Restrictions: No     Mobility  Bed Mobility Overal bed mobility: Needs Assistance Bed Mobility: Supine to Sit,  Sit to Supine     Supine to sit: Min assist, HOB elevated (HOB 18deg) Sit to supine: Contact guard assist, Used rails, HOB elevated   General bed mobility comments: MIN A to bring truk to upright    Transfers Overall transfer level: Needs assistance Equipment used: Rolling walker (2 wheels) Transfers: Sit to/from Stand Sit to Stand: Mod assist           General transfer comment: MOD A on initial stand attempt, progressed to MIN A-CGA with elevated bed height during funtional strengthening reps.    Ambulation/Gait Ambulation/Gait assistance: Min assist Gait Distance (Feet): 70 Feet Assistive device: Rolling walker (2 wheels) Gait Pattern/deviations: Step-through pattern, Trunk flexed Gait velocity: decr     General Gait Details: Fluctuating MIN A-CGA. Increased time to perform turns. Pt reports his legs feel weak, primary limiting factor in ambulation/activity tolerance. HR MAX up to 140bpm following ambulation. O2 sats stable throughout 97-100%. Pt politely deferred additional ambulation bout, but agreeable to LE ther ex.          Balance Overall balance assessment: Needs assistance Sitting-balance support: No upper extremity supported, Feet supported Sitting balance-Leahy Scale: Good     Standing balance support: Reliant on assistive device for balance Standing balance-Leahy Scale: Fair                  Cognition Arousal: Alert Behavior During Therapy: WFL for tasks assessed/performed Overall Cognitive Status: Within Functional Limits for tasks assessed              Exercises General Exercises - Lower Extremity Long Arc Quad: AROM, Strengthening, Right, Left, 10 reps, Seated Hip Flexion/Marching: AROM, Strengthening, Left, Right, 10 reps,  Seated Other Exercises Other Exercises: functional strengthening sit to stands from EOB x5    General Comments        Pertinent Vitals/Pain Pain Assessment Pain Assessment: 0-10 Pain Score: 5  Pain  Location: hips/knee L>R Pain Descriptors / Indicators: Sore, Aching Pain Intervention(s): Limited activity within patient's tolerance, Monitored during session, Repositioned     PT Goals (current goals can now be found in the care plan section) Acute Rehab PT Goals Patient Stated Goal: go home PT Goal Formulation: With patient/family Time For Goal Achievement: 01/11/23 Potential to Achieve Goals: Good Progress towards PT goals: Progressing toward goals    Frequency    Min 1X/week       AM-PAC PT "6 Clicks" Mobility   Outcome Measure  Help needed turning from your back to your side while in a flat bed without using bedrails?: A Little Help needed moving from lying on your back to sitting on the side of a flat bed without using bedrails?: A Little Help needed moving to and from a bed to a chair (including a wheelchair)?: A Little Help needed standing up from a chair using your arms (e.g., wheelchair or bedside chair)?: A Lot Help needed to walk in hospital room?: A Little Help needed climbing 3-5 steps with a railing? : A Lot 6 Click Score: 16    End of Session Equipment Utilized During Treatment: Gait belt Activity Tolerance: Patient tolerated treatment well Patient left: with call bell/phone within reach;in bed;with bed alarm set (pt reports he does not like sitting up in chair due to discomfort) Nurse Communication: Mobility status PT Visit Diagnosis: Unsteadiness on feet (R26.81);Other abnormalities of gait and mobility (R26.89);Difficulty in walking, not elsewhere classified (R26.2)     Time: 4098-1191 PT Time Calculation (min) (ACUTE ONLY): 28 min  Charges:    $Therapeutic Exercise: 8-22 mins $Therapeutic Activity: 8-22 mins PT General Charges $$ ACUTE PT VISIT: 1 Visit                     Lyman Speller PT, DPT  Acute Rehabilitation Services  Office [phone]

## 2022-12-29 LAB — CBC WITH DIFFERENTIAL/PLATELET
Abs Immature Granulocytes: 0.08 10*3/uL — ABNORMAL HIGH (ref 0.00–0.07)
Basophils Absolute: 0 10*3/uL (ref 0.0–0.1)
Basophils Relative: 0 %
Eosinophils Absolute: 0.1 10*3/uL (ref 0.0–0.5)
Eosinophils Relative: 2 %
HCT: 27.4 % — ABNORMAL LOW (ref 39.0–52.0)
Hemoglobin: 8.7 g/dL — ABNORMAL LOW (ref 13.0–17.0)
Immature Granulocytes: 2 %
Lymphocytes Relative: 13 %
Lymphs Abs: 0.6 10*3/uL — ABNORMAL LOW (ref 0.7–4.0)
MCH: 29 pg (ref 26.0–34.0)
MCHC: 31.8 g/dL (ref 30.0–36.0)
MCV: 91.3 fL (ref 80.0–100.0)
Monocytes Absolute: 0.6 10*3/uL (ref 0.1–1.0)
Monocytes Relative: 12 %
Neutro Abs: 3.5 10*3/uL (ref 1.7–7.7)
Neutrophils Relative %: 71 %
Platelets: 118 10*3/uL — ABNORMAL LOW (ref 150–400)
RBC: 3 MIL/uL — ABNORMAL LOW (ref 4.22–5.81)
RDW: 18.5 % — ABNORMAL HIGH (ref 11.5–15.5)
WBC: 4.9 10*3/uL (ref 4.0–10.5)
nRBC: 0 % (ref 0.0–0.2)

## 2022-12-29 LAB — COMPREHENSIVE METABOLIC PANEL
ALT: 19 U/L (ref 0–44)
AST: 18 U/L (ref 15–41)
Albumin: 1.8 g/dL — ABNORMAL LOW (ref 3.5–5.0)
Alkaline Phosphatase: 129 U/L — ABNORMAL HIGH (ref 38–126)
Anion gap: 9 (ref 5–15)
BUN: 17 mg/dL (ref 8–23)
CO2: 21 mmol/L — ABNORMAL LOW (ref 22–32)
Calcium: 7.5 mg/dL — ABNORMAL LOW (ref 8.9–10.3)
Chloride: 109 mmol/L (ref 98–111)
Creatinine, Ser: 1.5 mg/dL — ABNORMAL HIGH (ref 0.61–1.24)
GFR, Estimated: 49 mL/min — ABNORMAL LOW (ref >60)
Glucose, Bld: 116 mg/dL — ABNORMAL HIGH (ref 70–99)
Potassium: 3.3 mmol/L — ABNORMAL LOW (ref 3.5–5.1)
Sodium: 139 mmol/L (ref 135–145)
Total Bilirubin: 1.5 mg/dL — ABNORMAL HIGH (ref 0.3–1.2)
Total Protein: 5 g/dL — ABNORMAL LOW (ref 6.5–8.1)

## 2022-12-29 LAB — PHOSPHORUS: Phosphorus: 2.4 mg/dL — ABNORMAL LOW (ref 2.5–4.6)

## 2022-12-29 LAB — GLUCOSE, CAPILLARY: Glucose-Capillary: 123 mg/dL — ABNORMAL HIGH (ref 70–99)

## 2022-12-29 LAB — MAGNESIUM: Magnesium: 2.1 mg/dL (ref 1.7–2.4)

## 2022-12-29 MED ORDER — SODIUM BICARBONATE 650 MG PO TABS: 1300.0000 mg | ORAL_TABLET | Freq: Three times a day (TID) | ORAL | Status: DC

## 2022-12-29 MED ORDER — POTASSIUM CHLORIDE CRYS ER 20 MEQ PO TBCR
20.0000 meq | EXTENDED_RELEASE_TABLET | Freq: Once | ORAL | Status: AC
  Administered 2022-12-29: 20 meq via ORAL
  Filled 2022-12-29: qty 1

## 2022-12-29 MED ORDER — POTASSIUM CHLORIDE CRYS ER 20 MEQ PO TBCR: 40.0000 meq | EXTENDED_RELEASE_TABLET | ORAL | Status: DC

## 2022-12-29 MED ORDER — ACETAMINOPHEN 500 MG PO TABS: 1000.0000 mg | ORAL_TABLET | Freq: Three times a day (TID) | ORAL | Status: AC | PRN

## 2022-12-29 MED ORDER — INSULIN ASPART 100 UNIT/ML IJ SOLN: 0.0000 [IU] | Freq: Three times a day (TID) | INTRAMUSCULAR | Status: DC

## 2022-12-29 MED ORDER — POTASSIUM CHLORIDE CRYS ER 20 MEQ PO TBCR
40.0000 meq | EXTENDED_RELEASE_TABLET | Freq: Once | ORAL | Status: AC
  Administered 2022-12-29: 40 meq via ORAL
  Filled 2022-12-29: qty 2

## 2022-12-29 MED ORDER — PANTOPRAZOLE SODIUM 40 MG PO TBEC: 40.0000 mg | DELAYED_RELEASE_TABLET | Freq: Every day | ORAL | Status: DC

## 2022-12-29 MED ORDER — FOLIC ACID 1 MG PO TABS: 1.0000 mg | ORAL_TABLET | Freq: Every day | ORAL | Status: DC

## 2022-12-29 MED ORDER — METOPROLOL TARTRATE 50 MG PO TABS: 50.0000 mg | ORAL_TABLET | Freq: Two times a day (BID) | ORAL | Status: DC

## 2022-12-29 NOTE — Progress Notes (Signed)
Called and spoke with April at [facility]. Report given and April is aware that [facility] has been called for transport at 1127.

## 2022-12-29 NOTE — TOC Transition Note (Signed)
Transition of Care [facility]) - CM/SW Discharge Note   Patient Details  Name: Scott Bass MRN: [id number] Date of Birth: 08-Sep-1950  [facility] CM/SW Contact:  Otelia Santee, LCSW Phone Number: 12/29/2022, 11:09 AM   Clinical Narrative:    Pt is to discharge to SNF at [facility]. Pt will be going to room 209. RN to call report [phone]. Confirmed discharge plans with pt's spouse. [facility] called at 11:27am for transportation. DC packet placed at RN station.    Final next level of care: Skilled Nursing Facility Barriers to Discharge: Barriers Resolved   Patient Goals and CMS Choice CMS Medicare.gov Compare Post Acute Care list provided to:: Patient Choice offered to / list presented to : Patient  Discharge Placement PASRR number recieved: 12/18/22 PASRR number recieved: 12/18/22            Patient chooses bed at: [facility] Patient to be transferred to facility by: [facility] Name of family member notified: Spouse Patient and family notified of of transfer: 12/29/22  Discharge Plan and Services Additional resources added to the After Visit Summary for   In-house Referral: Clinical Social Work              DME Arranged: N/A DME Agency: NA                  Social Determinants of Health (SDOH) Interventions SDOH Screenings   Food Insecurity: Food Insecurity Present (12/06/2022)  Housing: Low Risk  (12/06/2022)  Transportation Needs: No Transportation Needs (12/06/2022)  Utilities: Not At Risk (12/24/2022)  Financial Resource Strain: Low Risk  (10/10/2022)   Received from [facility]  Physical Activity: Insufficiently Active (12/23/2020)   Received from [facility], [facility]  Social Connections: Unknown (09/16/2021)   Received from [facility], [facility]  Stress: No Stress Concern Present (12/23/2020)   Received from [facility], [facility]  Tobacco Use: Low Risk  (12/06/2022)     Readmission Risk Interventions     12/29/2022   11:05 AM 12/19/2022   11:42 AM 12/07/2022   11:22 AM  Readmission Risk Prevention Plan  Transportation Screening Complete Complete   Medication Review (RN Care Manager) Complete Complete   PCP or Specialist appointment within 3-5 days of discharge Complete Complete   HRI or Home Care Consult Complete Complete Complete  SW Recovery Care/Counseling Consult Complete Complete Complete  Palliative Care Screening Complete Not Applicable   Skilled Nursing Facility Complete Complete

## 2022-12-29 NOTE — Discharge Summary (Signed)
Physician Discharge Summary  Scott Bass EXB:[id number] DOB: [birth year] DOA: 12/04/2022  PCP: Tracey Harries, MD  Admit date: 12/04/2022 Discharge date: 12/29/2022  Time spent: 40 minutes  Recommendations for Outpatient Follow-up:  Follow outpatient CBC/CMP  Follow with oncology outpatient as scheduled - will need transportation to cancer center from SNF - planning for cycle 2 next week Follow abnormal imaging findings outpatient Follow HR/Blood pressure outpatient Follow blood sugars outpatient  Continue dysphagia 2 diet, nectar thick liquids per SLP   Discharge Diagnoses:  Principal Problem:   Acute on chronic renal failure (HCC) Active Problems:   Multiple myeloma without remission (HCC)   Palliative care encounter   Lactic acidosis   Counseling and coordination of care   Goals of care, counseling/discussion   Acute renal failure (HCC)   Need for emotional support   DNR (do not resuscitate)   Acute delirium   Malnutrition of moderate degree   Symptomatic anemia   Discharge Condition: stable  Diet recommendation: heart healthy, diabetic - dysphagia 2, nectar thick (see below) - Ok for thin water in between meals, 30 min after eating and after oral care.   Filed Weights   12/25/22 0513 12/26/22 0412 12/29/22 0500  Weight: 70.5 kg 66.6 kg 71.2 kg    History of present illness:   Scott Bass is Scott Bass 72 y.o. male with Scott Bass history of blindness, multiple myeloma not yet on therapy, hypertension, diabetes mellitus. Patient presented secondary to Scott Bass fall and was found to have severe acute on chronic renal failure with associated anemia and hemoglobin of 3.5. Patient required ICU admission on presentation and was started on IV fluids for hydration in addition to receiving Scott Bass total of 5 units of PRBC to date. Nephrology was consulted for acute kidney injury concerning for possible complication of untreated known multiple myeloma in addition to hypovolemia/dehydration. Patient was  started empirically on antibiotics for possible infection with blood cultures significant for Staphylococcus capitis and 1 out of 4 bottles consistent with likely contaminant. Renal function continued to improve during admission. Patient started on chemotherapy for multiple myeloma while inpatient.   He's improved at this time.  Stable for discharge to SNF with plan for outpatient follow up.  See below for additional details  Hospital Course:  Assessment and Plan:  Acute anemia Multiple Myeloma Diffuse pathologic Fractures Likely secondary to multiple myeloma. Patient received [name] total of 5 units of PRBC via transfusion this admission to date. Hemoglobin drifted down. Oncology recommendations for blood transfusion for hemoglobin < 7.5 g/dL Medical oncology consulted and started Velcade and Decadron while inpatient.  Will need follow up per oncology, sounds like they're planning for cycle 2 of his treatment next week.   Sinus Tachycardia Improved today Continue metoprolol 50 mg BID at discharge (apparently was on 75 mg BID prior to admission)  AKI on CKD stage IV Baseline creatinine of about 1.5-1.7. Creatinine of 11.76 on admission requiring ICU admission. Patient managed on IV fluids. Nephrology consulted with recommendation to defer hemodialysis secondary to patient's poor candidacy.  Improved overall back to baseline   Cough CXR with underinflation, no pulm edema No complaints today   Pancytopenia Relatively stable - improving counts recently   Hyperkalemia resolved   Hypokalemia Replace and follow   Hypomagnesemia Supplementation given.   Hypophosphatemia Replace and follow    Hyponatremia Resolved.   Hypernatremia Resolved.   Primary hypertension Continue metoprolol on discharge   Metabolic acidosis Secondary to AKI. Improved.   Dysphagia SLP recommendations: PO  Diet Recommendation: Dysphagia 2 (Finely chopped);Mildly thick liquids (Level 2, nectar thick).   Ok for thin water in between meals, 30 min after eating and after oral care.  Liquid Administration via: Cup;Straw Medication Administration: Crushed with puree Supervision: Full assist for feeding;Full supervision/cueing for swallowing strategies Swallowing strategies  : Minimize environmental distractions;Slow rate;Small bites/sips;effortful swallow;Clear throat intermittently;Multiple dry swallows after each bite/sip Postural changes: Position pt fully upright for meals   T4 vertebral body mass Noted on CT imaging with recommendation for MRI C-/T-spine w/wo. MRI T/C spine limited by absence of IV contrast (patient declined to complete exam) - diffusely decreased T1 signal and somewhat heterogenously increased T2 hyperintense signal, c/w patient's known multiple myeloma.  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  No high grade spinal canal stenosis in the thoracic spine.  Mild L foraminal narrowing at T4-5 and T9-10. Follow outpatient with PCP or oncology   Acute metabolic encephalopathy Secondary to uremia. Resolved..   Constipation Resolved.   Hypothyroidism -Continue Synthroid   Glaucoma -Continue Xalatan and Alphagan   Folic acid deficiency -Continue folic acid   Moderate malnutrition Dietitian recommendation (8/19): Increase Ensure Plus High Protein po TID, each supplement provides 350 kcal and 20 grams of protein.  Magic cup BID with meals, each supplement provides 290 kcal and 9 grams of protein Mighty Shake BID with meals, each supplement provides 330 kcals and 9 grams of protein Encourage intake at all meals and of supplements as tolerated.   Hypoalbuminemia Noted. Related to poor nutrition.      Procedures: none   Consultations: Oncology PCCM Palliative Psychiatry   Discharge Exam: Vitals:   12/28/22 1921 12/29/22 0458  BP: 108/67 130/76  Pulse: 99 95  Resp: 18 17  Temp: 97.9 F (36.6 C)  98.3 F (36.8 C)  SpO2: 98% 100%   No complaints Discussed with wife  General: No acute distress. Cardiovascular: RRR Lungs: unlabored Abdomen: Soft, nontender, nondistended with normal active bowel sounds. No masses. No hepatosplenomegaly. Neurological: Alert. Moves all extremities 4. Cranial nerves II through XII grossly intact. Extremities: No clubbing or cyanosis. No edema.  Discharge Instructions   Discharge Instructions     Call MD for:  difficulty breathing, headache or visual disturbances   Complete by: As directed    Call MD for:  extreme fatigue   Complete by: As directed    Call MD for:  hives   Complete by: As directed    Call MD for:  persistant dizziness or light-headedness   Complete by: As directed    Call MD for:  persistant nausea and vomiting   Complete by: As directed    Call MD for:  redness, tenderness, or signs of infection (pain, swelling, redness, odor or green/yellow discharge around incision site)   Complete by: As directed    Call MD for:  severe uncontrolled pain   Complete by: As directed    Call MD for:  temperature >100.4   Complete by: As directed    Diet - low sodium heart healthy   Complete by: As directed    Discharge instructions   Complete by: As directed    You were seen for acute kidney injury and severe anemia.  You were transfused red blood cells and given IV fluids.    You were treated for multiple myeloma while inpatient.   You've gradually improved.  You'll need to follow up as an outpatient with Dr. Candise Che for continued  myeloma treatment.  The skilled nursing facility should help arrange this transportation.  We'll discharge you home with some adjustments to your home medicines.  Your metoprolol has been decreased to 50 mg twice Donne Robillard day.  You had fast heart rates at times here.  This should be followed outpatient and worked up and followed additionally as needed.   We'll send you home with insulin for your diabetes.  Your  outpatient providers should adjust this regimen as needed.  You'll need to continue Maiana Hennigan dysphagia diet with nectar thick liquids (ok for thin water in between meals, 30 minutes after eating and after oral care).    You had MRI of your spine which was limited due to motion.  Follow these results with your PCP and/or Dr. Candise Che outpatient.   Increase activity slowly   Complete by: As directed    NURSING COMMUNICATION 2   Complete by: As directed    Hypersensitivity GRADE 1: Transient flushing or rash, or drug fever < 100.4 F. Hold infusion and contact MD. Hypersensitivity  GRADE 2: Rash, flushing, urticaria, dyspnea, or drug fever = or > 100.4. Hold infusion and contact MD. Give medications as ordered. Hypersensitivity  GRADE 3: symptomatic bronchospasm, with or without urticaria, parenteral medication management indicated, allergy-related edema/angioedema, or hypotension. Hold infusion and contact MD. Give medications as ordered. Hypersensitivity  GRADE 4: Anaphylaxis. Hold infusion and contact MD. Give medications as ordered.   NURSING COMMUNICATION 5   Complete by: As directed    Confirm patient took oral cyclophosphamide at home prior to infusion appointment for cycles 1-8. If cyclophosphamide was not taken, please notify provider.   TREATMENT CONDITIONS   Complete by: As directed    Patient should have CBC & CMP within 7 days prior to chemotherapy administration. NOTIFY MD IF: ANC < 1500, Hemoglobin < 8, PLT < 100,000,  Total Bili > 1.5, Creatinine > 1.5, ALT or  AST > 80 or if patient has unstable vital signs: Temperature >= 100.98F, SBP > 180 or < 90, RR > 30 or HR > 100.      Allergies as of 12/29/2022       Reactions   Hctz [hydrochlorothiazide] Other (See Comments)   Pancreatitis         Medication List     STOP taking these medications    amLODipine 5 MG tablet Commonly known as: NORVASC   aspirin 81 MG tablet   empagliflozin 25 MG Tabs tablet Commonly known as:  Jardiance   glimepiride 1 MG tablet Commonly known as: AMARYL   hydrALAZINE 50 MG tablet Commonly known as: APRESOLINE   losartan 100 MG tablet Commonly known as: COZAAR   metFORMIN 1000 MG tablet Commonly known as: GLUCOPHAGE   ondansetron 8 MG tablet Commonly known as: ZOFRAN   oxyCODONE 5 MG immediate release tablet Commonly known as: Oxy IR/ROXICODONE   sevelamer 800 MG tablet Commonly known as: RENAGEL       TAKE these medications    acetaminophen 500 MG tablet Commonly known as: TYLENOL Take 2 tablets (1,000 mg total) by mouth every 8 (eight) hours as needed for moderate pain. What changed: when to take this   brimonidine 0.2 % ophthalmic solution Commonly known as: ALPHAGAN Place 1 drop into both eyes 3 (three) times daily.   ferrous sulfate 325 (65 FE) MG tablet Take 325 mg by mouth every other day.   folic acid 1 MG tablet Commonly known as: FOLVITE Take 1 tablet (1 mg total) by mouth  daily. Start taking on: December 30, 2022   insulin aspart 100 UNIT/ML injection Commonly known as: novoLOG Inject 0-6 Units into the skin 3 (three) times daily with meals. CBG < 70: treat low blood sugar CBG 70 - 120: 0 units CBG 121 - 150: 0 units CBG 151 - 200: 1 unit CBG 201-250: 2 units CBG 251-300: 3 units CBG 301-350: 4 units CBG 351-400: 5 units CBG > 400: Give 6 units and call MD   latanoprost 0.005 % ophthalmic solution Commonly known as: XALATAN Place 1 drop into both eyes every evening.   levothyroxine 150 MCG tablet Commonly known as: SYNTHROID Take 150 mcg by mouth daily.   Magnesium Oxide 400 MG Caps Take 400 mg by mouth daily.   metoprolol tartrate 50 MG tablet Commonly known as: LOPRESSOR Take 1 tablet (50 mg total) by mouth 2 (two) times daily. What changed:  medication strength how much to take   pantoprazole 40 MG tablet Commonly known as: PROTONIX Take 1 tablet (40 mg total) by mouth daily.   polyethylene glycol 17 g  packet Commonly known as: MiraLax Take 17 g by mouth daily.   rosuvastatin 10 MG tablet Commonly known as: CRESTOR Take 10 mg by mouth every evening.   sodium bicarbonate 650 MG tablet Take 2 tablets (1,300 mg total) by mouth 3 (three) times daily.   tamsulosin 0.4 MG Caps capsule Commonly known as: FLOMAX Take 0.4 mg by mouth daily.   Vitamin B-12 2500 MCG Subl Place 2,500 mcg under the tongue daily.       Allergies  Allergen Reactions   Hctz [Hydrochlorothiazide] Other (See Comments)    Pancreatitis       The results of significant diagnostics from this hospitalization (including imaging, microbiology, ancillary and laboratory) are listed below for reference.    Significant Diagnostic Studies: DG CHEST PORT 1 VIEW  Result Date: 12/27/2022 CLINICAL DATA:  Cough.  Blood transfusion earlier today EXAM: PORTABLE CHEST 1 VIEW COMPARISON:  X-ray 12/04/2022 and CT FINDINGS: Portable semi upright view of the chest demonstrates underinflation. Calcified aorta. Normal cardiopericardial silhouette. No pneumothorax, effusion or consolidation. No edema. Degenerative changes seen. The lytic lesions identified by CT are only partially seen on the current examination. Please correlate for known lung mass and destructive spine process. IMPRESSION: Underinflation.  No pulmonary edema. Please correlate with prior workup for evaluation of known neoplasm including lung lesion and bone lesions Electronically Signed   By: Karen Kays M.D.   On: 12/27/2022 17:43   MR CERVICAL SPINE WO CONTRAST  Result Date: 12/27/2022 CLINICAL DATA:  Metastatic disease evaluation EXAM: MRI CERVICAL AND THORACIC SPINE WITHOUT CONTRAST TECHNIQUE: Multiplanar and multiecho pulse sequences of the cervical spine, to include the craniocervical junction and cervicothoracic junction, and the thoracic spine, were obtained without intravenous contrast. COMPARISON:  12/04/2022 CT cervical spine and CT chest FINDINGS:  Evaluation is somewhat limited by the absence of intravenous contrast, which was not given as the patient declined to complete the exam. MRI CERVICAL SPINE FINDINGS Alignment: No significant listhesis. Vertebrae: Diffusely decreased T1 signal and somewhat heterogeneously increased T2 hyperintense signal, consistent with the patient's known multiple myeloma. No evidence of pathologic fracture. No evidence of discitis. Cord: Normal signal and morphology. Posterior Fossa, vertebral arteries, paraspinal tissues: No acute finding. Disc levels: C2-C3: Minimal disc bulge. No spinal canal stenosis or neural foraminal narrowing. C3-C4: No significant disc bulge. No spinal canal stenosis or neural foraminal narrowing. C4-C5: No significant disc bulge. No spinal canal  stenosis or neuroforaminal narrowing. C5-C6: No significant disc bulge. No spinal canal stenosis or neuroforaminal narrowing. C6-C7: Central and right foraminal disc protrusions. Ligamentum flavum hypertrophy. Moderate spinal canal stenosis. Mild left and moderate to severe right neural foraminal narrowing. C7-T1: No significant disc bulge. No spinal canal stenosis or neuroforaminal narrowing. MRI THORACIC SPINE FINDINGS Alignment: S-shaped curvature of the thoracolumbar spine. No listhesis. Vertebrae: Diffusely decreased T1 signal and somewhat heterogeneously increased T2 hyperintense signal, consistent with the patient's known multiple myeloma. No evidence of pathologic fracture. No evidence of discitis. Cord:  Normal signal and morphology Paraspinal and other soft tissues: Small left pleural effusion. Disc levels: No high-grade spinal canal stenosis. Mild left neural foraminal narrowing at T4-T5 and T9-T10. IMPRESSION: 1. Evaluation is somewhat limited by the absence of intravenous contrast, which was not given as the patient declined to complete the exam. Within this limitation, there is diffusely decreased T1 signal and somewhat heterogeneously increased T2  hyperintense signal, consistent with the patient's known multiple myeloma. No evidence of pathologic fracture. 2. C6-C7 moderate spinal canal stenosis with mild left and moderate to severe right neural foraminal narrowing. 3. No high-grade spinal canal stenosis in the thoracic spine. Mild left neural foraminal narrowing at T4-T5 and T9-T10. Electronically Signed   By: Wiliam Ke M.D.   On: 12/27/2022 03:46   MR THORACIC SPINE WO CONTRAST  Result Date: 12/27/2022 CLINICAL DATA:  Metastatic disease evaluation EXAM: MRI CERVICAL AND THORACIC SPINE WITHOUT CONTRAST TECHNIQUE: Multiplanar and multiecho pulse sequences of the cervical spine, to include the craniocervical junction and cervicothoracic junction, and the thoracic spine, were obtained without intravenous contrast. COMPARISON:  12/04/2022 CT cervical spine and CT chest FINDINGS: Evaluation is somewhat limited by the absence of intravenous contrast, which was not given as the patient declined to complete the exam. MRI CERVICAL SPINE FINDINGS Alignment: No significant listhesis. Vertebrae: Diffusely decreased T1 signal and somewhat heterogeneously increased T2 hyperintense signal, consistent with the patient's known multiple myeloma. No evidence of pathologic fracture. No evidence of discitis. Cord: Normal signal and morphology. Posterior Fossa, vertebral arteries, paraspinal tissues: No acute finding. Disc levels: C2-C3: Minimal disc bulge. No spinal canal stenosis or neural foraminal narrowing. C3-C4: No significant disc bulge. No spinal canal stenosis or neural foraminal narrowing. C4-C5: No significant disc bulge. No spinal canal stenosis or neuroforaminal narrowing. C5-C6: No significant disc bulge. No spinal canal stenosis or neuroforaminal narrowing. C6-C7: Central and right foraminal disc protrusions. Ligamentum flavum hypertrophy. Moderate spinal canal stenosis. Mild left and moderate to severe right neural foraminal narrowing. C7-T1: No  significant disc bulge. No spinal canal stenosis or neuroforaminal narrowing. MRI THORACIC SPINE FINDINGS Alignment: S-shaped curvature of the thoracolumbar spine. No listhesis. Vertebrae: Diffusely decreased T1 signal and somewhat heterogeneously increased T2 hyperintense signal, consistent with the patient's known multiple myeloma. No evidence of pathologic fracture. No evidence of discitis. Cord:  Normal signal and morphology Paraspinal and other soft tissues: Small left pleural effusion. Disc levels: No high-grade spinal canal stenosis. Mild left neural foraminal narrowing at T4-T5 and T9-T10. IMPRESSION: 1. Evaluation is somewhat limited by the absence of intravenous contrast, which was not given as the patient declined to complete the exam. Within this limitation, there is diffusely decreased T1 signal and somewhat heterogeneously increased T2 hyperintense signal, consistent with the patient's known multiple myeloma. No evidence of pathologic fracture. 2. C6-C7 moderate spinal canal stenosis with mild left and moderate to severe right neural foraminal narrowing. 3. No high-grade spinal canal stenosis in the thoracic spine.  Mild left neural foraminal narrowing at T4-T5 and T9-T10. Electronically Signed   By: Wiliam Ke M.D.   On: 12/27/2022 03:46   DG Abd 1 View  Result Date: 12/19/2022 CLINICAL DATA:  Abdominal distention. EXAM: ABDOMEN - 1 VIEW COMPARISON:  12/18/2022 FINDINGS: Gas and stool throughout the colon. Scattered gas-filled small bowel. No small or large bowel distention. No radiopaque stones. Prominent degenerative changes in the spine and hips. Multiple lucent lesions demonstrated throughout the spine and pelvis consistent with multiple myeloma. Lung bases are clear. IMPRESSION: Nonobstructive bowel gas pattern with scattered stool filled colon. Electronically Signed   By: Burman Nieves M.D.   On: 12/19/2022 20:15   DG Abd 1 View  Result Date: 12/18/2022 CLINICAL DATA:  Abdomen  discomfort EXAM: ABDOMEN - 1 VIEW COMPARISON:  02/12/2013, CT 12/04/2022 FINDINGS: Interval mild air distension of small and large bowel with moderate feces in the right colon. Numerous lucent lesions in the pelvis corresponding to history of myeloma. IMPRESSION: Interval mild air distension of small and large bowel with moderate feces in the right colon. Question mild ileus, though consider radiographic follow-up to exclude developing bowel obstruction Electronically Signed   By: Jasmine Pang M.D.   On: 12/18/2022 19:42   CT Chest Wo Contrast  Result Date: 12/04/2022 CLINICAL DATA:  Blunt chest trauma. EXAM: CT CHEST, ABDOMEN AND PELVIS WITHOUT CONTRAST TECHNIQUE: Multidetector CT imaging of the chest, abdomen and pelvis was performed following the standard protocol without IV contrast. RADIATION DOSE REDUCTION: This exam was performed according to the departmental dose-optimization program which includes automated exposure control, adjustment of the mA and/or kV according to patient size and/or use of iterative reconstruction technique. COMPARISON:  Chest CT dated 09/26/2022. FINDINGS: CT CHEST FINDINGS Cardiovascular: No thoracic aortic aneurysm. No pericardial effusion. Mediastinum/Nodes: No mass or enlarged lymph nodes within the mediastinum. No hemorrhage or edema is seen within the mediastinum. Esophagus is unremarkable. Trachea is unremarkable. Lungs/Pleura: Mild dependent atelectasis bilaterally. Lungs appear otherwise clear. No pleural effusion or hemothorax. No pneumothorax. Musculoskeletal: Innumerable lytic lesions throughout the thoracic spine and bilateral ribs, corresponding to patient's known multiple myeloma. Associated large expansile lesion within the anterior second RIGHT rib. Multiple associated pathologic rib fractures bilaterally, of uncertain age but likely chronic. Displaced fracture of the lower RIGHT scapula, also likely pathologic related to underlying lytic lesions (multiple  myeloma), of uncertain age. No acute appearing fracture or subluxation within the thoracic spine. Large expansile mass involving the T4 vertebral body with soft tissue component extending into the central canal and LEFT neural foramen, with almost certain mass effect on the thoracic cord and exiting nerve root. CT ABDOMEN PELVIS FINDINGS Hepatobiliary: No focal liver abnormality is seen. Gallbladder is unremarkable. No perihepatic hematoma. No bile duct dilatation is seen. Pancreas: Partially infiltrated with fat but otherwise unremarkable. Spleen: No splenic injury or perisplenic hematoma. Adrenals/Urinary Tract: Adrenal glands appear normal. No adrenal hemorrhage. No renal injury identified. No renal stone or hydronephrosis. No ureteral or bladder calculi are identified. Bladder is unremarkable. Stomach/Bowel: No dilated large or small bowel loops. No evidence of bowel wall inflammation or bowel wall injury. Appendix is normal. Stomach is unremarkable. Vascular/Lymphatic: No abdominal aortic aneurysm. No periaortic hemorrhage. No enlarged lymph nodes are seen in the abdomen or pelvis. Reproductive: Moderate prostate gland enlargement with some associated mass effect on the bladder base. Other: No free fluid or hemorrhage is seen within the abdomen or pelvis. No free intraperitoneal air. Musculoskeletal: Innumerable lytic lesions throughout the lumbar spine  and osseous pelvis, corresponding to patient's known multiple myeloma. Questionable mild compression deformity of the L4 vertebral body, likely pathologic, nonacute and related to the underlying lytic changes of patient's multiple myeloma. No acute-appearing osseous abnormality is seen within the abdomen or pelvis. IMPRESSION: 1. Innumerable lytic lesions throughout the thoracic spine, bilateral ribs, lumbar spine and osseous pelvis, corresponding to patient's known multiple myeloma. 2. Large expansile mass involving the T4 vertebral body with soft tissue  component extending into the central canal and LEFT neural foramen, with almost certain mass effect on the thoracic cord and exiting nerve root. Consider MRI of the thoracic spine without and with IV contrast to further evaluate. 3. Multiple associated pathologic rib fractures bilaterally, of uncertain age but likely chronic. 4. Displaced fracture of the lower RIGHT scapula, of uncertain age but also likely pathologic related to underlying lytic lesions (multiple myeloma). 5. Questionable mild compression deformity of the L4 vertebral body, likely pathologic, nonacute and related to the underlying lytic changes of patient's multiple myeloma. 6. No acute findings within the chest, abdomen or pelvis. No evidence of solid organ injury. No hemorrhage or edema within the mediastinum. No pleural effusion or pneumothorax. No evidence of bowel wall injury. 7. Moderate prostate gland enlargement with some associated mass effect on the bladder base. Electronically Signed   By: Bary Richard M.D.   On: 12/04/2022 08:42   CT ABDOMEN PELVIS WO CONTRAST  Result Date: 12/04/2022 CLINICAL DATA:  Blunt chest trauma. EXAM: CT CHEST, ABDOMEN AND PELVIS WITHOUT CONTRAST TECHNIQUE: Multidetector CT imaging of the chest, abdomen and pelvis was performed following the standard protocol without IV contrast. RADIATION DOSE REDUCTION: This exam was performed according to the departmental dose-optimization program which includes automated exposure control, adjustment of the mA and/or kV according to patient size and/or use of iterative reconstruction technique. COMPARISON:  Chest CT dated 09/26/2022. FINDINGS: CT CHEST FINDINGS Cardiovascular: No thoracic aortic aneurysm. No pericardial effusion. Mediastinum/Nodes: No mass or enlarged lymph nodes within the mediastinum. No hemorrhage or edema is seen within the mediastinum. Esophagus is unremarkable. Trachea is unremarkable. Lungs/Pleura: Mild dependent atelectasis bilaterally. Lungs  appear otherwise clear. No pleural effusion or hemothorax. No pneumothorax. Musculoskeletal: Innumerable lytic lesions throughout the thoracic spine and bilateral ribs, corresponding to patient's known multiple myeloma. Associated large expansile lesion within the anterior second RIGHT rib. Multiple associated pathologic rib fractures bilaterally, of uncertain age but likely chronic. Displaced fracture of the lower RIGHT scapula, also likely pathologic related to underlying lytic lesions (multiple myeloma), of uncertain age. No acute appearing fracture or subluxation within the thoracic spine. Large expansile mass involving the T4 vertebral body with soft tissue component extending into the central canal and LEFT neural foramen, with almost certain mass effect on the thoracic cord and exiting nerve root. CT ABDOMEN PELVIS FINDINGS Hepatobiliary: No focal liver abnormality is seen. Gallbladder is unremarkable. No perihepatic hematoma. No bile duct dilatation is seen. Pancreas: Partially infiltrated with fat but otherwise unremarkable. Spleen: No splenic injury or perisplenic hematoma. Adrenals/Urinary Tract: Adrenal glands appear normal. No adrenal hemorrhage. No renal injury identified. No renal stone or hydronephrosis. No ureteral or bladder calculi are identified. Bladder is unremarkable. Stomach/Bowel: No dilated large or small bowel loops. No evidence of bowel wall inflammation or bowel wall injury. Appendix is normal. Stomach is unremarkable. Vascular/Lymphatic: No abdominal aortic aneurysm. No periaortic hemorrhage. No enlarged lymph nodes are seen in the abdomen or pelvis. Reproductive: Moderate prostate gland enlargement with some associated mass effect on the bladder base.  Other: No free fluid or hemorrhage is seen within the abdomen or pelvis. No free intraperitoneal air. Musculoskeletal: Innumerable lytic lesions throughout the lumbar spine and osseous pelvis, corresponding to patient's known multiple  myeloma. Questionable mild compression deformity of the L4 vertebral body, likely pathologic, nonacute and related to the underlying lytic changes of patient's multiple myeloma. No acute-appearing osseous abnormality is seen within the abdomen or pelvis. IMPRESSION: 1. Innumerable lytic lesions throughout the thoracic spine, bilateral ribs, lumbar spine and osseous pelvis, corresponding to patient's known multiple myeloma. 2. Large expansile mass involving the T4 vertebral body with soft tissue component extending into the central canal and LEFT neural foramen, with almost certain mass effect on the thoracic cord and exiting nerve root. Consider MRI of the thoracic spine without and with IV contrast to further evaluate. 3. Multiple associated pathologic rib fractures bilaterally, of uncertain age but likely chronic. 4. Displaced fracture of the lower RIGHT scapula, of uncertain age but also likely pathologic related to underlying lytic lesions (multiple myeloma). 5. Questionable mild compression deformity of the L4 vertebral body, likely pathologic, nonacute and related to the underlying lytic changes of patient's multiple myeloma. 6. No acute findings within the chest, abdomen or pelvis. No evidence of solid organ injury. No hemorrhage or edema within the mediastinum. No pleural effusion or pneumothorax. No evidence of bowel wall injury. 7. Moderate prostate gland enlargement with some associated mass effect on the bladder base. Electronically Signed   By: Bary Richard M.D.   On: 12/04/2022 08:42   CT L-SPINE NO CHARGE  Result Date: 12/04/2022 CLINICAL DATA:  Fall. EXAM: CT LUMBAR SPINE WITHOUT CONTRAST TECHNIQUE: Multidetector CT imaging of the lumbar spine was performed without intravenous contrast administration. Multiplanar CT image reconstructions were also generated. RADIATION DOSE REDUCTION: This exam was performed according to the departmental dose-optimization program which includes automated exposure  control, adjustment of the mA and/or kV according to patient size and/or use of iterative reconstruction technique. COMPARISON:  Plain film of the lumbar spine dated 11/22/2015. FINDINGS: Segmentation: 5 lumbar type vertebrae. Alignment: Stable.  No evidence of acute vertebral body subluxation. Vertebrae: No fracture line or displaced fracture fragment is seen. Questionable mild compression of the L4 vertebral body which is likely nonacute. Innumerable lytic lesions throughout the lumbar spine, corresponding to patient's known multiple myeloma. The mild compression of the L4 vertebral body is likely related to the underlying lytic changes. Paraspinal and other soft tissues: Visualized immediate paravertebral soft tissues are unremarkable. Disc levels: Disc desiccations at each level of the lumbar spine, with associated disc space narrowings and osteophyte formation, causing moderate to severe central canal stenoses at the L2-3 through L4-5 levels and mild-to-moderate central canal stenosis at L5-S1. Various degrees of neural foramen narrowing at each level of the lumbar spine as well, with possible associated nerve root impingement. IMPRESSION: 1. No acute fracture or subluxation within the lumbar spine. 2. Innumerable lytic lesions throughout the lumbar spine, corresponding to patient's known multiple myeloma. 3. Questionable interval mild compression of the L4 vertebral body which is likely nonacute and related to the underlying lytic lesions (multiple myeloma). 4. Multilevel degenerative disc disease, causing moderate to severe central canal stenoses at the L2-3 through L4-5 levels and mild-to-moderate central canal stenosis at L5-S1, with possible associated nerve root impingement at 1 or more levels. Electronically Signed   By: Bary Richard M.D.   On: 12/04/2022 08:30   CT Head Wo Contrast  Result Date: 12/04/2022 CLINICAL DATA:  Head trauma, moderate-severe;  Neck trauma (Age >= 65y) EXAM: CT HEAD WITHOUT  CONTRAST CT CERVICAL SPINE WITHOUT CONTRAST TECHNIQUE: Multidetector CT imaging of the head and cervical spine was performed following the standard protocol without intravenous contrast. Multiplanar CT image reconstructions of the cervical spine were also generated. RADIATION DOSE REDUCTION: This exam was performed according to the departmental dose-optimization program which includes automated exposure control, adjustment of the mA and/or kV according to patient size and/or use of iterative reconstruction technique. COMPARISON:  None Available. FINDINGS: CT HEAD FINDINGS Brain: No hemorrhage. No extra-axial fluid collection. No hydrocephalus. No CT evidence of an acute cortical infarct. Mild chronic microvascular ischemic change. Vascular: No hyperdense vessel or unexpected calcification. Skull: No acute fracture. There are nonspecific scattered lucent lesions throughout the calvarium, largest which is at the vertex (series 7, image 41). Sinuses/Orbits: No middle ear or mastoid effusion. Polypoid mucosal thickening in the right sphenoid left maxillary sinus. Orbits are unremarkable. Other: None. CT CERVICAL SPINE FINDINGS Alignment: Straightening of the normal cervical lordosis. Skull base and vertebrae: There is Connie Lasater mildly displaced fracture through the right C7 transverse process (series 5, image 30). 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 There are lytic lesions throughout the cervical spine involving every cervical vertebral body. Some of the lesions extend through the posterior cortex, for example at the right posterior C4 endplate (series 7, image 35) in the left aspect of T3 (series 3, image 2). Metastatic lesions are also seen in the bilateral scapula. Soft tissues and spinal canal: There is likely severe spinal canal stenosis at the T3 level secondary to epidural extension of tumor (series 9, image 1). Disc levels:  See above Upper chest: Negative. Other: None IMPRESSION: 1. No  acute intracranial abnormality. 2. Mildly displaced fracture through the right C7 transverse process. 3. Multiple lytic lesions throughout the cervical spine, calvarium, and bilateral scapula. Findings could be seen in the setting of diffuse metastatic disease of myeloma. Some of the lesions extend through the posterior cortex, for example at the right posterior C4 endplate and left aspect of T3, with likely severe spinal canal stenosis at the T3 level secondary to epidural extension of tumor. Recommend MRI of the cervical and thoracic spine with and without contrast for further evaluation. Electronically Signed   By: Lorenza Cambridge M.D.   On: 12/04/2022 08:30   CT Cervical Spine Wo Contrast  Result Date: 12/04/2022 CLINICAL DATA:  Head trauma, moderate-severe; Neck trauma (Age >= 65y) EXAM: CT HEAD WITHOUT CONTRAST CT CERVICAL SPINE WITHOUT CONTRAST TECHNIQUE: Multidetector CT imaging of the head and cervical spine was performed following the standard protocol without intravenous contrast. Multiplanar CT image reconstructions of the cervical spine were also generated. RADIATION DOSE REDUCTION: This exam was performed according to the departmental dose-optimization program which includes automated exposure control, adjustment of the mA and/or kV according to patient size and/or use of iterative reconstruction technique. COMPARISON:  None Available. FINDINGS: CT HEAD FINDINGS Brain: No hemorrhage. No extra-axial fluid collection. No hydrocephalus. No CT evidence of an acute cortical infarct. Mild chronic microvascular ischemic change. Vascular: No hyperdense vessel or unexpected calcification. Skull: No acute fracture. There are nonspecific scattered lucent lesions throughout the calvarium, largest which is at the vertex (series 7, image 41). Sinuses/Orbits: No middle ear or mastoid effusion. Polypoid mucosal thickening in the right sphenoid left maxillary sinus. Orbits are unremarkable. Other: None. CT CERVICAL  SPINE FINDINGS Alignment: Straightening of the normal cervical lordosis. Skull base and vertebrae: There is Ercelle Winkles mildly displaced fracture through  the right C7 transverse process (series 5, image 30). 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 There are lytic lesions throughout the cervical spine involving every cervical vertebral body. Some of the lesions extend through the posterior cortex, for example at the right posterior C4 endplate (series 7, image 35) in the left aspect of T3 (series 3, image 2). Metastatic lesions are also seen in the bilateral scapula. Soft tissues and spinal canal: There is likely severe spinal canal stenosis at the T3 level secondary to epidural extension of tumor (series 9, image 1). Disc levels:  See above Upper chest: Negative. Other: None IMPRESSION: 1. No acute intracranial abnormality. 2. Mildly displaced fracture through the right C7 transverse process. 3. Multiple lytic lesions throughout the cervical spine, calvarium, and bilateral scapula. Findings could be seen in the setting of diffuse metastatic disease of myeloma. Some of the lesions extend through the posterior cortex, for example at the right posterior C4 endplate and left aspect of T3, with likely severe spinal canal stenosis at the T3 level secondary to epidural extension of tumor. Recommend MRI of the cervical and thoracic spine with and without contrast for further evaluation. Electronically Signed   By: Lorenza Cambridge M.D.   On: 12/04/2022 08:30   DG Chest Portable 1 View  Result Date: 12/04/2022 CLINICAL DATA:  Status post fall. EXAM: PORTABLE CHEST 1 VIEW COMPARISON:  PET-CT 10/06/2022 FINDINGS: Heart size and mediastinal contours are unremarkable. No pleural fluid or interstitial edema. No airspace consolidation. 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 right upper lobe lung mass is again seen measuring approximately 4.4 cm on today's study. Again seen are extensive, multifocal lytic bone  metastases which involve bilateral ribs. Age indeterminate pathologic fractures involving the lateral aspect of the right fourth rib and left posterior sixth rib noted. IMPRESSION: 1. No acute cardiopulmonary disease. 2. Right upper lobe lung mass. 3. Extensive lytic bone metastases. Age indeterminate pathologic fractures involving the lateral aspect of the right fourth rib and left posterior sixth rib. Electronically Signed   By: Signa Kell M.D.   On: 12/04/2022 06:06    Microbiology: No results found for this or any previous visit (from the past 240 hour(s)).   Labs: Basic Metabolic Panel: Recent Labs  Lab 12/23/22 0951 12/24/22 0540 12/25/22 0510 12/26/22 0718 12/26/22 1054 12/27/22 0717 12/28/22 0532 12/29/22 0509  NA  --    < > 142 140  --  140 141 139  K  --    < > 3.5 3.1*  --  3.5 3.4* 3.3*  CL  --    < > 115* 111  --  110 112* 109  CO2  --    < > 20* 20*  --  20* 21* 21*  GLUCOSE  --    < > 92 115*  --  113* 120* 116*  BUN  --    < > 29* 25*  --  19 19 17   CREATININE  --    < > 2.11* 1.69*  --  1.53* 1.56* 1.50*  CALCIUM  --    < > 7.5* 7.2*  --  7.5* 7.6* 7.5*  MG 1.5*  --  1.8  --  1.8  --  1.5* 2.1  PHOS  --   --   --   --   --   --  1.6* 2.4*   < > = values in this interval not displayed.   Liver Function Tests: Recent Labs  Lab 12/27/22 1610 12/28/22  0532 12/29/22 0509  AST 16 15 18   ALT 20 18 19   ALKPHOS 120 123 129*  BILITOT 1.5* 1.5* 1.5*  PROT 5.0* 5.0* 5.0*  ALBUMIN 1.8* 1.8* 1.8*   No results for input(s): "LIPASE", "AMYLASE" in the last 168 hours. No results for input(s): "AMMONIA" in the last 168 hours. CBC: Recent Labs  Lab 12/25/22 0510 12/26/22 0718 12/27/22 0717 12/28/22 0532 12/29/22 0509  WBC 6.7 5.9 5.3 5.8 4.9  NEUTROABS  --   --   --  4.5 3.5  HGB 8.0* 7.4* 8.7* 9.0* 8.7*  HCT 23.3* 23.1* 27.3* 27.8* 27.4*  MCV 88.6 92.4 92.2 90.8 91.3  PLT 63* 66* 76* 94* 118*   Cardiac Enzymes: No results for input(s): "CKTOTAL", "CKMB",  "CKMBINDEX", "TROPONINI" in the last 168 hours. BNP: BNP (last 3 results) No results for input(s): "BNP" in the last 8760 hours.  ProBNP (last 3 results) No results for input(s): "PROBNP" in the last 8760 hours.  CBG: Recent Labs  Lab 12/28/22 0722 12/28/22 1141 12/28/22 1642 12/28/22 2128 12/29/22 0809  GLUCAP 118* 224* 165* 259* 123*       Signed:  Lacretia Nicks MD.  [facility] 12/29/2022, 10:01 AM

## 2022-12-29 NOTE — Progress Notes (Signed)
Occupational Therapy Treatment Patient Details Name: Scott Bass MRN: [id number] DOB: April 30, 1951 Today's Date: 12/29/2022   History of present illness 72 year old male presented for fall and found confused, tachycardic and constipated with labs significant for AKI with hyperkalemia and severe metabolic acidosis and Hg 3.5. LA 6.0;  CTs 12/04/2022-innumerable lytic lesions, large expansile lesion in the anterior right second rib, multiple pathologic rib fractures, expansile T4 (T3 on CT cervical spine) mass with soft tissue component extending into the central canal and left neural foramen, C7 transverse process fracture, displaced fracture of the lower RIGHT scapula, of uncertain age. PMH: blindness, recently diagnosed multiple myeloma in May 2024 but has not started therapy (diffuse lesions), HTN, DM   OT comments  The pt was motivated to participate in the session. He was assisted with sit to stand transfers using a RW, as well as for toileting at bathroom level and hand washing in standing at the sink. He required max assist for donning his socks seated EOB. He denied having pain. He is making gradual functional progress. Continue OT plan of care. Patient will benefit from continued inpatient follow up therapy, <3 hours/day.       If plan is discharge home, recommend the following:  A little help with walking and/or transfers;Assistance with cooking/housework;Assist for transportation;Help with stairs or ramp for entrance;A little help with bathing/dressing/bathroom   Equipment Recommendations  BSC/3in1    Recommendations for Other Services      Precautions / Restrictions Precautions Precautions: Fall Precaution Comments: legally blind Restrictions Weight Bearing Restrictions: No       Mobility Bed Mobility Overal bed mobility: Needs Assistance Bed Mobility: Supine to Sit     Supine to sit: Min assist, HOB elevated, Used rails          Transfers Overall transfer level:  Needs assistance Equipment used: Rolling walker (2 wheels) Transfers: Sit to/from Stand Sit to Stand: Min assist, From elevated surface                     ADL either performed or assessed with clinical judgement   ADL Overall ADL's : Needs assistance/impaired Eating/Feeding: Set up;Sitting Eating/Feeding Details (indicate cue type and reason): based on clinical judgement Grooming: Contact guard assist Grooming Details (indicate cue type and reason): He performed hand washing in standing at sink level, requiring min verbal cues for item identification, due to his chronic vision impairment.         Upper Body Dressing : Minimal assistance;Sitting Upper Body Dressing Details (indicate cue type and reason): He required assist to donn a hospital gown around his back seated EOB. Lower Body Dressing: Maximal assistance Lower Body Dressing Details (indicate cue type and reason): He required significant assist to donn his socks seated EOB, as he was unable to flex at the hips or demonstrate the figure 4 position, in order to perform. Toilet Transfer: Minimal assistance;Rolling walker (2 wheels);Grab bars Toilet Transfer Details (indicate cue type and reason): He ambulated to & from the bathroom in his room using a RW, requiring steadying assist. He was instructed on best walker placement, as well as pushing with his B LE, in order to stand from the lower toilet. Toileting- Clothing Manipulation and Hygiene: Minimal assistance;Sit to/from stand Toileting - Clothing Manipulation Details (indicate cue type and reason): He was able to perform posterior peri-hygiene with CGA in standing after having a bowel movement; he required light assist for balance/steadying, as well as for clothing management.  Vision Baseline Vision/History: 2 Legally blind            Cognition Arousal: Alert Behavior During Therapy: WFL for tasks assessed/performed Overall Cognitive Status:  Within Functional Limits for tasks assessed                        Pertinent Vitals/ Pain       Pain Assessment Pain Assessment: No/denies pain         Frequency  Min 1X/week        Progress Toward Goals  OT Goals(current goals can now be found in the care plan section)  Progress towards OT goals: Progressing toward goals  Acute Rehab OT Goals Patient Stated Goal: to return home soon  Plan Discharge plan remains appropriate       AM-PAC OT "6 Clicks" Daily Activity     Outcome Measure   Help from another person eating meals?: A Little Help from another person taking care of personal grooming?: A Little Help from another person toileting, which includes using toliet, bedpan, or urinal?: A Little Help from another person bathing (including washing, rinsing, drying)?: A Little Help from another person to put on and taking off regular upper body clothing?: A Little Help from another person to put on and taking off regular lower body clothing?: A Lot 6 Click Score: 17    End of Session Equipment Utilized During Treatment: Gait belt;Rolling walker (2 wheels)  OT Visit Diagnosis: Unsteadiness on feet (R26.81);Muscle weakness (generalized) (M62.81)   Activity Tolerance Patient tolerated treatment well   Patient Left in chair;with call bell/phone within reach;with chair alarm set   Nurse Communication Mobility status        Time: 6073-7106 OT Time Calculation (min): 24 min  Charges: OT General Charges $OT Visit: 1 Visit OT Treatments $Self Care/Home Management : 8-22 mins $Therapeutic Activity: 8-22 mins     Reuben Likes, OTR/L 12/29/2022, 12:47 PM

## 2023-01-03 ENCOUNTER — Other Ambulatory Visit: Payer: Self-pay

## 2023-01-04 ENCOUNTER — Other Ambulatory Visit: Payer: Self-pay

## 2023-01-04 ENCOUNTER — Inpatient Hospital Stay: Payer: Medicare PPO

## 2023-01-04 ENCOUNTER — Inpatient Hospital Stay: Payer: Medicare PPO | Attending: Hematology

## 2023-01-04 ENCOUNTER — Other Ambulatory Visit: Payer: Medicare PPO

## 2023-01-04 ENCOUNTER — Inpatient Hospital Stay (HOSPITAL_BASED_OUTPATIENT_CLINIC_OR_DEPARTMENT_OTHER): Payer: Medicare PPO | Admitting: Hematology

## 2023-01-04 VITALS — BP 103/66 | HR 87 | Temp 97.9°F | Resp 16 | Wt 150.7 lb

## 2023-01-04 VITALS — BP 115/70 | HR 82 | Temp 97.9°F | Resp 16

## 2023-01-04 DIAGNOSIS — C9000 Multiple myeloma not having achieved remission: Secondary | ICD-10-CM

## 2023-01-04 DIAGNOSIS — I429 Cardiomyopathy, unspecified: Secondary | ICD-10-CM | POA: Diagnosis not present

## 2023-01-04 DIAGNOSIS — Z7189 Other specified counseling: Secondary | ICD-10-CM

## 2023-01-04 DIAGNOSIS — Z79899 Other long term (current) drug therapy: Secondary | ICD-10-CM | POA: Diagnosis not present

## 2023-01-04 DIAGNOSIS — Z5111 Encounter for antineoplastic chemotherapy: Secondary | ICD-10-CM

## 2023-01-04 DIAGNOSIS — Z5112 Encounter for antineoplastic immunotherapy: Secondary | ICD-10-CM | POA: Diagnosis not present

## 2023-01-04 DIAGNOSIS — I10 Essential (primary) hypertension: Secondary | ICD-10-CM | POA: Diagnosis not present

## 2023-01-04 DIAGNOSIS — R008 Other abnormalities of heart beat: Secondary | ICD-10-CM | POA: Insufficient documentation

## 2023-01-04 DIAGNOSIS — E039 Hypothyroidism, unspecified: Secondary | ICD-10-CM | POA: Insufficient documentation

## 2023-01-04 DIAGNOSIS — E119 Type 2 diabetes mellitus without complications: Secondary | ICD-10-CM | POA: Insufficient documentation

## 2023-01-04 DIAGNOSIS — G4730 Sleep apnea, unspecified: Secondary | ICD-10-CM | POA: Diagnosis not present

## 2023-01-04 LAB — CBC WITH DIFFERENTIAL (CANCER CENTER ONLY)
Abs Immature Granulocytes: 0.05 10*3/uL (ref 0.00–0.07)
Basophils Absolute: 0 10*3/uL (ref 0.0–0.1)
Basophils Relative: 1 %
Eosinophils Absolute: 0 10*3/uL (ref 0.0–0.5)
Eosinophils Relative: 1 %
HCT: 27.4 % — ABNORMAL LOW (ref 39.0–52.0)
Hemoglobin: 9 g/dL — ABNORMAL LOW (ref 13.0–17.0)
Immature Granulocytes: 1 %
Lymphocytes Relative: 33 %
Lymphs Abs: 1.8 10*3/uL (ref 0.7–4.0)
MCH: 30.1 pg (ref 26.0–34.0)
MCHC: 32.8 g/dL (ref 30.0–36.0)
MCV: 91.6 fL (ref 80.0–100.0)
Monocytes Absolute: 0.7 10*3/uL (ref 0.1–1.0)
Monocytes Relative: 12 %
Neutro Abs: 2.9 10*3/uL (ref 1.7–7.7)
Neutrophils Relative %: 52 %
Platelet Count: 240 10*3/uL (ref 150–400)
RBC: 2.99 MIL/uL — ABNORMAL LOW (ref 4.22–5.81)
RDW: 17.5 % — ABNORMAL HIGH (ref 11.5–15.5)
Smear Review: NORMAL
WBC Count: 5.5 10*3/uL (ref 4.0–10.5)
nRBC: 0 % (ref 0.0–0.2)

## 2023-01-04 LAB — CMP (CANCER CENTER ONLY)
ALT: 19 U/L (ref 0–44)
AST: 22 U/L (ref 15–41)
Albumin: 2.5 g/dL — ABNORMAL LOW (ref 3.5–5.0)
Alkaline Phosphatase: 177 U/L — ABNORMAL HIGH (ref 38–126)
Anion gap: 7 (ref 5–15)
BUN: 12 mg/dL (ref 8–23)
CO2: 25 mmol/L (ref 22–32)
Calcium: 7.4 mg/dL — ABNORMAL LOW (ref 8.9–10.3)
Chloride: 106 mmol/L (ref 98–111)
Creatinine: 1.24 mg/dL (ref 0.61–1.24)
GFR, Estimated: >60 mL/min (ref >60)
Glucose, Bld: 215 mg/dL — ABNORMAL HIGH (ref 70–99)
Potassium: 3.8 mmol/L (ref 3.5–5.1)
Sodium: 138 mmol/L (ref 135–145)
Total Bilirubin: 0.9 mg/dL (ref 0.3–1.2)
Total Protein: 5.5 g/dL — ABNORMAL LOW (ref 6.5–8.1)

## 2023-01-04 MED ORDER — ONDANSETRON HCL 8 MG PO TABS: 8.0000 mg | ORAL_TABLET | Freq: Three times a day (TID) | ORAL | 0 refills | Status: DC | PRN

## 2023-01-04 MED ORDER — DARATUMUMAB-HYALURONIDASE-FIHJ 1800-30000 MG-UT/15ML ~~LOC~~ SOLN
1800.0000 mg | Freq: Once | SUBCUTANEOUS | Status: AC
  Administered 2023-01-04: 1800 mg via SUBCUTANEOUS
  Filled 2023-01-04: qty 15

## 2023-01-04 MED ORDER — ACYCLOVIR 400 MG PO TABS
400.0000 mg | ORAL_TABLET | Freq: Two times a day (BID) | ORAL | 5 refills | Status: DC
Start: 2023-01-04 — End: 2023-01-04

## 2023-01-04 MED ORDER — DIPHENHYDRAMINE HCL 25 MG PO CAPS
50.0000 mg | ORAL_CAPSULE | Freq: Once | ORAL | Status: AC
  Administered 2023-01-04: 50 mg via ORAL
  Filled 2023-01-04: qty 2

## 2023-01-04 MED ORDER — ACYCLOVIR 400 MG PO TABS: 400.0000 mg | ORAL_TABLET | Freq: Two times a day (BID) | ORAL | 5 refills | Status: DC

## 2023-01-04 MED ORDER — ACETAMINOPHEN 325 MG PO TABS
650.0000 mg | ORAL_TABLET | Freq: Once | ORAL | Status: AC
  Administered 2023-01-04: 650 mg via ORAL
  Filled 2023-01-04: qty 2

## 2023-01-04 MED ORDER — DEXAMETHASONE 4 MG PO TABS
20.0000 mg | ORAL_TABLET | Freq: Once | ORAL | Status: AC
  Administered 2023-01-04: 20 mg via ORAL
  Filled 2023-01-04: qty 5

## 2023-01-04 MED ORDER — FAMOTIDINE 20 MG PO TABS
20.0000 mg | ORAL_TABLET | Freq: Once | ORAL | Status: AC
  Administered 2023-01-04: 20 mg via ORAL
  Filled 2023-01-04: qty 1

## 2023-01-04 NOTE — Patient Instructions (Signed)
[facility]  Discharge Instructions: Thank you for choosing [facility] to provide your oncology and hematology care.   If you have a lab appointment with the Cancer Center, please go directly to the Cancer Center and check in at the registration area.   Wear comfortable clothing and clothing appropriate for easy access to any Portacath or PICC line.   We strive to give you quality time with your provider. You may need to reschedule your appointment if you arrive late (15 or more minutes).  Arriving late affects you and other patients whose appointments are after yours.  Also, if you miss three or more appointments without notifying the office, you may be dismissed from the clinic at the provider's discretion.      For prescription refill requests, have your pharmacy contact our office and allow 72 hours for refills to be completed.    Today you received the following chemotherapy and/or immunotherapy agents Daratumumab      To help prevent nausea and vomiting after your treatment, we encourage you to take your nausea medication as directed.  BELOW ARE SYMPTOMS THAT SHOULD BE REPORTED IMMEDIATELY: *FEVER GREATER THAN 100.4 F (38 C) OR HIGHER *CHILLS OR SWEATING *NAUSEA AND VOMITING THAT IS NOT CONTROLLED WITH YOUR NAUSEA MEDICATION *UNUSUAL SHORTNESS OF BREATH *UNUSUAL BRUISING OR BLEEDING *URINARY PROBLEMS (pain or burning when urinating, or frequent urination) *BOWEL PROBLEMS (unusual diarrhea, constipation, pain near the anus) TENDERNESS IN MOUTH AND THROAT WITH OR WITHOUT PRESENCE OF ULCERS (sore throat, sores in mouth, or a toothache) UNUSUAL RASH, SWELLING OR PAIN  UNUSUAL VAGINAL DISCHARGE OR ITCHING   Items with * indicate a potential emergency and should be followed up as soon as possible or go to the Emergency Department if any problems should occur.  Please show the CHEMOTHERAPY ALERT CARD or IMMUNOTHERAPY ALERT CARD at  check-in to the Emergency Department and triage nurse.  Should you have questions after your visit or need to cancel or reschedule your appointment, please contact [facility]  Dept: [phone]  and follow the prompts.  Office hours are 8:00 a.m. to 4:30 p.m. Monday - Friday. Please note that voicemails left after 4:00 p.m. may not be returned until the following business day.  We are closed weekends and major holidays. You have access to a nurse at all times for urgent questions. Please call the main number to the clinic Dept: [phone] and follow the prompts.   For any non-urgent questions, you may also contact your provider using MyChart. We now offer e-Visits for anyone 35 and older to request care online for non-urgent symptoms. For details visit [email or web address].   Also download the MyChart app! Go to the app store, search "MyChart", open the app, select [place], and log in with your MyChart username and password.  Daratumumab; Hyaluronidase Injection What is this medication? DARATUMUMAB; HYALURONIDASE (dar a toom ue mab; hye al ur ON i dase) treats multiple myeloma, a type of bone marrow cancer. Daratumumab works by blocking a protein that causes cancer cells to grow and multiply. This helps to slow or stop the spread of cancer cells. Hyaluronidase works by increasing the absorption of other medications in the body to help them work better. This medication may also be used treat amyloidosis, a condition that causes the buildup of a protein (amyloid) in your body. It works by reducing the buildup of this protein, which decreases symptoms. It is  a combination medication that contains a monoclonal antibody. This medicine may be used for other purposes; ask your health care provider or pharmacist if you have questions. COMMON BRAND NAME(S): DARZALEX FASPRO What should I tell my care team before I take this medication? They need to know if you  have any of these conditions: Heart disease Infection, such as chickenpox, cold sores, herpes, hepatitis B Lung or breathing disease An unusual or allergic reaction to daratumumab, hyaluronidase, other medications, foods, dyes, or preservatives Pregnant or trying to get pregnant Breast-feeding How should I use this medication? This medication is injected under the skin. It is given by your care team in a hospital or clinic setting. Talk to your care team about the use of this medication in children. Special care may be needed. Overdosage: If you think you have taken too much of this medicine contact a poison control center or emergency room at once. NOTE: This medicine is only for you. Do not share this medicine with others. What if I miss a dose? Keep appointments for follow-up doses. It is important not to miss your dose. Call your care team if you are unable to keep an appointment. What may interact with this medication? Interactions have not been studied. This list may not describe all possible interactions. Give your health care provider a list of all the medicines, herbs, non-prescription drugs, or dietary supplements you use. Also tell them if you smoke, drink alcohol, or use illegal drugs. Some items may interact with your medicine. What should I watch for while using this medication? Your condition will be monitored carefully while you are receiving this medication. This medication can cause serious allergic reactions. To reduce your risk, your care team may give you other medication to take before receiving this one. Be sure to follow the directions from your care team. This medication can affect the results of blood tests to match your blood type. These changes can last for up to 6 months after the final dose. Your care team will do blood tests to match your blood type before you start treatment. Tell all of your care team that you are being treated with this medication before  receiving a blood transfusion. This medication can affect the results of some tests used to determine treatment response; extra tests may be needed to evaluate response. Talk to your care team if you wish to become pregnant or think you are pregnant. This medication can cause serious birth defects if taken during pregnancy and for 3 months after the last dose. A reliable form of contraception is recommended while taking this medication and for 3 months after the last dose. Talk to your care team about effective forms of contraception. Do not breast-feed while taking this medication. What side effects may I notice from receiving this medication? Side effects that you should report to your care team as soon as possible: Allergic reactions--skin rash, itching, hives, swelling of the face, lips, tongue, or throat Heart rhythm changes--fast or irregular heartbeat, dizziness, feeling faint or lightheaded, chest pain, trouble breathing Infection--fever, chills, cough, sore throat, wounds that don't heal, pain or trouble when passing urine, general feeling of discomfort or being unwell Infusion reactions--chest pain, shortness of breath or trouble breathing, feeling faint or lightheaded Sudden eye pain or change in vision such as blurry vision, seeing halos around lights, vision loss Unusual bruising or bleeding Side effects that usually do not require medical attention (report to your care team if they continue or are bothersome):  Constipation Diarrhea Fatigue Nausea Pain, tingling, or numbness in the hands or feet Swelling of the ankles, hands, or feet This list may not describe all possible side effects. Call your doctor for medical advice about side effects. You may report side effects to FDA at 1-800-FDA-1088. Where should I keep my medication? This medication is given in a hospital or clinic. It will not be stored at home. NOTE: This sheet is a summary. It may not cover all possible information.  If you have questions about this medicine, talk to your doctor, pharmacist, or health care provider.  2024 [employer]/Gold Standard (2021-08-30 00:00:00)

## 2023-01-04 NOTE — Progress Notes (Signed)
HEMATOLOGY/ONCOLOGY CLINIC NOTE  Date of Service: 01/04/2023  Patient Care Team: Tracey Harries, MD as PCP - General (Family Medicine)  CHIEF COMPLAINTS/PURPOSE OF CONSULTATION:  Evaluation and management of multiple myeloma.  HISTORY OF PRESENTING ILLNESS:  Scott Bass is a wonderful 72 y.o. male who has been referred to [place] by Tracey Harries, MD for evaluation and management of multiple myeloma.  Patient was in ED for symptomatic anemia on 09/08/2022. Patient presented to the ED and was hospitalized on 09/25/2022 for acute renal failure.   Today, he presents in a wheelchair and is accompanied by his wife. He reports that he is intermittently nauseous. He does not take anything to manage nausea. He denies any acid reflux symptoms.  He does have mild abdominal pain. He has had one bowel movement this week and does endorse a cycle of constipation and diarrhea. He reports recent poor p.o intake due to loss of appetite over the last month. He reports a weight loss of 5-6 pounds. He denies any leg swelling, radiation exposure, or neuropathy.  He reports that receiving blood transfusions during his hospitalization did improve energy levels. He was given Oxycodone at the hospital which he has run out of. He currently uses Tylenol for pain management.   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 Patient's wife is also blind. He reports that he is able to manage daily activities with his wife at home.   Patient suffered a fall from a chair after it broke. He reports worsened fatigue which began one month ago. He denies any syncope or lightheadedness/dizziness.  He complains of chest wall pain as well as left hip pain in the groin and laterally. This pain has improved.   His DM2 is fairly well-controlled. He reports that he needs a Metformin refill. He has self-discontinued Jardiance 1-2 months ago as he thought that it would affect blood sugars levels. His blood sugars at home have been  fairly stable with its highest level at 145.   His wife reports that patient has frequent urination with a burning sensation. Patient's wife reports that his confusion has improved by 80%. Patient reports that he is occasionally confused. She reports that patient may need assistance with bedside commode. Patient reports that he hospital has initiated home care services. Patient reports that he does not have a nephrologist.  INTERVAL HISTORY:  Scott Bass is a 72 y.o. male here for continued evaluation and management of multiple myeloma. He was initially seen by me on 10/05/2022 and complained of intermittent nausea, mild abdominal pain, cycle of constipation/diarrhea, poor p.o. intake due to loss of appetite, weight loss, fall, worsened fatigue, chest wall pain, left hip pain, frequent urination with burning sensation, and occasional confusion.   He presented to the ED and was seen by me on 12/23/2022 and was doing well overall with no acute new symptoms.   Today, he presents in a wheelchair and is accompanied by his wife. Patient continues to be enrolled in a rehab facility. He reports that he engages in physical therapy daily. Patient endorses poor p.o. intake and swallowing issues. He received a swallow test while in the hospital, which did show thickening. Patient denies any lightheadedness, dizziness, SOB, chest pain, abdominal pain, or leg swelling.  MEDICAL HISTORY:  Past Medical History:  Diagnosis Date   Arthritis    "lower back" (12/18/2017)   Better eye: moderate vision impairment; lesser eye: blind    GERD (gastroesophageal reflux disease)    Glaucoma,  both eyes    High cholesterol    Hypertension    Hypothyroidism    Irregular heartbeat    Legally blind    "both eyes; can see some out of left eye"   OSA (obstructive sleep apnea)    Thyroid disease    Type II diabetes mellitus (HCC)    Vitamin B12 deficiency     SURGICAL HISTORY: Past Surgical History:  Procedure  Laterality Date   LEFT HEART CATH AND CORONARY ANGIOGRAPHY N/A 12/18/2017   Procedure: LEFT HEART CATH AND CORONARY ANGIOGRAPHY;  Surgeon: Elder Negus, MD;  Location: [facility];  Service: Cardiovascular;  Laterality: N/A;   NOSE SURGERY     "was crooked; they straightened it out" (12/18/2017)    SOCIAL HISTORY: Social History   Socioeconomic History   Marital status: Married    Spouse name: Not on file   Number of children: 0   Years of education: Not on file   Highest education level: Not on file  Occupational History   Occupation: retired  Tobacco Use   Smoking status: Never   Smokeless tobacco: Never  Vaping Use   Vaping status: Never Used  Substance and Sexual Activity   Alcohol use: Not Currently    Comment: occ   Drug use: Never   Sexual activity: Not Currently  Other Topics Concern   Not on file  Social History Narrative   Not on file   Social Determinants of Health   Financial Resource Strain: Low Risk  (10/10/2022)   Received from [facility], [facility]   Overall Financial Resource Strain (CARDIA)    Difficulty of Paying Living Expenses: Not very hard  Food Insecurity: Food Insecurity Present (12/06/2022)   Hunger Vital Sign    Worried About Running Out of Food in the Last Year: Often true    Ran Out of Food in the Last Year: Often true  Transportation Needs: No Transportation Needs (12/06/2022)   PRAPARE - Administrator, Civil Service (Medical): No    Lack of Transportation (Non-Medical): No  Physical Activity: Insufficiently Active (12/23/2020)   Received from [facility], [facility]   Exercise Vital Sign    Days of Exercise per Week: 2 days    Minutes of Exercise per Session: 10 min  Stress: No Stress Concern Present (12/23/2020)   Received from [facility], [facility] of Occupational Health - Occupational Stress Questionnaire    Feeling of Stress : Not at all  Social Connections: Unknown  (09/16/2021)   Received from [facility], [facility]   Social Network    Social Network: Not on file  Intimate Partner Violence: Patient Unable To Answer (12/06/2022)   Humiliation, Afraid, Rape, and Kick questionnaire    Fear of Current or Ex-Partner: Patient unable to answer    Emotionally Abused: Patient unable to answer    Physically Abused: Patient unable to answer    Sexually Abused: Patient unable to answer    FAMILY HISTORY: Family History  Problem Relation Age of Onset   Diabetes Mellitus II Mother 27   Diabetes Mellitus II Sister    Breast cancer Sister    Heart failure Sister 23    ALLERGIES:  is allergic to hctz [hydrochlorothiazide].  MEDICATIONS:  Current Outpatient Medications  Medication Sig Dispense Refill   acetaminophen (TYLENOL) 500 MG tablet Take 2 tablets (1,000 mg total) by mouth every 8 (eight) hours as needed for moderate pain.  brimonidine (ALPHAGAN) 0.2 % ophthalmic solution Place 1 drop into both eyes 3 (three) times daily.   0   Cyanocobalamin (VITAMIN B-12) 2500 MCG SUBL Place 2,500 mcg under the tongue daily. (Patient not taking: Reported on 12/06/2022)     ferrous sulfate 325 (65 FE) MG tablet Take 325 mg by mouth every other day. (Patient not taking: Reported on 12/06/2022)     folic acid (FOLVITE) 1 MG tablet Take 1 tablet (1 mg total) by mouth daily.     insulin aspart (NOVOLOG) 100 UNIT/ML injection Inject 0-6 Units into the skin 3 (three) times daily with meals. CBG < 70: treat low blood sugar CBG 70 - 120: 0 units CBG 121 - 150: 0 units CBG 151 - 200: 1 unit CBG 201-250: 2 units CBG 251-300: 3 units CBG 301-350: 4 units CBG 351-400: 5 units CBG > 400: Give 6 units and call MD     latanoprost (XALATAN) 0.005 % ophthalmic solution Place 1 drop into both eyes every evening.     levothyroxine (SYNTHROID, LEVOTHROID) 150 MCG tablet Take 150 mcg by mouth daily.  0   Magnesium Oxide 400 MG CAPS Take 400 mg by mouth daily.     metoprolol  tartrate (LOPRESSOR) 50 MG tablet Take 1 tablet (50 mg total) by mouth 2 (two) times daily.     pantoprazole (PROTONIX) 40 MG tablet Take 1 tablet (40 mg total) by mouth daily.     polyethylene glycol (MIRALAX) 17 g packet Take 17 g by mouth daily. (Patient not taking: Reported on 12/06/2022) 14 each 0   rosuvastatin (CRESTOR) 10 MG tablet Take 10 mg by mouth every evening. (Patient not taking: Reported on 12/06/2022)     sodium bicarbonate 650 MG tablet Take 2 tablets (1,300 mg total) by mouth 3 (three) times daily.     tamsulosin (FLOMAX) 0.4 MG CAPS capsule Take 0.4 mg by mouth daily. (Patient not taking: Reported on 12/06/2022)     No current facility-administered medications for this visit.    REVIEW OF SYSTEMS:    10 Point review of Systems was done is negative except as noted above.  PHYSICAL EXAMINATION: ECOG PERFORMANCE STATUS: 3 - Symptomatic, >50% confined to bed  . Vitals:   01/04/23 1106  BP: 103/66  Pulse: 87  Resp: 16  Temp: 97.9 F (36.6 C)  SpO2: 100%    Filed Weights   01/04/23 1106  Weight: 150 lb 11.2 oz (68.4 kg)    .Body mass index is 24.32 kg/m.  GENERAL:alert, in no acute distress and comfortable SKIN: no acute rashes, no significant lesions EYES: conjunctiva are pink and non-injected, sclera anicteric OROPHARYNX: MMM, no exudates, no oropharyngeal erythema or ulceration NECK: supple, no JVD LYMPH:  no palpable lymphadenopathy in the cervical, axillary or inguinal regions LUNGS: clear to auscultation b/l with normal respiratory effort HEART: regular rate & rhythm ABDOMEN:  normoactive bowel sounds , non tender, not distended. Extremity: no pedal edema PSYCH: alert & oriented x 3 with fluent speech NEURO: no focal motor/sensory deficits  LABORATORY DATA:  I have reviewed the data as listed .    Latest Ref Rng & Units 01/04/2023   10:26 AM 12/29/2022    5:09 AM 12/28/2022    5:32 AM  CBC  WBC 4.0 - 10.5 K/uL 5.5  4.9  5.8   Hemoglobin 13.0 -  17.0 g/dL 9.0  8.7  9.0   Hematocrit 39.0 - 52.0 % 27.4  27.4  27.8   Platelets 150 -  400 K/uL 240  118  94    .    Latest Ref Rng & Units 01/04/2023   10:26 AM 12/29/2022    5:09 AM 12/28/2022    5:32 AM  CMP  Glucose 70 - 99 mg/dL 425  956  387   BUN 8 - 23 mg/dL 12  17  19    Creatinine 0.61 - 1.24 mg/dL 5.64  3.32  9.51   Sodium 135 - 145 mmol/L 138  139  141   Potassium 3.5 - 5.1 mmol/L 3.8  3.3  3.4   Chloride 98 - 111 mmol/L 106  109  112   CO2 22 - 32 mmol/L 25  21  21    Calcium 8.9 - 10.3 mg/dL 7.4  7.5  7.6   Total Protein 6.5 - 8.1 g/dL 5.5  5.0  5.0   Total Bilirubin 0.3 - 1.2 mg/dL 0.9  1.5  1.5   Alkaline Phos 38 - 126 U/L 177  129  123   AST 15 - 41 U/L 22  18  15    ALT 0 - 44 U/L 19  19  18       Bone Marrow Biopsy 09/29/2022:    RADIOGRAPHIC STUDIES: I have personally reviewed the radiological images as listed and agreed with the findings in the report. DG CHEST PORT 1 VIEW  Result Date: 12/27/2022 CLINICAL DATA:  Cough.  Blood transfusion earlier today EXAM: PORTABLE CHEST 1 VIEW COMPARISON:  X-ray 12/04/2022 and CT FINDINGS: Portable semi upright view of the chest demonstrates underinflation. Calcified aorta. Normal cardiopericardial silhouette. No pneumothorax, effusion or consolidation. No edema. Degenerative changes seen. The lytic lesions identified by CT are only partially seen on the current examination. Please correlate for known lung mass and destructive spine process. IMPRESSION: Underinflation.  No pulmonary edema. Please correlate with prior workup for evaluation of known neoplasm including lung lesion and bone lesions Electronically Signed   By: Karen Kays M.D.   On: 12/27/2022 17:43   MR CERVICAL SPINE WO CONTRAST  Result Date: 12/27/2022 CLINICAL DATA:  Metastatic disease evaluation EXAM: MRI CERVICAL AND THORACIC SPINE WITHOUT CONTRAST TECHNIQUE: Multiplanar and multiecho pulse sequences of the cervical spine, to include the craniocervical junction  and cervicothoracic junction, and the thoracic spine, were obtained without intravenous contrast. COMPARISON:  12/04/2022 CT cervical spine and CT chest FINDINGS: Evaluation is somewhat limited by the absence of intravenous contrast, which was not given as the patient declined to complete the exam. MRI CERVICAL SPINE FINDINGS Alignment: No significant listhesis. Vertebrae: Diffusely decreased T1 signal and somewhat heterogeneously increased T2 hyperintense signal, consistent with the patient's known multiple myeloma. No evidence of pathologic fracture. No evidence of discitis. Cord: Normal signal and morphology. Posterior Fossa, vertebral arteries, paraspinal tissues: No acute finding. Disc levels: C2-C3: Minimal disc bulge. No spinal canal stenosis or neural foraminal narrowing. C3-C4: No significant disc bulge. No spinal canal stenosis or neural foraminal narrowing. C4-C5: No significant disc bulge. No spinal canal stenosis or neuroforaminal narrowing. C5-C6: No significant disc bulge. No spinal canal stenosis or neuroforaminal narrowing. C6-C7: Central and right foraminal disc protrusions. Ligamentum flavum hypertrophy. Moderate spinal canal stenosis. Mild left and moderate to severe right neural foraminal narrowing. C7-T1: No significant disc bulge. No spinal canal stenosis or neuroforaminal narrowing. MRI THORACIC SPINE FINDINGS Alignment: S-shaped curvature of the thoracolumbar spine. No listhesis. Vertebrae: Diffusely decreased T1 signal and somewhat heterogeneously increased T2 hyperintense signal, consistent with the patient's known multiple myeloma. No evidence of pathologic fracture.  No evidence of discitis. Cord:  Normal signal and morphology Paraspinal and other soft tissues: Small left pleural effusion. Disc levels: No high-grade spinal canal stenosis. Mild left neural foraminal narrowing at T4-T5 and T9-T10. IMPRESSION: 1. Evaluation is somewhat limited by the absence of intravenous contrast, which  was not given as the patient declined to complete the exam. Within this limitation, there is diffusely decreased T1 signal and somewhat heterogeneously increased T2 hyperintense signal, consistent with the patient's known multiple myeloma. No evidence of pathologic fracture. 2. C6-C7 moderate spinal canal stenosis with mild left and moderate to severe right neural foraminal narrowing. 3. No high-grade spinal canal stenosis in the thoracic spine. Mild left neural foraminal narrowing at T4-T5 and T9-T10. Electronically Signed   By: Wiliam Ke M.D.   On: 12/27/2022 03:46   MR THORACIC SPINE WO CONTRAST  Result Date: 12/27/2022 CLINICAL DATA:  Metastatic disease evaluation EXAM: MRI CERVICAL AND THORACIC SPINE WITHOUT CONTRAST TECHNIQUE: Multiplanar and multiecho pulse sequences of the cervical spine, to include the craniocervical junction and cervicothoracic junction, and the thoracic spine, were obtained without intravenous contrast. COMPARISON:  12/04/2022 CT cervical spine and CT chest FINDINGS: Evaluation is somewhat limited by the absence of intravenous contrast, which was not given as the patient declined to complete the exam. MRI CERVICAL SPINE FINDINGS Alignment: No significant listhesis. Vertebrae: Diffusely decreased T1 signal and somewhat heterogeneously increased T2 hyperintense signal, consistent with the patient's known multiple myeloma. No evidence of pathologic fracture. No evidence of discitis. Cord: Normal signal and morphology. Posterior Fossa, vertebral arteries, paraspinal tissues: No acute finding. Disc levels: C2-C3: Minimal disc bulge. No spinal canal stenosis or neural foraminal narrowing. C3-C4: No significant disc bulge. No spinal canal stenosis or neural foraminal narrowing. C4-C5: No significant disc bulge. No spinal canal stenosis or neuroforaminal narrowing. C5-C6: No significant disc bulge. No spinal canal stenosis or neuroforaminal narrowing. C6-C7: Central and right foraminal  disc protrusions. Ligamentum flavum hypertrophy. Moderate spinal canal stenosis. Mild left and moderate to severe right neural foraminal narrowing. C7-T1: No significant disc bulge. No spinal canal stenosis or neuroforaminal narrowing. MRI THORACIC SPINE FINDINGS Alignment: S-shaped curvature of the thoracolumbar spine. No listhesis. Vertebrae: Diffusely decreased T1 signal and somewhat heterogeneously increased T2 hyperintense signal, consistent with the patient's known multiple myeloma. No evidence of pathologic fracture. No evidence of discitis. Cord:  Normal signal and morphology Paraspinal and other soft tissues: Small left pleural effusion. Disc levels: No high-grade spinal canal stenosis. Mild left neural foraminal narrowing at T4-T5 and T9-T10. IMPRESSION: 1. Evaluation is somewhat limited by the absence of intravenous contrast, which was not given as the patient declined to complete the exam. Within this limitation, there is diffusely decreased T1 signal and somewhat heterogeneously increased T2 hyperintense signal, consistent with the patient's known multiple myeloma. No evidence of pathologic fracture. 2. C6-C7 moderate spinal canal stenosis with mild left and moderate to severe right neural foraminal narrowing. 3. No high-grade spinal canal stenosis in the thoracic spine. Mild left neural foraminal narrowing at T4-T5 and T9-T10. Electronically Signed   By: Wiliam Ke M.D.   On: 12/27/2022 03:46   DG Abd 1 View  Result Date: 12/19/2022 CLINICAL DATA:  Abdominal distention. EXAM: ABDOMEN - 1 VIEW COMPARISON:  12/18/2022 FINDINGS: Gas and stool throughout the colon. Scattered gas-filled small bowel. No small or large bowel distention. No radiopaque stones. Prominent degenerative changes in the spine and hips. Multiple lucent lesions demonstrated throughout the spine and pelvis consistent with multiple myeloma. Lung bases  are clear. IMPRESSION: Nonobstructive bowel gas pattern with scattered stool  filled colon. Electronically Signed   By: Burman Nieves M.D.   On: 12/19/2022 20:15   DG Abd 1 View  Result Date: 12/18/2022 CLINICAL DATA:  Abdomen discomfort EXAM: ABDOMEN - 1 VIEW COMPARISON:  02/12/2013, CT 12/04/2022 FINDINGS: Interval mild air distension of small and large bowel with moderate feces in the right colon. Numerous lucent lesions in the pelvis corresponding to history of myeloma. IMPRESSION: Interval mild air distension of small and large bowel with moderate feces in the right colon. Question mild ileus, though consider radiographic follow-up to exclude developing bowel obstruction Electronically Signed   By: Jasmine Pang M.D.   On: 12/18/2022 19:42    ASSESSMENT & PLAN:   72 year old legally blind gentleman with significant functional limitations and limited social support presented with who was newly diagnosed with multiple myeloma and of May 2024 and was set up for chemo counseling to start daratumumab dexamethasone Velcade and Xgeva and orders were placed and the patient was to get a PET scan but was lost to follow-up.  He had also been referred to social work at that time to help address his barriers of care. He is now admitted with progressive multiple myeloma.  1. Multiple myeloma, IgG kappa, diagnosed May 2024-untreated since patient did not follow-up now admitted with progressive myeloma with severe anemia and progressive renal failure.  CTs 12/04/2022-innumerable lytic lesions, large expansile lesion in the anterior right second rib, multiple pathologic rib fractures, expansile T4 (T3 on CT cervical spine) mass with soft tissue component extending into the central canal and left neural foramen, C7 transverse process fracture  Decadron daily x 4 starting 12/05/2022  Cycle 1 day 1 Velcade 12/07/2022  Decadron daily x 4 starting 12/12/2022  Cycle 1 day 8 Velcade 12/14/2022 2.  Acute/chronic renal failure 3.  Severe anemia 4.  Mild thrombocytopenia 5.  Diabetes 6.   Hypertension 7.  Glaucoma 8.  OSA  PLAN:  -Discussed lab results on 01/04/23 in detail with patient. CBC showed WBC normal at 5.5K, hemoglobin of stable at 9, and platelets normal at 240K. -CMP shows improved creatinine and kidney function -no indication for blood transfusion today -proceed with cycle 1 day 22 of treatment -proceed with Daratumumab as part of treatment -will send supportive medications such as nausea medication to rehab facility -there may be a role for a repeat swallow study down the line if swallowing muscles are improving.  -recommend patient to continue to engage in physical therapy to support independent functioning at home  FOLLOW-UP: Please schedule cycle 2 per integrated scheduling. Labs and PRBC transfusion as needed for hemoglobin less than 8 weekly x 6 MD visit in 2 weeks  The total time spent in the appointment was 41 minutes* .  All of the patient's questions were answered with apparent satisfaction. The patient knows to call the clinic with any problems, questions or concerns.   Wyvonnia Lora MD MS AAHIVMS [facility] Hematology/Oncology Physician [facility]  .*Total Encounter Time as defined by the Centers for Medicare and Medicaid Services includes, in addition to the face-to-face time of a patient visit (documented in the note above) non-face-to-face time: obtaining and reviewing outside history, ordering and reviewing medications, tests or procedures, care coordination (communications with other health care professionals or caregivers) and documentation in the medical record.    I,Mitra Faeizi,acting as a Neurosurgeon for Wyvonnia Lora, MD.,have documented all relevant documentation on the behalf of Wyvonnia Lora,  MD,as directed by  Wyvonnia Lora, MD while in the presence of Wyvonnia Lora, MD.  .I have reviewed the above documentation for accuracy and completeness, and I agree with the above. Johney Maine MD

## 2023-01-05 ENCOUNTER — Other Ambulatory Visit: Payer: Self-pay

## 2023-01-09 ENCOUNTER — Other Ambulatory Visit: Payer: Medicare PPO

## 2023-01-09 ENCOUNTER — Ambulatory Visit: Payer: Medicare PPO | Admitting: Hematology

## 2023-01-09 ENCOUNTER — Ambulatory Visit: Payer: Medicare PPO

## 2023-01-10 ENCOUNTER — Encounter: Payer: Self-pay | Admitting: Hematology

## 2023-01-16 ENCOUNTER — Inpatient Hospital Stay: Payer: Medicare PPO | Attending: Hematology

## 2023-01-16 ENCOUNTER — Inpatient Hospital Stay (HOSPITAL_BASED_OUTPATIENT_CLINIC_OR_DEPARTMENT_OTHER): Payer: Medicare PPO | Admitting: Physician Assistant

## 2023-01-16 ENCOUNTER — Inpatient Hospital Stay: Payer: Medicare PPO

## 2023-01-16 VITALS — BP 99/63 | HR 88 | Temp 97.9°F | Resp 16 | Ht 66.0 in | Wt 150.9 lb

## 2023-01-16 VITALS — BP 112/62 | HR 89 | Resp 18

## 2023-01-16 DIAGNOSIS — N189 Chronic kidney disease, unspecified: Secondary | ICD-10-CM | POA: Diagnosis not present

## 2023-01-16 DIAGNOSIS — R350 Frequency of micturition: Secondary | ICD-10-CM | POA: Diagnosis not present

## 2023-01-16 DIAGNOSIS — Z79624 Long term (current) use of inhibitors of nucleotide synthesis: Secondary | ICD-10-CM | POA: Diagnosis not present

## 2023-01-16 DIAGNOSIS — W07XXXA Fall from chair, initial encounter: Secondary | ICD-10-CM | POA: Diagnosis not present

## 2023-01-16 DIAGNOSIS — R630 Anorexia: Secondary | ICD-10-CM | POA: Insufficient documentation

## 2023-01-16 DIAGNOSIS — K5900 Constipation, unspecified: Secondary | ICD-10-CM | POA: Diagnosis not present

## 2023-01-16 DIAGNOSIS — Z833 Family history of diabetes mellitus: Secondary | ICD-10-CM | POA: Insufficient documentation

## 2023-01-16 DIAGNOSIS — D649 Anemia, unspecified: Secondary | ICD-10-CM | POA: Diagnosis not present

## 2023-01-16 DIAGNOSIS — I700 Atherosclerosis of aorta: Secondary | ICD-10-CM | POA: Diagnosis not present

## 2023-01-16 DIAGNOSIS — M5031 Other cervical disc degeneration,  high cervical region: Secondary | ICD-10-CM | POA: Insufficient documentation

## 2023-01-16 DIAGNOSIS — Z7189 Other specified counseling: Secondary | ICD-10-CM

## 2023-01-16 DIAGNOSIS — M50223 Other cervical disc displacement at C6-C7 level: Secondary | ICD-10-CM | POA: Insufficient documentation

## 2023-01-16 DIAGNOSIS — Z8249 Family history of ischemic heart disease and other diseases of the circulatory system: Secondary | ICD-10-CM | POA: Diagnosis not present

## 2023-01-16 DIAGNOSIS — R1030 Lower abdominal pain, unspecified: Secondary | ICD-10-CM | POA: Diagnosis not present

## 2023-01-16 DIAGNOSIS — J90 Pleural effusion, not elsewhere classified: Secondary | ICD-10-CM | POA: Insufficient documentation

## 2023-01-16 DIAGNOSIS — Z803 Family history of malignant neoplasm of breast: Secondary | ICD-10-CM | POA: Diagnosis not present

## 2023-01-16 DIAGNOSIS — E1122 Type 2 diabetes mellitus with diabetic chronic kidney disease: Secondary | ICD-10-CM | POA: Diagnosis not present

## 2023-01-16 DIAGNOSIS — K6389 Other specified diseases of intestine: Secondary | ICD-10-CM | POA: Insufficient documentation

## 2023-01-16 DIAGNOSIS — R059 Cough, unspecified: Secondary | ICD-10-CM | POA: Insufficient documentation

## 2023-01-16 DIAGNOSIS — M25552 Pain in left hip: Secondary | ICD-10-CM | POA: Diagnosis not present

## 2023-01-16 DIAGNOSIS — C9000 Multiple myeloma not having achieved remission: Secondary | ICD-10-CM | POA: Insufficient documentation

## 2023-01-16 DIAGNOSIS — Z79899 Other long term (current) drug therapy: Secondary | ICD-10-CM | POA: Diagnosis not present

## 2023-01-16 DIAGNOSIS — I129 Hypertensive chronic kidney disease with stage 1 through stage 4 chronic kidney disease, or unspecified chronic kidney disease: Secondary | ICD-10-CM | POA: Diagnosis not present

## 2023-01-16 DIAGNOSIS — H548 Legal blindness, as defined in USA: Secondary | ICD-10-CM | POA: Insufficient documentation

## 2023-01-16 DIAGNOSIS — R410 Disorientation, unspecified: Secondary | ICD-10-CM | POA: Insufficient documentation

## 2023-01-16 LAB — CMP (CANCER CENTER ONLY)
ALT: 25 U/L (ref 0–44)
AST: 25 U/L (ref 15–41)
Albumin: 3.3 g/dL — ABNORMAL LOW (ref 3.5–5.0)
Alkaline Phosphatase: 278 U/L — ABNORMAL HIGH (ref 38–126)
Anion gap: 10 (ref 5–15)
BUN: 16 mg/dL (ref 8–23)
CO2: 28 mmol/L (ref 22–32)
Calcium: 8.4 mg/dL — ABNORMAL LOW (ref 8.9–10.3)
Chloride: 101 mmol/L (ref 98–111)
Creatinine: 1.15 mg/dL (ref 0.61–1.24)
GFR, Estimated: >60 mL/min (ref >60)
Glucose, Bld: 186 mg/dL — ABNORMAL HIGH (ref 70–99)
Potassium: 3.7 mmol/L (ref 3.5–5.1)
Sodium: 139 mmol/L (ref 135–145)
Total Bilirubin: 0.9 mg/dL (ref 0.3–1.2)
Total Protein: 6.2 g/dL — ABNORMAL LOW (ref 6.5–8.1)

## 2023-01-16 LAB — CBC WITH DIFFERENTIAL (CANCER CENTER ONLY)
Abs Immature Granulocytes: 0.03 10*3/uL (ref 0.00–0.07)
Basophils Absolute: 0 10*3/uL (ref 0.0–0.1)
Basophils Relative: 0 %
Eosinophils Absolute: 0 10*3/uL (ref 0.0–0.5)
Eosinophils Relative: 0 %
HCT: 26.7 % — ABNORMAL LOW (ref 39.0–52.0)
Hemoglobin: 8.9 g/dL — ABNORMAL LOW (ref 13.0–17.0)
Immature Granulocytes: 0 %
Lymphocytes Relative: 15 %
Lymphs Abs: 1.1 10*3/uL (ref 0.7–4.0)
MCH: 30.8 pg (ref 26.0–34.0)
MCHC: 33.3 g/dL (ref 30.0–36.0)
MCV: 92.4 fL (ref 80.0–100.0)
Monocytes Absolute: 0.9 10*3/uL (ref 0.1–1.0)
Monocytes Relative: 13 %
Neutro Abs: 5.1 10*3/uL (ref 1.7–7.7)
Neutrophils Relative %: 72 %
Platelet Count: 248 10*3/uL (ref 150–400)
RBC: 2.89 MIL/uL — ABNORMAL LOW (ref 4.22–5.81)
RDW: 17.1 % — ABNORMAL HIGH (ref 11.5–15.5)
WBC Count: 7.1 10*3/uL (ref 4.0–10.5)
nRBC: 0 % (ref 0.0–0.2)

## 2023-01-16 MED ORDER — ACETAMINOPHEN 325 MG PO TABS
650.0000 mg | ORAL_TABLET | Freq: Once | ORAL | Status: AC
  Administered 2023-01-16: 650 mg via ORAL
  Filled 2023-01-16: qty 2

## 2023-01-16 MED ORDER — FAMOTIDINE 20 MG PO TABS
20.0000 mg | ORAL_TABLET | Freq: Once | ORAL | Status: AC
  Administered 2023-01-16: 20 mg via ORAL
  Filled 2023-01-16: qty 1

## 2023-01-16 MED ORDER — DIPHENHYDRAMINE HCL 25 MG PO CAPS
50.0000 mg | ORAL_CAPSULE | Freq: Once | ORAL | Status: AC
  Administered 2023-01-16: 50 mg via ORAL
  Filled 2023-01-16: qty 2

## 2023-01-16 MED ORDER — DEXAMETHASONE 4 MG PO TABS
20.0000 mg | ORAL_TABLET | Freq: Once | ORAL | Status: AC
  Administered 2023-01-16: 20 mg via ORAL
  Filled 2023-01-16: qty 5

## 2023-01-16 MED ORDER — DARATUMUMAB-HYALURONIDASE-FIHJ 1800-30000 MG-UT/15ML ~~LOC~~ SOLN
1800.0000 mg | Freq: Once | SUBCUTANEOUS | Status: AC
  Administered 2023-01-16: 1800 mg via SUBCUTANEOUS
  Filled 2023-01-16: qty 15

## 2023-01-16 MED ORDER — BORTEZOMIB CHEMO SQ INJECTION 3.5 MG (2.5MG/ML)
1.5000 mg/m2 | Freq: Once | INTRAMUSCULAR | Status: AC
  Administered 2023-01-16: 2.75 mg via SUBCUTANEOUS
  Filled 2023-01-16: qty 1.1

## 2023-01-16 NOTE — Progress Notes (Signed)
Pt observed for 1 hour post Dara-Faspro injection. Tolerated injection well. Site clear and intact. VSS. Pt had not complaints at time of discharge.

## 2023-01-16 NOTE — Progress Notes (Signed)
HEMATOLOGY/ONCOLOGY CLINIC NOTE  Date of Service: 01/16/2023  Patient Care Team: Tracey Harries, MD as PCP - General (Family Medicine)  CHIEF COMPLAINTS/PURPOSE OF CONSULTATION:  Evaluation and management of multiple myeloma.  INTERVAL HISTORY:  Scott Bass is a 72 y.o. male here for continued evaluation and management of multiple myeloma. He was last seen by Dr. Candise Che on 01/04/2023. He is accompanied by his wife for this visit.   Mr. Branton reports he is tolerating treatment without any significant toxicities. His energy and appetite are stable. He denies any injection site discomfort. He denies nausea, vomiting or bowel habit changes. He denies easy bruising or signs of bleeding. He denies fevers, chills, sweats, shortness of breath, chest pain or cough. He has no other complaints.   MEDICAL HISTORY:  Past Medical History:  Diagnosis Date   Arthritis    "lower back" (12/18/2017)   Better eye: moderate vision impairment; lesser eye: blind    GERD (gastroesophageal reflux disease)    Glaucoma, both eyes    High cholesterol    Hypertension    Hypothyroidism    Irregular heartbeat    Legally blind    "both eyes; can see some out of left eye"   OSA (obstructive sleep apnea)    Thyroid disease    Type II diabetes mellitus (HCC)    Vitamin B12 deficiency     SURGICAL HISTORY: Past Surgical History:  Procedure Laterality Date   LEFT HEART CATH AND CORONARY ANGIOGRAPHY N/A 12/18/2017   Procedure: LEFT HEART CATH AND CORONARY ANGIOGRAPHY;  Surgeon: Elder Negus, MD;  Location: [facility];  Service: Cardiovascular;  Laterality: N/A;   NOSE SURGERY     "was crooked; they straightened it out" (12/18/2017)    SOCIAL HISTORY: Social History   Socioeconomic History   Marital status: Married    Spouse name: Not on file   Number of children: 0   Years of education: Not on file   Highest education level: Not on file  Occupational History   Occupation: retired   Tobacco Use   Smoking status: Never   Smokeless tobacco: Never  Vaping Use   Vaping status: Never Used  Substance and Sexual Activity   Alcohol use: Not Currently    Comment: occ   Drug use: Never   Sexual activity: Not Currently  Other Topics Concern   Not on file  Social History Narrative   Not on file   Social Determinants of Health   Financial Resource Strain: Low Risk  (10/10/2022)   Received from [facility], [facility]   Overall Financial Resource Strain (CARDIA)    Difficulty of Paying Living Expenses: Not very hard  Food Insecurity: Food Insecurity Present (12/06/2022)   Hunger Vital Sign    Worried About Running Out of Food in the Last Year: Often true    Ran Out of Food in the Last Year: Often true  Transportation Needs: No Transportation Needs (12/06/2022)   PRAPARE - Administrator, Civil Service (Medical): No    Lack of Transportation (Non-Medical): No  Physical Activity: Insufficiently Active (12/23/2020)   Received from [facility], [facility]   Exercise Vital Sign    Days of Exercise per Week: 2 days    Minutes of Exercise per Session: 10 min  Stress: No Stress Concern Present (12/23/2020)   Received from [facility], [facility] - Occupational Stress Questionnaire    Feeling of  Stress : Not at all  Social Connections: Unknown (09/16/2021)   Received from [facility], [facility]   Social Network    Social Network: Not on file  Intimate Partner Violence: Patient Unable To Answer (12/06/2022)   Humiliation, Afraid, Rape, and Kick questionnaire    Fear of Current or Ex-Partner: Patient unable to answer    Emotionally Abused: Patient unable to answer    Physically Abused: Patient unable to answer    Sexually Abused: Patient unable to answer    FAMILY HISTORY: Family History  Problem Relation Age of Onset   Diabetes Mellitus II Mother 33   Diabetes Mellitus II Sister    Breast cancer  Sister    Heart failure Sister 23    ALLERGIES:  is allergic to hctz [hydrochlorothiazide].  MEDICATIONS:  Current Outpatient Medications  Medication Sig Dispense Refill   acetaminophen (TYLENOL) 500 MG tablet Take 2 tablets (1,000 mg total) by mouth every 8 (eight) hours as needed for moderate pain.     acyclovir (ZOVIRAX) 400 MG tablet Take 1 tablet (400 mg total) by mouth 2 (two) times daily. 60 tablet 5   brimonidine (ALPHAGAN) 0.2 % ophthalmic solution Place 1 drop into both eyes 3 (three) times daily.   0   folic acid (FOLVITE) 1 MG tablet Take 1 tablet (1 mg total) by mouth daily.     insulin aspart (NOVOLOG) 100 UNIT/ML injection Inject 0-6 Units into the skin 3 (three) times daily with meals. CBG < 70: treat low blood sugar CBG 70 - 120: 0 units CBG 121 - 150: 0 units CBG 151 - 200: 1 unit CBG 201-250: 2 units CBG 251-300: 3 units CBG 301-350: 4 units CBG 351-400: 5 units CBG > 400: Give 6 units and call MD     latanoprost (XALATAN) 0.005 % ophthalmic solution Place 1 drop into both eyes every evening.     levothyroxine (SYNTHROID, LEVOTHROID) 150 MCG tablet Take 150 mcg by mouth daily.  0   Magnesium Oxide 400 MG CAPS Take 400 mg by mouth daily.     metoprolol tartrate (LOPRESSOR) 50 MG tablet Take 1 tablet (50 mg total) by mouth 2 (two) times daily.     polyethylene glycol (MIRALAX) 17 g packet Take 17 g by mouth daily. 14 each 0   sodium bicarbonate 650 MG tablet Take 2 tablets (1,300 mg total) by mouth 3 (three) times daily.     Cyanocobalamin (VITAMIN B-12) 2500 MCG SUBL Place 2,500 mcg under the tongue daily. (Patient not taking: Reported on 12/06/2022)     ferrous sulfate 325 (65 FE) MG tablet Take 325 mg by mouth every other day. (Patient not taking: Reported on 12/06/2022)     ondansetron (ZOFRAN) 8 MG tablet Take 1 tablet (8 mg total) by mouth every 8 (eight) hours as needed for nausea or vomiting. (Patient not taking: Reported on 01/16/2023) 30 tablet 0   pantoprazole  (PROTONIX) 40 MG tablet Take 1 tablet (40 mg total) by mouth daily. (Patient not taking: Reported on 01/16/2023)     rosuvastatin (CRESTOR) 10 MG tablet Take 10 mg by mouth every evening. (Patient not taking: Reported on 12/06/2022)     tamsulosin (FLOMAX) 0.4 MG CAPS capsule Take 0.4 mg by mouth daily. (Patient not taking: Reported on 12/06/2022)     No current facility-administered medications for this visit.    REVIEW OF SYSTEMS:    10 Point review of Systems was done is negative except as noted above.  PHYSICAL  EXAMINATION: ECOG PERFORMANCE STATUS: 2 - Symptomatic, <50% confined to bed  . Vitals:   01/16/23 1315  BP: 99/63  Pulse: 88  Resp: 16  Temp: 97.9 F (36.6 C)  SpO2: 99%    Filed Weights   01/16/23 1315  Weight: 150 lb 14.4 oz (68.4 kg)    .Body mass index is 24.36 kg/m.  GENERAL:alert, in no acute distress and comfortable SKIN: no acute rashes, no significant lesions EYES: conjunctiva are pink and non-injected, sclera anicteric LUNGS: clear to auscultation b/l with normal respiratory effort HEART: regular rate & rhythm Extremity: no pedal edema PSYCH: alert & oriented x 3 with fluent speech NEURO: no focal motor/sensory deficits  LABORATORY DATA:  I have reviewed the data as listed .    Latest Ref Rng & Units 01/16/2023   12:40 PM 01/04/2023   10:26 AM 12/29/2022    5:09 AM  CBC  WBC 4.0 - 10.5 K/uL 7.1  5.5  4.9   Hemoglobin 13.0 - 17.0 g/dL 8.9  9.0  8.7   Hematocrit 39.0 - 52.0 % 26.7  27.4  27.4   Platelets 150 - 400 K/uL 248  240  118    .    Latest Ref Rng & Units 01/16/2023   12:40 PM 01/04/2023   10:26 AM 12/29/2022    5:09 AM  CMP  Glucose 70 - 99 mg/dL 161  096  045   BUN 8 - 23 mg/dL 16  12  17    Creatinine 0.61 - 1.24 mg/dL 4.09  8.11  9.14   Sodium 135 - 145 mmol/L 139  138  139   Potassium 3.5 - 5.1 mmol/L 3.7  3.8  3.3   Chloride 98 - 111 mmol/L 101  106  109   CO2 22 - 32 mmol/L 28  25  21    Calcium 8.9 - 10.3 mg/dL 8.4  7.4   7.5   Total Protein 6.5 - 8.1 g/dL 6.2  5.5  5.0   Total Bilirubin 0.3 - 1.2 mg/dL 0.9  0.9  1.5   Alkaline Phos 38 - 126 U/L 278  177  129   AST 15 - 41 U/L 25  22  18    ALT 0 - 44 U/L 25  19  19       Bone Marrow Biopsy 09/29/2022:    RADIOGRAPHIC STUDIES: I have personally reviewed the radiological images as listed and agreed with the findings in the report. DG CHEST PORT 1 VIEW  Result Date: 12/27/2022 CLINICAL DATA:  Cough.  Blood transfusion earlier today EXAM: PORTABLE CHEST 1 VIEW COMPARISON:  X-ray 12/04/2022 and CT FINDINGS: Portable semi upright view of the chest demonstrates underinflation. Calcified aorta. Normal cardiopericardial silhouette. No pneumothorax, effusion or consolidation. No edema. Degenerative changes seen. The lytic lesions identified by CT are only partially seen on the current examination. Please correlate for known lung mass and destructive spine process. IMPRESSION: Underinflation.  No pulmonary edema. Please correlate with prior workup for evaluation of known neoplasm including lung lesion and bone lesions Electronically Signed   By: Karen Kays M.D.   On: 12/27/2022 17:43   MR CERVICAL SPINE WO CONTRAST  Result Date: 12/27/2022 CLINICAL DATA:  Metastatic disease evaluation EXAM: MRI CERVICAL AND THORACIC SPINE WITHOUT CONTRAST TECHNIQUE: Multiplanar and multiecho pulse sequences of the cervical spine, to include the craniocervical junction and cervicothoracic junction, and the thoracic spine, were obtained without intravenous contrast. COMPARISON:  12/04/2022 CT cervical spine and CT chest FINDINGS: Evaluation is  somewhat limited by the absence of intravenous contrast, which was not given as the patient declined to complete the exam. MRI CERVICAL SPINE FINDINGS Alignment: No significant listhesis. Vertebrae: Diffusely decreased T1 signal and somewhat heterogeneously increased T2 hyperintense signal, consistent with the patient's known multiple myeloma. No  evidence of pathologic fracture. No evidence of discitis. Cord: Normal signal and morphology. Posterior Fossa, vertebral arteries, paraspinal tissues: No acute finding. Disc levels: C2-C3: Minimal disc bulge. No spinal canal stenosis or neural foraminal narrowing. C3-C4: No significant disc bulge. No spinal canal stenosis or neural foraminal narrowing. C4-C5: No significant disc bulge. No spinal canal stenosis or neuroforaminal narrowing. C5-C6: No significant disc bulge. No spinal canal stenosis or neuroforaminal narrowing. C6-C7: Central and right foraminal disc protrusions. Ligamentum flavum hypertrophy. Moderate spinal canal stenosis. Mild left and moderate to severe right neural foraminal narrowing. C7-T1: No significant disc bulge. No spinal canal stenosis or neuroforaminal narrowing. MRI THORACIC SPINE FINDINGS Alignment: S-shaped curvature of the thoracolumbar spine. No listhesis. Vertebrae: Diffusely decreased T1 signal and somewhat heterogeneously increased T2 hyperintense signal, consistent with the patient's known multiple myeloma. No evidence of pathologic fracture. No evidence of discitis. Cord:  Normal signal and morphology Paraspinal and other soft tissues: Small left pleural effusion. Disc levels: No high-grade spinal canal stenosis. Mild left neural foraminal narrowing at T4-T5 and T9-T10. IMPRESSION: 1. Evaluation is somewhat limited by the absence of intravenous contrast, which was not given as the patient declined to complete the exam. Within this limitation, there is diffusely decreased T1 signal and somewhat heterogeneously increased T2 hyperintense signal, consistent with the patient's known multiple myeloma. No evidence of pathologic fracture. 2. C6-C7 moderate spinal canal stenosis with mild left and moderate to severe right neural foraminal narrowing. 3. No high-grade spinal canal stenosis in the thoracic spine. Mild left neural foraminal narrowing at T4-T5 and T9-T10. Electronically  Signed   By: Wiliam Ke M.D.   On: 12/27/2022 03:46   MR THORACIC SPINE WO CONTRAST  Result Date: 12/27/2022 CLINICAL DATA:  Metastatic disease evaluation EXAM: MRI CERVICAL AND THORACIC SPINE WITHOUT CONTRAST TECHNIQUE: Multiplanar and multiecho pulse sequences of the cervical spine, to include the craniocervical junction and cervicothoracic junction, and the thoracic spine, were obtained without intravenous contrast. COMPARISON:  12/04/2022 CT cervical spine and CT chest FINDINGS: Evaluation is somewhat limited by the absence of intravenous contrast, which was not given as the patient declined to complete the exam. MRI CERVICAL SPINE FINDINGS Alignment: No significant listhesis. Vertebrae: Diffusely decreased T1 signal and somewhat heterogeneously increased T2 hyperintense signal, consistent with the patient's known multiple myeloma. No evidence of pathologic fracture. No evidence of discitis. Cord: Normal signal and morphology. Posterior Fossa, vertebral arteries, paraspinal tissues: No acute finding. Disc levels: C2-C3: Minimal disc bulge. No spinal canal stenosis or neural foraminal narrowing. C3-C4: No significant disc bulge. No spinal canal stenosis or neural foraminal narrowing. C4-C5: No significant disc bulge. No spinal canal stenosis or neuroforaminal narrowing. C5-C6: No significant disc bulge. No spinal canal stenosis or neuroforaminal narrowing. C6-C7: Central and right foraminal disc protrusions. Ligamentum flavum hypertrophy. Moderate spinal canal stenosis. Mild left and moderate to severe right neural foraminal narrowing. C7-T1: No significant disc bulge. No spinal canal stenosis or neuroforaminal narrowing. MRI THORACIC SPINE FINDINGS Alignment: S-shaped curvature of the thoracolumbar spine. No listhesis. Vertebrae: Diffusely decreased T1 signal and somewhat heterogeneously increased T2 hyperintense signal, consistent with the patient's known multiple myeloma. No evidence of pathologic  fracture. No evidence of discitis. Cord:  Normal signal  and morphology Paraspinal and other soft tissues: Small left pleural effusion. Disc levels: No high-grade spinal canal stenosis. Mild left neural foraminal narrowing at T4-T5 and T9-T10. IMPRESSION: 1. Evaluation is somewhat limited by the absence of intravenous contrast, which was not given as the patient declined to complete the exam. Within this limitation, there is diffusely decreased T1 signal and somewhat heterogeneously increased T2 hyperintense signal, consistent with the patient's known multiple myeloma. No evidence of pathologic fracture. 2. C6-C7 moderate spinal canal stenosis with mild left and moderate to severe right neural foraminal narrowing. 3. No high-grade spinal canal stenosis in the thoracic spine. Mild left neural foraminal narrowing at T4-T5 and T9-T10. Electronically Signed   By: Wiliam Ke M.D.   On: 12/27/2022 03:46   DG Abd 1 View  Result Date: 12/19/2022 CLINICAL DATA:  Abdominal distention. EXAM: ABDOMEN - 1 VIEW COMPARISON:  12/18/2022 FINDINGS: Gas and stool throughout the colon. Scattered gas-filled small bowel. No small or large bowel distention. No radiopaque stones. Prominent degenerative changes in the spine and hips. Multiple lucent lesions demonstrated throughout the spine and pelvis consistent with multiple myeloma. Lung bases are clear. IMPRESSION: Nonobstructive bowel gas pattern with scattered stool filled colon. Electronically Signed   By: Burman Nieves M.D.   On: 12/19/2022 20:15   DG Abd 1 View  Result Date: 12/18/2022 CLINICAL DATA:  Abdomen discomfort EXAM: ABDOMEN - 1 VIEW COMPARISON:  02/12/2013, CT 12/04/2022 FINDINGS: Interval mild air distension of small and large bowel with moderate feces in the right colon. Numerous lucent lesions in the pelvis corresponding to history of myeloma. IMPRESSION: Interval mild air distension of small and large bowel with moderate feces in the right colon. Question  mild ileus, though consider radiographic follow-up to exclude developing bowel obstruction Electronically Signed   By: Jasmine Pang M.D.   On: 12/18/2022 19:42    ASSESSMENT & PLAN:  DELLIS ALYEA is a 72 y.o. male who presents to the clinic for continued management of multiple myeloma  #Multiple myeloma, IgG kappa -Diagnosed May 2024-untreated since patient did not follow-up now admitted with progressive myeloma with severe anemia and progressive renal failure. -CTs 12/04/2022-innumerable lytic lesions, large expansile lesion in the anterior right second rib, multiple pathologic rib fractures, expansile T4 (T3 on CT cervical spine) mass with soft tissue component extending into the central canal and left neural foramen, C7 transverse process fracture -Decadron daily x 4 starting 12/05/2022 -Inpatient treatment included: Cycle 1 day 1 Velcade 12/07/2022 Decadron daily x 4 starting 12/12/2022 Cycle 1 day 8 Velcade 12/14/2022 Cycle 1 day 15 Velcade 12/21/2022 -Added daratumumab on Cycle 1 Day 22 on 01/04/2023 PLAN: -Due for Cycle 2, Day 1 today with Velcade/Dara/Dex.  -Labs from today were reviewed and adequate for treatment. WBC 7.1, Hgb 8.9, MCV 92.4, Plt 248K, Creatinine 1.15, LFTs normal -Proceed with treatment today without any dose modifications -RTC next week for labs, follow up before Cycle 2, Day 8.   FOLLOW-UP: Continue with weekly treatments with dara/velcade/dex per treatment plan   All of the patient's questions were answered with apparent satisfaction. The patient knows to call the clinic with any problems, questions or concerns.  I have spent a total of 30 minutes minutes of face-to-face and non-face-to-face time, preparing to see the patient, performing a medically appropriate examination, counseling and educating the patient, documenting clinical information in the electronic health record, independently interpreting results and communicating results to the patient, and care  coordination.   Georga Kaufmann PA-C Dept of  Hematology and Oncology [facility] Phone: [phone]

## 2023-01-16 NOTE — Patient Instructions (Signed)
[facility] [place]  Discharge Instructions: Thank you for choosing [facility] to provide your oncology and hematology care.   If you have a lab appointment with the [facility], please go directly to the [facility] and check in at the registration area.   Wear comfortable clothing and clothing appropriate for easy access to any Portacath or PICC line.   We strive to give you quality time with your provider. You may need to reschedule your appointment if you arrive late (15 or more minutes).  Arriving late affects you and other patients whose appointments are after yours.  Also, if you miss three or more appointments without notifying the office, you may be dismissed from the clinic at the provider's discretion.      For prescription refill requests, have your pharmacy contact our office and allow 72 hours for refills to be completed.    Today you received the following chemotherapy and/or immunotherapy agents: Velcade, Dara Faspro.       To help prevent nausea and vomiting after your treatment, we encourage you to take your nausea medication as directed.  BELOW ARE SYMPTOMS THAT SHOULD BE REPORTED IMMEDIATELY: *FEVER GREATER THAN 100.4 F (38 C) OR HIGHER *CHILLS OR SWEATING *NAUSEA AND VOMITING THAT IS NOT CONTROLLED WITH YOUR NAUSEA MEDICATION *UNUSUAL SHORTNESS OF BREATH *UNUSUAL BRUISING OR BLEEDING *URINARY PROBLEMS (pain or burning when urinating, or frequent urination) *BOWEL PROBLEMS (unusual diarrhea, constipation, pain near the anus) TENDERNESS IN MOUTH AND THROAT WITH OR WITHOUT PRESENCE OF ULCERS (sore throat, sores in mouth, or a toothache) UNUSUAL RASH, SWELLING OR PAIN  UNUSUAL VAGINAL DISCHARGE OR ITCHING   Items with * indicate a potential emergency and should be followed up as soon as possible or go to the Emergency Department if any problems should occur.  Please show the CHEMOTHERAPY ALERT CARD or IMMUNOTHERAPY ALERT  CARD at check-in to the Emergency Department and triage nurse.  Should you have questions after your visit or need to cancel or reschedule your appointment, please contact [facility]  Dept: [phone]  and follow the prompts.  Office hours are 8:00 a.m. to 4:30 p.m. Monday - Friday. Please note that voicemails left after 4:00 p.m. may not be returned until the following business day.  We are closed weekends and major holidays. You have access to a nurse at all times for urgent questions. Please call the main number to the clinic Dept: [phone] and follow the prompts.   For any non-urgent questions, you may also contact your provider using MyChart. We now offer e-Visits for anyone 21 and older to request care online for non-urgent symptoms. For details visit [email or web address].   Also download the MyChart app! Go to the app store, search "MyChart", open the app, select [facility], and log in with your MyChart username and password.

## 2023-01-23 ENCOUNTER — Inpatient Hospital Stay (HOSPITAL_BASED_OUTPATIENT_CLINIC_OR_DEPARTMENT_OTHER): Payer: Medicare PPO | Admitting: Hematology

## 2023-01-23 ENCOUNTER — Inpatient Hospital Stay: Payer: Medicare PPO

## 2023-01-23 VITALS — BP 104/62

## 2023-01-23 DIAGNOSIS — C9000 Multiple myeloma not having achieved remission: Secondary | ICD-10-CM | POA: Diagnosis not present

## 2023-01-23 DIAGNOSIS — Z7189 Other specified counseling: Secondary | ICD-10-CM

## 2023-01-23 LAB — CBC WITH DIFFERENTIAL (CANCER CENTER ONLY)
Abs Immature Granulocytes: 0.02 10*3/uL (ref 0.00–0.07)
Basophils Absolute: 0 10*3/uL (ref 0.0–0.1)
Basophils Relative: 0 %
Eosinophils Absolute: 0 10*3/uL (ref 0.0–0.5)
Eosinophils Relative: 0 %
HCT: 24.9 % — ABNORMAL LOW (ref 39.0–52.0)
Hemoglobin: 8.1 g/dL — ABNORMAL LOW (ref 13.0–17.0)
Immature Granulocytes: 0 %
Lymphocytes Relative: 15 %
Lymphs Abs: 0.8 10*3/uL (ref 0.7–4.0)
MCH: 30.1 pg (ref 26.0–34.0)
MCHC: 32.5 g/dL (ref 30.0–36.0)
MCV: 92.6 fL (ref 80.0–100.0)
Monocytes Absolute: 0.7 10*3/uL (ref 0.1–1.0)
Monocytes Relative: 13 %
Neutro Abs: 3.5 10*3/uL (ref 1.7–7.7)
Neutrophils Relative %: 72 %
Platelet Count: 165 10*3/uL (ref 150–400)
RBC: 2.69 MIL/uL — ABNORMAL LOW (ref 4.22–5.81)
RDW: 17.2 % — ABNORMAL HIGH (ref 11.5–15.5)
Smear Review: NORMAL
WBC Count: 5 10*3/uL (ref 4.0–10.5)
nRBC: 0 % (ref 0.0–0.2)

## 2023-01-23 LAB — CMP (CANCER CENTER ONLY)
ALT: 9 U/L (ref 0–44)
AST: 9 U/L — ABNORMAL LOW (ref 15–41)
Albumin: 2.8 g/dL — ABNORMAL LOW (ref 3.5–5.0)
Alkaline Phosphatase: 177 U/L — ABNORMAL HIGH (ref 38–126)
Anion gap: 8 (ref 5–15)
BUN: 14 mg/dL (ref 8–23)
CO2: 31 mmol/L (ref 22–32)
Calcium: 8 mg/dL — ABNORMAL LOW (ref 8.9–10.3)
Chloride: 98 mmol/L (ref 98–111)
Creatinine: 1 mg/dL (ref 0.61–1.24)
GFR, Estimated: >60 mL/min (ref >60)
Glucose, Bld: 130 mg/dL — ABNORMAL HIGH (ref 70–99)
Potassium: 3.3 mmol/L — ABNORMAL LOW (ref 3.5–5.1)
Sodium: 137 mmol/L (ref 135–145)
Total Bilirubin: 1.2 mg/dL (ref 0.3–1.2)
Total Protein: 5.5 g/dL — ABNORMAL LOW (ref 6.5–8.1)

## 2023-01-23 MED ORDER — FAMOTIDINE 20 MG PO TABS
20.0000 mg | ORAL_TABLET | Freq: Once | ORAL | Status: AC
  Administered 2023-01-23: 20 mg via ORAL
  Filled 2023-01-23: qty 1

## 2023-01-23 MED ORDER — DEXAMETHASONE 4 MG PO TABS
20.0000 mg | ORAL_TABLET | Freq: Once | ORAL | Status: AC
  Administered 2023-01-23: 20 mg via ORAL
  Filled 2023-01-23: qty 5

## 2023-01-23 MED ORDER — BORTEZOMIB CHEMO SQ INJECTION 3.5 MG (2.5MG/ML)
1.5000 mg/m2 | Freq: Once | INTRAMUSCULAR | Status: AC
  Administered 2023-01-23: 2.75 mg via SUBCUTANEOUS
  Filled 2023-01-23: qty 1.1

## 2023-01-23 MED ORDER — DIPHENHYDRAMINE HCL 25 MG PO CAPS
50.0000 mg | ORAL_CAPSULE | Freq: Once | ORAL | Status: AC
  Administered 2023-01-23: 50 mg via ORAL
  Filled 2023-01-23: qty 2

## 2023-01-23 MED ORDER — ACETAMINOPHEN 325 MG PO TABS
650.0000 mg | ORAL_TABLET | Freq: Once | ORAL | Status: AC
  Administered 2023-01-23: 650 mg via ORAL
  Filled 2023-01-23: qty 2

## 2023-01-23 MED ORDER — DARATUMUMAB-HYALURONIDASE-FIHJ 1800-30000 MG-UT/15ML ~~LOC~~ SOLN
1800.0000 mg | Freq: Once | SUBCUTANEOUS | Status: AC
  Administered 2023-01-23: 1800 mg via SUBCUTANEOUS
  Filled 2023-01-23: qty 15

## 2023-01-23 NOTE — Patient Instructions (Signed)
[facility] AT [facility]  Discharge Instructions: Thank you for choosing [facility] to provide your oncology and hematology care.   If you have a lab appointment with the Cancer Center, please go directly to the Cancer Center and check in at the registration area.   Wear comfortable clothing and clothing appropriate for easy access to any Portacath or PICC line.   We strive to give you quality time with your provider. You may need to reschedule your appointment if you arrive late (15 or more minutes).  Arriving late affects you and other patients whose appointments are after yours.  Also, if you miss three or more appointments without notifying the office, you may be dismissed from the clinic at the provider's discretion.      For prescription refill requests, have your pharmacy contact our office and allow 72 hours for refills to be completed.    Today you received the following chemotherapy and/or immunotherapy agents: Velcade, Dara- Faspro.       To help prevent nausea and vomiting after your treatment, we encourage you to take your nausea medication as directed.  BELOW ARE SYMPTOMS THAT SHOULD BE REPORTED IMMEDIATELY: *FEVER GREATER THAN 100.4 F (38 C) OR HIGHER *CHILLS OR SWEATING *NAUSEA AND VOMITING THAT IS NOT CONTROLLED WITH YOUR NAUSEA MEDICATION *UNUSUAL SHORTNESS OF BREATH *UNUSUAL BRUISING OR BLEEDING *URINARY PROBLEMS (pain or burning when urinating, or frequent urination) *BOWEL PROBLEMS (unusual diarrhea, constipation, pain near the anus) TENDERNESS IN MOUTH AND THROAT WITH OR WITHOUT PRESENCE OF ULCERS (sore throat, sores in mouth, or a toothache) UNUSUAL RASH, SWELLING OR PAIN  UNUSUAL VAGINAL DISCHARGE OR ITCHING   Items with * indicate a potential emergency and should be followed up as soon as possible or go to the Emergency Department if any problems should occur.  Please show the CHEMOTHERAPY ALERT CARD or IMMUNOTHERAPY ALERT  CARD at check-in to the Emergency Department and triage nurse.  Should you have questions after your visit or need to cancel or reschedule your appointment, please contact [facility]  Dept: [phone]  and follow the prompts.  Office hours are 8:00 a.m. to 4:30 p.m. Monday - Friday. Please note that voicemails left after 4:00 p.m. may not be returned until the following business day.  We are closed weekends and major holidays. You have access to a nurse at all times for urgent questions. Please call the main number to the clinic Dept: [phone] and follow the prompts.   For any non-urgent questions, you may also contact your provider using MyChart. We now offer e-Visits for anyone 64 and older to request care online for non-urgent symptoms. For details visit [email or web address].   Also download the MyChart app! Go to the app store, search "MyChart", open the app, select [facility], and log in with your MyChart username and password.

## 2023-01-23 NOTE — Progress Notes (Signed)
Patient seen by Dr. Addison Naegeli are within treatment parameters. BP 93/61  Labs reviewed: and are within treatment parameters. Dr Candise Che aware K: 3.3  Per physician team, patient is ready for treatment and there are NO modifications to the treatment plan.

## 2023-01-23 NOTE — Progress Notes (Signed)
HEMATOLOGY/ONCOLOGY CLINIC NOTE  Date of Service: 01/23/2023  Patient Care Team: Tracey Harries, MD as PCP - General (Family Medicine)  CHIEF COMPLAINTS/PURPOSE OF CONSULTATION:  Evaluation and management of multiple myeloma.  HISTORY OF PRESENTING ILLNESS:  Scott Bass is a wonderful 72 y.o. male who has been referred to [place] by Tracey Harries, MD for evaluation and management of multiple myeloma.  Patient was in ED for symptomatic anemia on 09/08/2022. Patient presented to the ED and was hospitalized on 09/25/2022 for acute renal failure.   Today, he presents in a wheelchair and is accompanied by his wife. He reports that he is intermittently nauseous. He does not take anything to manage nausea. He denies any acid reflux symptoms.  He does have mild abdominal pain. He has had one bowel movement this week and does endorse a cycle of constipation and diarrhea. He reports recent poor p.o intake due to loss of appetite over the last month. He reports a weight loss of 5-6 pounds. He denies any leg swelling, radiation exposure, or neuropathy.  He reports that receiving blood transfusions during his hospitalization did improve energy levels. He was given Oxycodone at the hospital which he has run out of. He currently uses Tylenol for pain management.   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 Patient's wife is also blind. He reports that he is able to manage daily activities with his wife at home.   Patient suffered a fall from a chair after it broke. He reports worsened fatigue which began one month ago. He denies any syncope or lightheadedness/dizziness.  He complains of chest wall pain as well as left hip pain in the groin and laterally. This pain has improved.   His DM2 is fairly well-controlled. He reports that he needs a Metformin refill. He has self-discontinued Jardiance 1-2 months ago as he thought that it would affect blood sugars levels. His blood sugars at home have been  fairly stable with its highest level at 145.   His wife reports that patient has frequent urination with a burning sensation. Patient's wife reports that his confusion has improved by 80%. Patient reports that he is occasionally confused. She reports that patient may need assistance with bedside commode. Patient reports that he hospital has initiated home care services. Patient reports that he does not have a nephrologist.  INTERVAL HISTORY:  Scott Bass is a 72 y.o. male here for continued evaluation and management of multiple myeloma. He is here fro cycle 2 day 8 of his treatment.   Patient was last seen by PA Thayil on 01/16/2023 and he was doing well overall.   Patient is accompanied by his wife and a Engineer, civil (consulting). Patient notes he has been doing well overall since our last visit. He notes he is at the rehab center until thus Thursday, 01/25/2023. He does complain of mild fatigue, mild constipation, and loss of appetite. Pt notes that he will start eating better once he gets discharged from rehab center.   Patient has tolerated his cycle 1 of his treatment well without any new or severe toxicities. He denies diarrhea, skin rashes, nausea.   He denies any recent infection issues, fever, chills, night sweats, abdominal pain, chest pain, or leg swelling. He does complain of occasional swallowing problems with his current diet.   Patient is currently eating modified diet.     MEDICAL HISTORY:  Past Medical History:  Diagnosis Date   Arthritis    "lower back" (12/18/2017)  Better eye: moderate vision impairment; lesser eye: blind    GERD (gastroesophageal reflux disease)    Glaucoma, both eyes    High cholesterol    Hypertension    Hypothyroidism    Irregular heartbeat    Legally blind    "both eyes; can see some out of left eye"   OSA (obstructive sleep apnea)    Thyroid disease    Type II diabetes mellitus (HCC)    Vitamin B12 deficiency     SURGICAL HISTORY: Past Surgical  History:  Procedure Laterality Date   LEFT HEART CATH AND CORONARY ANGIOGRAPHY N/A 12/18/2017   Procedure: LEFT HEART CATH AND CORONARY ANGIOGRAPHY;  Surgeon: Elder Negus, MD;  Location: [facility];  Service: Cardiovascular;  Laterality: N/A;   NOSE SURGERY     "was crooked; they straightened it out" (12/18/2017)    SOCIAL HISTORY: Social History   Socioeconomic History   Marital status: Married    Spouse name: Not on file   Number of children: 0   Years of education: Not on file   Highest education level: Not on file  Occupational History   Occupation: retired  Tobacco Use   Smoking status: Never   Smokeless tobacco: Never  Vaping Use   Vaping status: Never Used  Substance and Sexual Activity   Alcohol use: Not Currently    Comment: occ   Drug use: Never   Sexual activity: Not Currently  Other Topics Concern   Not on file  Social History Narrative   Not on file   Social Determinants of Health   Financial Resource Strain: Low Risk  (10/10/2022)   Received from [facility], [facility]   Overall Financial Resource Strain (CARDIA)    Difficulty of Paying Living Expenses: Not very hard  Food Insecurity: Food Insecurity Present (12/06/2022)   Hunger Vital Sign    Worried About Running Out of Food in the Last Year: Often true    Ran Out of Food in the Last Year: Often true  Transportation Needs: No Transportation Needs (12/06/2022)   PRAPARE - Administrator, Civil Service (Medical): No    Lack of Transportation (Non-Medical): No  Physical Activity: Insufficiently Active (12/23/2020)   Received from [facility], [facility]   Exercise Vital Sign    Days of Exercise per Week: 2 days    Minutes of Exercise per Session: 10 min  Stress: No Stress Concern Present (12/23/2020)   Received from [facility], [facility] - Occupational Stress Questionnaire    Feeling of Stress : Not at all  Social  Connections: Unknown (09/16/2021)   Received from [facility], [facility]   Social Network    Social Network: Not on file  Intimate Partner Violence: Patient Unable To Answer (12/06/2022)   Humiliation, Afraid, Rape, and Kick questionnaire    Fear of Current or Ex-Partner: Patient unable to answer    Emotionally Abused: Patient unable to answer    Physically Abused: Patient unable to answer    Sexually Abused: Patient unable to answer    FAMILY HISTORY: Family History  Problem Relation Age of Onset   Diabetes Mellitus II Mother 56   Diabetes Mellitus II Sister    Breast cancer Sister    Heart failure Sister 68    ALLERGIES:  is allergic to hctz [hydrochlorothiazide].  MEDICATIONS:  Current Outpatient Medications  Medication Sig Dispense Refill   acetaminophen (TYLENOL) 500 MG tablet  Take 2 tablets (1,000 mg total) by mouth every 8 (eight) hours as needed for moderate pain.     acyclovir (ZOVIRAX) 400 MG tablet Take 1 tablet (400 mg total) by mouth 2 (two) times daily. 60 tablet 5   brimonidine (ALPHAGAN) 0.2 % ophthalmic solution Place 1 drop into both eyes 3 (three) times daily.   0   Cyanocobalamin (VITAMIN B-12) 2500 MCG SUBL Place 2,500 mcg under the tongue daily. (Patient not taking: Reported on 12/06/2022)     ferrous sulfate 325 (65 FE) MG tablet Take 325 mg by mouth every other day. (Patient not taking: Reported on 12/06/2022)     folic acid (FOLVITE) 1 MG tablet Take 1 tablet (1 mg total) by mouth daily.     insulin aspart (NOVOLOG) 100 UNIT/ML injection Inject 0-6 Units into the skin 3 (three) times daily with meals. CBG < 70: treat low blood sugar CBG 70 - 120: 0 units CBG 121 - 150: 0 units CBG 151 - 200: 1 unit CBG 201-250: 2 units CBG 251-300: 3 units CBG 301-350: 4 units CBG 351-400: 5 units CBG > 400: Give 6 units and call MD     latanoprost (XALATAN) 0.005 % ophthalmic solution Place 1 drop into both eyes every evening.     levothyroxine (SYNTHROID,  LEVOTHROID) 150 MCG tablet Take 150 mcg by mouth daily.  0   Magnesium Oxide 400 MG CAPS Take 400 mg by mouth daily.     metoprolol tartrate (LOPRESSOR) 50 MG tablet Take 1 tablet (50 mg total) by mouth 2 (two) times daily.     ondansetron (ZOFRAN) 8 MG tablet Take 1 tablet (8 mg total) by mouth every 8 (eight) hours as needed for nausea or vomiting. (Patient not taking: Reported on 01/16/2023) 30 tablet 0   pantoprazole (PROTONIX) 40 MG tablet Take 1 tablet (40 mg total) by mouth daily. (Patient not taking: Reported on 01/16/2023)     polyethylene glycol (MIRALAX) 17 g packet Take 17 g by mouth daily. 14 each 0   rosuvastatin (CRESTOR) 10 MG tablet Take 10 mg by mouth every evening. (Patient not taking: Reported on 12/06/2022)     sodium bicarbonate 650 MG tablet Take 2 tablets (1,300 mg total) by mouth 3 (three) times daily.     tamsulosin (FLOMAX) 0.4 MG CAPS capsule Take 0.4 mg by mouth daily. (Patient not taking: Reported on 12/06/2022)     No current facility-administered medications for this visit.    REVIEW OF SYSTEMS:    10 Point review of Systems was done is negative except as noted above.  PHYSICAL EXAMINATION: ECOG PERFORMANCE STATUS: 3 - Symptomatic, >50% confined to bed  . Vitals:   01/23/23 0938  BP: 93/61  Pulse: 89  Resp: 16  Temp: (!) 97.5 F (36.4 C)  SpO2: 99%   Filed Weights  .There is no height or weight on file to calculate BMI.  GENERAL:alert, in no acute distress and comfortable SKIN: no acute rashes, no significant lesions EYES: conjunctiva are pink and non-injected, sclera anicteric OROPHARYNX: MMM, no exudates, no oropharyngeal erythema or ulceration NECK: supple, no JVD LYMPH:  no palpable lymphadenopathy in the cervical, axillary or inguinal regions LUNGS: clear to auscultation b/l with normal respiratory effort HEART: regular rate & rhythm ABDOMEN:  normoactive bowel sounds , non tender, not distended. Extremity: no pedal edema PSYCH: alert &  oriented x 3 with fluent speech NEURO: no focal motor/sensory deficits  LABORATORY DATA:  I have reviewed the data as  listed .    Latest Ref Rng & Units 01/16/2023   12:40 PM 01/04/2023   10:26 AM 12/29/2022    5:09 AM  CBC  WBC 4.0 - 10.5 K/uL 7.1  5.5  4.9   Hemoglobin 13.0 - 17.0 g/dL 8.9  9.0  8.7   Hematocrit 39.0 - 52.0 % 26.7  27.4  27.4   Platelets 150 - 400 K/uL 248  240  118    .    Latest Ref Rng & Units 01/16/2023   12:40 PM 01/04/2023   10:26 AM 12/29/2022    5:09 AM  CMP  Glucose 70 - 99 mg/dL 657  846  962   BUN 8 - 23 mg/dL 16  12  17    Creatinine 0.61 - 1.24 mg/dL 9.52  8.41  3.24   Sodium 135 - 145 mmol/L 139  138  139   Potassium 3.5 - 5.1 mmol/L 3.7  3.8  3.3   Chloride 98 - 111 mmol/L 101  106  109   CO2 22 - 32 mmol/L 28  25  21    Calcium 8.9 - 10.3 mg/dL 8.4  7.4  7.5   Total Protein 6.5 - 8.1 g/dL 6.2  5.5  5.0   Total Bilirubin 0.3 - 1.2 mg/dL 0.9  0.9  1.5   Alkaline Phos 38 - 126 U/L 278  177  129   AST 15 - 41 U/L 25  22  18    ALT 0 - 44 U/L 25  19  19       Bone Marrow Biopsy 09/29/2022:    RADIOGRAPHIC STUDIES: I have personally reviewed the radiological images as listed and agreed with the findings in the report. DG CHEST PORT 1 VIEW  Result Date: 12/27/2022 CLINICAL DATA:  Cough.  Blood transfusion earlier today EXAM: PORTABLE CHEST 1 VIEW COMPARISON:  X-ray 12/04/2022 and CT FINDINGS: Portable semi upright view of the chest demonstrates underinflation. Calcified aorta. Normal cardiopericardial silhouette. No pneumothorax, effusion or consolidation. No edema. Degenerative changes seen. The lytic lesions identified by CT are only partially seen on the current examination. Please correlate for known lung mass and destructive spine process. IMPRESSION: Underinflation.  No pulmonary edema. Please correlate with prior workup for evaluation of known neoplasm including lung lesion and bone lesions Electronically Signed   By: Karen Kays M.D.   On:  12/27/2022 17:43   MR CERVICAL SPINE WO CONTRAST  Result Date: 12/27/2022 CLINICAL DATA:  Metastatic disease evaluation EXAM: MRI CERVICAL AND THORACIC SPINE WITHOUT CONTRAST TECHNIQUE: Multiplanar and multiecho pulse sequences of the cervical spine, to include the craniocervical junction and cervicothoracic junction, and the thoracic spine, were obtained without intravenous contrast. COMPARISON:  12/04/2022 CT cervical spine and CT chest FINDINGS: Evaluation is somewhat limited by the absence of intravenous contrast, which was not given as the patient declined to complete the exam. MRI CERVICAL SPINE FINDINGS Alignment: No significant listhesis. Vertebrae: Diffusely decreased T1 signal and somewhat heterogeneously increased T2 hyperintense signal, consistent with the patient's known multiple myeloma. No evidence of pathologic fracture. No evidence of discitis. Cord: Normal signal and morphology. Posterior Fossa, vertebral arteries, paraspinal tissues: No acute finding. Disc levels: C2-C3: Minimal disc bulge. No spinal canal stenosis or neural foraminal narrowing. C3-C4: No significant disc bulge. No spinal canal stenosis or neural foraminal narrowing. C4-C5: No significant disc bulge. No spinal canal stenosis or neuroforaminal narrowing. C5-C6: No significant disc bulge. No spinal canal stenosis or neuroforaminal narrowing. C6-C7: Central and right foraminal disc  protrusions. Ligamentum flavum hypertrophy. Moderate spinal canal stenosis. Mild left and moderate to severe right neural foraminal narrowing. C7-T1: No significant disc bulge. No spinal canal stenosis or neuroforaminal narrowing. MRI THORACIC SPINE FINDINGS Alignment: S-shaped curvature of the thoracolumbar spine. No listhesis. Vertebrae: Diffusely decreased T1 signal and somewhat heterogeneously increased T2 hyperintense signal, consistent with the patient's known multiple myeloma. No evidence of pathologic fracture. No evidence of discitis. Cord:   Normal signal and morphology Paraspinal and other soft tissues: Small left pleural effusion. Disc levels: No high-grade spinal canal stenosis. Mild left neural foraminal narrowing at T4-T5 and T9-T10. IMPRESSION: 1. Evaluation is somewhat limited by the absence of intravenous contrast, which was not given as the patient declined to complete the exam. Within this limitation, there is diffusely decreased T1 signal and somewhat heterogeneously increased T2 hyperintense signal, consistent with the patient's known multiple myeloma. No evidence of pathologic fracture. 2. C6-C7 moderate spinal canal stenosis with mild left and moderate to severe right neural foraminal narrowing. 3. No high-grade spinal canal stenosis in the thoracic spine. Mild left neural foraminal narrowing at T4-T5 and T9-T10. Electronically Signed   By: Wiliam Ke M.D.   On: 12/27/2022 03:46   MR THORACIC SPINE WO CONTRAST  Result Date: 12/27/2022 CLINICAL DATA:  Metastatic disease evaluation EXAM: MRI CERVICAL AND THORACIC SPINE WITHOUT CONTRAST TECHNIQUE: Multiplanar and multiecho pulse sequences of the cervical spine, to include the craniocervical junction and cervicothoracic junction, and the thoracic spine, were obtained without intravenous contrast. COMPARISON:  12/04/2022 CT cervical spine and CT chest FINDINGS: Evaluation is somewhat limited by the absence of intravenous contrast, which was not given as the patient declined to complete the exam. MRI CERVICAL SPINE FINDINGS Alignment: No significant listhesis. Vertebrae: Diffusely decreased T1 signal and somewhat heterogeneously increased T2 hyperintense signal, consistent with the patient's known multiple myeloma. No evidence of pathologic fracture. No evidence of discitis. Cord: Normal signal and morphology. Posterior Fossa, vertebral arteries, paraspinal tissues: No acute finding. Disc levels: C2-C3: Minimal disc bulge. No spinal canal stenosis or neural foraminal narrowing. C3-C4: No  significant disc bulge. No spinal canal stenosis or neural foraminal narrowing. C4-C5: No significant disc bulge. No spinal canal stenosis or neuroforaminal narrowing. C5-C6: No significant disc bulge. No spinal canal stenosis or neuroforaminal narrowing. C6-C7: Central and right foraminal disc protrusions. Ligamentum flavum hypertrophy. Moderate spinal canal stenosis. Mild left and moderate to severe right neural foraminal narrowing. C7-T1: No significant disc bulge. No spinal canal stenosis or neuroforaminal narrowing. MRI THORACIC SPINE FINDINGS Alignment: S-shaped curvature of the thoracolumbar spine. No listhesis. Vertebrae: Diffusely decreased T1 signal and somewhat heterogeneously increased T2 hyperintense signal, consistent with the patient's known multiple myeloma. No evidence of pathologic fracture. No evidence of discitis. Cord:  Normal signal and morphology Paraspinal and other soft tissues: Small left pleural effusion. Disc levels: No high-grade spinal canal stenosis. Mild left neural foraminal narrowing at T4-T5 and T9-T10. IMPRESSION: 1. Evaluation is somewhat limited by the absence of intravenous contrast, which was not given as the patient declined to complete the exam. Within this limitation, there is diffusely decreased T1 signal and somewhat heterogeneously increased T2 hyperintense signal, consistent with the patient's known multiple myeloma. No evidence of pathologic fracture. 2. C6-C7 moderate spinal canal stenosis with mild left and moderate to severe right neural foraminal narrowing. 3. No high-grade spinal canal stenosis in the thoracic spine. Mild left neural foraminal narrowing at T4-T5 and T9-T10. Electronically Signed   By: Wiliam Ke M.D.   On: 12/27/2022  03:46    ASSESSMENT & PLAN:   72 year old legally blind gentleman with significant functional limitations and limited social support presented with who was newly diagnosed with multiple myeloma and of May 2024 and was set up  for chemo counseling to start daratumumab dexamethasone Velcade and Xgeva and orders were placed and the patient was to get a PET scan but was lost to follow-up.  He had also been referred to social work at that time to help address his barriers of care. He is now admitted with progressive multiple myeloma.  1. Multiple myeloma, IgG kappa, diagnosed May 2024-untreated since patient did not follow-up now admitted with progressive myeloma with severe anemia and progressive renal failure.  CTs 12/04/2022-innumerable lytic lesions, large expansile lesion in the anterior right second rib, multiple pathologic rib fractures, expansile T4 (T3 on CT cervical spine) mass with soft tissue component extending into the central canal and left neural foramen, C7 transverse process fracture  Decadron daily x 4 starting 12/05/2022  Cycle 1 day 1 Velcade 12/07/2022  Decadron daily x 4 starting 12/12/2022  Cycle 1 day 8 Velcade 12/14/2022 2.  Acute/chronic renal failure 3.  Severe anemia 4.  Mild thrombocytopenia 5.  Diabetes 6.  Hypertension 7.  Glaucoma 8.  OSA  PLAN: -Discussed lab results from today, 01/23/2023, with the patient. CBC shows patient is anemic with hemoglobin of 8.1 g/dL and hematocrit at 40.9%. CMP shows elevated glucose level at 130, decreased potassium level at 3.3, decreased calcium level of 8.0, decreased total protein level at 5.5, elevated Alkaline phosphate level at 177, and decreased AST level at 9.  -Recommend to eat as much as possible and stay well hydrated.  -Recommend swallowing test at the rehab center.  -Patient tolerated his cycle 1 of his treatment well without any new or severe toxicities.  -Patient can proceed with cycle 2 day 8 of his treatment as planned without any dose modification during this visit. -no indication for blood transfusion today -proceed with Daratumumab as part of treatment  -Answered all of patient's and his wife's questions regarding treatment and other  general questions regarding multiple myeloma.   FOLLOW-UP: Please schedule cycle 2 and 3 per integrated scheduling. Labs and PRBC transfusion as needed for hemoglobin less than 8 weekly x 4 MD visit in 2 weeks  The total time spent in the appointment was 32 minutes* .  All of the patient's questions were answered with apparent satisfaction. The patient knows to call the clinic with any problems, questions or concerns.   Wyvonnia Lora MD MS AAHIVMS [facility] Hematology/Oncology Physician [facility]  .*Total Encounter Time as defined by the Centers for Medicare and Medicaid Services includes, in addition to the face-to-face time of a patient visit (documented in the note above) non-face-to-face time: obtaining and reviewing outside history, ordering and reviewing medications, tests or procedures, care coordination (communications with other health care professionals or caregivers) and documentation in the medical record.   I,Param Shah,acting as a Neurosurgeon for Wyvonnia Lora, MD.,have documented all relevant documentation on the behalf of Wyvonnia Lora, MD,as directed by  Wyvonnia Lora, MD while in the presence of Wyvonnia Lora, MD.   .I have reviewed the above documentation for accuracy and completeness, and I agree with the above. Johney Maine MD

## 2023-01-26 ENCOUNTER — Other Ambulatory Visit: Payer: Self-pay

## 2023-01-26 ENCOUNTER — Encounter (HOSPITAL_COMMUNITY): Payer: Self-pay

## 2023-01-26 ENCOUNTER — Inpatient Hospital Stay (HOSPITAL_COMMUNITY)
Admission: EM | Admit: 2023-01-26 | Discharge: 2023-02-01 | DRG: 640 | Disposition: A | Discharge status: Home Health | Payer: Medicare PPO | Attending: Internal Medicine | Admitting: Internal Medicine

## 2023-01-26 ENCOUNTER — Emergency Department (HOSPITAL_COMMUNITY): Payer: Medicare PPO

## 2023-01-26 DIAGNOSIS — N170 Acute kidney failure with tubular necrosis: Secondary | ICD-10-CM | POA: Diagnosis present

## 2023-01-26 DIAGNOSIS — Z803 Family history of malignant neoplasm of breast: Secondary | ICD-10-CM

## 2023-01-26 DIAGNOSIS — R627 Adult failure to thrive: Secondary | ICD-10-CM | POA: Diagnosis present

## 2023-01-26 DIAGNOSIS — Z608 Other problems related to social environment: Secondary | ICD-10-CM | POA: Diagnosis present

## 2023-01-26 DIAGNOSIS — I951 Orthostatic hypotension: Secondary | ICD-10-CM | POA: Diagnosis present

## 2023-01-26 DIAGNOSIS — E119 Type 2 diabetes mellitus without complications: Secondary | ICD-10-CM

## 2023-01-26 DIAGNOSIS — Z833 Family history of diabetes mellitus: Secondary | ICD-10-CM

## 2023-01-26 DIAGNOSIS — E1122 Type 2 diabetes mellitus with diabetic chronic kidney disease: Secondary | ICD-10-CM | POA: Diagnosis present

## 2023-01-26 DIAGNOSIS — I10 Essential (primary) hypertension: Secondary | ICD-10-CM | POA: Diagnosis present

## 2023-01-26 DIAGNOSIS — E7800 Pure hypercholesterolemia, unspecified: Secondary | ICD-10-CM | POA: Diagnosis present

## 2023-01-26 DIAGNOSIS — Z79899 Other long term (current) drug therapy: Secondary | ICD-10-CM

## 2023-01-26 DIAGNOSIS — D696 Thrombocytopenia, unspecified: Secondary | ICD-10-CM | POA: Diagnosis present

## 2023-01-26 DIAGNOSIS — Z8249 Family history of ischemic heart disease and other diseases of the circulatory system: Secondary | ICD-10-CM

## 2023-01-26 DIAGNOSIS — G4733 Obstructive sleep apnea (adult) (pediatric): Secondary | ICD-10-CM | POA: Diagnosis present

## 2023-01-26 DIAGNOSIS — H409 Unspecified glaucoma: Secondary | ICD-10-CM | POA: Diagnosis present

## 2023-01-26 DIAGNOSIS — H548 Legal blindness, as defined in USA: Secondary | ICD-10-CM | POA: Diagnosis present

## 2023-01-26 DIAGNOSIS — Z66 Do not resuscitate: Secondary | ICD-10-CM | POA: Diagnosis present

## 2023-01-26 DIAGNOSIS — W1830XA Fall on same level, unspecified, initial encounter: Secondary | ICD-10-CM | POA: Diagnosis present

## 2023-01-26 DIAGNOSIS — E039 Hypothyroidism, unspecified: Secondary | ICD-10-CM | POA: Diagnosis present

## 2023-01-26 DIAGNOSIS — K219 Gastro-esophageal reflux disease without esophagitis: Secondary | ICD-10-CM | POA: Diagnosis present

## 2023-01-26 DIAGNOSIS — N189 Chronic kidney disease, unspecified: Secondary | ICD-10-CM | POA: Diagnosis present

## 2023-01-26 DIAGNOSIS — Z515 Encounter for palliative care: Secondary | ICD-10-CM

## 2023-01-26 DIAGNOSIS — Z794 Long term (current) use of insulin: Secondary | ICD-10-CM

## 2023-01-26 DIAGNOSIS — C9000 Multiple myeloma not having achieved remission: Secondary | ICD-10-CM | POA: Diagnosis present

## 2023-01-26 DIAGNOSIS — F39 Unspecified mood [affective] disorder: Secondary | ICD-10-CM | POA: Diagnosis present

## 2023-01-26 DIAGNOSIS — T451X5A Adverse effect of antineoplastic and immunosuppressive drugs, initial encounter: Secondary | ICD-10-CM | POA: Diagnosis present

## 2023-01-26 DIAGNOSIS — D630 Anemia in neoplastic disease: Secondary | ICD-10-CM | POA: Diagnosis present

## 2023-01-26 DIAGNOSIS — Z1152 Encounter for screening for COVID-19: Secondary | ICD-10-CM

## 2023-01-26 DIAGNOSIS — L89611 Pressure ulcer of right heel, stage 1: Secondary | ICD-10-CM | POA: Diagnosis present

## 2023-01-26 DIAGNOSIS — E876 Hypokalemia: Secondary | ICD-10-CM | POA: Diagnosis not present

## 2023-01-26 DIAGNOSIS — Z888 Allergy status to other drugs, medicaments and biological substances status: Secondary | ICD-10-CM

## 2023-01-26 DIAGNOSIS — D6481 Anemia due to antineoplastic chemotherapy: Secondary | ICD-10-CM | POA: Insufficient documentation

## 2023-01-26 DIAGNOSIS — L8990 Pressure ulcer of unspecified site, unspecified stage: Secondary | ICD-10-CM | POA: Insufficient documentation

## 2023-01-26 DIAGNOSIS — I129 Hypertensive chronic kidney disease with stage 1 through stage 4 chronic kidney disease, or unspecified chronic kidney disease: Secondary | ICD-10-CM | POA: Diagnosis present

## 2023-01-26 DIAGNOSIS — Y92009 Unspecified place in unspecified non-institutional (private) residence as the place of occurrence of the external cause: Secondary | ICD-10-CM

## 2023-01-26 DIAGNOSIS — N179 Acute kidney failure, unspecified: Principal | ICD-10-CM

## 2023-01-26 DIAGNOSIS — L89152 Pressure ulcer of sacral region, stage 2: Secondary | ICD-10-CM | POA: Diagnosis present

## 2023-01-26 DIAGNOSIS — E860 Dehydration: Secondary | ICD-10-CM | POA: Diagnosis present

## 2023-01-26 DIAGNOSIS — Z79890 Hormone replacement therapy (postmenopausal): Secondary | ICD-10-CM

## 2023-01-26 LAB — COMPREHENSIVE METABOLIC PANEL
ALT: 14 U/L (ref 0–44)
AST: 18 U/L (ref 15–41)
Albumin: 2.4 g/dL — ABNORMAL LOW (ref 3.5–5.0)
Alkaline Phosphatase: 135 U/L — ABNORMAL HIGH (ref 38–126)
Anion gap: 14 (ref 5–15)
BUN: 31 mg/dL — ABNORMAL HIGH (ref 8–23)
CO2: 23 mmol/L (ref 22–32)
Calcium: 7.4 mg/dL — ABNORMAL LOW (ref 8.9–10.3)
Chloride: 98 mmol/L (ref 98–111)
Creatinine, Ser: 2.05 mg/dL — ABNORMAL HIGH (ref 0.61–1.24)
GFR, Estimated: 34 mL/min — ABNORMAL LOW (ref >60)
Glucose, Bld: 176 mg/dL — ABNORMAL HIGH (ref 70–99)
Potassium: 2.6 mmol/L — CL (ref 3.5–5.1)
Sodium: 135 mmol/L (ref 135–145)
Total Bilirubin: 0.8 mg/dL (ref 0.3–1.2)
Total Protein: 5.1 g/dL — ABNORMAL LOW (ref 6.5–8.1)

## 2023-01-26 LAB — CBC WITH DIFFERENTIAL/PLATELET
Abs Immature Granulocytes: 0.03 10*3/uL (ref 0.00–0.07)
Basophils Absolute: 0 10*3/uL (ref 0.0–0.1)
Basophils Relative: 0 %
Eosinophils Absolute: 0 10*3/uL (ref 0.0–0.5)
Eosinophils Relative: 0 %
HCT: 22.8 % — ABNORMAL LOW (ref 39.0–52.0)
Hemoglobin: 7.3 g/dL — ABNORMAL LOW (ref 13.0–17.0)
Immature Granulocytes: 1 %
Lymphocytes Relative: 23 %
Lymphs Abs: 1.1 10*3/uL (ref 0.7–4.0)
MCH: 29.7 pg (ref 26.0–34.0)
MCHC: 32 g/dL (ref 30.0–36.0)
MCV: 92.7 fL (ref 80.0–100.0)
Monocytes Absolute: 0.7 10*3/uL (ref 0.1–1.0)
Monocytes Relative: 16 %
Neutro Abs: 2.8 10*3/uL (ref 1.7–7.7)
Neutrophils Relative %: 60 %
Platelets: 119 10*3/uL — ABNORMAL LOW (ref 150–400)
RBC: 2.46 MIL/uL — ABNORMAL LOW (ref 4.22–5.81)
RDW: 17.5 % — ABNORMAL HIGH (ref 11.5–15.5)
WBC: 4.7 10*3/uL (ref 4.0–10.5)
nRBC: 0 % (ref 0.0–0.2)

## 2023-01-26 LAB — RESP PANEL BY RT-PCR (RSV, FLU A&B, COVID)  RVPGX2
Influenza A by PCR: NEGATIVE
Influenza B by PCR: NEGATIVE
Resp Syncytial Virus by PCR: NEGATIVE
SARS Coronavirus 2 by RT PCR: NEGATIVE

## 2023-01-26 LAB — TROPONIN I (HIGH SENSITIVITY): Troponin I (High Sensitivity): 32 ng/L — ABNORMAL HIGH (ref <18)

## 2023-01-26 LAB — CBG MONITORING, ED: Glucose-Capillary: 178 mg/dL — ABNORMAL HIGH (ref 70–99)

## 2023-01-26 LAB — LIPASE, BLOOD: Lipase: 32 U/L (ref 11–51)

## 2023-01-26 MED ORDER — POTASSIUM CHLORIDE 20 MEQ PO PACK: 40.0000 meq | PACK | Freq: Every day | ORAL | Status: DC

## 2023-01-26 MED ORDER — POTASSIUM CHLORIDE 20 MEQ PO PACK
40.0000 meq | PACK | Freq: Three times a day (TID) | ORAL | Status: DC
  Administered 2023-01-26: 40 meq via ORAL
  Filled 2023-01-26: qty 2

## 2023-01-26 MED ORDER — SODIUM CHLORIDE 0.9 % IV BOLUS
1000.0000 mL | Freq: Once | INTRAVENOUS | Status: AC
  Administered 2023-01-26: 1000 mL via INTRAVENOUS

## 2023-01-26 MED ORDER — POTASSIUM CHLORIDE 10 MEQ/100ML IV SOLN
10.0000 meq | INTRAVENOUS | Status: AC
  Administered 2023-01-26 – 2023-01-27 (×4): 10 meq via INTRAVENOUS
  Filled 2023-01-26 (×4): qty 100

## 2023-01-26 NOTE — ED Provider Notes (Signed)
[place] [facility] AT [facility] Provider Note   CSN: [id number] Arrival date & time: 01/26/23  1542     History  Chief Complaint  Patient presents with   Hypotension   Fall    Scott Bass is a 72 y.o. male.  Patient here after fall.  Maybe had low blood pressure with EMS but has normal blood pressure upon arrival here.  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  He ambulates with a walker at baseline.  He thinks that he hit an object when he tried to go to the bathroom and fell.  He was unable to get up.  Question if there is maybe 2 falls today but he states only 1.  He has no pain.  He did not lose consciousness.  He is not on blood thinners.  He has a history of hypertension, high cholesterol.  Denies any abdominal pain, nausea vomiting diarrhea.  Lives at home with his wife.  Denies any chest pain shortness of breath.  Denies any fever nausea vomiting.  The history is provided by the patient.       Home Medications Prior to Admission medications   Medication Sig Start Date End Date Taking? Authorizing Provider  acetaminophen (TYLENOL) 500 MG tablet Take 2 tablets (1,000 mg total) by mouth every 8 (eight) hours as needed for moderate pain. 12/29/22   Zigmund Daniel., MD  acyclovir (ZOVIRAX) 400 MG tablet Take 1 tablet (400 mg total) by mouth 2 (two) times daily. 01/04/23   Johney Maine, MD  brimonidine (ALPHAGAN) 0.2 % ophthalmic solution Place 1 drop into both eyes 3 (three) times daily.  01/17/15   [provider]  Cyanocobalamin (VITAMIN B-12) 2500 MCG SUBL Place 2,500 mcg under the tongue daily. Patient not taking: Reported on 12/06/2022    [provider]  ferrous sulfate 325 (65 FE) MG tablet Take 325 mg by mouth every other day. Patient not taking: Reported on 12/06/2022 07/18/22   [provider]  folic acid (FOLVITE) 1 MG tablet Take 1 tablet (1 mg total) by mouth daily. 12/30/22   Zigmund Daniel., MD   insulin aspart (NOVOLOG) 100 UNIT/ML injection Inject 0-6 Units into the skin 3 (three) times daily with meals. CBG < 70: treat low blood sugar CBG 70 - 120: 0 units CBG 121 - 150: 0 units CBG 151 - 200: 1 unit CBG 201-250: 2 units CBG 251-300: 3 units CBG 301-350: 4 units CBG 351-400: 5 units CBG > 400: Give 6 units and call MD 12/29/22   Zigmund Daniel., MD  latanoprost (XALATAN) 0.005 % ophthalmic solution Place 1 drop into both eyes every evening.    [provider]  levothyroxine (SYNTHROID, LEVOTHROID) 150 MCG tablet Take 150 mcg by mouth daily. 12/10/14   [provider]  Magnesium Oxide 400 MG CAPS Take 400 mg by mouth daily. 09/19/22   [provider]  metoprolol tartrate (LOPRESSOR) 50 MG tablet Take 1 tablet (50 mg total) by mouth 2 (two) times daily. 12/29/22   Zigmund Daniel., MD  ondansetron (ZOFRAN) 8 MG tablet Take 1 tablet (8 mg total) by mouth every 8 (eight) hours as needed for nausea or vomiting. Patient not taking: Reported on 01/16/2023 01/04/23   Johney Maine, MD  pantoprazole (PROTONIX) 40 MG tablet Take 1 tablet (40 mg total) by mouth daily. Patient not taking: Reported on 01/16/2023 12/29/22   Zigmund Daniel., MD  polyethylene glycol (MIRALAX) 17 g packet Take 17 g by mouth daily. 10/05/22   Johney Maine, MD  rosuvastatin (CRESTOR) 10 MG tablet Take 10 mg by mouth every evening. Patient not taking: Reported on 12/06/2022    [provider]  sodium bicarbonate 650 MG tablet Take 2 tablets (1,300 mg total) by mouth 3 (three) times daily. 12/29/22   Zigmund Daniel., MD  tamsulosin (FLOMAX) 0.4 MG CAPS capsule Take 0.4 mg by mouth daily. Patient not taking: Reported on 12/06/2022 08/22/22   [provider]      Allergies    Hctz [hydrochlorothiazide]    Review of Systems   Review of Systems  Physical Exam Updated Vital Signs  ED Triage Vitals  Encounter Vitals Group     BP 01/26/23  1651 105/61     Systolic BP Percentile --      Diastolic BP Percentile --      Pulse Rate 01/26/23 1651 90     Resp 01/26/23 1651 16     Temp 01/26/23 1713 98.1 F (36.7 C)     Temp Source 01/26/23 1713 Oral     SpO2 01/26/23 1651 99 %     Weight 01/26/23 1551 150 lb 12.7 oz (68.4 kg)     Height 01/26/23 1551 5\' 6"  (1.676 m)     Head Circumference --      Peak Flow --      Pain Score 01/26/23 1551 0     Pain Loc --      Pain Education --      Exclude from Growth Chart --     Physical Exam Vitals and nursing note reviewed.  Constitutional:      General: He is not in acute distress.    Appearance: He is well-developed. He is not ill-appearing.  HENT:     Head: Normocephalic and atraumatic.  Eyes:     Extraocular Movements: Extraocular movements intact.     Conjunctiva/sclera: Conjunctivae normal.  Cardiovascular:     Rate and Rhythm: Normal rate and regular rhythm.     Pulses: Normal pulses.     Heart sounds: Normal heart sounds. No murmur heard. Pulmonary:     Effort: Pulmonary effort is normal. No respiratory distress.     Breath sounds: Normal breath sounds.  Abdominal:     Palpations: Abdomen is soft.     Tenderness: There is no abdominal tenderness.  Musculoskeletal:        General: No swelling or tenderness. Normal range of motion.     Cervical back: Normal range of motion and neck supple.  Skin:    General: Skin is warm and dry.     Capillary Refill: Capillary refill takes less than 2 seconds.  Neurological:     General: No focal deficit present.     Mental Status: He is alert and oriented to person, place, and time.     Cranial Nerves: No cranial nerve deficit.     Sensory: No sensory deficit.     Motor: No weakness.     Coordination: Coordination normal.  Psychiatric:        Mood and Affect: Mood normal.     ED Results / Procedures / Treatments   Labs (all labs ordered are listed, but only abnormal results are displayed) Labs Reviewed  CBC WITH  DIFFERENTIAL/PLATELET - Abnormal; Notable for the following components:      Result Value   RBC 2.46 (*)    Hemoglobin 7.3 (*)  HCT 22.8 (*)    RDW 17.5 (*)    Platelets 119 (*)    All other components within normal limits  COMPREHENSIVE METABOLIC PANEL - Abnormal; Notable for the following components:   Potassium 2.6 (*)    Glucose, Bld 176 (*)    BUN 31 (*)    Creatinine, Ser 2.05 (*)    Calcium 7.4 (*)    Total Protein 5.1 (*)    Albumin 2.4 (*)    Alkaline Phosphatase 135 (*)    GFR, Estimated 34 (*)    All other components within normal limits  CBG MONITORING, ED - Abnormal; Notable for the following components:   Glucose-Capillary 178 (*)    All other components within normal limits  TROPONIN I (HIGH SENSITIVITY) - Abnormal; Notable for the following components:   Troponin I (High Sensitivity) 32 (*)    All other components within normal limits  URINE CULTURE  RESP PANEL BY RT-PCR (RSV, FLU A&B, COVID)  RVPGX2  LIPASE, BLOOD  URINALYSIS, ROUTINE W REFLEX MICROSCOPIC  TROPONIN I (HIGH SENSITIVITY)    EKG None  Radiology DG Pelvis 1-2 Views  Result Date: 01/26/2023 CLINICAL DATA:  Status post recent fall. EXAM: PELVIS - 1-2 VIEW COMPARISON:  None Available. FINDINGS: There is no evidence of pelvic fracture or diastasis. Moderate severity degenerative changes are seen involving both hips, in the form of joint space narrowing, acetabular sclerosis and lateral acetabular bony spurring. No pelvic bone lesions are seen. IMPRESSION: Moderate severity degenerative changes without an acute osseous abnormality. Electronically Signed   By: Aram Candela M.D.   On: 01/26/2023 19:12   DG Chest 2 View  Result Date: 01/26/2023 CLINICAL DATA:  Weakness, hypotension and recent falls. EXAM: CHEST - 2 VIEW COMPARISON:  December 27, 2022 FINDINGS: The heart size and mediastinal contours are within normal limits. There is marked severity calcification of the aortic arch. Both lungs are  clear. Multilevel degenerative changes seen throughout the thoracic spine. IMPRESSION: No active cardiopulmonary disease. Electronically Signed   By: Aram Candela M.D.   On: 01/26/2023 19:09   CT Head Wo Contrast  Result Date: 01/26/2023 CLINICAL DATA:  Polytrauma, blunt.  History of multiple myeloma EXAM: CT HEAD WITHOUT CONTRAST CT CERVICAL SPINE WITHOUT CONTRAST TECHNIQUE: Multidetector CT imaging of the head and cervical spine was performed following the standard protocol without intravenous contrast. Multiplanar CT image reconstructions of the cervical spine were also generated. RADIATION DOSE REDUCTION: This exam was performed according to the departmental dose-optimization program which includes automated exposure control, adjustment of the mA and/or kV according to patient size and/or use of iterative reconstruction technique. COMPARISON:  12/04/2022 FINDINGS: CT HEAD FINDINGS Brain: No evidence of acute infarction, hemorrhage, hydrocephalus, extra-axial collection or mass lesion/mass effect. Vascular: Atherosclerotic calcifications involving the large vessels of the skull base. No unexpected hyperdense vessel. Skull: Redemonstration of scattered lucent lesions throughout the bony calvarium. No acute calvarial fracture. Sinuses/Orbits: No acute finding. Other: Negative for scalp hematoma. CT CERVICAL SPINE FINDINGS Alignment: Facet joints are aligned without dislocation or traumatic listhesis. Dens and lateral masses are aligned. Skull base and vertebrae: No acute fracture is identified. Widespread lytic lesions throughout the visualized osseous structures compatible with known multiple myeloma. Soft tissues and spinal canal: No prevertebral fluid or swelling. No visible canal hematoma. Disc levels: Bulky bridging anterior endplate osteophytes spanning from C2 to C6. Relative preservation of the disc heights. No advanced facet arthropathy. Upper chest: Included lung apices are clear. Other:  Bilateral carotid  atherosclerosis. IMPRESSION: 1. No acute intracranial abnormality. 2. No acute fracture or subluxation of the cervical spine. 3. Widespread lytic lesions throughout the visualized osseous structures compatible with known multiple myeloma. Electronically Signed   By: Duanne Guess D.O.   On: 01/26/2023 18:53   CT Cervical Spine Wo Contrast  Result Date: 01/26/2023 CLINICAL DATA:  Polytrauma, blunt.  History of multiple myeloma EXAM: CT HEAD WITHOUT CONTRAST CT CERVICAL SPINE WITHOUT CONTRAST TECHNIQUE: Multidetector CT imaging of the head and cervical spine was performed following the standard protocol without intravenous contrast. Multiplanar CT image reconstructions of the cervical spine were also generated. RADIATION DOSE REDUCTION: This exam was performed according to the departmental dose-optimization program which includes automated exposure control, adjustment of the mA and/or kV according to patient size and/or use of iterative reconstruction technique. COMPARISON:  12/04/2022 FINDINGS: CT HEAD FINDINGS Brain: No evidence of acute infarction, hemorrhage, hydrocephalus, extra-axial collection or mass lesion/mass effect. Vascular: Atherosclerotic calcifications involving the large vessels of the skull base. No unexpected hyperdense vessel. Skull: Redemonstration of scattered lucent lesions throughout the bony calvarium. No acute calvarial fracture. Sinuses/Orbits: No acute finding. Other: Negative for scalp hematoma. CT CERVICAL SPINE FINDINGS Alignment: Facet joints are aligned without dislocation or traumatic listhesis. Dens and lateral masses are aligned. Skull base and vertebrae: No acute fracture is identified. Widespread lytic lesions throughout the visualized osseous structures compatible with known multiple myeloma. Soft tissues and spinal canal: No prevertebral fluid or swelling. No visible canal hematoma. Disc levels: Bulky bridging anterior endplate osteophytes spanning from  C2 to C6. Relative preservation of the disc heights. No advanced facet arthropathy. Upper chest: Included lung apices are clear. Other: Bilateral carotid atherosclerosis. IMPRESSION: 1. No acute intracranial abnormality. 2. No acute fracture or subluxation of the cervical spine. 3. Widespread lytic lesions throughout the visualized osseous structures compatible with known multiple myeloma. Electronically Signed   By: Duanne Guess D.O.   On: 01/26/2023 18:53    Procedures Procedures    Medications Ordered in ED Medications  sodium chloride 0.9 % bolus 1,000 mL (has no administration in time range)  potassium chloride 10 mEq in 100 mL IVPB (has no administration in time range)  potassium chloride (KLOR-CON) packet 40 mEq (has no administration in time range)    ED Course/ Medical Decision Making/ A&P                                 Medical Decision Making Amount and/or Complexity of Data Reviewed Labs: ordered. Radiology: ordered.  Risk Prescription drug management. Decision regarding hospitalization.   Scott Bass is here after fall, generalized weakness.  Vitals unremarkable.  History of low vision, hypertension, diabetes.  Overall he looks unkept.  He lives at home with his wife.  He has no specific complaints.  He states he was walking with his walker and thinks he hit something and fell.  Can get up.  EMS states that did not look like good living conditions at the house.  He has some low blood pressure with them but normal here.  He denies any pain.  Did not lose consciousness.  Denies any nausea vomiting diarrhea cough fever sputum production.  Does look dry on exam.  Differential diagnosis could be dehydration versus electrolyte abnormality versus traumatic process versus infectious process.  Vital signs however are reassuring.  Seems less likely to be sepsis.  Will assess broad workup with EKG, CBC CMP lipase  urinalysis checks x-ray head CT neck CT, urinalysis, COVID test,  will give IV fluids and reevaluate.  Per my review interpretation labs patient does have an AKI with a creatinine of 2.05.  Potassium 2.6.  Hemoglobin 7.3.  Baseline around 8.  No significant leukocytosis.  Troponin is 32 but that is baseline.  Chest x-ray shows no evidence of pneumonia pneumothorax.  I reviewed and interpreted x-ray.  CT scan per radiology report with no acute findings.  Does have some multiple myeloma changes.  Ultimately patient does appear to be thin, dehydrated.  I do not have any concern for sepsis.  Urinalysis is still pending.  He will benefit from hydration, potassium repletion and PT and OT and social work assessment.  I have not been able to get in touch with family.  Will admit to medicine for further care.  This chart was dictated using voice recognition software.  Despite best efforts to proofread,  errors can occur which can change the documentation meaning.         Final Clinical Impression(s) / ED Diagnoses Final diagnoses:  AKI (acute kidney injury) (HCC)  Hypokalemia    Rx / DC Orders ED Discharge Orders     None         Virgina Norfolk, DO 01/26/23 1926

## 2023-01-26 NOTE — ED Notes (Signed)
ED TO INPATIENT HANDOFF REPORT  Name/Age/Gender Scott Bass 72 y.o. male  Code Status    Code Status Orders  (From admission, onward)           Start     Ordered   01/26/23 1955  Do not attempt resuscitation (DNR)- Limited -Do Not Intubate (DNI)  Continuous       Question Answer Comment  If pulseless and not breathing No CPR or chest compressions.   In Pre-Arrest Conditions (Patient Is Breathing and Has A Pulse) Do not intubate. Provide all appropriate non-invasive medical interventions. Avoid ICU transfer unless indicated or required.   Consent: Discussion documented in EHR or advanced directives reviewed      01/26/23 1955           Code Status History     Date Active Date Inactive Code Status Order ID Comments User Context   12/07/2022 1328 12/29/2022 1728 DNR [id number]  Alena Bills, DO Inpatient   12/04/2022 0951 12/07/2022 1328 Full Code [id number]  Simonne Martinet, NP ED   09/25/2022 2254 10/01/2022 1703 Full Code [id number]  Darlin Drop, DO ED   12/18/2017 1250 12/19/2017 1229 Full Code [id number]  Elder Negus, MD Inpatient   02/17/2015 0611 02/18/2015 1856 Full Code [id number]  Eduard Clos, MD Inpatient       Home/SNF/Other Nursing Home  Chief Complaint Acute kidney injury (AKI) with acute tubular necrosis (ATN) (HCC) [N17.0]  Level of Care/Admitting Diagnosis ED Disposition     ED Disposition  Admit   Condition  --   Comment  Hospital Area: [facility] [[id number]]  Level of Care: Telemetry [5]  Admit to tele based on following criteria: Monitor QTC interval  May place patient in observation at [facility] or [name] if equivalent level of care is available:: Yes  Covid Evaluation: Asymptomatic - no recent exposure (last 10 days) testing not required  Diagnosis: Acute kidney injury (AKI) with acute tubular necrosis (ATN) (HCC) [[id number]]  Admitting Physician: Dorcas Carrow [[id number]]  Attending Physician:  Dorcas Carrow [[id number]]          Medical History Past Medical History:  Diagnosis Date   Arthritis    "lower back" (12/18/2017)   Better eye: moderate vision impairment; lesser eye: blind    GERD (gastroesophageal reflux disease)    Glaucoma, both eyes    High cholesterol    Hypertension    Hypothyroidism    Irregular heartbeat    Legally blind    "both eyes; can see some out of left eye"   OSA (obstructive sleep apnea)    Thyroid disease    Type II diabetes mellitus (HCC)    Vitamin B12 deficiency     Allergies Allergies  Allergen Reactions   Hctz [Hydrochlorothiazide] Other (See Comments)    Pancreatitis     IV Location/Drains/Wounds Patient Lines/Drains/Airways Status     Active Line/Drains/Airways     Name Placement date Placement time Site Days   Peripheral IV 01/26/23 22 G 1.75" Anterior;Right Forearm 01/26/23  2116  Forearm  less than 1            Labs/Imaging Results for orders placed or performed during the hospital encounter of 01/26/23 (from the past 48 hour(s))  POC CBG, ED     Status: Abnormal   Collection Time: 01/26/23  5:13 PM  Result Value Ref Range   Glucose-Capillary 178 (H) 70 - 99 mg/dL    Comment: Glucose reference range  applies only to samples taken after fasting for at least 8 hours.  CBC with Differential     Status: Abnormal   Collection Time: 01/26/23  6:05 PM  Result Value Ref Range   WBC 4.7 4.0 - 10.5 K/uL   RBC 2.46 (L) 4.22 - 5.81 MIL/uL   Hemoglobin 7.3 (L) 13.0 - 17.0 g/dL   HCT 16.1 (L) 09.6 - 04.5 %   MCV 92.7 80.0 - 100.0 fL   MCH 29.7 26.0 - 34.0 pg   MCHC 32.0 30.0 - 36.0 g/dL   RDW 40.9 (H) 81.1 - 91.4 %   Platelets 119 (L) 150 - 400 K/uL   nRBC 0.0 0.0 - 0.2 %   Neutrophils Relative % 60 %   Neutro Abs 2.8 1.7 - 7.7 K/uL   Lymphocytes Relative 23 %   Lymphs Abs 1.1 0.7 - 4.0 K/uL   Monocytes Relative 16 %   Monocytes Absolute 0.7 0.1 - 1.0 K/uL   Eosinophils Relative 0 %   Eosinophils Absolute 0.0 0.0  - 0.5 K/uL   Basophils Relative 0 %   Basophils Absolute 0.0 0.0 - 0.1 K/uL   Immature Granulocytes 1 %   Abs Immature Granulocytes 0.03 0.00 - 0.07 K/uL    Comment: Performed at [facility], [address] [place], [place] [address]  Comprehensive metabolic panel     Status: Abnormal   Collection Time: 01/26/23  6:05 PM  Result Value Ref Range   Sodium 135 135 - 145 mmol/L   Potassium 2.6 (LL) 3.5 - 5.1 mmol/L    Comment: CRITICAL RESULT CALLED TO, READ BACK BY AND VERIFIED WITH WATES, L RN @ 1844 01/26/23. GILBERTL    Chloride 98 98 - 111 mmol/L   CO2 23 22 - 32 mmol/L   Glucose, Bld 176 (H) 70 - 99 mg/dL    Comment: Glucose reference range applies only to samples taken after fasting for at least 8 hours.   BUN 31 (H) 8 - 23 mg/dL   Creatinine, Ser 6.21 (H) 0.61 - 1.24 mg/dL   Calcium 7.4 (L) 8.9 - 10.3 mg/dL   Total Protein 5.1 (L) 6.5 - 8.1 g/dL   Albumin 2.4 (L) 3.5 - 5.0 g/dL   AST 18 15 - 41 U/L   ALT 14 0 - 44 U/L   Alkaline Phosphatase 135 (H) 38 - 126 U/L   Total Bilirubin 0.8 0.3 - 1.2 mg/dL   GFR, Estimated 34 (L) >60 mL/min    Comment: (NOTE) Calculated using the CKD-EPI Creatinine Equation (2021)    Anion gap 14 5 - 15    Comment: Performed at [facility], [address] [place], [place] [address]  Lipase, blood     Status: None   Collection Time: 01/26/23  6:05 PM  Result Value Ref Range   Lipase 32 11 - 51 U/L    Comment: Performed at [facility], [address] [place], [place] [address]  Troponin I (High Sensitivity)     Status: Abnormal   Collection Time: 01/26/23  6:05 PM  Result Value Ref Range   Troponin I (High Sensitivity) 32 (H) <18 ng/L    Comment: (NOTE) Elevated high sensitivity troponin I (hsTnI) values and significant  changes across serial measurements may suggest ACS but many other  chronic and acute conditions are known to elevate hsTnI results.  Refer to the "Links" section  for chest pain algorithms and additional  guidance. Performed at [facility], [address], [place], [place] [address]    DG Pelvis 1-2 Views  Result Date: 01/26/2023 CLINICAL DATA:  Status post recent fall. EXAM: PELVIS - 1-2 VIEW COMPARISON:  None Available. FINDINGS: There is no evidence of pelvic fracture or diastasis. Moderate severity degenerative changes are seen involving both hips, in the form of joint space narrowing, acetabular sclerosis and lateral acetabular bony spurring. No pelvic bone lesions are seen. IMPRESSION: Moderate severity degenerative changes without an acute osseous abnormality. Electronically Signed   By: Aram Candela M.D.   On: 01/26/2023 19:12   DG Chest 2 View  Result Date: 01/26/2023 CLINICAL DATA:  Weakness, hypotension and recent falls. EXAM: CHEST - 2 VIEW COMPARISON:  December 27, 2022 FINDINGS: The heart size and mediastinal contours are within normal limits. There is marked severity calcification of the aortic arch. Both lungs are clear. Multilevel degenerative changes seen throughout the thoracic spine. IMPRESSION: No active cardiopulmonary disease. Electronically Signed   By: Aram Candela M.D.   On: 01/26/2023 19:09   CT Head Wo Contrast  Result Date: 01/26/2023 CLINICAL DATA:  Polytrauma, blunt.  History of multiple myeloma EXAM: CT HEAD WITHOUT CONTRAST CT CERVICAL SPINE WITHOUT CONTRAST TECHNIQUE: Multidetector CT imaging of the head and cervical spine was performed following the standard protocol without intravenous contrast. Multiplanar CT image reconstructions of the cervical spine were also generated. RADIATION DOSE REDUCTION: This exam was performed according to the departmental dose-optimization program which includes automated exposure control, adjustment of the mA and/or kV according to patient size and/or use of iterative reconstruction technique. COMPARISON:  12/04/2022 FINDINGS: CT HEAD FINDINGS Brain: No evidence of  acute infarction, hemorrhage, hydrocephalus, extra-axial collection or mass lesion/mass effect. Vascular: Atherosclerotic calcifications involving the large vessels of the skull base. No unexpected hyperdense vessel. Skull: Redemonstration of scattered lucent lesions throughout the bony calvarium. No acute calvarial fracture. Sinuses/Orbits: No acute finding. Other: Negative for scalp hematoma. CT CERVICAL SPINE FINDINGS Alignment: Facet joints are aligned without dislocation or traumatic listhesis. Dens and lateral masses are aligned. Skull base and vertebrae: No acute fracture is identified. Widespread lytic lesions throughout the visualized osseous structures compatible with known multiple myeloma. Soft tissues and spinal canal: No prevertebral fluid or swelling. No visible canal hematoma. Disc levels: Bulky bridging anterior endplate osteophytes spanning from C2 to C6. Relative preservation of the disc heights. No advanced facet arthropathy. Upper chest: Included lung apices are clear. Other: Bilateral carotid atherosclerosis. IMPRESSION: 1. No acute intracranial abnormality. 2. No acute fracture or subluxation of the cervical spine. 3. Widespread lytic lesions throughout the visualized osseous structures compatible with known multiple myeloma. Electronically Signed   By: Duanne Guess D.O.   On: 01/26/2023 18:53   CT Cervical Spine Wo Contrast  Result Date: 01/26/2023 CLINICAL DATA:  Polytrauma, blunt.  History of multiple myeloma EXAM: CT HEAD WITHOUT CONTRAST CT CERVICAL SPINE WITHOUT CONTRAST TECHNIQUE: Multidetector CT imaging of the head and cervical spine was performed following the standard protocol without intravenous contrast. Multiplanar CT image reconstructions of the cervical spine were also generated. RADIATION DOSE REDUCTION: This exam was performed according to the departmental dose-optimization program which includes automated exposure control, adjustment of the mA and/or kV according to  patient size and/or use of iterative reconstruction technique. COMPARISON:  12/04/2022 FINDINGS: CT HEAD FINDINGS Brain: No evidence of acute infarction, hemorrhage, hydrocephalus, extra-axial collection or mass lesion/mass effect. Vascular: Atherosclerotic calcifications involving the large vessels of the skull base. No unexpected hyperdense vessel. Skull: Redemonstration  of scattered lucent lesions throughout the bony calvarium. No acute calvarial fracture. Sinuses/Orbits: No acute finding. Other: Negative for scalp hematoma. CT CERVICAL SPINE FINDINGS Alignment: Facet joints are aligned without dislocation or traumatic listhesis. Dens and lateral masses are aligned. Skull base and vertebrae: No acute fracture is identified. Widespread lytic lesions throughout the visualized osseous structures compatible with known multiple myeloma. Soft tissues and spinal canal: No prevertebral fluid or swelling. No visible canal hematoma. Disc levels: Bulky bridging anterior endplate osteophytes spanning from C2 to C6. Relative preservation of the disc heights. No advanced facet arthropathy. Upper chest: Included lung apices are clear. Other: Bilateral carotid atherosclerosis. IMPRESSION: 1. No acute intracranial abnormality. 2. No acute fracture or subluxation of the cervical spine. 3. Widespread lytic lesions throughout the visualized osseous structures compatible with known multiple myeloma. Electronically Signed   By: Duanne Guess D.O.   On: 01/26/2023 18:53    Pending Labs Unresulted Labs (From admission, onward)     Start     Ordered   01/27/23 0500  Basic metabolic panel  Tomorrow morning,   R        01/26/23 1939   01/27/23 0500  Magnesium  Tomorrow morning,   R        01/26/23 1939   01/27/23 0500  Phosphorus  Tomorrow morning,   R        01/26/23 1939   01/27/23 0500  CBC with Differential/Platelet  Tomorrow morning,   R        01/26/23 1939   01/26/23 1927  Magnesium  Once,   STAT        01/26/23  1926   01/26/23 1655  Urinalysis, Routine w reflex microscopic -Urine, Clean Catch  Once,   URGENT       Question:  Specimen Source  Answer:  Urine, Clean Catch   01/26/23 1656   01/26/23 1655  Urine Culture  Once,   URGENT       Question:  Indication  Answer:  Dysuria   01/26/23 1656   01/26/23 1655  Resp panel by RT-PCR (RSV, Flu A&B, Covid) Anterior Nasal Swab  Once,   URGENT        01/26/23 1656            Vitals/Pain Today's Vitals   01/26/23 1551 01/26/23 1651 01/26/23 1713 01/26/23 2100  BP:  105/61    Pulse:  90    Resp:  16    Temp:   98.1 F (36.7 C) 97.8 F (36.6 C)  TempSrc:   Oral Oral  SpO2:  99%    Weight: 68.4 kg     Height: 5\' 6"  (1.676 m)     PainSc: 0-No pain       Isolation Precautions No active isolations  Medications Medications  potassium chloride 10 mEq in 100 mL IVPB (has no administration in time range)  potassium chloride (KLOR-CON) packet 40 mEq (has no administration in time range)  sodium chloride 0.9 % bolus 1,000 mL (1,000 mLs Intravenous New Bag/Given 01/26/23 2122)    Mobility walks with device

## 2023-01-26 NOTE — Plan of Care (Signed)
  Problem: Coping: Goal: Ability to adjust to condition or change in health will improve Outcome: Progressing   Problem: Health Behavior/Discharge Planning: Goal: Ability to identify and utilize available resources and services will improve Outcome: Progressing   Problem: Coping: Goal: Level of anxiety will decrease Outcome: Progressing   Problem: Elimination: Goal: Will not experience complications related to bowel motility Outcome: Progressing Goal: Will not experience complications related to urinary retention Outcome: Progressing   Problem: Pain Managment: Goal: General experience of comfort will improve Outcome: Progressing

## 2023-01-26 NOTE — H&P (Signed)
History and Physical    JARAMIE BUHL YQM:[id number] DOB: 1951-04-11 DOA: 01/26/2023  PCP: Tracey Harries, MD  Patient coming from: Fall  I have personally briefly reviewed patient's old medical records available.   Chief Complaint: I fell in the bathroom  HPI: CARVELL LAMBERG is a 72 y.o. male with medical history significant of congenital blindness, GERD, hyperlipidemia, hypertension, hypothyroidism, type 2 diabetes, recently diagnosed multiple myeloma on chemotherapy, last chemotherapy on 9/17 presents from home with fall in the bathroom.  Patient is blind, he is also married to blind wife.  Patient tells me that he was trying to sit in the commode, he may state and fell on the floor and was not able to get up.  His wife tried to get him off the floor and she could not do it so she called 911.  Usually he is ambulatory without any support.  EMS brought him to the hospital.  Patient himself denies any complaints and wants to go home. Patient does feel dry mouth.  Patient tells me that his appetite is okay but he may not drink plenty of water.  Patient denies any nausea vomiting or poor appetite.  Denies any abdominal pain.  Denies any body ache or bone pain.  His bowel habits are normal.  Patient tells me that he is doing good on chemotherapy. ED Course: Hemodynamically stable.  Potassium 2.6.  Magnesium levels pending.  Creatinine 2.05 with recent normal creatinine.  Bicarbonate is adequate.  Hemoglobin 7.3 consistent with his chronic anemia.  Blood sugars 178.  Skeletal survey negative for any acute fractures.  Started on potassium supplementations, IV fluids.  Needs admission due to significant changes. EMS also reported poor living condition.  Review of Systems: all systems are reviewed and pertinent positive as per HPI otherwise rest are negative.    Past Medical History:  Diagnosis Date   Arthritis    "lower back" (12/18/2017)   Better eye: moderate vision impairment; lesser eye: blind     GERD (gastroesophageal reflux disease)    Glaucoma, both eyes    High cholesterol    Hypertension    Hypothyroidism    Irregular heartbeat    Legally blind    "both eyes; can see some out of left eye"   OSA (obstructive sleep apnea)    Thyroid disease    Type II diabetes mellitus (HCC)    Vitamin B12 deficiency     Past Surgical History:  Procedure Laterality Date   LEFT HEART CATH AND CORONARY ANGIOGRAPHY N/A 12/18/2017   Procedure: LEFT HEART CATH AND CORONARY ANGIOGRAPHY;  Surgeon: Elder Negus, MD;  Location: [facility];  Service: Cardiovascular;  Laterality: N/A;   NOSE SURGERY     "was crooked; they straightened it out" (12/18/2017)    Social history   reports that he has never smoked. He has never used smokeless tobacco. He reports that he does not currently use alcohol. He reports that he does not use drugs.  Allergies  Allergen Reactions   Hctz [Hydrochlorothiazide] Other (See Comments)    Pancreatitis     Family History  Problem Relation Age of Onset   Diabetes Mellitus II Mother 24   Diabetes Mellitus II Sister    Breast cancer Sister    Heart failure Sister 33     Prior to Admission medications   Medication Sig Start Date End Date Taking? Authorizing Provider  acetaminophen (TYLENOL) 500 MG tablet Take 2 tablets (1,000 mg total) by mouth  every 8 (eight) hours as needed for moderate pain. 12/29/22   Zigmund Daniel., MD  acyclovir (ZOVIRAX) 400 MG tablet Take 1 tablet (400 mg total) by mouth 2 (two) times daily. 01/04/23   Johney Maine, MD  brimonidine (ALPHAGAN) 0.2 % ophthalmic solution Place 1 drop into both eyes 3 (three) times daily.  01/17/15   [provider]  Cyanocobalamin (VITAMIN B-12) 2500 MCG SUBL Place 2,500 mcg under the tongue daily. Patient not taking: Reported on 12/06/2022    [provider]  ferrous sulfate 325 (65 FE) MG tablet Take 325 mg by mouth every other day. Patient not taking:  Reported on 12/06/2022 07/18/22   [provider]  folic acid (FOLVITE) 1 MG tablet Take 1 tablet (1 mg total) by mouth daily. 12/30/22   Zigmund Daniel., MD  insulin aspart (NOVOLOG) 100 UNIT/ML injection Inject 0-6 Units into the skin 3 (three) times daily with meals. CBG < 70: treat low blood sugar CBG 70 - 120: 0 units CBG 121 - 150: 0 units CBG 151 - 200: 1 unit CBG 201-250: 2 units CBG 251-300: 3 units CBG 301-350: 4 units CBG 351-400: 5 units CBG > 400: Give 6 units and call MD 12/29/22   Zigmund Daniel., MD  latanoprost (XALATAN) 0.005 % ophthalmic solution Place 1 drop into both eyes every evening.    [provider]  levothyroxine (SYNTHROID, LEVOTHROID) 150 MCG tablet Take 150 mcg by mouth daily. 12/10/14   [provider]  Magnesium Oxide 400 MG CAPS Take 400 mg by mouth daily. 09/19/22   [provider]  metoprolol tartrate (LOPRESSOR) 50 MG tablet Take 1 tablet (50 mg total) by mouth 2 (two) times daily. 12/29/22   Zigmund Daniel., MD  ondansetron (ZOFRAN) 8 MG tablet Take 1 tablet (8 mg total) by mouth every 8 (eight) hours as needed for nausea or vomiting. Patient not taking: Reported on 01/16/2023 01/04/23   Johney Maine, MD  pantoprazole (PROTONIX) 40 MG tablet Take 1 tablet (40 mg total) by mouth daily. Patient not taking: Reported on 01/16/2023 12/29/22   Zigmund Daniel., MD  polyethylene glycol (MIRALAX) 17 g packet Take 17 g by mouth daily. 10/05/22   Johney Maine, MD  rosuvastatin (CRESTOR) 10 MG tablet Take 10 mg by mouth every evening. Patient not taking: Reported on 12/06/2022    [provider]  sodium bicarbonate 650 MG tablet Take 2 tablets (1,300 mg total) by mouth 3 (three) times daily. 12/29/22   Zigmund Daniel., MD  tamsulosin (FLOMAX) 0.4 MG CAPS capsule Take 0.4 mg by mouth daily. Patient not taking: Reported on 12/06/2022 08/22/22   [provider]    Physical  Exam: Vitals:   01/26/23 1551 01/26/23 1651 01/26/23 1713  BP:  105/61   Pulse:  90   Resp:  16   Temp:   98.1 F (36.7 C)  TempSrc:   Oral  SpO2:  99%   Weight: 68.4 kg    Height: 5\' 6"  (1.676 m)      Constitutional: NAD, calm, comfortable.  Patient is blind, he can only see waving of the hands. Vitals:   01/26/23 1551 01/26/23 1651 01/26/23 1713  BP:  105/61   Pulse:  90   Resp:  16   Temp:   98.1 F (36.7 C)  TempSrc:   Oral  SpO2:  99%   Weight: 68.4 kg    Height:  5\' 6"  (1.676 m)     Eyes: PERRL, lids and conjunctivae normal ENMT: Mucous membranes are dry. Posterior pharynx clear of any exudate or lesions.Normal dentition.  Neck: normal, supple, no masses, no thyromegaly Respiratory: clear to auscultation bilaterally, no wheezing, no crackles. Normal respiratory effort. No accessory muscle use.  Cardiovascular: Regular rate and rhythm, no murmurs / rubs / gallops. No extremity edema. 2+ pedal pulses. No carotid bruits.  Abdomen: no tenderness, no masses palpated. No hepatosplenomegaly. Bowel sounds positive.  Multiple injection marks with Band-Aid. Musculoskeletal: no clubbing / cyanosis. No joint deformity upper and lower extremities. Good ROM, no contractures. Normal muscle tone.  Skin: no rashes, lesions, ulcers. No induration Neurologic: CN 2-12 grossly intact. Sensation intact, DTR normal. Strength 5/5 in all 4.  Psychiatric: Normal judgment and insight. Alert and oriented x 3. Normal mood.     Labs on Admission: I have personally reviewed following labs and imaging studies  CBC: Recent Labs  Lab 01/23/23 0856 01/26/23 1805  WBC 5.0 4.7  NEUTROABS 3.5 2.8  HGB 8.1* 7.3*  HCT 24.9* 22.8*  MCV 92.6 92.7  PLT 165 119*   Basic Metabolic Panel: Recent Labs  Lab 01/23/23 0856 01/26/23 1805  NA 137 135  K 3.3* 2.6*  CL 98 98  CO2 31 23  GLUCOSE 130* 176*  BUN 14 31*  CREATININE 1.00 2.05*  CALCIUM 8.0* 7.4*   GFR: Estimated Creatinine Clearance:  29.8 mL/min (A) (by C-G formula based on SCr of 2.05 mg/dL (H)). Liver Function Tests: Recent Labs  Lab 01/23/23 0856 01/26/23 1805  AST 9* 18  ALT 9 14  ALKPHOS 177* 135*  BILITOT 1.2 0.8  PROT 5.5* 5.1*  ALBUMIN 2.8* 2.4*   Recent Labs  Lab 01/26/23 1805  LIPASE 32   No results for input(s): "AMMONIA" in the last 168 hours. Coagulation Profile: No results for input(s): "INR", "PROTIME" in the last 168 hours. Cardiac Enzymes: No results for input(s): "CKTOTAL", "CKMB", "CKMBINDEX", "TROPONINI" in the last 168 hours. BNP (last 3 results) No results for input(s): "PROBNP" in the last 8760 hours. HbA1C: No results for input(s): "HGBA1C" in the last 72 hours. CBG: Recent Labs  Lab 01/26/23 1713  GLUCAP 178*   Lipid Profile: No results for input(s): "CHOL", "HDL", "LDLCALC", "TRIG", "CHOLHDL", "LDLDIRECT" in the last 72 hours. Thyroid Function Tests: No results for input(s): "TSH", "T4TOTAL", "FREET4", "T3FREE", "THYROIDAB" in the last 72 hours. Anemia Panel: No results for input(s): "VITAMINB12", "FOLATE", "FERRITIN", "TIBC", "IRON", "RETICCTPCT" in the last 72 hours. Urine analysis:    Component Value Date/Time   COLORURINE YELLOW 12/04/2022 0856   APPEARANCEUR HAZY (A) 12/04/2022 0856   LABSPEC 1.012 12/04/2022 0856   PHURINE 5.0 12/04/2022 0856   GLUCOSEU NEGATIVE 12/04/2022 0856   HGBUR NEGATIVE 12/04/2022 0856   BILIRUBINUR NEGATIVE 12/04/2022 0856   BILIRUBINUR small (A) 11/22/2015 0929   KETONESUR NEGATIVE 12/04/2022 0856   PROTEINUR 30 (A) 12/04/2022 0856   UROBILINOGEN 0.2 11/22/2015 0929   UROBILINOGEN 1.0 02/17/2015 0021   NITRITE NEGATIVE 12/04/2022 0856   LEUKOCYTESUR NEGATIVE 12/04/2022 0856    Radiological Exams on Admission: DG Pelvis 1-2 Views  Result Date: 01/26/2023 CLINICAL DATA:  Status post recent fall. EXAM: PELVIS - 1-2 VIEW COMPARISON:  None Available. FINDINGS: There is no evidence of pelvic fracture or diastasis. Moderate severity  degenerative changes are seen involving both hips, in the form of joint space narrowing, acetabular sclerosis and lateral acetabular bony spurring. No pelvic bone lesions are seen.  IMPRESSION: Moderate severity degenerative changes without an acute osseous abnormality. Electronically Signed   By: Aram Candela M.D.   On: 01/26/2023 19:12   DG Chest 2 View  Result Date: 01/26/2023 CLINICAL DATA:  Weakness, hypotension and recent falls. EXAM: CHEST - 2 VIEW COMPARISON:  December 27, 2022 FINDINGS: The heart size and mediastinal contours are within normal limits. There is marked severity calcification of the aortic arch. Both lungs are clear. Multilevel degenerative changes seen throughout the thoracic spine. IMPRESSION: No active cardiopulmonary disease. Electronically Signed   By: Aram Candela M.D.   On: 01/26/2023 19:09   CT Head Wo Contrast  Result Date: 01/26/2023 CLINICAL DATA:  Polytrauma, blunt.  History of multiple myeloma EXAM: CT HEAD WITHOUT CONTRAST CT CERVICAL SPINE WITHOUT CONTRAST TECHNIQUE: Multidetector CT imaging of the head and cervical spine was performed following the standard protocol without intravenous contrast. Multiplanar CT image reconstructions of the cervical spine were also generated. RADIATION DOSE REDUCTION: This exam was performed according to the departmental dose-optimization program which includes automated exposure control, adjustment of the mA and/or kV according to patient size and/or use of iterative reconstruction technique. COMPARISON:  12/04/2022 FINDINGS: CT HEAD FINDINGS Brain: No evidence of acute infarction, hemorrhage, hydrocephalus, extra-axial collection or mass lesion/mass effect. Vascular: Atherosclerotic calcifications involving the large vessels of the skull base. No unexpected hyperdense vessel. Skull: Redemonstration of scattered lucent lesions throughout the bony calvarium. No acute calvarial fracture. Sinuses/Orbits: No acute finding. Other:  Negative for scalp hematoma. CT CERVICAL SPINE FINDINGS Alignment: Facet joints are aligned without dislocation or traumatic listhesis. Dens and lateral masses are aligned. Skull base and vertebrae: No acute fracture is identified. Widespread lytic lesions throughout the visualized osseous structures compatible with known multiple myeloma. Soft tissues and spinal canal: No prevertebral fluid or swelling. No visible canal hematoma. Disc levels: Bulky bridging anterior endplate osteophytes spanning from C2 to C6. Relative preservation of the disc heights. No advanced facet arthropathy. Upper chest: Included lung apices are clear. Other: Bilateral carotid atherosclerosis. IMPRESSION: 1. No acute intracranial abnormality. 2. No acute fracture or subluxation of the cervical spine. 3. Widespread lytic lesions throughout the visualized osseous structures compatible with known multiple myeloma. Electronically Signed   By: Duanne Guess D.O.   On: 01/26/2023 18:53   CT Cervical Spine Wo Contrast  Result Date: 01/26/2023 CLINICAL DATA:  Polytrauma, blunt.  History of multiple myeloma EXAM: CT HEAD WITHOUT CONTRAST CT CERVICAL SPINE WITHOUT CONTRAST TECHNIQUE: Multidetector CT imaging of the head and cervical spine was performed following the standard protocol without intravenous contrast. Multiplanar CT image reconstructions of the cervical spine were also generated. RADIATION DOSE REDUCTION: This exam was performed according to the departmental dose-optimization program which includes automated exposure control, adjustment of the mA and/or kV according to patient size and/or use of iterative reconstruction technique. COMPARISON:  12/04/2022 FINDINGS: CT HEAD FINDINGS Brain: No evidence of acute infarction, hemorrhage, hydrocephalus, extra-axial collection or mass lesion/mass effect. Vascular: Atherosclerotic calcifications involving the large vessels of the skull base. No unexpected hyperdense vessel. Skull:  Redemonstration of scattered lucent lesions throughout the bony calvarium. No acute calvarial fracture. Sinuses/Orbits: No acute finding. Other: Negative for scalp hematoma. CT CERVICAL SPINE FINDINGS Alignment: Facet joints are aligned without dislocation or traumatic listhesis. Dens and lateral masses are aligned. Skull base and vertebrae: No acute fracture is identified. Widespread lytic lesions throughout the visualized osseous structures compatible with known multiple myeloma. Soft tissues and spinal canal: No prevertebral fluid or swelling. No visible canal  hematoma. Disc levels: Bulky bridging anterior endplate osteophytes spanning from C2 to C6. Relative preservation of the disc heights. No advanced facet arthropathy. Upper chest: Included lung apices are clear. Other: Bilateral carotid atherosclerosis. IMPRESSION: 1. No acute intracranial abnormality. 2. No acute fracture or subluxation of the cervical spine. 3. Widespread lytic lesions throughout the visualized osseous structures compatible with known multiple myeloma. Electronically Signed   By: Duanne Guess D.O.   On: 01/26/2023 18:53    EKG: Independently reviewed.  Results pending.  Assessment/Plan Principal Problem:   Acute kidney injury (AKI) with acute tubular necrosis (ATN) (HCC) Active Problems:   Hypothyroidism   Diabetes mellitus type 2, controlled (HCC)   Essential hypertension   Multiple myeloma without remission (HCC)   Hypokalemia     1.  Acute kidney injury, moderate dehydration, failure to thrive, hypokalemia: Treated with isotonic fluid, recheck levels tomorrow morning.  This is likely prerenal given his dehydration.  Unlikely myeloma kidney. Replace potassium aggressively, given 40 IV and 40 p.o. now.  Recheck levels tomorrow morning.  Check magnesium and phosphorus levels. Especially after chemotherapy, he is likely to get dehydration and may benefit with IV fluids administration after chemotherapy.  2.  Fall ,  no skeletal injury: EMS reported poor living conditions.  Patient will need PT OT evaluation, social worker consultation, may need to evaluate living condition at home.  3.  Anemia of chronic disease, multiple myeloma on chemotherapy: Patient denies any complaints.  He is on chemotherapy weekly.  Followed by Dr. Candise Che. Hemoglobin 7.3, will recheck after hemodilution.  May benefit with transfusion if less than 7 in the morning.  4.  Chronic medical issues including Essential hypertension, stable Type 2 diabetes, on SSI   DVT prophylaxis: Heparin subcu Code Status: DNR with limited intervention, has documented conversation with palliative care team, patient agreed.  DNR in the chart. Family Communication: None at the bedside. Disposition Plan: Home once stable Consults called: None Admission status: Observation.   Dorcas Carrow MD [facility] Pager [phone]

## 2023-01-26 NOTE — ED Triage Notes (Signed)
Pt here due to weakness, hypotension and 2 falls today. Patient is A&O X 4. Per EMS, patient probably needs social work consult due to living situation. Pt has no complaints.

## 2023-01-27 DIAGNOSIS — F39 Unspecified mood [affective] disorder: Secondary | ICD-10-CM | POA: Diagnosis present

## 2023-01-27 DIAGNOSIS — Z79890 Hormone replacement therapy (postmenopausal): Secondary | ICD-10-CM | POA: Diagnosis not present

## 2023-01-27 DIAGNOSIS — R627 Adult failure to thrive: Secondary | ICD-10-CM | POA: Diagnosis present

## 2023-01-27 DIAGNOSIS — L89611 Pressure ulcer of right heel, stage 1: Secondary | ICD-10-CM | POA: Diagnosis present

## 2023-01-27 DIAGNOSIS — Z515 Encounter for palliative care: Secondary | ICD-10-CM

## 2023-01-27 DIAGNOSIS — W1830XA Fall on same level, unspecified, initial encounter: Secondary | ICD-10-CM | POA: Diagnosis present

## 2023-01-27 DIAGNOSIS — Y92009 Unspecified place in unspecified non-institutional (private) residence as the place of occurrence of the external cause: Secondary | ICD-10-CM | POA: Diagnosis not present

## 2023-01-27 DIAGNOSIS — Z7189 Other specified counseling: Secondary | ICD-10-CM

## 2023-01-27 DIAGNOSIS — E039 Hypothyroidism, unspecified: Secondary | ICD-10-CM | POA: Diagnosis present

## 2023-01-27 DIAGNOSIS — N170 Acute kidney failure with tubular necrosis: Secondary | ICD-10-CM

## 2023-01-27 DIAGNOSIS — I951 Orthostatic hypotension: Secondary | ICD-10-CM | POA: Diagnosis present

## 2023-01-27 DIAGNOSIS — E1122 Type 2 diabetes mellitus with diabetic chronic kidney disease: Secondary | ICD-10-CM | POA: Diagnosis present

## 2023-01-27 DIAGNOSIS — E860 Dehydration: Secondary | ICD-10-CM | POA: Diagnosis present

## 2023-01-27 DIAGNOSIS — H409 Unspecified glaucoma: Secondary | ICD-10-CM | POA: Diagnosis present

## 2023-01-27 DIAGNOSIS — D630 Anemia in neoplastic disease: Secondary | ICD-10-CM | POA: Diagnosis present

## 2023-01-27 DIAGNOSIS — I129 Hypertensive chronic kidney disease with stage 1 through stage 4 chronic kidney disease, or unspecified chronic kidney disease: Secondary | ICD-10-CM | POA: Diagnosis present

## 2023-01-27 DIAGNOSIS — E876 Hypokalemia: Secondary | ICD-10-CM | POA: Diagnosis present

## 2023-01-27 DIAGNOSIS — N179 Acute kidney failure, unspecified: Secondary | ICD-10-CM | POA: Diagnosis not present

## 2023-01-27 DIAGNOSIS — L89152 Pressure ulcer of sacral region, stage 2: Secondary | ICD-10-CM | POA: Diagnosis present

## 2023-01-27 DIAGNOSIS — E119 Type 2 diabetes mellitus without complications: Secondary | ICD-10-CM | POA: Diagnosis not present

## 2023-01-27 DIAGNOSIS — D6481 Anemia due to antineoplastic chemotherapy: Secondary | ICD-10-CM | POA: Diagnosis present

## 2023-01-27 DIAGNOSIS — Z1152 Encounter for screening for COVID-19: Secondary | ICD-10-CM | POA: Diagnosis not present

## 2023-01-27 DIAGNOSIS — C9000 Multiple myeloma not having achieved remission: Secondary | ICD-10-CM | POA: Diagnosis present

## 2023-01-27 DIAGNOSIS — Z66 Do not resuscitate: Secondary | ICD-10-CM | POA: Diagnosis present

## 2023-01-27 DIAGNOSIS — D696 Thrombocytopenia, unspecified: Secondary | ICD-10-CM | POA: Diagnosis present

## 2023-01-27 DIAGNOSIS — N189 Chronic kidney disease, unspecified: Secondary | ICD-10-CM | POA: Diagnosis present

## 2023-01-27 DIAGNOSIS — G4733 Obstructive sleep apnea (adult) (pediatric): Secondary | ICD-10-CM | POA: Diagnosis present

## 2023-01-27 DIAGNOSIS — Z794 Long term (current) use of insulin: Secondary | ICD-10-CM | POA: Diagnosis not present

## 2023-01-27 DIAGNOSIS — T451X5A Adverse effect of antineoplastic and immunosuppressive drugs, initial encounter: Secondary | ICD-10-CM | POA: Diagnosis not present

## 2023-01-27 DIAGNOSIS — E7800 Pure hypercholesterolemia, unspecified: Secondary | ICD-10-CM | POA: Diagnosis present

## 2023-01-27 LAB — BASIC METABOLIC PANEL
Anion gap: 10 (ref 5–15)
Anion gap: 11 (ref 5–15)
BUN: 29 mg/dL — ABNORMAL HIGH (ref 8–23)
BUN: 25 mg/dL — ABNORMAL HIGH (ref 8–23)
CO2: 27 mmol/L (ref 22–32)
CO2: 23 mmol/L (ref 22–32)
Calcium: 7.4 mg/dL — ABNORMAL LOW (ref 8.9–10.3)
Calcium: 7.5 mg/dL — ABNORMAL LOW (ref 8.9–10.3)
Chloride: 101 mmol/L (ref 98–111)
Chloride: 104 mmol/L (ref 98–111)
Creatinine, Ser: 1.66 mg/dL — ABNORMAL HIGH (ref 0.61–1.24)
Creatinine, Ser: 1.56 mg/dL — ABNORMAL HIGH (ref 0.61–1.24)
GFR, Estimated: 44 mL/min — ABNORMAL LOW (ref >60)
GFR, Estimated: 47 mL/min — ABNORMAL LOW (ref >60)
Glucose, Bld: 133 mg/dL — ABNORMAL HIGH (ref 70–99)
Glucose, Bld: 184 mg/dL — ABNORMAL HIGH (ref 70–99)
Potassium: 2.2 mmol/L — CL (ref 3.5–5.1)
Potassium: 2.9 mmol/L — ABNORMAL LOW (ref 3.5–5.1)
Sodium: 138 mmol/L (ref 135–145)
Sodium: 138 mmol/L (ref 135–145)

## 2023-01-27 LAB — CBC WITH DIFFERENTIAL/PLATELET
Abs Immature Granulocytes: 0.02 10*3/uL (ref 0.00–0.07)
Basophils Absolute: 0 10*3/uL (ref 0.0–0.1)
Basophils Relative: 0 %
Eosinophils Absolute: 0 10*3/uL (ref 0.0–0.5)
Eosinophils Relative: 0 %
HCT: 20.7 % — ABNORMAL LOW (ref 39.0–52.0)
Hemoglobin: 6.4 g/dL — CL (ref 13.0–17.0)
Immature Granulocytes: 1 %
Lymphocytes Relative: 27 %
Lymphs Abs: 1 10*3/uL (ref 0.7–4.0)
MCH: 29.6 pg (ref 26.0–34.0)
MCHC: 30.9 g/dL (ref 30.0–36.0)
MCV: 95.8 fL (ref 80.0–100.0)
Monocytes Absolute: 0.6 10*3/uL (ref 0.1–1.0)
Monocytes Relative: 15 %
Neutro Abs: 2.3 10*3/uL (ref 1.7–7.7)
Neutrophils Relative %: 57 %
Platelets: 98 10*3/uL — ABNORMAL LOW (ref 150–400)
RBC: 2.16 MIL/uL — ABNORMAL LOW (ref 4.22–5.81)
RDW: 17.5 % — ABNORMAL HIGH (ref 11.5–15.5)
WBC: 3.9 10*3/uL — ABNORMAL LOW (ref 4.0–10.5)
nRBC: 0 % (ref 0.0–0.2)

## 2023-01-27 LAB — URINALYSIS, ROUTINE W REFLEX MICROSCOPIC
Bilirubin Urine: NEGATIVE
Glucose, UA: NEGATIVE mg/dL
Hgb urine dipstick: NEGATIVE
Ketones, ur: NEGATIVE mg/dL
Leukocytes,Ua: NEGATIVE
Nitrite: NEGATIVE
Protein, ur: NEGATIVE mg/dL
Specific Gravity, Urine: 1.006 (ref 1.005–1.030)
pH: 7 (ref 5.0–8.0)

## 2023-01-27 LAB — CBC
HCT: 23.5 % — ABNORMAL LOW (ref 39.0–52.0)
Hemoglobin: 7.6 g/dL — ABNORMAL LOW (ref 13.0–17.0)
MCH: 30.8 pg (ref 26.0–34.0)
MCHC: 32.3 g/dL (ref 30.0–36.0)
MCV: 95.1 fL (ref 80.0–100.0)
Platelets: 95 10*3/uL — ABNORMAL LOW (ref 150–400)
RBC: 2.47 MIL/uL — ABNORMAL LOW (ref 4.22–5.81)
RDW: 17 % — ABNORMAL HIGH (ref 11.5–15.5)
WBC: 4.8 10*3/uL (ref 4.0–10.5)
nRBC: 0 % (ref 0.0–0.2)

## 2023-01-27 LAB — MAGNESIUM
Magnesium: 2.3 mg/dL (ref 1.7–2.4)
Magnesium: 2.3 mg/dL (ref 1.7–2.4)

## 2023-01-27 LAB — PHOSPHORUS: Phosphorus: 2.7 mg/dL (ref 2.5–4.6)

## 2023-01-27 LAB — GLUCOSE, CAPILLARY
Glucose-Capillary: 115 mg/dL — ABNORMAL HIGH (ref 70–99)
Glucose-Capillary: 125 mg/dL — ABNORMAL HIGH (ref 70–99)
Glucose-Capillary: 147 mg/dL — ABNORMAL HIGH (ref 70–99)
Glucose-Capillary: 152 mg/dL — ABNORMAL HIGH (ref 70–99)
Glucose-Capillary: 163 mg/dL — ABNORMAL HIGH (ref 70–99)

## 2023-01-27 LAB — TROPONIN I (HIGH SENSITIVITY): Troponin I (High Sensitivity): 32 ng/L — ABNORMAL HIGH (ref <18)

## 2023-01-27 LAB — PREPARE RBC (CROSSMATCH)

## 2023-01-27 MED ORDER — LATANOPROST 0.005 % OP SOLN
1.0000 [drp] | Freq: Every evening | OPHTHALMIC | Status: DC
  Administered 2023-01-27 – 2023-01-31 (×5): 1 [drp] via OPHTHALMIC
  Filled 2023-01-27: qty 2.5

## 2023-01-27 MED ORDER — INSULIN ASPART 100 UNIT/ML IJ SOLN
0.0000 [IU] | Freq: Three times a day (TID) | INTRAMUSCULAR | Status: DC
  Administered 2023-01-27: 2 [IU] via SUBCUTANEOUS
  Administered 2023-01-27: 1 [IU] via SUBCUTANEOUS
  Administered 2023-01-27 – 2023-01-28 (×2): 2 [IU] via SUBCUTANEOUS
  Administered 2023-01-28: 1 [IU] via SUBCUTANEOUS
  Administered 2023-01-29: 2 [IU] via SUBCUTANEOUS
  Administered 2023-01-29: 9 [IU] via SUBCUTANEOUS
  Administered 2023-01-30: 2 [IU] via SUBCUTANEOUS
  Administered 2023-01-31: 1 [IU] via SUBCUTANEOUS
  Administered 2023-01-31: 2 [IU] via SUBCUTANEOUS

## 2023-01-27 MED ORDER — POTASSIUM CHLORIDE CRYS ER 20 MEQ PO TBCR
40.0000 meq | EXTENDED_RELEASE_TABLET | Freq: Once | ORAL | Status: AC
  Administered 2023-01-27: 40 meq via ORAL
  Filled 2023-01-27: qty 2

## 2023-01-27 MED ORDER — SODIUM BICARBONATE 650 MG PO TABS
1300.0000 mg | ORAL_TABLET | Freq: Three times a day (TID) | ORAL | Status: DC
  Administered 2023-01-27 – 2023-02-01 (×16): 1300 mg via ORAL
  Filled 2023-01-27 (×16): qty 2

## 2023-01-27 MED ORDER — MAGNESIUM OXIDE -MG SUPPLEMENT 400 (240 MG) MG PO TABS
400.0000 mg | ORAL_TABLET | Freq: Every day | ORAL | Status: DC
  Administered 2023-01-27 – 2023-01-28 (×2): 400 mg via ORAL
  Filled 2023-01-27 (×3): qty 1

## 2023-01-27 MED ORDER — BRIMONIDINE TARTRATE 0.2 % OP SOLN
1.0000 [drp] | Freq: Three times a day (TID) | OPHTHALMIC | Status: DC
  Administered 2023-01-27 – 2023-02-01 (×16): 1 [drp] via OPHTHALMIC
  Filled 2023-01-27: qty 5

## 2023-01-27 MED ORDER — METOPROLOL TARTRATE 50 MG PO TABS
50.0000 mg | ORAL_TABLET | Freq: Two times a day (BID) | ORAL | Status: DC
  Administered 2023-01-27 – 2023-01-29 (×6): 50 mg via ORAL
  Filled 2023-01-27 (×6): qty 1

## 2023-01-27 MED ORDER — POTASSIUM CHLORIDE CRYS ER 20 MEQ PO TBCR
40.0000 meq | EXTENDED_RELEASE_TABLET | ORAL | Status: AC
  Administered 2023-01-27 (×2): 40 meq via ORAL
  Filled 2023-01-27 (×2): qty 2

## 2023-01-27 MED ORDER — SODIUM CHLORIDE 0.9% IV SOLUTION: Freq: Once | INTRAVENOUS | Status: AC

## 2023-01-27 MED ORDER — HEPARIN SODIUM (PORCINE) 5000 UNIT/ML IJ SOLN
5000.0000 [IU] | Freq: Three times a day (TID) | INTRAMUSCULAR | Status: DC
  Administered 2023-01-27: 5000 [IU] via SUBCUTANEOUS
  Filled 2023-01-27: qty 1

## 2023-01-27 MED ORDER — INSULIN ASPART 100 UNIT/ML IJ SOLN: 0.0000 [IU] | Freq: Every day | INTRAMUSCULAR | Status: DC

## 2023-01-27 MED ORDER — ORAL CARE MOUTH RINSE: 15.0000 mL | OROMUCOSAL | Status: DC | PRN

## 2023-01-27 MED ORDER — POTASSIUM CHLORIDE 10 MEQ/100ML IV SOLN
10.0000 meq | INTRAVENOUS | Status: AC
  Administered 2023-01-27 (×2): 10 meq via INTRAVENOUS
  Filled 2023-01-27 (×2): qty 100

## 2023-01-27 MED ORDER — SODIUM CHLORIDE 0.9 % IV SOLN: INTRAVENOUS | Status: DC

## 2023-01-27 MED ORDER — POTASSIUM CHLORIDE 20 MEQ PO PACK
60.0000 meq | PACK | Freq: Two times a day (BID) | ORAL | Status: DC
  Administered 2023-01-27: 60 meq via ORAL
  Filled 2023-01-27: qty 3

## 2023-01-27 MED ORDER — ACETAMINOPHEN 500 MG PO TABS: 1000.0000 mg | ORAL_TABLET | Freq: Three times a day (TID) | ORAL | Status: DC | PRN

## 2023-01-27 NOTE — Hospital Course (Signed)
Brief hospital course: PMH of blindness, GERD, HLD, HTN, hypothyroidism podiatry, multiple myeloma on chemotherapy last session 9/17 presented to hospital with a fall.  Found to have anemia, hypokalemia, thrombocytopenia as well as AKI. Currently treated for orthostatic hypotension.  Assessment and Plan: AKI Baseline creatinine around 1. On presentation serum creatinine 2.05 with elevated BUN. Most likely in the setting of poor p.o. intake. Responded very well to IV hydration with normal serum creatinine night now. Monitor  Essential hypertension. Orthostatic hypotension. On metoprolol at home. Blood pressure was stable in supine position but on standing significantly dropped. Responding well to IV hydration. Will continue IV fluid overnight. Metoprolol on hold. Started on Florinef.  Multiple myeloma on chemotherapy. Chemotherapy and multiple myeloma induced anemia. SP blood transfusion.  Monitor H&H.  Transfuse 7 or hemodynamic instability. Prognosis poor. Oncology following.  Monitor.   Hypokalemia  Replaced.  Chronic blindness Noted.  Unwitnessed fall  No evidence of injury  CT of head and neck unremarkable  PT OT consulted.  Recommendation is for SNF although patient would like to go home.  Goals of care  currently DNR/DNI  Palliative care following.  Appreciate assistance.  Mood disorder. Patient has reported to RN as well as PT staff that he is feeling depressed. Palliative care is actually following the patient. We discussed with regards to initiating therapy with Remeron.

## 2023-01-27 NOTE — Progress Notes (Signed)
PT Cancellation Note  Patient Details Name: Scott Bass MRN: [id number] DOB: [birth year]   Cancelled Treatment:     PT order received but eval deferred this am, pt with Hgb 6.4 with transfusion ordered.  Will follow.   [name] 01/27/2023, 7:27 AM

## 2023-01-27 NOTE — Progress Notes (Signed)
Patient Name: Scott Bass           DOB: [birth year]  MRN: [id number]      Admission Date: 01/26/2023  Attending Provider: Rolly Salter, MD  Primary Diagnosis: Acute kidney injury (AKI) with acute tubular necrosis (ATN) (HCC)   Level of care: Telemetry    CROSS COVER NOTE   Date of Service   01/27/2023   Scott Bass, 72 y.o. male, was admitted on 01/26/2023 for Acute kidney injury (AKI) with acute tubular necrosis (ATN) (HCC).    HPI/Events of Note   Anemia of Chronic Disease  Hemoglobin 7.3 -->  6.4.  Baseline hemoglobin ~ 8-9 No acute changes reported.  Hemodynamically stable. No melena, hematochezia, or other bleeding reported tonight.    Interventions/ Plan   Blood transfusion, 1 unit PRBC        Anthoney Harada, DNP, [employer] [facility] [place]

## 2023-01-27 NOTE — Plan of Care (Signed)
  Problem: Health Behavior/Discharge Planning: Goal: Ability to identify and utilize available resources and services will improve Outcome: Progressing Goal: Ability to manage health-related needs will improve Outcome: Progressing   Problem: Metabolic: Goal: Ability to maintain appropriate glucose levels will improve Outcome: Progressing   Problem: Health Behavior/Discharge Planning: Goal: Ability to manage health-related needs will improve Outcome: Progressing   Problem: Coping: Goal: Level of anxiety will decrease Outcome: Progressing   Problem: Pain Managment: Goal: General experience of comfort will improve Outcome: Progressing

## 2023-01-27 NOTE — Consult Note (Signed)
Palliative Care Consult Note                                  Date: 01/27/2023   Patient Name: Scott Bass  DOB: [birth year]  MRN: [id number]  Age / Sex: 72 y.o., male  PCP: Tracey Harries, MD Referring Physician: Rolly Salter, MD  Reason for Consultation: {Reason for Consult:23484}  HPI/Patient Profile: 72 y.o. male  with past medical history of legally blind, CKD, GERD, OSA, hypertension, diabetes type 2, and recent diagnosis of multiple myeloma on chemotherapy (last treatment on 9/17).  He presented to the ED on 01/26/2023 after a fall at home.  He was admitted with AKI, dehydration, failure to thrive, and hypokalemia.  Past Medical History:  Diagnosis Date   Arthritis    "lower back" (12/18/2017)   Better eye: moderate vision impairment; lesser eye: blind    GERD (gastroesophageal reflux disease)    Glaucoma, both eyes    High cholesterol    Hypertension    Hypothyroidism    Irregular heartbeat    Legally blind    "both eyes; can see some out of left eye"   OSA (obstructive sleep apnea)    Thyroid disease    Type II diabetes mellitus (HCC)    Vitamin B12 deficiency     Subjective:   I have reviewed medical records including EPIC notes, labs and imaging, received report from the team, and assessed the patient at bedside.   I met with *** to discuss diagnosis, prognosis, GOC, EOL wishes, disposition, and options.  Patient is well-known to PMT from previous hospitalization.  I re-introduced Palliative Medicine as specialized medical care for people living with serious illness. It focuses on providing relief from the symptoms and stress of a serious illness.   We discussed patient's current illness and what it means in the larger context of his/her ongoing co-morbidities. Current clinical status was reviewed. Natural disease trajectory of *** was discussed.  Created space and opportunity for patient and family to explore  thoughts and feelings regarding current medical situation.  Values and goals of care important to patient and family were attempted to be elicited.  A discussion was had today regarding advanced directives. Concepts specific to code status, artifical feeding and hydration, continued IV antibiotics and rehospitalization was had.  The MOST form was introduced and discussed.  Questions and concerns addressed. Patient/family encouraged to call with questions or concerns.     Life Review: ***  Functional Status: ***  Patient/Family Understanding of Illness: ***  Patient Values: ***  Goals: ***  Additional Discussion: ***  Review of Systems  Objective:   Primary Diagnoses: Present on Admission:  Acute kidney injury (AKI) with acute tubular necrosis (ATN) (HCC)  Hypothyroidism  Essential hypertension  Multiple myeloma without remission (HCC)  Hypokalemia   Physical Exam  Vital Signs:  BP 114/66 (BP Location: Right Arm)   Pulse 65   Temp 97.7 F (36.5 C) (Oral)   Resp 19   Ht 5\' 6"  (1.676 m)   Wt 64.4 kg   SpO2 100%   BMI 22.92 kg/m   Palliative Assessment/Data: ***     Assessment & Plan:   SUMMARY OF RECOMMENDATIONS   Continue current interventions - treat the treatable Patient and wife understand that his multiple myeloma is not curable, but they are hopeful to continue treatment for as long as possible   Primary Decision Maker:  PATIENT  Code Status/Advance Care Planning: DNR - limited  Symptom Management:  ***  Prognosis:  {Palliative Care Prognosis:23504}  Discharge Planning:  {Palliative dispostion:23505}   Discussed with: ***    Thank you for allowing [place] to participate in the care of Scott Bass   Time Total: ***  Greater than 50%  of this time was spent counseling and coordinating care related to the above assessment and plan.  Signed by: Sherlean Foot, NP Palliative Medicine Team  Team Phone # [phone]  For  individual providers, please see AMION

## 2023-01-27 NOTE — Progress Notes (Signed)
[facility] Progress Note Patient: Scott Bass LOV:[id number] DOB: 06-17-1950 DOA: 01/26/2023  DOS: the patient was seen and examined on 01/27/2023  Brief hospital course: PMH of blindness, GERD, HLD, HTN, hypothyroidism podiatry, multiple myeloma on chemotherapy last session 9/17 presented to hospital with a fall.  Found to have anemia, hypokalemia, thrombocytopenia as well as AKI.  Assessment and Plan: AKI Baseline creatinine around 1. On presentation serum creatinine 2.05 with elevated BUN. Treated with IV hydration with improvement. Monitor prevention  Hypokalemia  Currently treated. Monitor  Multiple myeloma on chemotherapy. Chemotherapy and multiple myeloma induced anemia. SP blood transfusion. Prognosis poor. Monitor.  Chronic blindness Noted.  Unwitnessed fall but No evidence of injury but CT of head and neck unremarkable per PT OT consulted.  Goals of care  currently DNR/DNI  Palliative care following.  Appreciate assistance.    Subjective: No nausea vomiting fever no chills or  Physical Exam: General: in Mild distress, No Rash Cardiovascular: S1 and S2 Present, No Murmur Respiratory: Good respiratory effort, Bilateral Air entry present. No Crackles, No wheezes Abdomen: Bowel Sound present, No tenderness Extremities: No edema Neuro: Alert and oriented x3, no new focal deficit  Data Reviewed: I have Reviewed nursing notes, Vitals, and Lab results. Since last encounter, pertinent lab results CBC and BMP   . I have ordered test including CBC and BMP  .  Discussed with palliative care  Disposition: Status is: Inpatient Remains inpatient appropriate because: Awaiting improvement in H&H   Family Communication: No one at bedside Level of care: Telemetry   Vitals:   01/27/23 1115 01/27/23 1146 01/27/23 1345 01/27/23 1415  BP: (!) 93/57 105/61 119/72 107/60  Pulse: 68 70 65 (!) 59  Resp:  18    Temp: 98.1 F (36.7 C) 98.9 F (37.2 C) 98.5 F  (36.9 C) (!) 97.5 F (36.4 C)  TempSrc: Oral Oral Oral Oral  SpO2: 100% 100% 99% 100%  Weight:      Height:         Author: Lynden Oxford, MD 01/27/2023 7:58 PM  Please look on [email or web address] to find out who is on call.

## 2023-01-28 ENCOUNTER — Inpatient Hospital Stay (HOSPITAL_COMMUNITY): Payer: Medicare PPO

## 2023-01-28 DIAGNOSIS — C9000 Multiple myeloma not having achieved remission: Secondary | ICD-10-CM | POA: Diagnosis not present

## 2023-01-28 DIAGNOSIS — Z7189 Other specified counseling: Secondary | ICD-10-CM | POA: Diagnosis not present

## 2023-01-28 DIAGNOSIS — N170 Acute kidney failure with tubular necrosis: Secondary | ICD-10-CM | POA: Diagnosis not present

## 2023-01-28 DIAGNOSIS — Z515 Encounter for palliative care: Secondary | ICD-10-CM | POA: Diagnosis not present

## 2023-01-28 LAB — URINE CULTURE: Culture: NO GROWTH

## 2023-01-28 LAB — CBC WITH DIFFERENTIAL/PLATELET
Abs Immature Granulocytes: 0.03 10*3/uL (ref 0.00–0.07)
Basophils Absolute: 0 10*3/uL (ref 0.0–0.1)
Basophils Relative: 0 %
Eosinophils Absolute: 0 10*3/uL (ref 0.0–0.5)
Eosinophils Relative: 0 %
HCT: 25 % — ABNORMAL LOW (ref 39.0–52.0)
Hemoglobin: 7.9 g/dL — ABNORMAL LOW (ref 13.0–17.0)
Immature Granulocytes: 1 %
Lymphocytes Relative: 20 %
Lymphs Abs: 1.2 10*3/uL (ref 0.7–4.0)
MCH: 29.7 pg (ref 26.0–34.0)
MCHC: 31.6 g/dL (ref 30.0–36.0)
MCV: 94 fL (ref 80.0–100.0)
Monocytes Absolute: 0.5 10*3/uL (ref 0.1–1.0)
Monocytes Relative: 9 %
Neutro Abs: 4.1 10*3/uL (ref 1.7–7.7)
Neutrophils Relative %: 70 %
Platelets: 100 10*3/uL — ABNORMAL LOW (ref 150–400)
RBC: 2.66 MIL/uL — ABNORMAL LOW (ref 4.22–5.81)
RDW: 17 % — ABNORMAL HIGH (ref 11.5–15.5)
WBC: 5.8 10*3/uL (ref 4.0–10.5)
nRBC: 0.3 % — ABNORMAL HIGH (ref 0.0–0.2)

## 2023-01-28 LAB — COMPREHENSIVE METABOLIC PANEL
ALT: 12 U/L (ref 0–44)
AST: 10 U/L — ABNORMAL LOW (ref 15–41)
Albumin: 2.3 g/dL — ABNORMAL LOW (ref 3.5–5.0)
Alkaline Phosphatase: 109 U/L (ref 38–126)
Anion gap: 8 (ref 5–15)
BUN: 22 mg/dL (ref 8–23)
CO2: 26 mmol/L (ref 22–32)
Calcium: 7.9 mg/dL — ABNORMAL LOW (ref 8.9–10.3)
Chloride: 107 mmol/L (ref 98–111)
Creatinine, Ser: 1.14 mg/dL (ref 0.61–1.24)
GFR, Estimated: >60 mL/min (ref >60)
Glucose, Bld: 125 mg/dL — ABNORMAL HIGH (ref 70–99)
Potassium: 4.1 mmol/L (ref 3.5–5.1)
Sodium: 141 mmol/L (ref 135–145)
Total Bilirubin: 1 mg/dL (ref 0.3–1.2)
Total Protein: 4.7 g/dL — ABNORMAL LOW (ref 6.5–8.1)

## 2023-01-28 LAB — GLUCOSE, CAPILLARY
Glucose-Capillary: 118 mg/dL — ABNORMAL HIGH (ref 70–99)
Glucose-Capillary: 137 mg/dL — ABNORMAL HIGH (ref 70–99)
Glucose-Capillary: 199 mg/dL — ABNORMAL HIGH (ref 70–99)
Glucose-Capillary: 108 mg/dL — ABNORMAL HIGH (ref 70–99)

## 2023-01-28 LAB — MAGNESIUM: Magnesium: 2.2 mg/dL (ref 1.7–2.4)

## 2023-01-28 MED ORDER — LEVOTHYROXINE SODIUM 75 MCG PO TABS
150.0000 ug | ORAL_TABLET | Freq: Every day | ORAL | Status: DC
  Administered 2023-01-28 – 2023-02-01 (×5): 150 ug via ORAL
  Filled 2023-01-28 (×5): qty 2

## 2023-01-28 MED ORDER — PANTOPRAZOLE SODIUM 40 MG PO TBEC
40.0000 mg | DELAYED_RELEASE_TABLET | Freq: Every day | ORAL | Status: DC
  Administered 2023-01-28 – 2023-02-01 (×5): 40 mg via ORAL
  Filled 2023-01-28 (×5): qty 1

## 2023-01-28 MED ORDER — TAMSULOSIN HCL 0.4 MG PO CAPS
0.4000 mg | ORAL_CAPSULE | Freq: Every day | ORAL | Status: DC
  Administered 2023-01-28 – 2023-01-29 (×2): 0.4 mg via ORAL
  Filled 2023-01-28 (×2): qty 1

## 2023-01-28 MED ORDER — FERROUS SULFATE 325 (65 FE) MG PO TABS
325.0000 mg | ORAL_TABLET | ORAL | Status: DC
  Administered 2023-01-28 – 2023-02-01 (×3): 325 mg via ORAL
  Filled 2023-01-28 (×4): qty 1

## 2023-01-28 MED ORDER — ENSURE ENLIVE PO LIQD
237.0000 mL | Freq: Three times a day (TID) | ORAL | Status: DC
  Administered 2023-01-28 – 2023-01-29 (×3): 237 mL via ORAL

## 2023-01-28 MED ORDER — ENSURE ENLIVE PO LIQD: 237.0000 mL | Freq: Two times a day (BID) | ORAL | Status: DC

## 2023-01-28 MED ORDER — MIRTAZAPINE 15 MG PO TABS
7.5000 mg | ORAL_TABLET | Freq: Every day | ORAL | Status: DC
  Administered 2023-01-28 – 2023-01-31 (×4): 7.5 mg via ORAL
  Filled 2023-01-28 (×4): qty 1

## 2023-01-28 MED ORDER — MAGNESIUM OXIDE -MG SUPPLEMENT 400 (240 MG) MG PO TABS
400.0000 mg | ORAL_TABLET | Freq: Every day | ORAL | Status: DC
  Administered 2023-01-29 – 2023-02-01 (×4): 400 mg via ORAL
  Filled 2023-01-28 (×5): qty 1

## 2023-01-28 MED ORDER — POLYETHYLENE GLYCOL 3350 17 G PO PACK
17.0000 g | PACK | Freq: Every day | ORAL | Status: DC
  Administered 2023-01-28 – 2023-01-30 (×3): 17 g via ORAL
  Filled 2023-01-28 (×4): qty 1

## 2023-01-28 NOTE — TOC Initial Note (Signed)
Transition of Care [facility]) - Initial/Assessment Note    Patient Details  Name: Scott Bass MRN: [id number] Date of Birth: 19-May-1950  Transition of [facility]) CM/SW Contact:    Adrian Prows, RN Phone Number: 01/28/2023, 3:23 PM  Clinical Narrative:                 [facility] consult for d/c planning; pt is blind; spoke w/ him in room; pt says he is from home and plans to return at d/c; he identified POC wife Datrell Jafri [phone]); pt says he uses [employer] or [employer] for transportation; he denies SDOH risks; pt says he does not have a difficult time getting his medication; pt verifies he has insurance and PCP; he has a walker, and BSC; he is not sure if there are grab bars in shower; pt says he does not have HH services or home oxygen; awaiting PT/OT eval; TOC will follow.  Expected Discharge Plan: Home/Self Care Barriers to Discharge: Continued Medical Work up   Patient Goals and CMS Choice Patient states their goals for this hospitalization and ongoing recovery are:: home          Expected Discharge Plan and Services   Discharge Planning Services: CM Consult   Living arrangements for the past 2 months: Single Family Home                                      Prior Living Arrangements/Services Living arrangements for the past 2 months: Single Family Home Lives with:: Spouse Patient language and need for interpreter reviewed:: Yes Do you feel safe going back to the place where you live?: Yes      Need for Family Participation in Patient Care: Yes (Comment) Care giver support system in place?: Yes (comment) Current home services: DME (walker, BSC) Criminal Activity/Legal Involvement Pertinent to Current Situation/Hospitalization: No - Comment as needed  Activities of Daily Living Home Assistive Devices/Equipment: Walker (specify type), CBG Meter ADL Screening (condition at time of admission) Patient's cognitive ability adequate to safely complete daily  activities?: Yes Is the patient deaf or have difficulty hearing?: No Does the patient have difficulty seeing, even when wearing glasses/contacts?: Yes Does the patient have difficulty concentrating, remembering, or making decisions?: No Patient able to express need for assistance with ADLs?: Yes Does the patient have difficulty dressing or bathing?: No Independently performs ADLs?: Yes (appropriate for developmental age) Does the patient have difficulty walking or climbing stairs?: No Weakness of Legs: Both Weakness of Arms/Hands: None  Permission Sought/Granted Permission sought to share information with : Case Manager Permission granted to share information with : Yes, Verbal Permission Granted  Share Information with NAME: Case Manager     Permission granted to share info w Relationship: Ruvim Zavatsky (spouse) [phone]     Emotional Assessment Appearance:: Appears stated age Attitude/Demeanor/Rapport: Gracious Affect (typically observed): Accepting Orientation: : Oriented to Self, Oriented to Place, Oriented to  Time, Oriented to Situation Alcohol / Substance Use: Not Applicable Psych Involvement: No (comment)  Admission diagnosis:  Hypokalemia [E87.6] AKI (acute kidney injury) (HCC) [N17.9] Acute kidney injury (AKI) with acute tubular necrosis (ATN) (HCC) [N17.0] Patient Active Problem List   Diagnosis Date Noted   Acute kidney injury (AKI) with acute tubular necrosis (ATN) (HCC) 01/26/2023   Hypokalemia 01/26/2023   Symptomatic anemia 12/16/2022   Malnutrition of moderate degree 12/09/2022   DNR (do not resuscitate) 12/07/2022  Acute delirium 12/07/2022   Need for emotional support 12/06/2022   Palliative care encounter 12/05/2022   Lactic acidosis 12/05/2022   Counseling and coordination of care 12/05/2022   Goals of care, counseling/discussion 12/05/2022   Acute renal failure (HCC) 12/05/2022   Acute on chronic renal failure (HCC) 12/04/2022   Multiple myeloma  without remission (HCC) 10/11/2022   Counseling regarding advance care planning and goals of care 10/11/2022   Hypercalcemia 09/25/2022   Claudication (HCC) 07/15/2018   Nonischemic cardiomyopathy (HCC) 07/15/2018   Abnormal stress test 12/17/2017   Frequent PVCs 12/17/2017   Pancreatitis 02/17/2015   Diabetes mellitus type 2, controlled (HCC) 02/17/2015   Essential hypertension 02/17/2015   Hypothyroidism 03/28/2007   HYPERLIPIDEMIA 03/28/2007   OBSTRUCTIVE SLEEP APNEA 03/28/2007   PCP:  Tracey Harries, MD Pharmacy:   [id number] [place], [place] - [address] AT [facility] [address] [place] [address] Phone: [phone] Fax: [phone]  [facility] - [place], [place] - [address] [place] [address] Phone: [phone] Fax: [phone]  [facility] - [place], [place] - [address] [place] [address] Phone: [phone] Fax: [phone]  [id number] - [place], [place] - [address] [place] [address] Phone: [phone] Fax: [phone]     Social Determinants of Health (SDOH) Social History: SDOH Screenings   Food Insecurity: No Food Insecurity (01/28/2023)  Recent Concern: Food Insecurity - Food Insecurity Present (12/06/2022)  Housing: Low Risk  (01/28/2023)  Transportation Needs: No Transportation Needs (01/28/2023)  Utilities: Not At Risk (01/28/2023)  Financial Resource Strain: Low Risk  (10/10/2022)   [facility]  Physical Activity: Insufficiently Active (12/23/2020)   [facility] from [facility], [facility]  Social Connections: Unknown (09/16/2021)   Received from [facility], [facility]  Stress: No Stress Concern Present (12/23/2020)   Received from [facility], [facility]  Tobacco Use: Low Risk  (01/26/2023)   SDOH  Interventions: Food Insecurity Interventions: Intervention Not Indicated, Inpatient TOC Housing Interventions: Intervention Not Indicated, Inpatient TOC Transportation Interventions: Intervention Not Indicated, Inpatient TOC Utilities Interventions: Intervention Not Indicated, Inpatient TOC   Readmission Risk Interventions    12/29/2022   11:05 AM 12/19/2022   11:42 AM 12/07/2022   11:22 AM  Readmission Risk Prevention Plan  Transportation Screening Complete Complete   Medication Review (RN Care Manager) Complete Complete   PCP or Specialist appointment within 3-5 days of discharge Complete Complete   HRI or Home Care Consult Complete Complete Complete  SW Recovery Care/Counseling Consult Complete Complete Complete  Palliative Care Screening Complete Not Applicable   Skilled Nursing Facility Complete Complete

## 2023-01-28 NOTE — Progress Notes (Signed)
[facility] Progress Note Patient: Scott Bass XBM:[id number] DOB: [birth year] DOA: 01/26/2023  DOS: the patient was seen and examined on 01/28/2023  Brief hospital course: PMH of blindness, GERD, HLD, HTN, hypothyroidism podiatry, multiple myeloma on chemotherapy last session 9/17 presented to hospital with a fall.  Found to have anemia, hypokalemia, thrombocytopenia as well as AKI.  Assessment and Plan: AKI Baseline creatinine around 1. On presentation serum creatinine 2.05 with elevated BUN. Treated with IV hydration with improvement.  Will stop fluid and monitor. Monitor  Hypokalemia  Currently treated. Monitor  Multiple myeloma on chemotherapy. Chemotherapy and multiple myeloma induced anemia. SP blood transfusion. Prognosis poor. Monitor.  Chronic blindness Noted.  Unwitnessed fall but No evidence of injury but CT of head and neck unremarkable per PT OT consulted.  Goals of care  currently DNR/DNI  Palliative care following.  Appreciate assistance.  Mood disorder. Patient has reported to RN as well as PT staff that he is feeling depressed. Palliative care is actually following the patient.  In discussing with the wife. Currently outpatient.  Referral has been placed. Will discussed with regards to initiating therapy with Remeron   Subjective: No nausea no vomiting no fever no chills.  Minimal oral intake.  Physical Exam: General: in Mild distress, No Rash Cardiovascular: S1 and S2 Present, No Murmur Respiratory: Good respiratory effort, Bilateral Air entry present. No Crackles, No wheezes Abdomen: Bowel Sound present, No tenderness Extremities: No edema Neuro: Alert and oriented x3, no new focal deficit  Data Reviewed: I have Reviewed nursing notes, Vitals, and Lab results. Since last encounter, pertinent lab results CBC and BMP   . I have ordered test including CBC and BMP  .   Disposition: Status is: Inpatient Remains inpatient appropriate  because: Monitoring creatinine overnight and response to Remeron therapy.   Family Communication: No one at bedside Level of care: Med-Surg   Vitals:   01/27/23 2125 01/28/23 0500 01/28/23 0503 01/28/23 1122  BP: 109/66  125/66 121/69  Pulse: 73  63 62  Resp: 15  15 18   Temp: 98.4 F (36.9 C)  98.2 F (36.8 C) 99 F (37.2 C)  TempSrc: Oral  Oral Oral  SpO2: 99%  98% 96%  Weight:  64.7 kg    Height:         Author: Lynden Oxford, MD 01/28/2023 7:03 PM  Please look on [email or web address] to find out who is on call.

## 2023-01-28 NOTE — Evaluation (Signed)
Occupational Therapy Evaluation Patient Details Name: Scott Bass MRN: [id number] DOB: 1950/06/28 Today's Date: 01/28/2023   History of Present Illness Pt is a 72 y/o male presenting after a fall in the bathroom. Imaging negative for acute fx. Found to have AKI, hypokalemia, moderate dehydration and anemia. PMH:  congenital blindness, GERD, HLD, HTN, hypothyroidism, DM2, multiple myeloma on chemo.   Clinical Impression    PTA, pt lives with spouse and reports typically Modified Independent with ADLs, household IADLs and mobility. Pt presents with deficits in standing balance, strength and endurance with a noted fear of falling. Pt with baseline visual impairments. Overall, pt requires Min A for bed mobility, Min-Mod A for transfers with RW. Pt requires Min A for UB ADL and Mod A for LB ADLs. Pt shows insight into deficits and reports feeling he may need to go to rehab before discharging home. Will continue to follow acutely.      If plan is discharge home, recommend the following: A little help with walking and/or transfers;A little help with bathing/dressing/bathroom;Assistance with cooking/housework    Functional Status Assessment  Patient has had a recent decline in their functional status and demonstrates the ability to make significant improvements in function in a reasonable and predictable amount of time.  Equipment Recommendations  BSC/3in1    Recommendations for Other Services       Precautions / Restrictions Precautions Precautions: Fall Precaution Comments: legally blind Restrictions Weight Bearing Restrictions: No      Mobility Bed Mobility Overal bed mobility: Needs Assistance Bed Mobility: Supine to Sit     Supine to sit: Min assist, HOB elevated     General bed mobility comments: handheld assist to lift trunk to EOB    Transfers Overall transfer level: Needs assistance Equipment used: Rolling walker (2 wheels) Transfers: Sit to/from Stand, Bed to  chair/wheelchair/BSC Sit to Stand: Mod assist     Step pivot transfers: Min assist     General transfer comment: Mod A to stand and steady initially, will need educ on hand placement with RW. Min A to step to recliner with quick shuffling steps      Balance Overall balance assessment: Needs assistance Sitting-balance support: No upper extremity supported, Feet supported Sitting balance-Leahy Scale: Fair     Standing balance support: Bilateral upper extremity supported, During functional activity Standing balance-Leahy Scale: Poor                             ADL either performed or assessed with clinical judgement   ADL Overall ADL's : Needs assistance/impaired Eating/Feeding: Set up;Sitting Eating/Feeding Details (indicate cue type and reason): placed salt/pepper on food. opening containers. pt able to hold fork and locate food items accurately Grooming: Minimal assistance;Standing   Upper Body Bathing: Minimal assistance   Lower Body Bathing: Moderate assistance;Sitting/lateral leans;Sit to/from stand   Upper Body Dressing : Minimal assistance;Sitting   Lower Body Dressing: Moderate assistance;Sit to/from stand;Sitting/lateral leans   Toilet Transfer: Moderate assistance;Minimal assistance;Stand-pivot;BSC/3in1   Toileting- Clothing Manipulation and Hygiene: Moderate assistance;Sitting/lateral lean;Sit to/from stand               Vision Baseline Vision/History: 2 Legally blind Ability to See in Adequate Light: 2 Moderately impaired Patient Visual Report: No change from baseline Vision Assessment?: Vision impaired- to be further tested in functional context Additional Comments: baseline visual impairments. reports able to see therapist, recliner in room. able to see out of L eye some  Perception         Praxis         Pertinent Vitals/Pain Pain Assessment Pain Assessment: No/denies pain     Extremity/Trunk Assessment Upper Extremity  Assessment Upper Extremity Assessment: Generalized weakness;Right hand dominant   Lower Extremity Assessment Lower Extremity Assessment: Defer to PT evaluation   Cervical / Trunk Assessment Cervical / Trunk Assessment: Normal   Communication Communication Communication: No apparent difficulties   Cognition Arousal: Alert Behavior During Therapy: Flat affect Overall Cognitive Status: No family/caregiver present to determine baseline cognitive functioning                                 General Comments: A&OX4, follows directions well but fearful of falling. questionable memory deficits as pt perseverating on not having lunch yet despite OT reporting lunch in room and planning to set pt up for it. does show insight into deficits "i probably need to start using the walker" and "probably need some rehab before i go home"     General Comments       Exercises     Shoulder Instructions      Home Living Family/patient expects to be discharged to:: Private residence Living Arrangements: Spouse/significant other Available Help at Discharge: Family;Available 24 hours/day Type of Home: House Home Access: Level entry     Home Layout: One level     Bathroom Shower/Tub: Chief Strategy Officer: Handicapped height Bathroom Accessibility: Yes How Accessible: Accessible via walker Home Equipment: Rolling Walker (2 wheels);Cane - quad          Prior Functioning/Environment Prior Level of Function : Independent/Modified Independent             Mobility Comments: reports not using AD for mobility. from prior admission, pt reports using cane at times ADLs Comments: Indep with ADLs, likes to fry meat and veggies        OT Problem List: Decreased strength;Decreased activity tolerance;Impaired balance (sitting and/or standing);Impaired vision/perception;Decreased knowledge of use of DME or AE;Decreased cognition      OT Treatment/Interventions:  Self-care/ADL training;Therapeutic exercise;DME and/or AE instruction;Energy conservation;Therapeutic activities;Patient/family education;Balance training    OT Goals(Current goals can be found in the care plan section) Acute Rehab OT Goals Patient Stated Goal: avoid any falls OT Goal Formulation: With patient Time For Goal Achievement: 02/11/23 Potential to Achieve Goals: Good  OT Frequency: Min 1X/week    Co-evaluation              AM-PAC OT "6 Clicks" Daily Activity     Outcome Measure Help from another person eating meals?: A Little Help from another person taking care of personal grooming?: A Little Help from another person toileting, which includes using toliet, bedpan, or urinal?: A Little Help from another person bathing (including washing, rinsing, drying)?: A Lot Help from another person to put on and taking off regular upper body clothing?: A Little Help from another person to put on and taking off regular lower body clothing?: A Lot 6 Click Score: 16   End of Session Equipment Utilized During Treatment: Gait belt;Rolling walker (2 wheels) Nurse Communication: Mobility status  Activity Tolerance: Patient tolerated treatment well Patient left: in chair;with call bell/phone within reach;with chair alarm set  OT Visit Diagnosis: Unsteadiness on feet (R26.81);Other abnormalities of gait and mobility (R26.89);Muscle weakness (generalized) (M62.81);Low vision, both eyes (H54.2)  Time: 1210-1225 OT Time Calculation (min): 15 min Charges:  OT General Charges $OT Visit: 1 Visit OT Evaluation $OT Eval Low Complexity: 1 Low  Bradd Canary, OTR/L Acute Rehab Services Office: [phone]   Lorre Munroe 01/28/2023, 12:37 PM

## 2023-01-28 NOTE — Evaluation (Signed)
Physical Therapy Evaluation Patient Details Name: LINDEL VANASTEN MRN: [id number] DOB: Sep 11, 1950 Today's Date: 01/28/2023  History of Present Illness  Pt is a 72 y/o male presenting after a fall in the bathroom. Imaging negative for acute fx. Found to have AKI, hypokalemia, moderate dehydration and anemia. PMH:  congenital blindness, GERD, HLD, HTN, hypothyroidism, DM2, multiple myeloma on chemo.  Clinical Impression  Pt admitted with above diagnosis.  Pt agreeable to sit EOB only at time of PT eval, declines further mobility stating he is too tired. Spoke briefly with pt regarding d/c plan, wife is also legally blind and unsure if they would be able to manage at home at pt current status. Pt states to PT "What is the point, I am dying". He does not recall talking with Palliative Care Team. Pt tells PT he is very "so depressed", will make RN aware At this time  -->"Patient will benefit from continued follow up therapy, <3 hours/day" Will follow in acute setting and update accordingly, pt and wife may need to have further discussions regarding goals of care. Provided with warm blankets end of session,pt reported incr comfort.  Pt currently with functional limitations due to the deficits listed below (see PT Problem List). Pt will benefit from acute skilled PT to increase their independence and safety with mobility to allow discharge.          If plan is discharge home, recommend the following: A little help with walking and/or transfers;A little help with bathing/dressing/bathroom;Help with stairs or ramp for entrance;Assistance with cooking/housework;Assist for transportation   Can travel by private vehicle        Equipment Recommendations None recommended by PT  Recommendations for Other Services       Functional Status Assessment Patient has had a recent decline in their functional status and demonstrates the ability to make significant improvements in function in a reasonable and  predictable amount of time.     Precautions / Restrictions Precautions Precautions: Fall Precaution Comments: legally blind Restrictions Weight Bearing Restrictions: No      Mobility  Bed Mobility Overal bed mobility: Needs Assistance Bed Mobility: Supine to Sit, Sit to Supine     Supine to sit: Min assist, HOB elevated (20*) Sit to supine: Min assist   General bed mobility comments: assist to lift trunk to come to sit, able to scoot to EOB with cues, assist to lift LEs to return to supine    Transfers                   General transfer comment: pt was mod assist to stand with OT, declined any attempt to stand at time of PT eval    Ambulation/Gait               General Gait Details: pt declined  Stairs            Wheelchair Mobility     Tilt Bed    Modified Rankin (Stroke Patients Only)       Balance   Sitting-balance support: No upper extremity supported, Feet supported Sitting balance-Leahy Scale: Fair                                       Pertinent Vitals/Pain Pain Assessment Pain Assessment: No/denies pain    Home Living Family/patient expects to be discharged to:: Private residence Living Arrangements: Spouse/significant other Available Help at Discharge:  Family;Available 24 hours/day Type of Home: House Home Access: Level entry       Home Layout: One level Home Equipment: Agricultural consultant (2 wheels);Cane - quad Additional Comments: pt reports wife is also legally blind    Prior Function Prior Level of Function : Independent/Modified Independent             Mobility Comments: reports not using AD for mobility. from prior admission, pt reports using cane at times; ADLs Comments: Indep with ADLs, likes to fry meat and veggies     Extremity/Trunk Assessment   Upper Extremity Assessment Upper Extremity Assessment: Generalized weakness;Defer to OT evaluation    Lower Extremity Assessment Lower  Extremity Assessment: Generalized weakness    Cervical / Trunk Assessment Cervical / Trunk Assessment: Normal  Communication   Communication Communication: No apparent difficulties  Cognition Arousal: Alert Behavior During Therapy: Flat affect Overall Cognitive Status: No family/caregiver present to determine baseline cognitive functioning Area of Impairment: Memory, Safety/judgement, Problem solving                     Memory: Decreased short-term memory   Safety/Judgement: Decreased awareness of deficits     General Comments: A&O x3 at time of PT eval, follows one step commands consisitently--questionable STM deficits - pt does not recall speaking with Palliative Care.        General Comments      Exercises     Assessment/Plan    PT Assessment Patient needs continued PT services  PT Problem List Decreased strength;Decreased activity tolerance;Decreased balance;Decreased mobility;Decreased cognition       PT Treatment Interventions DME instruction;Balance training;Gait training;Functional mobility training;Therapeutic activities;Therapeutic exercise;Patient/family education    PT Goals (Current goals can be found in the Care Plan section)  Acute Rehab PT Goals PT Goal Formulation: With patient Time For Goal Achievement: 02/11/23 Potential to Achieve Goals: Good    Frequency Min 1X/week     Co-evaluation               AM-PAC PT "6 Clicks" Mobility  Outcome Measure Help needed turning from your back to your side while in a flat bed without using bedrails?: A Little Help needed moving from lying on your back to sitting on the side of a flat bed without using bedrails?: A Little Help needed moving to and from a bed to a chair (including a wheelchair)?: A Lot Help needed standing up from a chair using your arms (e.g., wheelchair or bedside chair)?: A Lot Help needed to walk in hospital room?: A Lot Help needed climbing 3-5 steps with a railing? :  Total 6 Click Score: 13    End of Session   Activity Tolerance: Patient limited by fatigue Patient left: in bed;with call bell/phone within reach;with bed alarm set   PT Visit Diagnosis: Other abnormalities of gait and mobility (R26.89);Muscle weakness (generalized) (M62.81);Difficulty in walking, not elsewhere classified (R26.2)    Time: 5409-8119 PT Time Calculation (min) (ACUTE ONLY): 14 min   Charges:   PT Evaluation $PT Eval Low Complexity: 1 Low   PT General Charges $$ ACUTE PT VISIT: 1 Visit         Steve Youngberg, PT  Acute Rehab Dept [facility]) [phone]  01/28/2023   [facility] 01/28/2023, 2:51 PM

## 2023-01-28 NOTE — Progress Notes (Signed)
Palliative Medicine Progress Note   Patient Name: Scott Bass       Date: 01/28/2023 DOB: 1950-12-26  Age: 72 y.o. MRN#: [id number] Attending Physician: Rolly Salter, MD Primary Care Physician: Tracey Harries, MD Admit Date: 01/26/2023  Reason for Consultation/Follow-up: {Reason for Consult:23484}  HPI/Patient Profile: 72 y.o. male  with past medical history of legally blind, CKD, GERD, OSA, hypertension, type 2 diabetes, and recent diagnosis of multiple myeloma on chemotherapy (last treatment on 9/17).  He presented to the ED on 01/26/2023 after a fall at home.  He was admitted with AKI, dehydration, failure to thrive, and hypokalemia.   Palliative Medicine has been consulted for goals of care    Subjective: Chart reviewed. Hemoglobin 7.9 this morning.   Patient is resting quietly in bed. He tells me he feels "down" because he "knows his time is near". He declines to discuss this further, stating he "just wants to rest". I offered spiritual care support, but he declines this as well.   I later spoke with his wife by phone.   Objective:  Physical Exam          Vital Signs: BP 125/66 (BP Location: Left Arm)   Pulse 63   Temp 98.2 F (36.8 C) (Oral)   Resp 15   Ht 5\' 6"  (1.676 m)   Wt 64.7 kg   SpO2 98%   BMI 23.02 kg/m  SpO2: SpO2: 98 % O2 Device: O2 Device: Room Air O2 Flow Rate:    Intake/output summary:  Intake/Output Summary (Last 24 hours) at 01/28/2023 1122 Last data filed at 01/28/2023 0700 Gross per 24 hour  Intake 715.92 ml  Output 400 ml  Net 315.92 ml    LBM: Last BM Date : 01/26/23     Palliative Assessment/Data: ***     Palliative Medicine Assessment & Plan   Assessment: Principal Problem:   Acute kidney injury (AKI) with acute tubular necrosis  (ATN) (HCC) Active Problems:   Hypothyroidism   Diabetes mellitus type 2, controlled (HCC)   Essential hypertension   Multiple myeloma without remission (HCC)   Hypokalemia    Recommendations/Plan: Continue current interventions - treat the treatable  Goals of Care and Additional Recommendations: Limitations on Scope of Treatment: {Recommended Scope and Preferences:21019}  Code Status:   Prognosis:  {Palliative Care Prognosis:23504}  Discharge Planning: {Palliative dispostion:23505}  Care plan was discussed with ***  Thank you for allowing the Palliative Medicine Team to assist in the care of this patient.   ***   Merry Proud, NP   Please contact Palliative Medicine Team phone at [phone] for questions and concerns.  For individual providers, please see [facility].

## 2023-01-28 NOTE — Plan of Care (Signed)
  Problem: Activity: Goal: Risk for activity intolerance will decrease Outcome: Progressing   Problem: Nutrition: Goal: Adequate nutrition will be maintained Outcome: Progressing   Problem: Coping: Goal: Level of anxiety will decrease Outcome: Progressing   Problem: Elimination: Goal: Will not experience complications related to bowel motility Outcome: Progressing Goal: Will not experience complications related to urinary retention Outcome: Adequate for Discharge   Problem: Pain Managment: Goal: General experience of comfort will improve Outcome: Progressing   Problem: Safety: Goal: Ability to remain free from injury will improve Outcome: Progressing   Problem: Skin Integrity: Goal: Risk for impaired skin integrity will decrease Outcome: Progressing

## 2023-01-29 ENCOUNTER — Encounter: Payer: Self-pay | Admitting: Hematology

## 2023-01-29 DIAGNOSIS — D6481 Anemia due to antineoplastic chemotherapy: Secondary | ICD-10-CM | POA: Diagnosis not present

## 2023-01-29 DIAGNOSIS — N170 Acute kidney failure with tubular necrosis: Secondary | ICD-10-CM | POA: Diagnosis not present

## 2023-01-29 DIAGNOSIS — T451X5A Adverse effect of antineoplastic and immunosuppressive drugs, initial encounter: Secondary | ICD-10-CM | POA: Diagnosis not present

## 2023-01-29 DIAGNOSIS — C9000 Multiple myeloma not having achieved remission: Secondary | ICD-10-CM | POA: Diagnosis not present

## 2023-01-29 LAB — TYPE AND SCREEN
ABO/RH(D): B POS
Antibody Screen: POSITIVE
Unit division: 0
Unit division: 0

## 2023-01-29 LAB — BPAM RBC
Blood Product Expiration Date: 202410062359
Blood Product Expiration Date: 202410262359
ISSUE DATE / TIME: 202409211115
Unit Type and Rh: 7300
Unit Type and Rh: 7300

## 2023-01-29 LAB — COMPREHENSIVE METABOLIC PANEL
ALT: 14 U/L (ref 0–44)
AST: 13 U/L — ABNORMAL LOW (ref 15–41)
Albumin: 2.1 g/dL — ABNORMAL LOW (ref 3.5–5.0)
Alkaline Phosphatase: 106 U/L (ref 38–126)
Anion gap: 11 (ref 5–15)
BUN: 18 mg/dL (ref 8–23)
CO2: 26 mmol/L (ref 22–32)
Calcium: 8 mg/dL — ABNORMAL LOW (ref 8.9–10.3)
Chloride: 103 mmol/L (ref 98–111)
Creatinine, Ser: 1.04 mg/dL (ref 0.61–1.24)
GFR, Estimated: >60 mL/min (ref >60)
Glucose, Bld: 100 mg/dL — ABNORMAL HIGH (ref 70–99)
Potassium: 3.8 mmol/L (ref 3.5–5.1)
Sodium: 140 mmol/L (ref 135–145)
Total Bilirubin: 1.1 mg/dL (ref 0.3–1.2)
Total Protein: 4.4 g/dL — ABNORMAL LOW (ref 6.5–8.1)

## 2023-01-29 LAB — CBC WITH DIFFERENTIAL/PLATELET
Abs Immature Granulocytes: 0.03 10*3/uL (ref 0.00–0.07)
Basophils Absolute: 0 10*3/uL (ref 0.0–0.1)
Basophils Relative: 0 %
Eosinophils Absolute: 0 10*3/uL (ref 0.0–0.5)
Eosinophils Relative: 0 %
HCT: 24.3 % — ABNORMAL LOW (ref 39.0–52.0)
Hemoglobin: 7.7 g/dL — ABNORMAL LOW (ref 13.0–17.0)
Immature Granulocytes: 1 %
Lymphocytes Relative: 24 %
Lymphs Abs: 1.3 10*3/uL (ref 0.7–4.0)
MCH: 30.7 pg (ref 26.0–34.0)
MCHC: 31.7 g/dL (ref 30.0–36.0)
MCV: 96.8 fL (ref 80.0–100.0)
Monocytes Absolute: 0.5 10*3/uL (ref 0.1–1.0)
Monocytes Relative: 8 %
Neutro Abs: 3.7 10*3/uL (ref 1.7–7.7)
Neutrophils Relative %: 67 %
Platelets: 107 10*3/uL — ABNORMAL LOW (ref 150–400)
RBC: 2.51 MIL/uL — ABNORMAL LOW (ref 4.22–5.81)
RDW: 17 % — ABNORMAL HIGH (ref 11.5–15.5)
WBC: 5.5 10*3/uL (ref 4.0–10.5)
nRBC: 0 % (ref 0.0–0.2)

## 2023-01-29 LAB — GLUCOSE, CAPILLARY
Glucose-Capillary: 95 mg/dL (ref 70–99)
Glucose-Capillary: 373 mg/dL — ABNORMAL HIGH (ref 70–99)
Glucose-Capillary: 181 mg/dL — ABNORMAL HIGH (ref 70–99)
Glucose-Capillary: 196 mg/dL — ABNORMAL HIGH (ref 70–99)
Glucose-Capillary: 110 mg/dL — ABNORMAL HIGH (ref 70–99)

## 2023-01-29 LAB — MAGNESIUM: Magnesium: 1.8 mg/dL (ref 1.7–2.4)

## 2023-01-29 LAB — PREPARE RBC (CROSSMATCH)

## 2023-01-29 MED ORDER — SODIUM CHLORIDE 0.9 % IV BOLUS
1000.0000 mL | Freq: Once | INTRAVENOUS | Status: AC
  Administered 2023-01-29: 1000 mL via INTRAVENOUS

## 2023-01-29 MED ORDER — GLUCERNA SHAKE PO LIQD
237.0000 mL | Freq: Three times a day (TID) | ORAL | Status: DC
  Administered 2023-01-29 – 2023-01-31 (×3): 237 mL via ORAL
  Filled 2023-01-29 (×11): qty 237

## 2023-01-29 MED ORDER — SODIUM CHLORIDE 0.9% IV SOLUTION: Freq: Once | INTRAVENOUS | Status: AC

## 2023-01-29 MED ORDER — SODIUM CHLORIDE 0.9 % IV SOLN: INTRAVENOUS | Status: DC

## 2023-01-29 NOTE — Progress Notes (Signed)
HEMATOLOGY/ONCOLOGY INPATIENT PROGRESS NOTE  Date of Service: 01/29/2023  Inpatient Attending: .Rolly Salter, MD   SUBJECTIVE  Patient is well-known at the [facility]. He was recently discharged from a rehab facilitation last week. Patient notes he had a fall, but he is unsure of the reason. Pt notes he did not obtain any injuries.  During this visit, he denies fever, chills, night sweats, unexpected weight loss, abdominal pain, chest pain, or leg swelling.   Patient notes he has not been eating as much as he is used to, but he has been staying hydrated.   Pt notes he had physical therapy session today and was able to walk.   PHYSICAL EXAMINATION: . Vitals:   01/29/23 0621 01/29/23 1012 01/29/23 1359 01/29/23 1510  BP:  107/61 (!) 82/40 112/60  Pulse:  80 76 66  Resp:  17 20 13   Temp:  98.7 F (37.1 C) 99.4 F (37.4 C) 98 F (36.7 C)  TempSrc:  Oral Rectal Oral  SpO2:  96% 99% 98%  Weight: 65.6 kg     Height:       Filed Weights   01/26/23 2312 01/28/23 0500 01/29/23 0621  Weight: 64.4 kg 64.7 kg 65.6 kg   .Body mass index is 23.34 kg/m.  GENERAL:alert, in no acute distress and comfortable SKIN: skin color, texture, turgor are normal, no rashes or significant lesions EYES: normal, conjunctiva are pink and non-injected, sclera clear OROPHARYNX:no exudate, no erythema and lips, buccal mucosa, and tongue normal  NECK: supple, no JVD, thyroid normal size, non-tender, without nodularity LYMPH:  no palpable lymphadenopathy in the cervical, axillary or inguinal LUNGS: clear to auscultation with normal respiratory effort HEART: regular rate & rhythm,  no murmurs and no lower extremity edema ABDOMEN: abdomen soft, non-tender, normoactive bowel sounds  Musculoskeletal: no cyanosis of digits and no clubbing  PSYCH: alert & oriented x 3 with fluent speech NEURO: no focal motor/sensory deficits  MEDICAL HISTORY:  Past Medical History:  Diagnosis Date   Arthritis     "lower back" (12/18/2017)   Better eye: moderate vision impairment; lesser eye: blind    GERD (gastroesophageal reflux disease)    Glaucoma, both eyes    High cholesterol    Hypertension    Hypothyroidism    Irregular heartbeat    Legally blind    "both eyes; can see some out of left eye"   OSA (obstructive sleep apnea)    Thyroid disease    Type II diabetes mellitus (HCC)    Vitamin B12 deficiency     SURGICAL HISTORY: Past Surgical History:  Procedure Laterality Date   LEFT HEART CATH AND CORONARY ANGIOGRAPHY N/A 12/18/2017   Procedure: LEFT HEART CATH AND CORONARY ANGIOGRAPHY;  Surgeon: Elder Negus, MD;  Location: [facility];  Service: Cardiovascular;  Laterality: N/A;   NOSE SURGERY     "was crooked; they straightened it out" (12/18/2017)    SOCIAL HISTORY: Social History   Socioeconomic History   Marital status: Married    Spouse name: Not on file   Number of children: 0   Years of education: Not on file   Highest education level: Not on file  Occupational History   Occupation: retired  Tobacco Use   Smoking status: Never   Smokeless tobacco: Never  Vaping Use   Vaping status: Never Used  Substance and Sexual Activity   Alcohol use: Not Currently    Comment: occ   Drug use: Never   Sexual  activity: Not Currently  Other Topics Concern   Not on file  Social History Narrative   Not on file   Social Determinants of Health   Financial Resource Strain: Low Risk  (10/10/2022)   Received from [facility], [facility]   Overall Financial Resource Strain (CARDIA)    Difficulty of Paying Living Expenses: Not very hard  Food Insecurity: No Food Insecurity (01/28/2023)   Hunger Vital Sign    Worried About Running Out of Food in the Last Year: Never true    Ran Out of Food in the Last Year: Never true  Recent Concern: Food Insecurity - Food Insecurity Present (12/06/2022)   Hunger Vital Sign    Worried About Running Out of Food in the Last Year:  Often true    Ran Out of Food in the Last Year: Often true  Transportation Needs: No Transportation Needs (01/28/2023)   PRAPARE - Administrator, Civil Service (Medical): No    Lack of Transportation (Non-Medical): No  Physical Activity: Insufficiently Active (12/23/2020)   [facility], [facility]   Exercise Vital Sign    Days of Exercise per Week: 2 days    Minutes of Exercise per Session: 10 min  Stress: No Stress Concern Present (12/23/2020)   Received from [facility], [facility] - Occupational Stress Questionnaire    Feeling of Stress : Not at all  Social Connections: Unknown (09/16/2021)   Received from [facility], [facility]   Social Network    Social Network: Not on file  Intimate Partner Violence: Not At Risk (01/28/2023)   Humiliation, Afraid, Rape, and Kick questionnaire    Fear of Current or Ex-Partner: No    Emotionally Abused: No    Physically Abused: No    Sexually Abused: No    FAMILY HISTORY: Family History  Problem Relation Age of Onset   Diabetes Mellitus II Mother 30   Diabetes Mellitus II Sister    Breast cancer Sister    Heart failure Sister 85    ALLERGIES:  is allergic to hctz [hydrochlorothiazide].  MEDICATIONS:  Scheduled Meds:  brimonidine  1 drop Both Eyes TID   feeding supplement (GLUCERNA SHAKE)  237 mL Oral TID BM   ferrous sulfate  325 mg Oral QODAY   insulin aspart  0-5 Units Subcutaneous QHS   insulin aspart  0-9 Units Subcutaneous TID WC   latanoprost  1 drop Both Eyes QPM   levothyroxine  150 mcg Oral Daily   magnesium oxide  400 mg Oral Daily   mirtazapine  7.5 mg Oral QHS   pantoprazole  40 mg Oral Daily   polyethylene glycol  17 g Oral Daily   sodium bicarbonate  1,300 mg Oral TID   Continuous Infusions:  sodium chloride     PRN Meds:.acetaminophen, mouth rinse  REVIEW OF SYSTEMS:    10 Point review of Systems was done is negative except as  noted above.   LABORATORY DATA:  I have reviewed the data as listed  .    Latest Ref Rng & Units 01/29/2023    4:59 AM 01/28/2023    8:34 AM 01/27/2023    3:42 PM  CBC  WBC 4.0 - 10.5 K/uL 5.5  5.8  4.8   Hemoglobin 13.0 - 17.0 g/dL 7.7  7.9  7.6   Hematocrit 39.0 - 52.0 % 24.3  25.0  23.5   Platelets 150 - 400 K/uL 107  100  95     .    Latest Ref Rng & Units 01/29/2023    4:59 AM 01/28/2023    8:34 AM 01/27/2023    3:42 PM  CMP  Glucose 70 - 99 mg/dL 962  952  841   BUN 8 - 23 mg/dL 18  22  25    Creatinine 0.61 - 1.24 mg/dL 3.24  4.01  0.27   Sodium 135 - 145 mmol/L 140  141  138   Potassium 3.5 - 5.1 mmol/L 3.8  4.1  2.9   Chloride 98 - 111 mmol/L 103  107  104   CO2 22 - 32 mmol/L 26  26  23    Calcium 8.9 - 10.3 mg/dL 8.0  7.9  7.5   Total Protein 6.5 - 8.1 g/dL 4.4  4.7    Total Bilirubin 0.3 - 1.2 mg/dL 1.1  1.0    Alkaline Phos 38 - 126 U/L 106  109    AST 15 - 41 U/L 13  10    ALT 0 - 44 U/L 14  12       RADIOGRAPHIC STUDIES: I have personally reviewed the radiological images as listed and agreed with the findings in the report. DG Abd Portable 1V  Result Date: 01/28/2023 CLINICAL DATA:  No bowel movement for 3 days EXAM: PORTABLE ABDOMEN - 1 VIEW COMPARISON:  12/19/2022 FINDINGS: Mild air distension of transverse colon and few air-filled loops small bowel but no convincing obstructive pattern. No radiopaque calculi. No significant stool burden. Lytic lesions within the pelvis corresponding to history of myeloma. IMPRESSION: Nonspecific nonobstructive bowel gas pattern without significant stool burden. Lytic lesions corresponding to history of multiple myeloma Electronically Signed   By: Jasmine Pang M.D.   On: 01/28/2023 19:01   DG Pelvis 1-2 Views  Result Date: 01/26/2023 CLINICAL DATA:  Status post recent fall. EXAM: PELVIS - 1-2 VIEW COMPARISON:  None Available. FINDINGS: There is no evidence of pelvic fracture or diastasis. Moderate severity degenerative  changes are seen involving both hips, in the form of joint space narrowing, acetabular sclerosis and lateral acetabular bony spurring. No pelvic bone lesions are seen. IMPRESSION: Moderate severity degenerative changes without an acute osseous abnormality. Electronically Signed   By: Aram Candela M.D.   On: 01/26/2023 19:12   DG Chest 2 View  Result Date: 01/26/2023 CLINICAL DATA:  Weakness, hypotension and recent falls. EXAM: CHEST - 2 VIEW COMPARISON:  December 27, 2022 FINDINGS: The heart size and mediastinal contours are within normal limits. There is marked severity calcification of the aortic arch. Both lungs are clear. Multilevel degenerative changes seen throughout the thoracic spine. IMPRESSION: No active cardiopulmonary disease. Electronically Signed   By: Aram Candela M.D.   On: 01/26/2023 19:09   CT Head Wo Contrast  Result Date: 01/26/2023 CLINICAL DATA:  Polytrauma, blunt.  History of multiple myeloma EXAM: CT HEAD WITHOUT CONTRAST CT CERVICAL SPINE WITHOUT CONTRAST TECHNIQUE: Multidetector CT imaging of the head and cervical spine was performed following the standard protocol without intravenous contrast. Multiplanar CT image reconstructions of the cervical spine were also generated. RADIATION DOSE REDUCTION: This exam was performed according to the departmental dose-optimization program which includes automated exposure control, adjustment of the mA and/or kV according to patient size and/or use of iterative reconstruction technique. COMPARISON:  12/04/2022 FINDINGS: CT HEAD FINDINGS Brain: No evidence of acute infarction, hemorrhage, hydrocephalus, extra-axial collection or mass lesion/mass effect. Vascular: Atherosclerotic calcifications involving the large vessels of the skull  base. No unexpected hyperdense vessel. Skull: Redemonstration of scattered lucent lesions throughout the bony calvarium. No acute calvarial fracture. Sinuses/Orbits: No acute finding. Other: Negative for  scalp hematoma. CT CERVICAL SPINE FINDINGS Alignment: Facet joints are aligned without dislocation or traumatic listhesis. Dens and lateral masses are aligned. Skull base and vertebrae: No acute fracture is identified. Widespread lytic lesions throughout the visualized osseous structures compatible with known multiple myeloma. Soft tissues and spinal canal: No prevertebral fluid or swelling. No visible canal hematoma. Disc levels: Bulky bridging anterior endplate osteophytes spanning from C2 to C6. Relative preservation of the disc heights. No advanced facet arthropathy. Upper chest: Included lung apices are clear. Other: Bilateral carotid atherosclerosis. IMPRESSION: 1. No acute intracranial abnormality. 2. No acute fracture or subluxation of the cervical spine. 3. Widespread lytic lesions throughout the visualized osseous structures compatible with known multiple myeloma. Electronically Signed   By: Duanne Guess D.O.   On: 01/26/2023 18:53   CT Cervical Spine Wo Contrast  Result Date: 01/26/2023 CLINICAL DATA:  Polytrauma, blunt.  History of multiple myeloma EXAM: CT HEAD WITHOUT CONTRAST CT CERVICAL SPINE WITHOUT CONTRAST TECHNIQUE: Multidetector CT imaging of the head and cervical spine was performed following the standard protocol without intravenous contrast. Multiplanar CT image reconstructions of the cervical spine were also generated. RADIATION DOSE REDUCTION: This exam was performed according to the departmental dose-optimization program which includes automated exposure control, adjustment of the mA and/or kV according to patient size and/or use of iterative reconstruction technique. COMPARISON:  12/04/2022 FINDINGS: CT HEAD FINDINGS Brain: No evidence of acute infarction, hemorrhage, hydrocephalus, extra-axial collection or mass lesion/mass effect. Vascular: Atherosclerotic calcifications involving the large vessels of the skull base. No unexpected hyperdense vessel. Skull: Redemonstration of  scattered lucent lesions throughout the bony calvarium. No acute calvarial fracture. Sinuses/Orbits: No acute finding. Other: Negative for scalp hematoma. CT CERVICAL SPINE FINDINGS Alignment: Facet joints are aligned without dislocation or traumatic listhesis. Dens and lateral masses are aligned. Skull base and vertebrae: No acute fracture is identified. Widespread lytic lesions throughout the visualized osseous structures compatible with known multiple myeloma. Soft tissues and spinal canal: No prevertebral fluid or swelling. No visible canal hematoma. Disc levels: Bulky bridging anterior endplate osteophytes spanning from C2 to C6. Relative preservation of the disc heights. No advanced facet arthropathy. Upper chest: Included lung apices are clear. Other: Bilateral carotid atherosclerosis. IMPRESSION: 1. No acute intracranial abnormality. 2. No acute fracture or subluxation of the cervical spine. 3. Widespread lytic lesions throughout the visualized osseous structures compatible with known multiple myeloma. Electronically Signed   By: Duanne Guess D.O.   On: 01/26/2023 18:53    ASSESSMENT & PLAN:  72 year old legally blind gentleman with significant functional limitations and limited social support presented with who was newly diagnosed with multiple myeloma and of May 2024 and was set up for chemo counseling to start daratumumab dexamethasone Velcade and Xgeva and orders were placed and the patient was to get a PET scan but was lost to follow-up.  He had also been referred to social work at that time to help address his barriers of care. He is now admitted with progressive multiple myeloma.   1.Multiple myeloma, IgG kappa, diagnosed May 2024-untreated since patient did not follow-up now admitted with progressive myeloma with severe anemia and progressive renal failure. CTs 12/04/2022-innumerable lytic lesions, large expansile lesion in the anterior right second rib, multiple pathologic rib fractures,  expansile T4 (T3 on CT cervical spine) mass with soft tissue component extending into the  central canal and left neural foramen, C7 transverse process fracture Decadron daily x 4 starting 12/05/2022 Cycle 1 day 1 Velcade 12/07/2022 Decadron daily x 4 starting 12/12/2022 Cycle 1 day 8 Velcade 12/14/2022 2.  Acute/chronic renal failure 3.  Severe anemia 4.  Mild thrombocytopenia 5.  Diabetes 6.  Hypertension 7.  Glaucoma 8.  OSA  PLAN: -Discussed lab results from today, 01/29/2023, in detail. CBC shows decreased hemoglobin at 7.7 g/dL, decreased hematocrit at 24.3%, and decreased platelets at 107 K. CMP shows improved creatinine at 1.04. -Discussed with the patient that he will be able to receive part of his treatment as in-patient and part of his treatment will be after discharge.  I spent {CHL ONC TIME VISIT - ONGEX:[id number]} counseling the patient face to face. The total time spent in the appointment was {CHL ONC TIME VISIT - NUUVO:[id number]} and more than 50% was on counseling and direct patient cares.    Wyvonnia Lora MD MS AAHIVMS [facility] Hematology/Oncology Physician [facility]  (Office):       [phone] (Work cell):  [phone] (Fax):           [phone]  01/29/2023 3:57 PM    I,Param Shah,acting as a scribe for Wyvonnia Lora, MD.

## 2023-01-29 NOTE — Progress Notes (Signed)
[facility] Progress Note Patient: Scott Bass:[id number] DOB: 01/04/1951 DOA: 01/26/2023  DOS: the patient was seen and examined on 01/29/2023  Brief hospital course: PMH of blindness, GERD, HLD, HTN, hypothyroidism podiatry, multiple myeloma on chemotherapy last session 9/17 presented to hospital with a fall.  Found to have anemia, hypokalemia, thrombocytopenia as well as AKI.  Assessment and Plan: AKI Baseline creatinine around 1. On presentation serum creatinine 2.05 with elevated BUN. Treated with IV hydration with improvement.  Will stop fluid and monitor. Monitor  Hypokalemia  Currently treated. Monitor  Essential hypertension. Hypotension. Blood pressure dropped significantly. Patient was on metoprolol which is stopped. Monitor on telemetry.  Treated with IV fluid.  Multiple myeloma on chemotherapy. Chemotherapy and multiple myeloma induced anemia. SP blood transfusion.  Monitor H&H.  Transfuse 7 or hemodynamic instability. Prognosis poor. Monitor.  Chronic blindness Noted.  Unwitnessed fall  No evidence of injury  CT of head and neck unremarkable  PT OT consulted.  Goals of care  currently DNR/DNI  Palliative care following.  Appreciate assistance.  Mood disorder. Patient has reported to RN as well as PT staff that he is feeling depressed. Palliative care is actually following the patient.  In discussing with the wife. Currently outpatient.  Referral has been placed. Will discussed with regards to initiating therapy with Remeron   Subjective: No nausea no vomiting no fever no chills.  Slept okay last night.  Oral intake minimal.  Physical Exam: General: in Mild distress, No Rash Cardiovascular: S1 and S2 Present, No Murmur Respiratory: Good respiratory effort, Bilateral Air entry present. No Crackles, No wheezes Abdomen: Bowel Sound present, No tenderness Extremities: No edema Neuro: Alert and oriented x3, no new focal deficit  Data  Reviewed: I have Reviewed nursing notes, Vitals, and Lab results. Since last encounter, pertinent lab results CBC and BMP   . I have ordered test including CBC and BMP  .  Discussed with oncology  Disposition: Status is: Inpatient Remains inpatient appropriate because: Need for further workup   Family Communication: No one at bedside Level of care: Progressive   Vitals:   01/29/23 1012 01/29/23 1359 01/29/23 1510 01/29/23 1732  BP: 107/61 (!) 82/40 112/60 111/60  Pulse: 80 76 66 80  Resp: 17 20 13 18   Temp: 98.7 F (37.1 C) 99.4 F (37.4 C) 98 F (36.7 C) 98.4 F (36.9 C)  TempSrc: Oral Rectal Oral Oral  SpO2: 96% 99% 98% 100%  Weight:      Height:         Author: Lynden Oxford, MD 01/29/2023 8:05 PM  Please look on [email or web address] to find out who is on call.

## 2023-01-29 NOTE — Plan of Care (Signed)
  Problem: Education: Goal: Ability to describe self-care measures that may prevent or decrease complications (Diabetes Survival Skills Education) will improve Outcome: Progressing   Problem: Coping: Goal: Ability to adjust to condition or change in health will improve Outcome: Progressing   Problem: Fluid Volume: Goal: Ability to maintain a balanced intake and output will improve Outcome: Progressing   Problem: Health Behavior/Discharge Planning: Goal: Ability to identify and utilize available resources and services will improve Outcome: Progressing Goal: Ability to manage health-related needs will improve Outcome: Progressing   Problem: Metabolic: Goal: Ability to maintain appropriate glucose levels will improve Outcome: Progressing   Problem: Nutritional: Goal: Maintenance of adequate nutrition will improve Outcome: Progressing Goal: Progress toward achieving an optimal weight will improve Outcome: Progressing   Problem: Skin Integrity: Goal: Risk for impaired skin integrity will decrease Outcome: Progressing   Problem: Tissue Perfusion: Goal: Adequacy of tissue perfusion will improve Outcome: Progressing   Problem: Health Behavior/Discharge Planning: Goal: Ability to manage health-related needs will improve Outcome: Progressing   Problem: Clinical Measurements: Goal: Cardiovascular complication will be avoided Outcome: Progressing   Problem: Activity: Goal: Risk for activity intolerance will decrease Outcome: Progressing   Problem: Nutrition: Goal: Adequate nutrition will be maintained Outcome: Progressing   Problem: Coping: Goal: Level of anxiety will decrease Outcome: Progressing   Problem: Safety: Goal: Ability to remain free from injury will improve Outcome: Progressing   Problem: Skin Integrity: Goal: Risk for impaired skin integrity will decrease Outcome: Progressing   Problem: Clinical Measurements: Goal: Will remain free from  infection Outcome: Adequate for Discharge Goal: Respiratory complications will improve Outcome: Adequate for Discharge

## 2023-01-29 NOTE — Progress Notes (Signed)
Mobility Specialist - Progress Note   01/29/23 1217  Mobility  Activity Ambulated with assistance in hallway  Level of Assistance Moderate assist, patient does 50-74%  Assistive Device Front wheel walker  Distance Ambulated (ft) 41 ft  Range of Motion/Exercises Active Assistive  Activity Response Tolerated well  Mobility Referral Yes  $Mobility charge 1 Mobility  Mobility Specialist Start Time (ACUTE ONLY) 1200  Mobility Specialist Stop Time (ACUTE ONLY) 1217  Mobility Specialist Time Calculation (min) (ACUTE ONLY) 17 min   Pt was found in bed and agreeable to try ambulation and sitting on recliner chair. Pt was mod-A for STS and CG for ambulation. Requires directional cues for ambulation and gets fatigued quickly. NT followed with recliner chair and was rolled back to room. Pt was left on recliner chair with all needs met. Call bell in reach and chair alarm on.  Billey Chang Mobility Specialist

## 2023-01-29 NOTE — Consult Note (Signed)
WOC Nurse Consult Note: Reason for Consult: coccyx wound  Wound type: Stage 2 Pressure Injury  Pressure Injury POA: Yes Measurement: 2 cm x 0.5 cm x 0.1 cm  Wound bed: 100% pink moist  Drainage (amount, consistency, odor) minimal serosanguinous  Periwound: intact  Dressing procedure/placement/frequency: Clean coccyx PI with NS, apply silicone foam.  Lift foam daily to assess wound.  Change foam dressing q3 days and prn soiling.   POC discussed with bedside nurse. WOC team will not follow at this time. Re-consult if further needs arise.   Thank you,    Priscella Mann MSN, RN-BC, [employer] [phone]

## 2023-01-29 NOTE — Progress Notes (Signed)
[name] [facility] Liaison note:  Notified by [facility] manager of patient/family request for [facility] Palliative services at home after discharge.   Please call with any hospice or outpatient palliative care related questions.  Thank you for the opportunity to participate in this patient's care.  Glenna Fellows, BSN, RN, OCN [employer] [phone]

## 2023-01-29 NOTE — TOC Progression Note (Addendum)
Transition of Care [facility]) - Progression Note    Patient Details  Name: Scott Bass MRN: [id number] Date of Birth: 1950/07/07  Transition of Care [facility]) CM/SW Contact  Howell Rucks, RN Phone Number: 01/29/2023, 11:33 AM  Clinical Narrative:  PT eval completed, recommendation for short term rehab/SNF. NCM met with pt at bedside, pt declined SNF, reports he is going home. NCM called to pt's spouse Bonita Quin), discussed PT recommendation for short term rehab at SNF, informed pt declined, spouse reports they are at co-pay SNF days, declined SNF placement, agreeable to [facility] PT/OT. Team notified, requested for for [facility] PT/OT. TOC will continue to follow.   -2:37pm TOC consulted for Outpatient Palliative Services. Referral sent to [facility], rep-Shawn.     Expected Discharge Plan: Home/Self Care Barriers to Discharge: Continued Medical Work up  Expected Discharge Plan and Services   Discharge Planning Services: CM Consult   Living arrangements for the past 2 months: Single Family Home                                       Social Determinants of Health (SDOH) Interventions SDOH Screenings   Food Insecurity: No Food Insecurity (01/28/2023)  Recent Concern: Food Insecurity - Food Insecurity Present (12/06/2022)  Housing: Low Risk  (01/28/2023)  Transportation Needs: No Transportation Needs (01/28/2023)  Utilities: Not At Risk (01/28/2023)  Financial Resource Strain: Low Risk  (10/10/2022)   Received from [facility], [facility]  Physical Activity: Insufficiently Active (12/23/2020)   Received from [facility], [facility]  Social Connections: Unknown (09/16/2021)   Received from [facility], [facility]  Stress: No Stress Concern Present (12/23/2020)   Received from [facility], [facility]  Tobacco Use: Low Risk  (01/26/2023)    Readmission Risk Interventions    12/29/2022   11:05 AM 12/19/2022   11:42 AM 12/07/2022   11:22 AM  Readmission Risk Prevention Plan   Transportation Screening Complete Complete   Medication Review (RN Care Manager) Complete Complete   PCP or Specialist appointment within 3-5 days of discharge Complete Complete   HRI or Home Care Consult Complete Complete Complete  SW Recovery Care/Counseling Consult Complete Complete Complete  Palliative Care Screening Complete Not Applicable   Skilled Nursing Facility Complete Complete

## 2023-01-30 ENCOUNTER — Inpatient Hospital Stay: Payer: Medicare PPO

## 2023-01-30 ENCOUNTER — Inpatient Hospital Stay: Payer: Medicare PPO | Admitting: Physician Assistant

## 2023-01-30 DIAGNOSIS — N170 Acute kidney failure with tubular necrosis: Secondary | ICD-10-CM | POA: Diagnosis not present

## 2023-01-30 LAB — COMPREHENSIVE METABOLIC PANEL
ALT: 17 U/L (ref 0–44)
AST: 16 U/L (ref 15–41)
Albumin: 2.1 g/dL — ABNORMAL LOW (ref 3.5–5.0)
Alkaline Phosphatase: 99 U/L (ref 38–126)
Anion gap: 7 (ref 5–15)
BUN: 15 mg/dL (ref 8–23)
CO2: 26 mmol/L (ref 22–32)
Calcium: 7.7 mg/dL — ABNORMAL LOW (ref 8.9–10.3)
Chloride: 109 mmol/L (ref 98–111)
Creatinine, Ser: 0.84 mg/dL (ref 0.61–1.24)
GFR, Estimated: >60 mL/min (ref >60)
Glucose, Bld: 94 mg/dL (ref 70–99)
Potassium: 4 mmol/L (ref 3.5–5.1)
Sodium: 142 mmol/L (ref 135–145)
Total Bilirubin: 0.9 mg/dL (ref 0.3–1.2)
Total Protein: 4.2 g/dL — ABNORMAL LOW (ref 6.5–8.1)

## 2023-01-30 LAB — CBC WITH DIFFERENTIAL/PLATELET
Abs Immature Granulocytes: 0.05 10*3/uL (ref 0.00–0.07)
Basophils Absolute: 0 10*3/uL (ref 0.0–0.1)
Basophils Relative: 0 %
Eosinophils Absolute: 0 10*3/uL (ref 0.0–0.5)
Eosinophils Relative: 1 %
HCT: 22.3 % — ABNORMAL LOW (ref 39.0–52.0)
Hemoglobin: 7.2 g/dL — ABNORMAL LOW (ref 13.0–17.0)
Immature Granulocytes: 1 %
Lymphocytes Relative: 21 %
Lymphs Abs: 1.2 10*3/uL (ref 0.7–4.0)
MCH: 30.4 pg (ref 26.0–34.0)
MCHC: 32.3 g/dL (ref 30.0–36.0)
MCV: 94.1 fL (ref 80.0–100.0)
Monocytes Absolute: 0.5 10*3/uL (ref 0.1–1.0)
Monocytes Relative: 8 %
Neutro Abs: 3.9 10*3/uL (ref 1.7–7.7)
Neutrophils Relative %: 69 %
Platelets: 121 10*3/uL — ABNORMAL LOW (ref 150–400)
RBC: 2.37 MIL/uL — ABNORMAL LOW (ref 4.22–5.81)
RDW: 17 % — ABNORMAL HIGH (ref 11.5–15.5)
WBC: 5.6 10*3/uL (ref 4.0–10.5)
nRBC: 0 % (ref 0.0–0.2)

## 2023-01-30 LAB — GLUCOSE, CAPILLARY
Glucose-Capillary: 96 mg/dL (ref 70–99)
Glucose-Capillary: 183 mg/dL — ABNORMAL HIGH (ref 70–99)
Glucose-Capillary: 78 mg/dL (ref 70–99)
Glucose-Capillary: 187 mg/dL — ABNORMAL HIGH (ref 70–99)

## 2023-01-30 LAB — MAGNESIUM: Magnesium: 1.7 mg/dL (ref 1.7–2.4)

## 2023-01-30 MED ORDER — SODIUM CHLORIDE 0.9 % IV SOLN: INTRAVENOUS | Status: AC

## 2023-01-30 MED ORDER — SODIUM CHLORIDE 0.9 % IV BOLUS
1000.0000 mL | Freq: Once | INTRAVENOUS | Status: AC
  Administered 2023-01-30: 1000 mL via INTRAVENOUS

## 2023-01-30 MED ORDER — FLUDROCORTISONE ACETATE 0.1 MG PO TABS
0.1000 mg | ORAL_TABLET | Freq: Every day | ORAL | Status: DC
  Administered 2023-01-30 – 2023-02-01 (×3): 0.1 mg via ORAL
  Filled 2023-01-30 (×3): qty 1

## 2023-01-30 NOTE — Plan of Care (Signed)
  Problem: Education: Goal: Ability to describe self-care measures that may prevent or decrease complications (Diabetes Survival Skills Education) will improve Outcome: Progressing   Problem: Coping: Goal: Ability to adjust to condition or change in health will improve Outcome: Progressing   Problem: Fluid Volume: Goal: Ability to maintain a balanced intake and output will improve Outcome: Progressing   Problem: Health Behavior/Discharge Planning: Goal: Ability to identify and utilize available resources and services will improve Outcome: Progressing Goal: Ability to manage health-related needs will improve Outcome: Progressing   Problem: Metabolic: Goal: Ability to maintain appropriate glucose levels will improve Outcome: Progressing   Problem: Nutritional: Goal: Maintenance of adequate nutrition will improve Outcome: Progressing   Problem: Tissue Perfusion: Goal: Adequacy of tissue perfusion will improve Outcome: Progressing   Problem: Education: Goal: Knowledge of General Education information will improve Description: Including pain rating scale, medication(s)/side effects and non-pharmacologic comfort measures Outcome: Progressing   Problem: Clinical Measurements: Goal: Will remain free from infection Outcome: Progressing Goal: Diagnostic test results will improve Outcome: Progressing   Problem: Activity: Goal: Risk for activity intolerance will decrease Outcome: Progressing   Problem: Nutrition: Goal: Adequate nutrition will be maintained Outcome: Progressing   Problem: Elimination: Goal: Will not experience complications related to urinary retention Outcome: Progressing   Problem: Safety: Goal: Ability to remain free from injury will improve Outcome: Progressing   Problem: Skin Integrity: Goal: Risk for impaired skin integrity will decrease Outcome: Adequate for Discharge   Problem: Clinical Measurements: Goal: Respiratory complications will  improve Outcome: Adequate for Discharge Goal: Cardiovascular complication will be avoided Outcome: Adequate for Discharge   Problem: Pain Managment: Goal: General experience of comfort will improve Outcome: Adequate for Discharge   Problem: Skin Integrity: Goal: Risk for impaired skin integrity will decrease Outcome: Adequate for Discharge

## 2023-01-30 NOTE — Progress Notes (Signed)
[facility] Progress Note Patient: Scott Bass NWG:[id number] DOB: Aug 30, 1950 DOA: 01/26/2023  DOS: the patient was seen and examined on 01/30/2023  Brief hospital course: PMH of blindness, GERD, HLD, HTN, hypothyroidism podiatry, multiple myeloma on chemotherapy last session 9/17 presented to hospital with a fall.  Found to have anemia, hypokalemia, thrombocytopenia as well as AKI. Currently treated for orthostatic hypotension.  Assessment and Plan: AKI Baseline creatinine around 1. On presentation serum creatinine 2.05 with elevated BUN. Most likely in the setting of poor p.o. intake. Responded very well to IV hydration with normal serum creatinine night now. Monitor  Essential hypertension. Orthostatic hypotension. On metoprolol at home. Blood pressure was stable in supine position but on standing significantly dropped. Responding well to IV hydration. Will continue IV fluid overnight. Metoprolol on hold. Started on Florinef.  Multiple myeloma on chemotherapy. Chemotherapy and multiple myeloma induced anemia. SP blood transfusion.  Monitor H&H.  Transfuse 7 or hemodynamic instability. Prognosis poor. Oncology following.  Monitor.   Hypokalemia  Replaced.  Chronic blindness Noted.  Unwitnessed fall  No evidence of injury  CT of head and neck unremarkable  PT OT consulted.  Recommendation is for SNF although patient would like to go home.  Goals of care  currently DNR/DNI  Palliative care following.  Appreciate assistance.  Mood disorder. Patient has reported to RN as well as PT staff that he is feeling depressed. Palliative care is actually following the patient. We discussed with regards to initiating therapy with Remeron.    Subjective: Minimal oral intake.  No nausea no vomiting.  No other acute complaint.  Physical Exam: General: in Mild distress, No Rash Cardiovascular: S1 and S2 Present, No Murmur Respiratory: Good respiratory effort, Bilateral  Air entry present. No Crackles, No wheezes Abdomen: Bowel Sound present, No tenderness Extremities: No edema Neuro: Alert and oriented x3, no new focal deficit  Data Reviewed: I have Reviewed nursing notes, Vitals, and Lab results. Since last encounter, pertinent lab results CBC and BMP   . I have ordered test including CBC and BMP  .   Disposition: Status is: Inpatient Remains inpatient appropriate because: Need for further workup for orthostatic hypotension  Family Communication: No one at bedside Level of care: Progressive   Vitals:   01/30/23 0500 01/30/23 0732 01/30/23 0800 01/30/23 1150  BP:  129/68 135/70 120/79  Pulse:  70  78  Resp:  17  17  Temp:  98 F (36.7 C)  97.8 F (36.6 C)  TempSrc:  Oral  Oral  SpO2:  98%  99%  Weight: 69.5 kg     Height:         Author: Lynden Oxford, MD 01/30/2023 7:26 PM  Please look on [email or web address] to find out who is on call.

## 2023-01-30 NOTE — Care Management Important Message (Signed)
Important Message  Patient Details IM Letter given. Name: Scott Bass MRN: [id number] Date of Birth: 1950/06/17   Important Message Given:  Yes - Medicare IM     Caren Macadam 01/30/2023, 10:06 AM

## 2023-01-30 NOTE — Progress Notes (Signed)
Mobility Specialist - Progress Note   01/30/23 1127  Mobility  Activity Ambulated with assistance in hallway  Level of Assistance Minimal assist, patient does 75% or more  Assistive Device Front wheel walker  Distance Ambulated (ft) 150 ft  Range of Motion/Exercises Active  Activity Response Tolerated well  Mobility Referral Yes  $Mobility charge 1 Mobility  Mobility Specialist Start Time (ACUTE ONLY) 1110  Mobility Specialist Stop Time (ACUTE ONLY) 1127  Mobility Specialist Time Calculation (min) (ACUTE ONLY) 17 min   Pt was found in bed and needing some encouragement to ambulate. Pt c/o pain from L toe and stated having pain with minimal touch. Pt was min-A for bed mobility and CG for ambulation. Pt able to ambulate 5ft x3 with a seated rest break for ~22min in between. Pt grew fatigued with session. At EOS was left in bed with all needs met. Call bell in reach and bed alarm on.  Billey Chang Mobility Specialist

## 2023-01-31 DIAGNOSIS — E876 Hypokalemia: Secondary | ICD-10-CM | POA: Diagnosis not present

## 2023-01-31 DIAGNOSIS — D6481 Anemia due to antineoplastic chemotherapy: Secondary | ICD-10-CM | POA: Diagnosis not present

## 2023-01-31 DIAGNOSIS — C9000 Multiple myeloma not having achieved remission: Secondary | ICD-10-CM

## 2023-01-31 DIAGNOSIS — T451X5A Adverse effect of antineoplastic and immunosuppressive drugs, initial encounter: Secondary | ICD-10-CM | POA: Insufficient documentation

## 2023-01-31 DIAGNOSIS — N170 Acute kidney failure with tubular necrosis: Secondary | ICD-10-CM | POA: Diagnosis not present

## 2023-01-31 DIAGNOSIS — L8990 Pressure ulcer of unspecified site, unspecified stage: Secondary | ICD-10-CM | POA: Insufficient documentation

## 2023-01-31 DIAGNOSIS — N179 Acute kidney failure, unspecified: Secondary | ICD-10-CM | POA: Diagnosis not present

## 2023-01-31 LAB — CBC WITH DIFFERENTIAL/PLATELET
Abs Immature Granulocytes: 0.03 10*3/uL (ref 0.00–0.07)
Basophils Absolute: 0 10*3/uL (ref 0.0–0.1)
Basophils Relative: 0 %
Eosinophils Absolute: 0.1 10*3/uL (ref 0.0–0.5)
Eosinophils Relative: 1 %
HCT: 21.8 % — ABNORMAL LOW (ref 39.0–52.0)
Hemoglobin: 7 g/dL — ABNORMAL LOW (ref 13.0–17.0)
Immature Granulocytes: 1 %
Lymphocytes Relative: 19 %
Lymphs Abs: 1.1 10*3/uL (ref 0.7–4.0)
MCH: 30.7 pg (ref 26.0–34.0)
MCHC: 32.1 g/dL (ref 30.0–36.0)
MCV: 95.6 fL (ref 80.0–100.0)
Monocytes Absolute: 0.6 10*3/uL (ref 0.1–1.0)
Monocytes Relative: 10 %
Neutro Abs: 4.1 10*3/uL (ref 1.7–7.7)
Neutrophils Relative %: 69 %
Platelets: 136 10*3/uL — ABNORMAL LOW (ref 150–400)
RBC: 2.28 MIL/uL — ABNORMAL LOW (ref 4.22–5.81)
RDW: 17.1 % — ABNORMAL HIGH (ref 11.5–15.5)
WBC: 5.9 10*3/uL (ref 4.0–10.5)
nRBC: 0 % (ref 0.0–0.2)

## 2023-01-31 LAB — COMPREHENSIVE METABOLIC PANEL
ALT: 20 U/L (ref 0–44)
AST: 16 U/L (ref 15–41)
Albumin: 1.9 g/dL — ABNORMAL LOW (ref 3.5–5.0)
Alkaline Phosphatase: 99 U/L (ref 38–126)
Anion gap: 5 (ref 5–15)
BUN: 11 mg/dL (ref 8–23)
CO2: 24 mmol/L (ref 22–32)
Calcium: 7.1 mg/dL — ABNORMAL LOW (ref 8.9–10.3)
Chloride: 110 mmol/L (ref 98–111)
Creatinine, Ser: 0.92 mg/dL (ref 0.61–1.24)
GFR, Estimated: >60 mL/min (ref >60)
Glucose, Bld: 102 mg/dL — ABNORMAL HIGH (ref 70–99)
Potassium: 3.6 mmol/L (ref 3.5–5.1)
Sodium: 139 mmol/L (ref 135–145)
Total Bilirubin: 0.7 mg/dL (ref 0.3–1.2)
Total Protein: 4.1 g/dL — ABNORMAL LOW (ref 6.5–8.1)

## 2023-01-31 LAB — PREPARE RBC (CROSSMATCH)

## 2023-01-31 LAB — KAPPA/LAMBDA LIGHT CHAINS
Kappa free light chain: 9.2 mg/L (ref 3.3–19.4)
Kappa, lambda light chain ratio: 1.67 — ABNORMAL HIGH (ref 0.26–1.65)
Lambda free light chains: 5.5 mg/L — ABNORMAL LOW (ref 5.7–26.3)

## 2023-01-31 LAB — GLUCOSE, CAPILLARY
Glucose-Capillary: 184 mg/dL — ABNORMAL HIGH (ref 70–99)
Glucose-Capillary: 137 mg/dL — ABNORMAL HIGH (ref 70–99)
Glucose-Capillary: 110 mg/dL — ABNORMAL HIGH (ref 70–99)

## 2023-01-31 LAB — MAGNESIUM: Magnesium: 1.6 mg/dL — ABNORMAL LOW (ref 1.7–2.4)

## 2023-01-31 MED ORDER — SODIUM CHLORIDE 0.9% IV SOLUTION: Freq: Once | INTRAVENOUS | Status: DC

## 2023-01-31 MED ORDER — FUROSEMIDE 10 MG/ML IJ SOLN: 20.0000 mg | Freq: Once | INTRAMUSCULAR | Status: DC

## 2023-01-31 MED ORDER — FUROSEMIDE 10 MG/ML IJ SOLN
40.0000 mg | Freq: Once | INTRAMUSCULAR | Status: AC
  Administered 2023-02-01: 40 mg via INTRAVENOUS
  Filled 2023-01-31 (×2): qty 4

## 2023-01-31 MED ORDER — POTASSIUM CHLORIDE CRYS ER 20 MEQ PO TBCR
40.0000 meq | EXTENDED_RELEASE_TABLET | Freq: Two times a day (BID) | ORAL | Status: AC
  Administered 2023-01-31 (×2): 40 meq via ORAL
  Filled 2023-01-31 (×2): qty 2

## 2023-01-31 MED ORDER — MELATONIN 5 MG PO TABS
5.0000 mg | ORAL_TABLET | Freq: Once | ORAL | Status: AC
  Administered 2023-01-31: 5 mg via ORAL
  Filled 2023-01-31: qty 1

## 2023-01-31 NOTE — Plan of Care (Signed)
Problem: Fluid Volume: Goal: Ability to maintain a balanced intake and output will improve Outcome: Progressing   Problem: Health Behavior/Discharge Planning: Goal: Ability to manage health-related needs will improve Outcome: Progressing   Problem: Metabolic: Goal: Ability to maintain appropriate glucose levels will improve Outcome: Progressing

## 2023-01-31 NOTE — Progress Notes (Signed)
PT Cancellation Note  Patient Details Name: Scott Bass MRN: [id number] DOB: 15-Feb-1951   Cancelled Treatment:    Reason Eval/Treat Not Completed: Medical issues which prohibited therapy. Pt has low HgB of 7 and awaiting transfusion. PT to return later in day if schedule allows and continue to follow acutely.   [name], PT Acute Rehab   [name] 01/31/2023, 12:00 PM

## 2023-01-31 NOTE — Progress Notes (Signed)
[facility] PROGRESS NOTE    Progress Note  CAMP KREGER  ZOX:[id number] DOB: 06-17-1950 DOA: 01/26/2023 PCP: Tracey Harries, MD     Brief Narrative:   Scott Bass is an 72 y.o. male past medical history of blindness, essential hypertension hypothyroidism multiple myeloma on chemotherapy last treatment 01/23/2023 comes into the hospital after a fall found to hypokalemic thrombocytopenic and acute kidney injury.  Assessment/Plan:   Acute kidney injury (AKI) with acute tubular necrosis (ATN) (HCC) With a baseline creatinine around 1 on admission 2.0. Started on IV fluids now returned to baseline.  Hypokalemia: 3.6 this morning replete orally.  Essential hypertension: Blood pressure is improved we will probably need to go home off of metoprolol.  Multiple myeloma on chemotherapy anemia associated with chemotherapy:: Status post 1 unit of packed red blood cells, hemoglobin is slowly drifting down, will transfuse 1 unit packed red blood cells recheck in the morning.  Give Lasix IV posttransfusion. Recheck CBC posttransfusion of.  Blindness: Noted.  Unwitnessed fall: No evidence of fracture CT head and neck unremarkable. PT OT evaluated the patient will need skilled nursing facility placement.  Goals of care: DNR/DNI.  Mood disorder: Will need to be evaluated as an outpatient.  Stage II coccygeal pressure ulcer and stage I right heel ulcer present on admission RN Pressure Injury Documentation: Pressure Injury 01/26/23 Heel Right Stage 1 -  Intact skin with non-blanchable redness of a localized area usually over a bony prominence. (Active)  01/26/23 2307  Location: Heel  Location Orientation: Right  Staging: Stage 1 -  Intact skin with non-blanchable redness of a localized area usually over a bony prominence.  Wound Description (Comments):   Present on Admission: Yes  Dressing Type Foam - Lift dressing to assess site every shift 01/30/23 2046     Pressure  Injury 01/29/23 Coccyx Mid Stage 2 -  Partial thickness loss of dermis presenting as a shallow open injury with a red, pink wound bed without slough. 2 cm x 0.5 cm 100% pink and moist (Active)  01/29/23   Location: Coccyx  Location Orientation: Mid  Staging: Stage 2 -  Partial thickness loss of dermis presenting as a shallow open injury with a red, pink wound bed without slough.  Wound Description (Comments): 2 cm x 0.5 cm 100% pink and moist  Present on Admission: Yes  Dressing Type Foam - Lift dressing to assess site every shift 01/30/23 2046      DVT prophylaxis: lovenox Family Communication:none Status is: Inpatient Remains inpatient appropriate because: Acute kidney injury now with anemia    Code Status:     Code Status Orders  (From admission, onward)           Start     Ordered   01/26/23 1955  Do not attempt resuscitation (DNR)- Limited -Do Not Intubate (DNI)  Continuous       Question Answer Comment  If pulseless and not breathing No CPR or chest compressions.   In Pre-Arrest Conditions (Patient Is Breathing and Has A Pulse) Do not intubate. Provide all appropriate non-invasive medical interventions. Avoid ICU transfer unless indicated or required.   Consent: Discussion documented in EHR or advanced directives reviewed      01/26/23 1955           Code Status History     Date Active Date Inactive Code Status Order ID Comments User Context   12/07/2022 1328 12/29/2022 1728 DNR [id number]  Alena Bills, DO Inpatient   12/04/2022  5284 12/07/2022 1328 Full Code [id number]  Simonne Martinet, NP ED   09/25/2022 2254 10/01/2022 1703 Full Code [id number]  Darlin Drop, DO ED   12/18/2017 1250 12/19/2017 1229 Full Code [id number]  Elder Negus, MD Inpatient   02/17/2015 0611 02/18/2015 1856 Full Code [id number]  Eduard Clos, MD Inpatient         IV Access:   Peripheral IV   Procedures and diagnostic studies:   No results found.   Medical  Consultants:   None.   Subjective:    Scott Bass feels better no complaints just sad because he knows he is dying.  Objective:    Vitals:   01/30/23 1150 01/30/23 2020 01/31/23 0500 01/31/23 0513  BP: 120/79 (!) 115/58  138/74  Pulse: 78 84  73  Resp: 17 16  17   Temp: 97.8 F (36.6 C) 98.4 F (36.9 C)  98.4 F (36.9 C)  TempSrc: Oral Oral  Oral  SpO2: 99% 100%  98%  Weight:   73.3 kg   Height:       SpO2: 98 %   Intake/Output Summary (Last 24 hours) at 01/31/2023 0811 Last data filed at 01/31/2023 0515 Gross per 24 hour  Intake 2174.8 ml  Output 1600 ml  Net 574.8 ml   Filed Weights   01/29/23 0621 01/30/23 0500 01/31/23 0500  Weight: 65.6 kg 69.5 kg 73.3 kg    Exam: General exam: In no acute distress. Respiratory system: Good air movement and clear to auscultation. Cardiovascular system: S1 & S2 heard, RRR.  Gastrointestinal system: Abdomen is nondistended, soft and nontender.  Extremities: No pedal edema. Skin: No rashes, lesions or ulcers Psychiatry: Judgement and insight appear normal. Mood & affect appropriate.    Data Reviewed:    Labs: Basic Metabolic Panel: Recent Labs  Lab 01/27/23 0526 01/27/23 1542 01/28/23 0834 01/29/23 0459 01/30/23 0457 01/31/23 0530  NA 138 138 141 140 142 139  K 2.2* 2.9* 4.1 3.8 4.0 3.6  CL 101 104 107 103 109 110  CO2 27 23 26 26 26 24   GLUCOSE 133* 184* 125* 100* 94 102*  BUN 29* 25* 22 18 15 11   CREATININE 1.66* 1.56* 1.14 1.04 0.84 0.92  CALCIUM 7.4* 7.5* 7.9* 8.0* 7.7* 7.1*  MG 2.3  --  2.2 1.8 1.7 1.6*  PHOS 2.7  --   --   --   --   --    GFR Estimated Creatinine Clearance: 66.5 mL/min (by C-G formula based on SCr of 0.92 mg/dL). Liver Function Tests: Recent Labs  Lab 01/26/23 1805 01/28/23 0834 01/29/23 0459 01/30/23 0457 01/31/23 0530  AST 18 10* 13* 16 16  ALT 14 12 14 17 20   ALKPHOS 135* 109 106 99 99  BILITOT 0.8 1.0 1.1 0.9 0.7  PROT 5.1* 4.7* 4.4* 4.2* 4.1*  ALBUMIN 2.4* 2.3*  2.1* 2.1* 1.9*   Recent Labs  Lab 01/26/23 1805  LIPASE 32   No results for input(s): "AMMONIA" in the last 168 hours. Coagulation profile No results for input(s): "INR", "PROTIME" in the last 168 hours. COVID-19 Labs  No results for input(s): "DDIMER", "FERRITIN", "LDH", "CRP" in the last 72 hours.  Lab Results  Component Value Date   SARSCOV2NAA NEGATIVE 01/26/2023   SARSCOV2NAA NEGATIVE 12/04/2022    CBC: Recent Labs  Lab 01/27/23 0526 01/27/23 1542 01/28/23 0834 01/29/23 0459 01/30/23 0457 01/31/23 0530  WBC 3.9* 4.8 5.8 5.5 5.6 5.9  NEUTROABS 2.3  --  4.1  3.7 3.9 4.1  HGB 6.4* 7.6* 7.9* 7.7* 7.2* 7.0*  HCT 20.7* 23.5* 25.0* 24.3* 22.3* 21.8*  MCV 95.8 95.1 94.0 96.8 94.1 95.6  PLT 98* 95* 100* 107* 121* 136*   Cardiac Enzymes: No results for input(s): "CKTOTAL", "CKMB", "CKMBINDEX", "TROPONINI" in the last 168 hours. BNP (last 3 results) No results for input(s): "PROBNP" in the last 8760 hours. CBG: Recent Labs  Lab 01/29/23 2024 01/30/23 0739 01/30/23 1159 01/30/23 1647 01/30/23 2124  GLUCAP 110* 96 183* 78 187*   D-Dimer: No results for input(s): "DDIMER" in the last 72 hours. Hgb A1c: No results for input(s): "HGBA1C" in the last 72 hours. Lipid Profile: No results for input(s): "CHOL", "HDL", "LDLCALC", "TRIG", "CHOLHDL", "LDLDIRECT" in the last 72 hours. Thyroid function studies: No results for input(s): "TSH", "T4TOTAL", "T3FREE", "THYROIDAB" in the last 72 hours.  Invalid input(s): "FREET3" Anemia work up: No results for input(s): "VITAMINB12", "FOLATE", "FERRITIN", "TIBC", "IRON", "RETICCTPCT" in the last 72 hours. Sepsis Labs: Recent Labs  Lab 01/28/23 0834 01/29/23 0459 01/30/23 0457 01/31/23 0530  WBC 5.8 5.5 5.6 5.9   Microbiology Recent Results (from the past 240 hour(s))  Resp panel by RT-PCR (RSV, Flu A&B, Covid) Anterior Nasal Swab     Status: None   Collection Time: 01/26/23 10:23 PM   Specimen: Anterior Nasal Swab   Result Value Ref Range Status   SARS Coronavirus 2 by RT PCR NEGATIVE NEGATIVE Final    Comment: (NOTE) SARS-CoV-2 target nucleic acids are NOT DETECTED.  The SARS-CoV-2 RNA is generally detectable in upper respiratory specimens during the acute phase of infection. The lowest concentration of SARS-CoV-2 viral copies this assay can detect is 138 copies/mL. A negative result does not preclude SARS-Cov-2 infection and should not be used as the sole basis for treatment or other patient management decisions. A negative result may occur with  improper specimen collection/handling, submission of specimen other than nasopharyngeal swab, presence of viral mutation(s) within the areas targeted by this assay, and inadequate number of viral copies(<138 copies/mL). A negative result must be combined with clinical observations, patient history, and epidemiological information. The expected result is Negative.  Fact Sheet for Patients:  BloggerCourse.com  Fact Sheet for Healthcare Providers:  SeriousBroker.it  This test is no t yet approved or cleared by the [place] FDA and  has been authorized for detection and/or diagnosis of SARS-CoV-2 by FDA under an Emergency Use Authorization (EUA). This EUA will remain  in effect (meaning this test can be used) for the duration of the COVID-19 declaration under Section 564(b)(1) of the Act, 21 U.S.C.section 360bbb-3(b)(1), unless the authorization is terminated  or revoked sooner.       Influenza A by PCR NEGATIVE NEGATIVE Final   Influenza B by PCR NEGATIVE NEGATIVE Final    Comment: (NOTE) The Xpert Xpress SARS-CoV-2/FLU/RSV plus assay is intended as an aid in the diagnosis of influenza from Nasopharyngeal swab specimens and should not be used as a sole basis for treatment. Nasal washings and aspirates are unacceptable for Xpert Xpress SARS-CoV-2/FLU/RSV testing.  Fact Sheet for  Patients: BloggerCourse.com  Fact Sheet for Healthcare Providers: SeriousBroker.it  This test is not yet approved or cleared by the [place] FDA and has been authorized for detection and/or diagnosis of SARS-CoV-2 by FDA under an Emergency Use Authorization (EUA). This EUA will remain in effect (meaning this test can be used) for the duration of the COVID-19 declaration under Section 564(b)(1) of the Act, 21 U.S.C. section 360bbb-3(b)(1), unless  the authorization is terminated or revoked.     Resp Syncytial Virus by PCR NEGATIVE NEGATIVE Final    Comment: (NOTE) Fact Sheet for Patients: BloggerCourse.com  Fact Sheet for Healthcare Providers: SeriousBroker.it  This test is not yet approved or cleared by the [place] FDA and has been authorized for detection and/or diagnosis of SARS-CoV-2 by FDA under an Emergency Use Authorization (EUA). This EUA will remain in effect (meaning this test can be used) for the duration of the COVID-19 declaration under Section 564(b)(1) of the Act, 21 U.S.C. section 360bbb-3(b)(1), unless the authorization is terminated or revoked.  Performed at [facility], [address], [place], [place] [address]   Urine Culture     Status: None   Collection Time: 01/27/23  5:35 AM   Specimen: Urine, Clean Catch  Result Value Ref Range Status   Specimen Description   Final    URINE, CLEAN CATCH Performed at [facility], [address] [place], [place] [address]    Special Requests   Final    NONE Performed at [facility], [address] [place], [place] [address]    Culture   Final    NO GROWTH Performed at [facility], [address] [place], [place] [address]    Report Status 01/28/2023 FINAL  Final     Medications:    sodium chloride   Intravenous Once   brimonidine   1 drop Both Eyes TID   feeding supplement (GLUCERNA SHAKE)  237 mL Oral TID BM   ferrous sulfate  325 mg Oral QODAY   fludrocortisone  0.1 mg Oral Daily   insulin aspart  0-5 Units Subcutaneous QHS   insulin aspart  0-9 Units Subcutaneous TID WC   latanoprost  1 drop Both Eyes QPM   levothyroxine  150 mcg Oral Daily   magnesium oxide  400 mg Oral Daily   mirtazapine  7.5 mg Oral QHS   pantoprazole  40 mg Oral Daily   polyethylene glycol  17 g Oral Daily   sodium bicarbonate  1,300 mg Oral TID   Continuous Infusions:    LOS: 4 days   Marinda Elk  [facility]  01/31/2023, 8:11 AM

## 2023-01-31 NOTE — Progress Notes (Signed)
PT Cancellation Note  Patient Details Name: UZZIAH HUSER MRN: [id number] DOB: [birth year]   Cancelled Treatment:    Reason Eval/Treat Not Completed: Medical issues which prohibited therapy. PT returned later in the evening 1800 and communicated with nurse whom reports pt is still awaiting transfusion. HgB has not been reassessed since this am. Pt also has a new onset of uncontrolled loose bowels. PT to continue to follow acutely.  [name], PT Acute Rehab   [name] 01/31/2023, 6:02 PM

## 2023-01-31 NOTE — Progress Notes (Signed)
Occupational Therapy Treatment Patient Details Name: Scott Bass MRN: [id number] DOB: 1950-09-21 Today's Date: 01/31/2023   History of present illness Pt is a 72 yr old male presenting after a fall in the bathroom. Imaging negative for acute fx. Found to have AKI, hypokalemia, and anemia. PMH:  blindness, GERD, HLD, HTN, hypothyroidism, DM2, multiple myeloma on chemo.   OT comments  The pt was seen for functional strengthening, progression of out of bed activity, and facilitation of ADL participation. He performed grooming with set-up assist seated EOB, upper body dressing seated EOB, sit to stand using a RW, short distance ambulation into the hall using a RW, and simple self-feeding with set-up assist seated in the chair. He reported feelings of weakness in his back. He presented with good effort and participation. Continue OT plan of care.       If plan is discharge home, recommend the following:  A little help with walking and/or transfers;A little help with bathing/dressing/bathroom;Assistance with cooking/housework;Direct supervision/assist for financial management   Equipment Recommendations  BSC/3in1    Recommendations for Other Services      Precautions / Restrictions Precautions Precautions: Fall Precaution Comments: legally blind Restrictions Weight Bearing Restrictions: No       Mobility Bed Mobility Overal bed mobility: Needs Assistance Bed Mobility: Supine to Sit     Supine to sit: Supervision, HOB elevated          Transfers Overall transfer level: Needs assistance Equipment used: Rolling walker (2 wheels) Transfers: Sit to/from Stand Sit to Stand: Min assist                     ADL either performed or assessed with clinical judgement   ADL Overall ADL's : Needs assistance/impaired Eating/Feeding: Set up;Sitting Eating/Feeding Details (indicate cue type and reason): pt drank water from cup seated in the bedside chair Grooming: Set  up;Sitting Grooming Details (indicate cue type and reason): He performed face washing seated EOB.         Upper Body Dressing : Minimal assistance;Sitting Upper Body Dressing Details (indicate cue type and reason): He doffed a hospital gown, then donned another clean one seated EOB. Lower Body Dressing: Moderate assistance;Sit to/from stand                       Vision Baseline Vision/History: 2 Legally blind            Cognition Arousal: Alert Behavior During Therapy: WFL for tasks assessed/performed Overall Cognitive Status: Within Functional Limits for tasks assessed                    Pertinent Vitals/ Pain       Pain Assessment Pain Assessment: No/denies pain         Frequency  Min 1X/week        Progress Toward Goals  OT Goals(current goals can now be found in the care plan section)  Progress towards OT goals: Progressing toward goals  Acute Rehab OT Goals Patient Stated Goal: to return home at discharge OT Goal Formulation: With patient Time For Goal Achievement: 02/11/23 Potential to Achieve Goals: Good  Plan         AM-PAC OT "6 Clicks" Daily Activity     Outcome Measure   Help from another person eating meals?: A Little Help from another person taking care of personal grooming?: A Little Help from another person toileting, which includes using toliet, bedpan, or urinal?: A Little  Help from another person bathing (including washing, rinsing, drying)?: A Little Help from another person to put on and taking off regular upper body clothing?: A Little Help from another person to put on and taking off regular lower body clothing?: A Lot 6 Click Score: 17    End of Session Equipment Utilized During Treatment: Gait belt;Rolling walker (2 wheels)  OT Visit Diagnosis: Unsteadiness on feet (R26.81);Other abnormalities of gait and mobility (R26.89);Muscle weakness (generalized) (M62.81);Low vision, both eyes (H54.2)   Activity Tolerance  Patient tolerated treatment well   Patient Left in chair;with call bell/phone within reach;with chair alarm set   Nurse Communication Mobility status        Time: 1002-1016 OT Time Calculation (min): 14 min  Charges: OT General Charges $OT Visit: 1 Visit OT Treatments $Self Care/Home Management : 8-22 mins     Reuben Likes, OTR/L 01/31/2023, 12:03 PM

## 2023-02-01 DIAGNOSIS — E119 Type 2 diabetes mellitus without complications: Secondary | ICD-10-CM

## 2023-02-01 DIAGNOSIS — C9000 Multiple myeloma not having achieved remission: Secondary | ICD-10-CM | POA: Diagnosis not present

## 2023-02-01 DIAGNOSIS — E876 Hypokalemia: Secondary | ICD-10-CM | POA: Diagnosis not present

## 2023-02-01 DIAGNOSIS — N170 Acute kidney failure with tubular necrosis: Secondary | ICD-10-CM | POA: Diagnosis not present

## 2023-02-01 LAB — BPAM RBC
Blood Product Expiration Date: 202410172359
ISSUE DATE / TIME: 202409252351
Unit Type and Rh: 7300

## 2023-02-01 LAB — TYPE AND SCREEN
ABO/RH(D): B POS
Antibody Screen: POSITIVE
Unit division: 0

## 2023-02-01 LAB — BASIC METABOLIC PANEL
Anion gap: 7 (ref 5–15)
BUN: 10 mg/dL (ref 8–23)
CO2: 24 mmol/L (ref 22–32)
Calcium: 7.5 mg/dL — ABNORMAL LOW (ref 8.9–10.3)
Chloride: 108 mmol/L (ref 98–111)
Creatinine, Ser: 0.96 mg/dL (ref 0.61–1.24)
GFR, Estimated: >60 mL/min (ref >60)
Glucose, Bld: 95 mg/dL (ref 70–99)
Potassium: 4.1 mmol/L (ref 3.5–5.1)
Sodium: 139 mmol/L (ref 135–145)

## 2023-02-01 LAB — HEMOGLOBIN AND HEMATOCRIT, BLOOD
HCT: 29.3 % — ABNORMAL LOW (ref 39.0–52.0)
Hemoglobin: 9.2 g/dL — ABNORMAL LOW (ref 13.0–17.0)

## 2023-02-01 MED ORDER — FUROSEMIDE 10 MG/ML IJ SOLN: 40.0000 mg | Freq: Once | INTRAMUSCULAR | Status: DC

## 2023-02-01 MED ORDER — POTASSIUM CHLORIDE CRYS ER 20 MEQ PO TBCR: 40.0000 meq | EXTENDED_RELEASE_TABLET | Freq: Two times a day (BID) | ORAL | Status: DC

## 2023-02-01 MED ORDER — MIDODRINE HCL 2.5 MG PO TABS: 2.5000 mg | ORAL_TABLET | Freq: Two times a day (BID) | ORAL | 1 refills | Status: AC

## 2023-02-01 NOTE — TOC Transition Note (Addendum)
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 Met with pt at bedside, pt confirmed he needs assistance into his home and is spouse is unable to assist as she is blind as well. Will arrange PTAR for transport. New order for [facility] OT/PT. [facility], rep -Amy, accepted for [facility] PT/OT, added to AVS. No further TOC needs identified.     -11:25am DC nurse informed NCM pt reports he feels he will not be able to manage his medications at home, now requesting short term rehab. NCM outreached to pt's wife Scott Bass), reports pt is not able to dc to short term rehab as pt is past his 20 days at [facility] and they will now have to pay a daily copay which they are not financially able to pay at this time. NCM offered [facility] RN for medication management/teaching, wife agreeable. Request sent to attending to enter order for [facility] RN for medication teaching/management. PTAR called for Transport. No further TOC needs identified.       Barriers to Discharge: Continued Medical Work up   Patient Goals and CMS Choice      Discharge Placement                  Patient to be transferred to facility by: PTAR Name of family member notified: Scott Bass (spouse) Patient and family notified of of transfer: 02/01/23  Discharge Plan and Services Additional resources added to the After Visit Summary for     Discharge Planning Services: CM Consult                      HH Arranged: PT, OT [facility] Agency: [facility] Date [facility] Agency Contacted: 02/01/23 Time HH Agency Contacted: [phone] Representative spoke with at [facility] Agency: Amy  Social Determinants of Health (SDOH) Interventions SDOH Screenings   Food Insecurity: No Food Insecurity (01/28/2023)   Recent Concern: Food Insecurity - Food Insecurity Present (12/06/2022)  Housing: Low Risk  (01/28/2023)  Transportation Needs: No Transportation Needs (01/28/2023)  Utilities: Not At Risk (01/28/2023)  Financial Resource Strain: Low Risk  (10/10/2022)   Received from [facility], [facility]  Physical Activity: Insufficiently Active (12/23/2020)   Received from [facility], [facility]  Social Connections: Unknown (09/16/2021)   Received from [facility], [facility]  Stress: No Stress Concern Present (12/23/2020)   Received from [facility], [facility]  Tobacco Use: Low Risk  (01/26/2023)     Readmission Risk Interventions    02/01/2023   10:56 AM 12/29/2022   11:05 AM 12/19/2022   11:42 AM  Readmission Risk Prevention Plan  Transportation Screening Complete Complete Complete  PCP or Specialist Appt within 3-5 Days Complete    HRI or Home Care Consult Complete    Social Work Consult for Recovery Care Planning/Counseling Complete    Palliative Care Screening Not Applicable    Medication Review [employer]) Complete Complete Complete  PCP or Specialist appointment within 3-5 days of discharge  Complete Complete  HRI or Home Care Consult  Complete Complete  SW Recovery Care/Counseling Consult  Complete Complete  Palliative Care Screening  Complete Not Applicable  Skilled Nursing Facility  Complete Complete

## 2023-02-01 NOTE — Progress Notes (Signed)
This nurse was going over AVS discharge instructions with this patient and he expressed that he is worried about going home and managing his medications by himself. He wants to change his mind and go to rehab/SNF for assistance with this. MD, SW updated to patient's wishes.

## 2023-02-01 NOTE — Plan of Care (Signed)
Problem: Fluid Volume: Goal: Ability to maintain a balanced intake and output will improve Outcome: Progressing   Problem: Health Behavior/Discharge Planning: Goal: Ability to identify and utilize available resources and services will improve Outcome: Progressing   Problem: Skin Integrity: Goal: Risk for impaired skin integrity will decrease Outcome: Progressing

## 2023-02-01 NOTE — Discharge Summary (Signed)
Physician Discharge Summary  KENNISON CAMPI YNW:[id number] DOB: [birth year] DOA: 01/26/2023  PCP: Tracey Harries, MD  Admit date: 01/26/2023 Discharge date: 02/01/2023  Admitted From: Home Disposition:  Home, he refused skilled nursing facility  Recommendations for Outpatient Follow-up:  Follow up with oncology in 1-2 weeks, I will be to get idea to refer him to hospice to start addressing goals of care. Please obtain BMP/CBC in one week   Home Health:Yes Equipment/Devices:None  Discharge Condition:Guarded CODE STATUS:DNR Diet recommendation: Heart Healthy    Brief/Interim Summary: 72 y.o. male past medical history of blindness, essential hypertension hypothyroidism multiple myeloma on chemotherapy last treatment 01/23/2023 comes into the hospital after a fall found to hypokalemic thrombocytopenic and acute kidney injury.   Discharge Diagnoses:  Principal Problem:   Acute kidney injury (AKI) with acute tubular necrosis (ATN) (HCC) Active Problems:   Hypothyroidism   Diabetes mellitus type 2, controlled (HCC)   Essential hypertension   Multiple myeloma without remission (HCC)   Hypokalemia   Anemia due to antineoplastic chemotherapy   Pressure injury of skin  Acute kidney injury with acute tubular necrosis: With baseline creatinine of 1 admission 2.0.  Likely due to prerenal azotemia. He was started on IV fluids creatinine returned to baseline.  Hypokalemia: Replete orally now resolved.  Essential hypertension: He was on metoprolol at home this was discontinued blood pressures despite position was stable but upon standing it was significantly dropping. He did respond to IV hydration overnight. Metoprolol was discontinued he was started on midodrine which she will continue as an outpatient and titrate as tolerated as an outpatient.  Multiple myeloma on chemotherapy associated anemia secondary to chemotherapy: Status post 1 unit packed red blood cells he was given Lasix in  between transfusions his hemoglobin posttransfusion was 9.4 he will follow-up with oncology as an outpatient recheck hemoglobin at the follow-up appointment.  Blindness: Noted.  Unwitnessed fall: No evidence of fracture on CT and head was unremarkable. PT OT evaluated the patient recommended skilled nursing facility which he adamantly refused. I had a long conversation with him about going to skilled temporarily and he says he understands but he wants to go home as not much to live and he would like to spend his last few days weeks or months what ever it is at home not in a facility. It was explained to him the risk and benefits and he understands.  Goals of care: DNR/DNI.  Mood disorder: Need to be evaluated as an outpatient.  Stage II sacral decubitus ulcer present on admission/stage I right heel ulcer present on admission: Continue care per wound care instructions.  Discharge Instructions  Discharge Instructions     Diet - low sodium heart healthy   Complete by: As directed    Discharge wound care:   Complete by: As directed    Per wound care instructions   Increase activity slowly   Complete by: As directed       Allergies as of 02/01/2023       Reactions   Hctz [hydrochlorothiazide] Other (See Comments)   Pancreatitis         Medication List     STOP taking these medications    metFORMIN 1000 MG tablet Commonly known as: GLUCOPHAGE   metoprolol tartrate 50 MG tablet Commonly known as: LOPRESSOR       TAKE these medications    acetaminophen 500 MG tablet Commonly known as: TYLENOL Take 2 tablets (1,000 mg total) by mouth every 8 (eight) hours as  needed for moderate pain.   acyclovir 400 MG tablet Commonly known as: ZOVIRAX Take 1 tablet (400 mg total) by mouth 2 (two) times daily.   brimonidine 0.2 % ophthalmic solution Commonly known as: ALPHAGAN Place 1 drop into both eyes 3 (three) times daily.   ferrous sulfate 325 (65 FE) MG tablet Take  325 mg by mouth every other day.   folic acid 1 MG tablet Commonly known as: FOLVITE Take 1 tablet (1 mg total) by mouth daily.   insulin aspart 100 UNIT/ML injection Commonly known as: novoLOG Inject 0-6 Units into the skin 3 (three) times daily with meals. CBG < 70: treat low blood sugar CBG 70 - 120: 0 units CBG 121 - 150: 0 units CBG 151 - 200: 1 unit CBG 201-250: 2 units CBG 251-300: 3 units CBG 301-350: 4 units CBG 351-400: 5 units CBG > 400: Give 6 units and call MD   latanoprost 0.005 % ophthalmic solution Commonly known as: XALATAN Place 1 drop into both eyes every evening.   levothyroxine 150 MCG tablet Commonly known as: SYNTHROID Take 150 mcg by mouth daily.   magnesium oxide 400 (240 Mg) MG tablet Commonly known as: MAG-OX Take 400 mg by mouth daily.   Magnesium Oxide 400 MG Caps Take 400 mg by mouth daily.   midodrine 2.5 MG tablet Commonly known as: PROAMATINE Take 1 tablet (2.5 mg total) by mouth 2 (two) times daily with a meal.   pantoprazole 40 MG tablet Commonly known as: PROTONIX Take 1 tablet (40 mg total) by mouth daily.   polyethylene glycol 17 g packet Commonly known as: MIRALAX / GLYCOLAX Take 17 g by mouth daily.   rosuvastatin 10 MG tablet Commonly known as: CRESTOR Take 10 mg by mouth every evening.   sodium bicarbonate 650 MG tablet Take 2 tablets (1,300 mg total) by mouth 3 (three) times daily.   tamsulosin 0.4 MG Caps capsule Commonly known as: FLOMAX Take 0.4 mg by mouth daily.               Discharge Care Instructions  (From admission, onward)           Start     Ordered   02/01/23 0000  Discharge wound care:       Comments: Per wound care instructions   02/01/23 1020            Allergies  Allergen Reactions   Hctz [Hydrochlorothiazide] Other (See Comments)    Pancreatitis     Consultations: None   Procedures/Studies: DG Abd Portable 1V  Result Date: 01/28/2023 CLINICAL DATA:  No bowel  movement for 3 days EXAM: PORTABLE ABDOMEN - 1 VIEW COMPARISON:  12/19/2022 FINDINGS: Mild air distension of transverse colon and few air-filled loops small bowel but no convincing obstructive pattern. No radiopaque calculi. No significant stool burden. Lytic lesions within the pelvis corresponding to history of myeloma. IMPRESSION: Nonspecific nonobstructive bowel gas pattern without significant stool burden. Lytic lesions corresponding to history of multiple myeloma Electronically Signed   By: Jasmine Pang M.D.   On: 01/28/2023 19:01   DG Pelvis 1-2 Views  Result Date: 01/26/2023 CLINICAL DATA:  Status post recent fall. EXAM: PELVIS - 1-2 VIEW COMPARISON:  None Available. FINDINGS: There is no evidence of pelvic fracture or diastasis. Moderate severity degenerative changes are seen involving both hips, in the form of joint space narrowing, acetabular sclerosis and lateral acetabular bony spurring. No pelvic bone lesions are seen. IMPRESSION: Moderate severity degenerative changes  without an acute osseous abnormality. Electronically Signed   By: Aram Candela M.D.   On: 01/26/2023 19:12   DG Chest 2 View  Result Date: 01/26/2023 CLINICAL DATA:  Weakness, hypotension and recent falls. EXAM: CHEST - 2 VIEW COMPARISON:  December 27, 2022 FINDINGS: The heart size and mediastinal contours are within normal limits. There is marked severity calcification of the aortic arch. Both lungs are clear. Multilevel degenerative changes seen throughout the thoracic spine. IMPRESSION: No active cardiopulmonary disease. Electronically Signed   By: Aram Candela M.D.   On: 01/26/2023 19:09   CT Head Wo Contrast  Result Date: 01/26/2023 CLINICAL DATA:  Polytrauma, blunt.  History of multiple myeloma EXAM: CT HEAD WITHOUT CONTRAST CT CERVICAL SPINE WITHOUT CONTRAST TECHNIQUE: Multidetector CT imaging of the head and cervical spine was performed following the standard protocol without intravenous contrast. Multiplanar  CT image reconstructions of the cervical spine were also generated. RADIATION DOSE REDUCTION: This exam was performed according to the departmental dose-optimization program which includes automated exposure control, adjustment of the mA and/or kV according to patient size and/or use of iterative reconstruction technique. COMPARISON:  12/04/2022 FINDINGS: CT HEAD FINDINGS Brain: No evidence of acute infarction, hemorrhage, hydrocephalus, extra-axial collection or mass lesion/mass effect. Vascular: Atherosclerotic calcifications involving the large vessels of the skull base. No unexpected hyperdense vessel. Skull: Redemonstration of scattered lucent lesions throughout the bony calvarium. No acute calvarial fracture. Sinuses/Orbits: No acute finding. Other: Negative for scalp hematoma. CT CERVICAL SPINE FINDINGS Alignment: Facet joints are aligned without dislocation or traumatic listhesis. Dens and lateral masses are aligned. Skull base and vertebrae: No acute fracture is identified. Widespread lytic lesions throughout the visualized osseous structures compatible with known multiple myeloma. Soft tissues and spinal canal: No prevertebral fluid or swelling. No visible canal hematoma. Disc levels: Bulky bridging anterior endplate osteophytes spanning from C2 to C6. Relative preservation of the disc heights. No advanced facet arthropathy. Upper chest: Included lung apices are clear. Other: Bilateral carotid atherosclerosis. IMPRESSION: 1. No acute intracranial abnormality. 2. No acute fracture or subluxation of the cervical spine. 3. Widespread lytic lesions throughout the visualized osseous structures compatible with known multiple myeloma. Electronically Signed   By: Duanne Guess D.O.   On: 01/26/2023 18:53   CT Cervical Spine Wo Contrast  Result Date: 01/26/2023 CLINICAL DATA:  Polytrauma, blunt.  History of multiple myeloma EXAM: CT HEAD WITHOUT CONTRAST CT CERVICAL SPINE WITHOUT CONTRAST TECHNIQUE:  Multidetector CT imaging of the head and cervical spine was performed following the standard protocol without intravenous contrast. Multiplanar CT image reconstructions of the cervical spine were also generated. RADIATION DOSE REDUCTION: This exam was performed according to the departmental dose-optimization program which includes automated exposure control, adjustment of the mA and/or kV according to patient size and/or use of iterative reconstruction technique. COMPARISON:  12/04/2022 FINDINGS: CT HEAD FINDINGS Brain: No evidence of acute infarction, hemorrhage, hydrocephalus, extra-axial collection or mass lesion/mass effect. Vascular: Atherosclerotic calcifications involving the large vessels of the skull base. No unexpected hyperdense vessel. Skull: Redemonstration of scattered lucent lesions throughout the bony calvarium. No acute calvarial fracture. Sinuses/Orbits: No acute finding. Other: Negative for scalp hematoma. CT CERVICAL SPINE FINDINGS Alignment: Facet joints are aligned without dislocation or traumatic listhesis. Dens and lateral masses are aligned. Skull base and vertebrae: No acute fracture is identified. Widespread lytic lesions throughout the visualized osseous structures compatible with known multiple myeloma. Soft tissues and spinal canal: No prevertebral fluid or swelling. No visible canal hematoma. Disc levels: Bulky bridging  anterior endplate osteophytes spanning from C2 to C6. Relative preservation of the disc heights. No advanced facet arthropathy. Upper chest: Included lung apices are clear. Other: Bilateral carotid atherosclerosis. IMPRESSION: 1. No acute intracranial abnormality. 2. No acute fracture or subluxation of the cervical spine. 3. Widespread lytic lesions throughout the visualized osseous structures compatible with known multiple myeloma. Electronically Signed   By: Duanne Guess D.O.   On: 01/26/2023 18:53   (Echo, Carotid, EGD, Colonoscopy, ERCP)    Subjective: Once  to go home has no new complaints.  Discharge Exam: Vitals:   02/01/23 0315 02/01/23 0527  BP: 133/75 137/78  Pulse: 74 82  Resp: 18 18  Temp: 98.3 F (36.8 C) 99.3 F (37.4 C)  SpO2: 100% 97%   Vitals:   02/01/23 0030 02/01/23 0315 02/01/23 0430 02/01/23 0527  BP: 138/68 133/75  137/78  Pulse: 83 74  82  Resp: 18 18  18   Temp: 98.1 F (36.7 C) 98.3 F (36.8 C)  99.3 F (37.4 C)  TempSrc: Oral Oral  Oral  SpO2: 100% 100%  97%  Weight:   68.6 kg   Height:        General: Pt is alert, awake, not in acute distress Cardiovascular: RRR, S1/S2 +, no rubs, no gallops Respiratory: CTA bilaterally, no wheezing, no rhonchi Abdominal: Soft, NT, ND, bowel sounds + Extremities: no edema, no cyanosis    The results of significant diagnostics from this hospitalization (including imaging, microbiology, ancillary and laboratory) are listed below for reference.     Microbiology: Recent Results (from the past 240 hour(s))  Resp panel by RT-PCR (RSV, Flu A&B, Covid) Anterior Nasal Swab     Status: None   Collection Time: 01/26/23 10:23 PM   Specimen: Anterior Nasal Swab  Result Value Ref Range Status   SARS Coronavirus 2 by RT PCR NEGATIVE NEGATIVE Final    Comment: (NOTE) SARS-CoV-2 target nucleic acids are NOT DETECTED.  The SARS-CoV-2 RNA is generally detectable in upper respiratory specimens during the acute phase of infection. The lowest concentration of SARS-CoV-2 viral copies this assay can detect is 138 copies/mL. A negative result does not preclude SARS-Cov-2 infection and should not be used as the sole basis for treatment or other patient management decisions. A negative result may occur with  improper specimen collection/handling, submission of specimen other than nasopharyngeal swab, presence of viral mutation(s) within the areas targeted by this assay, and inadequate number of viral copies(<138 copies/mL). A negative result must be combined with clinical  observations, patient history, and epidemiological information. The expected result is Negative.  Fact Sheet for Patients:  BloggerCourse.com  Fact Sheet for Healthcare Providers:  SeriousBroker.it  This test is no t yet approved or cleared by the [place] FDA and  has been authorized for detection and/or diagnosis of SARS-CoV-2 by FDA under an Emergency Use Authorization (EUA). This EUA will remain  in effect (meaning this test can be used) for the duration of the COVID-19 declaration under Section 564(b)(1) of the Act, 21 U.S.C.section 360bbb-3(b)(1), unless the authorization is terminated  or revoked sooner.       Influenza A by PCR NEGATIVE NEGATIVE Final   Influenza B by PCR NEGATIVE NEGATIVE Final    Comment: (NOTE) The Xpert Xpress SARS-CoV-2/FLU/RSV plus assay is intended as an aid in the diagnosis of influenza from Nasopharyngeal swab specimens and should not be used as a sole basis for treatment. Nasal washings and aspirates are unacceptable for Xpert Xpress SARS-CoV-2/FLU/RSV testing.  Fact Sheet for Patients: BloggerCourse.com  Fact Sheet for Healthcare Providers: SeriousBroker.it  This test is not yet approved or cleared by the [place] FDA and has been authorized for detection and/or diagnosis of SARS-CoV-2 by FDA under an Emergency Use Authorization (EUA). This EUA will remain in effect (meaning this test can be used) for the duration of the COVID-19 declaration under Section 564(b)(1) of the Act, 21 U.S.C. section 360bbb-3(b)(1), unless the authorization is terminated or revoked.     Resp Syncytial Virus by PCR NEGATIVE NEGATIVE Final    Comment: (NOTE) Fact Sheet for Patients: BloggerCourse.com  Fact Sheet for Healthcare Providers: SeriousBroker.it  This test is not yet approved or cleared by  the [place] FDA and has been authorized for detection and/or diagnosis of SARS-CoV-2 by FDA under an Emergency Use Authorization (EUA). This EUA will remain in effect (meaning this test can be used) for the duration of the COVID-19 declaration under Section 564(b)(1) of the Act, 21 U.S.C. section 360bbb-3(b)(1), unless the authorization is terminated or revoked.  Performed at [facility], [address] [place], [place] [address]   Urine Culture     Status: None   Collection Time: 01/27/23  5:35 AM   Specimen: Urine, Clean Catch  Result Value Ref Range Status   Specimen Description   Final    URINE, CLEAN CATCH Performed at [facility], [address] [place], [place] [address]    Special Requests   Final    NONE Performed at [facility], [address] [place], [place] [address]    Culture   Final    NO GROWTH Performed at [facility], [address], [place], [place] [address]    Report Status 01/28/2023 FINAL  Final     Labs: BNP (last 3 results) No results for input(s): "BNP" in the last 8760 hours. Basic Metabolic Panel: Recent Labs  Lab 01/27/23 0526 01/27/23 1542 01/28/23 0834 01/29/23 0459 01/30/23 0457 01/31/23 0530 02/01/23 0440  NA 138   < > 141 140 142 139 139  K 2.2*   < > 4.1 3.8 4.0 3.6 4.1  CL 101   < > 107 103 109 110 108  CO2 27   < > 26 26 26 24 24   GLUCOSE 133*   < > 125* 100* 94 102* 95  BUN 29*   < > 22 18 15 11 10   CREATININE 1.66*   < > 1.14 1.04 0.84 0.92 0.96  CALCIUM 7.4*   < > 7.9* 8.0* 7.7* 7.1* 7.5*  MG 2.3  --  2.2 1.8 1.7 1.6*  --   PHOS 2.7  --   --   --   --   --   --    < > = values in this interval not displayed.   Liver Function Tests: Recent Labs  Lab 01/26/23 1805 01/28/23 0834 01/29/23 0459 01/30/23 0457 01/31/23 0530  AST 18 10* 13* 16 16  ALT 14 12 14 17 20   ALKPHOS 135* 109 106 99 99  BILITOT 0.8 1.0 1.1 0.9 0.7  PROT 5.1* 4.7* 4.4*  4.2* 4.1*  ALBUMIN 2.4* 2.3* 2.1* 2.1* 1.9*   Recent Labs  Lab 01/26/23 1805  LIPASE 32   No results for input(s): "AMMONIA" in the last 168 hours. CBC: Recent Labs  Lab 01/27/23 0526 01/27/23 1542 01/28/23 0834 01/29/23 0459 01/30/23 0457 01/31/23 0530 02/01/23 0658  WBC 3.9* 4.8 5.8 5.5 5.6 5.9  --  NEUTROABS 2.3  --  4.1 3.7 3.9 4.1  --   HGB 6.4* 7.6* 7.9* 7.7* 7.2* 7.0* 9.2*  HCT 20.7* 23.5* 25.0* 24.3* 22.3* 21.8* 29.3*  MCV 95.8 95.1 94.0 96.8 94.1 95.6  --   PLT 98* 95* 100* 107* 121* 136*  --    Cardiac Enzymes: No results for input(s): "CKTOTAL", "CKMB", "CKMBINDEX", "TROPONINI" in the last 168 hours. BNP: Invalid input(s): "POCBNP" CBG: Recent Labs  Lab 01/30/23 1647 01/30/23 2124 01/31/23 1209 01/31/23 1659 01/31/23 2115  GLUCAP 78 187* 184* 137* 110*   D-Dimer No results for input(s): "DDIMER" in the last 72 hours. Hgb A1c No results for input(s): "HGBA1C" in the last 72 hours. Lipid Profile No results for input(s): "CHOL", "HDL", "LDLCALC", "TRIG", "CHOLHDL", "LDLDIRECT" in the last 72 hours. Thyroid function studies No results for input(s): "TSH", "T4TOTAL", "T3FREE", "THYROIDAB" in the last 72 hours.  Invalid input(s): "FREET3" Anemia work up No results for input(s): "VITAMINB12", "FOLATE", "FERRITIN", "TIBC", "IRON", "RETICCTPCT" in the last 72 hours. Urinalysis    Component Value Date/Time   COLORURINE STRAW (A) 01/27/2023 0535   APPEARANCEUR CLEAR 01/27/2023 0535   LABSPEC 1.006 01/27/2023 0535   PHURINE 7.0 01/27/2023 0535   GLUCOSEU NEGATIVE 01/27/2023 0535   HGBUR NEGATIVE 01/27/2023 0535   BILIRUBINUR NEGATIVE 01/27/2023 0535   BILIRUBINUR small (A) 11/22/2015 0929   KETONESUR NEGATIVE 01/27/2023 0535   PROTEINUR NEGATIVE 01/27/2023 0535   UROBILINOGEN 0.2 11/22/2015 0929   UROBILINOGEN 1.0 02/17/2015 0021   NITRITE NEGATIVE 01/27/2023 0535   LEUKOCYTESUR NEGATIVE 01/27/2023 0535   Sepsis Labs Recent Labs  Lab  01/28/23 0834 01/29/23 0459 01/30/23 0457 01/31/23 0530  WBC 5.8 5.5 5.6 5.9   Microbiology Recent Results (from the past 240 hour(s))  Resp panel by RT-PCR (RSV, Flu A&B, Covid) Anterior Nasal Swab     Status: None   Collection Time: 01/26/23 10:23 PM   Specimen: Anterior Nasal Swab  Result Value Ref Range Status   SARS Coronavirus 2 by RT PCR NEGATIVE NEGATIVE Final    Comment: (NOTE) SARS-CoV-2 target nucleic acids are NOT DETECTED.  The SARS-CoV-2 RNA is generally detectable in upper respiratory specimens during the acute phase of infection. The lowest concentration of SARS-CoV-2 viral copies this assay can detect is 138 copies/mL. A negative result does not preclude SARS-Cov-2 infection and should not be used as the sole basis for treatment or other patient management decisions. A negative result may occur with  improper specimen collection/handling, submission of specimen other than nasopharyngeal swab, presence of viral mutation(s) within the areas targeted by this assay, and inadequate number of viral copies(<138 copies/mL). A negative result must be combined with clinical observations, patient history, and epidemiological information. The expected result is Negative.  Fact Sheet for Patients:  BloggerCourse.com  Fact Sheet for Healthcare Providers:  SeriousBroker.it  This test is no t yet approved or cleared by the [place] FDA and  has been authorized for detection and/or diagnosis of SARS-CoV-2 by FDA under an Emergency Use Authorization (EUA). This EUA will remain  in effect (meaning this test can be used) for the duration of the COVID-19 declaration under Section 564(b)(1) of the Act, 21 U.S.C.section 360bbb-3(b)(1), unless the authorization is terminated  or revoked sooner.       Influenza A by PCR NEGATIVE NEGATIVE Final   Influenza B by PCR NEGATIVE NEGATIVE Final    Comment: (NOTE) The Xpert Xpress  SARS-CoV-2/FLU/RSV plus assay is intended as an aid in the diagnosis of  influenza from Nasopharyngeal swab specimens and should not be used as a sole basis for treatment. Nasal washings and aspirates are unacceptable for Xpert Xpress SARS-CoV-2/FLU/RSV testing.  Fact Sheet for Patients: BloggerCourse.com  Fact Sheet for Healthcare Providers: SeriousBroker.it  This test is not yet approved or cleared by the [place] FDA and has been authorized for detection and/or diagnosis of SARS-CoV-2 by FDA under an Emergency Use Authorization (EUA). This EUA will remain in effect (meaning this test can be used) for the duration of the COVID-19 declaration under Section 564(b)(1) of the Act, 21 U.S.C. section 360bbb-3(b)(1), unless the authorization is terminated or revoked.     Resp Syncytial Virus by PCR NEGATIVE NEGATIVE Final    Comment: (NOTE) Fact Sheet for Patients: BloggerCourse.com  Fact Sheet for Healthcare Providers: SeriousBroker.it  This test is not yet approved or cleared by the [place] FDA and has been authorized for detection and/or diagnosis of SARS-CoV-2 by FDA under an Emergency Use Authorization (EUA). This EUA will remain in effect (meaning this test can be used) for the duration of the COVID-19 declaration under Section 564(b)(1) of the Act, 21 U.S.C. section 360bbb-3(b)(1), unless the authorization is terminated or revoked.  Performed at [facility], [address], [place], [place] [address]   Urine Culture     Status: None   Collection Time: 01/27/23  5:35 AM   Specimen: Urine, Clean Catch  Result Value Ref Range Status   Specimen Description   Final    URINE, CLEAN CATCH Performed at [facility], [address] [place], [place] [address]    Special Requests   Final    NONE Performed at [facility], [address] [place], [place] [address]    Culture   Final    NO GROWTH Performed at [facility], [address], [place], [place] [address]    Report Status 01/28/2023 FINAL  Final     Time coordinating discharge: Over 35 minutes  SIGNED:   Marinda Elk, MD  [facility] 02/01/2023, 10:27 AM Pager   If 7PM-7AM, please contact night-coverage [email or web address] Password [email or web address]

## 2023-02-01 NOTE — Progress Notes (Signed)
[facility] PROGRESS NOTE    Progress Note  Scott Bass  WGN:[id number] DOB: 10-Jun-1950 DOA: 01/26/2023 PCP: Tracey Harries, MD     Brief Narrative:   Scott Bass is an 72 y.o. male past medical history of blindness, essential hypertension hypothyroidism multiple myeloma on chemotherapy last treatment 01/23/2023 comes into the hospital after a fall found to hypokalemic thrombocytopenic and acute kidney injury.  Assessment/Plan:   Acute kidney injury (AKI) with acute tubular necrosis (ATN) (HCC) With a baseline creatinine around 1 on admission 2.0. Started on IV fluids now returned to baseline. Awaiting skilled nursing facility placement, TOC has been notified.  Hypokalemia: 3.6 this morning replete orally.  Essential hypertension: Blood pressure is improved we will probably need to go home off of metoprolol.  Multiple myeloma on chemotherapy anemia associated with chemotherapy:: Status post 1 unit of packed red blood cells, hemoglobin is slowly drifting down, will transfuse 1 unit packed red blood cells recheck in the morning.  Give Lasix IV posttransfusion. Recheck CBC posttransfusion of.  Blindness: Noted.  Unwitnessed fall: No evidence of fracture CT head and neck unremarkable. PT OT evaluated the patient will need skilled nursing facility placement.  Goals of care: DNR/DNI.  Mood disorder: Will need to be evaluated as an outpatient.  Stage II coccygeal pressure ulcer and stage I right heel ulcer present on admission RN Pressure Injury Documentation: Pressure Injury 01/26/23 Heel Right Stage 1 -  Intact skin with non-blanchable redness of a localized area usually over a bony prominence. (Active)  01/26/23 2307  Location: Heel  Location Orientation: Right  Staging: Stage 1 -  Intact skin with non-blanchable redness of a localized area usually over a bony prominence.  Wound Description (Comments):   Present on Admission: Yes  Dressing Type Foam - Lift  dressing to assess site every shift 01/31/23 1946     Pressure Injury 01/29/23 Coccyx Mid Stage 2 -  Partial thickness loss of dermis presenting as a shallow open injury with a red, pink wound bed without slough. 2 cm x 0.5 cm 100% pink and moist (Active)  01/29/23   Location: Coccyx  Location Orientation: Mid  Staging: Stage 2 -  Partial thickness loss of dermis presenting as a shallow open injury with a red, pink wound bed without slough.  Wound Description (Comments): 2 cm x 0.5 cm 100% pink and moist  Present on Admission: Yes  Dressing Type Foam - Lift dressing to assess site every shift 01/31/23 1946      DVT prophylaxis: lovenox Family Communication:none Status is: Inpatient Remains inpatient appropriate because: Acute kidney injury now with anemia    Code Status:     Code Status Orders  (From admission, onward)           Start     Ordered   01/26/23 1955  Do not attempt resuscitation (DNR)- Limited -Do Not Intubate (DNI)  Continuous       Question Answer Comment  If pulseless and not breathing No CPR or chest compressions.   In Pre-Arrest Conditions (Patient Is Breathing and Has A Pulse) Do not intubate. Provide all appropriate non-invasive medical interventions. Avoid ICU transfer unless indicated or required.   Consent: Discussion documented in EHR or advanced directives reviewed      01/26/23 1955           Code Status History     Date Active Date Inactive Code Status Order ID Comments User Context   12/07/2022 1328 12/29/2022 1728 DNR [id number]  Alena Bills, DO Inpatient   12/04/2022 1610 12/07/2022 1328 Full Code [id number]  Simonne Martinet, NP ED   09/25/2022 2254 10/01/2022 1703 Full Code [id number]  Darlin Drop, DO ED   12/18/2017 1250 12/19/2017 1229 Full Code [id number]  Elder Negus, MD Inpatient   02/17/2015 0611 02/18/2015 1856 Full Code [id number]  Eduard Clos, MD Inpatient         IV Access:   Peripheral IV   Procedures  and diagnostic studies:   No results found.   Medical Consultants:   None.   Subjective:    Scott Bass feels better no complaints just sad because he knows he is dying.  Objective:    Vitals:   02/01/23 0030 02/01/23 0315 02/01/23 0430 02/01/23 0527  BP: 138/68 133/75  137/78  Pulse: 83 74  82  Resp: 18 18  18   Temp: 98.1 F (36.7 C) 98.3 F (36.8 C)  99.3 F (37.4 C)  TempSrc: Oral Oral  Oral  SpO2: 100% 100%  97%  Weight:   68.6 kg   Height:       SpO2: 97 %   Intake/Output Summary (Last 24 hours) at 02/01/2023 1009 Last data filed at 02/01/2023 0600 Gross per 24 hour  Intake 1042.5 ml  Output 2850 ml  Net -1807.5 ml   Filed Weights   01/30/23 0500 01/31/23 0500 02/01/23 0430  Weight: 69.5 kg 73.3 kg 68.6 kg    Exam: General exam: In no acute distress. Respiratory system: Good air movement and clear to auscultation. Cardiovascular system: S1 & S2 heard, RRR.  Gastrointestinal system: Abdomen is nondistended, soft and nontender.  Extremities: No pedal edema. Skin: No rashes, lesions or ulcers Psychiatry: Judgement and insight appear normal. Mood & affect appropriate.    Data Reviewed:    Labs: Basic Metabolic Panel: Recent Labs  Lab 01/27/23 0526 01/27/23 1542 01/28/23 0834 01/29/23 0459 01/30/23 0457 01/31/23 0530 02/01/23 0440  NA 138   < > 141 140 142 139 139  K 2.2*   < > 4.1 3.8 4.0 3.6 4.1  CL 101   < > 107 103 109 110 108  CO2 27   < > 26 26 26 24 24   GLUCOSE 133*   < > 125* 100* 94 102* 95  BUN 29*   < > 22 18 15 11 10   CREATININE 1.66*   < > 1.14 1.04 0.84 0.92 0.96  CALCIUM 7.4*   < > 7.9* 8.0* 7.7* 7.1* 7.5*  MG 2.3  --  2.2 1.8 1.7 1.6*  --   PHOS 2.7  --   --   --   --   --   --    < > = values in this interval not displayed.   GFR Estimated Creatinine Clearance: 63.7 mL/min (by C-G formula based on SCr of 0.96 mg/dL). Liver Function Tests: Recent Labs  Lab 01/26/23 1805 01/28/23 0834 01/29/23 0459  01/30/23 0457 01/31/23 0530  AST 18 10* 13* 16 16  ALT 14 12 14 17 20   ALKPHOS 135* 109 106 99 99  BILITOT 0.8 1.0 1.1 0.9 0.7  PROT 5.1* 4.7* 4.4* 4.2* 4.1*  ALBUMIN 2.4* 2.3* 2.1* 2.1* 1.9*   Recent Labs  Lab 01/26/23 1805  LIPASE 32   No results for input(s): "AMMONIA" in the last 168 hours. Coagulation profile No results for input(s): "INR", "PROTIME" in the last 168 hours. COVID-19 Labs  No results for input(s): "DDIMER", "FERRITIN", "LDH", "  CRP" in the last 72 hours.  Lab Results  Component Value Date   SARSCOV2NAA NEGATIVE 01/26/2023   SARSCOV2NAA NEGATIVE 12/04/2022    CBC: Recent Labs  Lab 01/27/23 0526 01/27/23 1542 01/28/23 0834 01/29/23 0459 01/30/23 0457 01/31/23 0530 02/01/23 0658  WBC 3.9* 4.8 5.8 5.5 5.6 5.9  --   NEUTROABS 2.3  --  4.1 3.7 3.9 4.1  --   HGB 6.4* 7.6* 7.9* 7.7* 7.2* 7.0* 9.2*  HCT 20.7* 23.5* 25.0* 24.3* 22.3* 21.8* 29.3*  MCV 95.8 95.1 94.0 96.8 94.1 95.6  --   PLT 98* 95* 100* 107* 121* 136*  --    Cardiac Enzymes: No results for input(s): "CKTOTAL", "CKMB", "CKMBINDEX", "TROPONINI" in the last 168 hours. BNP (last 3 results) No results for input(s): "PROBNP" in the last 8760 hours. CBG: Recent Labs  Lab 01/30/23 1647 01/30/23 2124 01/31/23 1209 01/31/23 1659 01/31/23 2115  GLUCAP 78 187* 184* 137* 110*   D-Dimer: No results for input(s): "DDIMER" in the last 72 hours. Hgb A1c: No results for input(s): "HGBA1C" in the last 72 hours. Lipid Profile: No results for input(s): "CHOL", "HDL", "LDLCALC", "TRIG", "CHOLHDL", "LDLDIRECT" in the last 72 hours. Thyroid function studies: No results for input(s): "TSH", "T4TOTAL", "T3FREE", "THYROIDAB" in the last 72 hours.  Invalid input(s): "FREET3" Anemia work up: No results for input(s): "VITAMINB12", "FOLATE", "FERRITIN", "TIBC", "IRON", "RETICCTPCT" in the last 72 hours. Sepsis Labs: Recent Labs  Lab 01/28/23 0834 01/29/23 0459 01/30/23 0457 01/31/23 0530  WBC  5.8 5.5 5.6 5.9   Microbiology Recent Results (from the past 240 hour(s))  Resp panel by RT-PCR (RSV, Flu A&B, Covid) Anterior Nasal Swab     Status: None   Collection Time: 01/26/23 10:23 PM   Specimen: Anterior Nasal Swab  Result Value Ref Range Status   SARS Coronavirus 2 by RT PCR NEGATIVE NEGATIVE Final    Comment: (NOTE) SARS-CoV-2 target nucleic acids are NOT DETECTED.  The SARS-CoV-2 RNA is generally detectable in upper respiratory specimens during the acute phase of infection. The lowest concentration of SARS-CoV-2 viral copies this assay can detect is 138 copies/mL. A negative result does not preclude SARS-Cov-2 infection and should not be used as the sole basis for treatment or other patient management decisions. A negative result may occur with  improper specimen collection/handling, submission of specimen other than nasopharyngeal swab, presence of viral mutation(s) within the areas targeted by this assay, and inadequate number of viral copies(<138 copies/mL). A negative result must be combined with clinical observations, patient history, and epidemiological information. The expected result is Negative.  Fact Sheet for Patients:  BloggerCourse.com  Fact Sheet for Healthcare Providers:  SeriousBroker.it  This test is no t yet approved or cleared by the [place] FDA and  has been authorized for detection and/or diagnosis of SARS-CoV-2 by FDA under an Emergency Use Authorization (EUA). This EUA will remain  in effect (meaning this test can be used) for the duration of the COVID-19 declaration under Section 564(b)(1) of the Act, 21 U.S.C.section 360bbb-3(b)(1), unless the authorization is terminated  or revoked sooner.       Influenza A by PCR NEGATIVE NEGATIVE Final   Influenza B by PCR NEGATIVE NEGATIVE Final    Comment: (NOTE) The Xpert Xpress SARS-CoV-2/FLU/RSV plus assay is intended as an aid in the  diagnosis of influenza from Nasopharyngeal swab specimens and should not be used as a sole basis for treatment. Nasal washings and aspirates are unacceptable for Xpert Xpress SARS-CoV-2/FLU/RSV testing.  Fact Sheet for Patients: BloggerCourse.com  Fact Sheet for Healthcare Providers: SeriousBroker.it  This test is not yet approved or cleared by the [place] FDA and has been authorized for detection and/or diagnosis of SARS-CoV-2 by FDA under an Emergency Use Authorization (EUA). This EUA will remain in effect (meaning this test can be used) for the duration of the COVID-19 declaration under Section 564(b)(1) of the Act, 21 U.S.C. section 360bbb-3(b)(1), unless the authorization is terminated or revoked.     Resp Syncytial Virus by PCR NEGATIVE NEGATIVE Final    Comment: (NOTE) Fact Sheet for Patients: BloggerCourse.com  Fact Sheet for Healthcare Providers: SeriousBroker.it  This test is not yet approved or cleared by the [place] FDA and has been authorized for detection and/or diagnosis of SARS-CoV-2 by FDA under an Emergency Use Authorization (EUA). This EUA will remain in effect (meaning this test can be used) for the duration of the COVID-19 declaration under Section 564(b)(1) of the Act, 21 U.S.C. section 360bbb-3(b)(1), unless the authorization is terminated or revoked.  Performed at [facility], [address], [place], [place] [address]   Urine Culture     Status: None   Collection Time: 01/27/23  5:35 AM   Specimen: Urine, Clean Catch  Result Value Ref Range Status   Specimen Description   Final    URINE, CLEAN CATCH Performed at [facility], [address], [place], [place] [address]    Special Requests   Final    NONE Performed at [facility], [address], [place], [place] [address]     Culture   Final    NO GROWTH Performed at [facility], [address] [place], [place] [address]    Report Status 01/28/2023 FINAL  Final     Medications:    sodium chloride   Intravenous Once   sodium chloride   Intravenous Once   brimonidine  1 drop Both Eyes TID   feeding supplement (GLUCERNA SHAKE)  237 mL Oral TID BM   ferrous sulfate  325 mg Oral QODAY   fludrocortisone  0.1 mg Oral Daily   insulin aspart  0-5 Units Subcutaneous QHS   insulin aspart  0-9 Units Subcutaneous TID WC   latanoprost  1 drop Both Eyes QPM   levothyroxine  150 mcg Oral Daily   magnesium oxide  400 mg Oral Daily   mirtazapine  7.5 mg Oral QHS   pantoprazole  40 mg Oral Daily   polyethylene glycol  17 g Oral Daily   sodium bicarbonate  1,300 mg Oral TID   Continuous Infusions:    LOS: 5 days   Marinda Elk  [facility]  02/01/2023, 10:09 AM

## 2023-02-05 ENCOUNTER — Other Ambulatory Visit: Payer: Self-pay

## 2023-02-05 DIAGNOSIS — C9000 Multiple myeloma not having achieved remission: Secondary | ICD-10-CM

## 2023-02-06 ENCOUNTER — Inpatient Hospital Stay: Payer: Medicare PPO

## 2023-02-06 ENCOUNTER — Encounter: Payer: Self-pay | Admitting: Hematology

## 2023-02-06 ENCOUNTER — Inpatient Hospital Stay: Payer: Medicare PPO | Attending: Hematology

## 2023-02-06 ENCOUNTER — Inpatient Hospital Stay: Payer: Medicare PPO | Admitting: Hematology

## 2023-02-06 VITALS — BP 117/76 | HR 98 | Temp 97.3°F | Resp 18 | Wt 141.3 lb

## 2023-02-06 DIAGNOSIS — R1030 Lower abdominal pain, unspecified: Secondary | ICD-10-CM | POA: Insufficient documentation

## 2023-02-06 DIAGNOSIS — Z79899 Other long term (current) drug therapy: Secondary | ICD-10-CM | POA: Insufficient documentation

## 2023-02-06 DIAGNOSIS — Z7189 Other specified counseling: Secondary | ICD-10-CM

## 2023-02-06 DIAGNOSIS — M47814 Spondylosis without myelopathy or radiculopathy, thoracic region: Secondary | ICD-10-CM | POA: Insufficient documentation

## 2023-02-06 DIAGNOSIS — N189 Chronic kidney disease, unspecified: Secondary | ICD-10-CM | POA: Diagnosis not present

## 2023-02-06 DIAGNOSIS — I129 Hypertensive chronic kidney disease with stage 1 through stage 4 chronic kidney disease, or unspecified chronic kidney disease: Secondary | ICD-10-CM | POA: Insufficient documentation

## 2023-02-06 DIAGNOSIS — M2578 Osteophyte, vertebrae: Secondary | ICD-10-CM | POA: Diagnosis not present

## 2023-02-06 DIAGNOSIS — I700 Atherosclerosis of aorta: Secondary | ICD-10-CM | POA: Insufficient documentation

## 2023-02-06 DIAGNOSIS — E876 Hypokalemia: Secondary | ICD-10-CM | POA: Diagnosis not present

## 2023-02-06 DIAGNOSIS — G4733 Obstructive sleep apnea (adult) (pediatric): Secondary | ICD-10-CM | POA: Insufficient documentation

## 2023-02-06 DIAGNOSIS — M25551 Pain in right hip: Secondary | ICD-10-CM | POA: Diagnosis not present

## 2023-02-06 DIAGNOSIS — Z803 Family history of malignant neoplasm of breast: Secondary | ICD-10-CM | POA: Insufficient documentation

## 2023-02-06 DIAGNOSIS — Z8249 Family history of ischemic heart disease and other diseases of the circulatory system: Secondary | ICD-10-CM | POA: Insufficient documentation

## 2023-02-06 DIAGNOSIS — Z79620 Long term (current) use of immunosuppressive biologic: Secondary | ICD-10-CM | POA: Insufficient documentation

## 2023-02-06 DIAGNOSIS — R350 Frequency of micturition: Secondary | ICD-10-CM | POA: Insufficient documentation

## 2023-02-06 DIAGNOSIS — Z5112 Encounter for antineoplastic immunotherapy: Secondary | ICD-10-CM | POA: Insufficient documentation

## 2023-02-06 DIAGNOSIS — F32A Depression, unspecified: Secondary | ICD-10-CM | POA: Insufficient documentation

## 2023-02-06 DIAGNOSIS — D696 Thrombocytopenia, unspecified: Secondary | ICD-10-CM | POA: Diagnosis not present

## 2023-02-06 DIAGNOSIS — E1122 Type 2 diabetes mellitus with diabetic chronic kidney disease: Secondary | ICD-10-CM | POA: Insufficient documentation

## 2023-02-06 DIAGNOSIS — R634 Abnormal weight loss: Secondary | ICD-10-CM | POA: Diagnosis not present

## 2023-02-06 DIAGNOSIS — M25552 Pain in left hip: Secondary | ICD-10-CM | POA: Diagnosis not present

## 2023-02-06 DIAGNOSIS — H409 Unspecified glaucoma: Secondary | ICD-10-CM | POA: Diagnosis not present

## 2023-02-06 DIAGNOSIS — I6523 Occlusion and stenosis of bilateral carotid arteries: Secondary | ICD-10-CM | POA: Insufficient documentation

## 2023-02-06 DIAGNOSIS — C9000 Multiple myeloma not having achieved remission: Secondary | ICD-10-CM

## 2023-02-06 DIAGNOSIS — N179 Acute kidney failure, unspecified: Secondary | ICD-10-CM | POA: Diagnosis not present

## 2023-02-06 DIAGNOSIS — Z833 Family history of diabetes mellitus: Secondary | ICD-10-CM | POA: Insufficient documentation

## 2023-02-06 DIAGNOSIS — H548 Legal blindness, as defined in USA: Secondary | ICD-10-CM | POA: Insufficient documentation

## 2023-02-06 DIAGNOSIS — Z9181 History of falling: Secondary | ICD-10-CM | POA: Insufficient documentation

## 2023-02-06 DIAGNOSIS — D649 Anemia, unspecified: Secondary | ICD-10-CM | POA: Diagnosis not present

## 2023-02-06 DIAGNOSIS — R0789 Other chest pain: Secondary | ICD-10-CM | POA: Diagnosis not present

## 2023-02-06 DIAGNOSIS — Z79624 Long term (current) use of inhibitors of nucleotide synthesis: Secondary | ICD-10-CM | POA: Insufficient documentation

## 2023-02-06 DIAGNOSIS — R110 Nausea: Secondary | ICD-10-CM | POA: Diagnosis not present

## 2023-02-06 LAB — CBC WITH DIFFERENTIAL (CANCER CENTER ONLY)
Abs Immature Granulocytes: 0.02 10*3/uL (ref 0.00–0.07)
Basophils Absolute: 0 10*3/uL (ref 0.0–0.1)
Basophils Relative: 0 %
Eosinophils Absolute: 0 10*3/uL (ref 0.0–0.5)
Eosinophils Relative: 0 %
HCT: 29.8 % — ABNORMAL LOW (ref 39.0–52.0)
Hemoglobin: 9.7 g/dL — ABNORMAL LOW (ref 13.0–17.0)
Immature Granulocytes: 0 %
Lymphocytes Relative: 20 %
Lymphs Abs: 1.2 10*3/uL (ref 0.7–4.0)
MCH: 30.5 pg (ref 26.0–34.0)
MCHC: 32.6 g/dL (ref 30.0–36.0)
MCV: 93.7 fL (ref 80.0–100.0)
Monocytes Absolute: 0.6 10*3/uL (ref 0.1–1.0)
Monocytes Relative: 10 %
Neutro Abs: 3.9 10*3/uL (ref 1.7–7.7)
Neutrophils Relative %: 70 %
Platelet Count: 317 10*3/uL (ref 150–400)
RBC: 3.18 MIL/uL — ABNORMAL LOW (ref 4.22–5.81)
RDW: 16.9 % — ABNORMAL HIGH (ref 11.5–15.5)
WBC Count: 5.7 10*3/uL (ref 4.0–10.5)
nRBC: 0 % (ref 0.0–0.2)

## 2023-02-06 LAB — CMP (CANCER CENTER ONLY)
ALT: 8 U/L (ref 0–44)
AST: 10 U/L — ABNORMAL LOW (ref 15–41)
Albumin: 3.2 g/dL — ABNORMAL LOW (ref 3.5–5.0)
Alkaline Phosphatase: 118 U/L (ref 38–126)
Anion gap: 10 (ref 5–15)
BUN: 9 mg/dL (ref 8–23)
CO2: 22 mmol/L (ref 22–32)
Calcium: 8.4 mg/dL — ABNORMAL LOW (ref 8.9–10.3)
Chloride: 112 mmol/L — ABNORMAL HIGH (ref 98–111)
Creatinine: 0.91 mg/dL (ref 0.61–1.24)
GFR, Estimated: >60 mL/min (ref >60)
Glucose, Bld: 94 mg/dL (ref 70–99)
Potassium: 3.3 mmol/L — ABNORMAL LOW (ref 3.5–5.1)
Sodium: 144 mmol/L (ref 135–145)
Total Bilirubin: 1 mg/dL (ref 0.3–1.2)
Total Protein: 5.6 g/dL — ABNORMAL LOW (ref 6.5–8.1)

## 2023-02-06 LAB — MULTIPLE MYELOMA PANEL, SERUM
Albumin SerPl Elph-Mcnc: 2.1 g/dL — ABNORMAL LOW (ref 2.9–4.4)
Albumin/Glob SerPl: 1.2 (ref 0.7–1.7)
Alpha 1: 0.3 g/dL (ref 0.0–0.4)
Alpha2 Glob SerPl Elph-Mcnc: 0.6 g/dL (ref 0.4–1.0)
B-Globulin SerPl Elph-Mcnc: 0.6 g/dL — ABNORMAL LOW (ref 0.7–1.3)
Gamma Glob SerPl Elph-Mcnc: 0.4 g/dL (ref 0.4–1.8)
Globulin, Total: 1.9 g/dL — ABNORMAL LOW (ref 2.2–3.9)
IgA: 35 mg/dL — ABNORMAL LOW (ref 61–437)
IgG (Immunoglobin G), Serum: 548 mg/dL — ABNORMAL LOW (ref 603–1613)
IgM (Immunoglobulin M), Srm: 19 mg/dL (ref 15–143)
M Protein SerPl Elph-Mcnc: 0.1 g/dL — ABNORMAL HIGH
Total Protein ELP: 4 g/dL — ABNORMAL LOW (ref 6.0–8.5)

## 2023-02-06 MED ORDER — DARATUMUMAB-HYALURONIDASE-FIHJ 1800-30000 MG-UT/15ML ~~LOC~~ SOLN
1800.0000 mg | Freq: Once | SUBCUTANEOUS | Status: AC
  Administered 2023-02-06: 1800 mg via SUBCUTANEOUS
  Filled 2023-02-06: qty 15

## 2023-02-06 MED ORDER — FAMOTIDINE 20 MG PO TABS
20.0000 mg | ORAL_TABLET | Freq: Once | ORAL | Status: AC
  Administered 2023-02-06: 20 mg via ORAL
  Filled 2023-02-06: qty 1

## 2023-02-06 MED ORDER — DIPHENHYDRAMINE HCL 25 MG PO CAPS
50.0000 mg | ORAL_CAPSULE | Freq: Once | ORAL | Status: AC
  Administered 2023-02-06: 50 mg via ORAL
  Filled 2023-02-06: qty 2

## 2023-02-06 MED ORDER — DEXAMETHASONE 4 MG PO TABS
20.0000 mg | ORAL_TABLET | Freq: Once | ORAL | Status: AC
  Administered 2023-02-06: 20 mg via ORAL
  Filled 2023-02-06: qty 5

## 2023-02-06 MED ORDER — ACETAMINOPHEN 325 MG PO TABS
650.0000 mg | ORAL_TABLET | Freq: Once | ORAL | Status: AC
  Administered 2023-02-06: 650 mg via ORAL
  Filled 2023-02-06: qty 2

## 2023-02-06 NOTE — Progress Notes (Signed)
HEMATOLOGY/ONCOLOGY CLINIC NOTE  Date of Service: 02/06/2023  Patient Care Team: Tracey Harries, MD as PCP - General (Family Medicine)  CHIEF COMPLAINTS/PURPOSE OF CONSULTATION:  Evaluation and management of multiple myeloma.  HISTORY OF PRESENTING ILLNESS:  Scott Bass is a wonderful 72 y.o. male who has been referred to [place] by Tracey Harries, MD for evaluation and management of multiple myeloma.  Patient was in ED for symptomatic anemia on 09/08/2022. Patient presented to the ED and was hospitalized on 09/25/2022 for acute renal failure.   Today, he presents in a wheelchair and is accompanied by his wife. He reports that he is intermittently nauseous. He does not take anything to manage nausea. He denies any acid reflux symptoms.  He does have mild abdominal pain. He has had one bowel movement this week and does endorse a cycle of constipation and diarrhea. He reports recent poor p.o intake due to loss of appetite over the last month. He reports a weight loss of 5-6 pounds. He denies any leg swelling, radiation exposure, or neuropathy.  He reports that receiving blood transfusions during his hospitalization did improve energy levels. He was given Oxycodone at the hospital which he has run out of. He currently uses Tylenol for pain management.   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 Patient's wife is also blind. He reports that he is able to manage daily activities with his wife at home.   Patient suffered a fall from a chair after it broke. He reports worsened fatigue which began one month ago. He denies any syncope or lightheadedness/dizziness.  He complains of chest wall pain as well as left hip pain in the groin and laterally. This pain has improved.   His DM2 is fairly well-controlled. He reports that he needs a Metformin refill. He has self-discontinued Jardiance 1-2 months ago as he thought that it would affect blood sugars levels. His blood sugars at home have been  fairly stable with its highest level at 145.   His wife reports that patient has frequent urination with a burning sensation. Patient's wife reports that his confusion has improved by 80%. Patient reports that he is occasionally confused. She reports that patient may need assistance with bedside commode. Patient reports that he hospital has initiated home care services. Patient reports that he does not have a nephrologist.  INTERVAL HISTORY:  Scott Bass is a 72 y.o. male here for continued evaluation and management of multiple myeloma. He is here for his cycle 2 day 22 treatment during this visit.   Patient was last seen by me as in-patient on 01/29/2023. He was recently discharged from a rehab facility, but then he had a recent fall which was the reason he was admitted. He was found to be hypokalemic thrombocytopenic and had an acute kidney injury from the fall. He was admitted from 01/26/2023 to 02/01/2023.   Patient is accompanied by his wife during this visit. Patient notes he has been doing well overall since he got discharged from the hospital. He has been eating well since our last visit. He does complain of right hip pain, from his fall, which has been improving.   Patient notes he has been slowly walking around his house with walking stick.   He denies any new infection issues, fever, chills, night sweats, nauseous, abnormal bowel movement, unexpected weight loss, chest pain, back pain, abdominal pain, or leg swelling. He denies any recent fall since he got discharged.   Patient notes that  he has wound on his lower and upper back. He has not been scheduled for home care at discharge.   Patient's wife notes that the patient is mildly depressed due to hospitalizations. Patient is worried about his wife if anything happens to him.     MEDICAL HISTORY:  Past Medical History:  Diagnosis Date   Arthritis    "lower back" (12/18/2017)   Better eye: moderate vision impairment; lesser  eye: blind    GERD (gastroesophageal reflux disease)    Glaucoma, both eyes    High cholesterol    Hypertension    Hypothyroidism    Irregular heartbeat    Legally blind    "both eyes; can see some out of left eye"   OSA (obstructive sleep apnea)    Thyroid disease    Type II diabetes mellitus (HCC)    Vitamin B12 deficiency     SURGICAL HISTORY: Past Surgical History:  Procedure Laterality Date   LEFT HEART CATH AND CORONARY ANGIOGRAPHY N/A 12/18/2017   Procedure: LEFT HEART CATH AND CORONARY ANGIOGRAPHY;  Surgeon: Elder Negus, MD;  Location: [facility];  Service: Cardiovascular;  Laterality: N/A;   NOSE SURGERY     "was crooked; they straightened it out" (12/18/2017)    SOCIAL HISTORY: Social History   Socioeconomic History   Marital status: Married    Spouse name: Not on file   Number of children: 0   Years of education: Not on file   Highest education level: Not on file  Occupational History   Occupation: retired  Tobacco Use   Smoking status: Never   Smokeless tobacco: Never  Vaping Use   Vaping status: Never Used  Substance and Sexual Activity   Alcohol use: Not Currently    Comment: occ   Drug use: Never   Sexual activity: Not Currently  Other Topics Concern   Not on file  Social History Narrative   Not on file   Social Determinants of Health   Financial Resource Strain: Low Risk  (10/10/2022)   Received from [facility], [facility]   Overall Financial Resource Strain (CARDIA)    Difficulty of Paying Living Expenses: Not very hard  Food Insecurity: No Food Insecurity (01/28/2023)   Hunger Vital Sign    Worried About Running Out of Food in the Last Year: Never true    Ran Out of Food in the Last Year: Never true  Recent Concern: Food Insecurity - Food Insecurity Present (12/06/2022)   Hunger Vital Sign    Worried About Running Out of Food in the Last Year: Often true    Ran Out of Food in the Last Year: Often true  Transportation  Needs: No Transportation Needs (01/28/2023)   PRAPARE - Administrator, Civil Service (Medical): No    Lack of Transportation (Non-Medical): No  Physical Activity: Insufficiently Active (12/23/2020)   Received from [facility], [facility]   Exercise Vital Sign    Days of Exercise per Week: 2 days    Minutes of Exercise per Session: 10 min  Stress: No Stress Concern Present (12/23/2020)   Received from [facility], [facility] - Occupational Stress Questionnaire    Feeling of Stress : Not at all  Social Connections: Unknown (09/16/2021)   Received from [facility], [facility]   Social Network    Social Network: Not on file  Intimate Partner Violence: Not At Risk (01/28/2023)   Humiliation, Afraid,  Rape, and Kick questionnaire    Fear of Current or Ex-Partner: No    Emotionally Abused: No    Physically Abused: No    Sexually Abused: No    FAMILY HISTORY: Family History  Problem Relation Age of Onset   Diabetes Mellitus II Mother 28   Diabetes Mellitus II Sister    Breast cancer Sister    Heart failure Sister 43    ALLERGIES:  is allergic to hctz [hydrochlorothiazide].  MEDICATIONS:  Current Outpatient Medications  Medication Sig Dispense Refill   acetaminophen (TYLENOL) 500 MG tablet Take 2 tablets (1,000 mg total) by mouth every 8 (eight) hours as needed for moderate pain.     acyclovir (ZOVIRAX) 400 MG tablet Take 1 tablet (400 mg total) by mouth 2 (two) times daily. (Patient not taking: Reported on 01/27/2023) 60 tablet 5   brimonidine (ALPHAGAN) 0.2 % ophthalmic solution Place 1 drop into both eyes 3 (three) times daily.   0   ferrous sulfate 325 (65 FE) MG tablet Take 325 mg by mouth every other day.     folic acid (FOLVITE) 1 MG tablet Take 1 tablet (1 mg total) by mouth daily.     insulin aspart (NOVOLOG) 100 UNIT/ML injection Inject 0-6 Units into the skin 3 (three) times daily with meals. CBG < 70: treat  low blood sugar CBG 70 - 120: 0 units CBG 121 - 150: 0 units CBG 151 - 200: 1 unit CBG 201-250: 2 units CBG 251-300: 3 units CBG 301-350: 4 units CBG 351-400: 5 units CBG > 400: Give 6 units and call MD     latanoprost (XALATAN) 0.005 % ophthalmic solution Place 1 drop into both eyes every evening.     levothyroxine (SYNTHROID, LEVOTHROID) 150 MCG tablet Take 150 mcg by mouth daily.  0   magnesium oxide (MAG-OX) 400 (240 Mg) MG tablet Take 400 mg by mouth daily.     Magnesium Oxide 400 MG CAPS Take 400 mg by mouth daily.     midodrine (PROAMATINE) 2.5 MG tablet Take 1 tablet (2.5 mg total) by mouth 2 (two) times daily with a meal. 60 tablet 1   pantoprazole (PROTONIX) 40 MG tablet Take 1 tablet (40 mg total) by mouth daily.     polyethylene glycol (MIRALAX / GLYCOLAX) 17 g packet Take 17 g by mouth daily.     rosuvastatin (CRESTOR) 10 MG tablet Take 10 mg by mouth every evening.     sodium bicarbonate 650 MG tablet Take 2 tablets (1,300 mg total) by mouth 3 (three) times daily.     tamsulosin (FLOMAX) 0.4 MG CAPS capsule Take 0.4 mg by mouth daily.     No current facility-administered medications for this visit.    REVIEW OF SYSTEMS:    10 Point review of Systems was done is negative except as noted above.  PHYSICAL EXAMINATION: ECOG PERFORMANCE STATUS: 3 - Symptomatic, >50% confined to bed  . There were no vitals filed for this visit.  There were no vitals filed for this visit. .There is no height or weight on file to calculate BMI.  GENERAL:alert, in no acute distress and comfortable SKIN: no acute rashes, no significant lesions EYES: conjunctiva are pink and non-injected, sclera anicteric OROPHARYNX: MMM, no exudates, no oropharyngeal erythema or ulceration NECK: supple, no JVD LYMPH:  no palpable lymphadenopathy in the cervical, axillary or inguinal regions LUNGS: clear to auscultation b/l with normal respiratory effort HEART: regular rate & rhythm ABDOMEN:   normoactive bowel sounds ,  non tender, not distended. Extremity: no pedal edema PSYCH: alert & oriented x 3 with fluent speech NEURO: no focal motor/sensory deficits  LABORATORY DATA:  I have reviewed the data as listed .    Latest Ref Rng & Units 02/01/2023    6:58 AM 01/31/2023    5:30 AM 01/30/2023    4:57 AM  CBC  WBC 4.0 - 10.5 K/uL  5.9  5.6   Hemoglobin 13.0 - 17.0 g/dL 9.2  7.0  7.2   Hematocrit 39.0 - 52.0 % 29.3  21.8  22.3   Platelets 150 - 400 K/uL  136  121    .    Latest Ref Rng & Units 02/01/2023    4:40 AM 01/31/2023    5:30 AM 01/30/2023    4:57 AM  CMP  Glucose 70 - 99 mg/dL 95  409  94   BUN 8 - 23 mg/dL 10  11  15    Creatinine 0.61 - 1.24 mg/dL 8.11  9.14  7.82   Sodium 135 - 145 mmol/L 139  139  142   Potassium 3.5 - 5.1 mmol/L 4.1  3.6  4.0   Chloride 98 - 111 mmol/L 108  110  109   CO2 22 - 32 mmol/L 24  24  26    Calcium 8.9 - 10.3 mg/dL 7.5  7.1  7.7   Total Protein 6.5 - 8.1 g/dL  4.1  4.2   Total Bilirubin 0.3 - 1.2 mg/dL  0.7  0.9   Alkaline Phos 38 - 126 U/L  99  99   AST 15 - 41 U/L  16  16   ALT 0 - 44 U/L  20  17      Bone Marrow Biopsy 09/29/2022:    RADIOGRAPHIC STUDIES: I have personally reviewed the radiological images as listed and agreed with the findings in the report. DG Abd Portable 1V  Result Date: 01/28/2023 CLINICAL DATA:  No bowel movement for 3 days EXAM: PORTABLE ABDOMEN - 1 VIEW COMPARISON:  12/19/2022 FINDINGS: Mild air distension of transverse colon and few air-filled loops small bowel but no convincing obstructive pattern. No radiopaque calculi. No significant stool burden. Lytic lesions within the pelvis corresponding to history of myeloma. IMPRESSION: Nonspecific nonobstructive bowel gas pattern without significant stool burden. Lytic lesions corresponding to history of multiple myeloma Electronically Signed   By: Jasmine Pang M.D.   On: 01/28/2023 19:01   DG Pelvis 1-2 Views  Result Date: 01/26/2023 CLINICAL DATA:   Status post recent fall. EXAM: PELVIS - 1-2 VIEW COMPARISON:  None Available. FINDINGS: There is no evidence of pelvic fracture or diastasis. Moderate severity degenerative changes are seen involving both hips, in the form of joint space narrowing, acetabular sclerosis and lateral acetabular bony spurring. No pelvic bone lesions are seen. IMPRESSION: Moderate severity degenerative changes without an acute osseous abnormality. Electronically Signed   By: Aram Candela M.D.   On: 01/26/2023 19:12   DG Chest 2 View  Result Date: 01/26/2023 CLINICAL DATA:  Weakness, hypotension and recent falls. EXAM: CHEST - 2 VIEW COMPARISON:  December 27, 2022 FINDINGS: The heart size and mediastinal contours are within normal limits. There is marked severity calcification of the aortic arch. Both lungs are clear. Multilevel degenerative changes seen throughout the thoracic spine. IMPRESSION: No active cardiopulmonary disease. Electronically Signed   By: Aram Candela M.D.   On: 01/26/2023 19:09   CT Head Wo Contrast  Result Date: 01/26/2023 CLINICAL DATA:  Polytrauma, blunt.  History of multiple myeloma EXAM: CT HEAD WITHOUT CONTRAST CT CERVICAL SPINE WITHOUT CONTRAST TECHNIQUE: Multidetector CT imaging of the head and cervical spine was performed following the standard protocol without intravenous contrast. Multiplanar CT image reconstructions of the cervical spine were also generated. RADIATION DOSE REDUCTION: This exam was performed according to the departmental dose-optimization program which includes automated exposure control, adjustment of the mA and/or kV according to patient size and/or use of iterative reconstruction technique. COMPARISON:  12/04/2022 FINDINGS: CT HEAD FINDINGS Brain: No evidence of acute infarction, hemorrhage, hydrocephalus, extra-axial collection or mass lesion/mass effect. Vascular: Atherosclerotic calcifications involving the large vessels of the skull base. No unexpected hyperdense  vessel. Skull: Redemonstration of scattered lucent lesions throughout the bony calvarium. No acute calvarial fracture. Sinuses/Orbits: No acute finding. Other: Negative for scalp hematoma. CT CERVICAL SPINE FINDINGS Alignment: Facet joints are aligned without dislocation or traumatic listhesis. Dens and lateral masses are aligned. Skull base and vertebrae: No acute fracture is identified. Widespread lytic lesions throughout the visualized osseous structures compatible with known multiple myeloma. Soft tissues and spinal canal: No prevertebral fluid or swelling. No visible canal hematoma. Disc levels: Bulky bridging anterior endplate osteophytes spanning from C2 to C6. Relative preservation of the disc heights. No advanced facet arthropathy. Upper chest: Included lung apices are clear. Other: Bilateral carotid atherosclerosis. IMPRESSION: 1. No acute intracranial abnormality. 2. No acute fracture or subluxation of the cervical spine. 3. Widespread lytic lesions throughout the visualized osseous structures compatible with known multiple myeloma. Electronically Signed   By: Duanne Guess D.O.   On: 01/26/2023 18:53   CT Cervical Spine Wo Contrast  Result Date: 01/26/2023 CLINICAL DATA:  Polytrauma, blunt.  History of multiple myeloma EXAM: CT HEAD WITHOUT CONTRAST CT CERVICAL SPINE WITHOUT CONTRAST TECHNIQUE: Multidetector CT imaging of the head and cervical spine was performed following the standard protocol without intravenous contrast. Multiplanar CT image reconstructions of the cervical spine were also generated. RADIATION DOSE REDUCTION: This exam was performed according to the departmental dose-optimization program which includes automated exposure control, adjustment of the mA and/or kV according to patient size and/or use of iterative reconstruction technique. COMPARISON:  12/04/2022 FINDINGS: CT HEAD FINDINGS Brain: No evidence of acute infarction, hemorrhage, hydrocephalus, extra-axial collection or  mass lesion/mass effect. Vascular: Atherosclerotic calcifications involving the large vessels of the skull base. No unexpected hyperdense vessel. Skull: Redemonstration of scattered lucent lesions throughout the bony calvarium. No acute calvarial fracture. Sinuses/Orbits: No acute finding. Other: Negative for scalp hematoma. CT CERVICAL SPINE FINDINGS Alignment: Facet joints are aligned without dislocation or traumatic listhesis. Dens and lateral masses are aligned. Skull base and vertebrae: No acute fracture is identified. Widespread lytic lesions throughout the visualized osseous structures compatible with known multiple myeloma. Soft tissues and spinal canal: No prevertebral fluid or swelling. No visible canal hematoma. Disc levels: Bulky bridging anterior endplate osteophytes spanning from C2 to C6. Relative preservation of the disc heights. No advanced facet arthropathy. Upper chest: Included lung apices are clear. Other: Bilateral carotid atherosclerosis. IMPRESSION: 1. No acute intracranial abnormality. 2. No acute fracture or subluxation of the cervical spine. 3. Widespread lytic lesions throughout the visualized osseous structures compatible with known multiple myeloma. Electronically Signed   By: Duanne Guess D.O.   On: 01/26/2023 18:53    ASSESSMENT & PLAN:   72 year old legally blind gentleman with significant functional limitations and limited social support presented with who was newly diagnosed with multiple myeloma and of May 2024 and was set up for chemo counseling to  start daratumumab dexamethasone Velcade and Xgeva and orders were placed and the patient was to get a PET scan but was lost to follow-up.  He had also been referred to social work at that time to help address his barriers of care. He is now admitted with progressive multiple myeloma.  1. Multiple myeloma, IgG kappa, diagnosed May 2024-untreated since patient did not follow-up now admitted with progressive myeloma with severe  anemia and progressive renal failure.  CTs 12/04/2022-innumerable lytic lesions, large expansile lesion in the anterior right second rib, multiple pathologic rib fractures, expansile T4 (T3 on CT cervical spine) mass with soft tissue component extending into the central canal and left neural foramen, C7 transverse process fracture  Decadron daily x 4 starting 12/05/2022  Cycle 1 day 1 Velcade 12/07/2022  Decadron daily x 4 starting 12/12/2022  Cycle 1 day 8 Velcade 12/14/2022 2.  Acute/chronic renal failure 3.  Severe anemia 4.  Mild thrombocytopenia 5.  Diabetes 6.  Hypertension 7.  Glaucoma 8.  OSA  PLAN: -Discussed lab results from today, 02/06/2023, in detail with the patient. CBC shows patient is anemic with hemoglobin of 9.7 g/dL and hematocrit of 16.1%.  -Discussed Multiple myeloma panel results from 01/29/2023. Showed M-protein has improved from 3.2 to 0.1. Since M-protein has improved, we will continue with current treatment.  -Patient has been tolerating his treatment well without any new or severe toxicities.  -Patient missed his day 15 cycle 2 of his treatment last week due to hospitalization.  -Patient can proceed with cycle 2 day 22 of his treatment as planned without any dose modification during this visit. -no indication for blood transfusion today -proceed with Daratumumab as part of treatment  -Answered all of patient's and his wife's questions regarding treatment and other general questions regarding multiple myeloma.   -Discussed the option of nursing facility. Patient is going to talk with his wife and then decide. -Discussed the option of home-care nursing. Patient agrees with this plan.  -Discussed with the patient that we might change treatment to maintenance treatment if labs and multiple myeloma panel shows improvement.   -Had a discussion with the patient about patient's depression. Patient will tell us during our next visit if he needs medication for depression.  -Will  refer the patient for home-care RN-- to help address skin lesion.  FOLLOW-UP: Per integrated scheduling Referral to home [id number]  The total time spent in the appointment was 30 minutes* .  All of the patient's questions were answered with apparent satisfaction. The patient knows to call the clinic with any problems, questions or concerns.   Wyvonnia Lora MD MS AAHIVMS [facility] Hematology/Oncology Physician [facility] - [facility]  .*Total Encounter Time as defined by the Centers for Medicare and Medicaid Services includes, in addition to the face-to-face time of a patient visit (documented in the note above) non-face-to-face time: obtaining and reviewing outside history, ordering and reviewing medications, tests or procedures, care coordination (communications with other health care professionals or caregivers) and documentation in the medical record.   I,Param Shah,acting as a Neurosurgeon for Wyvonnia Lora, MD.,have documented all relevant documentation on the behalf of Wyvonnia Lora, MD,as directed by  Wyvonnia Lora, MD while in the presence of Wyvonnia Lora, MD.   .I have reviewed the above documentation for accuracy and completeness, and I agree with the above. Johney Maine MD

## 2023-02-06 NOTE — Patient Instructions (Signed)
[facility]  Discharge Instructions: Thank you for choosing [facility] to provide your oncology and hematology care.   If you have a lab appointment with the Cancer Center, please go directly to the Cancer Center and check in at the registration area.   Wear comfortable clothing and clothing appropriate for easy access to any Portacath or PICC line.   We strive to give you quality time with your provider. You may need to reschedule your appointment if you arrive late (15 or more minutes).  Arriving late affects you and other patients whose appointments are after yours.  Also, if you miss three or more appointments without notifying the office, you may be dismissed from the clinic at the provider's discretion.      For prescription refill requests, have your pharmacy contact our office and allow 72 hours for refills to be completed.    Today you received the following chemotherapy and/or immunotherapy agents: Darazalex Faspro.       To help prevent nausea and vomiting after your treatment, we encourage you to take your nausea medication as directed.  BELOW ARE SYMPTOMS THAT SHOULD BE REPORTED IMMEDIATELY: *FEVER GREATER THAN 100.4 F (38 C) OR HIGHER *CHILLS OR SWEATING *NAUSEA AND VOMITING THAT IS NOT CONTROLLED WITH YOUR NAUSEA MEDICATION *UNUSUAL SHORTNESS OF BREATH *UNUSUAL BRUISING OR BLEEDING *URINARY PROBLEMS (pain or burning when urinating, or frequent urination) *BOWEL PROBLEMS (unusual diarrhea, constipation, pain near the anus) TENDERNESS IN MOUTH AND THROAT WITH OR WITHOUT PRESENCE OF ULCERS (sore throat, sores in mouth, or a toothache) UNUSUAL RASH, SWELLING OR PAIN  UNUSUAL VAGINAL DISCHARGE OR ITCHING   Items with * indicate a potential emergency and should be followed up as soon as possible or go to the Emergency Department if any problems should occur.  Please show the CHEMOTHERAPY ALERT CARD or IMMUNOTHERAPY ALERT CARD  at check-in to the Emergency Department and triage nurse.  Should you have questions after your visit or need to cancel or reschedule your appointment, please contact [facility]  Dept: [phone]  and follow the prompts.  Office hours are 8:00 a.m. to 4:30 p.m. Monday - Friday. Please note that voicemails left after 4:00 p.m. may not be returned until the following business day.  We are closed weekends and major holidays. You have access to a nurse at all times for urgent questions. Please call the main number to the clinic Dept: [phone] and follow the prompts.   For any non-urgent questions, you may also contact your provider using MyChart. We now offer e-Visits for anyone 77 and older to request care online for non-urgent symptoms. For details visit [email or web address].   Also download the MyChart app! Go to the app store, search "MyChart", open the app, select [facility], and log in with your MyChart username and password.

## 2023-02-07 LAB — GLUCOSE, CAPILLARY
Glucose-Capillary: 93 mg/dL (ref 70–99)
Glucose-Capillary: 96 mg/dL (ref 70–99)
Glucose-Capillary: 213 mg/dL — ABNORMAL HIGH (ref 70–99)

## 2023-02-09 ENCOUNTER — Other Ambulatory Visit: Payer: Self-pay

## 2023-02-12 ENCOUNTER — Encounter: Payer: Self-pay | Admitting: Hematology

## 2023-02-13 ENCOUNTER — Inpatient Hospital Stay: Payer: Medicare PPO

## 2023-02-13 ENCOUNTER — Other Ambulatory Visit: Payer: Self-pay

## 2023-02-13 ENCOUNTER — Inpatient Hospital Stay (HOSPITAL_BASED_OUTPATIENT_CLINIC_OR_DEPARTMENT_OTHER): Payer: Medicare PPO | Admitting: Hematology

## 2023-02-13 VITALS — HR 99

## 2023-02-13 DIAGNOSIS — C9000 Multiple myeloma not having achieved remission: Secondary | ICD-10-CM | POA: Diagnosis not present

## 2023-02-13 DIAGNOSIS — Z5112 Encounter for antineoplastic immunotherapy: Secondary | ICD-10-CM | POA: Diagnosis not present

## 2023-02-13 DIAGNOSIS — Z7189 Other specified counseling: Secondary | ICD-10-CM | POA: Diagnosis not present

## 2023-02-13 LAB — CMP (CANCER CENTER ONLY)
ALT: 10 U/L (ref 0–44)
AST: 11 U/L — ABNORMAL LOW (ref 15–41)
Albumin: 3.3 g/dL — ABNORMAL LOW (ref 3.5–5.0)
Alkaline Phosphatase: 127 U/L — ABNORMAL HIGH (ref 38–126)
Anion gap: 8 (ref 5–15)
BUN: 9 mg/dL (ref 8–23)
CO2: 27 mmol/L (ref 22–32)
Calcium: 8.6 mg/dL — ABNORMAL LOW (ref 8.9–10.3)
Chloride: 106 mmol/L (ref 98–111)
Creatinine: 1.01 mg/dL (ref 0.61–1.24)
GFR, Estimated: >60 mL/min (ref >60)
Glucose, Bld: 163 mg/dL — ABNORMAL HIGH (ref 70–99)
Potassium: 3.3 mmol/L — ABNORMAL LOW (ref 3.5–5.1)
Sodium: 141 mmol/L (ref 135–145)
Total Bilirubin: 1 mg/dL (ref 0.3–1.2)
Total Protein: 5.7 g/dL — ABNORMAL LOW (ref 6.5–8.1)

## 2023-02-13 LAB — CBC WITH DIFFERENTIAL (CANCER CENTER ONLY)
Abs Immature Granulocytes: 0.03 10*3/uL (ref 0.00–0.07)
Basophils Absolute: 0 10*3/uL (ref 0.0–0.1)
Basophils Relative: 1 %
Eosinophils Absolute: 0 10*3/uL (ref 0.0–0.5)
Eosinophils Relative: 0 %
HCT: 29.9 % — ABNORMAL LOW (ref 39.0–52.0)
Hemoglobin: 9.9 g/dL — ABNORMAL LOW (ref 13.0–17.0)
Immature Granulocytes: 1 %
Lymphocytes Relative: 23 %
Lymphs Abs: 1.5 10*3/uL (ref 0.7–4.0)
MCH: 30.9 pg (ref 26.0–34.0)
MCHC: 33.1 g/dL (ref 30.0–36.0)
MCV: 93.4 fL (ref 80.0–100.0)
Monocytes Absolute: 0.8 10*3/uL (ref 0.1–1.0)
Monocytes Relative: 12 %
Neutro Abs: 4.2 10*3/uL (ref 1.7–7.7)
Neutrophils Relative %: 63 %
Platelet Count: 273 10*3/uL (ref 150–400)
RBC: 3.2 MIL/uL — ABNORMAL LOW (ref 4.22–5.81)
RDW: 17 % — ABNORMAL HIGH (ref 11.5–15.5)
WBC Count: 6.6 10*3/uL (ref 4.0–10.5)
nRBC: 0 % (ref 0.0–0.2)

## 2023-02-13 MED ORDER — DEXAMETHASONE 4 MG PO TABS
20.0000 mg | ORAL_TABLET | Freq: Once | ORAL | Status: AC
  Administered 2023-02-13: 20 mg via ORAL
  Filled 2023-02-13: qty 5

## 2023-02-13 MED ORDER — DARATUMUMAB-HYALURONIDASE-FIHJ 1800-30000 MG-UT/15ML ~~LOC~~ SOLN
1800.0000 mg | Freq: Once | SUBCUTANEOUS | Status: AC
  Administered 2023-02-13: 1800 mg via SUBCUTANEOUS
  Filled 2023-02-13: qty 15

## 2023-02-13 MED ORDER — DIPHENHYDRAMINE HCL 25 MG PO CAPS
50.0000 mg | ORAL_CAPSULE | Freq: Once | ORAL | Status: AC
  Administered 2023-02-13: 50 mg via ORAL
  Filled 2023-02-13: qty 2

## 2023-02-13 MED ORDER — ACETAMINOPHEN 325 MG PO TABS
650.0000 mg | ORAL_TABLET | Freq: Once | ORAL | Status: AC
  Administered 2023-02-13: 650 mg via ORAL
  Filled 2023-02-13: qty 2

## 2023-02-13 MED ORDER — FAMOTIDINE 20 MG PO TABS
20.0000 mg | ORAL_TABLET | Freq: Once | ORAL | Status: AC
  Administered 2023-02-13: 20 mg via ORAL
  Filled 2023-02-13: qty 1

## 2023-02-13 MED ORDER — BORTEZOMIB CHEMO SQ INJECTION 3.5 MG (2.5MG/ML)
1.5000 mg/m2 | Freq: Once | INTRAMUSCULAR | Status: AC
  Administered 2023-02-13: 2.75 mg via SUBCUTANEOUS
  Filled 2023-02-13: qty 1.1

## 2023-02-13 NOTE — Progress Notes (Signed)
HEMATOLOGY/ONCOLOGY CLINIC NOTE  Date of Service: 02/13/2023  Patient Care Team: Tracey Harries, MD as PCP - General (Family Medicine)  CHIEF COMPLAINTS/PURPOSE OF CONSULTATION:  Evaluation and management of multiple myeloma.  HISTORY OF PRESENTING ILLNESS:  Scott Bass is a wonderful 72 y.o. male who has been referred to [place] by Tracey Harries, MD for evaluation and management of multiple myeloma.  Patient was in ED for symptomatic anemia on 09/08/2022. Patient presented to the ED and was hospitalized on 09/25/2022 for acute renal failure.   Today, he presents in a wheelchair and is accompanied by his wife. He reports that he is intermittently nauseous. He does not take anything to manage nausea. He denies any acid reflux symptoms.  He does have mild abdominal pain. He has had one bowel movement this week and does endorse a cycle of constipation and diarrhea. He reports recent poor p.o intake due to loss of appetite over the last month. He reports a weight loss of 5-6 pounds. He denies any leg swelling, radiation exposure, or neuropathy.  He reports that receiving blood transfusions during his hospitalization did improve energy levels. He was given Oxycodone at the hospital which he has run out of. He currently uses Tylenol for pain management.   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 Patient's wife is also blind. He reports that he is able to manage daily activities with his wife at home.   Patient suffered a fall from a chair after it broke. He reports worsened fatigue which began one month ago. He denies any syncope or lightheadedness/dizziness.  He complains of chest wall pain as well as left hip pain in the groin and laterally. This pain has improved.   His DM2 is fairly well-controlled. He reports that he needs a Metformin refill. He has self-discontinued Jardiance 1-2 months ago as he thought that it would affect blood sugars levels. His blood sugars at home have been  fairly stable with its highest level at 145.   His wife reports that patient has frequent urination with a burning sensation. Patient's wife reports that his confusion has improved by 80%. Patient reports that he is occasionally confused. She reports that patient may need assistance with bedside commode. Patient reports that he hospital has initiated home care services. Patient reports that he does not have a nephrologist.  INTERVAL HISTORY:  Scott Bass is a 72 y.o. male here for continued evaluation and management of multiple myeloma. He is here for his cycle 3 day 1 of treatment.   Patient was seen by me as in-patient on 01/29/2023. He was recently discharged from a rehab facility, but then he had a recent fall which was the reason he was admitted. He was found to be hypokalemic thrombocytopenic and had an acute kidney injury from the fall. He was admitted from 01/26/2023 to 02/01/2023.   He was seen by me in clinic on 02/06/2023 and reported improving right hip pain form his fall. Patient reported a wound in his lower and upper back. His wife reported that patient was mildly depressed due to recent hospitalizations.   Today, he is accompanied by his wife. He complains of nausea sometimes when consuming certain foods. Patient reports that he continues to eat as much as he can "hold down". His food intake is okay at this time thought his water intake is limited.   He generally regularly takes Mydodrine but notes that he may have forgotten to take it today. Patient regularly takes  Flomax in the mornings. He takes synthroid regularly. Patient does not take acyclovir and is no longer on Crestor.   He denies any urinary issues or fever. Patient complains of loss of control of bowel movements. Patient continues to take magnesium oxide and sodium bicarbonate   He reports that the ulcer in his back is healing. Patient has regular visit with home care nurse for physical therapy for knee issues.  Patient denies any recent falls and uses a walker regularly.   MEDICAL HISTORY:  Past Medical History:  Diagnosis Date   Arthritis    "lower back" (12/18/2017)   Better eye: moderate vision impairment; lesser eye: blind    GERD (gastroesophageal reflux disease)    Glaucoma, both eyes    High cholesterol    Hypertension    Hypothyroidism    Irregular heartbeat    Legally blind    "both eyes; can see some out of left eye"   OSA (obstructive sleep apnea)    Thyroid disease    Type II diabetes mellitus (HCC)    Vitamin B12 deficiency     SURGICAL HISTORY: Past Surgical History:  Procedure Laterality Date   LEFT HEART CATH AND CORONARY ANGIOGRAPHY N/A 12/18/2017   Procedure: LEFT HEART CATH AND CORONARY ANGIOGRAPHY;  Surgeon: Elder Negus, MD;  Location: [facility];  Service: Cardiovascular;  Laterality: N/A;   NOSE SURGERY     "was crooked; they straightened it out" (12/18/2017)    SOCIAL HISTORY: Social History   Socioeconomic History   Marital status: Married    Spouse name: Not on file   Number of children: 0   Years of education: Not on file   Highest education level: Not on file  Occupational History   Occupation: retired  Tobacco Use   Smoking status: Never   Smokeless tobacco: Never  Vaping Use   Vaping status: Never Used  Substance and Sexual Activity   Alcohol use: Not Currently    Comment: occ   Drug use: Never   Sexual activity: Not Currently  Other Topics Concern   Not on file  Social History Narrative   Not on file   Social Determinants of Health   Financial Resource Strain: Low Risk  (10/10/2022)   Received from [facility], [facility]   Overall Financial Resource Strain (CARDIA)    Difficulty of Paying Living Expenses: Not very hard  Food Insecurity: No Food Insecurity (01/28/2023)   Hunger Vital Sign    Worried About Running Out of Food in the Last Year: Never true    Ran Out of Food in the Last Year: Never true  Recent  Concern: Food Insecurity - Food Insecurity Present (12/06/2022)   Hunger Vital Sign    Worried About Running Out of Food in the Last Year: Often true    Ran Out of Food in the Last Year: Often true  Transportation Needs: No Transportation Needs (01/28/2023)   PRAPARE - Administrator, Civil Service (Medical): No    Lack of Transportation (Non-Medical): No  Physical Activity: Insufficiently Active (12/23/2020)   Received [facility]   Exercise Vital Sign    Days of Exercise per Week: 2 days    Minutes of Exercise per Session: 10 min  Stress: No Stress Concern Present (12/23/2020)   Received from [facility], [facility] Occupational [facility] - Occupational Stress Questionnaire    Feeling of Stress : Not at all  Social Connections: Unknown (09/16/2021)   Received from [facility], [facility]   Social Network    Social Network: Not on file  Intimate Partner Violence: Not At Risk (01/28/2023)   Humiliation, Afraid, Rape, and Kick questionnaire    Fear of Current or Ex-Partner: No    Emotionally Abused: No    Physically Abused: No    Sexually Abused: No    FAMILY HISTORY: Family History  Problem Relation Age of Onset   Diabetes Mellitus II Mother 38   Diabetes Mellitus II Sister    Breast cancer Sister    Heart failure Sister 23    ALLERGIES:  is allergic to hctz [hydrochlorothiazide].  MEDICATIONS:  Current Outpatient Medications  Medication Sig Dispense Refill   acetaminophen (TYLENOL) 500 MG tablet Take 2 tablets (1,000 mg total) by mouth every 8 (eight) hours as needed for moderate pain.     acyclovir (ZOVIRAX) 400 MG tablet Take 1 tablet (400 mg total) by mouth 2 (two) times daily. 60 tablet 5   brimonidine (ALPHAGAN) 0.2 % ophthalmic solution Place 1 drop into both eyes 3 (three) times daily.   0   ferrous sulfate 325 (65 FE) MG tablet Take 325 mg by mouth every other day.     folic acid (FOLVITE) 1 MG tablet  Take 1 tablet (1 mg total) by mouth daily.     insulin aspart (NOVOLOG) 100 UNIT/ML injection Inject 0-6 Units into the skin 3 (three) times daily with meals. CBG < 70: treat low blood sugar CBG 70 - 120: 0 units CBG 121 - 150: 0 units CBG 151 - 200: 1 unit CBG 201-250: 2 units CBG 251-300: 3 units CBG 301-350: 4 units CBG 351-400: 5 units CBG > 400: Give 6 units and call MD (Patient not taking: Reported on 02/06/2023)     latanoprost (XALATAN) 0.005 % ophthalmic solution Place 1 drop into both eyes every evening.     levothyroxine (SYNTHROID, LEVOTHROID) 150 MCG tablet Take 150 mcg by mouth daily.  0   magnesium oxide (MAG-OX) 400 (240 Mg) MG tablet Take 400 mg by mouth daily. (Patient not taking: Reported on 02/06/2023)     Magnesium Oxide 400 MG CAPS Take 400 mg by mouth daily.     midodrine (PROAMATINE) 2.5 MG tablet Take 1 tablet (2.5 mg total) by mouth 2 (two) times daily with a meal. 60 tablet 1   pantoprazole (PROTONIX) 40 MG tablet Take 1 tablet (40 mg total) by mouth daily.     polyethylene glycol (MIRALAX / GLYCOLAX) 17 g packet Take 17 g by mouth daily. (Patient not taking: Reported on 02/06/2023)     rosuvastatin (CRESTOR) 10 MG tablet Take 10 mg by mouth every evening.     sodium bicarbonate 650 MG tablet Take 2 tablets (1,300 mg total) by mouth 3 (three) times daily.     tamsulosin (FLOMAX) 0.4 MG CAPS capsule Take 0.4 mg by mouth daily.     No current facility-administered medications for this visit.    REVIEW OF SYSTEMS:    10 Point review of Systems was done is negative except as noted above.   PHYSICAL EXAMINATION: ECOG PERFORMANCE STATUS: 3 - Symptomatic, >50% confined to bed  . Vitals:   02/13/23 1415  BP: 92/68  Pulse: (!) 105  Resp: 18  Temp: 97.7 F (36.5 C)  SpO2: 100%    Filed Weights   02/13/23 1415  Weight: 137 lb 14.4 oz (62.6 kg)   .Body mass index is  22.26 kg/m.   GENERAL:alert, in no acute distress and comfortable SKIN: no acute rashes,  no significant lesions EYES: conjunctiva are pink and non-injected, sclera anicteric OROPHARYNX: MMM, no exudates, no oropharyngeal erythema or ulceration NECK: supple, no JVD LYMPH:  no palpable lymphadenopathy in the cervical, axillary or inguinal regions LUNGS: clear to auscultation b/l with normal respiratory effort HEART: regular rate & rhythm ABDOMEN:  normoactive bowel sounds , non tender, not distended. Extremity: no pedal edema PSYCH: alert & oriented x 3 with fluent speech NEURO: no focal motor/sensory deficits   LABORATORY DATA:  I have reviewed the data as listed .    Latest Ref Rng & Units 02/13/2023    1:29 PM 02/06/2023   12:52 PM 02/01/2023    6:58 AM  CBC  WBC 4.0 - 10.5 K/uL 6.6  5.7    Hemoglobin 13.0 - 17.0 g/dL 9.9  9.7  9.2   Hematocrit 39.0 - 52.0 % 29.9  29.8  29.3   Platelets 150 - 400 K/uL 273  317     .    Latest Ref Rng & Units 02/13/2023    1:29 PM 02/06/2023   12:52 PM 02/01/2023    4:40 AM  CMP  Glucose 70 - 99 mg/dL 478  94  95   BUN 8 - 23 mg/dL 9  9  10    Creatinine 0.61 - 1.24 mg/dL 2.95  6.21  3.08   Sodium 135 - 145 mmol/L 141  144  139   Potassium 3.5 - 5.1 mmol/L 3.3  3.3  4.1   Chloride 98 - 111 mmol/L 106  112  108   CO2 22 - 32 mmol/L 27  22  24    Calcium 8.9 - 10.3 mg/dL 8.6  8.4  7.5   Total Protein 6.5 - 8.1 g/dL 5.7  5.6    Total Bilirubin 0.3 - 1.2 mg/dL 1.0  1.0    Alkaline Phos 38 - 126 U/L 127  118    AST 15 - 41 U/L 11  10    ALT 0 - 44 U/L 10  8       Bone Marrow Biopsy 09/29/2022:    RADIOGRAPHIC STUDIES: I have personally reviewed the radiological images as listed and agreed with the findings in the report. DG Abd Portable 1V  Result Date: 01/28/2023 CLINICAL DATA:  No bowel movement for 3 days EXAM: PORTABLE ABDOMEN - 1 VIEW COMPARISON:  12/19/2022 FINDINGS: Mild air distension of transverse colon and few air-filled loops small bowel but no convincing obstructive pattern. No radiopaque calculi. No significant  stool burden. Lytic lesions within the pelvis corresponding to history of myeloma. IMPRESSION: Nonspecific nonobstructive bowel gas pattern without significant stool burden. Lytic lesions corresponding to history of multiple myeloma Electronically Signed   By: Jasmine Pang M.D.   On: 01/28/2023 19:01   DG Pelvis 1-2 Views  Result Date: 01/26/2023 CLINICAL DATA:  Status post recent fall. EXAM: PELVIS - 1-2 VIEW COMPARISON:  None Available. FINDINGS: There is no evidence of pelvic fracture or diastasis. Moderate severity degenerative changes are seen involving both hips, in the form of joint space narrowing, acetabular sclerosis and lateral acetabular bony spurring. No pelvic bone lesions are seen. IMPRESSION: Moderate severity degenerative changes without an acute osseous abnormality. Electronically Signed   By: Aram Candela M.D.   On: 01/26/2023 19:12   DG Chest 2 View  Result Date: 01/26/2023 CLINICAL DATA:  Weakness, hypotension and recent falls. EXAM: CHEST - 2 VIEW  COMPARISON:  December 27, 2022 FINDINGS: The heart size and mediastinal contours are within normal limits. There is marked severity calcification of the aortic arch. Both lungs are clear. Multilevel degenerative changes seen throughout the thoracic spine. IMPRESSION: No active cardiopulmonary disease. Electronically Signed   By: Aram Candela M.D.   On: 01/26/2023 19:09   CT Head Wo Contrast  Result Date: 01/26/2023 CLINICAL DATA:  Polytrauma, blunt.  History of multiple myeloma EXAM: CT HEAD WITHOUT CONTRAST CT CERVICAL SPINE WITHOUT CONTRAST TECHNIQUE: Multidetector CT imaging of the head and cervical spine was performed following the standard protocol without intravenous contrast. Multiplanar CT image reconstructions of the cervical spine were also generated. RADIATION DOSE REDUCTION: This exam was performed according to the departmental dose-optimization program which includes automated exposure control, adjustment of the mA  and/or kV according to patient size and/or use of iterative reconstruction technique. COMPARISON:  12/04/2022 FINDINGS: CT HEAD FINDINGS Brain: No evidence of acute infarction, hemorrhage, hydrocephalus, extra-axial collection or mass lesion/mass effect. Vascular: Atherosclerotic calcifications involving the large vessels of the skull base. No unexpected hyperdense vessel. Skull: Redemonstration of scattered lucent lesions throughout the bony calvarium. No acute calvarial fracture. Sinuses/Orbits: No acute finding. Other: Negative for scalp hematoma. CT CERVICAL SPINE FINDINGS Alignment: Facet joints are aligned without dislocation or traumatic listhesis. Dens and lateral masses are aligned. Skull base and vertebrae: No acute fracture is identified. Widespread lytic lesions throughout the visualized osseous structures compatible with known multiple myeloma. Soft tissues and spinal canal: No prevertebral fluid or swelling. No visible canal hematoma. Disc levels: Bulky bridging anterior endplate osteophytes spanning from C2 to C6. Relative preservation of the disc heights. No advanced facet arthropathy. Upper chest: Included lung apices are clear. Other: Bilateral carotid atherosclerosis. IMPRESSION: 1. No acute intracranial abnormality. 2. No acute fracture or subluxation of the cervical spine. 3. Widespread lytic lesions throughout the visualized osseous structures compatible with known multiple myeloma. Electronically Signed   By: Duanne Guess D.O.   On: 01/26/2023 18:53   CT Cervical Spine Wo Contrast  Result Date: 01/26/2023 CLINICAL DATA:  Polytrauma, blunt.  History of multiple myeloma EXAM: CT HEAD WITHOUT CONTRAST CT CERVICAL SPINE WITHOUT CONTRAST TECHNIQUE: Multidetector CT imaging of the head and cervical spine was performed following the standard protocol without intravenous contrast. Multiplanar CT image reconstructions of the cervical spine were also generated. RADIATION DOSE REDUCTION: This  exam was performed according to the departmental dose-optimization program which includes automated exposure control, adjustment of the mA and/or kV according to patient size and/or use of iterative reconstruction technique. COMPARISON:  12/04/2022 FINDINGS: CT HEAD FINDINGS Brain: No evidence of acute infarction, hemorrhage, hydrocephalus, extra-axial collection or mass lesion/mass effect. Vascular: Atherosclerotic calcifications involving the large vessels of the skull base. No unexpected hyperdense vessel. Skull: Redemonstration of scattered lucent lesions throughout the bony calvarium. No acute calvarial fracture. Sinuses/Orbits: No acute finding. Other: Negative for scalp hematoma. CT CERVICAL SPINE FINDINGS Alignment: Facet joints are aligned without dislocation or traumatic listhesis. Dens and lateral masses are aligned. Skull base and vertebrae: No acute fracture is identified. Widespread lytic lesions throughout the visualized osseous structures compatible with known multiple myeloma. Soft tissues and spinal canal: No prevertebral fluid or swelling. No visible canal hematoma. Disc levels: Bulky bridging anterior endplate osteophytes spanning from C2 to C6. Relative preservation of the disc heights. No advanced facet arthropathy. Upper chest: Included lung apices are clear. Other: Bilateral carotid atherosclerosis. IMPRESSION: 1. No acute intracranial abnormality. 2. No acute fracture or subluxation of  the cervical spine. 3. Widespread lytic lesions throughout the visualized osseous structures compatible with known multiple myeloma. Electronically Signed   By: Duanne Guess D.O.   On: 01/26/2023 18:53    ASSESSMENT & PLAN:   72 year old legally blind gentleman with significant functional limitations and limited social support presented with who was newly diagnosed with multiple myeloma and of May 2024 and was set up for chemo counseling to start daratumumab dexamethasone Velcade and Xgeva and orders  were placed and the patient was to get a PET scan but was lost to follow-up.  He had also been referred to social work at that time to help address his barriers of care. He is now admitted with progressive multiple myeloma.  1. Multiple myeloma, IgG kappa, diagnosed May 2024-untreated since patient did not follow-up now admitted with progressive myeloma with severe anemia and progressive renal failure.  CTs 12/04/2022-innumerable lytic lesions, large expansile lesion in the anterior right second rib, multiple pathologic rib fractures, expansile T4 (T3 on CT cervical spine) mass with soft tissue component extending into the central canal and left neural foramen, C7 transverse process fracture  Decadron daily x 4 starting 12/05/2022  Cycle 1 day 1 Velcade 12/07/2022  Decadron daily x 4 starting 12/12/2022  Cycle 1 day 8 Velcade 12/14/2022 2.  Acute/chronic renal failure 3.  Severe anemia 4.  Mild thrombocytopenia 5.  Diabetes 6.  Hypertension 7.  Glaucoma 8.  OSA  PLAN:  -Discussed lab results on 02/13/23 in detail with patient. CBC showed WBC of 6.6K, hemoglobin of 9.9, and platelets of 273K. -continue to optimize food and water intake to to support strength -educated patient that food and water intake would play a role in his blood pressure reading -educated patient that Flomax can cause lightheadedness, which patient denies at this time -educated patient that Magnesium oxide and sodium bicarbonate may cause some diarrhea -His kidney numbers have improved. Will hold Magnesium oxide and sodium bicarbonate to control bowel habits -would recommend home care nurses for medication management support -continue Dara/Velcade/Dex  FOLLOW-UP: Per integrated scheduling  The total time spent in the appointment was 20 minutes* .  All of the patient's questions were answered with apparent satisfaction. The patient knows to call the clinic with any problems, questions or concerns.   Wyvonnia Lora MD MS  AAHIVMS [name] Hematology/Oncology Physician [facility]  .*Total Encounter Time as defined by the Centers for Medicare and Medicaid Services includes, in addition to the face-to-face time of a patient visit (documented in the note above) non-face-to-face time: obtaining and reviewing outside history, ordering and reviewing medications, tests or procedures, care coordination (communications with other health care professionals or caregivers) and documentation in the medical record.    I,Mitra Faeizi,acting as a Neurosurgeon for Wyvonnia Lora, MD.,have documented all relevant documentation on the behalf of Wyvonnia Lora, MD,as directed by  Wyvonnia Lora, MD while in the presence of Wyvonnia Lora, MD.  .I have reviewed the above documentation for accuracy and completeness, and I agree with the above. Johney Maine MD

## 2023-02-20 ENCOUNTER — Inpatient Hospital Stay: Payer: Medicare PPO

## 2023-02-20 ENCOUNTER — Encounter: Payer: Self-pay | Admitting: Hematology

## 2023-02-20 VITALS — BP 114/76 | HR 79 | Temp 97.8°F | Resp 18

## 2023-02-20 DIAGNOSIS — Z7189 Other specified counseling: Secondary | ICD-10-CM

## 2023-02-20 DIAGNOSIS — C9000 Multiple myeloma not having achieved remission: Secondary | ICD-10-CM

## 2023-02-20 DIAGNOSIS — Z5112 Encounter for antineoplastic immunotherapy: Secondary | ICD-10-CM | POA: Diagnosis not present

## 2023-02-20 LAB — CBC WITH DIFFERENTIAL (CANCER CENTER ONLY)
Abs Immature Granulocytes: 0.03 10*3/uL (ref 0.00–0.07)
Basophils Absolute: 0 10*3/uL (ref 0.0–0.1)
Basophils Relative: 0 %
Eosinophils Absolute: 0 10*3/uL (ref 0.0–0.5)
Eosinophils Relative: 0 %
HCT: 31.4 % — ABNORMAL LOW (ref 39.0–52.0)
Hemoglobin: 10.3 g/dL — ABNORMAL LOW (ref 13.0–17.0)
Immature Granulocytes: 0 %
Lymphocytes Relative: 17 %
Lymphs Abs: 1.7 10*3/uL (ref 0.7–4.0)
MCH: 30.7 pg (ref 26.0–34.0)
MCHC: 32.8 g/dL (ref 30.0–36.0)
MCV: 93.5 fL (ref 80.0–100.0)
Monocytes Absolute: 0.9 10*3/uL (ref 0.1–1.0)
Monocytes Relative: 9 %
Neutro Abs: 7 10*3/uL (ref 1.7–7.7)
Neutrophils Relative %: 74 %
Platelet Count: 246 10*3/uL (ref 150–400)
RBC: 3.36 MIL/uL — ABNORMAL LOW (ref 4.22–5.81)
RDW: 17.1 % — ABNORMAL HIGH (ref 11.5–15.5)
WBC Count: 9.6 10*3/uL (ref 4.0–10.5)
nRBC: 0 % (ref 0.0–0.2)

## 2023-02-20 LAB — CMP (CANCER CENTER ONLY)
ALT: 9 U/L (ref 0–44)
AST: 13 U/L — ABNORMAL LOW (ref 15–41)
Albumin: 3.5 g/dL (ref 3.5–5.0)
Alkaline Phosphatase: 129 U/L — ABNORMAL HIGH (ref 38–126)
Anion gap: 13 (ref 5–15)
BUN: 11 mg/dL (ref 8–23)
CO2: 23 mmol/L (ref 22–32)
Calcium: 8.7 mg/dL — ABNORMAL LOW (ref 8.9–10.3)
Chloride: 103 mmol/L (ref 98–111)
Creatinine: 0.94 mg/dL (ref 0.61–1.24)
GFR, Estimated: >60 mL/min (ref >60)
Glucose, Bld: 90 mg/dL (ref 70–99)
Potassium: 3.7 mmol/L (ref 3.5–5.1)
Sodium: 139 mmol/L (ref 135–145)
Total Bilirubin: 0.9 mg/dL (ref 0.3–1.2)
Total Protein: 5.8 g/dL — ABNORMAL LOW (ref 6.5–8.1)

## 2023-02-20 MED ORDER — DEXAMETHASONE 4 MG PO TABS
20.0000 mg | ORAL_TABLET | Freq: Once | ORAL | Status: AC
  Administered 2023-02-20: 20 mg via ORAL
  Filled 2023-02-20: qty 5

## 2023-02-20 MED ORDER — FAMOTIDINE 20 MG PO TABS
20.0000 mg | ORAL_TABLET | Freq: Once | ORAL | Status: AC
  Administered 2023-02-20: 20 mg via ORAL
  Filled 2023-02-20: qty 1

## 2023-02-20 MED ORDER — BORTEZOMIB CHEMO SQ INJECTION 3.5 MG (2.5MG/ML)
1.5000 mg/m2 | Freq: Once | INTRAMUSCULAR | Status: AC
  Administered 2023-02-20: 2.75 mg via SUBCUTANEOUS
  Filled 2023-02-20: qty 1.1

## 2023-02-20 NOTE — Patient Instructions (Signed)
[facility]  Discharge Instructions: Thank you for choosing [facility] to provide your oncology and hematology care.   If you have a lab appointment with the Cancer Center, please go directly to the Cancer Center and check in at the registration area.   Wear comfortable clothing and clothing appropriate for easy access to any Portacath or PICC line.   We strive to give you quality time with your provider. You may need to reschedule your appointment if you arrive late (15 or more minutes).  Arriving late affects you and other patients whose appointments are after yours.  Also, if you miss three or more appointments without notifying the office, you may be dismissed from the clinic at the provider's discretion.      For prescription refill requests, have your pharmacy contact our office and allow 72 hours for refills to be completed.    Today you received the following chemotherapy and/or immunotherapy agents: Velcade        To help prevent nausea and vomiting after your treatment, we encourage you to take your nausea medication as directed.  BELOW ARE SYMPTOMS THAT SHOULD BE REPORTED IMMEDIATELY: *FEVER GREATER THAN 100.4 F (38 C) OR HIGHER *CHILLS OR SWEATING *NAUSEA AND VOMITING THAT IS NOT CONTROLLED WITH YOUR NAUSEA MEDICATION *UNUSUAL SHORTNESS OF BREATH *UNUSUAL BRUISING OR BLEEDING *URINARY PROBLEMS (pain or burning when urinating, or frequent urination) *BOWEL PROBLEMS (unusual diarrhea, constipation, pain near the anus) TENDERNESS IN MOUTH AND THROAT WITH OR WITHOUT PRESENCE OF ULCERS (sore throat, sores in mouth, or a toothache) UNUSUAL RASH, SWELLING OR PAIN  UNUSUAL VAGINAL DISCHARGE OR ITCHING   Items with * indicate a potential emergency and should be followed up as soon as possible or go to the Emergency Department if any problems should occur.  Please show the CHEMOTHERAPY ALERT CARD or IMMUNOTHERAPY ALERT CARD at  check-in to the Emergency Department and triage nurse.  Should you have questions after your visit or need to cancel or reschedule your appointment, please contact [facility]  Dept: [phone]  and follow the prompts.  Office hours are 8:00 a.m. to 4:30 p.m. Monday - Friday. Please note that voicemails left after 4:00 p.m. may not be returned until the following business day.  We are closed weekends and major holidays. You have access to a nurse at all times for urgent questions. Please call the main number to the clinic Dept: [phone] and follow the prompts.   For any non-urgent questions, you may also contact your provider using MyChart. We now offer e-Visits for anyone 18 and older to request care online for non-urgent symptoms. For details visit [email or web address].   Also download the MyChart app! Go to the app store, search "MyChart", open the app, select [facility], and log in with your MyChart username and password.  

## 2023-02-27 ENCOUNTER — Other Ambulatory Visit: Payer: Self-pay

## 2023-02-27 ENCOUNTER — Encounter (HOSPITAL_COMMUNITY): Payer: Self-pay

## 2023-02-27 ENCOUNTER — Encounter: Payer: Self-pay | Admitting: Hematology

## 2023-02-27 ENCOUNTER — Inpatient Hospital Stay: Payer: Medicare PPO | Admitting: Hematology

## 2023-02-27 ENCOUNTER — Inpatient Hospital Stay: Payer: Medicare PPO

## 2023-02-27 ENCOUNTER — Emergency Department (HOSPITAL_COMMUNITY)
Admission: EM | Admit: 2023-02-27 | Discharge: 2023-02-27 | Disposition: A | Discharge status: Home / Self Care | Payer: Medicare PPO | Attending: Emergency Medicine | Admitting: Emergency Medicine

## 2023-02-27 VITALS — BP 93/63 | HR 117 | Resp 18

## 2023-02-27 DIAGNOSIS — Z7189 Other specified counseling: Secondary | ICD-10-CM

## 2023-02-27 DIAGNOSIS — C9000 Multiple myeloma not having achieved remission: Secondary | ICD-10-CM

## 2023-02-27 DIAGNOSIS — E876 Hypokalemia: Secondary | ICD-10-CM

## 2023-02-27 LAB — CBC WITH DIFFERENTIAL (CANCER CENTER ONLY)
Abs Immature Granulocytes: 0.03 10*3/uL (ref 0.00–0.07)
Basophils Absolute: 0 10*3/uL (ref 0.0–0.1)
Basophils Relative: 0 %
Eosinophils Absolute: 0.1 10*3/uL (ref 0.0–0.5)
Eosinophils Relative: 1 %
HCT: 27.2 % — ABNORMAL LOW (ref 39.0–52.0)
Hemoglobin: 9.5 g/dL — ABNORMAL LOW (ref 13.0–17.0)
Immature Granulocytes: 0 %
Lymphocytes Relative: 18 %
Lymphs Abs: 1.6 10*3/uL (ref 0.7–4.0)
MCH: 31.8 pg (ref 26.0–34.0)
MCHC: 34.9 g/dL (ref 30.0–36.0)
MCV: 91 fL (ref 80.0–100.0)
Monocytes Absolute: 0.9 10*3/uL (ref 0.1–1.0)
Monocytes Relative: 10 %
Neutro Abs: 6.1 10*3/uL (ref 1.7–7.7)
Neutrophils Relative %: 71 %
Platelet Count: 215 10*3/uL (ref 150–400)
RBC: 2.99 MIL/uL — ABNORMAL LOW (ref 4.22–5.81)
RDW: 17.1 % — ABNORMAL HIGH (ref 11.5–15.5)
WBC Count: 8.7 10*3/uL (ref 4.0–10.5)
nRBC: 0 % (ref 0.0–0.2)

## 2023-02-27 LAB — CMP (CANCER CENTER ONLY)
ALT: 10 U/L (ref 0–44)
AST: 11 U/L — ABNORMAL LOW (ref 15–41)
Albumin: 3.6 g/dL (ref 3.5–5.0)
Alkaline Phosphatase: 109 U/L (ref 38–126)
Anion gap: 13 (ref 5–15)
BUN: 22 mg/dL (ref 8–23)
CO2: 24 mmol/L (ref 22–32)
Calcium: 9.4 mg/dL (ref 8.9–10.3)
Chloride: 103 mmol/L (ref 98–111)
Creatinine: 1.37 mg/dL — ABNORMAL HIGH (ref 0.61–1.24)
GFR, Estimated: 55 mL/min — ABNORMAL LOW (ref >60)
Glucose, Bld: 170 mg/dL — ABNORMAL HIGH (ref 70–99)
Potassium: 2.8 mmol/L — ABNORMAL LOW (ref 3.5–5.1)
Sodium: 140 mmol/L (ref 135–145)
Total Bilirubin: 1.2 mg/dL (ref 0.3–1.2)
Total Protein: 6 g/dL — ABNORMAL LOW (ref 6.5–8.1)

## 2023-02-27 MED ORDER — LOPERAMIDE HCL 2 MG PO CAPS
4.0000 mg | ORAL_CAPSULE | Freq: Once | ORAL | Status: AC
  Administered 2023-02-27: 4 mg via ORAL
  Filled 2023-02-27: qty 2

## 2023-02-27 MED ORDER — SODIUM CHLORIDE 0.9 % IV BOLUS
500.0000 mL | Freq: Once | INTRAVENOUS | Status: AC
  Administered 2023-02-27: 500 mL via INTRAVENOUS

## 2023-02-27 MED ORDER — SODIUM CHLORIDE 0.9 % IV SOLN: Freq: Once | INTRAVENOUS | Status: AC

## 2023-02-27 MED ORDER — POTASSIUM CHLORIDE 10 MEQ/100ML IV SOLN
10.0000 meq | Freq: Once | INTRAVENOUS | Status: DC
  Filled 2023-02-27: qty 100

## 2023-02-27 MED ORDER — POTASSIUM CHLORIDE 10 MEQ/100ML IV SOLN
10.0000 meq | Freq: Once | INTRAVENOUS | Status: AC
  Administered 2023-02-27: 10 meq via INTRAVENOUS
  Filled 2023-02-27: qty 100

## 2023-02-27 MED ORDER — POTASSIUM CHLORIDE CRYS ER 20 MEQ PO TBCR
40.0000 meq | EXTENDED_RELEASE_TABLET | Freq: Once | ORAL | Status: AC
  Administered 2023-02-27: 40 meq via ORAL
  Filled 2023-02-27: qty 2

## 2023-02-27 MED ORDER — ONDANSETRON HCL 4 MG/2ML IJ SOLN
4.0000 mg | Freq: Once | INTRAMUSCULAR | Status: AC
  Administered 2023-02-27: 4 mg via INTRAVENOUS
  Filled 2023-02-27: qty 2

## 2023-02-27 NOTE — Discharge Instructions (Signed)
Follow-up with your doctor later this week °

## 2023-02-27 NOTE — Progress Notes (Signed)
Patient presented to infusion clinic today with spouse. Upon bringing pt back in to infusion, he stated that he had not been feeling well, c/o feeling "wobbly, couldn't control myself", "breathing hard", pt denied any SOB, c/o not being able to hold solid food down x 1 week. Pt HR 109, all other VS stable at initial assessment. This RN reached to Dr. Candise Che. Dr Candise Che stated to hold treatment today and ordered 1 L NS over 2 hours and 20 mEq of K+ to be given today while in infusion d/t K+ 2.8. This RN informed patient of plan and he was agreeable. Approximately 20 minutes in to the K+ infusion, pt stated he "had diarrhea, I couldn't control it". This RN paused the K+ infusion. Katelyn, NT assisted with getting patient cleaned up while this RN reached back out to Dr Candise Che. Dr Candise Che suggested patient be seen in ED for further evaluation and correction of potassium levels. Patient and spouse agreeable to plan. Pt transferred to ED via wheelchair. Report given to Morrie Sheldon, RN in the ED.

## 2023-02-27 NOTE — ED Triage Notes (Signed)
Pt to ED from cancer center c/o low potassium. Pt has known low potassium, cancer center to transfuse with K+, 20 mins into infusion , pt had diarrhea/ not tolerating, so transfer to ED. Pt legally blind. #22 R forearm.

## 2023-02-27 NOTE — ED Notes (Signed)
Pt had large loose stool. This RN and nursing student to change pt.

## 2023-02-27 NOTE — Patient Instructions (Signed)
Hypokalemia Hypokalemia means that the amount of potassium in the blood is lower than normal. Potassium is a mineral (electrolyte) that helps regulate the amount of fluid in the body. It also stimulates muscle tightening (contraction) and helps nerves work properly. Normally, most of the body's potassium is inside cells, and only a very small amount is in the blood. Because the amount in the blood is so small, minor changes to potassium levels in the blood can be life-threatening. What are the causes? This condition may be caused by: Antibiotic medicine. Diarrhea or vomiting. Taking too much of a medicine that helps you have a bowel movement (laxative) can cause diarrhea and lead to hypokalemia. Chronic kidney disease (CKD). Medicines that help the body get rid of excess fluid (diuretics). Eating disorders, such as anorexia or bulimia. Low magnesium levels in the body. Sweating a lot. What are the signs or symptoms? Symptoms of this condition include: Weakness. Constipation. Fatigue. Muscle cramps. Mental confusion. Skipped heartbeats or irregular heartbeat (palpitations). Tingling or numbness. How is this diagnosed? This condition is diagnosed with a blood test. How is this treated? This condition may be treated by: Taking potassium supplements. Adjusting the medicines that you take. Eating more foods that contain a lot of potassium. If your potassium level is very low, you may need to get potassium through an IV and be monitored in the hospital. Follow these instructions at home: Eating and drinking  Eat a healthy diet. A healthy diet includes fresh fruits and vegetables, whole grains, healthy fats, and lean proteins. If told, eat more foods that contain a lot of potassium. These include: Nuts, such as peanuts and pistachios. Seeds, such as sunflower seeds and pumpkin seeds. Peas, lentils, and lima beans. Whole grain and bran cereals and breads. Fresh fruits and vegetables,  such as apricots, avocado, bananas, cantaloupe, kiwi, oranges, tomatoes, asparagus, and potatoes. Juices, such as orange, tomato, and prune. Lean meats, including fish. Milk and milk products, such as yogurt. General instructions Take over-the-counter and prescription medicines only as told by your health care provider. This includes vitamins, natural food products, and supplements. Keep all follow-up visits. This is important. Contact a health care provider if: You have weakness that gets worse. You feel your heart pounding or racing. You vomit. You have diarrhea. You have diabetes and you have trouble keeping your blood sugar in your target range. Get help right away if: You have chest pain. You have shortness of breath. You have vomiting or diarrhea that lasts for more than 2 days. You faint. These symptoms may be an emergency. Get help right away. Call 911. Do not wait to see if the symptoms will go away. Do not drive yourself to the hospital. Summary Hypokalemia means that the amount of potassium in the blood is lower than normal. This condition is diagnosed with a blood test. Hypokalemia may be treated by taking potassium supplements, adjusting the medicines that you take, or eating more foods that are high in potassium. If your potassium level is very low, you may need to get potassium through an IV and be monitored in the hospital. This information is not intended to replace advice given to you by your health care provider. Make sure you discuss any questions you have with your health care provider. Document Revised: 01/06/2021 Document Reviewed: 01/06/2021 Elsevier Patient Education  2024 [employer]  

## 2023-02-28 NOTE — ED Provider Notes (Signed)
EMERGENCY DEPARTMENT AT [facility] Provider Note   CSN: [id number] Arrival date & time: 02/27/23  1735     History  Chief Complaint  Patient presents with   Abnormal Lab    Scott Bass is a 72 y.o. male.  Patient has a history of cancer.  And was at the office of his oncologist.  His potassium was low and they tried to give him some potassium but he started to have some diarrhea so he was sent to the emergency department  The history is provided by the patient and medical records. No language interpreter was used.  Weakness Severity:  Mild Onset quality:  Sudden Timing:  Constant Progression:  Waxing and waning Chronicity:  New Context: not alcohol use   Relieved by:  Nothing Worsened by:  Nothing Ineffective treatments:  None tried Associated symptoms: no abdominal pain, no chest pain, no cough, no diarrhea, no frequency, no headaches and no seizures        Home Medications Prior to Admission medications   Medication Sig Start Date End Date Taking? Authorizing Provider  acetaminophen (TYLENOL) 500 MG tablet Take 2 tablets (1,000 mg total) by mouth every 8 (eight) hours as needed for moderate pain. 12/29/22  Yes Zigmund Daniel., MD  brimonidine [facility]) 0.2 % ophthalmic solution Place 1 drop into both eyes 2 (two) times daily. 01/17/15  Yes [provider]  latanoprost (XALATAN) 0.005 % ophthalmic solution Place 1 drop into both eyes every evening.   Yes [provider]  levothyroxine (SYNTHROID, LEVOTHROID) 150 MCG tablet Take 150 mcg by mouth daily before breakfast. 12/10/14  Yes [provider]  metFORMIN (GLUCOPHAGE) 1000 MG tablet Take 1,000 mg by mouth 2 (two) times daily with a meal.   Yes [provider]  midodrine (PROAMATINE) 2.5 MG tablet Take 1 tablet (2.5 mg total) by mouth 2 (two) times daily with a meal. Patient taking differently: Take 2.5 mg by mouth in the morning. 02/01/23  Yes Marinda Elk, MD  ondansetron (ZOFRAN) 4 MG tablet Take 4 mg by mouth every 8 (eight) hours as needed for nausea or vomiting.   Yes [provider]  rosuvastatin (CRESTOR) 10 MG tablet Take 10 mg by mouth at bedtime.   Yes [provider]  TUMS 500 MG chewable tablet Chew 1-2 tablets by mouth 2 (two) times daily as needed for indigestion or heartburn.   Yes [provider]  acyclovir (ZOVIRAX) 400 MG tablet Take 1 tablet (400 mg total) by mouth 2 (two) times daily. Patient not taking: Reported on 02/27/2023 01/04/23   Johney Maine, MD  pantoprazole (PROTONIX) 40 MG tablet Take 1 tablet (40 mg total) by mouth daily. Patient not taking: Reported on 02/27/2023 12/29/22   Zigmund Daniel., MD      Allergies    Hctz [hydrochlorothiazide]    Review of Systems   Review of Systems  Constitutional:  Negative for appetite change and fatigue.  HENT:  Negative for congestion, ear discharge and sinus pressure.   Eyes:  Negative for discharge.  Respiratory:  Negative for cough.   Cardiovascular:  Negative for chest pain.  Gastrointestinal:  Negative for abdominal pain and diarrhea.  Genitourinary:  Negative for frequency and hematuria.  Musculoskeletal:  Negative for back pain.  Skin:  Negative for rash.  Neurological:  Positive for weakness. Negative for seizures and headaches.  Psychiatric/Behavioral:  Negative for hallucinations.     Physical Exam Updated  Vital Signs BP 138/84   Pulse 89   Temp 97.7 F (36.5 C) (Oral)   Resp 20   Ht 5' 6.5" (1.689 m)   Wt 62.1 kg   SpO2 100%   BMI 21.78 kg/m  Physical Exam Vitals and nursing note reviewed.  Constitutional:      Appearance: He is well-developed.  HENT:     Head: Normocephalic.     Nose: Nose normal.  Eyes:     General: No scleral icterus.    Conjunctiva/sclera: Conjunctivae normal.  Neck:     Thyroid: No thyromegaly.  Cardiovascular:     Rate and Rhythm: Normal rate and regular rhythm.      Heart sounds: No murmur heard.    No friction rub. No gallop.  Pulmonary:     Breath sounds: No stridor. No wheezing or rales.  Chest:     Chest wall: No tenderness.  Abdominal:     General: There is no distension.     Tenderness: There is no abdominal tenderness. There is no rebound.  Musculoskeletal:        General: Normal range of motion.     Cervical back: Neck supple.  Lymphadenopathy:     Cervical: No cervical adenopathy.  Skin:    Findings: No erythema or rash.  Neurological:     Mental Status: He is alert and oriented to person, place, and time.     Motor: No abnormal muscle tone.     Coordination: Coordination normal.  Psychiatric:        Behavior: Behavior normal.     ED Results / Procedures / Treatments   Labs (all labs ordered are listed, but only abnormal results are displayed) Labs Reviewed - No data to display  EKG None  Radiology No results found.  Procedures Procedures    Medications Ordered in ED Medications  sodium chloride 0.9 % bolus 500 mL (0 mLs Intravenous Stopped 02/27/23 2230)  potassium chloride 10 mEq in 100 mL IVPB (0 mEq Intravenous Stopped 02/27/23 2150)  potassium chloride 10 mEq in 100 mL IVPB (0 mEq Intravenous Stopped 02/27/23 1927)  ondansetron (ZOFRAN) injection 4 mg (4 mg Intravenous Given 02/27/23 1813)  potassium chloride SA (KLOR-CON M) CR tablet 40 mEq (40 mEq Oral Given 02/27/23 1817)  loperamide (IMODIUM) capsule 4 mg (4 mg Oral Given 02/27/23 2002)    ED Course/ Medical Decision Making/ A&P   CRITICAL CARE Performed by: Bethann Berkshire Total critical care time: 35 minutes Critical care time was exclusive of separately billable procedures and treating other patients. Critical care was necessary to treat or prevent imminent or life-threatening deterioration. Critical care was time spent personally by me on the following activities: development of treatment plan with patient and/or surrogate as well as nursing,  discussions with consultants, evaluation of patient's response to treatment, examination of patient, obtaining history from patient or surrogate, ordering and performing treatments and interventions, ordering and review of laboratory studies, ordering and review of radiographic studies, pulse oximetry and re-evaluation of patient's condition.  Click here for ABCD2, HEART and other calculatorsREFRESH Note before signing :1}                              Medical Decision Making Risk Prescription drug management.   Patient with hypokalemia.  He is given 2 runs of potassium IV and 1 p.o.  He is then discharged home to follow-up with his oncologist  Final Clinical Impression(s) / ED Diagnoses Final diagnoses:  Hypokalemia    Rx / DC Orders ED Discharge Orders     None         Bethann Berkshire, MD 02/28/23 1316

## 2023-03-13 ENCOUNTER — Inpatient Hospital Stay: Payer: Medicare PPO | Admitting: Hematology

## 2023-03-13 ENCOUNTER — Other Ambulatory Visit: Payer: Self-pay

## 2023-03-13 ENCOUNTER — Inpatient Hospital Stay: Payer: Medicare PPO

## 2023-03-13 ENCOUNTER — Inpatient Hospital Stay: Payer: Medicare PPO | Attending: Hematology

## 2023-03-13 VITALS — HR 94

## 2023-03-13 VITALS — BP 117/85 | HR 120 | Temp 97.7°F | Resp 18 | Wt 127.4 lb

## 2023-03-13 DIAGNOSIS — R0789 Other chest pain: Secondary | ICD-10-CM | POA: Diagnosis not present

## 2023-03-13 DIAGNOSIS — G4733 Obstructive sleep apnea (adult) (pediatric): Secondary | ICD-10-CM | POA: Diagnosis not present

## 2023-03-13 DIAGNOSIS — C9000 Multiple myeloma not having achieved remission: Secondary | ICD-10-CM

## 2023-03-13 DIAGNOSIS — I129 Hypertensive chronic kidney disease with stage 1 through stage 4 chronic kidney disease, or unspecified chronic kidney disease: Secondary | ICD-10-CM | POA: Diagnosis not present

## 2023-03-13 DIAGNOSIS — Z681 Body mass index (BMI) 19 or less, adult: Secondary | ICD-10-CM | POA: Diagnosis not present

## 2023-03-13 DIAGNOSIS — D696 Thrombocytopenia, unspecified: Secondary | ICD-10-CM | POA: Diagnosis not present

## 2023-03-13 DIAGNOSIS — Z8249 Family history of ischemic heart disease and other diseases of the circulatory system: Secondary | ICD-10-CM | POA: Insufficient documentation

## 2023-03-13 DIAGNOSIS — W07XXXA Fall from chair, initial encounter: Secondary | ICD-10-CM | POA: Insufficient documentation

## 2023-03-13 DIAGNOSIS — Z7189 Other specified counseling: Secondary | ICD-10-CM

## 2023-03-13 DIAGNOSIS — D649 Anemia, unspecified: Secondary | ICD-10-CM | POA: Diagnosis not present

## 2023-03-13 DIAGNOSIS — E1122 Type 2 diabetes mellitus with diabetic chronic kidney disease: Secondary | ICD-10-CM | POA: Insufficient documentation

## 2023-03-13 DIAGNOSIS — H409 Unspecified glaucoma: Secondary | ICD-10-CM | POA: Insufficient documentation

## 2023-03-13 DIAGNOSIS — R630 Anorexia: Secondary | ICD-10-CM | POA: Insufficient documentation

## 2023-03-13 DIAGNOSIS — Z833 Family history of diabetes mellitus: Secondary | ICD-10-CM | POA: Insufficient documentation

## 2023-03-13 DIAGNOSIS — N189 Chronic kidney disease, unspecified: Secondary | ICD-10-CM | POA: Diagnosis not present

## 2023-03-13 DIAGNOSIS — R197 Diarrhea, unspecified: Secondary | ICD-10-CM | POA: Insufficient documentation

## 2023-03-13 DIAGNOSIS — H548 Legal blindness, as defined in USA: Secondary | ICD-10-CM | POA: Insufficient documentation

## 2023-03-13 DIAGNOSIS — R350 Frequency of micturition: Secondary | ICD-10-CM | POA: Diagnosis not present

## 2023-03-13 DIAGNOSIS — R1030 Lower abdominal pain, unspecified: Secondary | ICD-10-CM | POA: Diagnosis not present

## 2023-03-13 DIAGNOSIS — Z5112 Encounter for antineoplastic immunotherapy: Secondary | ICD-10-CM | POA: Diagnosis present

## 2023-03-13 DIAGNOSIS — R410 Disorientation, unspecified: Secondary | ICD-10-CM | POA: Insufficient documentation

## 2023-03-13 DIAGNOSIS — N179 Acute kidney failure, unspecified: Secondary | ICD-10-CM | POA: Diagnosis not present

## 2023-03-13 DIAGNOSIS — Z79624 Long term (current) use of inhibitors of nucleotide synthesis: Secondary | ICD-10-CM | POA: Insufficient documentation

## 2023-03-13 DIAGNOSIS — Z79620 Long term (current) use of immunosuppressive biologic: Secondary | ICD-10-CM | POA: Diagnosis not present

## 2023-03-13 DIAGNOSIS — Z803 Family history of malignant neoplasm of breast: Secondary | ICD-10-CM | POA: Insufficient documentation

## 2023-03-13 DIAGNOSIS — Z7984 Long term (current) use of oral hypoglycemic drugs: Secondary | ICD-10-CM | POA: Insufficient documentation

## 2023-03-13 DIAGNOSIS — E876 Hypokalemia: Secondary | ICD-10-CM | POA: Insufficient documentation

## 2023-03-13 DIAGNOSIS — Z79899 Other long term (current) drug therapy: Secondary | ICD-10-CM | POA: Insufficient documentation

## 2023-03-13 DIAGNOSIS — R110 Nausea: Secondary | ICD-10-CM | POA: Insufficient documentation

## 2023-03-13 DIAGNOSIS — M25552 Pain in left hip: Secondary | ICD-10-CM | POA: Insufficient documentation

## 2023-03-13 LAB — CBC WITH DIFFERENTIAL (CANCER CENTER ONLY)
Abs Immature Granulocytes: 0.01 10*3/uL (ref 0.00–0.07)
Basophils Absolute: 0 10*3/uL (ref 0.0–0.1)
Basophils Relative: 1 %
Eosinophils Absolute: 0 10*3/uL (ref 0.0–0.5)
Eosinophils Relative: 1 %
HCT: 25.8 % — ABNORMAL LOW (ref 39.0–52.0)
Hemoglobin: 8.8 g/dL — ABNORMAL LOW (ref 13.0–17.0)
Immature Granulocytes: 0 %
Lymphocytes Relative: 26 %
Lymphs Abs: 1.5 10*3/uL (ref 0.7–4.0)
MCH: 31.7 pg (ref 26.0–34.0)
MCHC: 34.1 g/dL (ref 30.0–36.0)
MCV: 92.8 fL (ref 80.0–100.0)
Monocytes Absolute: 0.8 10*3/uL (ref 0.1–1.0)
Monocytes Relative: 14 %
Neutro Abs: 3.3 10*3/uL (ref 1.7–7.7)
Neutrophils Relative %: 58 %
Platelet Count: 277 10*3/uL (ref 150–400)
RBC: 2.78 MIL/uL — ABNORMAL LOW (ref 4.22–5.81)
RDW: 16.1 % — ABNORMAL HIGH (ref 11.5–15.5)
WBC Count: 5.7 10*3/uL (ref 4.0–10.5)
nRBC: 0 % (ref 0.0–0.2)

## 2023-03-13 LAB — CMP (CANCER CENTER ONLY)
ALT: 6 U/L (ref 0–44)
AST: 9 U/L — ABNORMAL LOW (ref 15–41)
Albumin: 3.4 g/dL — ABNORMAL LOW (ref 3.5–5.0)
Alkaline Phosphatase: 103 U/L (ref 38–126)
Anion gap: 11 (ref 5–15)
BUN: 9 mg/dL (ref 8–23)
CO2: 23 mmol/L (ref 22–32)
Calcium: 8.9 mg/dL (ref 8.9–10.3)
Chloride: 107 mmol/L (ref 98–111)
Creatinine: 1.02 mg/dL (ref 0.61–1.24)
GFR, Estimated: >60 mL/min (ref >60)
Glucose, Bld: 106 mg/dL — ABNORMAL HIGH (ref 70–99)
Potassium: 3.1 mmol/L — ABNORMAL LOW (ref 3.5–5.1)
Sodium: 141 mmol/L (ref 135–145)
Total Bilirubin: 1.1 mg/dL (ref <1.2)
Total Protein: 5.8 g/dL — ABNORMAL LOW (ref 6.5–8.1)

## 2023-03-13 MED ORDER — POTASSIUM CHLORIDE CRYS ER 20 MEQ PO TBCR: 20.0000 meq | EXTENDED_RELEASE_TABLET | Freq: Two times a day (BID) | ORAL | 0 refills | Status: DC

## 2023-03-13 MED ORDER — DARATUMUMAB-HYALURONIDASE-FIHJ 1800-30000 MG-UT/15ML ~~LOC~~ SOLN
1800.0000 mg | Freq: Once | SUBCUTANEOUS | Status: AC
  Administered 2023-03-13: 1800 mg via SUBCUTANEOUS
  Filled 2023-03-13: qty 15

## 2023-03-13 MED ORDER — DEXAMETHASONE 4 MG PO TABS
20.0000 mg | ORAL_TABLET | Freq: Once | ORAL | Status: AC
  Administered 2023-03-13: 20 mg via ORAL
  Filled 2023-03-13: qty 5

## 2023-03-13 MED ORDER — DIPHENHYDRAMINE HCL 25 MG PO CAPS
50.0000 mg | ORAL_CAPSULE | Freq: Once | ORAL | Status: AC
  Administered 2023-03-13: 50 mg via ORAL
  Filled 2023-03-13: qty 2

## 2023-03-13 MED ORDER — METFORMIN HCL 1000 MG PO TABS: 1000.0000 mg | ORAL_TABLET | Freq: Every day | ORAL | Status: AC

## 2023-03-13 MED ORDER — ACETAMINOPHEN 325 MG PO TABS
650.0000 mg | ORAL_TABLET | Freq: Once | ORAL | Status: AC
Start: 2023-03-13 — End: 2023-03-13
  Administered 2023-03-13: 650 mg via ORAL
  Filled 2023-03-13: qty 2

## 2023-03-13 MED ORDER — FAMOTIDINE 20 MG PO TABS
20.0000 mg | ORAL_TABLET | Freq: Once | ORAL | Status: AC
  Administered 2023-03-13: 20 mg via ORAL
  Filled 2023-03-13: qty 1

## 2023-03-13 NOTE — Progress Notes (Signed)
HEMATOLOGY/ONCOLOGY CLINIC NOTE  Date of Service: 03/13/2023  Patient Care Team: Tracey Harries, MD as PCP - General (Family Medicine)  CHIEF COMPLAINTS/PURPOSE OF CONSULTATION:  Evaluation and management of multiple myeloma.  HISTORY OF PRESENTING ILLNESS:  Scott Bass is a wonderful 72 y.o. male who has been referred to [place] by Tracey Harries, MD for evaluation and management of multiple myeloma.  Patient was in ED for symptomatic anemia on 09/08/2022. Patient presented to the ED and was hospitalized on 09/25/2022 for acute renal failure.   Today, he presents in a wheelchair and is accompanied by his wife. He reports that he is intermittently nauseous. He does not take anything to manage nausea. He denies any acid reflux symptoms.  He does have mild abdominal pain. He has had one bowel movement this week and does endorse a cycle of constipation and diarrhea. He reports recent poor p.o intake due to loss of appetite over the last month. He reports a weight loss of 5-6 pounds. He denies any leg swelling, radiation exposure, or neuropathy.  He reports that receiving blood transfusions during his hospitalization did improve energy levels. He was given Oxycodone at the hospital which he has run out of. He currently uses Tylenol for pain management.   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 Patient's wife is also blind. He reports that he is able to manage daily activities with his wife at home.   Patient suffered a fall from a chair after it broke. He reports worsened fatigue which began one month ago. He denies any syncope or lightheadedness/dizziness.  He complains of chest wall pain as well as left hip pain in the groin and laterally. This pain has improved.   His DM2 is fairly well-controlled. He reports that he needs a Metformin refill. He has self-discontinued Jardiance 1-2 months ago as he thought that it would affect blood sugars levels. His blood sugars at home have been  fairly stable with its highest level at 145.   His wife reports that patient has frequent urination with a burning sensation. Patient's wife reports that his confusion has improved by 80%. Patient reports that he is occasionally confused. She reports that patient may need assistance with bedside commode. Patient reports that he hospital has initiated home care services. Patient reports that he does not have a nephrologist.  INTERVAL HISTORY:  Scott Bass is a 72 y.o. male here for continued evaluation and management of multiple myeloma. He is here for his cycle 4 day 1 of treatment. Hold Velcade during this visit.   Patient was last seen by me on 02/13/2023 and he complained of nausea when consuming certain food and slightly abnormal bowel movement.  Patient was admitted to the hospital on 02/27/2023 for hypokalemia as his potassium was low during clinic visit at the oncology center. He was given IV Potassium at the clinic but the patient started having diarrhea. At the ED, he was given 2 runs of potassium IV and 1 p.o. He was discharged the same day.  Patient is accompanied by his wife during this visit. Patient notes he has been doing fairly well since our last visit. Patient notes he has not been eating well at home, which has been causing him to lose weight. He has lost 10 lbs since 02/27/2023, from 137 lbs to 127 lbs.   Patient notes his diarrhea has not improved since he got discharged from the hospital.   He notes he used to drink Boost supplement,  but he discontinued since it was causing him diarrhea.   Patient currently takes Metformin twice a day for his diabetes.   He denies any difficulties accessing food.  He denies any new infection issues, fever, chills, urine changes, night sweats, back pain, abdominal pain, or leg swelling. He does complain of mild lethargy due to appetite loss.   Patient's wife notes that the patient is occasionally nauseous before he eats food.    Patient currently takes Vitamin B-12 supplement. He denies taking any potassium supplement.   MEDICAL HISTORY:  Past Medical History:  Diagnosis Date   Arthritis    "lower back" (12/18/2017)   Better eye: moderate vision impairment; lesser eye: blind    GERD (gastroesophageal reflux disease)    Glaucoma, both eyes    High cholesterol    Hypertension    Hypothyroidism    Irregular heartbeat    Legally blind    "both eyes; can see some out of left eye"   OSA (obstructive sleep apnea)    Thyroid disease    Type II diabetes mellitus (HCC)    Vitamin B12 deficiency     SURGICAL HISTORY: Past Surgical History:  Procedure Laterality Date   LEFT HEART CATH AND CORONARY ANGIOGRAPHY N/A 12/18/2017   Procedure: LEFT HEART CATH AND CORONARY ANGIOGRAPHY;  Surgeon: Elder Negus, MD;  Location: [facility];  Service: Cardiovascular;  Laterality: N/A;   NOSE SURGERY     "was crooked; they straightened it out" (12/18/2017)    SOCIAL HISTORY: Social History   Socioeconomic History   Marital status: Married    Spouse name: Not on file   Number of children: 0   Years of education: Not on file   Highest education level: Not on file  Occupational History   Occupation: retired  Tobacco Use   Smoking status: Never   Smokeless tobacco: Never  Vaping Use   Vaping status: Never Used  Substance and Sexual Activity   Alcohol use: Not Currently    Comment: occ   Drug use: Never   Sexual activity: Not Currently  Other Topics Concern   Not on file  Social History Narrative   Not on file   Social Determinants of Health   Financial Resource Strain: Low Risk  (10/10/2022)   Received from [facility], [facility]   Overall Financial Resource Strain (CARDIA)    Difficulty of Paying Living Expenses: Not very hard  Food Insecurity: No Food Insecurity (01/28/2023)   Hunger Vital Sign    Worried About Running Out of Food in the Last Year: Never true    Ran Out of Food in the  Last Year: Never true  Recent Concern: Food Insecurity - Food Insecurity Present (12/06/2022)   Hunger Vital Sign    Worried About Running Out of Food in the Last Year: Often true    Ran Out of Food in the Last Year: Often true  Transportation Needs: No Transportation Needs (01/28/2023)   PRAPARE - Administrator, Civil Service (Medical): No    Lack of Transportation (Non-Medical): No  Physical Activity: Insufficiently Active (12/23/2020)   Received from [facility], [facility]   Exercise Vital Sign    Days of Exercise per Week: 2 days    Minutes of Exercise per Session: 10 min  Stress: No Stress Concern Present (12/23/2020)   Received from [facility], [facility] - Occupational Stress Questionnaire    Feeling of  Stress : Not at all  Social Connections: Unknown (09/16/2021)   Received from [facility], [facility]   Social Network    Social Network: Not on file  Intimate Partner Violence: Not At Risk (01/28/2023)   Humiliation, Afraid, Rape, and Kick questionnaire    Fear of Current or Ex-Partner: No    Emotionally Abused: No    Physically Abused: No    Sexually Abused: No    FAMILY HISTORY: Family History  Problem Relation Age of Onset   Diabetes Mellitus II Mother 40   Diabetes Mellitus II Sister    Breast cancer Sister    Heart failure Sister 18    ALLERGIES:  is allergic to hctz [hydrochlorothiazide].  MEDICATIONS:  Current Outpatient Medications  Medication Sig Dispense Refill   acetaminophen (TYLENOL) 500 MG tablet Take 2 tablets (1,000 mg total) by mouth every 8 (eight) hours as needed for moderate pain.     acyclovir (ZOVIRAX) 400 MG tablet Take 1 tablet (400 mg total) by mouth 2 (two) times daily. (Patient not taking: Reported on 02/27/2023) 60 tablet 5   brimonidine (ALPHAGAN) 0.2 % ophthalmic solution Place 1 drop into both eyes 2 (two) times daily.  0   latanoprost (XALATAN) 0.005 % ophthalmic  solution Place 1 drop into both eyes every evening.     levothyroxine (SYNTHROID, LEVOTHROID) 150 MCG tablet Take 150 mcg by mouth daily before breakfast.  0   metFORMIN (GLUCOPHAGE) 1000 MG tablet Take 1,000 mg by mouth 2 (two) times daily with a meal.     midodrine (PROAMATINE) 2.5 MG tablet Take 1 tablet (2.5 mg total) by mouth 2 (two) times daily with a meal. (Patient taking differently: Take 2.5 mg by mouth in the morning.) 60 tablet 1   ondansetron (ZOFRAN) 4 MG tablet Take 4 mg by mouth every 8 (eight) hours as needed for nausea or vomiting.     pantoprazole (PROTONIX) 40 MG tablet Take 1 tablet (40 mg total) by mouth daily. (Patient not taking: Reported on 02/27/2023)     rosuvastatin (CRESTOR) 10 MG tablet Take 10 mg by mouth at bedtime.     TUMS 500 MG chewable tablet Chew 1-2 tablets by mouth 2 (two) times daily as needed for indigestion or heartburn.     No current facility-administered medications for this visit.    REVIEW OF SYSTEMS:    10 Point review of Systems was done is negative except as noted above.   PHYSICAL EXAMINATION: ECOG PERFORMANCE STATUS: 3 - Symptomatic, >50% confined to bed  . Vitals:   03/13/23 1415  BP: 117/85  Pulse: (!) 120  Resp: 18  Temp: 97.7 F (36.5 C)  SpO2: 100%     Filed Weights   03/13/23 1415  Weight: 127 lb 6.4 oz (57.8 kg)    .Body mass index is 20.25 kg/m.   GENERAL:alert, in no acute distress and comfortable SKIN: no acute rashes, no significant lesions EYES: conjunctiva are pink and non-injected, sclera anicteric OROPHARYNX: MMM, no exudates, no oropharyngeal erythema or ulceration NECK: supple, no JVD LYMPH:  no palpable lymphadenopathy in the cervical, axillary or inguinal regions LUNGS: clear to auscultation b/l with normal respiratory effort HEART: regular rate & rhythm ABDOMEN:  normoactive bowel sounds , non tender, not distended. Extremity: no pedal edema PSYCH: alert & oriented x 3 with fluent speech NEURO:  no focal motor/sensory deficits   LABORATORY DATA:  I have reviewed the data as listed .    Latest Ref Rng & Units  03/13/2023    1:21 PM 02/27/2023    1:49 PM 02/20/2023    2:24 PM  CBC  WBC 4.0 - 10.5 K/uL 5.7  8.7  9.6   Hemoglobin 13.0 - 17.0 g/dL 8.8  9.5  16.1   Hematocrit 39.0 - 52.0 % 25.8  27.2  31.4   Platelets 150 - 400 K/uL 277  215  246    .    Latest Ref Rng & Units 03/13/2023    1:21 PM 02/27/2023    1:49 PM 02/20/2023    2:24 PM  CMP  Glucose 70 - 99 mg/dL 096  045  90   BUN 8 - 23 mg/dL 9  22  11    Creatinine 0.61 - 1.24 mg/dL 4.09  8.11  9.14   Sodium 135 - 145 mmol/L 141  140  139   Potassium 3.5 - 5.1 mmol/L 3.1  2.8  3.7   Chloride 98 - 111 mmol/L 107  103  103   CO2 22 - 32 mmol/L 23  24  23    Calcium 8.9 - 10.3 mg/dL 8.9  9.4  8.7   Total Protein 6.5 - 8.1 g/dL 5.8  6.0  5.8   Total Bilirubin <1.2 mg/dL 1.1  1.2  0.9   Alkaline Phos 38 - 126 U/L 103  109  129   AST 15 - 41 U/L 9  11  13    ALT 0 - 44 U/L 6  10  9       Bone Marrow Biopsy 09/29/2022:    RADIOGRAPHIC STUDIES: I have personally reviewed the radiological images as listed and agreed with the findings in the report. No results found.  ASSESSMENT & PLAN:   72 year old legally blind gentleman with significant functional limitations and limited social support presented with who was newly diagnosed with multiple myeloma and of May 2024 and was set up for chemo counseling to start daratumumab dexamethasone Velcade and Xgeva and orders were placed and the patient was to get a PET scan but was lost to follow-up.  He had also been referred to social work at that time to help address his barriers of care. He is now admitted with progressive multiple myeloma.  1. Multiple myeloma, IgG kappa, diagnosed May 2024-untreated since patient did not follow-up now admitted with progressive myeloma with severe anemia and progressive renal failure.  CTs 12/04/2022-innumerable lytic lesions, large expansile  lesion in the anterior right second rib, multiple pathologic rib fractures, expansile T4 (T3 on CT cervical spine) mass with soft tissue component extending into the central canal and left neural foramen, C7 transverse process fracture  Decadron daily x 4 starting 12/05/2022  Cycle 1 day 1 Velcade 12/07/2022  Decadron daily x 4 starting 12/12/2022  Cycle 1 day 8 Velcade 12/14/2022 2.  Acute/chronic renal failure 3.  Severe anemia 4.  Mild thrombocytopenia 5.  Diabetes 6.  Hypertension 7.  Glaucoma 8.  OSA  PLAN: -Discussed lab results from today, 03/13/2023, in detail with the patient. CBC shows patient is anemic with hemoglobin of 8.8 g/dL and hematocrit of 78.2%. CMP shows low potassium level of 3.1, decreased total protein level of 5.8, and low AST level of 9.  -Recommend to follow-up with PCP regarding metformin medication as it can cause weight loss and diarrhea. Most likely need to change metformin medication to another diabetic medication.  -Discussed with the patient to cut down Metformin to once a day until he gets an appointment with PCP. -Recommend Vitamin-D supplement and  B-Complex.  -Recommend to start potassium supplement. I will prescribe potassium medication.  -continue to optimize food and water intake to to support strength -continue Dara/Velcade/Dex. Hold Velcade during this visit.  -Patient has been tolerating his treatment well without any new or severe toxicities.   FOLLOW-UP: Per integrated scheduling MD visit in 4 weeks  The total time spent in the appointment was 31 minutes* .  All of the patient's questions were answered with apparent satisfaction. The patient knows to call the clinic with any problems, questions or concerns.   Wyvonnia Lora MD MS AAHIVMS [facility] Hematology/Oncology Physician [facility]  .*Total Encounter Time as defined by the Centers for Medicare and Medicaid Services includes, in addition to the face-to-face time of a patient  visit (documented in the note above) non-face-to-face time: obtaining and reviewing outside history, ordering and reviewing medications, tests or procedures, care coordination (communications with other health care professionals or caregivers) and documentation in the medical record.   I,Param Shah,acting as a Neurosurgeon for Wyvonnia Lora, MD.,have documented all relevant documentation on the behalf of Wyvonnia Lora, MD,as directed by  Wyvonnia Lora, MD while in the presence of Wyvonnia Lora, MD.   .I have reviewed the above documentation for accuracy and completeness, and I agree with the above. Johney Maine MD

## 2023-03-13 NOTE — Patient Instructions (Signed)
[facility]  Discharge Instructions: Thank you for choosing [facility] to provide your oncology and hematology care.   If you have a lab appointment with the Cancer Center, please go directly to the Cancer Center and check in at the registration area.   Wear comfortable clothing and clothing appropriate for easy access to any Portacath or PICC line.   We strive to give you quality time with your provider. You may need to reschedule your appointment if you arrive late (15 or more minutes).  Arriving late affects you and other patients whose appointments are after yours.  Also, if you miss three or more appointments without notifying the office, you may be dismissed from the clinic at the provider's discretion.      For prescription refill requests, have your pharmacy contact our office and allow 72 hours for refills to be completed.    Today you received the following chemotherapy and/or immunotherapy agents: Daratumumab hyaluronidase (Darzalex Faspro)      To help prevent nausea and vomiting after your treatment, we encourage you to take your nausea medication as directed.  BELOW ARE SYMPTOMS THAT SHOULD BE REPORTED IMMEDIATELY: *FEVER GREATER THAN 100.4 F (38 C) OR HIGHER *CHILLS OR SWEATING *NAUSEA AND VOMITING THAT IS NOT CONTROLLED WITH YOUR NAUSEA MEDICATION *UNUSUAL SHORTNESS OF BREATH *UNUSUAL BRUISING OR BLEEDING *URINARY PROBLEMS (pain or burning when urinating, or frequent urination) *BOWEL PROBLEMS (unusual diarrhea, constipation, pain near the anus) TENDERNESS IN MOUTH AND THROAT WITH OR WITHOUT PRESENCE OF ULCERS (sore throat, sores in mouth, or a toothache) UNUSUAL RASH, SWELLING OR PAIN  UNUSUAL VAGINAL DISCHARGE OR ITCHING   Items with * indicate a potential emergency and should be followed up as soon as possible or go to the Emergency Department if any problems should occur.  Please show the  CHEMOTHERAPY ALERT CARD or IMMUNOTHERAPY ALERT CARD at check-in to the Emergency Department and triage nurse.  Should you have questions after your visit or need to cancel or reschedule your appointment, please contact [facility] - A DEPT OF [facility]  Dept: [phone]  and follow the prompts.  Office hours are 8:00 a.m. to 4:30 p.m. Monday - Friday. Please note that voicemails left after 4:00 p.m. may not be returned until the following business day.  We are closed weekends and major holidays. You have access to a nurse at all times for urgent questions. Please call the main number to the clinic Dept: [phone] and follow the prompts.   For any non-urgent questions, you may also contact your provider using MyChart. We now offer e-Visits for anyone 1 and older to request care online for non-urgent symptoms. For details visit [email or web address].   Also download the MyChart app! Go to the app store, search "MyChart", open the app, select [place], and log in with your MyChart username and password.

## 2023-03-13 NOTE — Progress Notes (Signed)
Patient seen by Dr. Addison Naegeli are not all within treatment parameters. Dr Candise Che aware HR 122, pt anxious today per pt  Labs reviewed: and are not all within treatment parameters. Dr Candise Che aware K: 3.1  Per physician team, patient is ready for treatment. Please note that modifications are being made to the treatment plan including pt to only get Dara today

## 2023-03-15 ENCOUNTER — Other Ambulatory Visit: Payer: Self-pay

## 2023-03-19 ENCOUNTER — Encounter: Payer: Self-pay | Admitting: Hematology

## 2023-03-20 ENCOUNTER — Inpatient Hospital Stay: Payer: Medicare PPO

## 2023-03-20 ENCOUNTER — Encounter: Payer: Self-pay | Admitting: Hematology

## 2023-03-20 ENCOUNTER — Other Ambulatory Visit: Payer: Self-pay | Admitting: Hematology

## 2023-03-20 VITALS — BP 114/73 | HR 65 | Temp 99.1°F | Resp 18 | Wt 127.2 lb

## 2023-03-20 DIAGNOSIS — C9000 Multiple myeloma not having achieved remission: Secondary | ICD-10-CM

## 2023-03-20 DIAGNOSIS — Z7189 Other specified counseling: Secondary | ICD-10-CM

## 2023-03-20 DIAGNOSIS — Z5112 Encounter for antineoplastic immunotherapy: Secondary | ICD-10-CM | POA: Diagnosis not present

## 2023-03-20 LAB — CBC WITH DIFFERENTIAL (CANCER CENTER ONLY)
Abs Immature Granulocytes: 0.02 10*3/uL (ref 0.00–0.07)
Basophils Absolute: 0 10*3/uL (ref 0.0–0.1)
Basophils Relative: 1 %
Eosinophils Absolute: 0 10*3/uL (ref 0.0–0.5)
Eosinophils Relative: 0 %
HCT: 27 % — ABNORMAL LOW (ref 39.0–52.0)
Hemoglobin: 9.2 g/dL — ABNORMAL LOW (ref 13.0–17.0)
Immature Granulocytes: 0 %
Lymphocytes Relative: 24 %
Lymphs Abs: 1.8 10*3/uL (ref 0.7–4.0)
MCH: 32.2 pg (ref 26.0–34.0)
MCHC: 34.1 g/dL (ref 30.0–36.0)
MCV: 94.4 fL (ref 80.0–100.0)
Monocytes Absolute: 0.9 10*3/uL (ref 0.1–1.0)
Monocytes Relative: 12 %
Neutro Abs: 4.8 10*3/uL (ref 1.7–7.7)
Neutrophils Relative %: 63 %
Platelet Count: 256 10*3/uL (ref 150–400)
RBC: 2.86 MIL/uL — ABNORMAL LOW (ref 4.22–5.81)
RDW: 15.9 % — ABNORMAL HIGH (ref 11.5–15.5)
WBC Count: 7.6 10*3/uL (ref 4.0–10.5)
nRBC: 0 % (ref 0.0–0.2)

## 2023-03-20 LAB — CMP (CANCER CENTER ONLY)
ALT: 12 U/L (ref 0–44)
AST: 12 U/L — ABNORMAL LOW (ref 15–41)
Albumin: 3.4 g/dL — ABNORMAL LOW (ref 3.5–5.0)
Alkaline Phosphatase: 98 U/L (ref 38–126)
Anion gap: 14 (ref 5–15)
BUN: 10 mg/dL (ref 8–23)
CO2: 21 mmol/L — ABNORMAL LOW (ref 22–32)
Calcium: 9.1 mg/dL (ref 8.9–10.3)
Chloride: 106 mmol/L (ref 98–111)
Creatinine: 1.06 mg/dL (ref 0.61–1.24)
GFR, Estimated: >60 mL/min (ref >60)
Glucose, Bld: 116 mg/dL — ABNORMAL HIGH (ref 70–99)
Potassium: 3.5 mmol/L (ref 3.5–5.1)
Sodium: 141 mmol/L (ref 135–145)
Total Bilirubin: 1 mg/dL (ref <1.2)
Total Protein: 5.9 g/dL — ABNORMAL LOW (ref 6.5–8.1)

## 2023-03-20 MED ORDER — FAMOTIDINE 20 MG PO TABS
20.0000 mg | ORAL_TABLET | Freq: Once | ORAL | Status: AC
  Administered 2023-03-20: 20 mg via ORAL
  Filled 2023-03-20: qty 1

## 2023-03-20 MED ORDER — ACETAMINOPHEN 325 MG PO TABS
650.0000 mg | ORAL_TABLET | Freq: Once | ORAL | Status: AC
  Administered 2023-03-20: 650 mg via ORAL
  Filled 2023-03-20: qty 2

## 2023-03-20 MED ORDER — DIPHENHYDRAMINE HCL 25 MG PO CAPS
50.0000 mg | ORAL_CAPSULE | Freq: Once | ORAL | Status: AC
  Administered 2023-03-20: 50 mg via ORAL
  Filled 2023-03-20: qty 2

## 2023-03-20 MED ORDER — DEXAMETHASONE 4 MG PO TABS
20.0000 mg | ORAL_TABLET | Freq: Once | ORAL | Status: AC
  Administered 2023-03-20: 20 mg via ORAL
  Filled 2023-03-20: qty 5

## 2023-03-20 MED ORDER — ONDANSETRON HCL 8 MG PO TABS
4.0000 mg | ORAL_TABLET | Freq: Once | ORAL | Status: AC
  Administered 2023-03-20: 4 mg via ORAL
  Filled 2023-03-20: qty 1

## 2023-03-20 MED ORDER — DARATUMUMAB-HYALURONIDASE-FIHJ 1800-30000 MG-UT/15ML ~~LOC~~ SOLN
1800.0000 mg | Freq: Once | SUBCUTANEOUS | Status: AC
Start: 2023-03-20 — End: 2023-03-20
  Administered 2023-03-20: 1800 mg via SUBCUTANEOUS
  Filled 2023-03-20: qty 15

## 2023-03-20 NOTE — Patient Instructions (Signed)

## 2023-03-20 NOTE — Progress Notes (Signed)
Pt reported to infusion today with c/o decreased appetite and weakness. Pt had one episode of vomiting in infusion today after eating crackers. Pt expressed to this RN that the crackers were the first thing he had consumed today. This RN made Dr. Candise Che aware. This RN received V/O per Dr. Candise Che for Zofran PO 4 mg to be given once with premedications today. This RN made Pt aware, and Pt verbalized understanding and was agreeable with the addition of anti-nausea meds.

## 2023-03-27 ENCOUNTER — Inpatient Hospital Stay (HOSPITAL_BASED_OUTPATIENT_CLINIC_OR_DEPARTMENT_OTHER): Payer: Medicare PPO | Admitting: Hematology

## 2023-03-27 ENCOUNTER — Inpatient Hospital Stay: Payer: Medicare PPO

## 2023-03-27 VITALS — BP 110/66 | HR 67 | Temp 97.8°F | Resp 16 | Ht 65.6 in | Wt 119.4 lb

## 2023-03-27 DIAGNOSIS — Z7189 Other specified counseling: Secondary | ICD-10-CM | POA: Diagnosis not present

## 2023-03-27 DIAGNOSIS — C9000 Multiple myeloma not having achieved remission: Secondary | ICD-10-CM

## 2023-03-27 DIAGNOSIS — Z5112 Encounter for antineoplastic immunotherapy: Secondary | ICD-10-CM | POA: Diagnosis not present

## 2023-03-27 LAB — CBC WITH DIFFERENTIAL (CANCER CENTER ONLY)
Abs Immature Granulocytes: 0.02 10*3/uL (ref 0.00–0.07)
Basophils Absolute: 0 10*3/uL (ref 0.0–0.1)
Basophils Relative: 0 %
Eosinophils Absolute: 0 10*3/uL (ref 0.0–0.5)
Eosinophils Relative: 0 %
HCT: 27.2 % — ABNORMAL LOW (ref 39.0–52.0)
Hemoglobin: 9.3 g/dL — ABNORMAL LOW (ref 13.0–17.0)
Immature Granulocytes: 0 %
Lymphocytes Relative: 21 %
Lymphs Abs: 1.9 10*3/uL (ref 0.7–4.0)
MCH: 32 pg (ref 26.0–34.0)
MCHC: 34.2 g/dL (ref 30.0–36.0)
MCV: 93.5 fL (ref 80.0–100.0)
Monocytes Absolute: 0.8 10*3/uL (ref 0.1–1.0)
Monocytes Relative: 8 %
Neutro Abs: 6.5 10*3/uL (ref 1.7–7.7)
Neutrophils Relative %: 71 %
Platelet Count: 293 10*3/uL (ref 150–400)
RBC: 2.91 MIL/uL — ABNORMAL LOW (ref 4.22–5.81)
RDW: 15 % (ref 11.5–15.5)
WBC Count: 9.2 10*3/uL (ref 4.0–10.5)
nRBC: 0 % (ref 0.0–0.2)

## 2023-03-27 LAB — CMP (CANCER CENTER ONLY)
ALT: 10 U/L (ref 0–44)
AST: 12 U/L — ABNORMAL LOW (ref 15–41)
Albumin: 3.5 g/dL (ref 3.5–5.0)
Alkaline Phosphatase: 94 U/L (ref 38–126)
Anion gap: 12 (ref 5–15)
BUN: 14 mg/dL (ref 8–23)
CO2: 23 mmol/L (ref 22–32)
Calcium: 9.2 mg/dL (ref 8.9–10.3)
Chloride: 105 mmol/L (ref 98–111)
Creatinine: 1.23 mg/dL (ref 0.61–1.24)
GFR, Estimated: >60 mL/min (ref >60)
Glucose, Bld: 126 mg/dL — ABNORMAL HIGH (ref 70–99)
Potassium: 3.2 mmol/L — ABNORMAL LOW (ref 3.5–5.1)
Sodium: 140 mmol/L (ref 135–145)
Total Bilirubin: 1 mg/dL (ref <1.2)
Total Protein: 5.9 g/dL — ABNORMAL LOW (ref 6.5–8.1)

## 2023-03-27 MED ORDER — FAMOTIDINE 20 MG PO TABS
20.0000 mg | ORAL_TABLET | Freq: Once | ORAL | Status: AC
  Administered 2023-03-27: 20 mg via ORAL
  Filled 2023-03-27: qty 1

## 2023-03-27 MED ORDER — DIPHENHYDRAMINE HCL 25 MG PO CAPS
50.0000 mg | ORAL_CAPSULE | Freq: Once | ORAL | Status: AC
Start: 2023-03-27 — End: 2023-03-27
  Administered 2023-03-27: 50 mg via ORAL
  Filled 2023-03-27: qty 2

## 2023-03-27 MED ORDER — DEXAMETHASONE 4 MG PO TABS
20.0000 mg | ORAL_TABLET | Freq: Once | ORAL | Status: AC
Start: 2023-03-27 — End: 2023-03-27
  Administered 2023-03-27: 20 mg via ORAL
  Filled 2023-03-27: qty 5

## 2023-03-27 MED ORDER — DARATUMUMAB-HYALURONIDASE-FIHJ 1800-30000 MG-UT/15ML ~~LOC~~ SOLN
1800.0000 mg | Freq: Once | SUBCUTANEOUS | Status: AC
  Administered 2023-03-27: 1800 mg via SUBCUTANEOUS
  Filled 2023-03-27: qty 15

## 2023-03-27 MED ORDER — ACETAMINOPHEN 325 MG PO TABS
650.0000 mg | ORAL_TABLET | Freq: Once | ORAL | Status: AC
  Administered 2023-03-27: 650 mg via ORAL
  Filled 2023-03-27: qty 2

## 2023-03-27 NOTE — Patient Instructions (Signed)
 [place] [facility]  Discharge Instructions: Thank you for choosing [facility] to provide your oncology and hematology care.   If you have a lab appointment with the Cancer Center, please go directly to the Cancer Center and check in at the registration area.   Wear comfortable clothing and clothing appropriate for easy access to any Portacath or PICC line.   We strive to give you quality time with your provider. You may need to reschedule your appointment if you arrive late (15 or more minutes).  Arriving late affects you and other patients whose appointments are after yours.  Also, if you miss three or more appointments without notifying the office, you may be dismissed from the clinic at the provider's discretion.      For prescription refill requests, have your pharmacy contact our office and allow 72 hours for refills to be completed.    Today you received the following chemotherapy and/or immunotherapy agents: Dara Faspro.       To help prevent nausea and vomiting after your treatment, we encourage you to take your nausea medication as directed.  BELOW ARE SYMPTOMS THAT SHOULD BE REPORTED IMMEDIATELY: *FEVER GREATER THAN 100.4 F (38 C) OR HIGHER *CHILLS OR SWEATING *NAUSEA AND VOMITING THAT IS NOT CONTROLLED WITH YOUR NAUSEA MEDICATION *UNUSUAL SHORTNESS OF BREATH *UNUSUAL BRUISING OR BLEEDING *URINARY PROBLEMS (pain or burning when urinating, or frequent urination) *BOWEL PROBLEMS (unusual diarrhea, constipation, pain near the anus) TENDERNESS IN MOUTH AND THROAT WITH OR WITHOUT PRESENCE OF ULCERS (sore throat, sores in mouth, or a toothache) UNUSUAL RASH, SWELLING OR PAIN  UNUSUAL VAGINAL DISCHARGE OR ITCHING   Items with * indicate a potential emergency and should be followed up as soon as possible or go to the Emergency Department if any problems should occur.  Please show the CHEMOTHERAPY ALERT CARD or  IMMUNOTHERAPY ALERT CARD at check-in to the Emergency Department and triage nurse.  Should you have questions after your visit or need to cancel or reschedule your appointment, please contact [facility] - A [facility]PT OF [facility]  Dept: [phone]  and follow the prompts.  Office hours are 8:00 a.m. to 4:30 p.m. Monday - Friday. Please note that voicemails left after 4:00 p.m. may not be returned until the following business day.  We are closed weekends and major holidays. You have access to a nurse at all times for urgent questions. Please call the main number to the clinic Dept: [phone] and follow the prompts.   For any non-urgent questions, you may also contact your provider using MyChart. We now offer e-Visits for anyone 33 and older to request care online for non-urgent symptoms. For details visit [email or web address].   Also download the MyChart app! Go to the app store, search "MyChart", open the app, select , and log in with your MyChart username and password.

## 2023-03-27 NOTE — Progress Notes (Signed)
Patient seen by Dr. Kale  Vitals are within treatment parameters.  Labs reviewed: and are within treatment parameters.  Per physician team, patient is ready for treatment and there are NO modifications to the treatment plan.  

## 2023-03-27 NOTE — Progress Notes (Signed)
HEMATOLOGY/ONCOLOGY CLINIC NOTE  Date of Service: 03/27/2023  Patient Care Team: Tracey Harries, MD as PCP - General (Family Medicine)  CHIEF COMPLAINTS/PURPOSE OF CONSULTATION:  Evaluation and management of multiple myeloma.  HISTORY OF PRESENTING ILLNESS:  Scott Bass is a wonderful 72 y.o. male who has been referred to [place] by Tracey Harries, MD for evaluation and management of multiple myeloma.  Patient was in ED for symptomatic anemia on 09/08/2022. Patient presented to the ED and was hospitalized on 09/25/2022 for acute renal failure.   Today, he presents in a wheelchair and is accompanied by his wife. He reports that he is intermittently nauseous. He does not take anything to manage nausea. He denies any acid reflux symptoms.  He does have mild abdominal pain. He has had one bowel movement this week and does endorse a cycle of constipation and diarrhea. He reports recent poor p.o intake due to loss of appetite over the last month. He reports a weight loss of 5-6 pounds. He denies any leg swelling, radiation exposure, or neuropathy.  He reports that receiving blood transfusions during his hospitalization did improve energy levels. He was given Oxycodone at the hospital which he has run out of. He currently uses Tylenol for pain management.   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 Patient's wife is also blind. He reports that he is able to manage daily activities with his wife at home.   Patient suffered a fall from a chair after it broke. He reports worsened fatigue which began one month ago. He denies any syncope or lightheadedness/dizziness.  He complains of chest wall pain as well as left hip pain in the groin and laterally. This pain has improved.   His DM2 is fairly well-controlled. He reports that he needs a Metformin refill. He has self-discontinued Jardiance 1-2 months ago as he thought that it would affect blood sugars levels. His blood sugars at home have been  fairly stable with its highest level at 145.   His wife reports that patient has frequent urination with a burning sensation. Patient's wife reports that his confusion has improved by 80%. Patient reports that he is occasionally confused. She reports that patient may need assistance with bedside commode. Patient reports that he hospital has initiated home care services. Patient reports that he does not have a nephrologist.  INTERVAL HISTORY:  Scott Bass is a 72 y.o. male here for continued evaluation and management of multiple myeloma. He is here for his cycle 4 day 15 of treatment. Hold Velcade during this visit.   Patient was last seen by me on 03/13/2023 and he complained of appetite loss, weight loss, diarrhea, occasional nausea, and mild lethargy.   Patient is accompanied by his wife during this visit. Patient notes he has been doing well overall since our last visit. He complains of appetite loss which has caused him to lose weight. He has last 8 lbs since our last visit, from 127 lbs to 119 lbs.   He denies any new infection issues, fever, chills, night sweats, unexpected weight loss, back pain, chest pain, bone pain, or leg swelling.   He has been tolerating his treatment well without any new or severe toxicities.    MEDICAL HISTORY:  Past Medical History:  Diagnosis Date   Arthritis    "lower back" (12/18/2017)   Better eye: moderate vision impairment; lesser eye: blind    GERD (gastroesophageal reflux disease)    Glaucoma, both eyes  High cholesterol    Hypertension    Hypothyroidism    Irregular heartbeat    Legally blind    "both eyes; can see some out of left eye"   OSA (obstructive sleep apnea)    Thyroid disease    Type II diabetes mellitus (HCC)    Vitamin B12 deficiency     SURGICAL HISTORY: Past Surgical History:  Procedure Laterality Date   LEFT HEART CATH AND CORONARY ANGIOGRAPHY N/A 12/18/2017   Procedure: LEFT HEART CATH AND CORONARY ANGIOGRAPHY;   Surgeon: Elder Negus, MD;  Location: [facility];  Service: Cardiovascular;  Laterality: N/A;   NOSE SURGERY     "was crooked; they straightened it out" (12/18/2017)    SOCIAL HISTORY: Social History   Socioeconomic History   Marital status: Married    Spouse name: Not on file   Number of children: 0   Years of education: Not on file   Highest education level: Not on file  Occupational History   Occupation: retired  Tobacco Use   Smoking status: Never   Smokeless tobacco: Never  Vaping Use   Vaping status: Never Used  Substance and Sexual Activity   Alcohol use: Not Currently    Comment: occ   Drug use: Never   Sexual activity: Not Currently  Other Topics Concern   Not on file  Social History Narrative   Not on file   Social Determinants of Health   Financial Resource Strain: Low Risk  (10/10/2022)   Received from [facility], [facility]   Overall Financial Resource Strain (CARDIA)    Difficulty of Paying Living Expenses: Not very hard  Food Insecurity: No Food Insecurity (01/28/2023)   Hunger Vital Sign    Worried About Running Out of Food in the Last Year: Never true    Ran Out of Food in the Last Year: Never true  Recent Concern: Food Insecurity - Food Insecurity Present (12/06/2022)   Hunger Vital Sign    Worried About Running Out of Food in the Last Year: Often true    Ran Out of Food in the Last Year: Often true  Transportation Needs: No Transportation Needs (01/28/2023)   PRAPARE - Administrator, Civil Service (Medical): No    Lack of Transportation (Non-Medical): No  Physical Activity: Insufficiently Active (12/23/2020)   Received from [facility], [facility]   Exercise Vital Sign    Days of Exercise per Week: 2 days    Minutes of Exercise per Session: 10 min  Stress: No Stress Concern Present (12/23/2020)   Received from [facility], [facility] of Occupational Health - Occupational Stress  Questionnaire    Feeling of Stress : Not at all  Social Connections: Unknown (09/16/2021)   Received from [facility], [facility]   Social Network    Social Network: Not on file  Intimate Partner Violence: Not At Risk (01/28/2023)   Humiliation, Afraid, Rape, and Kick questionnaire    Fear of Current or Ex-Partner: No    Emotionally Abused: No    Physically Abused: No    Sexually Abused: No    FAMILY HISTORY: Family History  Problem Relation Age of Onset   Diabetes Mellitus II Mother 57   Diabetes Mellitus II Sister    Breast cancer Sister    Heart failure Sister 56    ALLERGIES:  is allergic to hctz [hydrochlorothiazide].  MEDICATIONS:  Current Outpatient Medications  Medication Sig Dispense Refill  acetaminophen (TYLENOL) 500 MG tablet Take 2 tablets (1,000 mg total) by mouth every 8 (eight) hours as needed for moderate pain.     acyclovir (ZOVIRAX) 400 MG tablet Take 1 tablet (400 mg total) by mouth 2 (two) times daily. (Patient not taking: Reported on 02/27/2023) 60 tablet 5   brimonidine (ALPHAGAN) 0.2 % ophthalmic solution Place 1 drop into both eyes 2 (two) times daily.  0   latanoprost (XALATAN) 0.005 % ophthalmic solution Place 1 drop into both eyes every evening.     levothyroxine (SYNTHROID, LEVOTHROID) 150 MCG tablet Take 150 mcg by mouth daily before breakfast.  0   metFORMIN (GLUCOPHAGE) 1000 MG tablet Take 1 tablet (1,000 mg total) by mouth daily with breakfast.     midodrine (PROAMATINE) 2.5 MG tablet Take 1 tablet (2.5 mg total) by mouth 2 (two) times daily with a meal. (Patient taking differently: Take 2.5 mg by mouth in the morning.) 60 tablet 1   ondansetron (ZOFRAN) 4 MG tablet Take 4 mg by mouth every 8 (eight) hours as needed for nausea or vomiting.     pantoprazole (PROTONIX) 40 MG tablet Take 1 tablet (40 mg total) by mouth daily. (Patient not taking: Reported on 02/27/2023)     potassium chloride SA (KLOR-CON M) 20 MEQ tablet Take 1 tablet (20 mEq  total) by mouth 2 (two) times daily. 30 tablet 0   rosuvastatin (CRESTOR) 10 MG tablet Take 10 mg by mouth at bedtime.     TUMS 500 MG chewable tablet Chew 1-2 tablets by mouth 2 (two) times daily as needed for indigestion or heartburn.     No current facility-administered medications for this visit.    REVIEW OF SYSTEMS:    10 Point review of Systems was done is negative except as noted above.   PHYSICAL EXAMINATION: ECOG PERFORMANCE STATUS: 3 - Symptomatic, >50% confined to bed  Vitals:   03/27/23 1406  BP: 110/66  Pulse: 67  Resp: 16  Temp: 97.8 F (36.6 C)  SpO2: 100%   Filed Weights   03/27/23 1406  Weight: 119 lb 6.4 oz (54.2 kg)   .Body mass index is 19.51 kg/m.  GENERAL:alert, in no acute distress and comfortable SKIN: no acute rashes, no significant lesions EYES: conjunctiva are pink and non-injected, sclera anicteric OROPHARYNX: MMM, no exudates, no oropharyngeal erythema or ulceration NECK: supple, no JVD LYMPH:  no palpable lymphadenopathy in the cervical, axillary or inguinal regions LUNGS: clear to auscultation b/l with normal respiratory effort HEART: regular rate & rhythm ABDOMEN:  normoactive bowel sounds , non tender, not distended. Extremity: no pedal edema PSYCH: alert & oriented x 3 with fluent speech NEURO: no focal motor/sensory deficits   LABORATORY DATA:  I have reviewed the data as listed .    Latest Ref Rng & Units 03/27/2023    1:35 PM 03/20/2023    2:11 PM 03/13/2023    1:21 PM  CBC  WBC 4.0 - 10.5 K/uL 9.2  7.6  5.7   Hemoglobin 13.0 - 17.0 g/dL 9.3  9.2  8.8   Hematocrit 39.0 - 52.0 % 27.2  27.0  25.8   Platelets 150 - 400 K/uL 293  256  277    .    Latest Ref Rng & Units 03/27/2023    1:35 PM 03/20/2023    2:11 PM 03/13/2023    1:21 PM  CMP  Glucose 70 - 99 mg/dL 161  096  045   BUN 8 - 23 mg/dL 14  10  9   Creatinine 0.61 - 1.24 mg/dL 5.78  4.69  6.29   Sodium 135 - 145 mmol/L 140  141  141   Potassium 3.5 - 5.1 mmol/L  3.2  3.5  3.1   Chloride 98 - 111 mmol/L 105  106  107   CO2 22 - 32 mmol/L 23  21  23    Calcium 8.9 - 10.3 mg/dL 9.2  9.1  8.9   Total Protein 6.5 - 8.1 g/dL 5.9  5.9  5.8   Total Bilirubin <1.2 mg/dL 1.0  1.0  1.1   Alkaline Phos 38 - 126 U/L 94  98  103   AST 15 - 41 U/L 12  12  9    ALT 0 - 44 U/L 10  12  6       Bone Marrow Biopsy 09/29/2022:    RADIOGRAPHIC STUDIES: I have personally reviewed the radiological images as listed and agreed with the findings in the report. No results found.  ASSESSMENT & PLAN:   72 year old legally blind gentleman with significant functional limitations and limited social support presented with who was newly diagnosed with multiple myeloma and of May 2024 and was set up for chemo counseling to start daratumumab dexamethasone Velcade and Xgeva and orders were placed and the patient was to get a PET scan but was lost to follow-up.  He had also been referred to social work at that time to help address his barriers of care. He is now admitted with progressive multiple myeloma.  1. Multiple myeloma, IgG kappa, diagnosed May 2024-untreated since patient did not follow-up now admitted with progressive myeloma with severe anemia and progressive renal failure.  CTs 12/04/2022-innumerable lytic lesions, large expansile lesion in the anterior right second rib, multiple pathologic rib fractures, expansile T4 (T3 on CT cervical spine) mass with soft tissue component extending into the central canal and left neural foramen, C7 transverse process fracture  Decadron daily x 4 starting 12/05/2022  Cycle 1 day 1 Velcade 12/07/2022  Decadron daily x 4 starting 12/12/2022  Cycle 1 day 8 Velcade 12/14/2022 2.  Acute/chronic renal failure 3.  Severe anemia 4.  Mild thrombocytopenia 5.  Diabetes 6.  Hypertension 7.  Glaucoma 8.  OSA  PLAN: -Discussed lab results from today, 03/27/2023, in detail with the patient. CBC shows low hemoglobin of 9.3 g/dL with hematocrit of 52.8%.  CMP shows low potassium level of 3.2, decreased total protein level of 5.9, and slightly decreased AST of 12.  -Continue Potassium supplement as potassium levels are low.  -continue to optimize food and water intake to to support strength -continue Dara/Dex. Now off velcade -Patient has been tolerating his treatment well without any new or severe toxicities.  -Discussed that cycle 5 and cycle 6 of his treatments will be every 2 weeks and after cycle 6 it will be once a month.   FOLLOW-UP: Per integrated scheduling  The total time spent in the appointment was 20 minutes* .  All of the patient's questions were answered with apparent satisfaction. The patient knows to call the clinic with any problems, questions or concerns.   Wyvonnia Lora MD MS AAHIVMS [facility] Hematology/Oncology Physician [employer]  .*Total Encounter Time as defined by the Centers for Medicare and Medicaid Services includes, in addition to the face-to-face time of a patient visit (documented in the note above) non-face-to-face time: obtaining and reviewing outside history, ordering and reviewing medications, tests or procedures, care coordination (communications with other health care  professionals or caregivers) and documentation in the medical record.   I,Param Shah,acting as a Neurosurgeon for Wyvonnia Lora, MD.,have documented all relevant documentation on the behalf of Wyvonnia Lora, MD,as directed by  Wyvonnia Lora, MD while in the presence of Wyvonnia Lora, MD.  .I have reviewed the above documentation for accuracy and completeness, and I agree with the above. Johney Maine MD

## 2023-03-30 ENCOUNTER — Other Ambulatory Visit: Payer: Self-pay

## 2023-04-02 ENCOUNTER — Encounter: Payer: Self-pay | Admitting: Hematology

## 2023-04-10 ENCOUNTER — Other Ambulatory Visit: Payer: Self-pay

## 2023-04-10 ENCOUNTER — Inpatient Hospital Stay: Payer: Medicare PPO | Attending: Hematology

## 2023-04-10 ENCOUNTER — Inpatient Hospital Stay: Payer: Medicare PPO | Admitting: Hematology

## 2023-04-10 ENCOUNTER — Inpatient Hospital Stay: Payer: Medicare PPO | Admitting: Dietician

## 2023-04-10 ENCOUNTER — Inpatient Hospital Stay: Payer: Medicare PPO

## 2023-04-10 VITALS — BP 93/55 | HR 114 | Resp 16

## 2023-04-10 DIAGNOSIS — R110 Nausea: Secondary | ICD-10-CM | POA: Insufficient documentation

## 2023-04-10 DIAGNOSIS — E1122 Type 2 diabetes mellitus with diabetic chronic kidney disease: Secondary | ICD-10-CM | POA: Insufficient documentation

## 2023-04-10 DIAGNOSIS — H548 Legal blindness, as defined in USA: Secondary | ICD-10-CM | POA: Insufficient documentation

## 2023-04-10 DIAGNOSIS — R1030 Lower abdominal pain, unspecified: Secondary | ICD-10-CM | POA: Diagnosis not present

## 2023-04-10 DIAGNOSIS — E538 Deficiency of other specified B group vitamins: Secondary | ICD-10-CM | POA: Diagnosis not present

## 2023-04-10 DIAGNOSIS — Z79890 Hormone replacement therapy (postmenopausal): Secondary | ICD-10-CM | POA: Insufficient documentation

## 2023-04-10 DIAGNOSIS — E119 Type 2 diabetes mellitus without complications: Secondary | ICD-10-CM | POA: Insufficient documentation

## 2023-04-10 DIAGNOSIS — D696 Thrombocytopenia, unspecified: Secondary | ICD-10-CM | POA: Insufficient documentation

## 2023-04-10 DIAGNOSIS — H409 Unspecified glaucoma: Secondary | ICD-10-CM | POA: Insufficient documentation

## 2023-04-10 DIAGNOSIS — E785 Hyperlipidemia, unspecified: Secondary | ICD-10-CM | POA: Diagnosis not present

## 2023-04-10 DIAGNOSIS — Z5112 Encounter for antineoplastic immunotherapy: Secondary | ICD-10-CM | POA: Diagnosis present

## 2023-04-10 DIAGNOSIS — I10 Essential (primary) hypertension: Secondary | ICD-10-CM | POA: Diagnosis not present

## 2023-04-10 DIAGNOSIS — R350 Frequency of micturition: Secondary | ICD-10-CM | POA: Diagnosis not present

## 2023-04-10 DIAGNOSIS — E039 Hypothyroidism, unspecified: Secondary | ICD-10-CM | POA: Diagnosis not present

## 2023-04-10 DIAGNOSIS — E43 Unspecified severe protein-calorie malnutrition: Secondary | ICD-10-CM | POA: Diagnosis not present

## 2023-04-10 DIAGNOSIS — R0789 Other chest pain: Secondary | ICD-10-CM | POA: Insufficient documentation

## 2023-04-10 DIAGNOSIS — Z7189 Other specified counseling: Secondary | ICD-10-CM

## 2023-04-10 DIAGNOSIS — R410 Disorientation, unspecified: Secondary | ICD-10-CM | POA: Diagnosis not present

## 2023-04-10 DIAGNOSIS — I129 Hypertensive chronic kidney disease with stage 1 through stage 4 chronic kidney disease, or unspecified chronic kidney disease: Secondary | ICD-10-CM | POA: Diagnosis not present

## 2023-04-10 DIAGNOSIS — C9000 Multiple myeloma not having achieved remission: Secondary | ICD-10-CM

## 2023-04-10 DIAGNOSIS — R630 Anorexia: Secondary | ICD-10-CM | POA: Diagnosis not present

## 2023-04-10 DIAGNOSIS — M25552 Pain in left hip: Secondary | ICD-10-CM | POA: Diagnosis not present

## 2023-04-10 DIAGNOSIS — Z8249 Family history of ischemic heart disease and other diseases of the circulatory system: Secondary | ICD-10-CM | POA: Insufficient documentation

## 2023-04-10 DIAGNOSIS — R197 Diarrhea, unspecified: Secondary | ICD-10-CM | POA: Diagnosis not present

## 2023-04-10 DIAGNOSIS — K219 Gastro-esophageal reflux disease without esophagitis: Secondary | ICD-10-CM | POA: Insufficient documentation

## 2023-04-10 DIAGNOSIS — R634 Abnormal weight loss: Secondary | ICD-10-CM | POA: Diagnosis not present

## 2023-04-10 DIAGNOSIS — D649 Anemia, unspecified: Secondary | ICD-10-CM | POA: Insufficient documentation

## 2023-04-10 DIAGNOSIS — N179 Acute kidney failure, unspecified: Secondary | ICD-10-CM | POA: Insufficient documentation

## 2023-04-10 DIAGNOSIS — N189 Chronic kidney disease, unspecified: Secondary | ICD-10-CM | POA: Diagnosis not present

## 2023-04-10 DIAGNOSIS — Z79620 Long term (current) use of immunosuppressive biologic: Secondary | ICD-10-CM | POA: Insufficient documentation

## 2023-04-10 DIAGNOSIS — Z803 Family history of malignant neoplasm of breast: Secondary | ICD-10-CM | POA: Insufficient documentation

## 2023-04-10 DIAGNOSIS — Z833 Family history of diabetes mellitus: Secondary | ICD-10-CM | POA: Insufficient documentation

## 2023-04-10 DIAGNOSIS — G4733 Obstructive sleep apnea (adult) (pediatric): Secondary | ICD-10-CM | POA: Insufficient documentation

## 2023-04-10 DIAGNOSIS — Z79899 Other long term (current) drug therapy: Secondary | ICD-10-CM | POA: Insufficient documentation

## 2023-04-10 LAB — CBC WITH DIFFERENTIAL (CANCER CENTER ONLY)
Abs Immature Granulocytes: 0.02 10*3/uL (ref 0.00–0.07)
Basophils Absolute: 0 10*3/uL (ref 0.0–0.1)
Basophils Relative: 0 %
Eosinophils Absolute: 0 10*3/uL (ref 0.0–0.5)
Eosinophils Relative: 0 %
HCT: 27.3 % — ABNORMAL LOW (ref 39.0–52.0)
Hemoglobin: 9.1 g/dL — ABNORMAL LOW (ref 13.0–17.0)
Immature Granulocytes: 0 %
Lymphocytes Relative: 18 %
Lymphs Abs: 1.2 10*3/uL (ref 0.7–4.0)
MCH: 31.8 pg (ref 26.0–34.0)
MCHC: 33.3 g/dL (ref 30.0–36.0)
MCV: 95.5 fL (ref 80.0–100.0)
Monocytes Absolute: 0.6 10*3/uL (ref 0.1–1.0)
Monocytes Relative: 9 %
Neutro Abs: 4.7 10*3/uL (ref 1.7–7.7)
Neutrophils Relative %: 73 %
Platelet Count: 271 10*3/uL (ref 150–400)
RBC: 2.86 MIL/uL — ABNORMAL LOW (ref 4.22–5.81)
RDW: 14.9 % (ref 11.5–15.5)
WBC Count: 6.6 10*3/uL (ref 4.0–10.5)
nRBC: 0 % (ref 0.0–0.2)

## 2023-04-10 LAB — CMP (CANCER CENTER ONLY)
ALT: 9 U/L (ref 0–44)
AST: 13 U/L — ABNORMAL LOW (ref 15–41)
Albumin: 3.4 g/dL — ABNORMAL LOW (ref 3.5–5.0)
Alkaline Phosphatase: 84 U/L (ref 38–126)
Anion gap: 15 (ref 5–15)
BUN: 12 mg/dL (ref 8–23)
CO2: 22 mmol/L (ref 22–32)
Calcium: 9.3 mg/dL (ref 8.9–10.3)
Chloride: 104 mmol/L (ref 98–111)
Creatinine: 1.29 mg/dL — ABNORMAL HIGH (ref 0.61–1.24)
GFR, Estimated: 59 mL/min — ABNORMAL LOW (ref >60)
Glucose, Bld: 143 mg/dL — ABNORMAL HIGH (ref 70–99)
Potassium: 3.2 mmol/L — ABNORMAL LOW (ref 3.5–5.1)
Sodium: 141 mmol/L (ref 135–145)
Total Bilirubin: 0.9 mg/dL (ref <1.2)
Total Protein: 5.7 g/dL — ABNORMAL LOW (ref 6.5–8.1)

## 2023-04-10 MED ORDER — ONDANSETRON HCL 4 MG PO TABS: 4.0000 mg | ORAL_TABLET | Freq: Three times a day (TID) | ORAL | 2 refills | Status: AC | PRN

## 2023-04-10 MED ORDER — DIPHENHYDRAMINE HCL 25 MG PO CAPS
50.0000 mg | ORAL_CAPSULE | Freq: Once | ORAL | Status: AC
Start: 2023-04-10 — End: 2023-04-10
  Administered 2023-04-10: 50 mg via ORAL
  Filled 2023-04-10: qty 2

## 2023-04-10 MED ORDER — FAMOTIDINE 20 MG PO TABS
20.0000 mg | ORAL_TABLET | Freq: Once | ORAL | Status: AC
  Administered 2023-04-10: 20 mg via ORAL
  Filled 2023-04-10: qty 1

## 2023-04-10 MED ORDER — ACETAMINOPHEN 325 MG PO TABS
650.0000 mg | ORAL_TABLET | Freq: Once | ORAL | Status: AC
  Administered 2023-04-10: 650 mg via ORAL
  Filled 2023-04-10: qty 2

## 2023-04-10 MED ORDER — DEXAMETHASONE 4 MG PO TABS
20.0000 mg | ORAL_TABLET | Freq: Once | ORAL | Status: AC
Start: 2023-04-10 — End: 2023-04-10
  Administered 2023-04-10: 20 mg via ORAL
  Filled 2023-04-10: qty 5

## 2023-04-10 MED ORDER — DARATUMUMAB-HYALURONIDASE-FIHJ 1800-30000 MG-UT/15ML ~~LOC~~ SOLN
1800.0000 mg | Freq: Once | SUBCUTANEOUS | Status: AC
  Administered 2023-04-10: 1800 mg via SUBCUTANEOUS
  Filled 2023-04-10: qty 15

## 2023-04-10 NOTE — Progress Notes (Signed)
Nutrition Assessment   Reason for Assessment: +MST   ASSESSMENT: 72 year old male with multiple myeloma. He is currently receiving daratumumab + dexamethasone under the care of Dr. Candise Che.   Past medical history includes GERD, legal blindness, glaucoma, hypothyroidism, HLD, HTN, DM2, B12 deficiency   Met with patient and wife in infusion. Patient appears weak and required assistance to step on scale this afternoon. Says he "can't hold anything down." He endorses episodes of vomiting after oral intake. Patient reports no longer tolerating meat or bread. Sometimes he tolerates eggs. The only food that agrees with him is vegetables. Patient is drinking water. He was previously drinking Boost, but that no longer agrees with him. Patient says he is afraid to eat. He is unsure what happened to his antiemetics. Reports unable to find them at home.    Nutrition Focused Physical Exam:   Orbital Region: severe Buccal Region: severe Temple Region: severe Clavicle Bone Region: severe Shoulder and Acromion Bone Region: severe Dorsal Hand: severe Hair: reviewed Eyes: reviewed (legally blind, bilateral glaucoma) Mouth: reviewed   Medications: acyclovir, metformin, zofran, klor-con, crestor, tums, midodrine   Labs: K 3.2, glucose 143, Cr 1.29, albumin 3.4   Anthropometrics:   Height: 5'5.6" Weight: 116.6 lb  UBW: 153 lb 3.5 oz (9/24) BMI: 19.05   NUTRITION DIAGNOSIS: Inadequate oral intake related to cancer and associated treatment side effects as evidenced by nausea with vomiting, 9% wt loss in 4 weeks   MALNUTRITION DIAGNOSIS: Patient meets criteria for severe malnutrition in the context of chronic illness related to multiple myeloma as evidenced by severe muscle/fat depletions, 24% decrease from usual weight in 2 months    INTERVENTION:  Message sent to nurse navigator Vanessa Kick) regarding zofran refill Pt has declined to go to ED for further evaluation Educated on strategies for  nausea, foods to avoid and foods to include Provided Ensure Clear and Boost Breeze samples for pt to try - coupons provided Handouts provided   MONITORING, EVALUATION, GOAL: wt trends, intake   Next Visit: Tuesday December 31 during infusion

## 2023-04-10 NOTE — Progress Notes (Signed)
Per Dr Candise Che, okay to proceed with treatment with HR 114.

## 2023-04-10 NOTE — Patient Instructions (Signed)

## 2023-04-17 ENCOUNTER — Ambulatory Visit: Payer: Medicare PPO

## 2023-04-17 ENCOUNTER — Other Ambulatory Visit: Payer: Medicare PPO

## 2023-04-24 ENCOUNTER — Inpatient Hospital Stay: Payer: Medicare PPO

## 2023-04-24 ENCOUNTER — Inpatient Hospital Stay (HOSPITAL_BASED_OUTPATIENT_CLINIC_OR_DEPARTMENT_OTHER): Payer: Medicare PPO | Admitting: Hematology

## 2023-04-24 VITALS — BP 98/67 | HR 108 | Temp 97.6°F | Resp 29

## 2023-04-24 DIAGNOSIS — C9000 Multiple myeloma not having achieved remission: Secondary | ICD-10-CM

## 2023-04-24 DIAGNOSIS — Z7189 Other specified counseling: Secondary | ICD-10-CM

## 2023-04-24 DIAGNOSIS — Z5112 Encounter for antineoplastic immunotherapy: Secondary | ICD-10-CM | POA: Diagnosis not present

## 2023-04-24 LAB — CBC WITH DIFFERENTIAL (CANCER CENTER ONLY)
Abs Immature Granulocytes: 0.03 10*3/uL (ref 0.00–0.07)
Basophils Absolute: 0 10*3/uL (ref 0.0–0.1)
Basophils Relative: 0 %
Eosinophils Absolute: 0 10*3/uL (ref 0.0–0.5)
Eosinophils Relative: 0 %
HCT: 29.9 % — ABNORMAL LOW (ref 39.0–52.0)
Hemoglobin: 10.2 g/dL — ABNORMAL LOW (ref 13.0–17.0)
Immature Granulocytes: 0 %
Lymphocytes Relative: 12 %
Lymphs Abs: 1 10*3/uL (ref 0.7–4.0)
MCH: 32.3 pg (ref 26.0–34.0)
MCHC: 34.1 g/dL (ref 30.0–36.0)
MCV: 94.6 fL (ref 80.0–100.0)
Monocytes Absolute: 0.7 10*3/uL (ref 0.1–1.0)
Monocytes Relative: 8 %
Neutro Abs: 7.2 10*3/uL (ref 1.7–7.7)
Neutrophils Relative %: 80 %
Platelet Count: 301 10*3/uL (ref 150–400)
RBC: 3.16 MIL/uL — ABNORMAL LOW (ref 4.22–5.81)
RDW: 14.3 % (ref 11.5–15.5)
WBC Count: 9 10*3/uL (ref 4.0–10.5)
nRBC: 0 % (ref 0.0–0.2)

## 2023-04-24 LAB — CMP (CANCER CENTER ONLY)
ALT: 8 U/L (ref 0–44)
AST: 14 U/L — ABNORMAL LOW (ref 15–41)
Albumin: 3.3 g/dL — ABNORMAL LOW (ref 3.5–5.0)
Alkaline Phosphatase: 82 U/L (ref 38–126)
Anion gap: 15 (ref 5–15)
BUN: 11 mg/dL (ref 8–23)
CO2: 21 mmol/L — ABNORMAL LOW (ref 22–32)
Calcium: 9.3 mg/dL (ref 8.9–10.3)
Chloride: 103 mmol/L (ref 98–111)
Creatinine: 1.26 mg/dL — ABNORMAL HIGH (ref 0.61–1.24)
GFR, Estimated: >60 mL/min (ref >60)
Glucose, Bld: 124 mg/dL — ABNORMAL HIGH (ref 70–99)
Potassium: 3.7 mmol/L (ref 3.5–5.1)
Sodium: 139 mmol/L (ref 135–145)
Total Bilirubin: 0.8 mg/dL (ref <1.2)
Total Protein: 5.6 g/dL — ABNORMAL LOW (ref 6.5–8.1)

## 2023-04-24 MED ORDER — DARATUMUMAB-HYALURONIDASE-FIHJ 1800-30000 MG-UT/15ML ~~LOC~~ SOLN
1800.0000 mg | Freq: Once | SUBCUTANEOUS | Status: AC
Start: 2023-04-24 — End: 2023-04-24
  Administered 2023-04-24: 1800 mg via SUBCUTANEOUS
  Filled 2023-04-24: qty 15

## 2023-04-24 MED ORDER — FAMOTIDINE 20 MG PO TABS
20.0000 mg | ORAL_TABLET | Freq: Once | ORAL | Status: AC
  Administered 2023-04-24: 20 mg via ORAL
  Filled 2023-04-24: qty 1

## 2023-04-24 MED ORDER — DEXAMETHASONE 4 MG PO TABS
20.0000 mg | ORAL_TABLET | Freq: Once | ORAL | Status: AC
  Administered 2023-04-24: 20 mg via ORAL
  Filled 2023-04-24: qty 5

## 2023-04-24 MED ORDER — ACETAMINOPHEN 325 MG PO TABS
650.0000 mg | ORAL_TABLET | Freq: Once | ORAL | Status: AC
  Administered 2023-04-24: 650 mg via ORAL
  Filled 2023-04-24: qty 2

## 2023-04-24 MED ORDER — DIPHENHYDRAMINE HCL 25 MG PO CAPS
50.0000 mg | ORAL_CAPSULE | Freq: Once | ORAL | Status: AC
  Administered 2023-04-24: 50 mg via ORAL
  Filled 2023-04-24: qty 2

## 2023-04-24 NOTE — Progress Notes (Signed)
Per Dr Candise Che pt ok for tx today with HR of 108

## 2023-04-24 NOTE — Progress Notes (Signed)
HEMATOLOGY/ONCOLOGY CLINIC NOTE  Date of Service: 04/24/2023  Patient Care Team: Tracey Harries, MD as PCP - General (Family Medicine)  CHIEF COMPLAINTS/PURPOSE OF CONSULTATION:  Evaluation and management of multiple myeloma.  HISTORY OF PRESENTING ILLNESS:  Scott Bass is a wonderful 72 y.o. male who has been referred to [place] by Tracey Harries, MD for evaluation and management of multiple myeloma.  Patient was in ED for symptomatic anemia on 09/08/2022. Patient presented to the ED and was hospitalized on 09/25/2022 for acute renal failure.   Today, he presents in a wheelchair and is accompanied by his wife. He reports that he is intermittently nauseous. He does not take anything to manage nausea. He denies any acid reflux symptoms.  He does have mild abdominal pain. He has had one bowel movement this week and does endorse a cycle of constipation and diarrhea. He reports recent poor p.o intake due to loss of appetite over the last month. He reports a weight loss of 5-6 pounds. He denies any leg swelling, radiation exposure, or neuropathy.  He reports that receiving blood transfusions during his hospitalization did improve energy levels. He was given Oxycodone at the hospital which he has run out of. He currently uses Tylenol for pain management.   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 Patient's wife is also blind. He reports that he is able to manage daily activities with his wife at home.   Patient suffered a fall from a chair after it broke. He reports worsened fatigue which began one month ago. He denies any syncope or lightheadedness/dizziness.  He complains of chest wall pain as well as left hip pain in the groin and laterally. This pain has improved.   His DM2 is fairly well-controlled. He reports that he needs a Metformin refill. He has self-discontinued Jardiance 1-2 months ago as he thought that it would affect blood sugars levels. His blood sugars at home have been  fairly stable with its highest level at 145.   His wife reports that patient has frequent urination with a burning sensation. Patient's wife reports that his confusion has improved by 80%. Patient reports that he is occasionally confused. She reports that patient may need assistance with bedside commode. Patient reports that he hospital has initiated home care services. Patient reports that he does not have a nephrologist.  INTERVAL HISTORY:  Scott Bass is a 72 y.o. male here for continued evaluation and management of multiple myeloma. He is here for his cycle 5 day 15 of treatment.   Patient was last seen by me on 03/27/2023 and he complained of appetite loss and weight loss.  Patient notes he has been doing well overall since our last visit. He complains of nausea when eating certain food. He currently does not cook and orders pre-cooked meals.   He denies any new infection issues, fever, chills, night sweats, unexpected weight loss, back pain, chest pain, abdominal pain, abnormal bowel movement, or leg swelling.  Patient notes that his diarrhea has resolved after discontinuing Metformin.   Patient notes that he does not drink enough water. He drinks around 3-4 16 oz water bottles.    MEDICAL HISTORY:  Past Medical History:  Diagnosis Date   Arthritis    "lower back" (12/18/2017)   Better eye: moderate vision impairment; lesser eye: blind    GERD (gastroesophageal reflux disease)    Glaucoma, both eyes    High cholesterol    Hypertension    Hypothyroidism  Irregular heartbeat    Legally blind    "both eyes; can see some out of left eye"   OSA (obstructive sleep apnea)    Thyroid disease    Type II diabetes mellitus (HCC)    Vitamin B12 deficiency     SURGICAL HISTORY: Past Surgical History:  Procedure Laterality Date   LEFT HEART CATH AND CORONARY ANGIOGRAPHY N/A 12/18/2017   Procedure: LEFT HEART CATH AND CORONARY ANGIOGRAPHY;  Surgeon: Elder Negus, MD;   Location: [facility];  Service: Cardiovascular;  Laterality: N/A;   NOSE SURGERY     "was crooked; they straightened it out" (12/18/2017)    SOCIAL HISTORY: Social History   Socioeconomic History   Marital status: Married    Spouse name: Not on file   Number of children: 0   Years of education: Not on file   Highest education level: Not on file  Occupational History   Occupation: retired  Tobacco Use   Smoking status: Never   Smokeless tobacco: Never  Vaping Use   Vaping status: Never Used  Substance and Sexual Activity   Alcohol use: Not Currently    Comment: occ   Drug use: Never   Sexual activity: Not Currently  Other Topics Concern   Not on file  Social History Narrative   Not on file   Social Drivers of Health   Financial Resource Strain: Low Risk  (10/10/2022)   Received from [facility]   Overall Financial Resource Strain (CARDIA)    Difficulty of Paying Living Expenses: Not very hard  Food Insecurity: No Food Insecurity (01/28/2023)   Hunger Vital Sign    Worried About Running Out of Food in the Last Year: Never true    Ran Out of Food in the Last Year: Never true  Recent Concern: Food Insecurity - Food Insecurity Present (12/06/2022)   Hunger Vital Sign    Worried About Running Out of Food in the Last Year: Often true    Ran Out of Food in the Last Year: Often true  Transportation Needs: No Transportation Needs (01/28/2023)   PRAPARE - Administrator, Civil Service (Medical): No    Lack of Transportation (Non-Medical): No  Physical Activity: Insufficiently Active (12/23/2020)   Received from [facility]   Exercise Vital Sign    Days of Exercise per Week: 2 days    Minutes of Exercise per Session: 10 min  Stress: No Stress Concern Present (12/23/2020)   Received from [facility] - Occupational Stress Questionnaire    Feeling of Stress : Not at all   Social Connections: Unknown (09/16/2021)   Received from [facility]   Social Network    Social Network: Not on file  Intimate Partner Violence: Not At Risk (01/28/2023)   Humiliation, Afraid, Rape, and Kick questionnaire    Fear of Current or Ex-Partner: No    Emotionally Abused: No    Physically Abused: No    Sexually Abused: No    FAMILY HISTORY: Family History  Problem Relation Age of Onset   Diabetes Mellitus II Mother 19   Diabetes Mellitus II Sister    Breast cancer Sister    Heart failure Sister 3    ALLERGIES:  is allergic to hctz [hydrochlorothiazide].  MEDICATIONS:  Current Outpatient Medications  Medication Sig Dispense Refill   acetaminophen (TYLENOL) 500 MG tablet Take 2 tablets (1,000 mg total) by  mouth every 8 (eight) hours as needed for moderate pain.     acyclovir (ZOVIRAX) 400 MG tablet Take 1 tablet (400 mg total) by mouth 2 (two) times daily. (Patient not taking: Reported on 02/27/2023) 60 tablet 5   brimonidine (ALPHAGAN) 0.2 % ophthalmic solution Place 1 drop into both eyes 2 (two) times daily.  0   latanoprost (XALATAN) 0.005 % ophthalmic solution Place 1 drop into both eyes every evening.     levothyroxine (SYNTHROID, LEVOTHROID) 150 MCG tablet Take 150 mcg by mouth daily before breakfast.  0   metFORMIN (GLUCOPHAGE) 1000 MG tablet Take 1 tablet (1,000 mg total) by mouth daily with breakfast.     midodrine (PROAMATINE) 2.5 MG tablet Take 1 tablet (2.5 mg total) by mouth 2 (two) times daily with a meal. (Patient taking differently: Take 2.5 mg by mouth in the morning.) 60 tablet 1   ondansetron (ZOFRAN) 4 MG tablet Take 1 tablet (4 mg total) by mouth every 8 (eight) hours as needed for nausea or vomiting. 20 tablet 2   pantoprazole (PROTONIX) 40 MG tablet Take 1 tablet (40 mg total) by mouth daily. (Patient not taking: Reported on 02/27/2023)     potassium chloride SA (KLOR-CON M) 20 MEQ tablet Take 1 tablet (20 mEq total) by mouth 2 (two)  times daily. 30 tablet 0   rosuvastatin (CRESTOR) 10 MG tablet Take 10 mg by mouth at bedtime.     TUMS 500 MG chewable tablet Chew 1-2 tablets by mouth 2 (two) times daily as needed for indigestion or heartburn.     No current facility-administered medications for this visit.    REVIEW OF SYSTEMS:    10 Point review of Systems was done is negative except as noted above.   PHYSICAL EXAMINATION: ECOG PERFORMANCE STATUS: 3 - Symptomatic, >50% confined to bed  GENERAL:alert, in no acute distress and comfortable SKIN: no acute rashes, no significant lesions EYES: conjunctiva are pink and non-injected, sclera anicteric OROPHARYNX: MMM, no exudates, no oropharyngeal erythema or ulceration NECK: supple, no JVD LYMPH:  no palpable lymphadenopathy in the cervical, axillary or inguinal regions LUNGS: clear to auscultation b/l with normal respiratory effort HEART: regular rate & rhythm ABDOMEN:  normoactive bowel sounds , non tender, not distended. Extremity: no pedal edema PSYCH: alert & oriented x 3 with fluent speech NEURO: no focal motor/sensory deficits   LABORATORY DATA:  I have reviewed the data as listed .    Latest Ref Rng & Units 04/24/2023   12:58 PM 04/10/2023    1:40 PM 03/27/2023    1:35 PM  CBC  WBC 4.0 - 10.5 K/uL 9.0  6.6  9.2   Hemoglobin 13.0 - 17.0 g/dL 40.1  9.1  9.3   Hematocrit 39.0 - 52.0 % 29.9  27.3  27.2   Platelets 150 - 400 K/uL 301  271  293    .    Latest Ref Rng & Units 04/24/2023   12:58 PM 04/10/2023    1:40 PM 03/27/2023    1:35 PM  CMP  Glucose 70 - 99 mg/dL 027  253  664   BUN 8 - 23 mg/dL 11  12  14    Creatinine 0.61 - 1.24 mg/dL 4.03  4.74  2.59   Sodium 135 - 145 mmol/L 139  141  140   Potassium 3.5 - 5.1 mmol/L 3.7  3.2  3.2   Chloride 98 - 111 mmol/L 103  104  105   CO2 22 - 32  mmol/L 21  22  23    Calcium 8.9 - 10.3 mg/dL 9.3  9.3  9.2   Total Protein 6.5 - 8.1 g/dL 5.6  5.7  5.9   Total Bilirubin <1.2 mg/dL 0.8  0.9  1.0    Alkaline Phos 38 - 126 U/L 82  84  94   AST 15 - 41 U/L 14  13  12    ALT 0 - 44 U/L 8  9  10       Bone Marrow Biopsy 09/29/2022:    RADIOGRAPHIC STUDIES: I have personally reviewed the radiological images as listed and agreed with the findings in the report. No results found.  ASSESSMENT & PLAN:   71 year old legally blind gentleman with significant functional limitations and limited social support presented with who was newly diagnosed with multiple myeloma and of May 2024 and was set up for chemo counseling to start daratumumab dexamethasone Velcade and Xgeva and orders were placed and the patient was to get a PET scan but was lost to follow-up.  He had also been referred to social work at that time to help address his barriers of care. He is now admitted with progressive multiple myeloma.  1. Multiple myeloma, IgG kappa, diagnosed May 2024-untreated since patient did not follow-up now admitted with progressive myeloma with severe anemia and progressive renal failure.  CTs 12/04/2022-innumerable lytic lesions, large expansile lesion in the anterior right second rib, multiple pathologic rib fractures, expansile T4 (T3 on CT cervical spine) mass with soft tissue component extending into the central canal and left neural foramen, C7 transverse process fracture  Decadron daily x 4 starting 12/05/2022  Cycle 1 day 1 Velcade 12/07/2022  Decadron daily x 4 starting 12/12/2022  Cycle 1 day 8 Velcade 12/14/2022 2.  Acute/chronic renal failure 3.  Severe anemia 4.  Mild thrombocytopenia 5.  Diabetes 6.  Hypertension 7.  Glaucoma 8.  OSA  PLAN: -continue to optimize food and water intake to to support strength -continue Dara/Dex. Now off velcade -Patient has been tolerating his treatment well without any new or severe toxicities.  -Discussed that cycle 5 and cycle 6 of his treatments will be every 2 weeks and after cycle 6 it will be once a month.   FOLLOW-UP: Plz schedule C6 and C7 per  integrated scheduling MD visit every 4 weeks  The total time spent in the appointment was 30 minutes* .  All of the patient's questions were answered with apparent satisfaction. The patient knows to call the clinic with any problems, questions or concerns.   Wyvonnia Lora MD MS AAHIVMS [facility] Hematology/Oncology Physician [facility]  .*Total Encounter Time as defined by the Centers for Medicare and Medicaid Services includes, in addition to the face-to-face time of a patient visit (documented in the note above) non-face-to-face time: obtaining and reviewing outside history, ordering and reviewing medications, tests or procedures, care coordination (communications with other health care professionals or caregivers) and documentation in the medical record.   I,Param Shah,acting as a Neurosurgeon for Wyvonnia Lora, MD.,have documented all relevant documentation on the behalf of Wyvonnia Lora, MD,as directed by  Wyvonnia Lora, MD while in the presence of Wyvonnia Lora, MD.  .I have reviewed the above documentation for accuracy and completeness, and I agree with the above. Johney Maine MD

## 2023-04-24 NOTE — Patient Instructions (Signed)

## 2023-04-27 ENCOUNTER — Other Ambulatory Visit: Payer: Self-pay

## 2023-05-01 ENCOUNTER — Encounter: Payer: Self-pay | Admitting: Hematology

## 2023-05-08 ENCOUNTER — Inpatient Hospital Stay: Payer: Medicare PPO

## 2023-05-08 ENCOUNTER — Other Ambulatory Visit: Payer: Self-pay

## 2023-05-08 ENCOUNTER — Inpatient Hospital Stay: Payer: Medicare PPO | Admitting: Dietician

## 2023-05-08 VITALS — BP 98/55 | HR 108 | Temp 98.1°F | Resp 20 | Wt 105.8 lb

## 2023-05-08 DIAGNOSIS — C9000 Multiple myeloma not having achieved remission: Secondary | ICD-10-CM

## 2023-05-08 DIAGNOSIS — Z7189 Other specified counseling: Secondary | ICD-10-CM

## 2023-05-08 DIAGNOSIS — Z5112 Encounter for antineoplastic immunotherapy: Secondary | ICD-10-CM | POA: Diagnosis not present

## 2023-05-08 LAB — CBC WITH DIFFERENTIAL (CANCER CENTER ONLY)
Abs Immature Granulocytes: 0.02 10*3/uL (ref 0.00–0.07)
Basophils Absolute: 0 10*3/uL (ref 0.0–0.1)
Basophils Relative: 0 %
Eosinophils Absolute: 0 10*3/uL (ref 0.0–0.5)
Eosinophils Relative: 0 %
HCT: 28.7 % — ABNORMAL LOW (ref 39.0–52.0)
Hemoglobin: 9.9 g/dL — ABNORMAL LOW (ref 13.0–17.0)
Immature Granulocytes: 0 %
Lymphocytes Relative: 13 %
Lymphs Abs: 0.9 10*3/uL (ref 0.7–4.0)
MCH: 31.7 pg (ref 26.0–34.0)
MCHC: 34.5 g/dL (ref 30.0–36.0)
MCV: 92 fL (ref 80.0–100.0)
Monocytes Absolute: 0.6 10*3/uL (ref 0.1–1.0)
Monocytes Relative: 8 %
Neutro Abs: 5.6 10*3/uL (ref 1.7–7.7)
Neutrophils Relative %: 79 %
Platelet Count: 303 10*3/uL (ref 150–400)
RBC: 3.12 MIL/uL — ABNORMAL LOW (ref 4.22–5.81)
RDW: 14.8 % (ref 11.5–15.5)
WBC Count: 7.1 10*3/uL (ref 4.0–10.5)
nRBC: 0 % (ref 0.0–0.2)

## 2023-05-08 LAB — CMP (CANCER CENTER ONLY)
ALT: 10 U/L (ref 0–44)
AST: 11 U/L — ABNORMAL LOW (ref 15–41)
Albumin: 3.4 g/dL — ABNORMAL LOW (ref 3.5–5.0)
Alkaline Phosphatase: 82 U/L (ref 38–126)
Anion gap: 17 — ABNORMAL HIGH (ref 5–15)
BUN: 13 mg/dL (ref 8–23)
CO2: 22 mmol/L (ref 22–32)
Calcium: 9.4 mg/dL (ref 8.9–10.3)
Chloride: 103 mmol/L (ref 98–111)
Creatinine: 1.4 mg/dL — ABNORMAL HIGH (ref 0.61–1.24)
GFR, Estimated: 53 mL/min — ABNORMAL LOW (ref >60)
Glucose, Bld: 183 mg/dL — ABNORMAL HIGH (ref 70–99)
Potassium: 3.5 mmol/L (ref 3.5–5.1)
Sodium: 142 mmol/L (ref 135–145)
Total Bilirubin: 1 mg/dL (ref 0.0–1.2)
Total Protein: 5.9 g/dL — ABNORMAL LOW (ref 6.5–8.1)

## 2023-05-08 MED ORDER — DEXAMETHASONE 4 MG PO TABS
20.0000 mg | ORAL_TABLET | Freq: Once | ORAL | Status: AC
  Administered 2023-05-08: 20 mg via ORAL
  Filled 2023-05-08: qty 5

## 2023-05-08 MED ORDER — DARATUMUMAB-HYALURONIDASE-FIHJ 1800-30000 MG-UT/15ML ~~LOC~~ SOLN
1800.0000 mg | Freq: Once | SUBCUTANEOUS | Status: AC
  Administered 2023-05-08: 1800 mg via SUBCUTANEOUS
  Filled 2023-05-08: qty 15

## 2023-05-08 MED ORDER — FAMOTIDINE 20 MG PO TABS
20.0000 mg | ORAL_TABLET | Freq: Once | ORAL | Status: AC
Start: 2023-05-08 — End: 2023-05-08
  Administered 2023-05-08: 20 mg via ORAL
  Filled 2023-05-08: qty 1

## 2023-05-08 MED ORDER — ACETAMINOPHEN 325 MG PO TABS
650.0000 mg | ORAL_TABLET | Freq: Once | ORAL | Status: AC
Start: 2023-05-08 — End: 2023-05-08
  Administered 2023-05-08: 650 mg via ORAL
  Filled 2023-05-08: qty 2

## 2023-05-08 MED ORDER — DIPHENHYDRAMINE HCL 25 MG PO CAPS
50.0000 mg | ORAL_CAPSULE | Freq: Once | ORAL | Status: AC
  Administered 2023-05-08: 50 mg via ORAL
  Filled 2023-05-08: qty 2

## 2023-05-08 NOTE — Progress Notes (Signed)
 Nutrition Follow-up:  Patient with multiple myeloma. He is receiving daratumumab  + dexamethasone  under the care of Dr. Onesimo.   Briefly met with patient in infusion. He is in the bed today secondary to possible ongoing bed bugs. Wife is present at visit. Patient hold emesis bag and reports persistent nausea. He reports taking antiemetic without relief. Patient tolerated applesauce for breakfast. Patient declined RD offer for sandwich, chips, yogurt, Ensure. States he wishes to find something that stays down, however he denies vomiting.    Medications: reviewed   Labs: in process at this time  Anthropometrics: Pt 105 lb 13.1 oz today decreased 9% in 4 weeks. Wts have decreased 30% in the last 16 weeks - this is severe  12/3 - 116 lb 9.6 oz  11/5 - 127 lb 6.4 oz  10/1 - 141 lb 4.8 oz  9/10 - 150 lb 14.4 oz    NUTRITION DIAGNOSIS: Inadequate oral intake continues    MALNUTRITION DIAGNOSIS: Severe malnutrition continues    INTERVENTION:  Encouraged taking antiemetics as prescribed Reviewed foods best tolerated with nausea Consider appetite stimulant     MONITORING, EVALUATION, GOAL: wt trends, intake, goals of care    NEXT VISIT: Tuesday January 28 during infusion

## 2023-05-10 LAB — KAPPA/LAMBDA LIGHT CHAINS
Kappa free light chain: 15.5 mg/L (ref 3.3–19.4)
Kappa, lambda light chain ratio: 1.37 (ref 0.26–1.65)
Lambda free light chains: 11.3 mg/L (ref 5.7–26.3)

## 2023-05-18 LAB — MULTIPLE MYELOMA PANEL, SERUM
Albumin SerPl Elph-Mcnc: 2.9 g/dL (ref 2.9–4.4)
Albumin/Glob SerPl: 1.4 (ref 0.7–1.7)
Alpha 1: 0.2 g/dL (ref 0.0–0.4)
Alpha2 Glob SerPl Elph-Mcnc: 0.9 g/dL (ref 0.4–1.0)
B-Globulin SerPl Elph-Mcnc: 0.6 g/dL — ABNORMAL LOW (ref 0.7–1.3)
Gamma Glob SerPl Elph-Mcnc: 0.5 g/dL (ref 0.4–1.8)
Globulin, Total: 2.2 g/dL (ref 2.2–3.9)
IgA: 84 mg/dL (ref 61–437)
IgG (Immunoglobin G), Serum: 672 mg/dL (ref 603–1613)
IgM (Immunoglobulin M), Srm: 28 mg/dL (ref 15–143)
Total Protein ELP: 5.1 g/dL — ABNORMAL LOW (ref 6.0–8.5)

## 2023-05-22 ENCOUNTER — Inpatient Hospital Stay: Payer: Medicare PPO | Attending: Hematology

## 2023-05-22 ENCOUNTER — Inpatient Hospital Stay: Payer: Medicare PPO

## 2023-05-22 ENCOUNTER — Inpatient Hospital Stay (HOSPITAL_BASED_OUTPATIENT_CLINIC_OR_DEPARTMENT_OTHER): Payer: Medicare PPO | Admitting: Hematology

## 2023-05-22 ENCOUNTER — Other Ambulatory Visit: Payer: Self-pay

## 2023-05-22 VITALS — BP 130/81 | HR 100 | Temp 97.7°F | Resp 19

## 2023-05-22 DIAGNOSIS — R110 Nausea: Secondary | ICD-10-CM | POA: Diagnosis not present

## 2023-05-22 DIAGNOSIS — Z5112 Encounter for antineoplastic immunotherapy: Secondary | ICD-10-CM

## 2023-05-22 DIAGNOSIS — E1122 Type 2 diabetes mellitus with diabetic chronic kidney disease: Secondary | ICD-10-CM | POA: Diagnosis not present

## 2023-05-22 DIAGNOSIS — Z803 Family history of malignant neoplasm of breast: Secondary | ICD-10-CM | POA: Insufficient documentation

## 2023-05-22 DIAGNOSIS — K5900 Constipation, unspecified: Secondary | ICD-10-CM | POA: Insufficient documentation

## 2023-05-22 DIAGNOSIS — E43 Unspecified severe protein-calorie malnutrition: Secondary | ICD-10-CM | POA: Diagnosis not present

## 2023-05-22 DIAGNOSIS — Z8249 Family history of ischemic heart disease and other diseases of the circulatory system: Secondary | ICD-10-CM | POA: Insufficient documentation

## 2023-05-22 DIAGNOSIS — R410 Disorientation, unspecified: Secondary | ICD-10-CM | POA: Insufficient documentation

## 2023-05-22 DIAGNOSIS — D649 Anemia, unspecified: Secondary | ICD-10-CM | POA: Diagnosis not present

## 2023-05-22 DIAGNOSIS — N189 Chronic kidney disease, unspecified: Secondary | ICD-10-CM

## 2023-05-22 DIAGNOSIS — C9000 Multiple myeloma not having achieved remission: Secondary | ICD-10-CM | POA: Diagnosis not present

## 2023-05-22 DIAGNOSIS — D696 Thrombocytopenia, unspecified: Secondary | ICD-10-CM | POA: Diagnosis not present

## 2023-05-22 DIAGNOSIS — M25552 Pain in left hip: Secondary | ICD-10-CM | POA: Insufficient documentation

## 2023-05-22 DIAGNOSIS — Z833 Family history of diabetes mellitus: Secondary | ICD-10-CM | POA: Diagnosis not present

## 2023-05-22 DIAGNOSIS — R1030 Lower abdominal pain, unspecified: Secondary | ICD-10-CM | POA: Diagnosis not present

## 2023-05-22 DIAGNOSIS — Z5111 Encounter for antineoplastic chemotherapy: Secondary | ICD-10-CM

## 2023-05-22 DIAGNOSIS — H548 Legal blindness, as defined in USA: Secondary | ICD-10-CM | POA: Diagnosis not present

## 2023-05-22 DIAGNOSIS — Z79899 Other long term (current) drug therapy: Secondary | ICD-10-CM | POA: Insufficient documentation

## 2023-05-22 DIAGNOSIS — R350 Frequency of micturition: Secondary | ICD-10-CM | POA: Insufficient documentation

## 2023-05-22 DIAGNOSIS — R0789 Other chest pain: Secondary | ICD-10-CM | POA: Insufficient documentation

## 2023-05-22 DIAGNOSIS — Z7189 Other specified counseling: Secondary | ICD-10-CM

## 2023-05-22 DIAGNOSIS — I129 Hypertensive chronic kidney disease with stage 1 through stage 4 chronic kidney disease, or unspecified chronic kidney disease: Secondary | ICD-10-CM | POA: Diagnosis not present

## 2023-05-22 DIAGNOSIS — R634 Abnormal weight loss: Secondary | ICD-10-CM | POA: Insufficient documentation

## 2023-05-22 LAB — CBC WITH DIFFERENTIAL (CANCER CENTER ONLY)
Abs Immature Granulocytes: 0.04 10*3/uL (ref 0.00–0.07)
Basophils Absolute: 0 10*3/uL (ref 0.0–0.1)
Basophils Relative: 0 %
Eosinophils Absolute: 0 10*3/uL (ref 0.0–0.5)
Eosinophils Relative: 0 %
HCT: 30.1 % — ABNORMAL LOW (ref 39.0–52.0)
Hemoglobin: 10.5 g/dL — ABNORMAL LOW (ref 13.0–17.0)
Immature Granulocytes: 1 %
Lymphocytes Relative: 14 %
Lymphs Abs: 1.2 10*3/uL (ref 0.7–4.0)
MCH: 32.1 pg (ref 26.0–34.0)
MCHC: 34.9 g/dL (ref 30.0–36.0)
MCV: 92 fL (ref 80.0–100.0)
Monocytes Absolute: 0.6 10*3/uL (ref 0.1–1.0)
Monocytes Relative: 7 %
Neutro Abs: 6.3 10*3/uL (ref 1.7–7.7)
Neutrophils Relative %: 78 %
Platelet Count: 320 10*3/uL (ref 150–400)
RBC: 3.27 MIL/uL — ABNORMAL LOW (ref 4.22–5.81)
RDW: 15.5 % (ref 11.5–15.5)
WBC Count: 8.2 10*3/uL (ref 4.0–10.5)
nRBC: 0.4 % — ABNORMAL HIGH (ref 0.0–0.2)

## 2023-05-22 LAB — CMP (CANCER CENTER ONLY)
ALT: 10 U/L (ref 0–44)
AST: 13 U/L — ABNORMAL LOW (ref 15–41)
Albumin: 3.6 g/dL (ref 3.5–5.0)
Alkaline Phosphatase: 82 U/L (ref 38–126)
Anion gap: 16 — ABNORMAL HIGH (ref 5–15)
BUN: 17 mg/dL (ref 8–23)
CO2: 22 mmol/L (ref 22–32)
Calcium: 9.6 mg/dL (ref 8.9–10.3)
Chloride: 102 mmol/L (ref 98–111)
Creatinine: 1.32 mg/dL — ABNORMAL HIGH (ref 0.61–1.24)
GFR, Estimated: 57 mL/min — ABNORMAL LOW (ref >60)
Glucose, Bld: 155 mg/dL — ABNORMAL HIGH (ref 70–99)
Potassium: 3.8 mmol/L (ref 3.5–5.1)
Sodium: 140 mmol/L (ref 135–145)
Total Bilirubin: 1.2 mg/dL (ref 0.0–1.2)
Total Protein: 6.1 g/dL — ABNORMAL LOW (ref 6.5–8.1)

## 2023-05-22 MED ORDER — ACETAMINOPHEN 325 MG PO TABS
650.0000 mg | ORAL_TABLET | Freq: Once | ORAL | Status: AC
  Administered 2023-05-22: 650 mg via ORAL
  Filled 2023-05-22: qty 2

## 2023-05-22 MED ORDER — FAMOTIDINE 20 MG PO TABS
20.0000 mg | ORAL_TABLET | Freq: Once | ORAL | Status: AC
  Administered 2023-05-22: 20 mg via ORAL
  Filled 2023-05-22: qty 1

## 2023-05-22 MED ORDER — DIPHENHYDRAMINE HCL 25 MG PO CAPS
50.0000 mg | ORAL_CAPSULE | Freq: Once | ORAL | Status: AC
  Administered 2023-05-22: 50 mg via ORAL
  Filled 2023-05-22: qty 2

## 2023-05-22 MED ORDER — DEXAMETHASONE 4 MG PO TABS
20.0000 mg | ORAL_TABLET | Freq: Once | ORAL | Status: AC
Start: 2023-05-22 — End: 2023-05-22
  Administered 2023-05-22: 20 mg via ORAL
  Filled 2023-05-22: qty 5

## 2023-05-22 MED ORDER — DARATUMUMAB-HYALURONIDASE-FIHJ 1800-30000 MG-UT/15ML ~~LOC~~ SOLN
1800.0000 mg | Freq: Once | SUBCUTANEOUS | Status: AC
Start: 2023-05-22 — End: 2023-05-22
  Administered 2023-05-22: 1800 mg via SUBCUTANEOUS
  Filled 2023-05-22: qty 15

## 2023-05-22 NOTE — Patient Instructions (Signed)

## 2023-05-22 NOTE — Progress Notes (Signed)
 HEMATOLOGY/ONCOLOGY CLINIC NOTE  Date of Service: 05/22/2023  Patient Care Team: Pura Lenis, MD as PCP - General (Family Medicine)  CHIEF COMPLAINTS/PURPOSE OF CONSULTATION:  Evaluation and management of multiple myeloma.  HISTORY OF PRESENTING ILLNESS:  Scott Bass is a wonderful 73 y.o. male who has been referred to us  by Pura Lenis, MD for evaluation and management of multiple myeloma.  Patient was in ED for symptomatic anemia on 09/08/2022. Patient presented to the ED and was hospitalized on 09/25/2022 for acute renal failure.   Today, he presents in a wheelchair and is accompanied by his wife. He reports that he is intermittently nauseous. He does not take anything to manage nausea. He denies any acid reflux symptoms.  He does have mild abdominal pain. He has had one bowel movement this week and does endorse a cycle of constipation and diarrhea. He reports recent poor p.o intake due to loss of appetite over the last month. He reports a weight loss of 5-6 pounds. He denies any leg swelling, radiation exposure, or neuropathy.  He reports that receiving blood transfusions during his hospitalization did improve energy levels. He was given Oxycodone  at the hospital which he has run out of. He currently uses Tylenol  for pain management.   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 Patient's wife is also blind. He reports that he is able to manage daily activities with his wife at home.   Patient suffered a fall from a chair after it broke. He reports worsened fatigue which began one month ago. He denies any syncope or lightheadedness/dizziness.  He complains of chest wall pain as well as left hip pain in the groin and laterally. This pain has improved.   His DM2 is fairly well-controlled. He reports that he needs a Metformin  refill. He has self-discontinued Jardiance  1-2 months ago as he thought that it would affect blood sugars levels. His blood sugars at home have been  fairly stable with its highest level at 145.   His wife reports that patient has frequent urination with a burning sensation. Patient's wife reports that his confusion has improved by 80%. Patient reports that he is occasionally confused. She reports that patient may need assistance with bedside commode. Patient reports that he hospital has initiated home care services. Patient reports that he does not have a nephrologist.  INTERVAL HISTORY:  Scott Bass is a 73 y.o. male here for continued evaluation and management of multiple myeloma. He is here for his cycle 6 day 15 of treatment.   Patient was last seen by me on 04/24/2023 and he complained of nausea and dehydration.   Patient is unaccompanied during this visit. Patient notes he has been doing fairly well since our last visit. He complains of abdominal pain, constipation, and weight loss. He notes that he has been eating well, but struggles to eat certain food. He complains of nausea when eating certain food   He denies any new infection issues, fever, chills, night sweats, unexpected weight loss, back pain, chest pain, abdominal pain, abnormal bowel movement, or leg swelling.  MEDICAL HISTORY:  Past Medical History:  Diagnosis Date   Arthritis    lower back (12/18/2017)   Better eye: moderate vision impairment; lesser eye: blind    GERD (gastroesophageal reflux disease)    Glaucoma, both eyes    High cholesterol    Hypertension    Hypothyroidism    Irregular heartbeat    Legally blind    both eyes;  can see some out of left eye   OSA (obstructive sleep apnea)    Thyroid disease    Type II diabetes mellitus (HCC)    Vitamin B12 deficiency     SURGICAL HISTORY: Past Surgical History:  Procedure Laterality Date   LEFT HEART CATH AND CORONARY ANGIOGRAPHY N/A 12/18/2017   Procedure: LEFT HEART CATH AND CORONARY ANGIOGRAPHY;  Surgeon: Elmira Newman PARAS, MD;  Location: [facility];  Service: Cardiovascular;   Laterality: N/A;   NOSE SURGERY     was crooked; they straightened it out (12/18/2017)    SOCIAL HISTORY: Social History   Socioeconomic History   Marital status: Married    Spouse name: Not on file   Number of children: 0   Years of education: Not on file   Highest education level: Not on file  Occupational History   Occupation: retired  Tobacco Use   Smoking status: Never   Smokeless tobacco: Never  Vaping Use   Vaping status: Never Used  Substance and Sexual Activity   Alcohol use: Not Currently    Comment: occ   Drug use: Never   Sexual activity: Not Currently  Other Topics Concern   Not on file  Social History Narrative   Not on file   Social Drivers of Health   Financial Resource Strain: Low Risk  (10/10/2022)   Received from [facility], [facility]   Overall Financial Resource Strain (CARDIA)    Difficulty of Paying Living Expenses: Not very hard  Food Insecurity: No Food Insecurity (01/28/2023)   Hunger Vital Sign    Worried About Running Out of Food in the Last Year: Never true    Ran Out of Food in the Last Year: Never true  Recent Concern: Food Insecurity - Food Insecurity Present (12/06/2022)   Hunger Vital Sign    Worried About Running Out of Food in the Last Year: Often true    Ran Out of Food in the Last Year: Often true  Transportation Needs: No Transportation Needs (01/28/2023)   PRAPARE - Administrator, Civil Service (Medical): No    Lack of Transportation (Non-Medical): No  Physical Activity: Insufficiently Active (12/23/2020)   Received from [facility], [facility]   Exercise Vital Sign    Days of Exercise per Week: 2 days    Minutes of Exercise per Session: 10 min  Stress: No Stress Concern Present (12/23/2020)   Received from [facility] of Occupational Health - Occupational Stress Questionnaire    Feeling of Stress : Not at all  Social Connections: Unknown (09/16/2021)   Received from  [facility], [facility]   Social Network    Social Network: Not on file  Intimate Partner Violence: Not At Risk (01/28/2023)   Humiliation, Afraid, Rape, and Kick questionnaire    Fear of Current or Ex-Partner: No    Emotionally Abused: No    Physically Abused: No    Sexually Abused: No    FAMILY HISTORY: Family History  Problem Relation Age of Onset   Diabetes Mellitus II Mother 105   Diabetes Mellitus II Sister    Breast cancer Sister    Heart failure Sister 42    ALLERGIES:  is allergic to hctz [hydrochlorothiazide].  MEDICATIONS:  Current Outpatient Medications  Medication Sig Dispense Refill   acetaminophen  (TYLENOL ) 500 MG tablet Take 2 tablets (1,000 mg total) by mouth every 8 (eight) hours as needed for moderate pain.  acyclovir  (ZOVIRAX ) 400 MG tablet Take 1 tablet (400 mg total) by mouth 2 (two) times daily. (Patient not taking: Reported on 02/27/2023) 60 tablet 5   brimonidine  (ALPHAGAN ) 0.2 % ophthalmic solution Place 1 drop into both eyes 2 (two) times daily.  0   latanoprost  (XALATAN ) 0.005 % ophthalmic solution Place 1 drop into both eyes every evening.     levothyroxine  (SYNTHROID , LEVOTHROID) 150 MCG tablet Take 150 mcg by mouth daily before breakfast.  0   metFORMIN  (GLUCOPHAGE ) 1000 MG tablet Take 1 tablet (1,000 mg total) by mouth daily with breakfast.     midodrine  (PROAMATINE ) 2.5 MG tablet Take 1 tablet (2.5 mg total) by mouth 2 (two) times daily with a meal. (Patient taking differently: Take 2.5 mg by mouth in the morning.) 60 tablet 1   ondansetron  (ZOFRAN ) 4 MG tablet Take 1 tablet (4 mg total) by mouth every 8 (eight) hours as needed for nausea or vomiting. 20 tablet 2   pantoprazole  (PROTONIX ) 40 MG tablet Take 1 tablet (40 mg total) by mouth daily. (Patient not taking: Reported on 02/27/2023)     potassium chloride  SA (KLOR-CON  M) 20 MEQ tablet Take 1 tablet (20 mEq total) by mouth 2 (two) times daily. 30 tablet 0   rosuvastatin  (CRESTOR ) 10 MG  tablet Take 10 mg by mouth at bedtime.     TUMS 500 MG chewable tablet Chew 1-2 tablets by mouth 2 (two) times daily as needed for indigestion or heartburn.     No current facility-administered medications for this visit.    REVIEW OF SYSTEMS:    10 Point review of Systems was done is negative except as noted above.   PHYSICAL EXAMINATION: ECOG PERFORMANCE STATUS: 3 - Symptomatic, >50% confined to bed .VSS GENERAL:alert, in no acute distress and comfortable SKIN: no acute rashes, no significant lesions EYES: conjunctiva are pink and non-injected, sclera anicteric OROPHARYNX: MMM, no exudates, no oropharyngeal erythema or ulceration NECK: supple, no JVD LYMPH:  no palpable lymphadenopathy in the cervical, axillary or inguinal regions LUNGS: clear to auscultation b/l with normal respiratory effort HEART: regular rate & rhythm ABDOMEN:  normoactive bowel sounds , non tender, not distended. Extremity: no pedal edema PSYCH: alert & oriented x 3 with fluent speech NEURO: no focal motor/sensory deficits   LABORATORY DATA:  I have reviewed the data as listed.    Latest Ref Rng & Units 05/22/2023   10:13 AM 05/08/2023    1:30 PM 04/24/2023   12:58 PM  CBC  WBC 4.0 - 10.5 K/uL 8.2  7.1  9.0   Hemoglobin 13.0 - 17.0 g/dL 89.4  9.9  89.7   Hematocrit 39.0 - 52.0 % 30.1  28.7  29.9   Platelets 150 - 400 K/uL 320  303  301    .    Latest Ref Rng & Units 05/22/2023   10:13 AM 05/08/2023    1:30 PM 04/24/2023   12:58 PM  CMP  Glucose 70 - 99 mg/dL 844  816  875   BUN 8 - 23 mg/dL 17  13  11    Creatinine 0.61 - 1.24 mg/dL 8.67  8.59  8.73   Sodium 135 - 145 mmol/L 140  142  139   Potassium 3.5 - 5.1 mmol/L 3.8  3.5  3.7   Chloride 98 - 111 mmol/L 102  103  103   CO2 22 - 32 mmol/L 22  22  21    Calcium  8.9 - 10.3 mg/dL 9.6  9.4  9.3   Total Protein 6.5 - 8.1 g/dL 6.1  5.9  5.6   Total Bilirubin 0.0 - 1.2 mg/dL 1.2  1.0  0.8   Alkaline Phos 38 - 126 U/L 82  82  82   AST 15 - 41  U/L 13  11  14    ALT 0 - 44 U/L 10  10  8         Bone Marrow Biopsy 09/29/2022:    RADIOGRAPHIC STUDIES: I have personally reviewed the radiological images as listed and agreed with the findings in the report. No results found.  ASSESSMENT & PLAN:   73 year old legally blind gentleman with significant functional limitations and limited social support presented with who was newly diagnosed with multiple myeloma and of May 2024 and was set up for chemo counseling to start daratumumab  dexamethasone  Velcade  and Xgeva and orders were placed and the patient was to get a PET scan but was lost to follow-up.  He had also been referred to social work at that time to help address his barriers of care. He is now admitted with progressive multiple myeloma.  1. Multiple myeloma, IgG kappa, diagnosed May 2024-untreated since patient did not follow-up now admitted with progressive myeloma with severe anemia and progressive renal failure.  CTs 12/04/2022-innumerable lytic lesions, large expansile lesion in the anterior right second rib, multiple pathologic rib fractures, expansile T4 (T3 on CT cervical spine) mass with soft tissue component extending into the central canal and left neural foramen, C7 transverse process fracture  Decadron  daily x 4 starting 12/05/2022  Cycle 1 day 1 Velcade  12/07/2022  Decadron  daily x 4 starting 12/12/2022  Cycle 1 day 8 Velcade  12/14/2022 2.  Acute/chronic renal failure 3.  Severe anemia 4.  Mild thrombocytopenia 5.  Diabetes 6.  Hypertension 7.  Glaucoma 8.  OSA  PLAN: -Discussed lab results from today, 05/22/2023, in detail with the patient. CBC shows low hemoglobin of 10.5 g/dL with hematocrit of 69.8%.   Myeloma labs done on 05/08/2023 showed no observable M spike And kappa lambda free light chains are within normal limits with normal ratio. -continue to optimize food and water  intake to to support strength -continue Dara/Dex. Now off velcade  -Patient has been  tolerating his treatment well without any new or severe toxicities.  -Discussed that cycle 5 and cycle 6 of his treatments will be every 2 weeks and after cycle 6 it will be once a month.   FOLLOW-UP: Per integrated scheduling  The total time spent in the appointment was 30 minutes* .  All of the patient's questions were answered with apparent satisfaction. The patient knows to call the clinic with any problems, questions or concerns.   Emaline Saran MD MS AAHIVMS [facility] Hematology/Oncology Physician [facility]  .*Total Encounter Time as defined by the Centers for Medicare and Medicaid Services includes, in addition to the face-to-face time of a patient visit (documented in the note above) non-face-to-face time: obtaining and reviewing outside history, ordering and reviewing medications, tests or procedures, care coordination (communications with other health care professionals or caregivers) and documentation in the medical record.   I,Param Shah,acting as a neurosurgeon for Emaline Saran, MD.,have documented all relevant documentation on the behalf of Emaline Saran, MD,as directed by  Emaline Saran, MD while in the presence of Emaline Saran, MD.  .I have reviewed the above documentation for accuracy and completeness, and I agree with the above. .Tahni Porchia Kishore Chanc Kervin MD

## 2023-05-24 ENCOUNTER — Other Ambulatory Visit: Payer: Self-pay

## 2023-05-24 ENCOUNTER — Inpatient Hospital Stay: Payer: Medicare PPO

## 2023-05-24 NOTE — Progress Notes (Signed)
[facility] Clinical Social Work  Initial Assessment   Scott Bass is a 73 y.o. year old male contacted by phone. Clinical Social Work was referred by medical provider for assessment of psychosocial needs.   SDOH (Social Determinants of Health) assessments performed: No SDOH Interventions    Flowsheet Row ED to Hosp-Admission (Discharged) from 01/26/2023 in [facility]LO[facility] UROLOGY ED to Hosp-Admission (Discharged) from 12/04/2022 in [facility]  SDOH Interventions    Food Insecurity Interventions Intervention Not Indicated, Inpatient TOC Patient Declined, Inpatient TOC  Housing Interventions Intervention Not Indicated, Inpatient TOC --  Transportation Interventions Intervention Not Indicated, Inpatient TOC --  Utilities Interventions Intervention Not Indicated, Inpatient TOC --       SDOH Screenings   Food Insecurity: No Food Insecurity (01/28/2023)  Recent Concern: Food Insecurity - Food Insecurity Present (12/06/2022)  Housing: Low Risk  (01/28/2023)  Transportation Needs: No Transportation Needs (01/28/2023)  Utilities: Not At Risk (01/28/2023)  Financial Resource Strain: Low Risk  (10/10/2022)   Received from [facility], [facility]  Physical Activity: Insufficiently Active (12/23/2020)   Received from [facility], [facility]  Social Connections: Unknown (09/16/2021)   Received from [facility], [facility]  Stress: No Stress Concern Present (12/23/2020)   Received from [facility], [facility]  Tobacco Use: Low Risk  (02/27/2023)     Distress Screen completed: No     No data to display            Family/Social Information:  Housing Arrangement: patient lives with spouse. Family members/support persons in your life? Family Transportation concerns: Patient has a neighbor transporting him to appointments.   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 .  Income  source: Supplemental Security Income Financial concerns:  Patient feels spouse and himself are doing "okay" right now.  Type of concern: None Food access concerns: not at this time, patient was made aware of food pantry. Religious or spiritual practice: No Services Currently in place:  Supportive Family, Income, Housing  Coping/ Adjustment to diagnosis: Patient understands treatment plan and what happens next? yes, patient reported he will learn more about maintenance treatment at this next visit.  Concerns about diagnosis and/or treatment: I'm not especially worried about anything Patient reported stressors:  Adjusting to the changes that he's experienced since starting  treatment. Hopes and/or priorities: For treatment to continue to work.  Patient enjoys  Prior to treatment, patient enjoyed being outside and cooking.  Current coping skills/ strengths: Average or above average intelligence , Capable of independent living , Communication skills , General fund of knowledge , Motivation for treatment/growth , and Supportive family/friends     SUMMARY: Current SDOH Barriers:  SDOH Housing Concern: Pest   Clinical Social Work Clinical Goal(s):  Patient will work with SW to address concerns related to Pest in the home  Interventions: Discussed common feeling and emotions when being diagnosed with cancer, and the importance of support during treatment Informed patient of the support team roles and support services at [facility] Provided CSW contact information and encouraged patient to call with any questions or concerns Discussed the [employer] Discussed Advance Directives / Goals of Care (Patient would like CSW to call back in a couple of weeks)    Follow Up Plan: CSW will follow-up with patient by phone  Patient verbalizes understanding of plan: Yes  Marguerita Merles, LCSW Clinical Social Worker [facility]

## 2023-05-28 ENCOUNTER — Encounter: Payer: Self-pay | Admitting: Hematology

## 2023-06-03 ENCOUNTER — Other Ambulatory Visit: Payer: Self-pay

## 2023-06-05 ENCOUNTER — Inpatient Hospital Stay: Payer: Medicare PPO | Admitting: Dietician

## 2023-06-05 ENCOUNTER — Encounter: Payer: Self-pay | Admitting: Licensed Clinical Social Worker

## 2023-06-05 ENCOUNTER — Inpatient Hospital Stay: Payer: Medicare PPO

## 2023-06-05 ENCOUNTER — Ambulatory Visit: Payer: Medicare PPO | Admitting: Hematology

## 2023-06-05 VITALS — BP 118/91 | HR 107 | Temp 97.8°F | Resp 18

## 2023-06-05 DIAGNOSIS — C9000 Multiple myeloma not having achieved remission: Secondary | ICD-10-CM

## 2023-06-05 DIAGNOSIS — Z5112 Encounter for antineoplastic immunotherapy: Secondary | ICD-10-CM | POA: Diagnosis not present

## 2023-06-05 DIAGNOSIS — Z7189 Other specified counseling: Secondary | ICD-10-CM

## 2023-06-05 LAB — CBC WITH DIFFERENTIAL (CANCER CENTER ONLY)
Abs Immature Granulocytes: 0.01 10*3/uL (ref 0.00–0.07)
Basophils Absolute: 0 10*3/uL (ref 0.0–0.1)
Basophils Relative: 0 %
Eosinophils Absolute: 0 10*3/uL (ref 0.0–0.5)
Eosinophils Relative: 1 %
HCT: 26 % — ABNORMAL LOW (ref 39.0–52.0)
Hemoglobin: 8.8 g/dL — ABNORMAL LOW (ref 13.0–17.0)
Immature Granulocytes: 0 %
Lymphocytes Relative: 17 %
Lymphs Abs: 1.1 10*3/uL (ref 0.7–4.0)
MCH: 32.1 pg (ref 26.0–34.0)
MCHC: 33.8 g/dL (ref 30.0–36.0)
MCV: 94.9 fL (ref 80.0–100.0)
Monocytes Absolute: 0.7 10*3/uL (ref 0.1–1.0)
Monocytes Relative: 11 %
Neutro Abs: 4.6 10*3/uL (ref 1.7–7.7)
Neutrophils Relative %: 71 %
Platelet Count: 299 10*3/uL (ref 150–400)
RBC: 2.74 MIL/uL — ABNORMAL LOW (ref 4.22–5.81)
RDW: 17.7 % — ABNORMAL HIGH (ref 11.5–15.5)
WBC Count: 6.5 10*3/uL (ref 4.0–10.5)
nRBC: 0 % (ref 0.0–0.2)

## 2023-06-05 LAB — CMP (CANCER CENTER ONLY)
ALT: 23 U/L (ref 0–44)
AST: 27 U/L (ref 15–41)
Albumin: 3 g/dL — ABNORMAL LOW (ref 3.5–5.0)
Alkaline Phosphatase: 75 U/L (ref 38–126)
Anion gap: 8 (ref 5–15)
BUN: 17 mg/dL (ref 8–23)
CO2: 25 mmol/L (ref 22–32)
Calcium: 9.1 mg/dL (ref 8.9–10.3)
Chloride: 106 mmol/L (ref 98–111)
Creatinine: 1.22 mg/dL (ref 0.61–1.24)
GFR, Estimated: >60 mL/min (ref >60)
Glucose, Bld: 223 mg/dL — ABNORMAL HIGH (ref 70–99)
Potassium: 3.5 mmol/L (ref 3.5–5.1)
Sodium: 139 mmol/L (ref 135–145)
Total Bilirubin: 0.5 mg/dL (ref 0.0–1.2)
Total Protein: 5.5 g/dL — ABNORMAL LOW (ref 6.5–8.1)

## 2023-06-05 MED ORDER — DIPHENHYDRAMINE HCL 25 MG PO CAPS
50.0000 mg | ORAL_CAPSULE | Freq: Once | ORAL | Status: AC
  Administered 2023-06-05: 50 mg via ORAL
  Filled 2023-06-05: qty 2

## 2023-06-05 MED ORDER — ACETAMINOPHEN 325 MG PO TABS
650.0000 mg | ORAL_TABLET | Freq: Once | ORAL | Status: AC
  Administered 2023-06-05: 650 mg via ORAL
  Filled 2023-06-05: qty 2

## 2023-06-05 MED ORDER — FAMOTIDINE 20 MG PO TABS
20.0000 mg | ORAL_TABLET | Freq: Once | ORAL | Status: AC
  Administered 2023-06-05: 20 mg via ORAL
  Filled 2023-06-05: qty 1

## 2023-06-05 MED ORDER — DARATUMUMAB-HYALURONIDASE-FIHJ 1800-30000 MG-UT/15ML ~~LOC~~ SOLN
1800.0000 mg | Freq: Once | SUBCUTANEOUS | Status: AC
  Administered 2023-06-05: 1800 mg via SUBCUTANEOUS
  Filled 2023-06-05: qty 15

## 2023-06-05 MED ORDER — DEXAMETHASONE 4 MG PO TABS
20.0000 mg | ORAL_TABLET | Freq: Once | ORAL | Status: AC
  Administered 2023-06-05: 20 mg via ORAL
  Filled 2023-06-05: qty 5

## 2023-06-05 NOTE — Patient Instructions (Signed)
[facility] - A DEPT OF [facility]  Discharge Instructions: Thank you for choosing [facility] to provide your oncology and hematology care.   If you have a lab appointment with the Cancer Center, please go directly to the Cancer Center and check in at the registration area.   Wear comfortable clothing and clothing appropriate for easy access to any Portacath or PICC line.   We strive to give you quality time with your provider. You may need to reschedule your appointment if you arrive late (15 or more minutes).  Arriving late affects you and other patients whose appointments are after yours.  Also, if you miss three or more appointments without notifying the office, you may be dismissed from the clinic at the provider's discretion.      For prescription refill requests, have your pharmacy contact our office and allow 72 hours for refills to be completed.    Today you received the following chemotherapy and/or immunotherapy agents: Dara Faspro.       To help prevent nausea and vomiting after your treatment, we encourage you to take your nausea medication as directed.  BELOW ARE SYMPTOMS THAT SHOULD BE REPORTED IMMEDIATELY: *FEVER GREATER THAN 100.4 F (38 C) OR HIGHER *CHILLS OR SWEATING *NAUSEA AND VOMITING THAT IS NOT CONTROLLED WITH YOUR NAUSEA MEDICATION *UNUSUAL SHORTNESS OF BREATH *UNUSUAL BRUISING OR BLEEDING *URINARY PROBLEMS (pain or burning when urinating, or frequent urination) *BOWEL PROBLEMS (unusual diarrhea, constipation, pain near the anus) TENDERNESS IN MOUTH AND THROAT WITH OR WITHOUT PRESENCE OF ULCERS (sore throat, sores in mouth, or a toothache) UNUSUAL RASH, SWELLING OR PAIN  UNUSUAL VAGINAL DISCHARGE OR ITCHING   Items with * indicate a potential emergency and should be followed up as soon as possible or go to the Emergency Department if any problems should occur.  Please show the CHEMOTHERAPY ALERT CARD or  IMMUNOTHERAPY ALERT CARD at check-in to the Emergency Department and triage nurse.  Should you have questions after your visit or need to cancel or reschedule your appointment, please contact [facility] - A DEPT OF [facility]  Dept: [phone]  and follow the prompts.  Office hours are 8:00 a.m. to 4:30 p.m. Monday - Friday. Please note that voicemails left after 4:00 p.m. may not be returned until the following business day.  We are closed weekends and major holidays. You have access to a nurse at all times for urgent questions. Please call the main number to the clinic Dept: [phone] and follow the prompts.   For any non-urgent questions, you may also contact your provider using MyChart. We now offer e-Visits for anyone 58 and older to request care online for non-urgent symptoms. For details visit [email or web address].   Also download the MyChart app! Go to the app store, search "MyChart", open the app, select [place], and log in with your MyChart username and password.

## 2023-06-05 NOTE — Progress Notes (Signed)
Nutrition Follow-up:  Patient with multiple myeloma. He is receiving daratumumab + dexamethasone under the care of Dr. Candise Che.    Met with patient in infusion. He is in the beds today secondary to persistent bedbugs. Patient states that he is ready to go when RD asked how pt was doing at time of visit. He reports nausea has improved and eating better at home. Patient tolerating grilled foods. States he had a grilled hot dog for dinner. Observed untouched sandwich and plate with cheese sticks/saltines on counter. RD offered to bring items to bed side table so he could eat. Patient declined. He does not eat cold ham or cheese. Patient agreeable to Ensure Complete samples and coupons.    Medications: reviewed   Labs: glucose 223, albumin 3.0  Anthropometrics: No new wts to trend. Last wt 105 lb 3.1 oz on 12/31   NUTRITION DIAGNOSIS: Inadequate oral intake continues    MALNUTRITION DIAGNOSIS: Severe malnutrition continues    INTERVENTION:  Encouraged high calorie high protein foods Encouraged daily Ensure Complete/equivalent - samples + coupons provided     MONITORING, EVALUATION, GOAL: wt trends, intake   NEXT VISIT: Tuesday February 25 during infusion

## 2023-06-05 NOTE — Progress Notes (Signed)
Per MD Candise Che, ok to treat with HR 107

## 2023-06-06 ENCOUNTER — Telehealth: Payer: Self-pay

## 2023-06-06 LAB — KAPPA/LAMBDA LIGHT CHAINS
Kappa free light chain: 19.1 mg/L (ref 3.3–19.4)
Kappa, lambda light chain ratio: 1.04 (ref 0.26–1.65)
Lambda free light chains: 18.3 mg/L (ref 5.7–26.3)

## 2023-06-06 NOTE — Telephone Encounter (Signed)
[facility] CSW Progress Note  Clinical Social Worker  attempted to reach patient and/or spouse on both numbers  to pest treatment for the home. CSW unable to reach patient and left general with direct contact.   Marguerita Merles, LCSW Clinical Social Worker [facility]

## 2023-06-08 LAB — MULTIPLE MYELOMA PANEL, SERUM
Albumin SerPl Elph-Mcnc: 2.7 g/dL — ABNORMAL LOW (ref 2.9–4.4)
Albumin/Glob SerPl: 1.2 (ref 0.7–1.7)
Alpha 1: 0.2 g/dL (ref 0.0–0.4)
Alpha2 Glob SerPl Elph-Mcnc: 0.7 g/dL (ref 0.4–1.0)
B-Globulin SerPl Elph-Mcnc: 0.9 g/dL (ref 0.7–1.3)
Gamma Glob SerPl Elph-Mcnc: 0.5 g/dL (ref 0.4–1.8)
Globulin, Total: 2.3 g/dL (ref 2.2–3.9)
IgA: 113 mg/dL (ref 61–437)
IgG (Immunoglobin G), Serum: 662 mg/dL (ref 603–1613)
IgM (Immunoglobulin M), Srm: 29 mg/dL (ref 15–143)
Total Protein ELP: 5 g/dL — ABNORMAL LOW (ref 6.0–8.5)

## 2023-06-13 ENCOUNTER — Telehealth: Payer: Self-pay

## 2023-06-13 NOTE — Telephone Encounter (Signed)
 [facility] CSW Progress Note  Clinical Social Worker  attempted to reach patient x2   to discuss pest treatment. CSW was unable to reach patient, left vm with direct contact. Medical Team updated.  Lizbeth Sprague, LCSW Clinical Social Worker [facility]

## 2023-06-18 ENCOUNTER — Encounter: Payer: Self-pay | Admitting: Family Medicine

## 2023-06-24 ENCOUNTER — Other Ambulatory Visit: Payer: Self-pay

## 2023-06-26 ENCOUNTER — Encounter: Payer: Self-pay | Admitting: Hematology

## 2023-07-03 ENCOUNTER — Encounter: Payer: Self-pay | Admitting: *Deleted

## 2023-07-03 ENCOUNTER — Inpatient Hospital Stay: Payer: Self-pay

## 2023-07-03 ENCOUNTER — Other Ambulatory Visit: Payer: Medicare PPO

## 2023-07-03 ENCOUNTER — Inpatient Hospital Stay: Payer: Medicare PPO | Attending: Hematology

## 2023-07-03 ENCOUNTER — Inpatient Hospital Stay: Payer: Self-pay | Admitting: Dietician

## 2023-07-03 ENCOUNTER — Inpatient Hospital Stay (HOSPITAL_BASED_OUTPATIENT_CLINIC_OR_DEPARTMENT_OTHER): Payer: Medicare PPO | Admitting: Hematology

## 2023-07-03 ENCOUNTER — Other Ambulatory Visit: Payer: Self-pay | Admitting: Lab

## 2023-07-03 VITALS — BP 140/85 | HR 100 | Temp 98.6°F | Resp 18

## 2023-07-03 DIAGNOSIS — Z5112 Encounter for antineoplastic immunotherapy: Secondary | ICD-10-CM | POA: Diagnosis not present

## 2023-07-03 DIAGNOSIS — C9000 Multiple myeloma not having achieved remission: Secondary | ICD-10-CM

## 2023-07-03 DIAGNOSIS — Z7189 Other specified counseling: Secondary | ICD-10-CM

## 2023-07-03 DIAGNOSIS — Z79620 Long term (current) use of immunosuppressive biologic: Secondary | ICD-10-CM | POA: Diagnosis not present

## 2023-07-03 DIAGNOSIS — E43 Unspecified severe protein-calorie malnutrition: Secondary | ICD-10-CM | POA: Diagnosis not present

## 2023-07-03 DIAGNOSIS — Z5111 Encounter for antineoplastic chemotherapy: Secondary | ICD-10-CM

## 2023-07-03 DIAGNOSIS — D649 Anemia, unspecified: Secondary | ICD-10-CM | POA: Diagnosis not present

## 2023-07-03 LAB — CBC WITH DIFFERENTIAL (CANCER CENTER ONLY)
Abs Immature Granulocytes: 0.02 10*3/uL (ref 0.00–0.07)
Basophils Absolute: 0 10*3/uL (ref 0.0–0.1)
Basophils Relative: 0 %
Eosinophils Absolute: 0.1 10*3/uL (ref 0.0–0.5)
Eosinophils Relative: 1 %
HCT: 23.4 % — ABNORMAL LOW (ref 39.0–52.0)
Hemoglobin: 8 g/dL — ABNORMAL LOW (ref 13.0–17.0)
Immature Granulocytes: 0 %
Lymphocytes Relative: 16 %
Lymphs Abs: 1.3 10*3/uL (ref 0.7–4.0)
MCH: 33.6 pg (ref 26.0–34.0)
MCHC: 34.2 g/dL (ref 30.0–36.0)
MCV: 98.3 fL (ref 80.0–100.0)
Monocytes Absolute: 0.8 10*3/uL (ref 0.1–1.0)
Monocytes Relative: 10 %
Neutro Abs: 5.7 10*3/uL (ref 1.7–7.7)
Neutrophils Relative %: 73 %
Platelet Count: 374 10*3/uL (ref 150–400)
RBC: 2.38 MIL/uL — ABNORMAL LOW (ref 4.22–5.81)
RDW: 17.8 % — ABNORMAL HIGH (ref 11.5–15.5)
WBC Count: 7.8 10*3/uL (ref 4.0–10.5)
nRBC: 0 % (ref 0.0–0.2)

## 2023-07-03 LAB — CMP (CANCER CENTER ONLY)
ALT: 9 U/L (ref 0–44)
AST: 13 U/L — ABNORMAL LOW (ref 15–41)
Albumin: 3.4 g/dL — ABNORMAL LOW (ref 3.5–5.0)
Alkaline Phosphatase: 53 U/L (ref 38–126)
Anion gap: 11 (ref 5–15)
BUN: 15 mg/dL (ref 8–23)
CO2: 25 mmol/L (ref 22–32)
Calcium: 9.1 mg/dL (ref 8.9–10.3)
Chloride: 108 mmol/L (ref 98–111)
Creatinine: 1.05 mg/dL (ref 0.61–1.24)
GFR, Estimated: >60 mL/min (ref >60)
Glucose, Bld: 107 mg/dL — ABNORMAL HIGH (ref 70–99)
Potassium: 3 mmol/L — ABNORMAL LOW (ref 3.5–5.1)
Sodium: 144 mmol/L (ref 135–145)
Total Bilirubin: 0.6 mg/dL (ref 0.0–1.2)
Total Protein: 6 g/dL — ABNORMAL LOW (ref 6.5–8.1)

## 2023-07-03 MED ORDER — FAMOTIDINE 20 MG PO TABS
20.0000 mg | ORAL_TABLET | Freq: Once | ORAL | Status: AC
  Administered 2023-07-03: 20 mg via ORAL
  Filled 2023-07-03: qty 1

## 2023-07-03 MED ORDER — DIPHENHYDRAMINE HCL 25 MG PO CAPS
50.0000 mg | ORAL_CAPSULE | Freq: Once | ORAL | Status: AC
  Administered 2023-07-03: 50 mg via ORAL
  Filled 2023-07-03: qty 2

## 2023-07-03 MED ORDER — DEXAMETHASONE 4 MG PO TABS
20.0000 mg | ORAL_TABLET | Freq: Once | ORAL | Status: AC
  Administered 2023-07-03: 20 mg via ORAL
  Filled 2023-07-03: qty 5

## 2023-07-03 MED ORDER — DARATUMUMAB-HYALURONIDASE-FIHJ 1800-30000 MG-UT/15ML ~~LOC~~ SOLN
1800.0000 mg | Freq: Once | SUBCUTANEOUS | Status: AC
  Administered 2023-07-03: 1800 mg via SUBCUTANEOUS
  Filled 2023-07-03: qty 15

## 2023-07-03 MED ORDER — ACETAMINOPHEN 325 MG PO TABS
650.0000 mg | ORAL_TABLET | Freq: Once | ORAL | Status: AC
  Administered 2023-07-03: 650 mg via ORAL
  Filled 2023-07-03: qty 2

## 2023-07-03 NOTE — Patient Instructions (Signed)

## 2023-07-03 NOTE — Progress Notes (Signed)
 Nutrition Follow-up:  Patient with multiple myeloma. He is receiving daratumumab + dexamethasone under the care of Dr. Candise Che.   Met with patient in infusion. Patient reports doing good today. His appetite has improved and eating better. Patient reports trying to eat 3 meals, but sometimes this does not happen. He is feeling hungry at visit and asking for a hotdog. Pt settled on trail mix as there are no hotdogs available in infusion. Patient is drinking 1 Ensure Complete and asking for additional samples as well as food assistance.   Medications: reviewed   Labs: Hgb 8, K 3.0, glucose 107, albumin 3.4  Anthropometrics: No new wts to trend - pt declined to be weighed today per infusion RN   NUTRITION DIAGNOSIS: Inadequate oral intake improving   MALNUTRITION DIAGNOSIS: Severe malnutrition continues    INTERVENTION:  Encourage high calorie high protein foods Continue 1 Ensure Complete - samples + coupons One bag of food + toiletries from [employer] provided     MONITORING, EVALUATION, GOAL: wt trends, intake   NEXT VISIT: Tuesday March 25 during infusion

## 2023-07-03 NOTE — Progress Notes (Signed)
 HEMATOLOGY/ONCOLOGY CLINIC NOTE  Date of Service: 07/03/2023  Patient Care Team: Tracey Harries, MD as PCP - General (Family Medicine)  CHIEF COMPLAINTS/PURPOSE OF CONSULTATION:  Evaluation and management of multiple myeloma.  HISTORY OF PRESENTING ILLNESS:  Scott Bass is a wonderful 73 y.o. male who has been referred to [place] by Tracey Harries, MD for evaluation and management of multiple myeloma.  Patient was in ED for symptomatic anemia on 09/08/2022. Patient presented to the ED and was hospitalized on 09/25/2022 for acute renal failure.   Today, he presents in a wheelchair and is accompanied by his wife. He reports that he is intermittently nauseous. He does not take anything to manage nausea. He denies any acid reflux symptoms.  He does have mild abdominal pain. He has had one bowel movement this week and does endorse a cycle of constipation and diarrhea. He reports recent poor p.o intake due to loss of appetite over the last month. He reports a weight loss of 5-6 pounds. He denies any leg swelling, radiation exposure, or neuropathy.  He reports that receiving blood transfusions during his hospitalization did improve energy levels. He was given Oxycodone at the hospital which he has run out of. He currently uses Tylenol for pain management.   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 Patient's wife is also blind. He reports that he is able to manage daily activities with his wife at home.   Patient suffered a fall from a chair after it broke. He reports worsened fatigue which began one month ago. He denies any syncope or lightheadedness/dizziness.  He complains of chest wall pain as well as left hip pain in the groin and laterally. This pain has improved.   His DM2 is fairly well-controlled. He reports that he needs a Metformin refill. He has self-discontinued Jardiance 1-2 months ago as he thought that it would affect blood sugars levels. His blood sugars at home have been  fairly stable with its highest level at 145.   His wife reports that patient has frequent urination with a burning sensation. Patient's wife reports that his confusion has improved by 80%. Patient reports that he is occasionally confused. She reports that patient may need assistance with bedside commode. Patient reports that he hospital has initiated home care services. Patient reports that he does not have a nephrologist.  INTERVAL HISTORY:  Scott Bass is a 73 y.o. male here for continued evaluation and management of multiple myeloma. He is here for his cycle 8 day 1 of treatment.   Patient was last seen by me on 05/22/2023 and he complained of abdominal pain, constipation, nausea when eating food, and weight loss.   Patient is unaccompanied during this visit. Patient notes he has been doing well overall since our last visit. He denies any new infection issues, fever, chills, night sweats, unexpected weight loss, back pain, chest pain, or leg swelling.  He does complain of diarrhea and left lower abdominal pain. He denies blood in stool, black stool, or any other abnormal bleeding.   Patient notes that his energy and his appetite have improved since our last visit.   Patient notes he has been tolerating his treatment well without any new or severe toxicities.   MEDICAL HISTORY:  Past Medical History:  Diagnosis Date   Arthritis    "lower back" (12/18/2017)   Better eye: moderate vision impairment; lesser eye: blind    GERD (gastroesophageal reflux disease)    Glaucoma, both eyes  High cholesterol    Hypertension    Hypothyroidism    Irregular heartbeat    Legally blind    "both eyes; can see some out of left eye"   OSA (obstructive sleep apnea)    Thyroid disease    Type II diabetes mellitus (HCC)    Vitamin B12 deficiency     SURGICAL HISTORY: Past Surgical History:  Procedure Laterality Date   LEFT HEART CATH AND CORONARY ANGIOGRAPHY N/A 12/18/2017   Procedure: LEFT  HEART CATH AND CORONARY ANGIOGRAPHY;  Surgeon: Elder Negus, MD;  Location: [facility];  Service: Cardiovascular;  Laterality: N/A;   NOSE SURGERY     "was crooked; they straightened it out" (12/18/2017)    SOCIAL HISTORY: Social History   Socioeconomic History   Marital status: Married    Spouse name: Not on file   Number of children: 0   Years of education: Not on file   Highest education level: Not on file  Occupational History   Occupation: retired  Tobacco Use   Smoking status: Never   Smokeless tobacco: Never  Vaping Use   Vaping status: Never Used  Substance and Sexual Activity   Alcohol use: Not Currently    Comment: occ   Drug use: Never   Sexual activity: Not Currently  Other Topics Concern   Not on file  Social History Narrative   Not on file   Social Drivers of Health   Financial Resource Strain: Low Risk  (10/10/2022)   Received from [facility], [facility]   Overall Financial Resource Strain (CARDIA)    Difficulty of Paying Living Expenses: Not very hard  Food Insecurity: Food Insecurity Present (06/05/2023)   Hunger Vital Sign    Worried About Running Out of Food in the Last Year: Sometimes true    Ran Out of Food in the Last Year: Sometimes true  Transportation Needs: No Transportation Needs (01/28/2023)   PRAPARE - Administrator, Civil Service (Medical): No    Lack of Transportation (Non-Medical): No  Physical Activity: Insufficiently Active (12/23/2020)   Received from [facility], [facility]   Exercise Vital Sign    Days of Exercise per Week: 2 days    Minutes of Exercise per Session: 10 min  Stress: No Stress Concern Present (12/23/2020)   Received from [facility], [facility] - Occupational Stress Questionnaire    Feeling of Stress : Not at all  Social Connections: Unknown (09/16/2021)   Received from [facility], [facility]   Social Network    Social  Network: Not on file  Intimate Partner Violence: Not At Risk (01/28/2023)   Humiliation, Afraid, Rape, and Kick questionnaire    Fear of Current or Ex-Partner: No    Emotionally Abused: No    Physically Abused: No    Sexually Abused: No    FAMILY HISTORY: Family History  Problem Relation Age of Onset   Diabetes Mellitus II Mother 45   Diabetes Mellitus II Sister    Breast cancer Sister    Heart failure Sister 51    ALLERGIES:  is allergic to hctz [hydrochlorothiazide].  MEDICATIONS:  Current Outpatient Medications  Medication Sig Dispense Refill   acetaminophen (TYLENOL) 500 MG tablet Take 2 tablets (1,000 mg total) by mouth every 8 (eight) hours as needed for moderate pain.     acyclovir (ZOVIRAX) 400 MG tablet Take 1 tablet (400 mg total) by mouth 2 (two) times  daily. (Patient not taking: Reported on 02/27/2023) 60 tablet 5   brimonidine (ALPHAGAN) 0.2 % ophthalmic solution Place 1 drop into both eyes 2 (two) times daily.  0   latanoprost (XALATAN) 0.005 % ophthalmic solution Place 1 drop into both eyes every evening.     levothyroxine (SYNTHROID, LEVOTHROID) 150 MCG tablet Take 150 mcg by mouth daily before breakfast.  0   metFORMIN (GLUCOPHAGE) 1000 MG tablet Take 1 tablet (1,000 mg total) by mouth daily with breakfast.     midodrine (PROAMATINE) 2.5 MG tablet Take 1 tablet (2.5 mg total) by mouth 2 (two) times daily with a meal. (Patient taking differently: Take 2.5 mg by mouth in the morning.) 60 tablet 1   ondansetron (ZOFRAN) 4 MG tablet Take 1 tablet (4 mg total) by mouth every 8 (eight) hours as needed for nausea or vomiting. 20 tablet 2   pantoprazole (PROTONIX) 40 MG tablet Take 1 tablet (40 mg total) by mouth daily. (Patient not taking: Reported on 02/27/2023)     potassium chloride SA (KLOR-CON M) 20 MEQ tablet Take 1 tablet (20 mEq total) by mouth 2 (two) times daily. 30 tablet 0   rosuvastatin (CRESTOR) 10 MG tablet Take 10 mg by mouth at bedtime.     TUMS 500 MG  chewable tablet Chew 1-2 tablets by mouth 2 (two) times daily as needed for indigestion or heartburn.     No current facility-administered medications for this visit.    REVIEW OF SYSTEMS:    10 Point review of Systems was done is negative except as noted above.   PHYSICAL EXAMINATION: ECOG PERFORMANCE STATUS: 3 - Symptomatic, >50% confined to bed VSS GENERAL:alert, in no acute distress and comfortable SKIN: no acute rashes, no significant lesions EYES: conjunctiva are pink and non-injected, sclera anicteric OROPHARYNX: MMM, no exudates, no oropharyngeal erythema or ulceration NECK: supple, no JVD LYMPH:  no palpable lymphadenopathy in the cervical, axillary or inguinal regions LUNGS: clear to auscultation b/l with normal respiratory effort HEART: regular rate & rhythm ABDOMEN:  normoactive bowel sounds , non tender, not distended. Extremity: no pedal edema PSYCH: alert & oriented x 3 with fluent speech NEURO: no focal motor/sensory deficits   LABORATORY DATA:  I have reviewed the data as listed.    Latest Ref Rng & Units 07/03/2023   12:28 PM 06/05/2023   12:13 PM 05/22/2023   10:13 AM  CBC  WBC 4.0 - 10.5 K/uL 7.8  6.5  8.2   Hemoglobin 13.0 - 17.0 g/dL 8.0  8.8  16.1   Hematocrit 39.0 - 52.0 % 23.4  26.0  30.1   Platelets 150 - 400 K/uL 374  299  320    .    Latest Ref Rng & Units 07/03/2023   12:28 PM 06/05/2023   12:13 PM 05/22/2023   10:13 AM  CMP  Glucose 70 - 99 mg/dL 096  045  409   BUN 8 - 23 mg/dL 15  17  17    Creatinine 0.61 - 1.24 mg/dL 8.11  9.14  7.82   Sodium 135 - 145 mmol/L 144  139  140   Potassium 3.5 - 5.1 mmol/L 3.0  3.5  3.8   Chloride 98 - 111 mmol/L 108  106  102   CO2 22 - 32 mmol/L 25  25  22    Calcium 8.9 - 10.3 mg/dL 9.1  9.1  9.6   Total Protein 6.5 - 8.1 g/dL 6.0  5.5  6.1   Total Bilirubin  0.0 - 1.2 mg/dL 0.6  0.5  1.2   Alkaline Phos 38 - 126 U/L 53  75  82   AST 15 - 41 U/L 13  27  13    ALT 0 - 44 U/L 9  23  10         Bone  Marrow Biopsy 09/29/2022:    RADIOGRAPHIC STUDIES: I have personally reviewed the radiological images as listed and agreed with the findings in the report. No results found.  ASSESSMENT & PLAN:   73 year old legally blind gentleman with significant functional limitations and limited social support presented with who was newly diagnosed with multiple myeloma and of May 2024 and was set up for chemo counseling to start daratumumab dexamethasone Velcade and Xgeva and orders were placed and the patient was to get a PET scan but was lost to follow-up.  He had also been referred to social work at that time to help address his barriers of care. He is now admitted with progressive multiple myeloma.  1. Multiple myeloma, IgG kappa, diagnosed May 2024-untreated since patient did not follow-up now admitted with progressive myeloma with severe anemia and progressive renal failure.  CTs 12/04/2022-innumerable lytic lesions, large expansile lesion in the anterior right second rib, multiple pathologic rib fractures, expansile T4 (T3 on CT cervical spine) mass with soft tissue component extending into the central canal and left neural foramen, C7 transverse process fracture  Decadron daily x 4 starting 12/05/2022  Cycle 1 day 1 Velcade 12/07/2022  Decadron daily x 4 starting 12/12/2022  Cycle 1 day 8 Velcade 12/14/2022 2.  Acute/chronic renal failure 3.  Severe anemia 4.  Mild thrombocytopenia 5.  Diabetes 6.  Hypertension 7.  Glaucoma 8.  OSA  PLAN: -Discussed lab results from today, 07/03/2023, in detail with the patient. CBC shows anemia with low Hgb of 8.0 g/dL with Hct of 40.9%. CMP shows low potassium level of 3.0 and slightly low AST level of 13.  -Discussed with the patient that there might be concern of blood in stool. We will get home stool testing. -continue to optimize food and water intake to to support strength -continue Dara/Dex. Now off velcade -Patient has been tolerating his treatment well  without any new or severe toxicities.  -Patient can proceed with his treatment as scheduled.  -Answered all of patient's questions.   FOLLOW-UP: -Referral to Dr Jeani Hawking for evaluation of anemia concern for GI bleeding -rpt labs including stool testing in 1 week -plz schedule next treatment for integrated scheduling  The total time spent in the appointment was 30 minutes* .  All of the patient's questions were answered with apparent satisfaction. The patient knows to call the clinic with any problems, questions or concerns.   Wyvonnia Lora MD MS AAHIVMS [facility] Hematology/Oncology Physician [facility]  .*Total Encounter Time as defined by the Centers for Medicare and Medicaid Services includes, in addition to the face-to-face time of a patient visit (documented in the note above) non-face-to-face time: obtaining and reviewing outside history, ordering and reviewing medications, tests or procedures, care coordination (communications with other health care professionals or caregivers) and documentation in the medical record.  I,Param Shah,acting as a Neurosurgeon for Wyvonnia Lora, MD.,have documented all relevant documentation on the behalf of Wyvonnia Lora, MD,as directed by  Wyvonnia Lora, MD while in the presence of Wyvonnia Lora, MD.  .I have reviewed the above documentation for accuracy and completeness, and I agree with the above. Johney Maine MD

## 2023-07-04 LAB — KAPPA/LAMBDA LIGHT CHAINS
Kappa free light chain: 20.9 mg/L — ABNORMAL HIGH (ref 3.3–19.4)
Kappa, lambda light chain ratio: 1.51 (ref 0.26–1.65)
Lambda free light chains: 13.8 mg/L (ref 5.7–26.3)

## 2023-07-06 ENCOUNTER — Encounter: Payer: Self-pay | Admitting: Hematology

## 2023-07-06 ENCOUNTER — Other Ambulatory Visit: Payer: Self-pay

## 2023-07-06 LAB — MULTIPLE MYELOMA PANEL, SERUM
Albumin SerPl Elph-Mcnc: 3.1 g/dL (ref 2.9–4.4)
Albumin/Glob SerPl: 1.4 (ref 0.7–1.7)
Alpha 1: 0.2 g/dL (ref 0.0–0.4)
Alpha2 Glob SerPl Elph-Mcnc: 0.7 g/dL (ref 0.4–1.0)
B-Globulin SerPl Elph-Mcnc: 0.9 g/dL (ref 0.7–1.3)
Gamma Glob SerPl Elph-Mcnc: 0.6 g/dL (ref 0.4–1.8)
Globulin, Total: 2.3 g/dL (ref 2.2–3.9)
IgA: 101 mg/dL (ref 61–437)
IgG (Immunoglobin G), Serum: 697 mg/dL (ref 603–1613)
IgM (Immunoglobulin M), Srm: 27 mg/dL (ref 15–143)
Total Protein ELP: 5.4 g/dL — ABNORMAL LOW (ref 6.0–8.5)

## 2023-07-09 ENCOUNTER — Encounter: Payer: Self-pay | Admitting: Hematology

## 2023-07-11 ENCOUNTER — Other Ambulatory Visit: Payer: Self-pay

## 2023-07-11 DIAGNOSIS — C9000 Multiple myeloma not having achieved remission: Secondary | ICD-10-CM

## 2023-07-11 DIAGNOSIS — D649 Anemia, unspecified: Secondary | ICD-10-CM

## 2023-07-31 ENCOUNTER — Inpatient Hospital Stay: Payer: Medicare HMO | Admitting: Dietician

## 2023-07-31 ENCOUNTER — Inpatient Hospital Stay: Payer: Medicare PPO

## 2023-07-31 ENCOUNTER — Inpatient Hospital Stay

## 2023-07-31 ENCOUNTER — Other Ambulatory Visit: Payer: Self-pay

## 2023-07-31 ENCOUNTER — Inpatient Hospital Stay: Payer: Medicare PPO | Attending: Hematology

## 2023-07-31 ENCOUNTER — Inpatient Hospital Stay (HOSPITAL_BASED_OUTPATIENT_CLINIC_OR_DEPARTMENT_OTHER): Payer: Medicare PPO | Admitting: Hematology

## 2023-07-31 VITALS — BP 145/73 | HR 80 | Temp 98.2°F | Resp 17

## 2023-07-31 DIAGNOSIS — R110 Nausea: Secondary | ICD-10-CM | POA: Insufficient documentation

## 2023-07-31 DIAGNOSIS — C9000 Multiple myeloma not having achieved remission: Secondary | ICD-10-CM | POA: Diagnosis present

## 2023-07-31 DIAGNOSIS — R197 Diarrhea, unspecified: Secondary | ICD-10-CM | POA: Diagnosis not present

## 2023-07-31 DIAGNOSIS — Z79890 Hormone replacement therapy (postmenopausal): Secondary | ICD-10-CM | POA: Diagnosis not present

## 2023-07-31 DIAGNOSIS — R634 Abnormal weight loss: Secondary | ICD-10-CM | POA: Insufficient documentation

## 2023-07-31 DIAGNOSIS — R410 Disorientation, unspecified: Secondary | ICD-10-CM | POA: Insufficient documentation

## 2023-07-31 DIAGNOSIS — I129 Hypertensive chronic kidney disease with stage 1 through stage 4 chronic kidney disease, or unspecified chronic kidney disease: Secondary | ICD-10-CM | POA: Diagnosis not present

## 2023-07-31 DIAGNOSIS — D696 Thrombocytopenia, unspecified: Secondary | ICD-10-CM | POA: Insufficient documentation

## 2023-07-31 DIAGNOSIS — R0789 Other chest pain: Secondary | ICD-10-CM | POA: Diagnosis not present

## 2023-07-31 DIAGNOSIS — N189 Chronic kidney disease, unspecified: Secondary | ICD-10-CM | POA: Insufficient documentation

## 2023-07-31 DIAGNOSIS — Z8249 Family history of ischemic heart disease and other diseases of the circulatory system: Secondary | ICD-10-CM | POA: Diagnosis not present

## 2023-07-31 DIAGNOSIS — Z79899 Other long term (current) drug therapy: Secondary | ICD-10-CM | POA: Insufficient documentation

## 2023-07-31 DIAGNOSIS — D649 Anemia, unspecified: Secondary | ICD-10-CM

## 2023-07-31 DIAGNOSIS — E1122 Type 2 diabetes mellitus with diabetic chronic kidney disease: Secondary | ICD-10-CM | POA: Insufficient documentation

## 2023-07-31 DIAGNOSIS — Z5112 Encounter for antineoplastic immunotherapy: Secondary | ICD-10-CM | POA: Insufficient documentation

## 2023-07-31 DIAGNOSIS — M25552 Pain in left hip: Secondary | ICD-10-CM | POA: Insufficient documentation

## 2023-07-31 DIAGNOSIS — R350 Frequency of micturition: Secondary | ICD-10-CM | POA: Insufficient documentation

## 2023-07-31 DIAGNOSIS — Z79620 Long term (current) use of immunosuppressive biologic: Secondary | ICD-10-CM | POA: Diagnosis not present

## 2023-07-31 DIAGNOSIS — E039 Hypothyroidism, unspecified: Secondary | ICD-10-CM | POA: Insufficient documentation

## 2023-07-31 DIAGNOSIS — Z5941 Food insecurity: Secondary | ICD-10-CM | POA: Diagnosis not present

## 2023-07-31 DIAGNOSIS — Z803 Family history of malignant neoplasm of breast: Secondary | ICD-10-CM | POA: Insufficient documentation

## 2023-07-31 DIAGNOSIS — Z833 Family history of diabetes mellitus: Secondary | ICD-10-CM | POA: Insufficient documentation

## 2023-07-31 DIAGNOSIS — Z7189 Other specified counseling: Secondary | ICD-10-CM

## 2023-07-31 DIAGNOSIS — H548 Legal blindness, as defined in USA: Secondary | ICD-10-CM | POA: Diagnosis not present

## 2023-07-31 LAB — CMP (CANCER CENTER ONLY)
ALT: 9 U/L (ref 0–44)
AST: 14 U/L — ABNORMAL LOW (ref 15–41)
Albumin: 3.8 g/dL (ref 3.5–5.0)
Alkaline Phosphatase: 54 U/L (ref 38–126)
Anion gap: 8 (ref 5–15)
BUN: 13 mg/dL (ref 8–23)
CO2: 26 mmol/L (ref 22–32)
Calcium: 9 mg/dL (ref 8.9–10.3)
Chloride: 106 mmol/L (ref 98–111)
Creatinine: 1.08 mg/dL (ref 0.61–1.24)
GFR, Estimated: >60 mL/min (ref >60)
Glucose, Bld: 126 mg/dL — ABNORMAL HIGH (ref 70–99)
Potassium: 3.5 mmol/L (ref 3.5–5.1)
Sodium: 140 mmol/L (ref 135–145)
Total Bilirubin: 0.4 mg/dL (ref 0.0–1.2)
Total Protein: 6.3 g/dL — ABNORMAL LOW (ref 6.5–8.1)

## 2023-07-31 LAB — CBC WITH DIFFERENTIAL (CANCER CENTER ONLY)
Abs Immature Granulocytes: 0.01 10*3/uL (ref 0.00–0.07)
Basophils Absolute: 0 10*3/uL (ref 0.0–0.1)
Basophils Relative: 1 %
Eosinophils Absolute: 0.1 10*3/uL (ref 0.0–0.5)
Eosinophils Relative: 2 %
HCT: 27.9 % — ABNORMAL LOW (ref 39.0–52.0)
Hemoglobin: 9.2 g/dL — ABNORMAL LOW (ref 13.0–17.0)
Immature Granulocytes: 0 %
Lymphocytes Relative: 27 %
Lymphs Abs: 1.7 10*3/uL (ref 0.7–4.0)
MCH: 33.9 pg (ref 26.0–34.0)
MCHC: 33 g/dL (ref 30.0–36.0)
MCV: 103 fL — ABNORMAL HIGH (ref 80.0–100.0)
Monocytes Absolute: 0.5 10*3/uL (ref 0.1–1.0)
Monocytes Relative: 9 %
Neutro Abs: 3.9 10*3/uL (ref 1.7–7.7)
Neutrophils Relative %: 61 %
Platelet Count: 294 10*3/uL (ref 150–400)
RBC: 2.71 MIL/uL — ABNORMAL LOW (ref 4.22–5.81)
RDW: 14.4 % (ref 11.5–15.5)
WBC Count: 6.2 10*3/uL (ref 4.0–10.5)
nRBC: 0 % (ref 0.0–0.2)

## 2023-07-31 LAB — FERRITIN: Ferritin: 1299 ng/mL — ABNORMAL HIGH (ref 24–336)

## 2023-07-31 LAB — IRON AND IRON BINDING CAPACITY (CC-WL,HP ONLY)
Iron: 48 ug/dL (ref 45–182)
Saturation Ratios: 21 % (ref 17.9–39.5)
TIBC: 230 ug/dL — ABNORMAL LOW (ref 250–450)
UIBC: 182 ug/dL (ref 117–376)

## 2023-07-31 LAB — SAMPLE TO BLOOD BANK

## 2023-07-31 LAB — VITAMIN B12: Vitamin B-12: 451 pg/mL (ref 180–914)

## 2023-07-31 MED ORDER — DARATUMUMAB-HYALURONIDASE-FIHJ 1800-30000 MG-UT/15ML ~~LOC~~ SOLN
1800.0000 mg | Freq: Once | SUBCUTANEOUS | Status: AC
  Administered 2023-07-31: 1800 mg via SUBCUTANEOUS
  Filled 2023-07-31: qty 15

## 2023-07-31 MED ORDER — DEXAMETHASONE 4 MG PO TABS
12.0000 mg | ORAL_TABLET | Freq: Once | ORAL | Status: AC
  Administered 2023-07-31: 12 mg via ORAL
  Filled 2023-07-31: qty 3

## 2023-07-31 MED ORDER — FAMOTIDINE 20 MG PO TABS
20.0000 mg | ORAL_TABLET | Freq: Once | ORAL | Status: AC
  Administered 2023-07-31: 20 mg via ORAL
  Filled 2023-07-31: qty 1

## 2023-07-31 MED ORDER — ACETAMINOPHEN 325 MG PO TABS
650.0000 mg | ORAL_TABLET | Freq: Once | ORAL | Status: AC
Start: 2023-07-31 — End: 2023-07-31
  Administered 2023-07-31: 650 mg via ORAL
  Filled 2023-07-31: qty 2

## 2023-07-31 MED ORDER — DIPHENHYDRAMINE HCL 25 MG PO CAPS
50.0000 mg | ORAL_CAPSULE | Freq: Once | ORAL | Status: AC
  Administered 2023-07-31: 50 mg via ORAL
  Filled 2023-07-31: qty 2

## 2023-07-31 NOTE — Progress Notes (Signed)
 Nutrition Follow-up:  Patient with multiple myeloma. He is receiving daratumumab + dexamethasone under the care of Dr. Candise Che.   Met with pt in infusion room. He is doing okay today. Pt thought he was not tolerating food recently secondary to diarrhea after po. He was able to identify the bread at home had gluten which he has an allergy to. Pt eating well without episodes of diarrhea with elimination of gluten. He eats meats and vegetables. Pt unable to recall what he had yesterday. He does drink Ensure daily when he has it. Pt requesting samples today.    Medications: reviewed   Labs: glucose 126  Anthropometrics: No new wts to trend. Last wt recorded 12/31   NUTRITION DIAGNOSIS: Inadequate oral intake improving   MALNUTRITION DIAGNOSIS: Severe malnutrition continues    INTERVENTION:  Encourage high calorie high protein foods Continue drinking Ensure Complete/equivalent - samples + coupons  Spoke with infusion RN - will obtain current wt at next infusion    MONITORING, EVALUATION, GOAL: wt trends, intake    NEXT VISIT: To be scheduled

## 2023-07-31 NOTE — Progress Notes (Signed)
 HEMATOLOGY/ONCOLOGY CLINIC NOTE  Date of Service: 07/31/2023  Patient Care Team: Tracey Harries, MD as PCP - General (Family Medicine)  CHIEF COMPLAINTS/PURPOSE OF CONSULTATION:  Evaluation and management of multiple myeloma.  HISTORY OF PRESENTING ILLNESS:  Scott Bass is a wonderful 73 y.o. male who has been referred to [place] by Tracey Harries, MD for evaluation and management of multiple myeloma.  Patient was in ED for symptomatic anemia on 09/08/2022. Patient presented to the ED and was hospitalized on 09/25/2022 for acute renal failure.   Today, he presents in a wheelchair and is accompanied by his wife. He reports that he is intermittently nauseous. He does not take anything to manage nausea. He denies any acid reflux symptoms.  He does have mild abdominal pain. He has had one bowel movement this week and does endorse a cycle of constipation and diarrhea. He reports recent poor p.o intake due to loss of appetite over the last month. He reports a weight loss of 5-6 pounds. He denies any leg swelling, radiation exposure, or neuropathy.  He reports that receiving blood transfusions during his hospitalization did improve energy levels. He was given Oxycodone at the hospital which he has run out of. He currently uses Tylenol for pain management.   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 Patient's wife is also blind. He reports that he is able to manage daily activities with his wife at home.   Patient suffered a fall from a chair after it broke. He reports worsened fatigue which began one month ago. He denies any syncope or lightheadedness/dizziness.  He complains of chest wall pain as well as left hip pain in the groin and laterally. This pain has improved.   His DM2 is fairly well-controlled. He reports that he needs a Metformin refill. He has self-discontinued Jardiance 1-2 months ago as he thought that it would affect blood sugars levels. His blood sugars at home have been  fairly stable with its highest level at 145.   His wife reports that patient has frequent urination with a burning sensation. Patient's wife reports that his confusion has improved by 80%. Patient reports that he is occasionally confused. She reports that patient may need assistance with bedside commode. Patient reports that he hospital has initiated home care services. Patient reports that he does not have a nephrologist.  INTERVAL HISTORY:  Scott Bass is a 73 y.o. male here for continued evaluation and management of multiple myeloma. He is here for his cycle 9 day 1 of treatment.   Patient was last seen by me on 07/03/2023 and he complained of diarrhea and left lower abdominal pain.   Patient is unaccompanied during this visit. Patient notes he has been doing well overall since our last visit. He denies any new infection issues, fever, chills, night sweats, unexpected weight loss, back pain, chest pain, abdominal pain, or leg swelling.   Patient has been tolerating his Daratumumab infusion well without any new or severe toxicities.  MEDICAL HISTORY:  Past Medical History:  Diagnosis Date   Arthritis    "lower back" (12/18/2017)   Better eye: moderate vision impairment; lesser eye: blind    GERD (gastroesophageal reflux disease)    Glaucoma, both eyes    High cholesterol    Hypertension    Hypothyroidism    Irregular heartbeat    Legally blind    "both eyes; can see some out of left eye"   OSA (obstructive sleep apnea)    Thyroid  disease    Type II diabetes mellitus (HCC)    Vitamin B12 deficiency     SURGICAL HISTORY: Past Surgical History:  Procedure Laterality Date   LEFT HEART CATH AND CORONARY ANGIOGRAPHY N/A 12/18/2017   Procedure: LEFT HEART CATH AND CORONARY ANGIOGRAPHY;  Surgeon: Elder Negus, MD;  Location: [facility];  Service: Cardiovascular;  Laterality: N/A;   NOSE SURGERY     "was crooked; they straightened it out" (12/18/2017)    SOCIAL  HISTORY: Social History   Socioeconomic History   Marital status: Married    Spouse name: Not on file   Number of children: 0   Years of education: Not on file   Highest education level: Not on file  Occupational History   Occupation: retired  Tobacco Use   Smoking status: Never   Smokeless tobacco: Never  Vaping Use   Vaping status: Never Used  Substance and Sexual Activity   Alcohol use: Not Currently    Comment: occ   Drug use: Never   Sexual activity: Not Currently  Other Topics Concern   Not on file  Social History Narrative   Not on file   Social Drivers of Health   Financial Resource Strain: Low Risk  (10/10/2022)   Received from [facility], [facility]   Overall Financial Resource Strain (CARDIA)    Difficulty of Paying Living Expenses: Not very hard  Food Insecurity: Food Insecurity Present (07/03/2023)   Hunger Vital Sign    Worried About Running Out of Food in the Last Year: Sometimes true    Ran Out of Food in the Last Year: Sometimes true  Transportation Needs: No Transportation Needs (01/28/2023)   PRAPARE - Administrator, Civil Service (Medical): No    Lack of Transportation (Non-Medical): No  Physical Activity: Insufficiently Active (12/23/2020)   Received from [facility], [facility]   Exercise Vital Sign    Days of Exercise per Week: 2 days    Minutes of Exercise per Session: 10 min  Stress: No Stress Concern Present (12/23/2020)   Received from [facility], [facility] - Occupational Stress Questionnaire    Feeling of Stress : Not at all  Social Connections: Unknown (09/16/2021)   Received from [facility], [facility]   Social Network    Social Network: Not on file  Intimate Partner Violence: Not At Risk (01/28/2023)   Humiliation, Afraid, Rape, and Kick questionnaire    Fear of Current or Ex-Partner: No    Emotionally Abused: No    Physically Abused: No    Sexually Abused:  No    FAMILY HISTORY: Family History  Problem Relation Age of Onset   Diabetes Mellitus II Mother 22   Diabetes Mellitus II Sister    Breast cancer Sister    Heart failure Sister 53    ALLERGIES:  is allergic to hctz [hydrochlorothiazide].  MEDICATIONS:  Current Outpatient Medications  Medication Sig Dispense Refill   acetaminophen (TYLENOL) 500 MG tablet Take 2 tablets (1,000 mg total) by mouth every 8 (eight) hours as needed for moderate pain.     acyclovir (ZOVIRAX) 400 MG tablet Take 1 tablet (400 mg total) by mouth 2 (two) times daily. (Patient not taking: Reported on 02/27/2023) 60 tablet 5   brimonidine (ALPHAGAN) 0.2 % ophthalmic solution Place 1 drop into both eyes 2 (two) times daily.  0   latanoprost (XALATAN) 0.005 % ophthalmic solution Place 1 drop into  both eyes every evening.     levothyroxine (SYNTHROID, LEVOTHROID) 150 MCG tablet Take 150 mcg by mouth daily before breakfast.  0   metFORMIN (GLUCOPHAGE) 1000 MG tablet Take 1 tablet (1,000 mg total) by mouth daily with breakfast.     midodrine (PROAMATINE) 2.5 MG tablet Take 1 tablet (2.5 mg total) by mouth 2 (two) times daily with a meal. (Patient taking differently: Take 2.5 mg by mouth in the morning.) 60 tablet 1   ondansetron (ZOFRAN) 4 MG tablet Take 1 tablet (4 mg total) by mouth every 8 (eight) hours as needed for nausea or vomiting. 20 tablet 2   pantoprazole (PROTONIX) 40 MG tablet Take 1 tablet (40 mg total) by mouth daily. (Patient not taking: Reported on 02/27/2023)     potassium chloride SA (KLOR-CON M) 20 MEQ tablet Take 1 tablet (20 mEq total) by mouth 2 (two) times daily. 30 tablet 0   rosuvastatin (CRESTOR) 10 MG tablet Take 10 mg by mouth at bedtime.     TUMS 500 MG chewable tablet Chew 1-2 tablets by mouth 2 (two) times daily as needed for indigestion or heartburn.     No current facility-administered medications for this visit.    REVIEW OF SYSTEMS:    10 Point review of Systems was done is  negative except as noted above.   PHYSICAL EXAMINATION: ECOG PERFORMANCE STATUS: 3 - Symptomatic, >50% confined to bed VSS  GENERAL:alert, in no acute distress and comfortable SKIN: no acute rashes, no significant lesions EYES: conjunctiva are pink and non-injected, sclera anicteric OROPHARYNX: MMM, no exudates, no oropharyngeal erythema or ulceration NECK: supple, no JVD LYMPH:  no palpable lymphadenopathy in the cervical, axillary or inguinal regions LUNGS: clear to auscultation b/l with normal respiratory effort HEART: regular rate & rhythm ABDOMEN:  normoactive bowel sounds , non tender, not distended. Extremity: no pedal edema PSYCH: alert & oriented x 3 with fluent speech NEURO: no focal motor/sensory deficits   LABORATORY DATA:  I have reviewed the data as listed.    Latest Ref Rng & Units 07/31/2023    1:28 PM 07/03/2023   12:28 PM 06/05/2023   12:13 PM  CBC  WBC 4.0 - 10.5 K/uL 6.2  7.8  6.5   Hemoglobin 13.0 - 17.0 g/dL 9.2  8.0  8.8   Hematocrit 39.0 - 52.0 % 27.9  23.4  26.0   Platelets 150 - 400 K/uL 294  374  299    .    Latest Ref Rng & Units 07/31/2023    1:28 PM 07/03/2023   12:28 PM 06/05/2023   12:13 PM  CMP  Glucose 70 - 99 mg/dL 409  811  914   BUN 8 - 23 mg/dL 13  15  17    Creatinine 0.61 - 1.24 mg/dL 7.82  9.56  2.13   Sodium 135 - 145 mmol/L 140  144  139   Potassium 3.5 - 5.1 mmol/L 3.5  3.0  3.5   Chloride 98 - 111 mmol/L 106  108  106   CO2 22 - 32 mmol/L 26  25  25    Calcium 8.9 - 10.3 mg/dL 9.0  9.1  9.1   Total Protein 6.5 - 8.1 g/dL 6.3  6.0  5.5   Total Bilirubin 0.0 - 1.2 mg/dL 0.4  0.6  0.5   Alkaline Phos 38 - 126 U/L 54  53  75   AST 15 - 41 U/L 14  13  27    ALT 0 -  44 U/L 9  9  23     Component     Latest Ref Rng 07/31/2023  Iron     45 - 182 ug/dL 48   TIBC     147 - 829 ug/dL 562 (L)   Saturation Ratios     17.9 - 39.5 % 21   UIBC     117 - 376 ug/dL 130   Ferritin     24 - 336 ng/mL 1,299 (H)   Vitamin B12     180 - 914  pg/mL 451     Legend: (L) Low (H) High    Bone Marrow Biopsy 09/29/2022:    RADIOGRAPHIC STUDIES: I have personally reviewed the radiological images as listed and agreed with the findings in the report. No results found.  ASSESSMENT & PLAN:   73 year old legally blind gentleman with significant functional limitations and limited social support presented with who was newly diagnosed with multiple myeloma and of May 2024 and was set up for chemo counseling to start daratumumab dexamethasone Velcade and Xgeva and orders were placed and the patient was to get a PET scan but was lost to follow-up.  He had also been referred to social work at that time to help address his barriers of care. He is now admitted with progressive multiple myeloma.  1. Multiple myeloma, IgG kappa, diagnosed May 2024-untreated since patient did not follow-up now admitted with progressive myeloma with severe anemia and progressive renal failure.  CTs 12/04/2022-innumerable lytic lesions, large expansile lesion in the anterior right second rib, multiple pathologic rib fractures, expansile T4 (T3 on CT cervical spine) mass with soft tissue component extending into the central canal and left neural foramen, C7 transverse process fracture  Decadron daily x 4 starting 12/05/2022  Cycle 1 day 1 Velcade 12/07/2022  Decadron daily x 4 starting 12/12/2022  Cycle 1 day 8 Velcade 12/14/2022 2.  Acute/chronic renal failure 3.  Severe anemia 4.  Mild thrombocytopenia 5.  Diabetes 6.  Hypertension 7.  Glaucoma 8.  OSA  PLAN: -Discussed lab results from today, 07/31/2023, in detail with the patient. CBC shows low hgb of 9.2 g/dL, improved from 8.0 g/dL, with hct of 86.5%.  CMP stable. -Multiple myeloma panel from today 07/31/2023 -- no M protein spike. -Multiple myeloma panel from 07/03/2023 did not show M-protein.  -continue to optimize food and water intake to to support strength -continue Dara/Dex. Now off velcade -Patient has  been tolerating his treatment well without any new or severe toxicities.  -Patient can proceed with his treatment as scheduled.  -Discussed the option of IV Iron infusion if he is iron-deficient. Pt agrees.  -Answered all of patient's questions.   FOLLOW-UP: Per integrated scheduling  The total time spent in the appointment was 20 minutes* .  All of the patient's questions were answered with apparent satisfaction. The patient knows to call the clinic with any problems, questions or concerns.   Wyvonnia Lora MD MS AAHIVMS [facility] Hematology/Oncology Physician [facility]  .*Total Encounter Time as defined by the Centers for Medicare and Medicaid Services includes, in addition to the face-to-face time of a patient visit (documented in the note above) non-face-to-face time: obtaining and reviewing outside history, ordering and reviewing medications, tests or procedures, care coordination (communications with other health care professionals or caregivers) and documentation in the medical record.   I,Param Shah,acting as a Neurosurgeon for Wyvonnia Lora, MD.,have documented all relevant documentation on the behalf of Wyvonnia Lora, MD,as directed by  Wyvonnia Lora, MD while in the presence of Wyvonnia Lora, MD.  .I have reviewed the above documentation for accuracy and completeness, and I agree with the above. Johney Maine MD

## 2023-07-31 NOTE — Patient Instructions (Signed)

## 2023-08-01 LAB — KAPPA/LAMBDA LIGHT CHAINS
Kappa free light chain: 15.8 mg/L (ref 3.3–19.4)
Kappa, lambda light chain ratio: 1.44 (ref 0.26–1.65)
Lambda free light chains: 11 mg/L (ref 5.7–26.3)

## 2023-08-02 LAB — MULTIPLE MYELOMA PANEL, SERUM
Albumin SerPl Elph-Mcnc: 3.1 g/dL (ref 2.9–4.4)
Albumin/Glob SerPl: 1.2 (ref 0.7–1.7)
Alpha 1: 0.2 g/dL (ref 0.0–0.4)
Alpha2 Glob SerPl Elph-Mcnc: 0.7 g/dL (ref 0.4–1.0)
B-Globulin SerPl Elph-Mcnc: 1 g/dL (ref 0.7–1.3)
Gamma Glob SerPl Elph-Mcnc: 0.7 g/dL (ref 0.4–1.8)
Globulin, Total: 2.6 g/dL (ref 2.2–3.9)
IgA: 80 mg/dL (ref 61–437)
IgG (Immunoglobin G), Serum: 765 mg/dL (ref 603–1613)
IgM (Immunoglobulin M), Srm: 28 mg/dL (ref 15–143)
Total Protein ELP: 5.7 g/dL — ABNORMAL LOW (ref 6.0–8.5)

## 2023-08-06 NOTE — Progress Notes (Incomplete)
 HEMATOLOGY/ONCOLOGY CLINIC NOTE  Date of Service: 07/31/2023  Patient Care Team: Tracey Harries, MD as PCP - General (Family Medicine)  CHIEF COMPLAINTS/PURPOSE OF CONSULTATION:  Evaluation and management of multiple myeloma.  HISTORY OF PRESENTING ILLNESS:  Scott Bass is a wonderful 73 y.o. male who has been referred to [place] by Tracey Harries, MD for evaluation and management of multiple myeloma.  Patient was in ED for symptomatic anemia on 09/08/2022. Patient presented to the ED and was hospitalized on 09/25/2022 for acute renal failure.   Today, he presents in a wheelchair and is accompanied by his wife. He reports that he is intermittently nauseous. He does not take anything to manage nausea. He denies any acid reflux symptoms.  He does have mild abdominal pain. He has had one bowel movement this week and does endorse a cycle of constipation and diarrhea. He reports recent poor p.o intake due to loss of appetite over the last month. He reports a weight loss of 5-6 pounds. He denies any leg swelling, radiation exposure, or neuropathy.  He reports that receiving blood transfusions during his hospitalization did improve energy levels. He was given Oxycodone at the hospital which he has run out of. He currently uses Tylenol for pain management.   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 Patient's wife is also blind. He reports that he is able to manage daily activities with his wife at home.   Patient suffered a fall from a chair after it broke. He reports worsened fatigue which began one month ago. He denies any syncope or lightheadedness/dizziness.  He complains of chest wall pain as well as left hip pain in the groin and laterally. This pain has improved.   His DM2 is fairly well-controlled. He reports that he needs a Metformin refill. He has self-discontinued Jardiance 1-2 months ago as he thought that it would affect blood sugars levels. His blood sugars at home have been  fairly stable with its highest level at 145.   His wife reports that patient has frequent urination with a burning sensation. Patient's wife reports that his confusion has improved by 80%. Patient reports that he is occasionally confused. She reports that patient may need assistance with bedside commode. Patient reports that he hospital has initiated home care services. Patient reports that he does not have a nephrologist.  INTERVAL HISTORY:  Scott Bass is a 73 y.o. male here for continued evaluation and management of multiple myeloma. He is here for his cycle 9 day 1 of treatment.   Patient was last seen by me on 07/03/2023 and he complained of diarrhea and left lower abdominal pain.   Patient is unaccompanied during this visit. Patient notes he has been doing well overall since our last visit. He denies any new infection issues, fever, chills, night sweats, unexpected weight loss, back pain, chest pain, abdominal pain, or leg swelling.   Patient has been tolerating his Daratumumab infusion well without any new or severe toxicities.  MEDICAL HISTORY:  Past Medical History:  Diagnosis Date  . Arthritis    "lower back" (12/18/2017)  . Better eye: moderate vision impairment; lesser eye: blind   . GERD (gastroesophageal reflux disease)   . Glaucoma, both eyes   . High cholesterol   . Hypertension   . Hypothyroidism   . Irregular heartbeat   . Legally blind    "both eyes; can see some out of left eye"  . OSA (obstructive sleep apnea)   . Thyroid  disease   . Type II diabetes mellitus (HCC)   . Vitamin B12 deficiency     SURGICAL HISTORY: Past Surgical History:  Procedure Laterality Date  . LEFT HEART CATH AND CORONARY ANGIOGRAPHY N/A 12/18/2017   Procedure: LEFT HEART CATH AND CORONARY ANGIOGRAPHY;  Surgeon: Elder Negus, MD;  Location: [facility];  Service: Cardiovascular;  Laterality: N/A;  . NOSE SURGERY     "was crooked; they straightened it out" (12/18/2017)     SOCIAL HISTORY: Social History   Socioeconomic History  . Marital status: Married    Spouse name: Not on file  . Number of children: 0  . Years of education: Not on file  . Highest education level: Not on file  Occupational History  . Occupation: retired  Tobacco Use  . Smoking status: Never  . Smokeless tobacco: Never  Vaping Use  . Vaping status: Never Used  Substance and Sexual Activity  . Alcohol use: Not Currently    Comment: occ  . Drug use: Never  . Sexual activity: Not Currently  Other Topics Concern  . Not on file  Social History Narrative  . Not on file   Social Drivers of Health   Financial Resource Strain: Low Risk  (10/10/2022)   Received from [facility], [facility]   Overall Financial Resource Strain (CARDIA)   . Difficulty of Paying Living Expenses: Not very hard  Food Insecurity: Food Insecurity Present (07/03/2023)   Hunger Vital Sign   . Worried About Programme researcher, broadcasting/film/video in the Last Year: Sometimes true   . Ran Out of Food in the Last Year: Sometimes true  Transportation Needs: No Transportation Needs (01/28/2023)   PRAPARE - Transportation   . Lack of Transportation (Medical): No   . Lack of Transportation (Non-Medical): No  Physical Activity: Insufficiently Active (12/23/2020)   Received from [facility], [facility]   Exercise Vital Sign   . Days of Exercise per Week: 2 days   . Minutes of Exercise per Session: 10 min  Stress: No Stress Concern Present (12/23/2020)   Received from [facility], [facility] - Occupational Stress Questionnaire   . Feeling of Stress : Not at all  Social Connections: Unknown (09/16/2021)   Received from [facility], [facility]   Social Network   . Social Network: Not on file  Intimate Partner Violence: Not At Risk (01/28/2023)   Humiliation, Afraid, Rape, and Kick questionnaire   . Fear of Current or Ex-Partner: No   . Emotionally Abused: No   .  Physically Abused: No   . Sexually Abused: No    FAMILY HISTORY: Family History  Problem Relation Age of Onset  . Diabetes Mellitus II Mother 21  . Diabetes Mellitus II Sister   . Breast cancer Sister   . Heart failure Sister 37    ALLERGIES:  is allergic to hctz [hydrochlorothiazide].  MEDICATIONS:  Current Outpatient Medications  Medication Sig Dispense Refill  . acetaminophen (TYLENOL) 500 MG tablet Take 2 tablets (1,000 mg total) by mouth every 8 (eight) hours as needed for moderate pain.    Marland Kitchen acyclovir (ZOVIRAX) 400 MG tablet Take 1 tablet (400 mg total) by mouth 2 (two) times daily. (Patient not taking: Reported on 02/27/2023) 60 tablet 5  . brimonidine (ALPHAGAN) 0.2 % ophthalmic solution Place 1 drop into both eyes 2 (two) times daily.  0  . latanoprost (XALATAN) 0.005 % ophthalmic solution Place 1 drop into  both eyes every evening.    Marland Kitchen levothyroxine (SYNTHROID, LEVOTHROID) 150 MCG tablet Take 150 mcg by mouth daily before breakfast.  0  . metFORMIN (GLUCOPHAGE) 1000 MG tablet Take 1 tablet (1,000 mg total) by mouth daily with breakfast.    . midodrine (PROAMATINE) 2.5 MG tablet Take 1 tablet (2.5 mg total) by mouth 2 (two) times daily with a meal. (Patient taking differently: Take 2.5 mg by mouth in the morning.) 60 tablet 1  . ondansetron (ZOFRAN) 4 MG tablet Take 1 tablet (4 mg total) by mouth every 8 (eight) hours as needed for nausea or vomiting. 20 tablet 2  . pantoprazole (PROTONIX) 40 MG tablet Take 1 tablet (40 mg total) by mouth daily. (Patient not taking: Reported on 02/27/2023)    . potassium chloride SA (KLOR-CON M) 20 MEQ tablet Take 1 tablet (20 mEq total) by mouth 2 (two) times daily. 30 tablet 0  . rosuvastatin (CRESTOR) 10 MG tablet Take 10 mg by mouth at bedtime.    . TUMS 500 MG chewable tablet Chew 1-2 tablets by mouth 2 (two) times daily as needed for indigestion or heartburn.     No current facility-administered medications for this visit.    REVIEW  OF SYSTEMS:    10 Point review of Systems was done is negative except as noted above.   PHYSICAL EXAMINATION: ECOG PERFORMANCE STATUS: 3 - Symptomatic, >50% confined to bed VSS  GENERAL:alert, in no acute distress and comfortable SKIN: no acute rashes, no significant lesions EYES: conjunctiva are pink and non-injected, sclera anicteric OROPHARYNX: MMM, no exudates, no oropharyngeal erythema or ulceration NECK: supple, no JVD LYMPH:  no palpable lymphadenopathy in the cervical, axillary or inguinal regions LUNGS: clear to auscultation b/l with normal respiratory effort HEART: regular rate & rhythm ABDOMEN:  normoactive bowel sounds , non tender, not distended. Extremity: no pedal edema PSYCH: alert & oriented x 3 with fluent speech NEURO: no focal motor/sensory deficits   LABORATORY DATA:  I have reviewed the data as listed.    Latest Ref Rng & Units 07/31/2023    1:28 PM 07/03/2023   12:28 PM 06/05/2023   12:13 PM  CBC  WBC 4.0 - 10.5 K/uL 6.2  7.8  6.5   Hemoglobin 13.0 - 17.0 g/dL 9.2  8.0  8.8   Hematocrit 39.0 - 52.0 % 27.9  23.4  26.0   Platelets 150 - 400 K/uL 294  374  299    .    Latest Ref Rng & Units 07/31/2023    1:28 PM 07/03/2023   12:28 PM 06/05/2023   12:13 PM  CMP  Glucose 70 - 99 mg/dL 782  956  213   BUN 8 - 23 mg/dL 13  15  17    Creatinine 0.61 - 1.24 mg/dL 0.86  5.78  4.69   Sodium 135 - 145 mmol/L 140  144  139   Potassium 3.5 - 5.1 mmol/L 3.5  3.0  3.5   Chloride 98 - 111 mmol/L 106  108  106   CO2 22 - 32 mmol/L 26  25  25    Calcium 8.9 - 10.3 mg/dL 9.0  9.1  9.1   Total Protein 6.5 - 8.1 g/dL 6.3  6.0  5.5   Total Bilirubin 0.0 - 1.2 mg/dL 0.4  0.6  0.5   Alkaline Phos 38 - 126 U/L 54  53  75   AST 15 - 41 U/L 14  13  27    ALT 0 -  44 U/L 9  9  23     Component     Latest Ref Rng 07/31/2023  Iron     45 - 182 ug/dL 48   TIBC     161 - 096 ug/dL 045 (L)   Saturation Ratios     17.9 - 39.5 % 21   UIBC     117 - 376 ug/dL 409   Ferritin      24 - 336 ng/mL 1,299 (H)   Vitamin B12     180 - 914 pg/mL 451     Legend: (L) Low (H) High    Bone Marrow Biopsy 09/29/2022:    RADIOGRAPHIC STUDIES: I have personally reviewed the radiological images as listed and agreed with the findings in the report. No results found.  ASSESSMENT & PLAN:   73 year old legally blind gentleman with significant functional limitations and limited social support presented with who was newly diagnosed with multiple myeloma and of May 2024 and was set up for chemo counseling to start daratumumab dexamethasone Velcade and Xgeva and orders were placed and the patient was to get a PET scan but was lost to follow-up.  He had also been referred to social work at that time to help address his barriers of care. He is now admitted with progressive multiple myeloma.  1. Multiple myeloma, IgG kappa, diagnosed May 2024-untreated since patient did not follow-up now admitted with progressive myeloma with severe anemia and progressive renal failure. . CTs 12/04/2022-innumerable lytic lesions, large expansile lesion in the anterior right second rib, multiple pathologic rib fractures, expansile T4 (T3 on CT cervical spine) mass with soft tissue component extending into the central canal and left neural foramen, C7 transverse process fracture . Decadron daily x 4 starting 12/05/2022 . Cycle 1 day 1 Velcade 12/07/2022 . Decadron daily x 4 starting 12/12/2022 . Cycle 1 day 8 Velcade 12/14/2022 2.  Acute/chronic renal failure 3.  Severe anemia 4.  Mild thrombocytopenia 5.  Diabetes 6.  Hypertension 7.  Glaucoma 8.  OSA  PLAN: -Discussed lab results from today, 07/31/2023, in detail with the patient. CBC shows low hgb of 9.2 g/dL, improved from 8.0 g/dL, with hct of 81.1%. CMP pending.  -Multiple myeloma panel from today pending.  -Multiple myeloma panel from 07/03/2023 did not show M-protein.  -continue to optimize food and water intake to to support strength -continue  Dara/Dex. Now off velcade -Patient has been tolerating his treatment well without any new or severe toxicities.  -Patient can proceed with his treatment as scheduled.  -Discussed the option of IV Iron infusion if he is iron-deficient. Pt agrees.  -Answered all of patient's questions.   FOLLOW-UP: ***  The total time spent in the appointment was *** minutes* .  All of the patient's questions were answered with apparent satisfaction. The patient knows to call the clinic with any problems, questions or concerns.   Wyvonnia Lora MD MS AAHIVMS [facility] Hematology/Oncology Physician [facility]  .*Total Encounter Time as defined by the Centers for Medicare and Medicaid Services includes, in addition to the face-to-face time of a patient visit (documented in the note above) non-face-to-face time: obtaining and reviewing outside history, ordering and reviewing medications, tests or procedures, care coordination (communications with other health care professionals or caregivers) and documentation in the medical record.   I,Param Shah,acting as a Neurosurgeon for Wyvonnia Lora, MD.,have documented all relevant documentation on the behalf of Wyvonnia Lora, MD,as directed by  Wyvonnia Lora, MD while in  the presence of Wyvonnia Lora, MD.

## 2023-08-07 ENCOUNTER — Encounter: Payer: Self-pay | Admitting: Hematology

## 2023-08-14 ENCOUNTER — Telehealth: Payer: Self-pay | Admitting: Hematology

## 2023-08-14 NOTE — Telephone Encounter (Signed)
 Left patient a vm regarding upcoming appointment t

## 2023-08-15 ENCOUNTER — Other Ambulatory Visit: Payer: Self-pay

## 2023-08-17 ENCOUNTER — Other Ambulatory Visit: Payer: Self-pay

## 2023-08-27 ENCOUNTER — Other Ambulatory Visit: Payer: Self-pay

## 2023-08-27 DIAGNOSIS — C9000 Multiple myeloma not having achieved remission: Secondary | ICD-10-CM

## 2023-08-28 ENCOUNTER — Inpatient Hospital Stay: Attending: Hematology

## 2023-08-28 ENCOUNTER — Inpatient Hospital Stay

## 2023-08-28 ENCOUNTER — Inpatient Hospital Stay (HOSPITAL_BASED_OUTPATIENT_CLINIC_OR_DEPARTMENT_OTHER): Admitting: Hematology

## 2023-08-28 ENCOUNTER — Other Ambulatory Visit: Payer: Self-pay | Admitting: Hematology

## 2023-08-28 VITALS — BP 148/82 | HR 106 | Temp 98.8°F | Resp 20 | Wt 127.6 lb

## 2023-08-28 DIAGNOSIS — C9000 Multiple myeloma not having achieved remission: Secondary | ICD-10-CM

## 2023-08-28 DIAGNOSIS — Z7189 Other specified counseling: Secondary | ICD-10-CM

## 2023-08-28 DIAGNOSIS — D649 Anemia, unspecified: Secondary | ICD-10-CM | POA: Diagnosis not present

## 2023-08-28 DIAGNOSIS — K9041 Non-celiac gluten sensitivity: Secondary | ICD-10-CM

## 2023-08-28 DIAGNOSIS — Z79899 Other long term (current) drug therapy: Secondary | ICD-10-CM | POA: Diagnosis not present

## 2023-08-28 DIAGNOSIS — Z5111 Encounter for antineoplastic chemotherapy: Secondary | ICD-10-CM | POA: Diagnosis not present

## 2023-08-28 DIAGNOSIS — Z5112 Encounter for antineoplastic immunotherapy: Secondary | ICD-10-CM | POA: Insufficient documentation

## 2023-08-28 LAB — CBC WITH DIFFERENTIAL (CANCER CENTER ONLY)
Abs Immature Granulocytes: 0.01 10*3/uL (ref 0.00–0.07)
Basophils Absolute: 0 10*3/uL (ref 0.0–0.1)
Basophils Relative: 0 %
Eosinophils Absolute: 0.2 10*3/uL (ref 0.0–0.5)
Eosinophils Relative: 2 %
HCT: 26.4 % — ABNORMAL LOW (ref 39.0–52.0)
Hemoglobin: 8.7 g/dL — ABNORMAL LOW (ref 13.0–17.0)
Immature Granulocytes: 0 %
Lymphocytes Relative: 20 %
Lymphs Abs: 1.5 10*3/uL (ref 0.7–4.0)
MCH: 32.2 pg (ref 26.0–34.0)
MCHC: 33 g/dL (ref 30.0–36.0)
MCV: 97.8 fL (ref 80.0–100.0)
Monocytes Absolute: 0.6 10*3/uL (ref 0.1–1.0)
Monocytes Relative: 7 %
Neutro Abs: 5.3 10*3/uL (ref 1.7–7.7)
Neutrophils Relative %: 71 %
Platelet Count: 260 10*3/uL (ref 150–400)
RBC: 2.7 MIL/uL — ABNORMAL LOW (ref 4.22–5.81)
RDW: 13 % (ref 11.5–15.5)
WBC Count: 7.6 10*3/uL (ref 4.0–10.5)
nRBC: 0 % (ref 0.0–0.2)

## 2023-08-28 LAB — CMP (CANCER CENTER ONLY)
ALT: 8 U/L (ref 0–44)
AST: 12 U/L — ABNORMAL LOW (ref 15–41)
Albumin: 3.6 g/dL (ref 3.5–5.0)
Alkaline Phosphatase: 55 U/L (ref 38–126)
Anion gap: 7 (ref 5–15)
BUN: 17 mg/dL (ref 8–23)
CO2: 26 mmol/L (ref 22–32)
Calcium: 8.9 mg/dL (ref 8.9–10.3)
Chloride: 109 mmol/L (ref 98–111)
Creatinine: 1.18 mg/dL (ref 0.61–1.24)
GFR, Estimated: >60 mL/min (ref >60)
Glucose, Bld: 132 mg/dL — ABNORMAL HIGH (ref 70–99)
Potassium: 3.9 mmol/L (ref 3.5–5.1)
Sodium: 142 mmol/L (ref 135–145)
Total Bilirubin: 0.4 mg/dL (ref 0.0–1.2)
Total Protein: 5.9 g/dL — ABNORMAL LOW (ref 6.5–8.1)

## 2023-08-28 MED ORDER — DARATUMUMAB-HYALURONIDASE-FIHJ 1800-30000 MG-UT/15ML ~~LOC~~ SOLN
1800.0000 mg | Freq: Once | SUBCUTANEOUS | Status: AC
  Administered 2023-08-28: 1800 mg via SUBCUTANEOUS
  Filled 2023-08-28: qty 15

## 2023-08-28 MED ORDER — FAMOTIDINE 20 MG PO TABS
20.0000 mg | ORAL_TABLET | Freq: Once | ORAL | Status: AC
  Administered 2023-08-28: 20 mg via ORAL
  Filled 2023-08-28: qty 1

## 2023-08-28 MED ORDER — DIPHENHYDRAMINE HCL 25 MG PO CAPS
50.0000 mg | ORAL_CAPSULE | Freq: Once | ORAL | Status: AC
  Administered 2023-08-28: 50 mg via ORAL
  Filled 2023-08-28: qty 2

## 2023-08-28 MED ORDER — ACETAMINOPHEN 325 MG PO TABS
650.0000 mg | ORAL_TABLET | Freq: Once | ORAL | Status: AC
  Administered 2023-08-28: 650 mg via ORAL
  Filled 2023-08-28: qty 2

## 2023-08-28 MED ORDER — DEXAMETHASONE 4 MG PO TABS
12.0000 mg | ORAL_TABLET | Freq: Once | ORAL | Status: AC
  Administered 2023-08-28: 12 mg via ORAL
  Filled 2023-08-28: qty 3

## 2023-08-28 NOTE — Progress Notes (Signed)
 HEMATOLOGY/ONCOLOGY CLINIC NOTE  Date of Service: 08/28/2023  Patient Care Team: Alfredia Ina, MD as PCP - General (Family Medicine)  CHIEF COMPLAINTS/PURPOSE OF CONSULTATION:  Evaluation and management of multiple myeloma.  HISTORY OF PRESENTING ILLNESS:  Scott Bass is a wonderful 73 y.o. male who has been referred to us  by Alfredia Ina, MD for evaluation and management of multiple myeloma.  Patient was in ED for symptomatic anemia on 09/08/2022. Patient presented to the ED and was hospitalized on 09/25/2022 for acute renal failure.   Today, he presents in a wheelchair and is accompanied by his wife. He reports that he is intermittently nauseous. He does not take anything to manage nausea. He denies any acid reflux symptoms.  He does have mild abdominal pain. He has had one bowel movement this week and does endorse a cycle of constipation and diarrhea. He reports recent poor p.o intake due to loss of appetite over the last month. He reports a weight loss of 5-6 pounds. He denies any leg swelling, radiation exposure, or neuropathy.  He reports that receiving blood transfusions during his hospitalization did improve energy levels. He was given Oxycodone  at the hospital which he has run out of. He currently uses Tylenol  for pain management.   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 Patient's wife is also blind. He reports that he is able to manage daily activities with his wife at home.   Patient suffered a fall from a chair after it broke. He reports worsened fatigue which began one month ago. He denies any syncope or lightheadedness/dizziness.  He complains of chest wall pain as well as left hip pain in the groin and laterally. This pain has improved.   His DM2 is fairly well-controlled. He reports that he needs a Metformin  refill. He has self-discontinued Jardiance  1-2 months ago as he thought that it would affect blood sugars levels. His blood sugars at home have been  fairly stable with its highest level at 145.   His wife reports that patient has frequent urination with a burning sensation. Patient's wife reports that his confusion has improved by 80%. Patient reports that he is occasionally confused. She reports that patient may need assistance with bedside commode. Patient reports that he hospital has initiated home care services. Patient reports that he does not have a nephrologist.  INTERVAL HISTORY:  Scott Bass is a 73 y.o. male here for continued evaluation and management of multiple myeloma. He is here for his cycle 9 day 1 of treatment.   Patient was last seen by me on 07/31/2023 and he was doing well overall.   Patient notes he has been dong well overall since our last visit. He denies any new infection issues, fever, chills, night sweats, unexpected weight loss, back pain, chest pain, abdominal pain, or leg swelling. He does report one episode of nose bleed this morning, which resolved on it's own.   Patient notes he is gluten intolerance since he gets sick after eating gluten-containing food. He has never been diagnosed with celiac disease.   He denies black stool or blood in stool.   Patient notes his appetite and his weight has improved since our last visit.   Patient has been tolerating his current treatment well without any new or severe toxicities.   He regularly takes Vitamin B-12, but denies B-Complex.    MEDICAL HISTORY:  Past Medical History:  Diagnosis Date   Arthritis    "lower back" (12/18/2017)  Better eye: moderate vision impairment; lesser eye: blind    GERD (gastroesophageal reflux disease)    Glaucoma, both eyes    High cholesterol    Hypertension    Hypothyroidism    Irregular heartbeat    Legally blind    "both eyes; can see some out of left eye"   OSA (obstructive sleep apnea)    Thyroid disease    Type II diabetes mellitus (HCC)    Vitamin B12 deficiency     SURGICAL HISTORY: Past Surgical History:   Procedure Laterality Date   LEFT HEART CATH AND CORONARY ANGIOGRAPHY N/A 12/18/2017   Procedure: LEFT HEART CATH AND CORONARY ANGIOGRAPHY;  Surgeon: Cody Das, MD;  Location: [facility];  Service: Cardiovascular;  Laterality: N/A;   NOSE SURGERY     "was crooked; they straightened it out" (12/18/2017)    SOCIAL HISTORY: Social History   Socioeconomic History   Marital status: Married    Spouse name: Not on file   Number of children: 0   Years of education: Not on file   Highest education level: Not on file  Occupational History   Occupation: retired  Tobacco Use   Smoking status: Never   Smokeless tobacco: Never  Vaping Use   Vaping status: Never Used  Substance and Sexual Activity   Alcohol use: Not Currently    Comment: occ   Drug use: Never   Sexual activity: Not Currently  Other Topics Concern   Not on file  Social History Narrative   Not on file   Social Drivers of Health   Financial Resource Strain: Low Risk  (10/10/2022)   Received from [facility], [facility]   Overall Financial Resource Strain (CARDIA)    Difficulty of Paying Living Expenses: Not very hard  Food Insecurity: Food Insecurity Present (07/03/2023)   Hunger Vital Sign    Worried About Running Out of Food in the Last Year: Sometimes true    Ran Out of Food in the Last Year: Sometimes true  Transportation Needs: No Transportation Needs (01/28/2023)   PRAPARE - Administrator, Civil Service (Medical): No    Lack of Transportation (Non-Medical): No  Physical Activity: Insufficiently Active (12/23/2020)   Received from [facility], [facility]   Exercise Vital Sign    Days of Exercise per Week: 2 days    Minutes of Exercise per Session: 10 min  Stress: No Stress Concern Present (12/23/2020)   Received from [facility], [facility] of Occupational Health - Occupational Stress Questionnaire    Feeling of Stress : Not at all  Social  Connections: Unknown (09/16/2021)   Received from [facility], [facility]   Social Network    Social Network: Not on file  Intimate Partner Violence: Not At Risk (01/28/2023)   Humiliation, Afraid, Rape, and Kick questionnaire    Fear of Current or Ex-Partner: No    Emotionally Abused: No    Physically Abused: No    Sexually Abused: No    FAMILY HISTORY: Family History  Problem Relation Age of Onset   Diabetes Mellitus II Mother 58   Diabetes Mellitus II Sister    Breast cancer Sister    Heart failure Sister 74    ALLERGIES:  is allergic to hctz [hydrochlorothiazide].  MEDICATIONS:  Current Outpatient Medications  Medication Sig Dispense Refill   acetaminophen  (TYLENOL ) 500 MG tablet Take 2 tablets (1,000 mg total) by mouth every 8 (eight) hours as  needed for moderate pain.     acyclovir  (ZOVIRAX ) 400 MG tablet Take 1 tablet (400 mg total) by mouth 2 (two) times daily. (Patient not taking: Reported on 02/27/2023) 60 tablet 5   brimonidine  (ALPHAGAN ) 0.2 % ophthalmic solution Place 1 drop into both eyes 2 (two) times daily.  0   latanoprost  (XALATAN ) 0.005 % ophthalmic solution Place 1 drop into both eyes every evening.     levothyroxine  (SYNTHROID , LEVOTHROID) 150 MCG tablet Take 150 mcg by mouth daily before breakfast.  0   metFORMIN  (GLUCOPHAGE ) 1000 MG tablet Take 1 tablet (1,000 mg total) by mouth daily with breakfast.     midodrine  (PROAMATINE ) 2.5 MG tablet Take 1 tablet (2.5 mg total) by mouth 2 (two) times daily with a meal. (Patient taking differently: Take 2.5 mg by mouth in the morning.) 60 tablet 1   ondansetron  (ZOFRAN ) 4 MG tablet Take 1 tablet (4 mg total) by mouth every 8 (eight) hours as needed for nausea or vomiting. 20 tablet 2   pantoprazole  (PROTONIX ) 40 MG tablet Take 1 tablet (40 mg total) by mouth daily. (Patient not taking: Reported on 02/27/2023)     potassium chloride  SA (KLOR-CON  M) 20 MEQ tablet Take 1 tablet (20 mEq total) by mouth 2 (two) times  daily. 30 tablet 0   rosuvastatin  (CRESTOR ) 10 MG tablet Take 10 mg by mouth at bedtime.     TUMS 500 MG chewable tablet Chew 1-2 tablets by mouth 2 (two) times daily as needed for indigestion or heartburn.     No current facility-administered medications for this visit.    REVIEW OF SYSTEMS:    10 Point review of Systems was done is negative except as noted above.   PHYSICAL EXAMINATION: ECOG PERFORMANCE STATUS: 3 - Symptomatic, >50% confined to bed VSS GENERAL:alert, in no acute distress and comfortable OROPHARYNX: MMM, no exudates, no oropharyngeal erythema or ulceration NECK: supple, no JVD LYMPH:  no palpable lymphadenopathy in the cervical, axillary or inguinal regions LUNGS: clear to auscultation b/l with normal respiratory effort HEART: regular rate & rhythm ABDOMEN:  normoactive bowel sounds , non tender, not distended. Extremity: no pedal edema PSYCH: alert & oriented x 3 with fluent speech NEURO: no focal motor/sensory deficits   LABORATORY DATA:  I have reviewed the data as listed.    Latest Ref Rng & Units 08/28/2023   12:31 PM 07/31/2023    1:28 PM 07/03/2023   12:28 PM  CBC  WBC 4.0 - 10.5 K/uL 7.6  6.2  7.8   Hemoglobin 13.0 - 17.0 g/dL 8.7  9.2  8.0   Hematocrit 39.0 - 52.0 % 26.4  27.9  23.4   Platelets 150 - 400 K/uL 260  294  374    .    Latest Ref Rng & Units 08/28/2023   12:31 PM 07/31/2023    1:28 PM 07/03/2023   12:28 PM  CMP  Glucose 70 - 99 mg/dL 409  811  914   BUN 8 - 23 mg/dL 17  13  15    Creatinine 0.61 - 1.24 mg/dL 7.82  9.56  2.13   Sodium 135 - 145 mmol/L 142  140  144   Potassium 3.5 - 5.1 mmol/L 3.9  3.5  3.0   Chloride 98 - 111 mmol/L 109  106  108   CO2 22 - 32 mmol/L 26  26  25    Calcium  8.9 - 10.3 mg/dL 8.9  9.0  9.1   Total Protein 6.5 -  8.1 g/dL 5.9  6.3  6.0   Total Bilirubin 0.0 - 1.2 mg/dL 0.4  0.4  0.6   Alkaline Phos 38 - 126 U/L 55  54  53   AST 15 - 41 U/L 12  14  13    ALT 0 - 44 U/L 8  9  9     Component     Latest  Ref Rng 07/31/2023  Iron     45 - 182 ug/dL 48   TIBC     161 - 096 ug/dL 045 (L)   Saturation Ratios     17.9 - 39.5 % 21   UIBC     117 - 376 ug/dL 409   Ferritin     24 - 336 ng/mL 1,299 (H)   Vitamin B12     180 - 914 pg/mL 451     Legend: (L) Low (H) High    Bone Marrow Biopsy 09/29/2022:    RADIOGRAPHIC STUDIES: I have personally reviewed the radiological images as listed and agreed with the findings in the report. No results found.  ASSESSMENT & PLAN:   73 year old legally blind gentleman with significant functional limitations and limited social support presented with who was newly diagnosed with multiple myeloma and of May 2024 and was set up for chemo counseling to start daratumumab  dexamethasone  Velcade  and Xgeva and orders were placed and the patient was to get a PET scan but was lost to follow-up.  He had also been referred to social work at that time to help address his barriers of care. He is now admitted with progressive multiple myeloma.  1. Multiple myeloma, IgG kappa, diagnosed May 2024-untreated since patient did not follow-up now admitted with progressive myeloma with severe anemia and progressive renal failure.  CTs 12/04/2022-innumerable lytic lesions, large expansile lesion in the anterior right second rib, multiple pathologic rib fractures, expansile T4 (T3 on CT cervical spine) mass with soft tissue component extending into the central canal and left neural foramen, C7 transverse process fracture  Decadron  daily x 4 starting 12/05/2022  Cycle 1 day 1 Velcade  12/07/2022  Decadron  daily x 4 starting 12/12/2022  Cycle 1 day 8 Velcade  12/14/2022 2.  Acute/chronic renal failure 3.  Severe anemia 4.  Mild thrombocytopenia 5.  Diabetes 6.  Hypertension 7.  Glaucoma 8.  OSA  PLAN: -Discussed lab results from today, 08/28/2023, in detail with the patient. CBC shows patient is anemic with low Hgb of 8.7 g/dL and Hct of 81.1%. CMP stable overall with slightly low  AST of 12.  -continue to optimize food and water  intake to to support strength -continue Dara/Dex. Now off velcade  -Patient has been tolerating his treatment well without any new or severe toxicities.  -Patient can proceed with his treatment as scheduled.  -Answered all of patient's questions.   -Recommend to start B-Complex and Vitamin-D.  -Recommend to follow-up with PCP for GI referral to evaluate for celiac disease and GI losses.  -Answered all of patient's questions.   FOLLOW-UP: Per integrated scheduling PCP to consider GI referral for evaluation of possible celiac disease and GI losses  The total time spent in the appointment was 30 minutes* .  All of the patient's questions were answered with apparent satisfaction. The patient knows to call the clinic with any problems, questions or concerns.   Jacquelyn Matt MD MS AAHIVMS [facility] Hematology/Oncology Physician [facility]  .*Total Encounter Time as defined by the Centers for Medicare and Medicaid Services includes, in addition  to the face-to-face time of a patient visit (documented in the note above) non-face-to-face time: obtaining and reviewing outside history, ordering and reviewing medications, tests or procedures, care coordination (communications with other health care professionals or caregivers) and documentation in the medical record.   I,Param Shah,acting as a Neurosurgeon for Jacquelyn Matt, MD.,have documented all relevant documentation on the behalf of Jacquelyn Matt, MD,as directed by  Jacquelyn Matt, MD while in the presence of Jacquelyn Matt, MD.  I have reviewed the above documentation for accuracy and completeness, and I agree with the above. .Ellijah Leffel Kishore Stavros Cail MD

## 2023-08-28 NOTE — Progress Notes (Signed)
 Per Dr. Salomon Cree, ok for treatment today with elevated heart rate 115. Pt. denies chest pain, dizziness, no SOB noted.

## 2023-08-28 NOTE — Patient Instructions (Signed)
 [facility] - A DEPT OF [facility]  Discharge Instructions: Thank you for choosing [facility] to provide your oncology and hematology care.   If you have a lab appointment with the Cancer Center, please go directly to the Cancer Center and check in at the registration area.   Wear comfortable clothing and clothing appropriate for easy access to any Portacath or PICC line.   We strive to give you quality time with your provider. You may need to reschedule your appointment if you arrive late (15 or more minutes).  Arriving late affects you and other patients whose appointments are after yours.  Also, if you miss three or more appointments without notifying the office, you may be dismissed from the clinic at the provider's discretion.      For prescription refill requests, have your pharmacy contact our office and allow 72 hours for refills to be completed.    Today you received the following chemotherapy and/or immunotherapy agent: Daratumumab Hyaluronidase (Darzalex Faspro)      To help prevent nausea and vomiting after your treatment, we encourage you to take your nausea medication as directed.  BELOW ARE SYMPTOMS THAT SHOULD BE REPORTED IMMEDIATELY: *FEVER GREATER THAN 100.4 F (38 C) OR HIGHER *CHILLS OR SWEATING *NAUSEA AND VOMITING THAT IS NOT CONTROLLED WITH YOUR NAUSEA MEDICATION *UNUSUAL SHORTNESS OF BREATH *UNUSUAL BRUISING OR BLEEDING *URINARY PROBLEMS (pain or burning when urinating, or frequent urination) *BOWEL PROBLEMS (unusual diarrhea, constipation, pain near the anus) TENDERNESS IN MOUTH AND THROAT WITH OR WITHOUT PRESENCE OF ULCERS (sore throat, sores in mouth, or a toothache) UNUSUAL RASH, SWELLING OR PAIN  UNUSUAL VAGINAL DISCHARGE OR ITCHING   Items with * indicate a potential emergency and should be followed up as soon as possible or go to the Emergency Department if any problems should occur.  Please show the  CHEMOTHERAPY ALERT CARD or IMMUNOTHERAPY ALERT CARD at check-in to the Emergency Department and triage nurse.  Should you have questions after your visit or need to cancel or reschedule your appointment, please contact [facility] - A DEPT OF [facility]  Dept: [phone]  and follow the prompts.  Office hours are 8:00 a.m. to 4:30 p.m. Monday - Friday. Please note that voicemails left after 4:00 p.m. may not be returned until the following business day.  We are closed weekends and major holidays. You have access to a nurse at all times for urgent questions. Please call the main number to the clinic Dept: [phone] and follow the prompts.   For any non-urgent questions, you may also contact your provider using MyChart. We now offer e-Visits for anyone 39 and older to request care online for non-urgent symptoms. For details visit [email or web address].   Also download the MyChart app! Go to the app store, search "MyChart", open the app, select [facility], and log in with your MyChart username and password.

## 2023-08-29 ENCOUNTER — Telehealth: Payer: Self-pay | Admitting: Hematology

## 2023-08-29 LAB — KAPPA/LAMBDA LIGHT CHAINS
Kappa free light chain: 18 mg/L (ref 3.3–19.4)
Kappa, lambda light chain ratio: 1.57 (ref 0.26–1.65)
Lambda free light chains: 11.5 mg/L (ref 5.7–26.3)

## 2023-08-29 NOTE — Telephone Encounter (Signed)
 Spoke with patient confirming upcoming appointment

## 2023-08-30 ENCOUNTER — Other Ambulatory Visit: Payer: Self-pay

## 2023-08-31 LAB — MULTIPLE MYELOMA PANEL, SERUM
Albumin SerPl Elph-Mcnc: 3.1 g/dL (ref 2.9–4.4)
Albumin/Glob SerPl: 1.4 (ref 0.7–1.7)
Alpha 1: 0.2 g/dL (ref 0.0–0.4)
Alpha2 Glob SerPl Elph-Mcnc: 0.6 g/dL (ref 0.4–1.0)
B-Globulin SerPl Elph-Mcnc: 0.9 g/dL (ref 0.7–1.3)
Gamma Glob SerPl Elph-Mcnc: 0.6 g/dL (ref 0.4–1.8)
Globulin, Total: 2.3 g/dL (ref 2.2–3.9)
IgA: 79 mg/dL (ref 61–437)
IgG (Immunoglobin G), Serum: 818 mg/dL (ref 603–1613)
IgM (Immunoglobulin M), Srm: 27 mg/dL (ref 15–143)
Total Protein ELP: 5.4 g/dL — ABNORMAL LOW (ref 6.0–8.5)

## 2023-09-04 ENCOUNTER — Encounter: Payer: Self-pay | Admitting: Hematology

## 2023-09-20 ENCOUNTER — Other Ambulatory Visit: Payer: Self-pay

## 2023-09-25 ENCOUNTER — Inpatient Hospital Stay

## 2023-09-25 ENCOUNTER — Inpatient Hospital Stay: Attending: Hematology | Admitting: Hematology

## 2023-09-25 VITALS — BP 129/79 | HR 91 | Temp 98.3°F | Resp 18 | Wt 118.4 lb

## 2023-09-25 DIAGNOSIS — D649 Anemia, unspecified: Secondary | ICD-10-CM | POA: Diagnosis not present

## 2023-09-25 DIAGNOSIS — Z8249 Family history of ischemic heart disease and other diseases of the circulatory system: Secondary | ICD-10-CM | POA: Insufficient documentation

## 2023-09-25 DIAGNOSIS — Z7189 Other specified counseling: Secondary | ICD-10-CM

## 2023-09-25 DIAGNOSIS — N189 Chronic kidney disease, unspecified: Secondary | ICD-10-CM | POA: Insufficient documentation

## 2023-09-25 DIAGNOSIS — Z833 Family history of diabetes mellitus: Secondary | ICD-10-CM | POA: Diagnosis not present

## 2023-09-25 DIAGNOSIS — C9001 Multiple myeloma in remission: Secondary | ICD-10-CM

## 2023-09-25 DIAGNOSIS — R634 Abnormal weight loss: Secondary | ICD-10-CM | POA: Diagnosis not present

## 2023-09-25 DIAGNOSIS — R110 Nausea: Secondary | ICD-10-CM | POA: Insufficient documentation

## 2023-09-25 DIAGNOSIS — M25552 Pain in left hip: Secondary | ICD-10-CM | POA: Insufficient documentation

## 2023-09-25 DIAGNOSIS — Z5112 Encounter for antineoplastic immunotherapy: Secondary | ICD-10-CM | POA: Diagnosis present

## 2023-09-25 DIAGNOSIS — E1122 Type 2 diabetes mellitus with diabetic chronic kidney disease: Secondary | ICD-10-CM | POA: Insufficient documentation

## 2023-09-25 DIAGNOSIS — C9000 Multiple myeloma not having achieved remission: Secondary | ICD-10-CM

## 2023-09-25 DIAGNOSIS — R410 Disorientation, unspecified: Secondary | ICD-10-CM | POA: Insufficient documentation

## 2023-09-25 DIAGNOSIS — Z5111 Encounter for antineoplastic chemotherapy: Secondary | ICD-10-CM

## 2023-09-25 DIAGNOSIS — R0789 Other chest pain: Secondary | ICD-10-CM | POA: Insufficient documentation

## 2023-09-25 DIAGNOSIS — Z5941 Food insecurity: Secondary | ICD-10-CM | POA: Insufficient documentation

## 2023-09-25 DIAGNOSIS — H548 Legal blindness, as defined in USA: Secondary | ICD-10-CM | POA: Insufficient documentation

## 2023-09-25 DIAGNOSIS — D696 Thrombocytopenia, unspecified: Secondary | ICD-10-CM | POA: Diagnosis not present

## 2023-09-25 DIAGNOSIS — R350 Frequency of micturition: Secondary | ICD-10-CM | POA: Insufficient documentation

## 2023-09-25 DIAGNOSIS — H409 Unspecified glaucoma: Secondary | ICD-10-CM | POA: Diagnosis not present

## 2023-09-25 DIAGNOSIS — Z79899 Other long term (current) drug therapy: Secondary | ICD-10-CM | POA: Diagnosis not present

## 2023-09-25 DIAGNOSIS — Z803 Family history of malignant neoplasm of breast: Secondary | ICD-10-CM | POA: Insufficient documentation

## 2023-09-25 DIAGNOSIS — R1030 Lower abdominal pain, unspecified: Secondary | ICD-10-CM | POA: Diagnosis not present

## 2023-09-25 DIAGNOSIS — I129 Hypertensive chronic kidney disease with stage 1 through stage 4 chronic kidney disease, or unspecified chronic kidney disease: Secondary | ICD-10-CM | POA: Insufficient documentation

## 2023-09-25 LAB — CBC WITH DIFFERENTIAL (CANCER CENTER ONLY)
Abs Immature Granulocytes: 0.02 10*3/uL (ref 0.00–0.07)
Basophils Absolute: 0 10*3/uL (ref 0.0–0.1)
Basophils Relative: 0 %
Eosinophils Absolute: 0.2 10*3/uL (ref 0.0–0.5)
Eosinophils Relative: 3 %
HCT: 25.9 % — ABNORMAL LOW (ref 39.0–52.0)
Hemoglobin: 8.9 g/dL — ABNORMAL LOW (ref 13.0–17.0)
Immature Granulocytes: 0 %
Lymphocytes Relative: 22 %
Lymphs Abs: 1.6 10*3/uL (ref 0.7–4.0)
MCH: 32.6 pg (ref 26.0–34.0)
MCHC: 34.4 g/dL (ref 30.0–36.0)
MCV: 94.9 fL (ref 80.0–100.0)
Monocytes Absolute: 0.6 10*3/uL (ref 0.1–1.0)
Monocytes Relative: 8 %
Neutro Abs: 4.9 10*3/uL (ref 1.7–7.7)
Neutrophils Relative %: 67 %
Platelet Count: 261 10*3/uL (ref 150–400)
RBC: 2.73 MIL/uL — ABNORMAL LOW (ref 4.22–5.81)
RDW: 13.3 % (ref 11.5–15.5)
WBC Count: 7.3 10*3/uL (ref 4.0–10.5)
nRBC: 0 % (ref 0.0–0.2)

## 2023-09-25 MED ORDER — DEXAMETHASONE 4 MG PO TABS
12.0000 mg | ORAL_TABLET | Freq: Once | ORAL | Status: AC
  Administered 2023-09-25: 12 mg via ORAL
  Filled 2023-09-25: qty 3

## 2023-09-25 MED ORDER — ACETAMINOPHEN 325 MG PO TABS
650.0000 mg | ORAL_TABLET | Freq: Once | ORAL | Status: AC
  Administered 2023-09-25: 650 mg via ORAL
  Filled 2023-09-25: qty 2

## 2023-09-25 MED ORDER — DARATUMUMAB-HYALURONIDASE-FIHJ 1800-30000 MG-UT/15ML ~~LOC~~ SOLN
1800.0000 mg | Freq: Once | SUBCUTANEOUS | Status: AC
  Administered 2023-09-25: 1800 mg via SUBCUTANEOUS
  Filled 2023-09-25: qty 15

## 2023-09-25 MED ORDER — FAMOTIDINE 20 MG PO TABS
20.0000 mg | ORAL_TABLET | Freq: Once | ORAL | Status: AC
  Administered 2023-09-25: 20 mg via ORAL
  Filled 2023-09-25: qty 1

## 2023-09-25 MED ORDER — DIPHENHYDRAMINE HCL 25 MG PO CAPS
50.0000 mg | ORAL_CAPSULE | Freq: Once | ORAL | Status: AC
  Administered 2023-09-25: 50 mg via ORAL
  Filled 2023-09-25: qty 2

## 2023-09-25 NOTE — Progress Notes (Signed)
 HEMATOLOGY/ONCOLOGY CLINIC NOTE  Date of Service: 09/25/2023  Patient Care Team: Alfredia Ina, MD as PCP - General (Family Medicine)  CHIEF COMPLAINTS/PURPOSE OF CONSULTATION:  Evaluation and management of multiple myeloma.  HISTORY OF PRESENTING ILLNESS:  Scott Bass is a wonderful 73 y.o. male who has been referred to us  by Alfredia Ina, MD for evaluation and management of multiple myeloma.  Patient was in ED for symptomatic anemia on 09/08/2022. Patient presented to the ED and was hospitalized on 09/25/2022 for acute renal failure.   Today, he presents in a wheelchair and is accompanied by his wife. He reports that he is intermittently nauseous. He does not take anything to manage nausea. He denies any acid reflux symptoms.  He does have mild abdominal pain. He has had one bowel movement this week and does endorse a cycle of constipation and diarrhea. He reports recent poor p.o intake due to loss of appetite over the last month. He reports a weight loss of 5-6 pounds. He denies any leg swelling, radiation exposure, or neuropathy.  He reports that receiving blood transfusions during his hospitalization did improve energy levels. He was given Oxycodone  at the hospital which he has run out of. He currently uses Tylenol  for pain management.   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 Patient's wife is also blind. He reports that he is able to manage daily activities with his wife at home.   Patient suffered a fall from a chair after it broke. He reports worsened fatigue which began one month ago. He denies any syncope or lightheadedness/dizziness.  He complains of chest wall pain as well as left hip pain in the groin and laterally. This pain has improved.   His DM2 is fairly well-controlled. He reports that he needs a Metformin  refill. He has self-discontinued Jardiance  1-2 months ago as he thought that it would affect blood sugars levels. His blood sugars at home have been  fairly stable with its highest level at 145.   His wife reports that patient has frequent urination with a burning sensation. Patient's wife reports that his confusion has improved by 80%. Patient reports that he is occasionally confused. She reports that patient may need assistance with bedside commode. Patient reports that he hospital has initiated home care services. Patient reports that he does not have a nephrologist.  INTERVAL HISTORY:  Scott Bass is a 73 y.o. male here for continued evaluation and management of multiple myeloma. He is here for toxicity check prior to cycle 11 day 1 of treatment.   Patient was last seen by me on 08/28/2023 and reported one episode of nose bleed and complained of getting sick with gluten intake.     MEDICAL HISTORY:  Past Medical History:  Diagnosis Date   Arthritis    "lower back" (12/18/2017)   Better eye: moderate vision impairment; lesser eye: blind    GERD (gastroesophageal reflux disease)    Glaucoma, both eyes    High cholesterol    Hypertension    Hypothyroidism    Irregular heartbeat    Legally blind    "both eyes; can see some out of left eye"   OSA (obstructive sleep apnea)    Thyroid disease    Type II diabetes mellitus (HCC)    Vitamin B12 deficiency     SURGICAL HISTORY: Past Surgical History:  Procedure Laterality Date   LEFT HEART CATH AND CORONARY ANGIOGRAPHY N/A 12/18/2017   Procedure: LEFT HEART CATH AND CORONARY ANGIOGRAPHY;  Surgeon: Cody Das, MD;  Location: [facility];  Service: Cardiovascular;  Laterality: N/A;   NOSE SURGERY     "was crooked; they straightened it out" (12/18/2017)    SOCIAL HISTORY: Social History   Socioeconomic History   Marital status: Married    Spouse name: Not on file   Number of children: 0   Years of education: Not on file   Highest education level: Not on file  Occupational History   Occupation: retired  Tobacco Use   Smoking status: Never   Smokeless  tobacco: Never  Vaping Use   Vaping status: Never Used  Substance and Sexual Activity   Alcohol use: Not Currently    Comment: occ   Drug use: Never   Sexual activity: Not Currently  Other Topics Concern   Not on file  Social History Narrative   Not on file   Social Drivers of Health   Financial Resource Strain: Low Risk  (10/10/2022)   Received from [facility], [facility]   Overall Financial Resource Strain (CARDIA)    Difficulty of Paying Living Expenses: Not very hard  Food Insecurity: Food Insecurity Present (07/03/2023)   Hunger Vital Sign    Worried About Running Out of Food in the Last Year: Sometimes true    Ran Out of Food in the Last Year: Sometimes true  Transportation Needs: No Transportation Needs (01/28/2023)   PRAPARE - Administrator, Civil Service (Medical): No    Lack of Transportation (Non-Medical): No  Physical Activity: Insufficiently Active (12/23/2020)   Received from [facility], [facility]   Exercise Vital Sign    Days of Exercise per Week: 2 days    Minutes of Exercise per Session: 10 min  Stress: No Stress Concern Present (12/23/2020)   Received from [facility], [facility] - Occupational Stress Questionnaire    Feeling of Stress : Not at all  Social Connections: Unknown (09/16/2021)   Received from [facility], [facility]   Social Network    Social Network: Not on file  Intimate Partner Violence: Not At Risk (01/28/2023)   Humiliation, Afraid, Rape, and Kick questionnaire    Fear of Current or Ex-Partner: No    Emotionally Abused: No    Physically Abused: No    Sexually Abused: No    FAMILY HISTORY: Family History  Problem Relation Age of Onset   Diabetes Mellitus II Mother 5   Diabetes Mellitus II Sister    Breast cancer Sister    Heart failure Sister 21    ALLERGIES:  is allergic to hctz [hydrochlorothiazide].  MEDICATIONS:  Current Outpatient Medications   Medication Sig Dispense Refill   acetaminophen  (TYLENOL ) 500 MG tablet Take 2 tablets (1,000 mg total) by mouth every 8 (eight) hours as needed for moderate pain.     acyclovir  (ZOVIRAX ) 400 MG tablet Take 1 tablet (400 mg total) by mouth 2 (two) times daily. (Patient not taking: Reported on 02/27/2023) 60 tablet 5   brimonidine  (ALPHAGAN ) 0.2 % ophthalmic solution Place 1 drop into both eyes 2 (two) times daily.  0   latanoprost  (XALATAN ) 0.005 % ophthalmic solution Place 1 drop into both eyes every evening.     levothyroxine  (SYNTHROID , LEVOTHROID) 150 MCG tablet Take 150 mcg by mouth daily before breakfast.  0   metFORMIN  (GLUCOPHAGE ) 1000 MG tablet Take 1 tablet (1,000 mg total) by mouth daily with breakfast.     midodrine  (PROAMATINE )  2.5 MG tablet Take 1 tablet (2.5 mg total) by mouth 2 (two) times daily with a meal. (Patient taking differently: Take 2.5 mg by mouth in the morning.) 60 tablet 1   ondansetron  (ZOFRAN ) 4 MG tablet Take 1 tablet (4 mg total) by mouth every 8 (eight) hours as needed for nausea or vomiting. 20 tablet 2   pantoprazole  (PROTONIX ) 40 MG tablet Take 1 tablet (40 mg total) by mouth daily. (Patient not taking: Reported on 02/27/2023)     potassium chloride  SA (KLOR-CON  M) 20 MEQ tablet Take 1 tablet (20 mEq total) by mouth 2 (two) times daily. 30 tablet 0   rosuvastatin  (CRESTOR ) 10 MG tablet Take 10 mg by mouth at bedtime.     TUMS 500 MG chewable tablet Chew 1-2 tablets by mouth 2 (two) times daily as needed for indigestion or heartburn.     No current facility-administered medications for this visit.    REVIEW OF SYSTEMS:    10 Point review of Systems was done is negative except as noted above.   PHYSICAL EXAMINATION: ECOG PERFORMANCE STATUS: 3 - Symptomatic, >50% confined to bed VSS   GENERAL:alert, in no acute distress and comfortable SKIN: no acute rashes, no significant lesions EYES: conjunctiva are pink and non-injected, sclera anicteric OROPHARYNX:  MMM, no exudates, no oropharyngeal erythema or ulceration NECK: supple, no JVD LYMPH:  no palpable lymphadenopathy in the cervical, axillary or inguinal regions LUNGS: clear to auscultation b/l with normal respiratory effort HEART: regular rate & rhythm ABDOMEN:  normoactive bowel sounds , non tender, not distended. Extremity: no pedal edema PSYCH: alert & oriented x 3 with fluent speech NEURO: no focal motor/sensory deficits   LABORATORY DATA:  I have reviewed the data as listed.    Latest Ref Rng & Units 08/28/2023   12:31 PM 07/31/2023    1:28 PM 07/03/2023   12:28 PM  CBC  WBC 4.0 - 10.5 K/uL 7.6  6.2  7.8   Hemoglobin 13.0 - 17.0 g/dL 8.7  9.2  8.0   Hematocrit 39.0 - 52.0 % 26.4  27.9  23.4   Platelets 150 - 400 K/uL 260  294  374    .    Latest Ref Rng & Units 08/28/2023   12:31 PM 07/31/2023    1:28 PM 07/03/2023   12:28 PM  CMP  Glucose 70 - 99 mg/dL 409  811  914   BUN 8 - 23 mg/dL 17  13  15    Creatinine 0.61 - 1.24 mg/dL 7.82  9.56  2.13   Sodium 135 - 145 mmol/L 142  140  144   Potassium 3.5 - 5.1 mmol/L 3.9  3.5  3.0   Chloride 98 - 111 mmol/L 109  106  108   CO2 22 - 32 mmol/L 26  26  25    Calcium  8.9 - 10.3 mg/dL 8.9  9.0  9.1   Total Protein 6.5 - 8.1 g/dL 5.9  6.3  6.0   Total Bilirubin 0.0 - 1.2 mg/dL 0.4  0.4  0.6   Alkaline Phos 38 - 126 U/L 55  54  53   AST 15 - 41 U/L 12  14  13    ALT 0 - 44 U/L 8  9  9     Component     Latest Ref Rng 07/31/2023  Iron     45 - 182 ug/dL 48   TIBC     086 - 578 ug/dL 469 (L)   Saturation Ratios  17.9 - 39.5 % 21   UIBC     117 - 376 ug/dL 403   Ferritin     24 - 336 ng/mL 1,299 (H)   Vitamin B12     180 - 914 pg/mL 451     Legend: (L) Low (H) High    Bone Marrow Biopsy 09/29/2022:    RADIOGRAPHIC STUDIES: I have personally reviewed the radiological images as listed and agreed with the findings in the report. No results found.  ASSESSMENT & PLAN:   73 year old legally blind gentleman with  significant functional limitations and limited social support presented with who was newly diagnosed with multiple myeloma and of May 2024 and was set up for chemo counseling to start daratumumab  dexamethasone  Velcade  and Xgeva and orders were placed and the patient was to get a PET scan but was lost to follow-up.  He had also been referred to social work at that time to help address his barriers of care. He is now admitted with progressive multiple myeloma.  1. Multiple myeloma, IgG kappa, diagnosed May 2024-untreated since patient did not follow-up now admitted with progressive myeloma with severe anemia and progressive renal failure.  CTs 12/04/2022-innumerable lytic lesions, large expansile lesion in the anterior right second rib, multiple pathologic rib fractures, expansile T4 (T3 on CT cervical spine) mass with soft tissue component extending into the central canal and left neural foramen, C7 transverse process fracture  Decadron  daily x 4 starting 12/05/2022  Cycle 1 day 1 Velcade  12/07/2022  Decadron  daily x 4 starting 12/12/2022  Cycle 1 day 8 Velcade  12/14/2022 2.  Acute/chronic renal failure 3.  Severe anemia 4.  Mild thrombocytopenia 5.  Diabetes 6.  Hypertension 7.  Glaucoma 8.  OSA  PLAN:  -discussed lab results from today, 09/25/23, in detail with patient  FOLLOW-UP: ***  The total time spent in the appointment was *** minutes* .  All of the patient's questions were answered with apparent satisfaction. The patient knows to call the clinic with any problems, questions or concerns.   Jacquelyn Matt MD MS AAHIVMS [facility] Hematology/Oncology Physician [facility]  .*Total Encounter Time as defined by the Centers for Medicare and Medicaid Services includes, in addition to the face-to-face time of a patient visit (documented in the note above) non-face-to-face time: obtaining and reviewing outside history, ordering and reviewing medications, tests or procedures, care  coordination (communications with other health care professionals or caregivers) and documentation in the medical record.    I,Mitra Faeizi,acting as a Neurosurgeon for Jacquelyn Matt, MD.,have documented all relevant documentation on the behalf of Jacquelyn Matt, MD,as directed by  Jacquelyn Matt, MD while in the presence of Jacquelyn Matt, MD.  ***

## 2023-09-25 NOTE — Patient Instructions (Signed)
 [facility] - A DEPT OF [facility]  Discharge Instructions: Thank you for choosing [facility] to provide your oncology and hematology care.   If you have a lab appointment with the Cancer Center, please go directly to the Cancer Center and check in at the registration area.   Wear comfortable clothing and clothing appropriate for easy access to any Portacath or PICC line.   We strive to give you quality time with your provider. You may need to reschedule your appointment if you arrive late (15 or more minutes).  Arriving late affects you and other patients whose appointments are after yours.  Also, if you miss three or more appointments without notifying the office, you may be dismissed from the clinic at the provider's discretion.      For prescription refill requests, have your pharmacy contact our office and allow 72 hours for refills to be completed.    Today you received the following chemotherapy and/or immunotherapy agent: Daratumumab Hyaluronidase (Darzalex Faspro)      To help prevent nausea and vomiting after your treatment, we encourage you to take your nausea medication as directed.  BELOW ARE SYMPTOMS THAT SHOULD BE REPORTED IMMEDIATELY: *FEVER GREATER THAN 100.4 F (38 C) OR HIGHER *CHILLS OR SWEATING *NAUSEA AND VOMITING THAT IS NOT CONTROLLED WITH YOUR NAUSEA MEDICATION *UNUSUAL SHORTNESS OF BREATH *UNUSUAL BRUISING OR BLEEDING *URINARY PROBLEMS (pain or burning when urinating, or frequent urination) *BOWEL PROBLEMS (unusual diarrhea, constipation, pain near the anus) TENDERNESS IN MOUTH AND THROAT WITH OR WITHOUT PRESENCE OF ULCERS (sore throat, sores in mouth, or a toothache) UNUSUAL RASH, SWELLING OR PAIN  UNUSUAL VAGINAL DISCHARGE OR ITCHING   Items with * indicate a potential emergency and should be followed up as soon as possible or go to the Emergency Department if any problems should occur.  Please show the  CHEMOTHERAPY ALERT CARD or IMMUNOTHERAPY ALERT CARD at check-in to the Emergency Department and triage nurse.  Should you have questions after your visit or need to cancel or reschedule your appointment, please contact [facility] - A DEPT OF [facility]  Dept: [phone]  and follow the prompts.  Office hours are 8:00 a.m. to 4:30 p.m. Monday - Friday. Please note that voicemails left after 4:00 p.m. may not be returned until the following business day.  We are closed weekends and major holidays. You have access to a nurse at all times for urgent questions. Please call the main number to the clinic Dept: [phone] and follow the prompts.   For any non-urgent questions, you may also contact your provider using MyChart. We now offer e-Visits for anyone 39 and older to request care online for non-urgent symptoms. For details visit [email or web address].   Also download the MyChart app! Go to the app store, search "MyChart", open the app, select [facility], and log in with your MyChart username and password.

## 2023-09-26 ENCOUNTER — Other Ambulatory Visit: Payer: Self-pay

## 2023-09-26 DIAGNOSIS — C9000 Multiple myeloma not having achieved remission: Secondary | ICD-10-CM

## 2023-09-26 LAB — KAPPA/LAMBDA LIGHT CHAINS
Kappa free light chain: 21.2 mg/L — ABNORMAL HIGH (ref 3.3–19.4)
Kappa, lambda light chain ratio: 1.55 (ref 0.26–1.65)
Lambda free light chains: 13.7 mg/L (ref 5.7–26.3)

## 2023-09-26 MED ORDER — POTASSIUM CHLORIDE CRYS ER 20 MEQ PO TBCR: 20.0000 meq | EXTENDED_RELEASE_TABLET | Freq: Two times a day (BID) | ORAL | 0 refills | Status: AC

## 2023-09-28 LAB — MULTIPLE MYELOMA PANEL, SERUM
Albumin SerPl Elph-Mcnc: 3.3 g/dL (ref 2.9–4.4)
Albumin/Glob SerPl: 1.5 (ref 0.7–1.7)
Alpha 1: 0.2 g/dL (ref 0.0–0.4)
Alpha2 Glob SerPl Elph-Mcnc: 0.6 g/dL (ref 0.4–1.0)
B-Globulin SerPl Elph-Mcnc: 0.9 g/dL (ref 0.7–1.3)
Gamma Glob SerPl Elph-Mcnc: 0.7 g/dL (ref 0.4–1.8)
Globulin, Total: 2.3 g/dL (ref 2.2–3.9)
IgA: 86 mg/dL (ref 61–437)
IgG (Immunoglobin G), Serum: 831 mg/dL (ref 603–1613)
IgM (Immunoglobulin M), Srm: 33 mg/dL (ref 15–143)
Total Protein ELP: 5.6 g/dL — ABNORMAL LOW (ref 6.0–8.5)

## 2023-09-30 ENCOUNTER — Other Ambulatory Visit: Payer: Self-pay

## 2023-10-01 ENCOUNTER — Encounter: Payer: Self-pay | Admitting: Hematology

## 2023-10-22 ENCOUNTER — Telehealth: Payer: Self-pay | Admitting: Hematology

## 2023-10-22 NOTE — Telephone Encounter (Signed)
 Rescheduled 6/17 appointments per 6/16 secure. Talked with the patient and he is aware of the changes made to his upcoming appointments.

## 2023-10-23 ENCOUNTER — Inpatient Hospital Stay

## 2023-10-23 ENCOUNTER — Other Ambulatory Visit: Payer: Self-pay

## 2023-10-23 ENCOUNTER — Inpatient Hospital Stay: Admitting: Physician Assistant

## 2023-10-23 ENCOUNTER — Inpatient Hospital Stay: Attending: Hematology

## 2023-10-23 VITALS — BP 159/87 | HR 88 | Temp 98.3°F | Resp 16

## 2023-10-23 DIAGNOSIS — Z79620 Long term (current) use of immunosuppressive biologic: Secondary | ICD-10-CM | POA: Insufficient documentation

## 2023-10-23 DIAGNOSIS — C9000 Multiple myeloma not having achieved remission: Secondary | ICD-10-CM | POA: Diagnosis present

## 2023-10-23 DIAGNOSIS — Z79899 Other long term (current) drug therapy: Secondary | ICD-10-CM | POA: Insufficient documentation

## 2023-10-23 DIAGNOSIS — K9041 Non-celiac gluten sensitivity: Secondary | ICD-10-CM

## 2023-10-23 DIAGNOSIS — Z5112 Encounter for antineoplastic immunotherapy: Secondary | ICD-10-CM | POA: Insufficient documentation

## 2023-10-23 DIAGNOSIS — E119 Type 2 diabetes mellitus without complications: Secondary | ICD-10-CM | POA: Insufficient documentation

## 2023-10-23 DIAGNOSIS — Z7189 Other specified counseling: Secondary | ICD-10-CM

## 2023-10-23 LAB — CBC WITH DIFFERENTIAL (CANCER CENTER ONLY)
Abs Immature Granulocytes: 0.02 10*3/uL (ref 0.00–0.07)
Basophils Absolute: 0 10*3/uL (ref 0.0–0.1)
Basophils Relative: 1 %
Eosinophils Absolute: 0.1 10*3/uL (ref 0.0–0.5)
Eosinophils Relative: 1 %
HCT: 27.5 % — ABNORMAL LOW (ref 39.0–52.0)
Hemoglobin: 9.3 g/dL — ABNORMAL LOW (ref 13.0–17.0)
Immature Granulocytes: 0 %
Lymphocytes Relative: 26 %
Lymphs Abs: 1.6 10*3/uL (ref 0.7–4.0)
MCH: 32 pg (ref 26.0–34.0)
MCHC: 33.8 g/dL (ref 30.0–36.0)
MCV: 94.5 fL (ref 80.0–100.0)
Monocytes Absolute: 0.5 10*3/uL (ref 0.1–1.0)
Monocytes Relative: 8 %
Neutro Abs: 3.9 10*3/uL (ref 1.7–7.7)
Neutrophils Relative %: 64 %
Platelet Count: 262 10*3/uL (ref 150–400)
RBC: 2.91 MIL/uL — ABNORMAL LOW (ref 4.22–5.81)
RDW: 14 % (ref 11.5–15.5)
WBC Count: 6 10*3/uL (ref 4.0–10.5)
nRBC: 0 % (ref 0.0–0.2)

## 2023-10-23 MED ORDER — DARATUMUMAB-HYALURONIDASE-FIHJ 1800-30000 MG-UT/15ML ~~LOC~~ SOLN
1800.0000 mg | Freq: Once | SUBCUTANEOUS | Status: AC
  Administered 2023-10-23: 1800 mg via SUBCUTANEOUS
  Filled 2023-10-23: qty 15

## 2023-10-23 MED ORDER — FAMOTIDINE 20 MG PO TABS
20.0000 mg | ORAL_TABLET | Freq: Once | ORAL | Status: AC
  Administered 2023-10-23: 20 mg via ORAL
  Filled 2023-10-23: qty 1

## 2023-10-23 MED ORDER — DIPHENHYDRAMINE HCL 25 MG PO CAPS
50.0000 mg | ORAL_CAPSULE | Freq: Once | ORAL | Status: AC
  Administered 2023-10-23: 50 mg via ORAL
  Filled 2023-10-23: qty 2

## 2023-10-23 MED ORDER — DEXAMETHASONE 4 MG PO TABS
12.0000 mg | ORAL_TABLET | Freq: Once | ORAL | Status: AC
  Administered 2023-10-23: 12 mg via ORAL
  Filled 2023-10-23: qty 3

## 2023-10-23 MED ORDER — ACETAMINOPHEN 325 MG PO TABS
650.0000 mg | ORAL_TABLET | Freq: Once | ORAL | Status: AC
  Administered 2023-10-23: 650 mg via ORAL
  Filled 2023-10-23: qty 2

## 2023-10-23 NOTE — Patient Instructions (Signed)
 [facility] - A DEPT OF [facility]  Discharge Instructions: Thank you for choosing [facility] to provide your oncology and hematology care.   If you have a lab appointment with the Cancer Center, please go directly to the Cancer Center and check in at the registration area.   Wear comfortable clothing and clothing appropriate for easy access to any Portacath or PICC line.   We strive to give you quality time with your provider. You may need to reschedule your appointment if you arrive late (15 or more minutes).  Arriving late affects you and other patients whose appointments are after yours.  Also, if you miss three or more appointments without notifying the office, you may be dismissed from the clinic at the provider's discretion.      For prescription refill requests, have your pharmacy contact our office and allow 72 hours for refills to be completed.    Today you received the following chemotherapy and/or immunotherapy agents Darzalex faspro      To help prevent nausea and vomiting after your treatment, we encourage you to take your nausea medication as directed.  BELOW ARE SYMPTOMS THAT SHOULD BE REPORTED IMMEDIATELY: *FEVER GREATER THAN 100.4 F (38 C) OR HIGHER *CHILLS OR SWEATING *NAUSEA AND VOMITING THAT IS NOT CONTROLLED WITH YOUR NAUSEA MEDICATION *UNUSUAL SHORTNESS OF BREATH *UNUSUAL BRUISING OR BLEEDING *URINARY PROBLEMS (pain or burning when urinating, or frequent urination) *BOWEL PROBLEMS (unusual diarrhea, constipation, pain near the anus) TENDERNESS IN MOUTH AND THROAT WITH OR WITHOUT PRESENCE OF ULCERS (sore throat, sores in mouth, or a toothache) UNUSUAL RASH, SWELLING OR PAIN  UNUSUAL VAGINAL DISCHARGE OR ITCHING   Items with * indicate a potential emergency and should be followed up as soon as possible or go to the Emergency Department if any problems should occur.  Please show the CHEMOTHERAPY ALERT CARD or  IMMUNOTHERAPY ALERT CARD at check-in to the Emergency Department and triage nurse.  Should you have questions after your visit or need to cancel or reschedule your appointment, please contact [facility] - A DEPT OF [facility]  Dept: [phone]  and follow the prompts.  Office hours are 8:00 a.m. to 4:30 p.m. Monday - Friday. Please note that voicemails left after 4:00 p.m. may not be returned until the following business day.  We are closed weekends and major holidays. You have access to a nurse at all times for urgent questions. Please call the main number to the clinic Dept: [phone] and follow the prompts.   For any non-urgent questions, you may also contact your provider using MyChart. We now offer e-Visits for anyone 40 and older to request care online for non-urgent symptoms. For details visit [email or web address].   Also download the MyChart app! Go to the app store, search "MyChart", open the app, select [place], and log in with your MyChart username and password.

## 2023-10-24 LAB — KAPPA/LAMBDA LIGHT CHAINS
Kappa free light chain: 17.5 mg/L (ref 3.3–19.4)
Kappa, lambda light chain ratio: 1.46 (ref 0.26–1.65)
Lambda free light chains: 12 mg/L (ref 5.7–26.3)

## 2023-10-25 LAB — CELIAC PANEL 10
Antigliadin Abs, IgA: 3 U (ref 0–19)
Endomysial Ab, IgA: NEGATIVE
Gliadin IgG: 3 U (ref 0–19)
IgA: 80 mg/dL (ref 61–437)
Tissue Transglut Ab: 4 U/mL (ref 0–5)
Tissue Transglutaminase Ab, IgA: <2 U/mL (ref 0–3)

## 2023-10-26 LAB — MULTIPLE MYELOMA PANEL, SERUM
Albumin SerPl Elph-Mcnc: 3.4 g/dL (ref 2.9–4.4)
Albumin/Glob SerPl: 1.5 (ref 0.7–1.7)
Alpha 1: 0.2 g/dL (ref 0.0–0.4)
Alpha2 Glob SerPl Elph-Mcnc: 0.6 g/dL (ref 0.4–1.0)
B-Globulin SerPl Elph-Mcnc: 0.9 g/dL (ref 0.7–1.3)
Gamma Glob SerPl Elph-Mcnc: 0.7 g/dL (ref 0.4–1.8)
Globulin, Total: 2.3 g/dL (ref 2.2–3.9)
IgA: 84 mg/dL (ref 61–437)
IgG (Immunoglobin G), Serum: 834 mg/dL (ref 603–1613)
IgM (Immunoglobulin M), Srm: 29 mg/dL (ref 15–143)
Total Protein ELP: 5.7 g/dL — ABNORMAL LOW (ref 6.0–8.5)

## 2023-10-29 ENCOUNTER — Encounter: Payer: Self-pay | Admitting: Hematology

## 2023-11-06 ENCOUNTER — Encounter: Payer: Self-pay | Admitting: Hematology

## 2023-11-13 ENCOUNTER — Ambulatory Visit: Payer: Self-pay | Admitting: Podiatry

## 2023-11-13 ENCOUNTER — Encounter: Payer: Self-pay | Admitting: Podiatry

## 2023-11-13 DIAGNOSIS — M79671 Pain in right foot: Secondary | ICD-10-CM

## 2023-11-13 DIAGNOSIS — B351 Tinea unguium: Secondary | ICD-10-CM | POA: Diagnosis not present

## 2023-11-13 DIAGNOSIS — M79672 Pain in left foot: Secondary | ICD-10-CM | POA: Diagnosis not present

## 2023-11-13 NOTE — Progress Notes (Signed)
 Patient presents for evaluation and treatment of tenderness and some redness around nails feet.  Tenderness around toes with walking and wearing shoes.  Physical exam:  General appearance: Alert, pleasant, and in no acute distress.  Vascular: Pedal pulses: DP 1/4 B/L, PT 0/4 B/L.  Ader at edema lower legs bilaterally  Neurological:    Dermatologic:  Nails thickened, disfigured, discolored 1-5 BL with subungual debris.  Redness and hypertrophic nail folds along nail folds bilaterally but no signs of drainage or infection.  Musculoskeletal:     Diagnosis: 1. Painful onychomycotic nails 1 through 5 bilaterally. 2. Pain toes 1 through 5 bilaterally.  Plan: Debrided onychomycotic nails 1 through 5 bilaterally.  Return 3 months [facility]

## 2023-11-14 ENCOUNTER — Encounter: Payer: Self-pay | Admitting: Hematology

## 2023-11-14 ENCOUNTER — Other Ambulatory Visit: Payer: Self-pay

## 2023-11-20 NOTE — Progress Notes (Signed)
 HEMATOLOGY/ONCOLOGY CLINIC NOTE  Date of Service: 11/21/2023  Patient Care Team: Pura Lenis, MD as PCP - General (Family Medicine)  CHIEF COMPLAINTS/PURPOSE OF CONSULTATION:  Evaluation and management of multiple myeloma.  HISTORY OF PRESENTING ILLNESS:  Scott Bass is a wonderful 73 y.o. male who has been referred to us  by Pura Lenis, MD for evaluation and management of multiple myeloma.  Patient was in ED for symptomatic anemia on 09/08/2022. Patient presented to the ED and was hospitalized on 09/25/2022 for acute renal failure.   Today, he presents in a wheelchair and is accompanied by his wife. He reports that he is intermittently nauseous. He does not take anything to manage nausea. He denies any acid reflux symptoms.  He does have mild abdominal pain. He has had one bowel movement this week and does endorse a cycle of constipation and diarrhea. He reports recent poor p.o intake due to loss of appetite over the last month. He reports a weight loss of 5-6 pounds. He denies any leg swelling, radiation exposure, or neuropathy.  He reports that receiving blood transfusions during his hospitalization did improve energy levels. He was given Oxycodone  at the hospital which he has run out of. He currently uses Tylenol  for pain management.   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 Patient's wife is also blind. He reports that he is able to manage daily activities with his wife at home.   Patient suffered a fall from a chair after it broke. He reports worsened fatigue which began one month ago. He denies any syncope or lightheadedness/dizziness.  He complains of chest wall pain as well as left hip pain in the groin and laterally. This pain has improved.   His DM2 is fairly well-controlled. He reports that he needs a Metformin  refill. He has self-discontinued Jardiance  1-2 months ago as he thought that it would affect blood sugars levels. His blood sugars at home have been  fairly stable with its highest level at 145.   His wife reports that patient has frequent urination with a burning sensation. Patient's wife reports that his confusion has improved by 80%. Patient reports that he is occasionally confused. She reports that patient may need assistance with bedside commode. Patient reports that he hospital has initiated home care services. Patient reports that he does not have a nephrologist.  INTERVAL HISTORY:  [name] is a 73 y.o. male here for continued evaluation and management of multiple myeloma.   Patient was last seen by me on 09/25/2023 and reported issues with bedbugs at home. He was otherwise doing well overall with no acute new symptoms.   Patient reports no new concerns since his last clinical visit. He notes that he is eating better. No nausea/vomitting or diarrhea. Reports taking his B complex. Was not able to collect stools for occult blood testing ordered previously.  MEDICAL HISTORY:  Past Medical History:  Diagnosis Date   Arthritis    lower back (12/18/2017)   Better eye: moderate vision impairment; lesser eye: blind    GERD (gastroesophageal reflux disease)    Glaucoma, both eyes    High cholesterol    Hypertension    Hypothyroidism    Irregular heartbeat    Legally blind    both eyes; can see some out of left eye   OSA (obstructive sleep apnea)    Thyroid disease    Type II diabetes mellitus (HCC)    Vitamin B12 deficiency     SURGICAL HISTORY:  Past Surgical History:  Procedure Laterality Date   LEFT HEART CATH AND CORONARY ANGIOGRAPHY N/A 12/18/2017   Procedure: LEFT HEART CATH AND CORONARY ANGIOGRAPHY;  Surgeon: Elmira Newman PARAS, MD;  Location: [facility];  Service: Cardiovascular;  Laterality: N/A;   NOSE SURGERY     was crooked; they straightened it out (12/18/2017)    SOCIAL HISTORY: Social History   Socioeconomic History   Marital status: Married    Spouse name: Not on file   Number of  children: 0   Years of education: Not on file   Highest education level: Not on file  Occupational History   Occupation: retired  Tobacco Use   Smoking status: Never   Smokeless tobacco: Never  Vaping Use   Vaping status: Never Used  Substance and Sexual Activity   Alcohol use: Not Currently    Comment: occ   Drug use: Never   Sexual activity: Not Currently  Other Topics Concern   Not on file  Social History Narrative   Not on file   Social Drivers of Health   Financial Resource Strain: Low Risk  (10/10/2022)   Received from [facility]   Overall Financial Resource Strain (CARDIA)    Difficulty of Paying Living Expenses: Not very hard  Food Insecurity: Food Insecurity Present (07/03/2023)   Hunger Vital Sign    Worried About Running Out of Food in the Last Year: Sometimes true    Ran Out of Food in the Last Year: Sometimes true  Transportation Needs: No Transportation Needs (01/28/2023)   PRAPARE - Administrator, Civil Service (Medical): No    Lack of Transportation (Non-Medical): No  Physical Activity: Insufficiently Active (12/23/2020)   Received from [facility]   Exercise Vital Sign    On average, how many days per week do you engage in moderate to strenuous exercise (like a brisk walk)?: 2 days    On average, how many minutes do you engage in exercise at this level?: 10 min  Stress: No Stress Concern Present (12/23/2020)   Received from [facility] - Occupational Stress Questionnaire    Feeling of Stress : Not at all  Social Connections: Unknown (09/16/2021)   Received from [facility]   Social Network    Social Network: Not on file  Intimate Partner Violence: Not At Risk (01/28/2023)   Humiliation, Afraid, Rape, and Kick questionnaire    Fear of Current or Ex-Partner: No    Emotionally Abused: No    Physically Abused: No    Sexually Abused: No    FAMILY HISTORY: Family History  Problem Relation Age  of Onset   Diabetes Mellitus II Mother 71   Diabetes Mellitus II Sister    Breast cancer Sister    Heart failure Sister 69    ALLERGIES:  is allergic to hctz [hydrochlorothiazide].  MEDICATIONS:  Current Outpatient Medications  Medication Sig Dispense Refill   acetaminophen  (TYLENOL ) 500 MG tablet Take 2 tablets (1,000 mg total) by mouth every 8 (eight) hours as needed for moderate pain.     acyclovir  (ZOVIRAX ) 400 MG tablet Take 1 tablet (400 mg total) by mouth 2 (two) times daily. (Patient not taking: Reported on 02/27/2023) 60 tablet 5   brimonidine  (ALPHAGAN ) 0.2 % ophthalmic solution Place 1 drop into both eyes 2 (two) times daily.  0   latanoprost  (XALATAN ) 0.005 % ophthalmic solution Place 1 drop into both eyes every evening.  levothyroxine  (SYNTHROID , LEVOTHROID) 150 MCG tablet Take 150 mcg by mouth daily before breakfast.  0   metFORMIN  (GLUCOPHAGE ) 1000 MG tablet Take 1 tablet (1,000 mg total) by mouth daily with breakfast.     midodrine  (PROAMATINE ) 2.5 MG tablet Take 1 tablet (2.5 mg total) by mouth 2 (two) times daily with a meal. (Patient taking differently: Take 2.5 mg by mouth in the morning.) 60 tablet 1   ondansetron  (ZOFRAN ) 4 MG tablet Take 1 tablet (4 mg total) by mouth every 8 (eight) hours as needed for nausea or vomiting. 20 tablet 2   pantoprazole  (PROTONIX ) 40 MG tablet Take 1 tablet (40 mg total) by mouth daily. (Patient not taking: Reported on 02/27/2023)     potassium chloride  SA (KLOR-CON  M) 20 MEQ tablet Take 1 tablet (20 mEq total) by mouth 2 (two) times daily. 30 tablet 0   rosuvastatin  (CRESTOR ) 10 MG tablet Take 10 mg by mouth at bedtime.     TUMS 500 MG chewable tablet Chew 1-2 tablets by mouth 2 (two) times daily as needed for indigestion or heartburn.     No current facility-administered medications for this visit.    REVIEW OF SYSTEMS:    10 Point review of Systems was done is negative except as noted above.   PHYSICAL EXAMINATION: ECOG  PERFORMANCE STATUS: 3 - Symptomatic, >50% confined to bed VSS GENERAL:alert, in no acute distress and comfortable SKIN: no acute rashes, no significant lesions EYES: conjunctiva are pink and non-injected, sclera anicteric OROPHARYNX: MMM, no exudates, no oropharyngeal erythema or ulceration NECK: supple, no JVD LYMPH:  no palpable lymphadenopathy in the cervical, axillary or inguinal regions LUNGS: clear to auscultation b/l with normal respiratory effort HEART: regular rate & rhythm ABDOMEN:  normoactive bowel sounds , non tender, not distended. Extremity: no pedal edema PSYCH: alert & oriented x 3 with fluent speech NEURO: no focal motor/sensory deficits   LABORATORY DATA:  I have reviewed the data as listed.    Latest Ref Rng & Units 11/21/2023    1:05 PM 10/23/2023   12:58 PM 09/25/2023   12:07 PM  CBC  WBC 4.0 - 10.5 K/uL 10.1  6.0  7.3   Hemoglobin 13.0 - 17.0 g/dL 89.4  9.3  8.9   Hematocrit 39.0 - 52.0 % 31.1  27.5  25.9   Platelets 150 - 400 K/uL 233  262  261    .    Latest Ref Rng & Units 08/28/2023   12:31 PM 07/31/2023    1:28 PM 07/03/2023   12:28 PM  CMP  Glucose 70 - 99 mg/dL 867  873  892   BUN 8 - 23 mg/dL 17  13  15    Creatinine 0.61 - 1.24 mg/dL 8.81  8.91  8.94   Sodium 135 - 145 mmol/L 142  140  144   Potassium 3.5 - 5.1 mmol/L 3.9  3.5  3.0   Chloride 98 - 111 mmol/L 109  106  108   CO2 22 - 32 mmol/L 26  26  25    Calcium  8.9 - 10.3 mg/dL 8.9  9.0  9.1   Total Protein 6.5 - 8.1 g/dL 5.9  6.3  6.0   Total Bilirubin 0.0 - 1.2 mg/dL 0.4  0.4  0.6   Alkaline Phos 38 - 126 U/L 55  54  53   AST 15 - 41 U/L 12  14  13    ALT 0 - 44 U/L 8  9  9  Component     Latest Ref Rng 07/31/2023  Iron     45 - 182 ug/dL 48   TIBC     749 - 549 ug/dL 769 (L)   Saturation Ratios     17.9 - 39.5 % 21   UIBC     117 - 376 ug/dL 817   Ferritin     24 - 336 ng/mL 1,299 (H)   Vitamin B12     180 - 914 pg/mL 451     Legend: (L) Low (H) High    Bone Marrow  Biopsy 09/29/2022:    RADIOGRAPHIC STUDIES: I have personally reviewed the radiological images as listed and agreed with the findings in the report. No results found.  ASSESSMENT & PLAN:   73 year old legally blind gentleman with significant functional limitations and limited social support presented with who was newly diagnosed with multiple myeloma and of May 2024 and was set up for chemo counseling to start daratumumab  dexamethasone  Velcade  and Xgeva and orders were placed and the patient was to get a PET scan but was lost to follow-up.  He had also been referred to social work at that time to help address his barriers of care. He is now admitted with progressive multiple myeloma.  1. Multiple myeloma, IgG kappa, diagnosed May 2024-untreated since patient did not follow-up now admitted with progressive myeloma with severe anemia and progressive renal failure.  CTs 12/04/2022-innumerable lytic lesions, large expansile lesion in the anterior right second rib, multiple pathologic rib fractures, expansile T4 (T3 on CT cervical spine) mass with soft tissue component extending into the central canal and left neural foramen, C7 transverse process fracture  Decadron  daily x 4 starting 12/05/2022  Cycle 1 day 1 Velcade  12/07/2022  Decadron  daily x 4 starting 12/12/2022  Cycle 1 day 8 Velcade  12/14/2022 2.  Acute/chronic renal failure 3.  Severe anemia 4.  Mild thrombocytopenia 5.  Diabetes 6.  Hypertension 7.  Glaucoma 8.  OSA  PLAN:  -Discussed lab results on 11/21/2023 in detail with patient. CBC showed WBC of 10.1K, hemoglobin of 10.5, and platelets of 233K. -Hgb improved  -his last myeloma lab from 1 month ago still showed no detectable M protein  -K/L light chains were normal with normal ratio -Patient notes no new toxicities from the monthly daratumumab  faspro -Continue maintenance daratumumab  once a month  FOLLOW-UP: Per integrated scheduling  The total time spent in the appointment was  25 minutes* .  All of the patient's questions were answered with apparent satisfaction. The patient knows to call the clinic with any problems, questions or concerns.   Emaline Saran MD MS AAHIVMS [facility] Hematology/Oncology Physician [facility]  .*Total Encounter Time as defined by the Centers for Medicare and Medicaid Services includes, in addition to the face-to-face time of a patient visit (documented in the note above) non-face-to-face time: obtaining and reviewing outside history, ordering and reviewing medications, tests or procedures, care coordination (communications with other health care professionals or caregivers) and documentation in the medical record.    I,Mitra Faeizi,acting as a Neurosurgeon for Emaline Saran, MD.,have documented all relevant documentation on the behalf of Emaline Saran, MD,as directed by  Emaline Saran, MD while in the presence of Emaline Saran, MD.  .I have reviewed the above documentation for accuracy and completeness, and I agree with the above. .Nature Kueker Kishore Amaia Lavallie MD

## 2023-11-21 ENCOUNTER — Encounter: Payer: Self-pay | Admitting: Hematology

## 2023-11-21 ENCOUNTER — Inpatient Hospital Stay: Attending: Hematology

## 2023-11-21 ENCOUNTER — Inpatient Hospital Stay (HOSPITAL_BASED_OUTPATIENT_CLINIC_OR_DEPARTMENT_OTHER): Admitting: Hematology

## 2023-11-21 ENCOUNTER — Inpatient Hospital Stay: Payer: Self-pay

## 2023-11-21 ENCOUNTER — Other Ambulatory Visit: Payer: Self-pay | Admitting: Hematology

## 2023-11-21 VITALS — BP 158/85 | HR 99 | Temp 98.6°F | Resp 18

## 2023-11-21 DIAGNOSIS — D696 Thrombocytopenia, unspecified: Secondary | ICD-10-CM | POA: Insufficient documentation

## 2023-11-21 DIAGNOSIS — W07XXXA Fall from chair, initial encounter: Secondary | ICD-10-CM | POA: Insufficient documentation

## 2023-11-21 DIAGNOSIS — R1030 Lower abdominal pain, unspecified: Secondary | ICD-10-CM | POA: Diagnosis not present

## 2023-11-21 DIAGNOSIS — Z833 Family history of diabetes mellitus: Secondary | ICD-10-CM | POA: Insufficient documentation

## 2023-11-21 DIAGNOSIS — Z79620 Long term (current) use of immunosuppressive biologic: Secondary | ICD-10-CM | POA: Insufficient documentation

## 2023-11-21 DIAGNOSIS — Z8249 Family history of ischemic heart disease and other diseases of the circulatory system: Secondary | ICD-10-CM | POA: Diagnosis not present

## 2023-11-21 DIAGNOSIS — Z5941 Food insecurity: Secondary | ICD-10-CM | POA: Insufficient documentation

## 2023-11-21 DIAGNOSIS — H409 Unspecified glaucoma: Secondary | ICD-10-CM | POA: Diagnosis not present

## 2023-11-21 DIAGNOSIS — H548 Legal blindness, as defined in USA: Secondary | ICD-10-CM | POA: Diagnosis not present

## 2023-11-21 DIAGNOSIS — Z7189 Other specified counseling: Secondary | ICD-10-CM

## 2023-11-21 DIAGNOSIS — Z5112 Encounter for antineoplastic immunotherapy: Secondary | ICD-10-CM | POA: Diagnosis present

## 2023-11-21 DIAGNOSIS — M25552 Pain in left hip: Secondary | ICD-10-CM | POA: Diagnosis not present

## 2023-11-21 DIAGNOSIS — R634 Abnormal weight loss: Secondary | ICD-10-CM | POA: Diagnosis not present

## 2023-11-21 DIAGNOSIS — R0789 Other chest pain: Secondary | ICD-10-CM | POA: Diagnosis not present

## 2023-11-21 DIAGNOSIS — R110 Nausea: Secondary | ICD-10-CM | POA: Diagnosis not present

## 2023-11-21 DIAGNOSIS — R410 Disorientation, unspecified: Secondary | ICD-10-CM | POA: Diagnosis not present

## 2023-11-21 DIAGNOSIS — Z803 Family history of malignant neoplasm of breast: Secondary | ICD-10-CM | POA: Diagnosis not present

## 2023-11-21 DIAGNOSIS — D649 Anemia, unspecified: Secondary | ICD-10-CM | POA: Diagnosis not present

## 2023-11-21 DIAGNOSIS — E1122 Type 2 diabetes mellitus with diabetic chronic kidney disease: Secondary | ICD-10-CM | POA: Insufficient documentation

## 2023-11-21 DIAGNOSIS — G4733 Obstructive sleep apnea (adult) (pediatric): Secondary | ICD-10-CM | POA: Diagnosis not present

## 2023-11-21 DIAGNOSIS — E7800 Pure hypercholesterolemia, unspecified: Secondary | ICD-10-CM | POA: Diagnosis not present

## 2023-11-21 DIAGNOSIS — C9000 Multiple myeloma not having achieved remission: Secondary | ICD-10-CM | POA: Insufficient documentation

## 2023-11-21 DIAGNOSIS — Z79899 Other long term (current) drug therapy: Secondary | ICD-10-CM | POA: Diagnosis not present

## 2023-11-21 DIAGNOSIS — N189 Chronic kidney disease, unspecified: Secondary | ICD-10-CM | POA: Insufficient documentation

## 2023-11-21 LAB — CBC WITH DIFFERENTIAL (CANCER CENTER ONLY)
Abs Immature Granulocytes: 0.03 K/uL (ref 0.00–0.07)
Basophils Absolute: 0 K/uL (ref 0.0–0.1)
Basophils Relative: 0 %
Eosinophils Absolute: 0 K/uL (ref 0.0–0.5)
Eosinophils Relative: 0 %
HCT: 31.1 % — ABNORMAL LOW (ref 39.0–52.0)
Hemoglobin: 10.5 g/dL — ABNORMAL LOW (ref 13.0–17.0)
Immature Granulocytes: 0 %
Lymphocytes Relative: 14 %
Lymphs Abs: 1.4 K/uL (ref 0.7–4.0)
MCH: 31.7 pg (ref 26.0–34.0)
MCHC: 33.8 g/dL (ref 30.0–36.0)
MCV: 94 fL (ref 80.0–100.0)
Monocytes Absolute: 0.9 K/uL (ref 0.1–1.0)
Monocytes Relative: 9 %
Neutro Abs: 7.7 K/uL (ref 1.7–7.7)
Neutrophils Relative %: 77 %
Platelet Count: 233 K/uL (ref 150–400)
RBC: 3.31 MIL/uL — ABNORMAL LOW (ref 4.22–5.81)
RDW: 13.9 % (ref 11.5–15.5)
WBC Count: 10.1 K/uL (ref 4.0–10.5)
nRBC: 0 % (ref 0.0–0.2)

## 2023-11-21 MED ORDER — FAMOTIDINE 20 MG PO TABS
20.0000 mg | ORAL_TABLET | Freq: Once | ORAL | Status: AC
  Administered 2023-11-21: 20 mg via ORAL
  Filled 2023-11-21: qty 1

## 2023-11-21 MED ORDER — ACETAMINOPHEN 325 MG PO TABS
650.0000 mg | ORAL_TABLET | Freq: Once | ORAL | Status: AC
  Administered 2023-11-21: 650 mg via ORAL
  Filled 2023-11-21: qty 2

## 2023-11-21 MED ORDER — DIPHENHYDRAMINE HCL 25 MG PO CAPS
50.0000 mg | ORAL_CAPSULE | Freq: Once | ORAL | Status: AC
Start: 2023-11-21 — End: 2023-11-21
  Administered 2023-11-21: 50 mg via ORAL
  Filled 2023-11-21: qty 2

## 2023-11-21 MED ORDER — DEXAMETHASONE 4 MG PO TABS
12.0000 mg | ORAL_TABLET | Freq: Once | ORAL | Status: AC
Start: 2023-11-21 — End: 2023-11-21
  Administered 2023-11-21: 12 mg via ORAL
  Filled 2023-11-21: qty 3

## 2023-11-21 MED ORDER — DARATUMUMAB-HYALURONIDASE-FIHJ 1800-30000 MG-UT/15ML ~~LOC~~ SOLN
1800.0000 mg | Freq: Once | SUBCUTANEOUS | Status: AC
  Administered 2023-11-21: 1800 mg via SUBCUTANEOUS
  Filled 2023-11-21: qty 15

## 2023-11-21 NOTE — Patient Instructions (Signed)
 [facility] - A DEPT OF [facility]  Discharge Instructions: Thank you for choosing [facility] to provide your oncology and hematology care.   If you have a lab appointment with the Cancer Center, please go directly to the Cancer Center and check in at the registration area.   Wear comfortable clothing and clothing appropriate for easy access to any Portacath or PICC line.   We strive to give you quality time with your provider. You may need to reschedule your appointment if you arrive late (15 or more minutes).  Arriving late affects you and other patients whose appointments are after yours.  Also, if you miss three or more appointments without notifying the office, you may be dismissed from the clinic at the provider's discretion.      For prescription refill requests, have your pharmacy contact our office and allow 72 hours for refills to be completed.    Today you received the following chemotherapy and/or immunotherapy agents Darzalex faspro      To help prevent nausea and vomiting after your treatment, we encourage you to take your nausea medication as directed.  BELOW ARE SYMPTOMS THAT SHOULD BE REPORTED IMMEDIATELY: *FEVER GREATER THAN 100.4 F (38 C) OR HIGHER *CHILLS OR SWEATING *NAUSEA AND VOMITING THAT IS NOT CONTROLLED WITH YOUR NAUSEA MEDICATION *UNUSUAL SHORTNESS OF BREATH *UNUSUAL BRUISING OR BLEEDING *URINARY PROBLEMS (pain or burning when urinating, or frequent urination) *BOWEL PROBLEMS (unusual diarrhea, constipation, pain near the anus) TENDERNESS IN MOUTH AND THROAT WITH OR WITHOUT PRESENCE OF ULCERS (sore throat, sores in mouth, or a toothache) UNUSUAL RASH, SWELLING OR PAIN  UNUSUAL VAGINAL DISCHARGE OR ITCHING   Items with * indicate a potential emergency and should be followed up as soon as possible or go to the Emergency Department if any problems should occur.  Please show the CHEMOTHERAPY ALERT CARD or  IMMUNOTHERAPY ALERT CARD at check-in to the Emergency Department and triage nurse.  Should you have questions after your visit or need to cancel or reschedule your appointment, please contact [facility] - A DEPT OF [facility]  Dept: [phone]  and follow the prompts.  Office hours are 8:00 a.m. to 4:30 p.m. Monday - Friday. Please note that voicemails left after 4:00 p.m. may not be returned until the following business day.  We are closed weekends and major holidays. You have access to a nurse at all times for urgent questions. Please call the main number to the clinic Dept: [phone] and follow the prompts.   For any non-urgent questions, you may also contact your provider using MyChart. We now offer e-Visits for anyone 40 and older to request care online for non-urgent symptoms. For details visit [email or web address].   Also download the MyChart app! Go to the app store, search "MyChart", open the app, select [place], and log in with your MyChart username and password.

## 2023-11-22 ENCOUNTER — Other Ambulatory Visit: Payer: Self-pay | Admitting: Hematology and Oncology

## 2023-11-22 DIAGNOSIS — C9000 Multiple myeloma not having achieved remission: Secondary | ICD-10-CM

## 2023-11-22 DIAGNOSIS — Z7189 Other specified counseling: Secondary | ICD-10-CM

## 2023-11-22 LAB — KAPPA/LAMBDA LIGHT CHAINS
Kappa free light chain: 18.6 mg/L (ref 3.3–19.4)
Kappa, lambda light chain ratio: 1.65 (ref 0.26–1.65)
Lambda free light chains: 11.3 mg/L (ref 5.7–26.3)

## 2023-11-23 LAB — MULTIPLE MYELOMA PANEL, SERUM
Albumin SerPl Elph-Mcnc: 3.6 g/dL (ref 2.9–4.4)
Albumin/Glob SerPl: 1.4 (ref 0.7–1.7)
Alpha 1: 0.2 g/dL (ref 0.0–0.4)
Alpha2 Glob SerPl Elph-Mcnc: 0.6 g/dL (ref 0.4–1.0)
B-Globulin SerPl Elph-Mcnc: 0.9 g/dL (ref 0.7–1.3)
Gamma Glob SerPl Elph-Mcnc: 0.8 g/dL (ref 0.4–1.8)
Globulin, Total: 2.6 g/dL (ref 2.2–3.9)
IgA: 85 mg/dL (ref 61–437)
IgG (Immunoglobin G), Serum: 927 mg/dL (ref 603–1613)
IgM (Immunoglobulin M), Srm: 26 mg/dL (ref 15–143)
Total Protein ELP: 6.2 g/dL (ref 6.0–8.5)

## 2023-11-25 ENCOUNTER — Encounter: Payer: Self-pay | Admitting: Hematology

## 2023-12-18 ENCOUNTER — Inpatient Hospital Stay

## 2023-12-18 ENCOUNTER — Inpatient Hospital Stay: Attending: Hematology

## 2023-12-18 VITALS — BP 164/86 | HR 87 | Temp 98.1°F | Resp 18

## 2023-12-18 DIAGNOSIS — N189 Chronic kidney disease, unspecified: Secondary | ICD-10-CM | POA: Diagnosis not present

## 2023-12-18 DIAGNOSIS — Z8249 Family history of ischemic heart disease and other diseases of the circulatory system: Secondary | ICD-10-CM | POA: Insufficient documentation

## 2023-12-18 DIAGNOSIS — C9000 Multiple myeloma not having achieved remission: Secondary | ICD-10-CM

## 2023-12-18 DIAGNOSIS — Z833 Family history of diabetes mellitus: Secondary | ICD-10-CM | POA: Diagnosis not present

## 2023-12-18 DIAGNOSIS — I10 Essential (primary) hypertension: Secondary | ICD-10-CM | POA: Insufficient documentation

## 2023-12-18 DIAGNOSIS — H409 Unspecified glaucoma: Secondary | ICD-10-CM | POA: Insufficient documentation

## 2023-12-18 DIAGNOSIS — Z79620 Long term (current) use of immunosuppressive biologic: Secondary | ICD-10-CM | POA: Insufficient documentation

## 2023-12-18 DIAGNOSIS — Z803 Family history of malignant neoplasm of breast: Secondary | ICD-10-CM | POA: Diagnosis not present

## 2023-12-18 DIAGNOSIS — E119 Type 2 diabetes mellitus without complications: Secondary | ICD-10-CM | POA: Diagnosis not present

## 2023-12-18 DIAGNOSIS — D649 Anemia, unspecified: Secondary | ICD-10-CM | POA: Diagnosis not present

## 2023-12-18 DIAGNOSIS — Z7189 Other specified counseling: Secondary | ICD-10-CM

## 2023-12-18 DIAGNOSIS — Z79899 Other long term (current) drug therapy: Secondary | ICD-10-CM | POA: Diagnosis not present

## 2023-12-18 DIAGNOSIS — Z5112 Encounter for antineoplastic immunotherapy: Secondary | ICD-10-CM | POA: Insufficient documentation

## 2023-12-18 LAB — CBC WITH DIFFERENTIAL (CANCER CENTER ONLY)
Abs Immature Granulocytes: 0.02 K/uL (ref 0.00–0.07)
Basophils Absolute: 0 K/uL (ref 0.0–0.1)
Basophils Relative: 1 %
Eosinophils Absolute: 0.1 K/uL (ref 0.0–0.5)
Eosinophils Relative: 2 %
HCT: 30.9 % — ABNORMAL LOW (ref 39.0–52.0)
Hemoglobin: 10.4 g/dL — ABNORMAL LOW (ref 13.0–17.0)
Immature Granulocytes: 0 %
Lymphocytes Relative: 23 %
Lymphs Abs: 1.6 K/uL (ref 0.7–4.0)
MCH: 31.6 pg (ref 26.0–34.0)
MCHC: 33.7 g/dL (ref 30.0–36.0)
MCV: 93.9 fL (ref 80.0–100.0)
Monocytes Absolute: 0.5 K/uL (ref 0.1–1.0)
Monocytes Relative: 7 %
Neutro Abs: 4.5 K/uL (ref 1.7–7.7)
Neutrophils Relative %: 67 %
Platelet Count: 217 K/uL (ref 150–400)
RBC: 3.29 MIL/uL — ABNORMAL LOW (ref 4.22–5.81)
RDW: 14.6 % (ref 11.5–15.5)
WBC Count: 6.7 K/uL (ref 4.0–10.5)
nRBC: 0 % (ref 0.0–0.2)

## 2023-12-18 MED ORDER — FAMOTIDINE 20 MG PO TABS
20.0000 mg | ORAL_TABLET | Freq: Once | ORAL | Status: AC
  Administered 2023-12-18 (×2): 20 mg via ORAL
  Filled 2023-12-18: qty 1

## 2023-12-18 MED ORDER — ACETAMINOPHEN 325 MG PO TABS
650.0000 mg | ORAL_TABLET | Freq: Once | ORAL | Status: AC
  Administered 2023-12-18 (×2): 650 mg via ORAL
  Filled 2023-12-18: qty 2

## 2023-12-18 MED ORDER — DEXAMETHASONE 4 MG PO TABS
12.0000 mg | ORAL_TABLET | Freq: Once | ORAL | Status: AC
  Administered 2023-12-18 (×2): 12 mg via ORAL
  Filled 2023-12-18: qty 3

## 2023-12-18 MED ORDER — DIPHENHYDRAMINE HCL 25 MG PO CAPS
50.0000 mg | ORAL_CAPSULE | Freq: Once | ORAL | Status: AC
  Administered 2023-12-18 (×2): 50 mg via ORAL
  Filled 2023-12-18: qty 2

## 2023-12-18 MED ORDER — DARATUMUMAB-HYALURONIDASE-FIHJ 1800-30000 MG-UT/15ML ~~LOC~~ SOLN
1800.0000 mg | Freq: Once | SUBCUTANEOUS | Status: AC
  Administered 2023-12-18 (×2): 1800 mg via SUBCUTANEOUS
  Filled 2023-12-18: qty 15

## 2023-12-18 NOTE — Patient Instructions (Signed)
 [facility] - A DEPT OF [facility]  Discharge Instructions: Thank you for choosing [facility] to provide your oncology and hematology care.   If you have a lab appointment with the Cancer Center, please go directly to the Cancer Center and check in at the registration area.   Wear comfortable clothing and clothing appropriate for easy access to any Portacath or PICC line.   We strive to give you quality time with your provider. You may need to reschedule your appointment if you arrive late (15 or more minutes).  Arriving late affects you and other patients whose appointments are after yours.  Also, if you miss three or more appointments without notifying the office, you may be dismissed from the clinic at the provider's discretion.      For prescription refill requests, have your pharmacy contact our office and allow 72 hours for refills to be completed.    Today you received the following chemotherapy and/or immunotherapy agents: Dara Faspro.       To help prevent nausea and vomiting after your treatment, we encourage you to take your nausea medication as directed.  BELOW ARE SYMPTOMS THAT SHOULD BE REPORTED IMMEDIATELY: *FEVER GREATER THAN 100.4 F (38 C) OR HIGHER *CHILLS OR SWEATING *NAUSEA AND VOMITING THAT IS NOT CONTROLLED WITH YOUR NAUSEA MEDICATION *UNUSUAL SHORTNESS OF BREATH *UNUSUAL BRUISING OR BLEEDING *URINARY PROBLEMS (pain or burning when urinating, or frequent urination) *BOWEL PROBLEMS (unusual diarrhea, constipation, pain near the anus) TENDERNESS IN MOUTH AND THROAT WITH OR WITHOUT PRESENCE OF ULCERS (sore throat, sores in mouth, or a toothache) UNUSUAL RASH, SWELLING OR PAIN  UNUSUAL VAGINAL DISCHARGE OR ITCHING   Items with * indicate a potential emergency and should be followed up as soon as possible or go to the Emergency Department if any problems should occur.  Please show the CHEMOTHERAPY ALERT CARD or  IMMUNOTHERAPY ALERT CARD at check-in to the Emergency Department and triage nurse.  Should you have questions after your visit or need to cancel or reschedule your appointment, please contact [facility] - A DEPT [facility]  Dept: [phone]  and follow the prompts.  Office hours are 8:00 a.m. to 4:30 p.m. Monday - Friday. Please note that voicemails left after 4:00 p.m. may not be returned until the following business day.  We are closed weekends and major holidays. You have access to a nurse at all times for urgent questions. Please call the main number to the clinic Dept: [phone] and follow the prompts.   For any non-urgent questions, you may also contact your provider using MyChart. We now offer e-Visits for anyone 12 and older to request care online for non-urgent symptoms. For details visit [email or web address].   Also download the MyChart app! Go to the app store, search "MyChart", open the app, select , and log in with your MyChart username and password.

## 2023-12-19 ENCOUNTER — Other Ambulatory Visit

## 2023-12-19 LAB — KAPPA/LAMBDA LIGHT CHAINS
Kappa free light chain: 17.2 mg/L (ref 3.3–19.4)
Kappa, lambda light chain ratio: 1.54 (ref 0.26–1.65)
Lambda free light chains: 11.2 mg/L (ref 5.7–26.3)

## 2023-12-21 LAB — MULTIPLE MYELOMA PANEL, SERUM
Albumin SerPl Elph-Mcnc: 3.5 g/dL (ref 2.9–4.4)
Albumin/Glob SerPl: 1.4 (ref 0.7–1.7)
Alpha 1: 0.2 g/dL (ref 0.0–0.4)
Alpha2 Glob SerPl Elph-Mcnc: 0.7 g/dL (ref 0.4–1.0)
B-Globulin SerPl Elph-Mcnc: 0.9 g/dL (ref 0.7–1.3)
Gamma Glob SerPl Elph-Mcnc: 0.8 g/dL (ref 0.4–1.8)
Globulin, Total: 2.6 g/dL (ref 2.2–3.9)
IgA: 90 mg/dL (ref 61–437)
IgG (Immunoglobin G), Serum: 892 mg/dL (ref 603–1613)
IgM (Immunoglobulin M), Srm: 24 mg/dL (ref 15–143)
Total Protein ELP: 6.1 g/dL (ref 6.0–8.5)

## 2024-01-09 ENCOUNTER — Encounter: Payer: Self-pay | Admitting: Hematology

## 2024-01-16 ENCOUNTER — Inpatient Hospital Stay: Attending: Hematology | Admitting: Hematology

## 2024-01-16 ENCOUNTER — Inpatient Hospital Stay

## 2024-01-16 VITALS — BP 150/77 | HR 91 | Temp 99.0°F | Resp 18 | Wt 134.0 lb

## 2024-01-16 DIAGNOSIS — I10 Essential (primary) hypertension: Secondary | ICD-10-CM | POA: Insufficient documentation

## 2024-01-16 DIAGNOSIS — Z79899 Other long term (current) drug therapy: Secondary | ICD-10-CM | POA: Diagnosis not present

## 2024-01-16 DIAGNOSIS — Z79620 Long term (current) use of immunosuppressive biologic: Secondary | ICD-10-CM | POA: Insufficient documentation

## 2024-01-16 DIAGNOSIS — C9000 Multiple myeloma not having achieved remission: Secondary | ICD-10-CM | POA: Insufficient documentation

## 2024-01-16 DIAGNOSIS — E119 Type 2 diabetes mellitus without complications: Secondary | ICD-10-CM | POA: Diagnosis not present

## 2024-01-16 DIAGNOSIS — Z7189 Other specified counseling: Secondary | ICD-10-CM | POA: Diagnosis not present

## 2024-01-16 DIAGNOSIS — Z5112 Encounter for antineoplastic immunotherapy: Secondary | ICD-10-CM | POA: Diagnosis present

## 2024-01-16 LAB — CBC WITH DIFFERENTIAL (CANCER CENTER ONLY)
Abs Immature Granulocytes: 0.01 K/uL (ref 0.00–0.07)
Basophils Absolute: 0 K/uL (ref 0.0–0.1)
Basophils Relative: 1 %
Eosinophils Absolute: 0.1 K/uL (ref 0.0–0.5)
Eosinophils Relative: 1 %
HCT: 31.8 % — ABNORMAL LOW (ref 39.0–52.0)
Hemoglobin: 10.6 g/dL — ABNORMAL LOW (ref 13.0–17.0)
Immature Granulocytes: 0 %
Lymphocytes Relative: 21 %
Lymphs Abs: 1.4 K/uL (ref 0.7–4.0)
MCH: 30.9 pg (ref 26.0–34.0)
MCHC: 33.3 g/dL (ref 30.0–36.0)
MCV: 92.7 fL (ref 80.0–100.0)
Monocytes Absolute: 0.5 K/uL (ref 0.1–1.0)
Monocytes Relative: 7 %
Neutro Abs: 4.7 K/uL (ref 1.7–7.7)
Neutrophils Relative %: 70 %
Platelet Count: 221 K/uL (ref 150–400)
RBC: 3.43 MIL/uL — ABNORMAL LOW (ref 4.22–5.81)
RDW: 14.6 % (ref 11.5–15.5)
WBC Count: 6.6 K/uL (ref 4.0–10.5)
nRBC: 0 % (ref 0.0–0.2)

## 2024-01-16 MED ORDER — DIPHENHYDRAMINE HCL 25 MG PO CAPS
50.0000 mg | ORAL_CAPSULE | Freq: Once | ORAL | Status: AC
  Administered 2024-01-16: 50 mg via ORAL
  Filled 2024-01-16: qty 2

## 2024-01-16 MED ORDER — DARATUMUMAB-HYALURONIDASE-FIHJ 1800-30000 MG-UT/15ML ~~LOC~~ SOLN
1800.0000 mg | Freq: Once | SUBCUTANEOUS | Status: AC
  Administered 2024-01-16: 1800 mg via SUBCUTANEOUS
  Filled 2024-01-16: qty 15

## 2024-01-16 MED ORDER — FAMOTIDINE 20 MG PO TABS
20.0000 mg | ORAL_TABLET | Freq: Once | ORAL | Status: AC
  Administered 2024-01-16: 20 mg via ORAL
  Filled 2024-01-16: qty 1

## 2024-01-16 MED ORDER — DEXAMETHASONE 4 MG PO TABS
12.0000 mg | ORAL_TABLET | Freq: Once | ORAL | Status: AC
  Administered 2024-01-16: 12 mg via ORAL
  Filled 2024-01-16: qty 3

## 2024-01-16 MED ORDER — ACETAMINOPHEN 325 MG PO TABS
650.0000 mg | ORAL_TABLET | Freq: Once | ORAL | Status: AC
  Administered 2024-01-16: 650 mg via ORAL
  Filled 2024-01-16: qty 2

## 2024-01-16 NOTE — Patient Instructions (Signed)
 [facility] - A DEPT OF [facility]  Discharge Instructions: Thank you for choosing [facility] to provide your oncology and hematology care.   If you have a lab appointment with the Cancer Center, please go directly to the Cancer Center and check in at the registration area.   Wear comfortable clothing and clothing appropriate for easy access to any Portacath or PICC line.   We strive to give you quality time with your provider. You may need to reschedule your appointment if you arrive late (15 or more minutes).  Arriving late affects you and other patients whose appointments are after yours.  Also, if you miss three or more appointments without notifying the office, you may be dismissed from the clinic at the provider's discretion.      For prescription refill requests, have your pharmacy contact our office and allow 72 hours for refills to be completed.    Today you received the following chemotherapy and/or immunotherapy agents Darzalex faspro      To help prevent nausea and vomiting after your treatment, we encourage you to take your nausea medication as directed.  BELOW ARE SYMPTOMS THAT SHOULD BE REPORTED IMMEDIATELY: *FEVER GREATER THAN 100.4 F (38 C) OR HIGHER *CHILLS OR SWEATING *NAUSEA AND VOMITING THAT IS NOT CONTROLLED WITH YOUR NAUSEA MEDICATION *UNUSUAL SHORTNESS OF BREATH *UNUSUAL BRUISING OR BLEEDING *URINARY PROBLEMS (pain or burning when urinating, or frequent urination) *BOWEL PROBLEMS (unusual diarrhea, constipation, pain near the anus) TENDERNESS IN MOUTH AND THROAT WITH OR WITHOUT PRESENCE OF ULCERS (sore throat, sores in mouth, or a toothache) UNUSUAL RASH, SWELLING OR PAIN  UNUSUAL VAGINAL DISCHARGE OR ITCHING   Items with * indicate a potential emergency and should be followed up as soon as possible or go to the Emergency Department if any problems should occur.  Please show the CHEMOTHERAPY ALERT CARD or  IMMUNOTHERAPY ALERT CARD at check-in to the Emergency Department and triage nurse.  Should you have questions after your visit or need to cancel or reschedule your appointment, please contact [facility] - A DEPT OF [facility]  Dept: [phone]  and follow the prompts.  Office hours are 8:00 a.m. to 4:30 p.m. Monday - Friday. Please note that voicemails left after 4:00 p.m. may not be returned until the following business day.  We are closed weekends and major holidays. You have access to a nurse at all times for urgent questions. Please call the main number to the clinic Dept: [phone] and follow the prompts.   For any non-urgent questions, you may also contact your provider using MyChart. We now offer e-Visits for anyone 40 and older to request care online for non-urgent symptoms. For details visit [email or web address].   Also download the MyChart app! Go to the app store, search "MyChart", open the app, select [place], and log in with your MyChart username and password.

## 2024-01-17 LAB — KAPPA/LAMBDA LIGHT CHAINS
Kappa free light chain: 16.3 mg/L (ref 3.3–19.4)
Kappa, lambda light chain ratio: 1.51 (ref 0.26–1.65)
Lambda free light chains: 10.8 mg/L (ref 5.7–26.3)

## 2024-01-18 ENCOUNTER — Other Ambulatory Visit: Payer: Self-pay

## 2024-01-21 LAB — MULTIPLE MYELOMA PANEL, SERUM
Albumin SerPl Elph-Mcnc: 3.3 g/dL (ref 2.9–4.4)
Albumin/Glob SerPl: 1.3 (ref 0.7–1.7)
Alpha 1: 0.2 g/dL (ref 0.0–0.4)
Alpha2 Glob SerPl Elph-Mcnc: 0.7 g/dL (ref 0.4–1.0)
B-Globulin SerPl Elph-Mcnc: 0.9 g/dL (ref 0.7–1.3)
Gamma Glob SerPl Elph-Mcnc: 0.8 g/dL (ref 0.4–1.8)
Globulin, Total: 2.6 g/dL (ref 2.2–3.9)
IgA: 82 mg/dL (ref 61–437)
IgG (Immunoglobin G), Serum: 825 mg/dL (ref 603–1613)
IgM (Immunoglobulin M), Srm: 27 mg/dL (ref 15–143)
Total Protein ELP: 5.9 g/dL — ABNORMAL LOW (ref 6.0–8.5)

## 2024-01-23 ENCOUNTER — Encounter: Payer: Self-pay | Admitting: Hematology

## 2024-01-23 NOTE — Progress Notes (Signed)
 HEMATOLOGY/ONCOLOGY CLINIC NOTE  Date of Service: .01/16/2024   Patient Care Team: Pura Lenis, MD as PCP - General (Family Medicine)  CHIEF COMPLAINTS/PURPOSE OF CONSULTATION:  Follow-up for continued evaluation and management of multiple myeloma  HISTORY OF PRESENTING ILLNESS:  Scott Bass is a wonderful 73 y.o. male who has been referred to us  by Pura Lenis, MD for evaluation and management of multiple myeloma.  Patient was in ED for symptomatic anemia on 09/08/2022. Patient presented to the ED and was hospitalized on 09/25/2022 for acute renal failure.   Today, he presents in a wheelchair and is accompanied by his wife. He reports that he is intermittently nauseous. He does not take anything to manage nausea. He denies any acid reflux symptoms.  He does have mild abdominal pain. He has had one bowel movement this week and does endorse a cycle of constipation and diarrhea. He reports recent poor p.o intake due to loss of appetite over the last month. He reports a weight loss of 5-6 pounds. He denies any leg swelling, radiation exposure, or neuropathy.  He reports that receiving blood transfusions during his hospitalization did improve energy levels. He was given Oxycodone  at the hospital which he has run out of. He currently uses Tylenol  for pain management.   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 Patient's wife is also blind. He reports that he is able to manage daily activities with his wife at home.   Patient suffered a fall from a chair after it broke. He reports worsened fatigue which began one month ago. He denies any syncope or lightheadedness/dizziness.  He complains of chest wall pain as well as left hip pain in the groin and laterally. This pain has improved.   His DM2 is fairly well-controlled. He reports that he needs a Metformin  refill. He has self-discontinued Jardiance  1-2 months ago as he thought that it would affect blood sugars levels. His blood  sugars at home have been fairly stable with its highest level at 145.   His wife reports that patient has frequent urination with a burning sensation. Patient's wife reports that his confusion has improved by 80%. Patient reports that he is occasionally confused. She reports that patient may need assistance with bedside commode. Patient reports that he hospital has initiated home care services. Patient reports that he does not have a nephrologist.  INTERVAL HISTORY:  Scott Bass is a 73 y.o. male who is here for continued evaluation and management of his multiple myeloma. He notes no acute new symptoms since his last clinic visit. No new focal bone pains.  Energy levels have been stable. Good p.o. intake.SABRA Spark his monthly daratumumab  Faspro without any notable toxicities. No infection issues.   MEDICAL HISTORY:  Past Medical History:  Diagnosis Date   Arthritis    lower back (12/18/2017)   Better eye: moderate vision impairment; lesser eye: blind    GERD (gastroesophageal reflux disease)    Glaucoma, both eyes    High cholesterol    Hypertension    Hypothyroidism    Irregular heartbeat    Legally blind    both eyes; can see some out of left eye   OSA (obstructive sleep apnea)    Thyroid disease    Type II diabetes mellitus (HCC)    Vitamin B12 deficiency     SURGICAL HISTORY: Past Surgical History:  Procedure Laterality Date   LEFT HEART CATH AND CORONARY ANGIOGRAPHY N/A 12/18/2017   Procedure: LEFT HEART CATH  AND CORONARY ANGIOGRAPHY;  Surgeon: Elmira Newman PARAS, MD;  Location: [facility];  Service: Cardiovascular;  Laterality: N/A;   NOSE SURGERY     was crooked; they straightened it out (12/18/2017)    SOCIAL HISTORY: Social History   Socioeconomic History   Marital status: Married    Spouse name: Not on file   Number of children: 0   Years of education: Not on file   Highest education level: Not on file  Occupational History    Occupation: retired  Tobacco Use   Smoking status: Never   Smokeless tobacco: Never  Vaping Use   Vaping status: Never Used  Substance and Sexual Activity   Alcohol use: Not Currently    Comment: occ   Drug use: Never   Sexual activity: Not Currently  Other Topics Concern   Not on file  Social History Narrative   Not on file   Social Drivers of Health   Financial Resource Strain: Low Risk  (10/10/2022)   Received from [facility]   Overall Financial Resource Strain (CARDIA)    Difficulty of Paying Living Expenses: Not very hard  Food Insecurity: Food Insecurity Present (07/03/2023)   Hunger Vital Sign    Worried About Running Out of Food in the Last Year: Sometimes true    Ran Out of Food in the Last Year: Sometimes true  Transportation Needs: No Transportation Needs (01/28/2023)   PRAPARE - Administrator, Civil Service (Medical): No    Lack of Transportation (Non-Medical): No  Physical Activity: Insufficiently Active (12/23/2020)   Received from [facility]   Exercise Vital Sign    On average, how many days per week do you engage in moderate to strenuous exercise (like a brisk walk)?: 2 days    On average, how many minutes do you engage in exercise at this level?: 10 min  Stress: No Stress Concern Present (12/23/2020)   Received from [facility] - Occupational Stress Questionnaire    Feeling of Stress : Not at all  Social Connections: Unknown (09/16/2021)   Received from [facility]   Social Network    Social Network: Not on file  Intimate Partner Violence: Not At Risk (01/28/2023)   Humiliation, Afraid, Rape, and Kick questionnaire    Fear of Current or Ex-Partner: No    Emotionally Abused: No    Physically Abused: No    Sexually Abused: No    FAMILY HISTORY: Family History  Problem Relation Age of Onset   Diabetes Mellitus II Mother 29   Diabetes Mellitus II Sister    Breast cancer Sister    Heart  failure Sister 86    ALLERGIES:  is allergic to hctz [hydrochlorothiazide].  MEDICATIONS:  Current Outpatient Medications  Medication Sig Dispense Refill   acetaminophen  (TYLENOL ) 500 MG tablet Take 2 tablets (1,000 mg total) by mouth every 8 (eight) hours as needed for moderate pain.     acyclovir  (ZOVIRAX ) 400 MG tablet Take 1 tablet (400 mg total) by mouth 2 (two) times daily. (Patient not taking: Reported on 02/27/2023) 60 tablet 5   brimonidine  (ALPHAGAN ) 0.2 % ophthalmic solution Place 1 drop into both eyes 2 (two) times daily.  0   latanoprost  (XALATAN ) 0.005 % ophthalmic solution Place 1 drop into both eyes every evening.     levothyroxine  (SYNTHROID , LEVOTHROID) 150 MCG tablet Take 150 mcg by mouth daily before breakfast.  0   metFORMIN  (GLUCOPHAGE ) 1000 MG  tablet Take 1 tablet (1,000 mg total) by mouth daily with breakfast.     midodrine  (PROAMATINE ) 2.5 MG tablet Take 1 tablet (2.5 mg total) by mouth 2 (two) times daily with a meal. (Patient taking differently: Take 2.5 mg by mouth in the morning.) 60 tablet 1   ondansetron  (ZOFRAN ) 4 MG tablet Take 1 tablet (4 mg total) by mouth every 8 (eight) hours as needed for nausea or vomiting. 20 tablet 2   pantoprazole  (PROTONIX ) 40 MG tablet Take 1 tablet (40 mg total) by mouth daily. (Patient not taking: Reported on 02/27/2023)     potassium chloride  SA (KLOR-CON  M) 20 MEQ tablet Take 1 tablet (20 mEq total) by mouth 2 (two) times daily. 30 tablet 0   rosuvastatin  (CRESTOR ) 10 MG tablet Take 10 mg by mouth at bedtime.     TUMS 500 MG chewable tablet Chew 1-2 tablets by mouth 2 (two) times daily as needed for indigestion or heartburn.     No current facility-administered medications for this visit.    REVIEW OF SYSTEMS:   .10 Point review of Systems was done is negative except as noted above.  PHYSICAL EXAMINATION: ECOG PERFORMANCE STATUS: 3 - Symptomatic, >50% confined to bed .VSS GENERAL:alert, in no acute distress and  comfortable SKIN: no acute rashes, no significant lesions EYES: conjunctiva are pink and non-injected, sclera anicteric OROPHARYNX: MMM, no exudates, no oropharyngeal erythema or ulceration NECK: supple, no JVD LYMPH:  no palpable lymphadenopathy in the cervical, axillary or inguinal regions LUNGS: clear to auscultation b/l with normal respiratory effort HEART: regular rate & rhythm ABDOMEN:  normoactive bowel sounds , non tender, not distended. Extremity: no pedal edema PSYCH: alert & oriented x 3 with fluent speech NEURO: no focal motor/sensory deficits   LABORATORY DATA:  I have reviewed the data as listed.    Latest Ref Rng & Units 01/16/2024    1:19 PM 12/18/2023    1:05 PM 11/21/2023    1:05 PM  CBC  WBC 4.0 - 10.5 K/uL 6.6  6.7  10.1   Hemoglobin 13.0 - 17.0 g/dL 89.3  89.5  89.4   Hematocrit 39.0 - 52.0 % 31.8  30.9  31.1   Platelets 150 - 400 K/uL 221  217  233    .    Latest Ref Rng & Units 08/28/2023   12:31 PM 07/31/2023    1:28 PM 07/03/2023   12:28 PM  CMP  Glucose 70 - 99 mg/dL 867  873  892   BUN 8 - 23 mg/dL 17  13  15    Creatinine 0.61 - 1.24 mg/dL 8.81  8.91  8.94   Sodium 135 - 145 mmol/L 142  140  144   Potassium 3.5 - 5.1 mmol/L 3.9  3.5  3.0   Chloride 98 - 111 mmol/L 109  106  108   CO2 22 - 32 mmol/L 26  26  25    Calcium  8.9 - 10.3 mg/dL 8.9  9.0  9.1   Total Protein 6.5 - 8.1 g/dL 5.9  6.3  6.0   Total Bilirubin 0.0 - 1.2 mg/dL 0.4  0.4  0.6   Alkaline Phos 38 - 126 U/L 55  54  53   AST 15 - 41 U/L 12  14  13    ALT 0 - 44 U/L 8  9  9     Component     Latest Ref Rng 07/31/2023  Iron     45 - 182 ug/dL 48  TIBC     250 - 450 ug/dL 769 (L)   Saturation Ratios     17.9 - 39.5 % 21   UIBC     117 - 376 ug/dL 817   Ferritin     24 - 336 ng/mL 1,299 (H)   Vitamin B12     180 - 914 pg/mL 451     Legend: (L) Low (H) High    Bone Marrow Biopsy 09/29/2022:    RADIOGRAPHIC STUDIES: I have personally reviewed the radiological images as  listed and agreed with the findings in the report. No results found.  ASSESSMENT & PLAN:   73 year old legally blind gentleman with significant functional limitations and limited social support presented with who was newly diagnosed with multiple myeloma and of May 2024 and was set up for chemo counseling to start daratumumab  dexamethasone  Velcade  and Xgeva and orders were placed and the patient was to get a PET scan but was lost to follow-up.  He had also been referred to social work at that time to help address his barriers of care. He is now admitted with progressive multiple myeloma.  1. Multiple myeloma, IgG kappa, diagnosed May 2024-untreated since patient did not follow-up now admitted with progressive myeloma with severe anemia and progressive renal failure.  CTs 12/04/2022-innumerable lytic lesions, large expansile lesion in the anterior right second rib, multiple pathologic rib fractures, expansile T4 (T3 on CT cervical spine) mass with soft tissue component extending into the central canal and left neural foramen, C7 transverse process fracture  Decadron  daily x 4 starting 12/05/2022  Cycle 1 day 1 Velcade  12/07/2022  Decadron  daily x 4 starting 12/12/2022  Cycle 1 day 8 Velcade  12/14/2022 2.  Acute/chronic renal failure 3.  Severe anemia 4.  Mild thrombocytopenia 5.  Diabetes 6.  Hypertension 7.  Glaucoma 8.  OSA  PLAN: Discussed lab results from today with the patient in details CBC shows stable hemoglobin of 10.6 with normal WBC count and platelets Myeloma panel shows no detectable monoclonal protein spike and negative IFE Serum kappa lambda free light chains within normal limits with normal kappa lambda ratio Patient has no lab or clinical evidence of myeloma progression at this time. He is tolerating his monthly daratumumab  Faspro without any acute issues. Monthly daratumumab  with the same supportive medications. Patient still needs contact isolation due to concerns with  persistent bedbug infestation at home.  FOLLOW-UP: Per integrated scheduling  The total time spent in the appointment was 30 minutes*.  All of the patient's questions were answered with apparent satisfaction. The patient knows to call the clinic with any problems, questions or concerns.   Emaline Saran MD MS AAHIVMS [facility] Hematology/Oncology Physician [facility]  .*Total Encounter Time as defined by the Centers for Medicare and Medicaid Services includes, in addition to the face-to-face time of a patient visit (documented in the note above) non-face-to-face time: obtaining and reviewing outside history, ordering and reviewing medications, tests or procedures, care coordination (communications with other health care professionals or caregivers) and documentation in the medical record.

## 2024-01-29 ENCOUNTER — Encounter: Payer: Self-pay | Admitting: Hematology

## 2024-02-12 ENCOUNTER — Inpatient Hospital Stay

## 2024-02-12 ENCOUNTER — Inpatient Hospital Stay: Attending: Hematology

## 2024-02-12 VITALS — BP 154/82 | HR 76 | Temp 97.7°F | Resp 18

## 2024-02-12 DIAGNOSIS — Z7189 Other specified counseling: Secondary | ICD-10-CM

## 2024-02-12 DIAGNOSIS — C9000 Multiple myeloma not having achieved remission: Secondary | ICD-10-CM | POA: Insufficient documentation

## 2024-02-12 DIAGNOSIS — Z79620 Long term (current) use of immunosuppressive biologic: Secondary | ICD-10-CM | POA: Diagnosis not present

## 2024-02-12 DIAGNOSIS — Z5112 Encounter for antineoplastic immunotherapy: Secondary | ICD-10-CM | POA: Diagnosis present

## 2024-02-12 LAB — CMP (CANCER CENTER ONLY)
ALT: 12 U/L (ref 0–44)
AST: 15 U/L (ref 15–41)
Albumin: 4 g/dL (ref 3.5–5.0)
Alkaline Phosphatase: 56 U/L (ref 38–126)
Anion gap: 7 (ref 5–15)
BUN: 25 mg/dL — ABNORMAL HIGH (ref 8–23)
CO2: 27 mmol/L (ref 22–32)
Calcium: 9.5 mg/dL (ref 8.9–10.3)
Chloride: 107 mmol/L (ref 98–111)
Creatinine: 1.4 mg/dL — ABNORMAL HIGH (ref 0.61–1.24)
GFR, Estimated: 53 mL/min — ABNORMAL LOW (ref >60)
Glucose, Bld: 132 mg/dL — ABNORMAL HIGH (ref 70–99)
Potassium: 3.7 mmol/L (ref 3.5–5.1)
Sodium: 141 mmol/L (ref 135–145)
Total Bilirubin: 0.4 mg/dL (ref 0.0–1.2)
Total Protein: 6.7 g/dL (ref 6.5–8.1)

## 2024-02-12 LAB — CBC WITH DIFFERENTIAL (CANCER CENTER ONLY)
Abs Immature Granulocytes: 0.02 K/uL (ref 0.00–0.07)
Basophils Absolute: 0 K/uL (ref 0.0–0.1)
Basophils Relative: 0 %
Eosinophils Absolute: 0.1 K/uL (ref 0.0–0.5)
Eosinophils Relative: 1 %
HCT: 31.6 % — ABNORMAL LOW (ref 39.0–52.0)
Hemoglobin: 10.5 g/dL — ABNORMAL LOW (ref 13.0–17.0)
Immature Granulocytes: 0 %
Lymphocytes Relative: 17 %
Lymphs Abs: 1.4 K/uL (ref 0.7–4.0)
MCH: 31.2 pg (ref 26.0–34.0)
MCHC: 33.2 g/dL (ref 30.0–36.0)
MCV: 93.8 fL (ref 80.0–100.0)
Monocytes Absolute: 0.5 K/uL (ref 0.1–1.0)
Monocytes Relative: 6 %
Neutro Abs: 6.3 K/uL (ref 1.7–7.7)
Neutrophils Relative %: 76 %
Platelet Count: 214 K/uL (ref 150–400)
RBC: 3.37 MIL/uL — ABNORMAL LOW (ref 4.22–5.81)
RDW: 15 % (ref 11.5–15.5)
WBC Count: 8.4 K/uL (ref 4.0–10.5)
nRBC: 0 % (ref 0.0–0.2)

## 2024-02-12 MED ORDER — DEXAMETHASONE 6 MG PO TABS
12.0000 mg | ORAL_TABLET | Freq: Once | ORAL | Status: AC
  Administered 2024-02-12: 12 mg via ORAL
  Filled 2024-02-12: qty 2

## 2024-02-12 MED ORDER — ACETAMINOPHEN 325 MG PO TABS
650.0000 mg | ORAL_TABLET | Freq: Once | ORAL | Status: AC
  Administered 2024-02-12: 650 mg via ORAL
  Filled 2024-02-12: qty 2

## 2024-02-12 MED ORDER — DIPHENHYDRAMINE HCL 25 MG PO CAPS
50.0000 mg | ORAL_CAPSULE | Freq: Once | ORAL | Status: AC
  Administered 2024-02-12: 50 mg via ORAL
  Filled 2024-02-12: qty 2

## 2024-02-12 MED ORDER — DARATUMUMAB-HYALURONIDASE-FIHJ 1800-30000 MG-UT/15ML ~~LOC~~ SOLN
1800.0000 mg | Freq: Once | SUBCUTANEOUS | Status: AC
  Administered 2024-02-12: 1800 mg via SUBCUTANEOUS
  Filled 2024-02-12: qty 15

## 2024-02-12 MED ORDER — FAMOTIDINE 20 MG PO TABS
20.0000 mg | ORAL_TABLET | Freq: Once | ORAL | Status: AC
  Administered 2024-02-12: 20 mg via ORAL
  Filled 2024-02-12: qty 1

## 2024-02-12 NOTE — Patient Instructions (Signed)
 [facility] - A DEPT OF [facility]  Discharge Instructions: Thank you for choosing [facility] to provide your oncology and hematology care.   If you have a lab appointment with the Cancer Center, please go directly to the Cancer Center and check in at the registration area.   Wear comfortable clothing and clothing appropriate for easy access to any Portacath or PICC line.   We strive to give you quality time with your provider. You may need to reschedule your appointment if you arrive late (15 or more minutes).  Arriving late affects you and other patients whose appointments are after yours.  Also, if you miss three or more appointments without notifying the office, you may be dismissed from the clinic at the provider's discretion.      For prescription refill requests, have your pharmacy contact our office and allow 72 hours for refills to be completed.    Today you received the following chemotherapy and/or immunotherapy agents Darzalex faspro      To help prevent nausea and vomiting after your treatment, we encourage you to take your nausea medication as directed.  BELOW ARE SYMPTOMS THAT SHOULD BE REPORTED IMMEDIATELY: *FEVER GREATER THAN 100.4 F (38 C) OR HIGHER *CHILLS OR SWEATING *NAUSEA AND VOMITING THAT IS NOT CONTROLLED WITH YOUR NAUSEA MEDICATION *UNUSUAL SHORTNESS OF BREATH *UNUSUAL BRUISING OR BLEEDING *URINARY PROBLEMS (pain or burning when urinating, or frequent urination) *BOWEL PROBLEMS (unusual diarrhea, constipation, pain near the anus) TENDERNESS IN MOUTH AND THROAT WITH OR WITHOUT PRESENCE OF ULCERS (sore throat, sores in mouth, or a toothache) UNUSUAL RASH, SWELLING OR PAIN  UNUSUAL VAGINAL DISCHARGE OR ITCHING   Items with * indicate a potential emergency and should be followed up as soon as possible or go to the Emergency Department if any problems should occur.  Please show the CHEMOTHERAPY ALERT CARD or  IMMUNOTHERAPY ALERT CARD at check-in to the Emergency Department and triage nurse.  Should you have questions after your visit or need to cancel or reschedule your appointment, please contact [facility] - A DEPT OF [facility]  Dept: [phone]  and follow the prompts.  Office hours are 8:00 a.m. to 4:30 p.m. Monday - Friday. Please note that voicemails left after 4:00 p.m. may not be returned until the following business day.  We are closed weekends and major holidays. You have access to a nurse at all times for urgent questions. Please call the main number to the clinic Dept: [phone] and follow the prompts.   For any non-urgent questions, you may also contact your provider using MyChart. We now offer e-Visits for anyone 40 and older to request care online for non-urgent symptoms. For details visit [email or web address].   Also download the MyChart app! Go to the app store, search "MyChart", open the app, select [place], and log in with your MyChart username and password.

## 2024-02-13 ENCOUNTER — Ambulatory Visit: Admitting: Podiatry

## 2024-02-13 DIAGNOSIS — B351 Tinea unguium: Secondary | ICD-10-CM | POA: Diagnosis not present

## 2024-02-13 DIAGNOSIS — M79671 Pain in right foot: Secondary | ICD-10-CM | POA: Diagnosis not present

## 2024-02-13 DIAGNOSIS — M79672 Pain in left foot: Secondary | ICD-10-CM | POA: Diagnosis not present

## 2024-02-13 LAB — KAPPA/LAMBDA LIGHT CHAINS
Kappa free light chain: 18.2 mg/L (ref 3.3–19.4)
Kappa, lambda light chain ratio: 1.44 (ref 0.26–1.65)
Lambda free light chains: 12.6 mg/L (ref 5.7–26.3)

## 2024-02-13 NOTE — Progress Notes (Signed)
 Patient presents for evaluation and treatment of tenderness and some redness around nails feet.  Tenderness around toes with walking and wearing shoes.  Physical exam:  General appearance: Alert, pleasant, and in no acute distress.  Vascular: Pedal pulses: DP 1/4 B/L, PT 0/4 B/L. Mild edema lower legs bilaterally  Neu  Dermatologic:  Nails thickened, disfigured, discolored 1-5 BL with subungual debris.  Redness and hypertrophic nail folds along nail folds bilaterally but no signs of drainage or infection.  Musculoskeletal:     Diagnosis: 1. Painful onychomycotic nails 1 through 5 bilaterally. 2. Pain toes 1 through 5 bilaterally.  Plan: -Debrided onychomycotic nails 1 through 5 bilaterally.  Sharply debrided nails with nail clipper and reduced with a power bur.  Return 3 months [facility]

## 2024-02-15 LAB — MULTIPLE MYELOMA PANEL, SERUM
Albumin SerPl Elph-Mcnc: 3.7 g/dL (ref 2.9–4.4)
Albumin/Glob SerPl: 1.5 (ref 0.7–1.7)
Alpha 1: 0.2 g/dL (ref 0.0–0.4)
Alpha2 Glob SerPl Elph-Mcnc: 0.7 g/dL (ref 0.4–1.0)
B-Globulin SerPl Elph-Mcnc: 0.9 g/dL (ref 0.7–1.3)
Gamma Glob SerPl Elph-Mcnc: 0.8 g/dL (ref 0.4–1.8)
Globulin, Total: 2.5 g/dL (ref 2.2–3.9)
IgA: 98 mg/dL (ref 61–437)
IgG (Immunoglobin G), Serum: 918 mg/dL (ref 603–1613)
IgM (Immunoglobulin M), Srm: 29 mg/dL (ref 15–143)
Total Protein ELP: 6.2 g/dL (ref 6.0–8.5)

## 2024-02-26 ENCOUNTER — Other Ambulatory Visit: Payer: Self-pay

## 2024-03-11 NOTE — Progress Notes (Unsigned)
 HEMATOLOGY/ONCOLOGY CLINIC NOTE  Date of Service: .03/12/2024   Patient Care Team: Pura Lenis, MD as PCP - General (Family Medicine)  CHIEF COMPLAINTS/PURPOSE OF CONSULTATION:  Follow-up for continued evaluation and management of multiple myeloma  INTERVAL HISTORY:  Scott Bass is a 73 y.o. male who is here for continued evaluation and management of his multiple myeloma.***    MEDICAL HISTORY:  Past Medical History:  Diagnosis Date   Arthritis    lower back (12/18/2017)   Better eye: moderate vision impairment; lesser eye: blind    GERD (gastroesophageal reflux disease)    Glaucoma, both eyes    High cholesterol    Hypertension    Hypothyroidism    Irregular heartbeat    Legally blind    both eyes; can see some out of left eye   OSA (obstructive sleep apnea)    Thyroid disease    Type II diabetes mellitus (HCC)    Vitamin B12 deficiency     SURGICAL HISTORY: Past Surgical History:  Procedure Laterality Date   LEFT HEART CATH AND CORONARY ANGIOGRAPHY N/A 12/18/2017   Procedure: LEFT HEART CATH AND CORONARY ANGIOGRAPHY;  Surgeon: Elmira Newman PARAS, MD;  Location: [facility];  Service: Cardiovascular;  Laterality: N/A;   NOSE SURGERY     was crooked; they straightened it out (12/18/2017)    SOCIAL HISTORY: Social History   Socioeconomic History   Marital status: Married    Spouse name: Not on file   Number of children: 0   Years of education: Not on file   Highest education level: Not on file  Occupational History   Occupation: retired  Tobacco Use   Smoking status: Never   Smokeless tobacco: Never  Vaping Use   Vaping status: Never Used  Substance and Sexual Activity   Alcohol use: Not Currently    Comment: occ   Drug use: Never   Sexual activity: Not Currently  Other Topics Concern   Not on file  Social History Narrative   Not on file   Social Drivers of Health   Financial Resource Strain: Low Risk  (10/10/2022)    Received from [facility]   Overall Financial Resource Strain (CARDIA)    Difficulty of Paying Living Expenses: Not very hard  Food Insecurity: Food Insecurity Present (07/03/2023)   Hunger Vital Sign    Worried About Running Out of Food in the Last Year: Sometimes true    Ran Out of Food in the Last Year: Sometimes true  Transportation Needs: No Transportation Needs (01/28/2023)   PRAPARE - Administrator, Civil Service (Medical): No    Lack of Transportation (Non-Medical): No  Physical Activity: Insufficiently Active (12/23/2020)   Received from [facility]   Exercise Vital Sign    On average, how many days per week do you engage in moderate to strenuous exercise (like a brisk walk)?: 2 days    On average, how many minutes do you engage in exercise at this level?: 10 min  Stress: No Stress Concern Present (12/23/2020)   Received from [facility] of [facility]ational [facility] - Occupational Stress Questionnaire    Feeling of Stress : Not at all  Social Connections: Unknown (09/16/2021)   Received from [facility]   Social Network    Social Network: Not on file  Intimate Partner Violence: Not At Risk (01/28/2023)   Humiliation, Afraid, Rape, and Kick questionnaire    Fear of Current or Ex-Partner:  No    Emotionally Abused: No    Physically Abused: No    Sexually Abused: No    FAMILY HISTORY: Family History  Problem Relation Age of Onset   Diabetes Mellitus II Mother 51   Diabetes Mellitus II Sister    Breast cancer Sister    Heart failure Sister 68    ALLERGIES:  is allergic to hctz [hydrochlorothiazide].  MEDICATIONS:  Current Outpatient Medications  Medication Sig Dispense Refill   acetaminophen  (TYLENOL ) 500 MG tablet Take 2 tablets (1,000 mg total) by mouth every 8 (eight) hours as needed for moderate pain.     brimonidine  (ALPHAGAN ) 0.2 % ophthalmic solution Place 1 drop into both eyes 2 (two) times daily.  0   latanoprost  (XALATAN ) 0.005  % ophthalmic solution Place 1 drop into both eyes every evening.     levothyroxine  (SYNTHROID , LEVOTHROID) 150 MCG tablet Take 150 mcg by mouth daily before breakfast.  0   metFORMIN  (GLUCOPHAGE ) 1000 MG tablet Take 1 tablet (1,000 mg total) by mouth daily with breakfast.     midodrine  (PROAMATINE ) 2.5 MG tablet Take 1 tablet (2.5 mg total) by mouth 2 (two) times daily with a meal. (Patient taking differently: Take 2.5 mg by mouth in the morning.) 60 tablet 1   ondansetron  (ZOFRAN ) 4 MG tablet Take 1 tablet (4 mg total) by mouth every 8 (eight) hours as needed for nausea or vomiting. 20 tablet 2   potassium chloride  SA (KLOR-CON  M) 20 MEQ tablet Take 1 tablet (20 mEq total) by mouth 2 (two) times daily. 30 tablet 0   rosuvastatin  (CRESTOR ) 10 MG tablet Take 10 mg by mouth at bedtime.     TUMS 500 MG chewable tablet Chew 1-2 tablets by mouth 2 (two) times daily as needed for indigestion or heartburn.     No current facility-administered medications for this visit.    REVIEW OF SYSTEMS:   .10 Point review of Systems was done is negative except as noted above.  PHYSICAL EXAMINATION: ECOG PERFORMANCE STATUS: 3 - Symptomatic, >50% confined to bed .VSS GENERAL:alert, in no acute distress and comfortable SKIN: no acute rashes, no significant lesions EYES: conjunctiva are pink and non-injected, sclera anicteric OROPHARYNX: MMM, no exudates, no oropharyngeal erythema or ulceration NECK: supple, no JVD LYMPH:  no palpable lymphadenopathy in the cervical, axillary or inguinal regions LUNGS: clear to auscultation b/l with normal respiratory effort HEART: regular rate & rhythm ABDOMEN:  normoactive bowel sounds , non tender, not distended. Extremity: no pedal edema PSYCH: alert & oriented x 3 with fluent speech NEURO: no focal motor/sensory deficits   LABORATORY DATA:  I have reviewed the data as listed.    Latest Ref Rng & Units 02/12/2024    2:15 PM 01/16/2024    1:19 PM 12/18/2023    1:05 PM   CBC  WBC 4.0 - 10.5 K/uL 8.4  6.6  6.7   Hemoglobin 13.0 - 17.0 g/dL 89.4  89.3  89.5   Hematocrit 39.0 - 52.0 % 31.6  31.8  30.9   Platelets 150 - 400 K/uL 214  221  217    .    Latest Ref Rng & Units 02/12/2024    2:15 PM 08/28/2023   12:31 PM 07/31/2023    1:28 PM  CMP  Glucose 70 - 99 mg/dL 867  867  873   BUN 8 - 23 mg/dL 25  17  13    Creatinine 0.61 - 1.24 mg/dL 8.59  8.81  8.91  Sodium 135 - 145 mmol/L 141  142  140   Potassium 3.5 - 5.1 mmol/L 3.7  3.9  3.5   Chloride 98 - 111 mmol/L 107  109  106   CO2 22 - 32 mmol/L 27  26  26    Calcium  8.9 - 10.3 mg/dL 9.5  8.9  9.0   Total Protein 6.5 - 8.1 g/dL 6.7  5.9  6.3   Total Bilirubin 0.0 - 1.2 mg/dL 0.4  0.4  0.4   Alkaline Phos 38 - 126 U/L 56  55  54   AST 15 - 41 U/L 15  12  14    ALT 0 - 44 U/L 12  8  9        Bone Marrow Biopsy 09/29/2022:    RADIOGRAPHIC STUDIES: I have personally reviewed the radiological images as listed and agreed with the findings in the report. No results found.  ASSESSMENT & PLAN:  Scott Bass is a 73 y.o. male who presents to the clinic for continued management of multiple myeloma.   #Multiple myeloma, IgG kappa -Diagnosed May 2024-untreated since patient did not follow-up now admitted with progressive myeloma with severe anemia and progressive renal failure. -CTs 12/04/2022-innumerable lytic lesions, large expansile lesion in the anterior right second rib, multiple pathologic rib fractures, expansile T4 (T3 on CT cervical spine) mass with soft tissue component extending into the central canal and left neural foramen, C7 transverse process fracture -Decadron  daily x 4 starting 12/05/2022 -Inpatient treatment included: Cycle 1 day 1 Velcade  12/07/2022 Decadron  daily x 4 starting 12/12/2022 Cycle 1 day 8 Velcade  12/14/2022 Cycle 1 day 15 Velcade  12/21/2022 -Added daratumumab  on Cycle 1 Day 22 on 01/04/2023 -Held Velcade  starting 03/13/2023.   PLAN: -Due for Cycle 17, Day 1 today with  Dara/Dex.  -Labs from today were reviewed and adequate for treatment. *** -Proceed with treatment today without any dose modifications -RTC 4 weeks labs, follow up before Cycle 18, Day 1.    FOLLOW-UP: Continue with weekly treatments with dara/velcade /dex per treatment plan   FOLLOW-UP: Per integrated scheduling    All of the patient's questions were answered with apparent satisfaction. The patient knows to call the clinic with any problems, questions or concerns.

## 2024-03-12 ENCOUNTER — Encounter: Payer: Self-pay | Admitting: Hematology

## 2024-03-12 ENCOUNTER — Inpatient Hospital Stay: Attending: Hematology

## 2024-03-12 ENCOUNTER — Inpatient Hospital Stay

## 2024-03-12 ENCOUNTER — Other Ambulatory Visit: Payer: Self-pay

## 2024-03-12 ENCOUNTER — Inpatient Hospital Stay (HOSPITAL_BASED_OUTPATIENT_CLINIC_OR_DEPARTMENT_OTHER): Admitting: Physician Assistant

## 2024-03-12 VITALS — BP 159/85 | HR 88 | Temp 99.0°F | Resp 18 | Wt 143.4 lb

## 2024-03-12 DIAGNOSIS — Z79899 Other long term (current) drug therapy: Secondary | ICD-10-CM | POA: Diagnosis not present

## 2024-03-12 DIAGNOSIS — Z833 Family history of diabetes mellitus: Secondary | ICD-10-CM | POA: Diagnosis not present

## 2024-03-12 DIAGNOSIS — C9000 Multiple myeloma not having achieved remission: Secondary | ICD-10-CM | POA: Diagnosis present

## 2024-03-12 DIAGNOSIS — Z5112 Encounter for antineoplastic immunotherapy: Secondary | ICD-10-CM | POA: Diagnosis present

## 2024-03-12 DIAGNOSIS — E119 Type 2 diabetes mellitus without complications: Secondary | ICD-10-CM | POA: Insufficient documentation

## 2024-03-12 DIAGNOSIS — Z7189 Other specified counseling: Secondary | ICD-10-CM

## 2024-03-12 DIAGNOSIS — Z803 Family history of malignant neoplasm of breast: Secondary | ICD-10-CM | POA: Diagnosis not present

## 2024-03-12 DIAGNOSIS — Z8249 Family history of ischemic heart disease and other diseases of the circulatory system: Secondary | ICD-10-CM | POA: Insufficient documentation

## 2024-03-12 DIAGNOSIS — Z79620 Long term (current) use of immunosuppressive biologic: Secondary | ICD-10-CM | POA: Insufficient documentation

## 2024-03-12 DIAGNOSIS — Z5941 Food insecurity: Secondary | ICD-10-CM | POA: Insufficient documentation

## 2024-03-12 LAB — CBC WITH DIFFERENTIAL (CANCER CENTER ONLY)
Abs Immature Granulocytes: 0.01 K/uL (ref 0.00–0.07)
Basophils Absolute: 0 K/uL (ref 0.0–0.1)
Basophils Relative: 0 %
Eosinophils Absolute: 0.1 K/uL (ref 0.0–0.5)
Eosinophils Relative: 1 %
HCT: 31.5 % — ABNORMAL LOW (ref 39.0–52.0)
Hemoglobin: 10.5 g/dL — ABNORMAL LOW (ref 13.0–17.0)
Immature Granulocytes: 0 %
Lymphocytes Relative: 17 %
Lymphs Abs: 1.3 K/uL (ref 0.7–4.0)
MCH: 31.3 pg (ref 26.0–34.0)
MCHC: 33.3 g/dL (ref 30.0–36.0)
MCV: 93.8 fL (ref 80.0–100.0)
Monocytes Absolute: 0.6 K/uL (ref 0.1–1.0)
Monocytes Relative: 7 %
Neutro Abs: 5.7 K/uL (ref 1.7–7.7)
Neutrophils Relative %: 75 %
Platelet Count: 219 K/uL (ref 150–400)
RBC: 3.36 MIL/uL — ABNORMAL LOW (ref 4.22–5.81)
RDW: 14 % (ref 11.5–15.5)
WBC Count: 7.6 K/uL (ref 4.0–10.5)
nRBC: 0 % (ref 0.0–0.2)

## 2024-03-12 LAB — CMP (CANCER CENTER ONLY)
ALT: 12 U/L (ref 0–44)
AST: 14 U/L — ABNORMAL LOW (ref 15–41)
Albumin: 3.7 g/dL (ref 3.5–5.0)
Alkaline Phosphatase: 53 U/L (ref 38–126)
Anion gap: 6 (ref 5–15)
BUN: 24 mg/dL — ABNORMAL HIGH (ref 8–23)
CO2: 27 mmol/L (ref 22–32)
Calcium: 9.1 mg/dL (ref 8.9–10.3)
Chloride: 108 mmol/L (ref 98–111)
Creatinine: 1.71 mg/dL — ABNORMAL HIGH (ref 0.61–1.24)
GFR, Estimated: 42 mL/min — ABNORMAL LOW (ref >60)
Glucose, Bld: 142 mg/dL — ABNORMAL HIGH (ref 70–99)
Potassium: 4.1 mmol/L (ref 3.5–5.1)
Sodium: 141 mmol/L (ref 135–145)
Total Bilirubin: 0.4 mg/dL (ref 0.0–1.2)
Total Protein: 6.2 g/dL — ABNORMAL LOW (ref 6.5–8.1)

## 2024-03-12 MED ORDER — DARATUMUMAB-HYALURONIDASE-FIHJ 1800-30000 MG-UT/15ML ~~LOC~~ SOLN
1800.0000 mg | Freq: Once | SUBCUTANEOUS | Status: AC
  Administered 2024-03-12: 1800 mg via SUBCUTANEOUS
  Filled 2024-03-12: qty 15

## 2024-03-12 MED ORDER — ACETAMINOPHEN 325 MG PO TABS
650.0000 mg | ORAL_TABLET | Freq: Once | ORAL | Status: AC
  Administered 2024-03-12: 650 mg via ORAL
  Filled 2024-03-12: qty 2

## 2024-03-12 MED ORDER — DIPHENHYDRAMINE HCL 25 MG PO CAPS
50.0000 mg | ORAL_CAPSULE | Freq: Once | ORAL | Status: AC
  Administered 2024-03-12: 50 mg via ORAL
  Filled 2024-03-12: qty 2

## 2024-03-12 MED ORDER — DEXAMETHASONE 6 MG PO TABS
12.0000 mg | ORAL_TABLET | Freq: Once | ORAL | Status: AC
  Administered 2024-03-12: 12 mg via ORAL
  Filled 2024-03-12: qty 2

## 2024-03-12 MED ORDER — FAMOTIDINE 20 MG PO TABS
20.0000 mg | ORAL_TABLET | Freq: Once | ORAL | Status: AC
  Administered 2024-03-12: 20 mg via ORAL
  Filled 2024-03-12: qty 1

## 2024-03-12 NOTE — Progress Notes (Signed)
 Pt came in treatment room with obvious bedbug infestation.  Discussed extermination with him & he says that he paid $1500 for extermination & is trying some sound waves to scare them away.  Informed that sound probably would not help.  D/C to home with driver.  Multiple bedbugs of variable sizes seen on pt, clothing, & linens.  Terminex in after d/c to exterminate.  Message to CSW to see if there are any other options for pt.  Linens double bagged & placed on linen cart.  Encouraged pt to let [facility] system help them.

## 2024-03-13 LAB — KAPPA/LAMBDA LIGHT CHAINS
Kappa free light chain: 16.6 mg/L (ref 3.3–19.4)
Kappa, lambda light chain ratio: 1.42 (ref 0.26–1.65)
Lambda free light chains: 11.7 mg/L (ref 5.7–26.3)

## 2024-03-14 ENCOUNTER — Telehealth: Payer: Self-pay

## 2024-03-14 NOTE — Telephone Encounter (Signed)
 [facility] CSW Progress Note  Clinical Child Psychotherapist notified by infusion nursing staff that bugs were observed on patient during treatment. Patient informed nursing staff of personal attempts to get rid of the bugs in his home with no success. Clinical Social Worker asked to assess for any pest control assistance. Clinical Social Worker spoke with patient's spouse on this date. Patient / Family meet income requirements for pest control assistance. CSW explained proof of income documents are needed to submit for final approval. CSW attempted to answer any questions by patient's spouse to ease concern or worries. Patient's spouse would like to consult with patient prior to proceeding. CSW understood and provided direct contact. Charge Nurse notified.     Lizbeth Sprague, LCSW Clinical Social Worker [facility]

## 2024-03-17 LAB — MULTIPLE MYELOMA PANEL, SERUM
Albumin SerPl Elph-Mcnc: 3.5 g/dL (ref 2.9–4.4)
Albumin/Glob SerPl: 1.6 (ref 0.7–1.7)
Alpha 1: 0.1 g/dL (ref 0.0–0.4)
Alpha2 Glob SerPl Elph-Mcnc: 0.6 g/dL (ref 0.4–1.0)
B-Globulin SerPl Elph-Mcnc: 0.8 g/dL (ref 0.7–1.3)
Gamma Glob SerPl Elph-Mcnc: 0.7 g/dL (ref 0.4–1.8)
Globulin, Total: 2.2 g/dL (ref 2.2–3.9)
IgA: 85 mg/dL (ref 61–437)
IgG (Immunoglobin G), Serum: 860 mg/dL (ref 603–1613)
IgM (Immunoglobulin M), Srm: 25 mg/dL (ref 15–143)
Total Protein ELP: 5.7 g/dL — ABNORMAL LOW (ref 6.0–8.5)

## 2024-03-20 ENCOUNTER — Other Ambulatory Visit: Payer: Self-pay

## 2024-03-31 ENCOUNTER — Other Ambulatory Visit: Payer: Self-pay

## 2024-04-09 ENCOUNTER — Encounter: Payer: Self-pay | Admitting: Hematology

## 2024-04-09 ENCOUNTER — Inpatient Hospital Stay: Attending: Hematology

## 2024-04-09 ENCOUNTER — Inpatient Hospital Stay

## 2024-04-09 ENCOUNTER — Inpatient Hospital Stay: Admitting: Hematology

## 2024-04-09 VITALS — BP 158/85 | HR 100 | Temp 98.5°F | Resp 18 | Wt 135.1 lb

## 2024-04-09 DIAGNOSIS — Z7189 Other specified counseling: Secondary | ICD-10-CM

## 2024-04-09 DIAGNOSIS — Z79899 Other long term (current) drug therapy: Secondary | ICD-10-CM | POA: Diagnosis not present

## 2024-04-09 DIAGNOSIS — Z5112 Encounter for antineoplastic immunotherapy: Secondary | ICD-10-CM | POA: Diagnosis present

## 2024-04-09 DIAGNOSIS — C9000 Multiple myeloma not having achieved remission: Secondary | ICD-10-CM

## 2024-04-09 LAB — CBC WITH DIFFERENTIAL (CANCER CENTER ONLY)
Abs Immature Granulocytes: 0.01 K/uL (ref 0.00–0.07)
Basophils Absolute: 0 K/uL (ref 0.0–0.1)
Basophils Relative: 1 %
Eosinophils Absolute: 0 K/uL (ref 0.0–0.5)
Eosinophils Relative: 1 %
HCT: 34.7 % — ABNORMAL LOW (ref 39.0–52.0)
Hemoglobin: 11.8 g/dL — ABNORMAL LOW (ref 13.0–17.0)
Immature Granulocytes: 0 %
Lymphocytes Relative: 20 %
Lymphs Abs: 1.3 K/uL (ref 0.7–4.0)
MCH: 31.5 pg (ref 26.0–34.0)
MCHC: 34 g/dL (ref 30.0–36.0)
MCV: 92.5 fL (ref 80.0–100.0)
Monocytes Absolute: 0.4 K/uL (ref 0.1–1.0)
Monocytes Relative: 6 %
Neutro Abs: 4.9 K/uL (ref 1.7–7.7)
Neutrophils Relative %: 72 %
Platelet Count: 213 K/uL (ref 150–400)
RBC: 3.75 MIL/uL — ABNORMAL LOW (ref 4.22–5.81)
RDW: 13.5 % (ref 11.5–15.5)
WBC Count: 6.7 K/uL (ref 4.0–10.5)
nRBC: 0 % (ref 0.0–0.2)

## 2024-04-09 LAB — CMP (CANCER CENTER ONLY)
ALT: 11 U/L (ref 0–44)
AST: 19 U/L (ref 15–41)
Albumin: 4.1 g/dL (ref 3.5–5.0)
Alkaline Phosphatase: 57 U/L (ref 38–126)
Anion gap: 10 (ref 5–15)
BUN: 17 mg/dL (ref 8–23)
CO2: 26 mmol/L (ref 22–32)
Calcium: 9.3 mg/dL (ref 8.9–10.3)
Chloride: 105 mmol/L (ref 98–111)
Creatinine: 1.39 mg/dL — ABNORMAL HIGH (ref 0.61–1.24)
GFR, Estimated: 54 mL/min — ABNORMAL LOW (ref >60)
Glucose, Bld: 201 mg/dL — ABNORMAL HIGH (ref 70–99)
Potassium: 4.1 mmol/L (ref 3.5–5.1)
Sodium: 140 mmol/L (ref 135–145)
Total Bilirubin: 0.4 mg/dL (ref 0.0–1.2)
Total Protein: 6.5 g/dL (ref 6.5–8.1)

## 2024-04-09 MED ORDER — DEXAMETHASONE 6 MG PO TABS
12.0000 mg | ORAL_TABLET | Freq: Once | ORAL | Status: AC
  Administered 2024-04-09: 12 mg via ORAL
  Filled 2024-04-09: qty 2

## 2024-04-09 MED ORDER — DIPHENHYDRAMINE HCL 25 MG PO CAPS
50.0000 mg | ORAL_CAPSULE | Freq: Once | ORAL | Status: AC
  Administered 2024-04-09: 50 mg via ORAL
  Filled 2024-04-09: qty 2

## 2024-04-09 MED ORDER — FAMOTIDINE 20 MG PO TABS
20.0000 mg | ORAL_TABLET | Freq: Once | ORAL | Status: AC
  Administered 2024-04-09: 20 mg via ORAL
  Filled 2024-04-09: qty 1

## 2024-04-09 MED ORDER — DARATUMUMAB-HYALURONIDASE-FIHJ 1800-30000 MG-UT/15ML ~~LOC~~ SOLN
1800.0000 mg | Freq: Once | SUBCUTANEOUS | Status: AC
  Administered 2024-04-09: 1800 mg via SUBCUTANEOUS
  Filled 2024-04-09: qty 15

## 2024-04-09 MED ORDER — ACETAMINOPHEN 325 MG PO TABS
650.0000 mg | ORAL_TABLET | Freq: Once | ORAL | Status: AC
  Administered 2024-04-09: 650 mg via ORAL
  Filled 2024-04-09: qty 2

## 2024-04-09 NOTE — Patient Instructions (Signed)

## 2024-04-10 ENCOUNTER — Encounter: Payer: Self-pay | Admitting: General Practice

## 2024-04-10 LAB — KAPPA/LAMBDA LIGHT CHAINS
Kappa free light chain: 15.5 mg/L (ref 3.3–19.4)
Kappa, lambda light chain ratio: 1.72 — ABNORMAL HIGH (ref 0.26–1.65)
Lambda free light chains: 9 mg/L (ref 5.7–26.3)

## 2024-04-10 NOTE — Progress Notes (Signed)
 [facility] Spiritual Care Note  Missed Mr Jayne in infusion yesterday, so phoned today. He reports that he is struggling with terminal bone cancer and his wife's death last week. Provided emotional support and introduction to Spiritual Care. We set up a phone appointment for tomorrow, 04/11/2024, at 1:00pm to follow up in more detail.    [address] [place], [place], [facility] Pager [phone] Voicemail [phone]

## 2024-04-11 ENCOUNTER — Encounter: Payer: Self-pay | Admitting: General Practice

## 2024-04-11 LAB — MULTIPLE MYELOMA PANEL, SERUM
Albumin SerPl Elph-Mcnc: 3.6 g/dL (ref 2.9–4.4)
Albumin/Glob SerPl: 1.4 (ref 0.7–1.7)
Alpha 1: 0.2 g/dL (ref 0.0–0.4)
Alpha2 Glob SerPl Elph-Mcnc: 0.7 g/dL (ref 0.4–1.0)
B-Globulin SerPl Elph-Mcnc: 1 g/dL (ref 0.7–1.3)
Gamma Glob SerPl Elph-Mcnc: 0.8 g/dL (ref 0.4–1.8)
Globulin, Total: 2.7 g/dL (ref 2.2–3.9)
IgA: 97 mg/dL (ref 61–437)
IgG (Immunoglobin G), Serum: 904 mg/dL (ref 603–1613)
IgM (Immunoglobulin M), Srm: 28 mg/dL (ref 15–143)
Total Protein ELP: 6.3 g/dL (ref 6.0–8.5)

## 2024-04-11 NOTE — Progress Notes (Signed)
 [facility] Spiritual Care Note  Attempted to reach Mr Etienne by phone for scheduled phone appointment. Left voicemail at home and mobile numbers, encouraging return call to reschedule.  [address] [place], [place], [facility] Pager [phone] Voicemail [phone]

## 2024-04-25 ENCOUNTER — Other Ambulatory Visit: Payer: Self-pay | Admitting: Hematology

## 2024-04-25 DIAGNOSIS — Z7189 Other specified counseling: Secondary | ICD-10-CM

## 2024-04-25 DIAGNOSIS — C9000 Multiple myeloma not having achieved remission: Secondary | ICD-10-CM

## 2024-05-06 NOTE — Progress Notes (Deleted)
 "   HEMATOLOGY/ONCOLOGY CLINIC NOTE  Date of Service: .05/07/2024   Patient Care Team: Pura Lenis, MD as PCP - General (Family Medicine)  CHIEF COMPLAINTS/PURPOSE OF CONSULTATION:  Follow-up for continued evaluation and management of multiple myeloma  INTERVAL HISTORY:  Scott Bass is a 73 y.o. male who is here for continued evaluation and management of his multiple myeloma. He was seen in the infusion room.   Mr. Aderman reports***   MEDICAL HISTORY:  Past Medical History:  Diagnosis Date   Arthritis    lower back (12/18/2017)   Better eye: moderate vision impairment; lesser eye: blind    GERD (gastroesophageal reflux disease)    Glaucoma, both eyes    High cholesterol    Hypertension    Hypothyroidism    Irregular heartbeat    Legally blind    both eyes; can see some out of left eye   OSA (obstructive sleep apnea)    Thyroid disease    Type II diabetes mellitus (HCC)    Vitamin B12 deficiency     SURGICAL HISTORY: Past Surgical History:  Procedure Laterality Date   LEFT HEART CATH AND CORONARY ANGIOGRAPHY N/A 12/18/2017   Procedure: LEFT HEART CATH AND CORONARY ANGIOGRAPHY;  Surgeon: Elmira Newman PARAS, MD;  Location: [facility];  Service: Cardiovascular;  Laterality: N/A;   NOSE SURGERY     was crooked; they straightened it out (12/18/2017)    SOCIAL HISTORY: Social History   Socioeconomic History   Marital status: Married    Spouse name: Not on file   Number of children: 0   Years of education: Not on file   Highest education level: Not on file  Occupational History   Occupation: retired  Tobacco Use   Smoking status: Never   Smokeless tobacco: Never  Vaping Use   Vaping status: Never Used  Substance and Sexual Activity   Alcohol use: Not Currently    Comment: occ   Drug use: Never   Sexual activity: Not Currently  Other Topics Concern   Not on file  Social History Narrative   Not on file   Social Drivers of Health    Tobacco Use: Low Risk (04/18/2024)   Received from [facility]   Patient History    Smoking Tobacco Use: Never    Smokeless Tobacco Use: Never    Passive Exposure: Never  Financial Resource Strain: Low Risk (04/15/2024)   Received from [facility]   Overall Financial Resource Strain (CARDIA)    How hard is it for you to pay for the very basics like food, housing, medical care, and heating?: Not hard at all  Food Insecurity: No Food Insecurity (04/15/2024)   Received from [facility]   Epic    Within the past 12 months, you worried that your food would run out before you got the money to buy more.: Never true    Within the past 12 months, the food you bought just didn't last and you didn't have money to get more.: Never true  Transportation Needs: No Transportation Needs (04/15/2024)   Received from [facility]    In the past 12 months, has lack of transportation kept you from medical appointments or from getting medications?: No    In the past 12 months, has lack of transportation kept you from meetings, work, or from getting things needed for daily living?: No  Physical Activity: Not on file  Stress: Not on file  Social Connections: Not  on file  Intimate Partner Violence: Not At Risk (01/28/2023)   Humiliation, Afraid, Rape, and Kick questionnaire    Fear of Current or Ex-Partner: No    Emotionally Abused: No    Physically Abused: No    Sexually Abused: No  Depression (PHQ2-9): Low Risk (02/12/2024)   Depression (PHQ2-9)    PHQ-2 Score: 0  Alcohol Screen: Not on file  Housing: Low Risk (04/15/2024)   Received from [facility]    In the last 12 months, was there a time when you were not able to pay the mortgage or rent on time?: No    In the past 12 months, how many times have you moved where you were living?: 1    At any time in the past 12 months, were you homeless or living in a shelter (including now)?: No  Utilities: Not At Risk (04/15/2024)    Received from [facility]    In the past 12 months has the electric, gas, oil, or water  company threatened to shut off services in your home?: No  Health Literacy: Not on file    FAMILY HISTORY: Family History  Problem Relation Age of Onset   Diabetes Mellitus II Mother 43   Diabetes Mellitus II Sister    Breast cancer Sister    Heart failure Sister 83    ALLERGIES:  is allergic to hctz [hydrochlorothiazide].  MEDICATIONS:  Current Outpatient Medications  Medication Sig Dispense Refill   acetaminophen  (TYLENOL ) 500 MG tablet Take 2 tablets (1,000 mg total) by mouth every 8 (eight) hours as needed for moderate pain.     brimonidine  (ALPHAGAN ) 0.2 % ophthalmic solution Place 1 drop into both eyes 2 (two) times daily.  0   latanoprost  (XALATAN ) 0.005 % ophthalmic solution Place 1 drop into both eyes every evening.     levothyroxine  (SYNTHROID , LEVOTHROID) 150 MCG tablet Take 150 mcg by mouth daily before breakfast.  0   metFORMIN  (GLUCOPHAGE ) 1000 MG tablet Take 1 tablet (1,000 mg total) by mouth daily with breakfast.     midodrine  (PROAMATINE ) 2.5 MG tablet Take 1 tablet (2.5 mg total) by mouth 2 (two) times daily with a meal. (Patient taking differently: Take 2.5 mg by mouth in the morning.) 60 tablet 1   ondansetron  (ZOFRAN ) 4 MG tablet Take 1 tablet (4 mg total) by mouth every 8 (eight) hours as needed for nausea or vomiting. 20 tablet 2   potassium chloride  SA (KLOR-CON  M) 20 MEQ tablet Take 1 tablet (20 mEq total) by mouth 2 (two) times daily. 30 tablet 0   rosuvastatin  (CRESTOR ) 10 MG tablet Take 10 mg by mouth at bedtime.     TUMS 500 MG chewable tablet Chew 1-2 tablets by mouth 2 (two) times daily as needed for indigestion or heartburn.     No current facility-administered medications for this visit.    REVIEW OF SYSTEMS:   .10 Point review of Systems was done is negative except as noted above.  PHYSICAL EXAMINATION: There were no vitals filed for this  visit.  ECOG PERFORMANCE STATUS: 2 - Symptomatic, <50% confined to bed .VSS GENERAL:alert, in no acute distress and comfortable SKIN: no acute rashes, no significant lesions EYES: conjunctiva are pink and non-injected, sclera anicteric LUNGS: clear to auscultation b/l with normal respiratory effort HEART: regular rate & rhythm Extremity: no pedal edema PSYCH: alert & oriented x 3 with fluent speech NEURO: no focal motor/sensory deficits   LABORATORY DATA:  I  have reviewed the data as listed.    Latest Ref Rng & Units 04/09/2024    1:56 PM 03/12/2024    2:56 PM 02/12/2024    2:15 PM  CBC  WBC 4.0 - 10.5 K/uL 6.7  7.6  8.4   Hemoglobin 13.0 - 17.0 g/dL 88.1  89.4  89.4   Hematocrit 39.0 - 52.0 % 34.7  31.5  31.6   Platelets 150 - 400 K/uL 213  219  214    .    Latest Ref Rng & Units 04/09/2024    1:56 PM 03/12/2024    2:56 PM 02/12/2024    2:15 PM  CMP  Glucose 70 - 99 mg/dL 798  857  867   BUN 8 - 23 mg/dL 17  24  25    Creatinine 0.61 - 1.24 mg/dL 8.60  8.28  8.59   Sodium 135 - 145 mmol/L 140  141  141   Potassium 3.5 - 5.1 mmol/L 4.1  4.1  3.7   Chloride 98 - 111 mmol/L 105  108  107   CO2 22 - 32 mmol/L 26  27  27    Calcium  8.9 - 10.3 mg/dL 9.3  9.1  9.5   Total Protein 6.5 - 8.1 g/dL 6.5  6.2  6.7   Total Bilirubin 0.0 - 1.2 mg/dL 0.4  0.4  0.4   Alkaline Phos 38 - 126 U/L 57  53  56   AST 15 - 41 U/L 19  14  15    ALT 0 - 44 U/L 11  12  12        Bone Marrow Biopsy 09/29/2022:    RADIOGRAPHIC STUDIES: I have personally reviewed the radiological images as listed and agreed with the findings in the report. No results found.  ASSESSMENT & PLAN:  Scott Bass is a 73 y.o. male who presents to the clinic for continued management of multiple myeloma.   #Multiple myeloma, IgG kappa -Diagnosed May 2024-untreated since patient did not follow-up now admitted with progressive myeloma with severe anemia and progressive renal failure. -CTs 12/04/2022-innumerable lytic  lesions, large expansile lesion in the anterior right second rib, multiple pathologic rib fractures, expansile T4 (T3 on CT cervical spine) mass with soft tissue component extending into the central canal and left neural foramen, C7 transverse process fracture -Decadron  daily x 4 starting 12/05/2022 -Inpatient treatment included: Cycle 1 day 1 Velcade  12/07/2022 Decadron  daily x 4 starting 12/12/2022 Cycle 1 day 8 Velcade  12/14/2022 Cycle 1 day 15 Velcade  12/21/2022 -Added daratumumab  on Cycle 1 Day 22 on 01/04/2023 -Held Velcade  starting 03/13/2023.   PLAN: -Due for Cycle 19, Day 1 today with Dara/Dex.  -Labs from today were reviewed and adequate for treatment. *** -Most recent myeloma labs from 04/09/2024 did not detect M protein and serum free light chains were normal.  -Encouraged to drink 1-2 L of water  per day. -Proceed with treatment today without any dose modifications -RTC 4 weeks labs, follow up before Cycle 20, Day 1.    FOLLOW-UP: Continue with weekly treatments with dara/dex per treatment plan   All of the patient's questions were answered with apparent satisfaction. The patient knows to call the clinic with any problems, questions or concerns.   I have spent a total of 30 minutes minutes of face-to-face and non-face-to-face time, preparing to see the patient,  performing a medically appropriate examination, counseling and educating the patient, documenting clinical information in the electronic health record, independently interpreting results and communicating results to  the patient, and care coordination.   Johnston Police PA-C Dept of Hematology and Oncology [facility] at [facility] Phone: [phone]   "

## 2024-05-07 ENCOUNTER — Inpatient Hospital Stay: Admitting: Physician Assistant

## 2024-05-07 ENCOUNTER — Inpatient Hospital Stay

## 2024-05-09 ENCOUNTER — Other Ambulatory Visit: Payer: Self-pay

## 2024-05-13 ENCOUNTER — Other Ambulatory Visit: Payer: Self-pay

## 2024-05-15 ENCOUNTER — Encounter: Payer: Self-pay | Admitting: Hematology

## 2024-05-15 ENCOUNTER — Other Ambulatory Visit: Payer: Self-pay

## 2024-05-15 DIAGNOSIS — C9000 Multiple myeloma not having achieved remission: Secondary | ICD-10-CM

## 2024-05-15 NOTE — Progress Notes (Signed)
 05/15/2024  [facility]   Patient ID:  Scott Bass is a 74 y.o. (DOB 1951-05-02) male.  Assessment and Plan  Scott Bass was seen today for memory loss.  Diagnoses and all orders for this visit:  Legal blindness Comments: Referral back to ophthalmology for evaluation  Glaucoma of both eyes, unspecified glaucoma type -     Ambulatory referral to Ophthalmology; Future  Immunization due -     Fluad Trivalent Adjuvanted  Acquired hypothyroidism Comments: Last TSH abnormal.  Was not taking medicine daily.  Diabetes mellitus type 2, controlled (*) Comments: Metformin  500 twice daily  Multiple myeloma not having achieved remission (*) Comments: Followed by oncology and receiving chemotherapy.  Reactive depression Comments: Scott Bass's wife passed away in 2025-05-18.  Declines treatment for depression at this time.  Food insecurity Comments: 40 pound weight loss over the last year due to chronic illness and now reactive depression.  Food box given to patient today.  Social worker  CKD (chronic kidney disease) stage 4, GFR 15-29 ml/min (*)  Chronic heart failure with preserved ejection fraction (HFpEF) (*)  Hypertension associated with diabetes (*)  Combined hyperlipidemia associated with type 2 diabetes mellitus (*) -     rosuvastatin  calcium  (CRESTOR ) 10 mg tablet; TAKE 1 TABLET BY MOUTH EVERY EVENING  Patient reports that he is only taking metformin , Synthroid , Crestor , and B12.  Encouraged him to bring his medicine bottles into each doctors visit every time. Assessment & Plan 1. Depression: - His depression is likely multifactorial, stemming from his multiple myeloma diagnosis and the recent bereavement of his wife. - The initiation of antidepressant therapy was discussed, but he expressed a preference to defer this treatment option at present. - A referral to a child psychotherapist will be made to provide additional support and resources.  2. Cataracts: - He has  been diagnosed with cataracts and reports blurry vision. - A referral to Dr. Prentice Salmon will be made for further evaluation and potential surgical intervention.  3. Multiple myeloma: - He is currently undergoing chemotherapy for multiple myeloma and reports no adverse effects from the treatment. - He will continue with his current chemotherapy regimen.  4. Medication management: - He is currently taking metformin  500 mg twice a day, Synthroid , cholesterol medication, and B12 supplements. - He has not been taking his cholesterol medication regularly. - He will continue his current medications and ensure daily adherence to his cholesterol medication.  5. Health maintenance: - He will receive an influenza vaccine today.  Follow-up: The patient will follow up in 2 months.  Follow up in about 2 months (around 07/13/2024) for a Diabetes recheck.   Health Maintenance Due  Topic Date Due   Zoster Vaccine (1 of 2) 06/20/2011   DTaP/Tdap/Td Vaccines (2 - Td or Tdap) 09/24/2017   Medicare Annual Wellness  12/23/2021   Colorectal Cancer Screening  05/31/2022   COVID-19 Vaccine (5 - 2025-26 season) 01/07/2024     Risks, benefits, and alternatives of the medications and treatment plan prescribed today were discussed, and patient expressed understanding. Plan follow-up as discussed or as needed if any worsening symptoms or change in condition.    A yearly preventative health exam was recommended and current age based recommendations were discussed.   Subjective   Patient ID:  Scott Bass is a 74 y.o. (DOB [birth year]) male    Patient presents with   Memory Loss    Ongoing for a couple of weeks. Forgets he has appointments and forgets things. He  lost his wife a couple of weeks.      History of Present Illness The patient presents for evaluation of depression, cataracts, multiple myeloma, and medication management.  He is currently grappling with the recent loss of his wife, who  passed away in the hospital a few weeks ago due to stage 4 kidney disease. He resides alone and has no immediate family in [place].   Social work referral was placed but they were unable to reach him.  Social worker's number given to him today and he will call them directly.    He is legally blind but can still read, although his vision is blurry. He is under the care of Dr. Prentice Salmon at [facility] for cataracts and glaucoma. He does not have an upcoming appointment scheduled and plans to call to arrange one. He is unsure about the potential benefits of cataract surgery given his terminal condition but expresses a desire to improve his vision.  He is currently undergoing treatment for multiple myeloma, which includes chemotherapy. He reports no adverse effects from the treatment, attributing this to pre-medication administered 30 minutes prior to the chemotherapy. His weight has been decreasing and is now at 141 pounds.  He admits to food insecurity and a food box was given to him today    He is on a regimen of metformin  500 mg twice daily, Synthroid , and cholesterol medication. He reports not taking the cholesterol medication as prescribed. He also takes B12 supplements. He has run out of magnesium  supplements and has not refilled the prescription. He is unsure if he is taking folic acid . He is not taking metoprolol , pantoprazole , or spironolactone . He reports a lack of food at home and financial constraints.  Sleep: He reports sleeping late at night and into the morning, averaging at least five hours of sleep per night. Living Condition: Lives alone   Current Outpatient Medications  Medication Instructions   brimonidine  (ALPHAGAN ) 0.2 % ophthalmic solution 2 times a day   latanoprost  (XALATAN ) 0.005% ophthalmic solution 1 drop, At bedtime   levothyroxine  sodium (SYNTHROID ,LEVOTHROID,LEVOXYL ) 150 mcg, Oral, Daily   metFORMIN  (GLUCOPHAGE ) 500 mg, Oral, Two times a day with  meals   rosuvastatin  calcium  (CRESTOR ) 10 mg tablet TAKE 1 TABLET BY MOUTH EVERY EVENING   vitamin B-12 (CYANOCOBALAMIN ) 1,000 mcg, Oral, Daily   Patient Care Team: Alm FORBES Bilis, MD as PCP - General (Family Medicine) Erick JONELLE Bergamo, MD (Cardiology) Emaline Candida Saran, MD (Medical Oncology) Prentice Millard Mandes, MD (Ophthalmology) Social History   Tobacco Use   Smoking status: Never    Passive exposure: Never   Smokeless tobacco: Never  Substance Use Topics   Alcohol use: No    Reviewed and updated this visit by provider: Tobacco  Allergies  Meds  Problems  Med Hx  Surg Hx  Fam Hx       Review of Systems is complete and negative except as noted.  Objective   Vitals:   05/15/24 0857  BP: 141/72  Pulse: 58  Temp: 97.9 F (36.6 C)  TempSrc: Temporal  Resp: 16  Height: 5' 7 (1.702 m)  Weight: 141 lb (64 kg)  SpO2: 98%  BMI (Calculated): 22.1   Wt Readings from Last 3 Encounters:  05/15/24 141 lb (64 kg)  04/15/24 141 lb (64 kg)  10/10/22 183 lb (83 kg)      Constitutional: Mildly ill appearing cachectic black male who appears depressed. Lymphatics: There is no anterior or posterior cervical adenopathy. Neck:  Supple with normal range of motion. No thyroid mass and no thyromegaly present. Cardiovascular: Regular rate and rhythm with normal heart sounds and no edema present. There are no carotid bruits. Respiratory: Normal effort and clear to auscultation without wheezing or prolonged expiration. Psychiatric: Behavior is Cooperative and Polite.   Computer technology was used to create visit note. Consent from the patient/caregiver was obtained prior to its use.  Alm FORBES Bilis, MD

## 2024-05-16 ENCOUNTER — Inpatient Hospital Stay: Attending: Hematology

## 2024-05-16 ENCOUNTER — Inpatient Hospital Stay

## 2024-05-16 VITALS — BP 150/89 | HR 86 | Temp 98.7°F | Resp 18

## 2024-05-16 DIAGNOSIS — Z79899 Other long term (current) drug therapy: Secondary | ICD-10-CM | POA: Diagnosis not present

## 2024-05-16 DIAGNOSIS — C9000 Multiple myeloma not having achieved remission: Secondary | ICD-10-CM | POA: Diagnosis present

## 2024-05-16 DIAGNOSIS — Z5112 Encounter for antineoplastic immunotherapy: Secondary | ICD-10-CM | POA: Insufficient documentation

## 2024-05-16 DIAGNOSIS — Z79620 Long term (current) use of immunosuppressive biologic: Secondary | ICD-10-CM | POA: Diagnosis not present

## 2024-05-16 DIAGNOSIS — Z7189 Other specified counseling: Secondary | ICD-10-CM

## 2024-05-16 LAB — CMP (CANCER CENTER ONLY)
ALT: 8 U/L (ref 0–44)
AST: 18 U/L (ref 15–41)
Albumin: 4.2 g/dL (ref 3.5–5.0)
Alkaline Phosphatase: 58 U/L (ref 38–126)
Anion gap: 11 (ref 5–15)
BUN: 19 mg/dL (ref 8–23)
CO2: 25 mmol/L (ref 22–32)
Calcium: 9.4 mg/dL (ref 8.9–10.3)
Chloride: 107 mmol/L (ref 98–111)
Creatinine: 1.3 mg/dL — ABNORMAL HIGH (ref 0.61–1.24)
GFR, Estimated: 58 mL/min — ABNORMAL LOW
Glucose, Bld: 149 mg/dL — ABNORMAL HIGH (ref 70–99)
Potassium: 3.9 mmol/L (ref 3.5–5.1)
Sodium: 143 mmol/L (ref 135–145)
Total Bilirubin: 0.5 mg/dL (ref 0.0–1.2)
Total Protein: 6.8 g/dL (ref 6.5–8.1)

## 2024-05-16 LAB — CBC WITH DIFFERENTIAL (CANCER CENTER ONLY)
Abs Immature Granulocytes: 0.01 K/uL (ref 0.00–0.07)
Basophils Absolute: 0 K/uL (ref 0.0–0.1)
Basophils Relative: 1 %
Eosinophils Absolute: 0.1 K/uL (ref 0.0–0.5)
Eosinophils Relative: 1 %
HCT: 36.3 % — ABNORMAL LOW (ref 39.0–52.0)
Hemoglobin: 12.2 g/dL — ABNORMAL LOW (ref 13.0–17.0)
Immature Granulocytes: 0 %
Lymphocytes Relative: 17 %
Lymphs Abs: 1.2 K/uL (ref 0.7–4.0)
MCH: 31.2 pg (ref 26.0–34.0)
MCHC: 33.6 g/dL (ref 30.0–36.0)
MCV: 92.8 fL (ref 80.0–100.0)
Monocytes Absolute: 0.5 K/uL (ref 0.1–1.0)
Monocytes Relative: 7 %
Neutro Abs: 5.4 K/uL (ref 1.7–7.7)
Neutrophils Relative %: 74 %
Platelet Count: 212 K/uL (ref 150–400)
RBC: 3.91 MIL/uL — ABNORMAL LOW (ref 4.22–5.81)
RDW: 14.7 % (ref 11.5–15.5)
WBC Count: 7.2 K/uL (ref 4.0–10.5)
nRBC: 0 % (ref 0.0–0.2)

## 2024-05-16 MED ORDER — ACETAMINOPHEN 325 MG PO TABS
650.0000 mg | ORAL_TABLET | Freq: Once | ORAL | Status: AC
  Administered 2024-05-16: 650 mg via ORAL
  Filled 2024-05-16: qty 2

## 2024-05-16 MED ORDER — DARATUMUMAB-HYALURONIDASE-FIHJ 1800-30000 MG-UT/15ML ~~LOC~~ SOLN
1800.0000 mg | Freq: Once | SUBCUTANEOUS | Status: AC
  Administered 2024-05-16: 1800 mg via SUBCUTANEOUS
  Filled 2024-05-16: qty 15

## 2024-05-16 MED ORDER — FAMOTIDINE 20 MG PO TABS
20.0000 mg | ORAL_TABLET | Freq: Once | ORAL | Status: AC
  Administered 2024-05-16: 20 mg via ORAL
  Filled 2024-05-16: qty 1

## 2024-05-16 MED ORDER — DEXAMETHASONE 6 MG PO TABS
12.0000 mg | ORAL_TABLET | Freq: Once | ORAL | Status: AC
  Administered 2024-05-16: 12 mg via ORAL
  Filled 2024-05-16: qty 2

## 2024-05-16 MED ORDER — DIPHENHYDRAMINE HCL 25 MG PO CAPS
50.0000 mg | ORAL_CAPSULE | Freq: Once | ORAL | Status: AC
  Administered 2024-05-16: 50 mg via ORAL
  Filled 2024-05-16: qty 2

## 2024-05-16 NOTE — Patient Instructions (Signed)

## 2024-05-17 ENCOUNTER — Other Ambulatory Visit: Payer: Self-pay

## 2024-05-19 LAB — KAPPA/LAMBDA LIGHT CHAINS
Kappa free light chain: 15 mg/L (ref 3.3–19.4)
Kappa, lambda light chain ratio: 1.39 (ref 0.26–1.65)
Lambda free light chains: 10.8 mg/L (ref 5.7–26.3)

## 2024-05-21 LAB — MULTIPLE MYELOMA PANEL, SERUM
Albumin SerPl Elph-Mcnc: 3.9 g/dL (ref 2.9–4.4)
Albumin/Glob SerPl: 1.6 (ref 0.7–1.7)
Alpha 1: 0.2 g/dL (ref 0.0–0.4)
Alpha2 Glob SerPl Elph-Mcnc: 0.7 g/dL (ref 0.4–1.0)
B-Globulin SerPl Elph-Mcnc: 0.9 g/dL (ref 0.7–1.3)
Gamma Glob SerPl Elph-Mcnc: 0.7 g/dL (ref 0.4–1.8)
Globulin, Total: 2.5 g/dL (ref 2.2–3.9)
IgA: 98 mg/dL (ref 61–437)
IgG (Immunoglobin G), Serum: 853 mg/dL (ref 603–1613)
IgM (Immunoglobulin M), Srm: 26 mg/dL (ref 15–143)
Total Protein ELP: 6.4 g/dL (ref 6.0–8.5)

## 2024-06-04 ENCOUNTER — Inpatient Hospital Stay

## 2024-06-04 ENCOUNTER — Inpatient Hospital Stay: Admitting: Hematology

## 2024-06-13 ENCOUNTER — Inpatient Hospital Stay: Payer: Self-pay

## 2024-06-13 ENCOUNTER — Inpatient Hospital Stay: Payer: Self-pay | Attending: Hematology

## 2024-06-13 VITALS — BP 166/88 | HR 82 | Temp 97.8°F | Resp 16

## 2024-06-13 DIAGNOSIS — Z7189 Other specified counseling: Secondary | ICD-10-CM

## 2024-06-13 DIAGNOSIS — C9000 Multiple myeloma not having achieved remission: Secondary | ICD-10-CM

## 2024-06-13 LAB — CBC WITH DIFFERENTIAL (CANCER CENTER ONLY)
Abs Immature Granulocytes: 0.01 10*3/uL (ref 0.00–0.07)
Basophils Absolute: 0 10*3/uL (ref 0.0–0.1)
Basophils Relative: 1 %
Eosinophils Absolute: 0.1 10*3/uL (ref 0.0–0.5)
Eosinophils Relative: 2 %
HCT: 34.2 % — ABNORMAL LOW (ref 39.0–52.0)
Hemoglobin: 11.4 g/dL — ABNORMAL LOW (ref 13.0–17.0)
Immature Granulocytes: 0 %
Lymphocytes Relative: 25 %
Lymphs Abs: 1.6 10*3/uL (ref 0.7–4.0)
MCH: 31.1 pg (ref 26.0–34.0)
MCHC: 33.3 g/dL (ref 30.0–36.0)
MCV: 93.2 fL (ref 80.0–100.0)
Monocytes Absolute: 0.6 10*3/uL (ref 0.1–1.0)
Monocytes Relative: 9 %
Neutro Abs: 4.2 10*3/uL (ref 1.7–7.7)
Neutrophils Relative %: 63 %
Platelet Count: 183 10*3/uL (ref 150–400)
RBC: 3.67 MIL/uL — ABNORMAL LOW (ref 4.22–5.81)
RDW: 14.6 % (ref 11.5–15.5)
WBC Count: 6.5 10*3/uL (ref 4.0–10.5)
nRBC: 0 % (ref 0.0–0.2)

## 2024-06-13 LAB — CMP (CANCER CENTER ONLY)
ALT: 27 U/L (ref 0–44)
AST: 25 U/L (ref 15–41)
Albumin: 4.2 g/dL (ref 3.5–5.0)
Alkaline Phosphatase: 61 U/L (ref 38–126)
Anion gap: 11 (ref 5–15)
BUN: 16 mg/dL (ref 8–23)
CO2: 28 mmol/L (ref 22–32)
Calcium: 9.5 mg/dL (ref 8.9–10.3)
Chloride: 105 mmol/L (ref 98–111)
Creatinine: 1.21 mg/dL (ref 0.61–1.24)
GFR, Estimated: >60 mL/min
Glucose, Bld: 106 mg/dL — ABNORMAL HIGH (ref 70–99)
Potassium: 4.3 mmol/L (ref 3.5–5.1)
Sodium: 143 mmol/L (ref 135–145)
Total Bilirubin: 0.3 mg/dL (ref 0.0–1.2)
Total Protein: 6.3 g/dL — ABNORMAL LOW (ref 6.5–8.1)

## 2024-06-13 MED ORDER — FAMOTIDINE 20 MG PO TABS
20.0000 mg | ORAL_TABLET | Freq: Once | ORAL | Status: AC
  Administered 2024-06-13: 20 mg via ORAL
  Filled 2024-06-13: qty 1

## 2024-06-13 MED ORDER — DEXAMETHASONE 6 MG PO TABS
12.0000 mg | ORAL_TABLET | Freq: Once | ORAL | Status: AC
  Administered 2024-06-13: 12 mg via ORAL
  Filled 2024-06-13: qty 2

## 2024-06-13 MED ORDER — DARATUMUMAB-HYALURONIDASE-FIHJ 1800-30000 MG-UT/15ML ~~LOC~~ SOLN
1800.0000 mg | Freq: Once | SUBCUTANEOUS | Status: AC
  Administered 2024-06-13: 1800 mg via SUBCUTANEOUS
  Filled 2024-06-13: qty 15

## 2024-06-13 MED ORDER — DIPHENHYDRAMINE HCL 25 MG PO CAPS
50.0000 mg | ORAL_CAPSULE | Freq: Once | ORAL | Status: AC
  Administered 2024-06-13: 50 mg via ORAL
  Filled 2024-06-13: qty 2

## 2024-06-13 MED ORDER — ACETAMINOPHEN 325 MG PO TABS
650.0000 mg | ORAL_TABLET | Freq: Once | ORAL | Status: AC
  Administered 2024-06-13: 650 mg via ORAL
  Filled 2024-06-13: qty 2

## 2024-06-13 NOTE — Progress Notes (Signed)
 Attempted to contact the patient to discuss upcoming appointments and recommended health maintenance screenings for the year.  A Care Connections Specialist has reviewed the patient's chart to identify any gaps in care and outstanding preventive health needs. However, we were unable to reach the patient to review or schedule the necessary follow-up appointments and screenings.  NA  Left message: yes   MyChart message sent: yes   Voicemail was left including callback details and our hours of operation.  Comments:

## 2024-06-13 NOTE — Patient Instructions (Signed)

## 2024-07-02 ENCOUNTER — Inpatient Hospital Stay

## 2024-07-11 ENCOUNTER — Inpatient Hospital Stay: Payer: Self-pay

## 2024-07-11 ENCOUNTER — Inpatient Hospital Stay: Payer: Self-pay | Attending: Hematology
